# Patient Record
Sex: Male | Born: 1950
Health system: Southern US, Academic
[De-identification: ages and names within clinical notes are randomized; demographics above are authoritative.]

## PROBLEM LIST (undated history)

## (undated) ENCOUNTER — Encounter: Attending: Infectious Disease | Primary: Infectious Disease

## (undated) ENCOUNTER — Telehealth

## (undated) ENCOUNTER — Encounter: Attending: Nephrology | Primary: Nephrology

## (undated) ENCOUNTER — Encounter

## (undated) ENCOUNTER — Ambulatory Visit

## (undated) ENCOUNTER — Encounter: Attending: Cardiovascular Disease | Primary: Cardiovascular Disease

## (undated) ENCOUNTER — Ambulatory Visit: Payer: MEDICARE

## (undated) ENCOUNTER — Ambulatory Visit
Attending: Student in an Organized Health Care Education/Training Program | Primary: Student in an Organized Health Care Education/Training Program

## (undated) ENCOUNTER — Ambulatory Visit: Payer: Medicare (Managed Care) | Attending: Nephrology | Primary: Nephrology

## (undated) ENCOUNTER — Ambulatory Visit: Payer: Medicare (Managed Care)

## (undated) ENCOUNTER — Telehealth
Attending: Pharmacist Clinician (PhC)/ Clinical Pharmacy Specialist | Primary: Pharmacist Clinician (PhC)/ Clinical Pharmacy Specialist

## (undated) ENCOUNTER — Ambulatory Visit: Payer: MEDICARE | Attending: Nephrology | Primary: Nephrology

## (undated) ENCOUNTER — Ambulatory Visit: Attending: Infectious Disease | Primary: Infectious Disease

## (undated) ENCOUNTER — Encounter: Attending: Surgery | Primary: Surgery

## (undated) ENCOUNTER — Telehealth: Attending: Infectious Disease | Primary: Infectious Disease

## (undated) ENCOUNTER — Other Ambulatory Visit: Payer: Medicare (Managed Care)

## (undated) ENCOUNTER — Ambulatory Visit: Payer: MEDICARE | Attending: Infectious Disease | Primary: Infectious Disease

## (undated) ENCOUNTER — Ambulatory Visit: Attending: Nephrology | Primary: Nephrology

## (undated) ENCOUNTER — Ambulatory Visit
Payer: MEDICARE | Attending: Student in an Organized Health Care Education/Training Program | Primary: Student in an Organized Health Care Education/Training Program

## (undated) ENCOUNTER — Ambulatory Visit: Payer: MEDICARE | Attending: Cardiovascular Disease | Primary: Cardiovascular Disease

## (undated) ENCOUNTER — Ambulatory Visit: Payer: Medicare (Managed Care) | Attending: Infectious Disease | Primary: Infectious Disease

## (undated) ENCOUNTER — Encounter: Attending: Family | Primary: Family

## (undated) ENCOUNTER — Ambulatory Visit
Payer: Medicare (Managed Care) | Attending: Student in an Organized Health Care Education/Training Program | Primary: Student in an Organized Health Care Education/Training Program

## (undated) ENCOUNTER — Encounter: Payer: Medicare (Managed Care) | Attending: Infectious Disease | Primary: Infectious Disease

## (undated) DIAGNOSIS — I219 Acute myocardial infarction, unspecified: Secondary | ICD-10-CM

## (undated) DIAGNOSIS — I350 Nonrheumatic aortic (valve) stenosis: Secondary | ICD-10-CM

## (undated) DIAGNOSIS — K08109 Complete loss of teeth, unspecified cause, unspecified class: Secondary | ICD-10-CM

## (undated) DIAGNOSIS — Z972 Presence of dental prosthetic device (complete) (partial): Secondary | ICD-10-CM

## (undated) DIAGNOSIS — F32A Depression, unspecified: Secondary | ICD-10-CM

## (undated) DIAGNOSIS — E119 Type 2 diabetes mellitus without complications: Secondary | ICD-10-CM

## (undated) DIAGNOSIS — K219 Gastro-esophageal reflux disease without esophagitis: Secondary | ICD-10-CM

## (undated) DIAGNOSIS — Z21 Asymptomatic human immunodeficiency virus [HIV] infection status: Secondary | ICD-10-CM

## (undated) DIAGNOSIS — M5020 Other cervical disc displacement, unspecified cervical region: Secondary | ICD-10-CM

## (undated) DIAGNOSIS — D649 Anemia, unspecified: Secondary | ICD-10-CM

## (undated) DIAGNOSIS — G709 Myoneural disorder, unspecified: Secondary | ICD-10-CM

## (undated) DIAGNOSIS — F329 Major depressive disorder, single episode, unspecified: Secondary | ICD-10-CM

## (undated) DIAGNOSIS — R011 Cardiac murmur, unspecified: Secondary | ICD-10-CM

## (undated) DIAGNOSIS — B182 Chronic viral hepatitis C: Secondary | ICD-10-CM

## (undated) DIAGNOSIS — K7460 Unspecified cirrhosis of liver: Secondary | ICD-10-CM

## (undated) DIAGNOSIS — I509 Heart failure, unspecified: Secondary | ICD-10-CM

## (undated) DIAGNOSIS — I2510 Atherosclerotic heart disease of native coronary artery without angina pectoris: Secondary | ICD-10-CM

## (undated) DIAGNOSIS — B1920 Unspecified viral hepatitis C without hepatic coma: Secondary | ICD-10-CM

## (undated) DIAGNOSIS — I10 Essential (primary) hypertension: Secondary | ICD-10-CM

## (undated) DIAGNOSIS — N189 Chronic kidney disease, unspecified: Secondary | ICD-10-CM

## (undated) HISTORY — DX: Unspecified viral hepatitis C without hepatic coma: B19.20

## (undated) HISTORY — PX: FRACTURE SURGERY: SHX138

## (undated) HISTORY — PX: NEPHROSTOMY: SHX1014

## (undated) HISTORY — PX: CHOLECYSTECTOMY: SHX55

## (undated) HISTORY — PX: CARDIOVASCULAR STRESS TEST: SHX262

## (undated) HISTORY — DX: Asymptomatic human immunodeficiency virus (hiv) infection status: Z21

## (undated) HISTORY — PX: CLOSED REDUCTION PELVIC FRACTURE: SUR231

## (undated) HISTORY — DX: Nonrheumatic aortic (valve) stenosis: I35.0

## (undated) MED ORDER — BACLOFEN (BULK) TOP: TOPICAL | 0 days

## (undated) MED ORDER — CHOLECALCIFEROL (VITAMIN D3) 25 MCG (1,000 UNIT) TABLET: Freq: Every day | ORAL | 0.00000 days

---
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
## 1983-06-02 HISTORY — PX: BACK SURGERY: SHX140

## 1988-06-01 HISTORY — PX: OTHER SURGICAL HISTORY: SHX169

## 2004-11-25 ENCOUNTER — Emergency Department: Payer: Self-pay | Admitting: General Practice

## 2004-12-16 ENCOUNTER — Emergency Department: Payer: Self-pay | Admitting: Emergency Medicine

## 2005-03-22 ENCOUNTER — Emergency Department: Payer: Self-pay | Admitting: Emergency Medicine

## 2005-07-17 ENCOUNTER — Emergency Department: Payer: Self-pay | Admitting: Unknown Physician Specialty

## 2005-09-08 ENCOUNTER — Other Ambulatory Visit: Payer: Self-pay

## 2005-09-08 ENCOUNTER — Emergency Department: Payer: Self-pay | Admitting: Internal Medicine

## 2005-09-23 ENCOUNTER — Other Ambulatory Visit: Payer: Self-pay

## 2005-09-23 ENCOUNTER — Emergency Department: Payer: Self-pay | Admitting: General Practice

## 2005-12-20 ENCOUNTER — Emergency Department: Payer: Self-pay | Admitting: Emergency Medicine

## 2005-12-30 ENCOUNTER — Ambulatory Visit: Payer: Self-pay | Admitting: Specialist

## 2006-01-08 ENCOUNTER — Ambulatory Visit: Payer: Self-pay | Admitting: Specialist

## 2006-05-01 ENCOUNTER — Emergency Department: Payer: Self-pay | Admitting: Emergency Medicine

## 2006-09-06 ENCOUNTER — Emergency Department: Payer: Self-pay | Admitting: Emergency Medicine

## 2006-10-03 ENCOUNTER — Emergency Department: Payer: Self-pay | Admitting: Emergency Medicine

## 2006-12-29 ENCOUNTER — Emergency Department: Payer: Self-pay | Admitting: Emergency Medicine

## 2007-03-25 ENCOUNTER — Emergency Department: Payer: Self-pay | Admitting: Internal Medicine

## 2007-05-14 ENCOUNTER — Emergency Department: Payer: Self-pay | Admitting: Emergency Medicine

## 2007-06-02 HISTORY — PX: KNEE ARTHROSCOPY: SUR90

## 2007-07-10 ENCOUNTER — Emergency Department: Payer: Self-pay | Admitting: Emergency Medicine

## 2007-08-09 ENCOUNTER — Ambulatory Visit: Payer: Self-pay | Admitting: Internal Medicine

## 2007-09-25 ENCOUNTER — Emergency Department: Payer: Self-pay | Admitting: Emergency Medicine

## 2007-10-05 ENCOUNTER — Observation Stay: Payer: Self-pay | Admitting: Internal Medicine

## 2007-10-05 ENCOUNTER — Other Ambulatory Visit: Payer: Self-pay

## 2007-10-24 ENCOUNTER — Ambulatory Visit: Payer: Self-pay | Admitting: Rheumatology

## 2007-11-04 ENCOUNTER — Ambulatory Visit: Payer: Self-pay | Admitting: Unknown Physician Specialty

## 2007-11-29 ENCOUNTER — Ambulatory Visit: Payer: Self-pay | Admitting: Unknown Physician Specialty

## 2007-12-05 ENCOUNTER — Ambulatory Visit: Payer: Self-pay | Admitting: Unknown Physician Specialty

## 2008-02-14 ENCOUNTER — Emergency Department: Payer: Self-pay | Admitting: Emergency Medicine

## 2008-03-17 ENCOUNTER — Emergency Department: Payer: Self-pay | Admitting: Unknown Physician Specialty

## 2008-05-11 ENCOUNTER — Emergency Department: Payer: Self-pay | Admitting: Emergency Medicine

## 2008-05-23 ENCOUNTER — Ambulatory Visit: Payer: Self-pay | Admitting: Rheumatology

## 2008-08-05 ENCOUNTER — Emergency Department: Payer: Self-pay | Admitting: Emergency Medicine

## 2008-08-06 ENCOUNTER — Ambulatory Visit: Payer: Self-pay | Admitting: Rheumatology

## 2008-10-31 ENCOUNTER — Emergency Department: Payer: Self-pay | Admitting: Emergency Medicine

## 2008-12-02 ENCOUNTER — Emergency Department: Payer: Self-pay | Admitting: Emergency Medicine

## 2008-12-14 ENCOUNTER — Emergency Department: Payer: Self-pay | Admitting: Emergency Medicine

## 2009-01-21 ENCOUNTER — Emergency Department: Payer: Self-pay | Admitting: Unknown Physician Specialty

## 2009-09-27 ENCOUNTER — Emergency Department: Payer: Self-pay | Admitting: Emergency Medicine

## 2011-05-10 ENCOUNTER — Emergency Department: Payer: Self-pay | Admitting: *Deleted

## 2011-08-19 ENCOUNTER — Other Ambulatory Visit: Payer: Self-pay | Admitting: Neurosurgery

## 2011-08-31 ENCOUNTER — Encounter (HOSPITAL_COMMUNITY): Payer: Self-pay | Admitting: Pharmacy Technician

## 2011-09-03 ENCOUNTER — Encounter (HOSPITAL_COMMUNITY)
Admission: RE | Admit: 2011-09-03 | Discharge: 2011-09-03 | Disposition: A | Discharge status: Home / Self Care | Payer: BC Managed Care – PPO | Source: Ambulatory Visit | Attending: Neurosurgery | Admitting: Neurosurgery

## 2011-09-03 ENCOUNTER — Encounter (HOSPITAL_COMMUNITY): Payer: Self-pay

## 2011-09-03 DIAGNOSIS — Z538 Procedure and treatment not carried out for other reasons: Secondary | ICD-10-CM | POA: Insufficient documentation

## 2011-09-03 DIAGNOSIS — Z01812 Encounter for preprocedural laboratory examination: Secondary | ICD-10-CM | POA: Insufficient documentation

## 2011-09-03 HISTORY — DX: Essential (primary) hypertension: I10

## 2011-09-03 HISTORY — DX: Cardiac murmur, unspecified: R01.1

## 2011-09-03 LAB — BASIC METABOLIC PANEL
CO2: 28 mEq/L (ref 19–32)
Calcium: 9.1 mg/dL (ref 8.4–10.5)
Creatinine, Ser: 1.11 mg/dL (ref 0.50–1.35)
GFR calc Af Amer: 82 mL/min — ABNORMAL LOW (ref >90)
GFR calc non Af Amer: 71 mL/min — ABNORMAL LOW (ref >90)
Sodium: 139 mEq/L (ref 135–145)

## 2011-09-03 LAB — CBC
MCV: 88.3 fL (ref 78.0–100.0)
Platelets: 155 10*3/uL (ref 150–400)
RBC: 4.54 MIL/uL (ref 4.22–5.81)
RDW: 13.9 % (ref 11.5–15.5)
WBC: 7.7 10*3/uL (ref 4.0–10.5)

## 2011-09-03 LAB — SURGICAL PCR SCREEN: MRSA, PCR: NEGATIVE

## 2011-09-03 NOTE — Progress Notes (Addendum)
Requested stress test, CXR, EKG from [facility].   Per pt PCP is Dr. [name] Scot. Pt reports BP "runs high", he takes his medications as prescribed, watches salt intake, is working on BP control with his doctor.   @ 1253 Received stress test, EKG, CXR. All from 2009. Will need CXR and EKG DOS.

## 2011-09-03 NOTE — Pre-Procedure Instructions (Signed)
[place]  09/03/2011   Your procedure is scheduled on:  Thursday, April 11  Report to [facility] at 5:30 AM.  Call this number if you have problems the morning of surgery: [phone]   Remember:   Do not eat food:After Midnight.  May have clear liquids: up to 4 Hours before arrival.  Clear liquids include soda, tea, black coffee, apple or grape juice, broth.  Take these medicines the morning of surgery with A SIP OF WATER: Allopurinol, Atenolol, Flexeril    Do not wear jewelry, make-up or nail polish.  Do not wear lotions, powders, or perfumes. You may wear deodorant.  Do not shave 48 hours prior to surgery.  Do not bring valuables to the hospital.  Contacts, dentures or bridgework may not be worn into surgery.  Leave suitcase in the car. After surgery it may be brought to your room.  For patients admitted to the hospital, checkout time is 11:00 AM the day of discharge.   Patients discharged the day of surgery will not be allowed to drive home.  Name and phone number of your driver: NA  Special Instructions: CHG Shower Use Special Wash: 1/2 bottle night before surgery and 1/2 bottle morning of surgery.   Please read over the following fact sheets that you were given: Pain Booklet, Coughing and Deep Breathing and Surgical Site Infection Prevention

## 2011-09-03 NOTE — Progress Notes (Signed)
Left chart for anesthesia review - stress test, elevated BP.

## 2011-09-04 ENCOUNTER — Encounter (HOSPITAL_COMMUNITY): Payer: Self-pay | Admitting: Vascular Surgery

## 2011-09-04 NOTE — Consult Note (Addendum)
Anesthesia:  Patient is a 61 year old male scheduled for a left L4-5 microdiskectomy on 09/10/11.  History includes "benign" murmur (no history of echo), HTN, gout, former smoker.  He is a Firefighter.  His PCP is Dr. [name] Scot at [facility].    Vitals during his PAT showed an elevated BP of 190/100.  Unfortunately, it was not rechecked prior to him leaving.  Labs noted.  CXR and EKG requested from [facility] were > 75 year old.    He had a stress test at [facility] on 10/07/07 that showed normal myocardial perfusion without evidence of ischemia, normal LVEF of 65%.  His EKG then showed NSR, possible LAE, nonspecific T wave abnormality.  I called and spoke with Jared Walker.  His PCP is Dr. Ginette Pitman at [facility].  He says his HTN has been difficult to control, but doesn't think it has been quite as high as it was yesterday.  He is also unsure if he had taken all his antihypertensive medications that day.  (He is on Cardura 4mg  Q HS and atenolol 100mg  and lisinopril 40 mg daily.)  He denies CP, SOB, edema.  He is typically fairly active with his job, but recently sustained an back injury that is limiting his activity.  I've asked him to follow-up with Dr. Linton Ham office early next week to have his BP re-evaluated and treated as indicated.  I told him that he will need an EKG pre-operatively, and to see if one can be done at [facility] and sent to [place] pre-operatively.  If not, and EKG and CXR will need to be done on the day of surgery.  I updated Jared Walker at Dr. Melven Sartorius office.    Addendum: 09/09/11 1230  Patient called me on 09/07/11 to report a BP of 120/112 that was taken at a local drugstore.  I again instructed him to notify his BP since a significantly elevated systolic or diastolic BP reading would most likely lead to his surgery being canceled.  He was seen by Dr. Ginette Pitman on 09/08/11 and Hydralazine 50 mg TID was increased to 100 mg TID (Hydralinze was not actually listed on our medication list.)   He also had an EKG done that showed SR, possible septal infarct (age undetermined), flat or slightly negative T waves somewhat diffuse, but more notable in lateral leads.  Although still somewhat nonspecific, these changes appear new in I, aVL since his last EKG in 2009.  Dr. Ginette Pitman saw Jared Walker again today, 09/09/11, and BP was 176/92.  He recommended that his surgery be postponed until patient's BP was better controlled and an echocardiogram was done. [name] at Dr. Melven Sartorius office was notified.  Reviewed above and EKG with Anesthesiologist Dr. Oletta Lamas. Once cleared medically, plan to proceed.  Defer decision for any further Cardiac testing to Dr. Ginette Pitman based on his upcoming echo results.

## 2011-09-08 NOTE — Progress Notes (Addendum)
Requested latest office notes (from today, 4/9) and EKG from [facility]. Patient sees Dr. Ginette Pitman. Secretary states she will fax notes.

## 2011-09-10 ENCOUNTER — Ambulatory Visit (HOSPITAL_COMMUNITY): Admission: RE | Admit: 2011-09-10 | Payer: BC Managed Care – PPO | Source: Ambulatory Visit | Admitting: Neurosurgery

## 2011-09-10 ENCOUNTER — Encounter (HOSPITAL_COMMUNITY): Admission: RE | Payer: Self-pay | Source: Ambulatory Visit

## 2011-09-10 SURGERY — LUMBAR LAMINECTOMY/DECOMPRESSION MICRODISCECTOMY 1 LEVEL
Anesthesia: General | Laterality: Left

## 2011-09-23 ENCOUNTER — Other Ambulatory Visit: Payer: Self-pay | Admitting: Neurosurgery

## 2011-09-23 ENCOUNTER — Encounter (HOSPITAL_COMMUNITY): Payer: Self-pay | Admitting: Vascular Surgery

## 2011-09-23 NOTE — Consult Note (Signed)
Anesthesia Chart Review: Please refer to my initial consult note on 09/04/11.  He was initially scheduled for a left L4-5 microdiskectomy on 09/10/11, but was canceled due to uncontrolled HTN.  His PCP Dr. Ginette Pitman with [facility] recommended additional antihypertensive medication management and an echocardiogram preoperatively.  These have since been done.  His BP has still been difficult to control.  Pain is thought to be at least somewhat of a contributing factor.  Subsequently, Dr. Ginette Pitman mentioned consideration for admission the night before surgery.  Dr. Vertell Limber spoke with Anesthesiologist Dr. Al Corpus and faxed information from Dr. Ginette Pitman for his review.  History includes HTN, former smoker, gout.  Dr. Linton Ham note also mentions history of Hep C, HIV, hyperglycemia.  He also has mild AS by echo.  Echo from 09/14/11 showed normal LV systolic function, moderate-severe LVH, mild AI with mild calcific AS, mild MR with mild LAE, normal RV systolic function and pressures.  I called and spoke with Mr. Cherry today and reviewed his antihypertensive medications.  His taking atenolol 100mg  Q AM, Cardura 4mg  2 tablets Q HS, hydralazine 50mg  TID, clonidine 0.1mg  BID, lisinopril 40mg  Q AM.  He is taking oxycodone/APAP 5/325mg  BID PRN pain, and Valium and Flexeril at bedtime.  His BP today was 186/108.  By PCP notes, his BP has been ~200/110 but down to 170-180/100 when re-checked.  Of note, he is suppose to be taking hydralazine 100mg  TID according to Dr. Linton Ham noted.  I confirmed that with Dr. Ginette Pitman today and also instructed the patient on this.  I also alerted Dr. Ginette Pitman that patient is taking Cardura 8mg  at bedtime (versus 4mg  that is listed on his OV note).  Dr. Ginette Pitman was okay with this.    I reviewed above and the records faxed with Dr. Al Corpus.  At this point, he feels the patient could still come in as an out-patient but will need to take all his anti-hypertensives that morning (those that he would normally take in  the morning) including his ACEI and be compliant with his evening medications as well.  He was also instructed to take his pain medication that morning.  Anesthesia will defer decision to admit pre-operatively to Dr. Vertell Limber.  (I relayed this to Jeralyn Bennett at Dr. Melven Sartorius office.)   He will need a CXR and labs repeated pre-operatively.  I've asked the nurses to include a CMET and coags due to his history of Hep C (according to PCP records, as patient did not report this during his last PAT visit.)

## 2011-09-24 ENCOUNTER — Encounter (HOSPITAL_COMMUNITY): Payer: Self-pay | Admitting: *Deleted

## 2011-09-24 MED ORDER — CEFAZOLIN SODIUM-DEXTROSE 2-3 GM-% IV SOLR
2.0000 g | INTRAVENOUS | Status: DC
  Filled 2011-09-24: qty 50

## 2011-09-24 NOTE — Progress Notes (Signed)
I  Called Dr. Melven Sartorius office, on April 24th and asked scheduler for a consent  order- the one in computer was cancelled.

## 2011-09-25 ENCOUNTER — Encounter (HOSPITAL_COMMUNITY): Payer: Self-pay | Admitting: Vascular Surgery

## 2011-09-25 ENCOUNTER — Encounter (HOSPITAL_COMMUNITY): Payer: Self-pay | Admitting: *Deleted

## 2011-09-25 ENCOUNTER — Encounter (HOSPITAL_COMMUNITY)
Admission: RE | Disposition: A | Discharge status: Home Health | Payer: Self-pay | Source: Ambulatory Visit | Attending: Neurosurgery

## 2011-09-25 ENCOUNTER — Ambulatory Visit (HOSPITAL_COMMUNITY): Payer: BC Managed Care – PPO

## 2011-09-25 ENCOUNTER — Inpatient Hospital Stay (HOSPITAL_COMMUNITY)
Admission: RE | Admit: 2011-09-25 | Discharge: 2011-10-02 | DRG: 758 | Disposition: A | Discharge status: Home Health | Payer: BC Managed Care – PPO | Source: Ambulatory Visit | Attending: Neurosurgery | Admitting: Neurosurgery

## 2011-09-25 ENCOUNTER — Ambulatory Visit (HOSPITAL_COMMUNITY): Payer: BC Managed Care – PPO | Admitting: Vascular Surgery

## 2011-09-25 DIAGNOSIS — M5126 Other intervertebral disc displacement, lumbar region: Principal | ICD-10-CM | POA: Diagnosis present

## 2011-09-25 DIAGNOSIS — IMO0002 Reserved for concepts with insufficient information to code with codable children: Secondary | ICD-10-CM | POA: Diagnosis present

## 2011-09-25 DIAGNOSIS — Z21 Asymptomatic human immunodeficiency virus [HIV] infection status: Secondary | ICD-10-CM

## 2011-09-25 DIAGNOSIS — M47817 Spondylosis without myelopathy or radiculopathy, lumbosacral region: Secondary | ICD-10-CM | POA: Diagnosis present

## 2011-09-25 DIAGNOSIS — B1920 Unspecified viral hepatitis C without hepatic coma: Secondary | ICD-10-CM

## 2011-09-25 HISTORY — DX: Chronic kidney disease, unspecified: N18.9

## 2011-09-25 HISTORY — DX: Asymptomatic human immunodeficiency virus (hiv) infection status: Z21

## 2011-09-25 HISTORY — DX: Unspecified viral hepatitis C without hepatic coma: B19.20

## 2011-09-25 HISTORY — PX: LUMBAR LAMINECTOMY/DECOMPRESSION MICRODISCECTOMY: SHX5026

## 2011-09-25 LAB — CBC
MCH: 30.6 pg (ref 26.0–34.0)
MCHC: 35.3 g/dL (ref 30.0–36.0)
MCV: 86.9 fL (ref 78.0–100.0)
Platelets: 162 10*3/uL (ref 150–400)
RBC: 4.34 MIL/uL (ref 4.22–5.81)

## 2011-09-25 LAB — APTT: aPTT: 29 seconds (ref 24–37)

## 2011-09-25 LAB — COMPREHENSIVE METABOLIC PANEL
Alkaline Phosphatase: 93 U/L (ref 39–117)
BUN: 8 mg/dL (ref 6–23)
CO2: 24 mEq/L (ref 19–32)
GFR calc Af Amer: 76 mL/min — ABNORMAL LOW (ref >90)
GFR calc non Af Amer: 65 mL/min — ABNORMAL LOW (ref >90)
Glucose, Bld: 104 mg/dL — ABNORMAL HIGH (ref 70–99)
Potassium: 4 mEq/L (ref 3.5–5.1)
Total Protein: 7.1 g/dL (ref 6.0–8.3)

## 2011-09-25 LAB — SURGICAL PCR SCREEN: MRSA, PCR: NEGATIVE

## 2011-09-25 LAB — PROTIME-INR: Prothrombin Time: 13.9 seconds (ref 11.6–15.2)

## 2011-09-25 SURGERY — LUMBAR LAMINECTOMY/DECOMPRESSION MICRODISCECTOMY
Anesthesia: General | Site: Back | Laterality: Left | Wound class: Clean

## 2011-09-25 MED ORDER — HYDROCODONE-ACETAMINOPHEN 5-325 MG PO TABS: 1.0000 | ORAL_TABLET | ORAL | Status: DC | PRN

## 2011-09-25 MED ORDER — ONDANSETRON HCL 4 MG/2ML IJ SOLN: 4.0000 mg | Freq: Once | INTRAMUSCULAR | Status: DC | PRN

## 2011-09-25 MED ORDER — DIAZEPAM 5 MG PO TABS
5.0000 mg | ORAL_TABLET | Freq: Four times a day (QID) | ORAL | Status: DC | PRN
  Administered 2011-09-26 – 2011-10-01 (×8): 5 mg via ORAL
  Filled 2011-09-25 (×8): qty 1

## 2011-09-25 MED ORDER — HYDRALAZINE HCL 50 MG PO TABS
50.0000 mg | ORAL_TABLET | Freq: Three times a day (TID) | ORAL | Status: DC
  Administered 2011-09-25 – 2011-10-02 (×20): 50 mg via ORAL
  Filled 2011-09-25 (×23): qty 1

## 2011-09-25 MED ORDER — ATENOLOL 100 MG PO TABS
100.0000 mg | ORAL_TABLET | Freq: Every day | ORAL | Status: DC
  Administered 2011-09-26 – 2011-10-02 (×7): 100 mg via ORAL
  Filled 2011-09-25 (×7): qty 1

## 2011-09-25 MED ORDER — FENTANYL CITRATE 0.05 MG/ML IJ SOLN
INTRAMUSCULAR | Status: DC | PRN
  Administered 2011-09-25 (×3): 50 ug via INTRAVENOUS

## 2011-09-25 MED ORDER — SODIUM CHLORIDE 0.9 % IV SOLN
INTRAVENOUS | Status: AC
  Filled 2011-09-25: qty 500

## 2011-09-25 MED ORDER — PROPOFOL 10 MG/ML IV EMUL
INTRAVENOUS | Status: DC | PRN
  Administered 2011-09-25: 200 mg via INTRAVENOUS

## 2011-09-25 MED ORDER — HYDROMORPHONE HCL PF 1 MG/ML IJ SOLN
INTRAMUSCULAR | Status: AC
  Administered 2011-09-25: 0.5 mg via INTRAVENOUS
  Filled 2011-09-25: qty 1

## 2011-09-25 MED ORDER — MORPHINE SULFATE 10 MG/ML IJ SOLN: 0.0500 mg/kg | INTRAMUSCULAR | Status: DC | PRN

## 2011-09-25 MED ORDER — GLYCOPYRROLATE 0.2 MG/ML IJ SOLN
INTRAMUSCULAR | Status: DC | PRN
  Administered 2011-09-25: .4 mg via INTRAVENOUS

## 2011-09-25 MED ORDER — SODIUM CHLORIDE 0.9 % IJ SOLN
3.0000 mL | Freq: Two times a day (BID) | INTRAMUSCULAR | Status: DC
  Administered 2011-09-26 – 2011-10-02 (×13): 3 mL via INTRAVENOUS

## 2011-09-25 MED ORDER — ACETAMINOPHEN 325 MG PO TABS: 650.0000 mg | ORAL_TABLET | ORAL | Status: DC | PRN

## 2011-09-25 MED ORDER — BACITRACIN 50000 UNITS IM SOLR
INTRAMUSCULAR | Status: AC
  Filled 2011-09-25: qty 1

## 2011-09-25 MED ORDER — KCL IN DEXTROSE-NACL 20-5-0.45 MEQ/L-%-% IV SOLN
INTRAVENOUS | Status: DC
  Administered 2011-09-25: 22:00:00 via INTRAVENOUS
  Filled 2011-09-25 (×13): qty 1000

## 2011-09-25 MED ORDER — HYDROMORPHONE HCL PF 1 MG/ML IJ SOLN
0.2500 mg | INTRAMUSCULAR | Status: DC | PRN
  Administered 2011-09-25 (×4): 0.5 mg via INTRAVENOUS

## 2011-09-25 MED ORDER — LIDOCAINE HCL (CARDIAC) 20 MG/ML IV SOLN
INTRAVENOUS | Status: DC | PRN
  Administered 2011-09-25: 40 mg via INTRAVENOUS
  Administered 2011-09-25: 80 mg via INTRAVENOUS

## 2011-09-25 MED ORDER — SODIUM CHLORIDE 0.9 % IR SOLN
Status: DC | PRN
  Administered 2011-09-25: 16:00:00

## 2011-09-25 MED ORDER — PHENYLEPHRINE HCL 10 MG/ML IJ SOLN
INTRAMUSCULAR | Status: DC | PRN
  Administered 2011-09-25: 80 ug via INTRAVENOUS
  Administered 2011-09-25: 40 ug via INTRAVENOUS
  Administered 2011-09-25 (×7): 80 ug via INTRAVENOUS

## 2011-09-25 MED ORDER — METHYLPREDNISOLONE ACETATE 80 MG/ML IJ SUSP
INTRAMUSCULAR | Status: DC | PRN
  Administered 2011-09-25: 80 mg via INTRALESIONAL

## 2011-09-25 MED ORDER — CEFAZOLIN SODIUM 1-5 GM-% IV SOLN
INTRAVENOUS | Status: DC | PRN
  Administered 2011-09-25: 2 g via INTRAVENOUS

## 2011-09-25 MED ORDER — ONDANSETRON HCL 4 MG/2ML IJ SOLN
INTRAMUSCULAR | Status: DC | PRN
  Administered 2011-09-25: 4 mg via INTRAVENOUS

## 2011-09-25 MED ORDER — OXYCODONE-ACETAMINOPHEN 5-325 MG PO TABS
1.0000 | ORAL_TABLET | Freq: Two times a day (BID) | ORAL | Status: DC
  Administered 2011-09-25 – 2011-10-02 (×14): 1 via ORAL
  Filled 2011-09-25 (×3): qty 1
  Filled 2011-09-25: qty 2
  Filled 2011-09-25 (×12): qty 1

## 2011-09-25 MED ORDER — DOCUSATE SODIUM 100 MG PO CAPS
100.0000 mg | ORAL_CAPSULE | Freq: Two times a day (BID) | ORAL | Status: DC
  Administered 2011-09-25 – 2011-10-02 (×14): 100 mg via ORAL
  Filled 2011-09-25 (×12): qty 1

## 2011-09-25 MED ORDER — ALLOPURINOL 100 MG PO TABS
100.0000 mg | ORAL_TABLET | Freq: Every day | ORAL | Status: DC
  Administered 2011-09-26 – 2011-10-02 (×7): 100 mg via ORAL
  Filled 2011-09-25 (×7): qty 1

## 2011-09-25 MED ORDER — PHENOL 1.4 % MT LIQD: 1.0000 | OROMUCOSAL | Status: DC | PRN

## 2011-09-25 MED ORDER — ALLOPURINOL 300 MG PO TABS
300.0000 mg | ORAL_TABLET | Freq: Every day | ORAL | Status: DC
  Administered 2011-09-26 – 2011-10-02 (×7): 300 mg via ORAL
  Filled 2011-09-25 (×7): qty 1

## 2011-09-25 MED ORDER — DOXAZOSIN MESYLATE 8 MG PO TABS
8.0000 mg | ORAL_TABLET | Freq: Every day | ORAL | Status: DC
  Administered 2011-09-25 – 2011-10-01 (×7): 8 mg via ORAL
  Filled 2011-09-25 (×8): qty 1

## 2011-09-25 MED ORDER — LISINOPRIL 40 MG PO TABS
40.0000 mg | ORAL_TABLET | Freq: Every day | ORAL | Status: DC
Start: 2011-09-26 — End: 2011-10-02
  Administered 2011-09-26 – 2011-10-02 (×7): 40 mg via ORAL
  Filled 2011-09-25 (×7): qty 1

## 2011-09-25 MED ORDER — LACTATED RINGERS IV SOLN
INTRAVENOUS | Status: DC | PRN
  Administered 2011-09-25 (×2): via INTRAVENOUS

## 2011-09-25 MED ORDER — BUPIVACAINE HCL (PF) 0.5 % IJ SOLN
INTRAMUSCULAR | Status: DC | PRN
  Administered 2011-09-25: 10 mL

## 2011-09-25 MED ORDER — CEFAZOLIN SODIUM 1-5 GM-% IV SOLN
1.0000 g | Freq: Three times a day (TID) | INTRAVENOUS | Status: AC
  Administered 2011-09-25 – 2011-09-26 (×2): 1 g via INTRAVENOUS
  Filled 2011-09-25 (×2): qty 50

## 2011-09-25 MED ORDER — MIDAZOLAM HCL 5 MG/5ML IJ SOLN
INTRAMUSCULAR | Status: DC | PRN
  Administered 2011-09-25: 2 mg via INTRAVENOUS

## 2011-09-25 MED ORDER — LIDOCAINE-EPINEPHRINE 1 %-1:100000 IJ SOLN
INTRAMUSCULAR | Status: DC | PRN
  Administered 2011-09-25: 10 mL

## 2011-09-25 MED ORDER — FENTANYL CITRATE 0.05 MG/ML IJ SOLN
INTRAMUSCULAR | Status: AC
  Filled 2011-09-25: qty 2

## 2011-09-25 MED ORDER — DIAZEPAM 5 MG PO TABS: 5.0000 mg | ORAL_TABLET | Freq: Four times a day (QID) | ORAL | Status: DC | PRN

## 2011-09-25 MED ORDER — 0.9 % SODIUM CHLORIDE (POUR BTL) OPTIME
TOPICAL | Status: DC | PRN
  Administered 2011-09-25: 1000 mL

## 2011-09-25 MED ORDER — OXYCODONE-ACETAMINOPHEN 5-325 MG PO TABS
1.0000 | ORAL_TABLET | ORAL | Status: DC | PRN
  Administered 2011-09-26 (×3): 2 via ORAL
  Administered 2011-09-26: 1 via ORAL
  Administered 2011-09-27 – 2011-09-30 (×7): 2 via ORAL
  Filled 2011-09-25 (×3): qty 2
  Filled 2011-09-25: qty 1
  Filled 2011-09-25 (×6): qty 2

## 2011-09-25 MED ORDER — ONDANSETRON HCL 4 MG/2ML IJ SOLN: 4.0000 mg | INTRAMUSCULAR | Status: DC | PRN

## 2011-09-25 MED ORDER — FENTANYL CITRATE 0.05 MG/ML IJ SOLN
INTRAMUSCULAR | Status: DC | PRN
  Administered 2011-09-25: 100 ug via INTRAVENOUS

## 2011-09-25 MED ORDER — EFAVIRENZ-EMTRICITAB-TENOFOVIR 600-200-300 MG PO TABS
1.0000 | ORAL_TABLET | Freq: Every day | ORAL | Status: DC
  Administered 2011-09-25 – 2011-10-01 (×7): 1 via ORAL
  Filled 2011-09-25 (×8): qty 1

## 2011-09-25 MED ORDER — SODIUM CHLORIDE 0.9 % IV SOLN: 250.0000 mL | INTRAVENOUS | Status: DC

## 2011-09-25 MED ORDER — HEMOSTATIC AGENTS (NO CHARGE) OPTIME
TOPICAL | Status: DC | PRN
  Administered 2011-09-25: 1 via TOPICAL

## 2011-09-25 MED ORDER — MORPHINE SULFATE 4 MG/ML IJ SOLN
1.0000 mg | INTRAMUSCULAR | Status: DC | PRN
  Administered 2011-09-25: 2 mg via INTRAVENOUS
  Administered 2011-09-26 – 2011-09-29 (×7): 4 mg via INTRAVENOUS
  Filled 2011-09-25 (×8): qty 1

## 2011-09-25 MED ORDER — HYDROCODONE-ACETAMINOPHEN 5-325 MG PO TABS
1.0000 | ORAL_TABLET | ORAL | Status: DC | PRN
  Administered 2011-09-27 – 2011-10-01 (×4): 2 via ORAL
  Filled 2011-09-25 (×5): qty 2

## 2011-09-25 MED ORDER — MENTHOL 3 MG MT LOZG: 1.0000 | LOZENGE | OROMUCOSAL | Status: DC | PRN

## 2011-09-25 MED ORDER — ACETAMINOPHEN 650 MG RE SUPP: 650.0000 mg | RECTAL | Status: DC | PRN

## 2011-09-25 MED ORDER — CYCLOBENZAPRINE HCL 10 MG PO TABS
10.0000 mg | ORAL_TABLET | Freq: Every day | ORAL | Status: DC
  Administered 2011-09-25 – 2011-10-02 (×8): 10 mg via ORAL
  Filled 2011-09-25 (×12): qty 1

## 2011-09-25 MED ORDER — ROCURONIUM BROMIDE 100 MG/10ML IV SOLN
INTRAVENOUS | Status: DC | PRN
  Administered 2011-09-25: 50 mg via INTRAVENOUS

## 2011-09-25 MED ORDER — NEOSTIGMINE METHYLSULFATE 1 MG/ML IJ SOLN
INTRAMUSCULAR | Status: DC | PRN
  Administered 2011-09-25: 3 mg via INTRAVENOUS

## 2011-09-25 MED ORDER — SODIUM CHLORIDE 0.9 % IJ SOLN: 3.0000 mL | INTRAMUSCULAR | Status: DC | PRN

## 2011-09-25 MED ORDER — EPHEDRINE SULFATE 50 MG/ML IJ SOLN
INTRAMUSCULAR | Status: DC | PRN
  Administered 2011-09-25 (×4): 10 mg via INTRAVENOUS

## 2011-09-25 MED ORDER — MUPIROCIN 2 % EX OINT
TOPICAL_OINTMENT | CUTANEOUS | Status: AC
Start: 2011-09-25 — End: 2011-09-25
  Administered 2011-09-25: 1 via NASAL
  Filled 2011-09-25: qty 22

## 2011-09-25 MED ORDER — THROMBIN 5000 UNITS EX SOLR
CUTANEOUS | Status: DC | PRN
  Administered 2011-09-25 (×2): 5000 [IU] via TOPICAL

## 2011-09-25 SURGICAL SUPPLY — 56 items
BAG DECANTER FOR FLEXI CONT (MISCELLANEOUS) ×2 IMPLANT
BENZOIN TINCTURE PRP APPL 2/3 (GAUZE/BANDAGES/DRESSINGS) IMPLANT
BIT DRILL NEURO 2X3.1 SFT TUCH (MISCELLANEOUS) ×1 IMPLANT
BLADE SURG ROTATE 9660 (MISCELLANEOUS) IMPLANT
BUR ROUND FLUTED 5 RND (BURR) ×2 IMPLANT
CANISTER SUCTION 2500CC (MISCELLANEOUS) ×2 IMPLANT
CLOTH BEACON ORANGE TIMEOUT ST (SAFETY) ×2 IMPLANT
CONT SPEC 4OZ CLIKSEAL STRL BL (MISCELLANEOUS) ×2 IMPLANT
DERMABOND ADVANCED (GAUZE/BANDAGES/DRESSINGS) ×1
DERMABOND ADVANCED .7 DNX12 (GAUZE/BANDAGES/DRESSINGS) ×1 IMPLANT
DRAPE LAPAROTOMY 100X72X124 (DRAPES) ×2 IMPLANT
DRAPE MICROSCOPE LEICA (MISCELLANEOUS) ×2 IMPLANT
DRAPE POUCH INSTRU U-SHP 10X18 (DRAPES) ×2 IMPLANT
DRESSING TELFA 8X3 (GAUZE/BANDAGES/DRESSINGS) IMPLANT
DRILL NEURO 2X3.1 SOFT TOUCH (MISCELLANEOUS) ×2
DURAPREP 26ML APPLICATOR (WOUND CARE) ×2 IMPLANT
ELECT REM PT RETURN 9FT ADLT (ELECTROSURGICAL) ×2
ELECTRODE REM PT RTRN 9FT ADLT (ELECTROSURGICAL) ×1 IMPLANT
GAUZE SPONGE 4X4 16PLY XRAY LF (GAUZE/BANDAGES/DRESSINGS) IMPLANT
GLOVE BIO SURGEON STRL SZ8 (GLOVE) ×2 IMPLANT
GLOVE BIOGEL PI IND STRL 7.5 (GLOVE) ×1 IMPLANT
GLOVE BIOGEL PI IND STRL 8 (GLOVE) ×1 IMPLANT
GLOVE BIOGEL PI IND STRL 8.5 (GLOVE) ×2 IMPLANT
GLOVE BIOGEL PI INDICATOR 7.5 (GLOVE) ×1
GLOVE BIOGEL PI INDICATOR 8 (GLOVE) ×1
GLOVE BIOGEL PI INDICATOR 8.5 (GLOVE) ×2
GLOVE ECLIPSE 7.5 STRL STRAW (GLOVE) ×6 IMPLANT
GLOVE ECLIPSE 8.0 STRL XLNG CF (GLOVE) ×2 IMPLANT
GLOVE ECLIPSE 8.5 STRL (GLOVE) ×2 IMPLANT
GLOVE EXAM NITRILE LRG STRL (GLOVE) IMPLANT
GLOVE EXAM NITRILE MD LF STRL (GLOVE) IMPLANT
GLOVE EXAM NITRILE XL STR (GLOVE) IMPLANT
GLOVE EXAM NITRILE XS STR PU (GLOVE) IMPLANT
GOWN BRE IMP SLV AUR LG STRL (GOWN DISPOSABLE) IMPLANT
GOWN BRE IMP SLV AUR XL STRL (GOWN DISPOSABLE) ×4 IMPLANT
GOWN STRL REIN 2XL LVL4 (GOWN DISPOSABLE) ×2 IMPLANT
KIT BASIN OR (CUSTOM PROCEDURE TRAY) ×2 IMPLANT
KIT ROOM TURNOVER OR (KITS) ×2 IMPLANT
NEEDLE HYPO 18GX1.5 BLUNT FILL (NEEDLE) ×2 IMPLANT
NEEDLE HYPO 25X1 1.5 SAFETY (NEEDLE) ×2 IMPLANT
NS IRRIG 1000ML POUR BTL (IV SOLUTION) ×2 IMPLANT
PACK LAMINECTOMY NEURO (CUSTOM PROCEDURE TRAY) ×2 IMPLANT
PAD ARMBOARD 7.5X6 YLW CONV (MISCELLANEOUS) ×6 IMPLANT
SPONGE GAUZE 4X4 12PLY (GAUZE/BANDAGES/DRESSINGS) IMPLANT
SPONGE SURGIFOAM ABS GEL SZ50 (HEMOSTASIS) ×2 IMPLANT
STAPLER SKIN PROX WIDE 3.9 (STAPLE) IMPLANT
STRIP CLOSURE SKIN 1/2X4 (GAUZE/BANDAGES/DRESSINGS) IMPLANT
SUT VIC AB 0 CT1 18XCR BRD8 (SUTURE) ×1 IMPLANT
SUT VIC AB 0 CT1 8-18 (SUTURE) ×1
SUT VIC AB 2-0 CT1 18 (SUTURE) ×2 IMPLANT
SUT VIC AB 3-0 SH 8-18 (SUTURE) ×2 IMPLANT
SYR 20CC LL (SYRINGE) ×2 IMPLANT
SYR 5ML LL (SYRINGE) ×2 IMPLANT
TOWEL OR 17X24 6PK STRL BLUE (TOWEL DISPOSABLE) ×2 IMPLANT
TOWEL OR 17X26 10 PK STRL BLUE (TOWEL DISPOSABLE) ×2 IMPLANT
WATER STERILE IRR 1000ML POUR (IV SOLUTION) ×2 IMPLANT

## 2011-09-25 NOTE — Preoperative (Signed)
Beta Blockers   Reason not to administer Beta Blockers:Not Applicable 

## 2011-09-25 NOTE — H&P (Signed)
Less Chou [id number] DOB: 06-21-1950  08/19/2011: Ilda Basset comes in today. He has been using physical therapy and TENS Unit and traction for his low back. He has pain in the left side of his neck, down his left arm that he says began yesterday. He had an injection by Dr. Maryjean Ka on 3/14 which he said did not help. He has been taking Percocet 10/325 1 b.i.d. and Flexeril 5 mg. q.h.s. Refills of medications were written today of Flexeril 10 mg. q.8.h. as needed dispensed #90, Percocet 10/325 q.8.h.  I reviewed Mr. Sybrant imaging studies in detail with him and his wife. I believe the basis for his significant left leg pain relates to his disc herniation and stenosis at L4-5 on the left. At this point, since he did not get any significant relief with injection therapy, it is reasonable to proceed with surgical intervention and this will consist of left L4-5 foraminotomy and microdiskectomy. He wishes to proceed with surgery. We will set this up in the near future. Risks and benefits were discussed and he wishes to process.  Marchia Meiers. Vertell Limber, M.D./gde cc: Dr. 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 He describes left-sided low-back pain going into his left leg which he describes as 10/10 in severity. He says this has been going on for the last two months. He has a history of a right pelvic fracture with major reconstructive surgery some 25 years ago. He also has an extensive Past Medical History positive for gallstones, high blood pressure, as well as history of hepatitis-C, and HIV positive, nephrolithiasis, hyperglycemia and gout. In addition to pelvic fracture for which he had surgery, he had a right knee arthroscopy for a torn meniscus and gout. He had pain in the left leg  which did not improve with Meloxicam and Valium.  REVIEW OF SYSTEMS: A detailed Review of Systems sheet was reviewed with the patient. Pertinent positives include: Eyes: He wears glasses. Cardiovascular: high blood pressure, heart murmur, leg pain while walking. GU: kidney stones. Musculoskeletal: Leg weakness, back pain, leg pain.  PAST MEDICAL HISTORY:   Current Medical Conditions: He also has an extensive Past Medical History positive for gallstones, high blood pressure, as well as history of hepatitis-C, and HIV positive, nephrolithiasis, hyperglycemia and gout. In addition to pelvic fracture for which he had surgery, he had a right knee arthroscopy for a torn meniscus and gout. He had pain in the left leg which did not improve with Meloxicam and Valium.   Prior Operations and Hospitalizations: As previously described.   Medications and Allergies: He is currently taking Allopurinol 100 mg. 2 tablets by mouth q.d. as needed, Allopurinol 300 mg. q.d., Clonidine .1 mg. q.h.s., Colchicine .6 mg. q.o.d. as directed, Atenolol 100 mg. once q.d., Lisinopril 40 mg. once q.d., Doxazosin 4 mg. q.d., Valium 5 mg. q.8.h. p.r.n., Meloxicam 15 mg. one tablet by mouth every day with food. He notes allergies to Adalat-CC, Percocet which caused GI upset, Tramadol unspecified.   Height and Weight: 5'11" tall and 198 pounds.  FAMILY HISTORY: Both parents are deceased cause unspecified.  SOCIAL HISTORY: He currently is a nonsmoker and nondrinker. No history of substance abuse.  DIAGNOSTIC STUDIES: Mr. Zecher had an MRI of his lumbar spine which was performed through [facility] on 07/17/2011 which shows that  at L3-4 there is a disc bulge and moderate right and severe left neural foraminal stenosis. At L4-5, there appears to be moderate biforaminal stenosis and at L5-S1 level there appears to be ligamentous thickening and facet arthropathy resulting in mild central stenosis in light of moderate biforaminal stenosis.   PHYSICAL EXAMINATION:   General Appearance: Mr. Jaggers is a pleasant and cooperative man in no acute distress.   Blood Pressure, Pulse, Respirations: 130/80. Heart rate 72 and regular. Respirations 16.   HEENT - normocephalic, atraumatic. The pupils are equal, round and reactive to light. The extraocular muscles are intact. Sclerae - white. Conjunctiva - pink. Oropharynx benign. Uvula midline.   Neck - there are no masses, meningismus, deformities, tracheal deviation, jugular vein distention or carotid bruits. There is normal cervical range of motion. Spurlings' test is negative without reproducible radicular pain turning the patient's head to either side. Lhermitte's sign is not present with axial compression.   Respiratory - there is normal respiratory effort with good intercostal function. Lungs are clear to auscultation. There are no rales, rhonchi or wheezes.   Cardiovascular - the heart has regular rate and rhythm to auscultation. No murmurs are appreciated. There is no extremity edema, cyanosis or clubbing. There are palpable pedal pulses.   Abdomen - soft, nontender, no hepatosplenomegaly appreciated or masses. There are active bowel sounds. No guarding or rebound.   Musculoskeletal Examination - he has left sciatic notch discomfort. He gets relief with leaning forward. He is able to stand on his heels and toes. He has an antalgic gait favoring his left lower extremity. He has a positive straight leg raising on the left at 30 degrees.  NEUROLOGICAL EXAMINATION: The patient is oriented to time, person and place and has good recall of both recent and remote memory with normal attention span and concentration. The patient speaks with clear and fluent speech and exhibits normal language function and appropriate fund of knowledge.   Cranial Nerve Examination - pupils are equal, round and reactive to light. Extraocular movements are full. Visual fields are full to confrontational testing.  Facial sensation and facial movement are symmetric and intact. Hearing is intact to finger rub. Palate is upgoing. Shoulder shrug is symmetric. Tongue protrudes in the midline.   Motor Examination - motor strength is 5/5 in the bilateral deltoids, biceps, triceps, handgrips, wrist extensors, interosseous. In the lower extremities motor strength is 5/5 in hip flexion, extension, quadriceps, hamstrings, plantar flexion, dorsiflexion and extensor hallucis longus.   Sensory Examination - he notes increased pin sensation in the left L4 distribution.   Deep Tendon Reflexes - 2 in the biceps, triceps, and brachioradialis, 2 at the right knee, trace to the left, 2 in the right ankle and 1 at the left ankle. The great toes are downgoing to plantar stimulation.   Cerebellar Examination - normal coordination in upper and lower extremities and normal rapid alternating movements. Romberg test is negative.  IMPRESSION AND RECOMMENDATIONS: Yandriel Behm is a 61 year old man with low back and left leg pain. I have suggested that he come out of work, that he try to have a left L5 and L4 nerve blocks by Dr. Maryjean Ka and that he undergo a course of physical therapy through [facility] for low back pain with leg pain two times a week for six weeks. He will then come back to see me in a month. Hopefully with these interventions he will be able to avoid surgical intervention.  [facility]  Marchia Meiers. Vertell Limber, M.D. JDS:gde cc: Dr. Tracie Harrier  Jeralyn Bennett Surgical Scheduler [facility]  [place]. [address], [address] [place], [place] [address] [phone]

## 2011-09-25 NOTE — Op Note (Signed)
09/25/2011  5:19 PM  PATIENT:  Jared Walker  61 y.o. male  PRE-OPERATIVE DIAGNOSIS:  Lumbar hnp without myelopathy, Lumbar radiculopathy, lumbar spondylosis, DDD  POST-OPERATIVE DIAGNOSIS:   Lumbar hnp without myelopathy, Lumbar radiculopathy, lumbar spondylosis, DDD  PROCEDURE:  Procedure(s) (LRB): LUMBAR LAMINECTOMY/DECOMPRESSION MICRODISCECTOMY  L 4 -5 (Left)  SURGEON:  Surgeon(s) and Role:    * Erline Levine, MD - Primary    * Kristeen Miss, MD - Assisting  PHYSICIAN ASSISTANT:   ASSISTANTS: none   ANESTHESIA:   general  EBL:  Total I/O In: 1000 [I.V.:1000] Out: 120 [Blood:120]  BLOOD ADMINISTERED:none  DRAINS: none   LOCAL MEDICATIONS USED:  LIDOCAINE   SPECIMEN:  No Specimen  DISPOSITION OF SPECIMEN:  N/A  COUNTS:  YES  TOURNIQUET:  * No tourniquets in log *  DICTATION: DICTATION: Patient has a large L4-5 disc rupture on the left with significant leftt leg pain and weakness. It was elected to take him to surgery for left L4-5 microdiscectomy.  Procedure: Patient was brought to the operating room and following the smooth and uncomplicated induction of general endotracheal anesthesia he was placed in a prone position on the Wilson frame. Low back was prepped and draped in the usual sterile fashion with DuraPrep. Area of planned incision was infiltrated with local lidocaine. Incision was made in the midline and carried to the lumbodorsal fascia which was incised on the left side of midline. Subperiosteal dissection was performed exposing what was felt to be L45 level. Intraoperative x-ray demonstrated marker probes at L4-5. A hemi-semi-laminectomy of L4 was performed a high-speed drill and completed with Kerrison rongeurs and a generous foraminotomy was performed overlying the superior aspect of the L5 lamina. Ligamentum flavum was detached and removed in a piecemeal fashion and the L5 nerve root was decompressed laterally with removal of the superior aspect of the  facet and ligamentum causing nerve root compression. The microscope was brought into the field and the L5 nerve root was mobilized medially. This exposed a large amount of herniated disc material with disruption of the annulus. Multiple fragments were removed and these extended into the interspace which appeared to be quite soft with a disrupted annulus overlying the interspace. As a result it was elected to further decompress the interspace and remove loose disc material and this was done with a variety Epstein curettes and pituitary rongeurs. The redundant annulus was also removed with 2 mm Kerrison rongeur.  At this point it was felt that all neural elements were well decompressed and there was no evidence of residual loose disc material within the interspace. The interspace was then irrigated with bacitracin saline and no additional disc material was mobilized. Hemostasis was assured with bipolar electrocautery and the interspace was irrigated with Depo-Medrol and fentanyl. The lumbodorsal fascia was closed with 0 Vicryl sutures the subcutaneous tissues reapproximated 2-0 Vicryl inverted sutures and the skin edges were reapproximated with 3-0 Vicryl subcuticular stitch. The wound is dressed with Dermabond. Patient was extubated in the operating room and taken to recovery in stable and satisfactory condition having tolerated his operation well counts were correct at the end of the case.  PLAN OF CARE: Admit for overnight observation  PATIENT DISPOSITION:  PACU - hemodynamically stable.   Delay start of Pharmacological VTE agent (>24hrs) due to surgical blood loss or risk of bleeding: yes

## 2011-09-25 NOTE — Interval H&P Note (Signed)
History and Physical Interval Note:  09/25/2011 10:17 AM  Jared Walker  has presented today for surgery, with the diagnosis of Lumbar hnp without myelopathy, Lumbar radiculopathy  The various methods of treatment have been discussed with the patient and family. After consideration of risks, benefits and other options for treatment, the patient has consented to  Procedure(s) (LRB): LUMBAR LAMINECTOMY/DECOMPRESSION MICRODISCECTOMY (Left) as a surgical intervention .  The patients' history has been reviewed, patient examined, no change in status, stable for surgery.  I have reviewed the patients' chart and labs.  Questions were answered to the patient's satisfaction.     Jared Walker, Jared Walker  [id number] DOB:  1951-05-09 09/18/2011:  I spoke with Dr. Ginette Pitman from [facility] about his difficulty controlling Jared Walker blood pressure. Dr. Ginette Pitman currently has him on five drugs and has not been able to adequately complete control his blood pressure. He did do an echocardiogram which showed some left ventricular hypertrophy and otherwise no significant severe structural problems.  His echocardiogram results indicate that he has normal left ventricular systolic function, moderate to severe concentric LVH, mild aortic insufficiency, and mild calcific aortic stenosis, mild MR with mild left atrial enlargement and normal right ventricular systolic function and pressures.    After discussion with Dr. Ginette Pitman, we were of the opinion that Jared Walker is in a lot of pain and this is the reason that his blood pressure remains elevated and Dr. Ginette Pitman has him on five drugs.  I reviewed all of these documents and have reviewed them with Dr. Al Corpus from anesthesia at [facility] and Dr. Al Corpus feels that based on these findings that it will be reasonable to proceed with surgery and that upon getting some relief of his pain this should improve his blood pressure issues.  We will try to scan his documents  into Epic for Dr. Al Corpus' request and we will plan on proceeding with surgery in the near future.    Blood pressure as recorded by Dr. Linton Ham office were on 09/14/2011 - he had a blood pressure of 176/92 and then on the 17th he was upset, tearful, and in obvious pain with a blood pressure if 202/112, pulse of 90, and then repeat blood pressure was 180/100.           Marchia Meiers. Vertell Limber, M.D./aft  Date of Initial H&P: 09/18/2011  History reviewed, patient examined, no change in status, stable for surgery.

## 2011-09-25 NOTE — Anesthesia Postprocedure Evaluation (Signed)
Anesthesia Post Note  Patient: Jared Walker  Procedure(s) Performed: Procedure(s) (LRB): LUMBAR LAMINECTOMY/DECOMPRESSION MICRODISCECTOMY (Left)  Anesthesia type: General  Patient location: PACU  Post pain: Pain level controlled and Adequate analgesia  Post assessment: Post-op Vital signs reviewed, Patient's Cardiovascular Status Stable, Respiratory Function Stable, Patent Airway and Pain level controlled  Last Vitals:  Filed Vitals:   09/25/11 1731  BP: 174/90  Pulse: 93  Temp:   Resp: 17    Post vital signs: Reviewed and stable  Level of consciousness: awake, alert  and oriented  Complications: No apparent anesthesia complications

## 2011-09-25 NOTE — Anesthesia Preprocedure Evaluation (Addendum)
Anesthesia Evaluation  Patient identified by MRN, date of birth, ID band Patient awake    Reviewed: Allergy & Precautions, H&P , NPO status , Patient's Chart, lab work & pertinent test results, reviewed documented beta blocker date and time   Airway Mallampati: I TM Distance: >3 FB Neck ROM: Full    Dental  (+) Edentulous Upper, Edentulous Lower and Dental Advisory Given   Pulmonary  breath sounds clear to auscultation        Cardiovascular hypertension, Pt. on medications and Pt. on home beta blockers Rhythm:Regular Rate:Normal     Neuro/Psych    GI/Hepatic (+) Hepatitis -, C  Endo/Other    Renal/GU      Musculoskeletal   Abdominal   Peds  Hematology  (+) HIV,   Anesthesia Other Findings   Reproductive/Obstetrics                          Anesthesia Physical Anesthesia Plan  ASA: III  Anesthesia Plan: General   Post-op Pain Management:    Induction: Intravenous  Airway Management Planned: Oral ETT  Additional Equipment:   Intra-op Plan:   Post-operative Plan: Extubation in OR  Informed Consent: I have reviewed the patients History and Physical, chart, labs and discussed the procedure including the risks, benefits and alternatives for the proposed anesthesia with the patient or authorized representative who has indicated his/her understanding and acceptance.   Dental advisory given  Plan Discussed with: CRNA, Anesthesiologist and Surgeon  Anesthesia Plan Comments:         Anesthesia Quick Evaluation

## 2011-09-25 NOTE — Transfer of Care (Signed)
Immediate Anesthesia Transfer of Care Note  Patient: Jared Walker  Procedure(s) Performed: Procedure(s) (LRB): LUMBAR LAMINECTOMY/DECOMPRESSION MICRODISCECTOMY (Left)  Patient Location: PACU  Anesthesia Type: General  Level of Consciousness: awake, alert  and oriented  Airway & Oxygen Therapy: Patient Spontanous Breathing and Patient connected to nasal cannula oxygen  Post-op Assessment: Report given to PACU RN and Post -op Vital signs reviewed and stable  Post vital signs: Reviewed and stable  Complications: No apparent anesthesia complications

## 2011-09-26 ENCOUNTER — Inpatient Hospital Stay (HOSPITAL_COMMUNITY): Payer: BC Managed Care – PPO

## 2011-09-26 MED ORDER — DEXAMETHASONE SODIUM PHOSPHATE 4 MG/ML IJ SOLN
4.0000 mg | Freq: Four times a day (QID) | INTRAMUSCULAR | Status: AC
  Administered 2011-09-27 (×4): 4 mg via INTRAVENOUS
  Filled 2011-09-26 (×2): qty 1

## 2011-09-26 MED ORDER — SENNA 8.6 MG PO TABS
2.0000 | ORAL_TABLET | Freq: Every day | ORAL | Status: DC
  Administered 2011-09-26 – 2011-10-01 (×6): 17.2 mg via ORAL
  Filled 2011-09-26 (×7): qty 2

## 2011-09-26 MED ORDER — DEXAMETHASONE SODIUM PHOSPHATE 4 MG/ML IJ SOLN
6.0000 mg | Freq: Once | INTRAMUSCULAR | Status: AC
  Administered 2011-09-26: 6 mg via INTRAVENOUS
  Filled 2011-09-26: qty 2

## 2011-09-26 MED ORDER — DEXAMETHASONE SODIUM PHOSPHATE 4 MG/ML IJ SOLN
8.0000 mg | Freq: Once | INTRAMUSCULAR | Status: DC
Start: 2011-09-26 — End: 2011-09-26

## 2011-09-26 NOTE — Progress Notes (Signed)
I was called to see patient because of new right sided pain and increased back pain with ambulation.  His left leg is not bothering him.  He has full strength on confrontational testing.  Radiographs of L spine and pelvis were obtained which were not remarkable for acute pathology, but demonstrate retained hardware from his prior pelvis fracture.

## 2011-09-26 NOTE — Progress Notes (Signed)
Subjective: Patient reports back pain, but leg pain better  Objective: Vital signs in last 24 hours: Temp:  [97.6 F (36.4 C)-98.2 F (36.8 C)] 98.1 F (36.7 C) (04/27 0400) Pulse Rate:  [71-93] 85  (04/27 0400) Resp:  [12-21] 14  (04/27 0400) BP: (144-174)/(83-102) 155/83 mmHg (04/27 0400) SpO2:  [94 %-98 %] 94 % (04/27 0400) Weight:  [99.791 kg (220 lb)] 99.791 kg (220 lb) (04/26 1229)  Intake/Output from previous day: 04/26 0701 - 04/27 0700 In: 1300 [I.V.:1300] Out: 980 [Urine:860; Blood:120] Intake/Output this shift:    Physical Exam: Full strength bilateral lower extremities, incision CDI  Lab Results:  Basename 09/25/11 1210  WBC 8.3  HGB 13.3  HCT 37.7*  PLT 162   BMET  Basename 09/25/11 1210  NA 138  K 4.0  CL 104  CO2 24  GLUCOSE 104*  BUN 8  CREATININE 1.19  CALCIUM 9.2    Studies/Results: Dg Chest 2 View  09/25/2011  *RADIOLOGY REPORT*  Clinical Data: Preop respiratory exam for lumbar discectomy. Hypertension.  CHEST - 2 VIEW  Comparison: None.  Findings: Mild cardiomegaly is noted as well as tortuosity of the thoracic aorta.  Both lungs are clear.  No evidence of pleural effusion.  No mass or lymphadenopathy identified.  IMPRESSION: Mild cardiomegaly.  No active lung disease.  Original Report Authenticated By: Marlaine Hind, M.D.   Dg Lumbar Spine 1 View  09/25/2011  *RADIOLOGY REPORT*  Clinical Data: Lumbar laminectomy  LUMBAR SPINE - 1 VIEW  Comparison: None  Findings: Single lateral portable radiograph of the lumbar spine was obtained.  There are tissue spreaders identified posterior to the L5 vertebra.  There is a surgical probe with tip posterior to the L5 superior endplate.  This probe is directed towards the L4-5 disc space.  Hardware from prior fixation of the pelvis is identified.  IMPRESSION:  1.  The surgical probe is posterior to the superior endplate of the L5 vertebra.  Original Report Authenticated By: Angelita Ingles, M.D.     Assessment/Plan: Transfer to 3000.  PT to start today to help with mobility.  Reassured patient.  Will continue to monitor BP.    LOS: 1 day    Peggyann Shoals, MD 09/26/2011, 8:31 AM

## 2011-09-26 NOTE — Progress Notes (Signed)
PT. UP TO BATHROOM FROM THE PACU STRETCHER AND UNABLE TO VOID AFTER TRYING FOR OVER 20 MINUTES.  PT. BACK UP TO THE BATHROOM TWO MORE TIMES AND STILL UNABLE TO VOID AND VOICED THE NEED TO VOID.  BLADDER SCAN AT 2047 SHOWED 400CC APPROXIMATELY.  PT. ONCE AGAIN WANTED TO TRY TO VOID BUT UNABLE.  IN & OUT DONE AT 2116 WITH 510CC OF URINE RECEIVED. PT. TOLERATED PROCEDURE WELL.  [employer] [employer] RN

## 2011-09-27 NOTE — Progress Notes (Signed)
Patient ID: Jared Walker, male   DOB: 1950/08/10, 61 y.o.   MRN: [id number] Patient's awake alert ambulating albeit very slowly. His incision is clean and dry and he demonstrates good motor function is lower extremities. However his capacity for any distance is limited. Is walking about 15 feet just barely outside his room.  Status post decompression L4-L5 left postoperative day 2. Encourage ambulation encourage mobilization in preparation for discharge in a.m.

## 2011-09-27 NOTE — Evaluation (Signed)
Physical Therapy Evaluation Patient Details Name: Jared Walker MRN: [id number] DOB: 1950-08-15 Today's Date: 09/27/2011 Time:  -     PT Assessment / Plan / Recommendation Clinical Impression  Pt presents s/p laminectomy/microdiscecetomy L4-5. Pt will benefit from skilled PT in the acute care setting in order to maximize functional mobility and increase strength prior to d/c    PT Assessment  Patient needs continued PT services    Follow Up Recommendations  Home health PT;Supervision/Assistance - 24 hour    Equipment Recommendations  Rolling walker with 5" wheels    Frequency Min 5X/week    Precautions / Restrictions Precautions Precautions: Back Precaution Booklet Issued: Yes (comment) Precaution Comments: pt educated on 3/3 back precautions Restrictions Weight Bearing Restrictions: No   Pertinent Vitals/Pain Pt with 10/10 pain at end of session. RN notified for pain med      Mobility  Bed Mobility Bed Mobility: Sit to Supine Sit to Supine: 3: Mod assist;With rail Details for Bed Mobility Assistance: Mod assist for control secondary to increased pain during movement. Assist with LEs and trunk. VC for sequencing to maintain back precautions Transfers Transfers: Stand to Sit Stand to Sit: 4: Min assist;With upper extremity assist;To bed Details for Transfer Assistance: VC for hand placement and sequencing to avoid bending when sitting Ambulation/Gait Ambulation/Gait Assistance: 3: Mod assist Ambulation Distance (Feet): 30 Feet Assistive device: Rolling walker Ambulation/Gait Assistance Details: Mod assist for support during ambulation secondary to pain. VC for safe distance to RW for increased stability during ambulation Gait Pattern: Step-to pattern;Decreased hip/knee flexion - left;Decreased hip/knee flexion - right;Antalgic;Trunk flexed (increased trunk flexion, pt unable to straighten) Gait velocity: decreased gait speed, large steps        PT Goals Acute  Rehab PT Goals PT Goal Formulation: With patient Time For Goal Achievement: 10/04/11 Potential to Achieve Goals: Good Pt will Roll Supine to Right Side: with modified independence PT Goal: Rolling Supine to Right Side - Progress: Goal set today Pt will Roll Supine to Left Side: with modified independence PT Goal: Rolling Supine to Left Side - Progress: Goal set today Pt will go Supine/Side to Sit: with modified independence PT Goal: Supine/Side to Sit - Progress: Goal set today Pt will go Sit to Supine/Side: with modified independence PT Goal: Sit to Supine/Side - Progress: Goal set today Pt will go Sit to Stand: with supervision PT Goal: Sit to Stand - Progress: Goal set today Pt will go Stand to Sit: with supervision PT Goal: Stand to Sit - Progress: Goal set today Pt will Transfer Bed to Chair/Chair to Bed: with supervision PT Transfer Goal: Bed to Chair/Chair to Bed - Progress: Goal set today Pt will Ambulate: 51 - 150 feet;with supervision;with least restrictive assistive device PT Goal: Ambulate - Progress: Goal set today Pt will Go Up / Down Stairs: 3-5 stairs;with supervision;with rail(s) PT Goal: Up/Down Stairs - Progress: Goal set today  Visit Information  Last PT Received On: 09/27/11 Assistance Needed: +1    Subjective Data  Patient Stated Goal: go back to being independent   Prior Functioning  Home Living Lives With: Spouse Available Help at Discharge: Family;Available 24 hours/day Type of Home: House Home Access: Stairs to enter [employer] of Steps: 3 Entrance Stairs-Rails: Left Home Layout: One level Bathroom Shower/Tub: Walk-in shower;Door [employer] Toilet: Standard Bathroom Accessibility: Yes How Accessible: Accessible via walker Home Adaptive Equipment: Straight cane;Grab bars in shower Prior Function Level of Independence: Independent with assistive device(s) (cane(used all the time the last  month)) Able to Take Stairs?: Yes Driving:  Yes Vocation: On disability 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, too much walking with back pain Communication Communication: No difficulties Dominant Hand: Right    Cognition  Overall Cognitive Status: Appears within functional limits for tasks assessed/performed Arousal/Alertness: Awake/alert Orientation Level: Oriented X4 / Intact Behavior During Session: [facility] for tasks performed    Extremity/Trunk Assessment Right Lower Extremity Assessment RLE ROM/Strength/Tone: Deficits RLE ROM/Strength/Tone Deficits: MMT overall 4/5 RLE Sensation: WFL - Light Touch Left Lower Extremity Assessment LLE ROM/Strength/Tone: Deficits LLE ROM/Strength/Tone Deficits: MMT overall 4/5 LLE Sensation: WFL - Light Touch      End of Session PT - End of Session Equipment Utilized During Treatment: Gait belt Activity Tolerance: Patient limited by pain Patient left: in bed;with call bell/phone within reach Nurse Communication: Mobility status   Ambrose Finland 09/27/2011, 1:33 PM  09/27/2011 Ambrose Finland DPT PAGER: [phone] OFFICE: [phone]

## 2011-09-28 ENCOUNTER — Encounter (HOSPITAL_COMMUNITY): Payer: Self-pay | Admitting: Neurosurgery

## 2011-09-28 NOTE — Progress Notes (Signed)
09/28/2011 Jacqualyn Posey PTA [phone] pager [phone] office

## 2011-09-28 NOTE — Progress Notes (Signed)
Subjective: Patient reports still painful in back, hard to get up and sit down, no leg pain.  Objective: Vital signs in last 24 hours: Temp:  [98.4 F (36.9 C)-98.9 F (37.2 C)] 98.4 F (36.9 C) (04/29 0648) Pulse Rate:  [78-96] 79  (04/29 0648) Resp:  [20] 20  (04/29 0648) BP: (155-181)/(80-98) 176/98 mmHg (04/29 0648) SpO2:  [94 %-98 %] 97 % (04/29 0648)  Intake/Output from previous day: 04/28 0701 - 04/29 0700 In: -  Out: 1000 [Urine:1000] Intake/Output this shift:    Physical Exam: Sitting in chair.  No discomfort.  Lab Results:  Basename 09/25/11 1210  WBC 8.3  HGB 13.3  HCT 37.7*  PLT 162   BMET  Basename 09/25/11 1210  NA 138  K 4.0  CL 104  CO2 24  GLUCOSE 104*  BUN 8  CREATININE 1.19  CALCIUM 9.2    Studies/Results: Dg Lumbar Spine 2-3 Views  09/26/2011  *RADIOLOGY REPORT*  Clinical Data: Weakness  LUMBAR SPINE - 2-3 VIEW  Comparison: Yesterday  Findings: Anatomic alignment.  No vertebral body height loss. Hardware in the right side of the pelvis is stable.  Mild narrowing of the L4-5 disc.  IMPRESSION: No acute bony pathology.  Original Report Authenticated By: Jamas Lav, M.D.   Dg Pelvis 1-2 Views  09/26/2011  *RADIOLOGY REPORT*  Clinical Data: Weakness  PELVIS - 1-2 VIEW  Comparison: None.  Findings: Metallic hardware is in place within the right side of the pelvis.  Osteopenia.  No acute fracture and no dislocation.  IMPRESSION: No acute bony pathology.  Chronic and postoperative changes.  Original Report Authenticated By: Jamas Lav, M.D.    Assessment/Plan: Continue PT to work on patient mobility.    LOS: 3 days    Peggyann Shoals, MD 09/28/2011, 7:38 AM

## 2011-09-28 NOTE — Progress Notes (Signed)
Physical Therapy Treatment Patient Details Name: Jared Walker MRN: [id number] DOB: [birth year] Today's Date: 09/28/2011 Time: 0826-0902 PT Time Calculation (min): 36 min  PT Assessment / Plan / Recommendation Comments on Treatment Session  Pt increased amb distance despite increase in pain. Pt with decreased gait velocity. Pt need constant VC to upright posture during amb. VC for gait sequencing during turns to maintain back precaustions. Educated pt and wife on safety and changing position often. Progress to stairs next treatment      Follow Up Recommendations  Home health PT;Supervision/Assistance - 24 hour    Equipment Recommendations  Rolling walker with 5" wheels    Frequency Min 5X/week   Plan Discharge plan remains appropriate;Frequency remains appropriate    Precautions / Restrictions Precautions Precautions: Back;Fall Precaution Comments: pt was able to recall 3/3 precaustions  Restrictions Weight Bearing Restrictions: No   Pertinent Vitals/Pain Pain increased from 7/10 to 10 with amb. residual pain in L hip     Mobility  Transfers Transfers: Sit to Stand;Stand to Sit Sit to Stand: 5: Supervision;With upper extremity assist;With armrests;From chair/3-in-1 Stand to Sit: 4: Min guard;With upper extremity assist;To chair/3-in-1 Details for Transfer Assistance: VC for hand placement/ upright posture and assistance to control descent during sitting.  Ambulation/Gait Ambulation/Gait Assistance: 4: Min guard Ambulation Distance (Feet): 85 Feet Assistive device: Rolling walker Ambulation/Gait Assistance Details: VC for hand placement and upright posture. Min assist to support trunk due to fatigue during amb Gait Pattern: Step-to pattern;Decreased stride length;Trunk flexed    Exercises     PT Goals Acute Rehab PT Goals PT Goal: Sit to Stand - Progress: Met PT Goal: Stand to Sit - Progress: Progressing toward goal PT Goal: Ambulate - Progress: Progressing toward  goal  Visit Information  Last PT Received On: 09/28/11    Subjective Data      Cognition  Overall Cognitive Status: Appears within functional limits for tasks assessed/performed Arousal/Alertness: Awake/alert Orientation Level: Appears intact for tasks assessed Behavior During Session: [facility] for tasks performed    Balance     End of Session PT - End of Session Equipment Utilized During Treatment: Gait belt Activity Tolerance: Patient limited by fatigue;Patient tolerated treatment well Patient left: in chair;with call bell/phone within reach;with family/visitor present Nurse Communication: Mobility status    Jared Walker 09/28/2011, 9:40 AM

## 2011-09-28 NOTE — Progress Notes (Signed)
Patient Jared Walker, 61 year old African American male is struggling with back problems (disc).  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  Patient and his wife thanked Clinical biochemist for providing pastoral presence, prayer, and conversation.  I will follow-up as needed.

## 2011-09-29 MED ORDER — HYDROMORPHONE HCL 2 MG PO TABS
2.0000 mg | ORAL_TABLET | ORAL | Status: DC | PRN
  Administered 2011-09-29 – 2011-10-02 (×10): 4 mg via ORAL
  Filled 2011-09-29 (×10): qty 2

## 2011-09-29 NOTE — Progress Notes (Signed)
Patient Jared Walker, 61 year old African American male Jared Walker from [place] continues to struggle with back problems.  But patient enjoys the emotional support of his wife, and his Jared Walker.  Patient thanked Clinical biochemist for providing pastoral presence, prayer, and conversation.  I will follow-up as needed.

## 2011-09-29 NOTE — Progress Notes (Signed)
Utilization review completed. Rozanna Boer, RN, BSN.  09/29/11

## 2011-09-29 NOTE — Progress Notes (Signed)
09/29/2011 Jacqualyn Posey PTA [phone] pager [phone] office

## 2011-09-29 NOTE — Progress Notes (Signed)
Subjective: Patient reports "I was doing good until last night. i'm having a lot pf pain on that left side again."  Objective: Vital signs in last 24 hours: Temp:  [98 F (36.7 C)-98.4 F (36.9 C)] 98.2 F (36.8 C) (04/30 0600) Pulse Rate:  [71-86] 82  (04/30 0600) Resp:  [18-20] 18  (04/30 0600) BP: (136-193)/(85-111) 148/97 mmHg (04/30 0600) SpO2:  [97 %] 97 % (04/30 0600)  Intake/Output from previous day: 04/29 0701 - 04/30 0700 In: 480 [P.O.:480] Out: -  Intake/Output this shift:    Alert, conversant. good strength BLE. Nursing reports slow ambulation. Pt reports recurrence of left lumbar pain last pm & persisting this am. dermabond intact. No erythema, swelling, or drainage.   Lab Results: No results found for this basename: WBC:2,HGB:2,HCT:2,PLT:2 in the last 72 hours BMET No results found for this basename: NA:2,K:2,CL:2,CO2:2,GLUCOSE:2,BUN:2,CREATININE:2,CALCIUM:2 in the last 72 hours  Studies/Results: No results found.  Assessment/Plan: Stable   LOS: 4 days  Continue to mobilize. Per Dr. Vertell Limber, add Dilaudid p.o. to prn meds. Order entered.   Verdis Prime 09/29/2011, 9:23 AM

## 2011-09-29 NOTE — Plan of Care (Signed)
Problem: Phase III Progression Outcomes Goal: Demonstrates donning/doffing brace Outcome: Not Applicable Date Met:  [id number] No brace

## 2011-09-29 NOTE — Progress Notes (Signed)
PT Cancellation Note  Treatment cancelled today due to patient's refusal to participate. Pt politely cancelled/delayed treatement this am due to pain. On second attempt patient stated he amb with staff and cancelled treatment until tomorrow. Will attempt tomorrow when appropriate.      [name] 09/29/2011, 1:43 PM

## 2011-09-30 MED ORDER — HYDROMORPHONE HCL 2 MG PO TABS: 2.0000 mg | ORAL_TABLET | ORAL | Status: AC | PRN

## 2011-09-30 MED ORDER — DIAZEPAM 5 MG PO TABS
5.0000 mg | ORAL_TABLET | Freq: Four times a day (QID) | ORAL | Status: AC | PRN
Start: 2011-09-30 — End: 2011-10-10

## 2011-09-30 NOTE — Progress Notes (Signed)
Physical Therapy Treatment Patient Details Name: Jared Walker MRN: [id number] DOB: February 05, 1951 Today's Date: 09/30/2011 Time: [id number] PT Time Calculation (min): 51 min  PT Assessment / Plan / Recommendation Comments on Treatment Session  Pt was able to increase amb distance but required increase time and constant VC's/assistance to perform task. Pt was not able to follow commands for prolong period. Pt is at a high risk of falls due to decrease gait velocity and fear of pain with movement. Pt was educated/performed stairs but needs more practice to ensure safety at home. Educated pt/wife on all functional activities to improve saftey before going home.     Follow Up Recommendations       Equipment Recommendations       Frequency Min 5X/week   Plan      Precautions / Restrictions Precautions Precautions: Back;Fall Precaution Comments: nurse [name] stated he had poor back posture during bed mobility this am, pt was educated on back precaustions. Restrictions Weight Bearing Restrictions: No   Pertinent Vitals/Pain Pt's pain increased 8/10 to 10/10 with activity.      Mobility  Bed Mobility Bed Mobility: Not assessed Transfers Transfers: Sit to Stand;Stand to Sit Sit to Stand: 4: Min assist;With upper extremity assist;From bed;From chair/3-in-1;With armrests Stand to Sit: 4: Min guard;With armrests;To bed;To chair/3-in-1;With upper extremity assist Details for Transfer Assistance: Pt required VC's for hand placement and assistance to upright posture during descent.  Ambulation/Gait Ambulation/Gait Assistance: 4: Min assist Ambulation Distance (Feet): 100 Feet Assistive device: Rolling walker Ambulation/Gait Assistance Details: Pt required constant VC's and assistance to perform task. Assistance with RW placment and VC's for gait sequencing and upright posture. Gait Pattern: Step-to pattern;Decreased stride length;Trunk flexed Stairs: Yes Stairs Assistance: 4: Min  guard Stair Management Technique: Two rails Number of Stairs: 4  (2x2 in gym 3rd floor ) Wheelchair Mobility Wheelchair Mobility: No    Exercises     PT Goals Acute Rehab PT Goals PT Goal: Sit to Stand - Progress: Progressing toward goal PT Goal: Stand to Sit - Progress: Progressing toward goal PT Goal: Ambulate - Progress: Progressing toward goal PT Goal: Up/Down Stairs - Progress: Progressing toward goal  Visit Information  Last PT Received On: 09/30/11    Subjective Data  Subjective: Pt stated that he mentally ready to go home but not physically   Cognition  Overall Cognitive Status: Appears within functional limits for tasks assessed/performed Arousal/Alertness: Lethargic (due to meds before treatment ) Behavior During Session: [facility] for tasks performed    Balance     End of Session PT - End of Session Equipment Utilized During Treatment: Gait belt Activity Tolerance: Patient tolerated treatment well;Patient limited by pain Patient left: in chair;with call bell/phone within reach Nurse Communication: Mobility status    Braulio Conte 09/30/2011, 10:06 AM

## 2011-09-30 NOTE — Discharge Summary (Signed)
Physician Discharge Summary  Patient ID: Jared Walker MRN: [id number] DOB/AGE: 1950-10-10 61 y.o.  Admit date: 09/25/2011 Discharge date: 09/30/2011  Admission Diagnoses:Lumbar disc herniation with spondylosis  Discharge Diagnoses: Same Active Problems:  * No active hospital problems. *    Discharged Condition: good  Hospital Course: Improved left leg pain after surgery, but with persistent axial back pain, which has gradually resolved  Consults: None  Significant Diagnostic Studies: radiology: X-Ray: Lumbar radiographs OK  Treatments: surgery: Uncomplicated left [id number] microdiscectomy  Discharge Exam: Blood pressure 163/89, pulse 97, temperature 98.4 F (36.9 C), temperature source Oral, resp. rate 18, height 5\' 11"  (1.803 m), weight 99.791 kg (220 lb), SpO2 97.00%. Neurologic: Alert and oriented X 3, normal strength and tone. Normal symmetric reflexes. Normal coordination and gait Wound:CDI  Disposition: Home   Medication List  As of 09/30/2011  8:17 AM   TAKE these medications         allopurinol 300 MG tablet   Commonly known as: ZYLOPRIM   Take 300 mg by mouth daily.      allopurinol 100 MG tablet   Commonly known as: ZYLOPRIM   Take 100 mg by mouth daily.      atenolol 100 MG tablet   Commonly known as: TENORMIN   Take 100 mg by mouth daily.      cyclobenzaprine 10 MG tablet   Commonly known as: FLEXERIL   Take 10 mg by mouth daily.      diazepam 5 MG tablet   Commonly known as: VALIUM   Take 5 mg by mouth at bedtime as needed. For sleep      diazepam 5 MG tablet   Commonly known as: VALIUM   Take 1 tablet (5 mg total) by mouth every 6 (six) hours as needed.      doxazosin 4 MG tablet   Commonly known as: CARDURA   Take 8 mg by mouth at bedtime.      efavirenz-emtrictabine-tenofovir 600-200-300 MG per tablet   Commonly known as: ATRIPLA   Take 1 tablet by mouth at bedtime.      hydrALAZINE 50 MG tablet   Commonly known as: APRESOLINE   Take  50 mg by mouth 3 (three) times daily.      HYDROcodone-acetaminophen 5-500 MG per tablet   Commonly known as: VICODIN   Take 1 tablet by mouth 2 (two) times daily as needed. For pain      HYDROmorphone 2 MG tablet   Commonly known as: DILAUDID   Take 1-2 tablets (2-4 mg total) by mouth every 4 (four) hours as needed.      lisinopril 40 MG tablet   Commonly known as: PRINIVIL,ZESTRIL   Take 40 mg by mouth daily.      oxyCODONE-acetaminophen 5-325 MG per tablet   Commonly known as: PERCOCET   Take 1 tablet by mouth 2 (two) times daily.             Signed: Peggyann Shoals, MD 09/30/2011, 8:17 AM

## 2011-09-30 NOTE — Progress Notes (Signed)
Subjective: Patient reports doing better today  Objective: Vital signs in last 24 hours: Temp:  [97.4 F (36.3 C)-98.8 F (37.1 C)] 98.4 F (36.9 C) (05/01 [id number]) Pulse Rate:  [76-97] 97  (05/01 0632) Resp:  [18] 18  (05/01 0632) BP: (153-187)/(81-105) 163/89 mmHg (05/01 0632) SpO2:  [94 %-97 %] 97 % (05/01 [id number])  Intake/Output from previous day: 04/30 0701 - 05/01 0700 In: 600 [P.O.:600] Out: 200 [Urine:200] Intake/Output this shift:    Physical Exam: Able to ambulate with significantly less pain  Lab Results: No results found for this basename: WBC:2,HGB:2,HCT:2,PLT:2 in the last 72 hours BMET No results found for this basename: NA:2,K:2,CL:2,CO2:2,GLUCOSE:2,BUN:2,CREATININE:2,CALCIUM:2 in the last 72 hours  Studies/Results: No results found.  Assessment/Plan: D/C home.  FU in office 3 weeks.    LOS: 5 days    Peggyann Shoals, MD 09/30/2011, 7:55 AM

## 2011-09-30 NOTE — Progress Notes (Signed)
09/30/2011 Jacqualyn Posey PTA [phone] pager [phone] office

## 2011-10-01 NOTE — Progress Notes (Signed)
Subjective: Patient reports "I feel better. They had me walk on the stairs."  Objective: Vital signs in last 24 hours: Temp:  [98.3 F (36.8 C)-99.6 F (37.6 C)] 99.3 F (37.4 C) (05/02 0600) Pulse Rate:  [78-104] 104  (05/02 0600) Resp:  [18] 18  (05/02 0600) BP: (132-194)/(80-99) 148/85 mmHg (05/02 0600) SpO2:  [92 %-97 %] 93 % (05/02 0600)  Intake/Output from previous day: 05/01 0701 - 05/02 0700 In: -  Out: 701 [Urine:700; Stool:1] Intake/Output this shift:    Alert, conversant. Pain well controlled. Good strength BLE. Incision without erythema, swelling, drainage.  Lab Results: No results found for this basename: WBC:2,HGB:2,HCT:2,PLT:2 in the last 72 hours BMET No results found for this basename: NA:2,K:2,CL:2,CO2:2,GLUCOSE:2,BUN:2,CREATININE:2,CALCIUM:2 in the last 72 hours  Studies/Results: No results found.  Assessment/Plan: improving  LOS: 6 days  Expect d/c to home today after PT visit. Continue to mobilize.   Verdis Prime 10/01/2011, 8:28 AM

## 2011-10-01 NOTE — Progress Notes (Signed)
Occupational Therapy Treatment Patient Details Name: Jared Walker MRN: [id number] DOB: 09-08-1950 Today's Date: 10/01/2011 Time: [id number] OT Time Calculation (min): 11 min  OT Assessment / Plan / Recommendation Comments on Treatment Session Pt verbalizing that he thinks he could go home today.  Educated pt on need for assist at home since pt required mod assist to stand from chair, and help will not be available 24/7 at discharge.    Follow Up Recommendations  Skilled nursing facility;Supervision/Assistance - 24 hour    Equipment Recommendations  Defer to next venue;3 in 1 bedside comode    Frequency Min 2X/week   Plan Discharge plan needs to be updated    Precautions / Restrictions Precautions Precautions: Back;Fall   Pertinent Vitals/Pain Pt with 8/10 pain in back. RN made aware.    ADL  Toilet Transfer: Performed;Moderate assistance Toilet Transfer Method: Stand pivot Toilet Transfer Equipment: Other (comment) (bed at chair height) Equipment Used: Rolling walker Ambulation Related to ADLs: Min assist to [employer] during turns    OT Goals ADL Goals Pt Will Transfer to Toilet: with modified independence;with DME;Ambulation;3-in-1;Maintaining back safety precautions ADL Goal: Toilet Transfer - Progress: Progressing toward goals Miscellaneous OT Goals Miscellaneous OT Goal #1: Pt will perform bed mobility with mod I in prep for EOB ADLs. OT Goal: Miscellaneous Goal #1 - Progress: Progressing toward goals  Visit Information  Last OT Received On: 10/01/11 Assistance Needed: +1    Subjective Data      Prior Functioning       Cognition  Overall Cognitive Status: Appears within functional limits for tasks assessed/performed Arousal/Alertness: Awake/alert Orientation Level: Appears intact for tasks assessed Behavior During Session: [facility] for tasks performed Cognition - Other Comments: Pt had issues maintaining multiple commands during treatment.     Mobility Bed  Mobility Bed Mobility: Sit to Sidelying Right Sit to Supine: 3: Mod assist;HOB flat;With rail Details for Bed Mobility Assistance: cueing for technique Transfers Transfers: Sit to Stand;Stand to Sit Sit to Stand: 3: Mod assist;From chair/3-in-1;With upper extremity assist;With armrests Stand to Sit: To bed;With upper extremity assist;5: Supervision Details for Transfer Assistance: Cueing for safe hand placment.  Assist for balance and steadying    Exercises    Balance    End of Session OT - End of Session Equipment Utilized During Treatment: Gait belt Activity Tolerance: Patient limited by fatigue;Patient limited by pain Patient left: in bed;with call bell/phone within reach;with family/visitor present Nurse Communication: Mobility status  10/01/2011 Darrol Jump OTR/L Pager [phone] Office [phone]   Darrol Jump 10/01/2011, 2:36 PM

## 2011-10-01 NOTE — Clinical Social Work Psychosocial (Signed)
     Clinical Social Work Department BRIEF PSYCHOSOCIAL ASSESSMENT 10/01/2011  Patient:  BLAIDEN, LAC     Account Number:  [id number]     Admit date:  09/25/2011  Clinical Social Worker:  Lehman Prom  Date/Time:  10/01/2011 04:00 PM  Referred by:  Physician  Date Referred:  10/01/2011 Referred for  SNF Placement   Other Referral:   Interview type:  Patient Other interview type:    PSYCHOSOCIAL DATA Living Status:  WIFE Admitted from facility:   Level of care:   Primary support name:  Donn Pierini Primary support relationship to patient:  SPOUSE Degree of support available:   Supportive. Contact info:  [phone]    CURRENT CONCERNS Current Concerns  Post-Acute Placement   Other Concerns:    SOCIAL WORK ASSESSMENT / PLAN CSW met with to address consult and explained process of SNF. PT is recommending SNF as pt does not have 24 hour supervision and he is slow to progress. Pt's wife is unable to provide the needed supervision for pt to discharge home with home health services. CSW also spoke with pt's wife to explained SNF referral process. CSW will initiate SNF serach in [facility] and follow with bed offers. CSW will continue to follow to facilitate discharge to SNF.   Assessment/plan status:  Psychosocial Support/Ongoing Assessment of Needs Other assessment/ plan:   Information/referral to community resources:   as needed.    PATIENTS/FAMILYS RESPONSE TO PLAN OF CARE: Pt was pleasant and alert and oriented. Both pt and wife are agreeable to discharge plan to SNF.

## 2011-10-01 NOTE — Progress Notes (Signed)
Agree with PTA note on discharge update.  [place], [place] DPT [phone]

## 2011-10-01 NOTE — Progress Notes (Signed)
Physical Therapy Treatment Patient Details Name: Jared Walker MRN: [id number] DOB: 05-Nov-1950 Today's Date: 10/01/2011 Time: [id number] PT Time Calculation (min): 38 min  PT Assessment / Plan / Recommendation Comments on Treatment Session  Pt was able to improve gait velocity but still required VC's for upright posture and assistance with RW placement throughout treatment. Pt has problems with multiple task activites.     Follow Up Recommendations  Home health PT with 24 hour Supervision/Assistance   Equipment Recommendations  3 in 1 bedside comode    Frequency Min 5X/week   Plan Discharge plan remains appropriate;Discharge plan needs to be updated    Precautions / Restrictions Precautions Precautions: Back;Fall Precaution Comments: Pt was educated on twisting during turns with the RW, pt able to maintain 2/3 back precaustions during treatment  Restrictions Weight Bearing Restrictions: No   Pertinent Vitals/Pain     Mobility  Bed Mobility Bed Mobility: Not assessed Transfers Transfers: Sit to Stand;Stand to Sit Sit to Stand: From chair/3-in-1;With upper extremity assist;5: Supervision Stand to Sit: With upper extremity assist;To chair/3-in-1;5: Supervision Details for Transfer Assistance: VC's for hand placement during acsent/descent  Ambulation/Gait Ambulation/Gait Assistance: 4: Min assist Ambulation Distance (Feet): 100 Feet Assistive device: Rolling walker Ambulation/Gait Assistance Details: Pt still required constant VC's, redirection and assistance during treatement. Assistance with RW placment and VC for upright posture.  Gait Pattern: Step-to pattern;Trunk flexed;Decreased stride length Stairs: Yes Stairs Assistance: 4: Min guard ( Min guard A for safety ) Stair Management Technique: Two rails;Step to pattern Number of Stairs: 10  (5x2 in 3rd floor gym )    Exercises     PT Goals Acute Rehab PT Goals PT Goal: Sit to Stand - Progress: Progressing toward  goal PT Goal: Stand to Sit - Progress: Progressing toward goal PT Goal: Ambulate - Progress: Progressing toward goal PT Goal: Up/Down Stairs - Progress: Progressing toward goal  Visit Information  Last PT Received On: 10/01/11 Assistance Needed: +1    Subjective Data  Subjective: Pt has problems following multiple commands   Cognition  Overall Cognitive Status: Appears within functional limits for tasks assessed/performed Arousal/Alertness: Awake/alert Orientation Level: Appears intact for tasks assessed Behavior During Session: [facility] for tasks performed Cognition - Other Comments: Pt had issues maintaining multiple commands during treatment.     Balance     End of Session PT - End of Session Equipment Utilized During Treatment: Gait belt;Back brace Activity Tolerance: Patient tolerated treatment well Patient left: in chair;with call bell/phone within reach Nurse Communication: Mobility status    [name] 10/01/2011, 10:34 AM 10/01/2011 Jacqualyn Posey PTA [phone] pager [phone] office

## 2011-10-01 NOTE — Progress Notes (Addendum)
Pt was to be discharged home with homept/ot.  But does not have someone with him during day, and his wife is working,  Per PT assessment pt is not steady enough to be home alone. DR. Sterns office called, and Aaron Edelman, Dr. Donald Pore nurse called back and said he would let Dr.Stern know.

## 2011-10-01 NOTE — Progress Notes (Signed)
PT NOTE  I was called in by patient wife to talk with her regarding discharge. Patient had informed PT and OT that wife was planning on being home with him 24/7 to supervise and assist him as needed. Spoke with wife today and she confirmed that she works during the day and is unable to help or take time off of work. Patient and patient wife informed that patient is not safe to DC home alone during the day due to still requiring Min A for mobility and ADLs. Patient educated on his risk for falling or injuring himself if he were to go home alone without assist during the day. Patient and patient's wife agreed with the above and agreed to get CSW involed to get patient to [facility] for Rehab prior to discharging home. RN made aware and is planning to inform MD and CSW.   Thanks 10/01/2011 Jacqualyn Posey PTA [phone] pager [phone] office

## 2011-10-01 NOTE — Progress Notes (Signed)
Occupational Therapy Note  PT and OT spoke with pt and wife today regarding discharge.  PT/OT now informed that wife will be unable to provide 24/7 assist at home.  At this time, pt unsafe to DC home without 24/7 assist.  Pt and spouse agreeable to discharge to SNF in prep for safe return home. RN made aware and is planning to inform MD and CSW to begin searching for SNF placment. Thanks.  10/01/2011 Darrol Jump OTR/L Pager [phone] Office [phone]

## 2011-10-01 NOTE — Progress Notes (Signed)
Occupational Therapy Evaluation Patient Details Name: Jared Walker MRN: [id number] DOB: 24-Aug-1950 Today's Date: 10/01/2011 Time: [id number] OT Time Calculation (min): 13 min  OT Assessment / Plan / Recommendation Clinical Impression  Pt s/p laminectomy/microdiscecetomy L4-5.  Will benefit from acute OT to address below problem list in prep for d/c home with spouse.    OT Assessment  Patient needs continued OT Services    Follow Up Recommendations  Home health OT    Equipment Recommendations  3 in 1 bedside comode    Frequency Min 2X/week    Precautions / Restrictions Precautions Precautions: Back;Fall Precaution Comments: Pt able to recall 2/3 back precautions. Restrictions Weight Bearing Restrictions: No   Pertinent Vitals/Pain NA    ADL  Grooming: Wash/dry hands;Wash/dry face;Performed;Supervision/safety Where Assessed - Grooming: Standing at sink Upper Body Bathing: Simulated;Set up Where Assessed - Upper Body Bathing: Sitting, chair Lower Body Bathing: Simulated;Minimal assistance Where Assessed - Lower Body Bathing: Sit to stand from chair Upper Body Dressing: Simulated;Set up Where Assessed - Upper Body Dressing: Sitting, chair Lower Body Dressing: Simulated;Minimal assistance Where Assessed - Lower Body Dressing: Sit to stand from chair Toilet Transfer: Simulated;Minimal assistance Toilet Transfer Method: Ambulating Toilet Transfer Equipment: Other (comment) (chair) Equipment Used: Rolling walker ADL Comments: Pt educated on ADL techniques to maintain back precautions.    OT Goals Acute Rehab OT Goals OT Goal Formulation: With patient Time For Goal Achievement: 10/08/11 Potential to Achieve Goals: Good ADL Goals Pt Will Perform Grooming: with modified independence;Standing at sink ADL Goal: Grooming - Progress: Goal set today Pt Will Perform Lower Body Bathing: with modified independence;Sit to stand from chair;Sit to stand from bed;with adaptive  equipment ADL Goal: Lower Body Bathing - Progress: Goal set today Pt Will Perform Lower Body Dressing: with modified independence;Sit to stand from chair;Sit to stand from bed;with adaptive equipment ADL Goal: Lower Body Dressing - Progress: Goal set today Pt Will Transfer to Toilet: with modified independence;with DME;Ambulation;3-in-1;Maintaining back safety precautions ADL Goal: Toilet Transfer - Progress: Goal set today Pt Will Perform Tub/Shower Transfer: Shower transfer;Ambulation;with DME;Maintaining back safety precautions ADL Goal: Tub/Shower Transfer - Progress: Goal set today Miscellaneous OT Goals Miscellaneous OT Goal #1: Pt will perform bed mobility with mod I in prep for EOB ADLs. OT Goal: Miscellaneous Goal #1 - Progress: Goal set today  Visit Information  Last OT Received On: 10/01/11 Assistance Needed: +1    Subjective Data      Prior Functioning  Home Living Lives With: Spouse Available Help at Discharge: Family;Available 24 hours/day Type of Home: House Home Access: Stairs to enter [employer] of Steps: 3 Entrance Stairs-Rails: Left Home Layout: One level Bathroom Shower/Tub: Walk-in shower;Door [employer] Toilet: Programmer, systems: Yes How Accessible: Accessible via walker Home Adaptive Equipment: Straight cane;Grab bars in shower Prior Function Level of Independence: Independent with assistive device(s) Able to Take Stairs?: Yes Driving: Yes Vocation: On disability Comments: job Doctor, general practice: No difficulties Dominant Hand: Right    Cognition  Overall Cognitive Status: Appears within functional limits for tasks assessed/performed Arousal/Alertness: Awake/alert Orientation Level: Appears intact for tasks assessed Behavior During Session: [facility] for tasks performed    Extremity/Trunk Assessment Right Upper Extremity Assessment RUE ROM/Strength/Tone: Within functional levels RUE Sensation:  WFL - Light Touch RUE Coordination: WFL - gross/fine motor Left Upper Extremity Assessment LUE ROM/Strength/Tone: Within functional levels LUE Sensation: WFL - Light Touch LUE Coordination: WFL - gross/fine motor   Mobility Transfers Sit to Stand: 4: Min guard;From  chair/3-in-1;With armrests;With upper extremity assist Stand to Sit: 5: Supervision;To chair/3-in-1;With armrests;With upper extremity assist Details for Transfer Assistance: VC's for safe hand placement   Exercise    Balance    End of Session OT - End of Session Equipment Utilized During Treatment: Gait belt Activity Tolerance: Patient tolerated treatment well;Patient limited by fatigue Patient left: in chair;with call bell/phone within reach Nurse Communication: Mobility status  10/01/2011 Darrol Jump OTR/L Pager [phone] Office [phone]   Darrol Jump 10/01/2011, 10:00 AM

## 2011-10-02 NOTE — Discharge Instructions (Signed)
Home Health to be provided by [facility] [phone]

## 2011-10-02 NOTE — Clinical Social Work Placement (Addendum)
Clinical Social Work Department CLINICAL SOCIAL WORK PLACEMENT NOTE 10/02/2011  Patient:  Jared Walker, Jared Walker  Account Number:  [id number] Admit date:  09/25/2011  Clinical Social Worker:  Barbette Or, Latanya Presser  Date/time:  10/02/2011 08:57 PM  Clinical Social Work is seeking post-discharge placement for this patient at the following level of care:   SKILLED NURSING   (*CSW will update this form in Epic as items are completed)   10/01/2011  Patient/family provided with [place] Department of Clinical Social Work's list of facilities offering this level of care within the geographic area requested by the patient (or if unable, by the patient's family).  10/01/2011  Patient/family informed of their freedom to choose among providers that offer the needed level of care, that participate in Medicare, Medicaid or managed care program needed by the patient, have an available bed and are willing to accept the patient.  10/01/2011  Patient/family informed of [facility]' ownership interest in [facility], as well as of the fact that they are under no obligation to receive care at this facility.  PASARR submitted to EDS on 10/02/2011 PASARR number received from EDS on   FL2 transmitted to all facilities in geographic area requested by pt/family on  10/01/2011 FL2 transmitted to all facilities within larger geographic area on   Patient informed that his/her managed care company has contracts with or will negotiate with  certain facilities, including the following:     Patient/family informed of bed offers received:  10/02/2011 Patient chooses bed at  Physician recommends and patient chooses bed at    Patient to be transferred to Home on  10/02/2011 Patient to be transferred to facility by   The following physician request were entered in Epic:   Additional Comments: Patient requesting [facility] - Patient returning home with granddaughter and wife who are able to provide 24  hour supervision.

## 2011-10-02 NOTE — Progress Notes (Signed)
CARE MANAGEMENT NOTE 10/02/2011   Patient:  Jared Walker, Jared Walker   Account Number:  [id number]  Date Initiated:  10/02/2011  Documentation initiated by:  Ricki Miller  Subjective/Objective Assessment:   61 yr old male s/p left L4-5 microdiscectomy     Action/Plan:   Spoke with patient regarding home health needs, choice offered. Patient will have family support at discharge.   Anticipated DC Date:  10/02/2011   Anticipated DC Plan:  [facility]  CM consult      PAC Choice  [place]   Choice offered to / List presented to:  C-1 Patient   DME arranged  Vassie Moselle      DME agency  [place] arranged  [place]   Status of service:  Completed, signed off  Discharge Disposition:  [place]

## 2011-10-02 NOTE — Progress Notes (Addendum)
Occupational Therapy Treatment Patient Details Name: TRAI NASH MRN: [id number] DOB: Nov 24, 1950 Today's Date: 10/02/2011 Time: [id number] OT Time Calculation (min): 25 min  OT Assessment / Plan / Recommendation Comments on Treatment Session pt. initiated and showed safety with sequencing and moving throughout room during tx. session    Follow Up Recommendations  Skilled nursing facility;Supervision/Assistance - 24 hour    Equipment Recommendations  Defer to next venue    Frequency Min 2X/week   Plan Discharge plan remains appropriate    Precautions / Restrictions Precautions Precautions: Back;Fall Precaution Comments: able to state 2/3 back precautions-cues to recall arching with demonstration Restrictions Weight Bearing Restrictions: No   Pertinent Vitals/Pain 2/10 pain, meds given prior to arrival    ADL  Grooming: Performed;Wash/dry face;Minimal assistance Where Assessed - Grooming: Standing at sink Lower Body Dressing: Performed;Minimal assistance (able to cross legs over to donn/doff shorts) Where Assessed - Lower Body Dressing: Sitting, chair Toilet Transfer: Performed;Supervision/safety Toilet Transfer Method: Counselling psychologist: Regular height toilet Toileting - Clothing Manipulation: Performed;Minimal assistance Where Assessed - Toileting Clothing Manipulation: Sit to stand from 3-in-1 or toilet Toileting - Hygiene: Simulated;Minimal assistance Where Assessed - Toileting Hygiene: Sit to stand from 3-in-1 or toilet    OT Goals ADL Goals ADL Goal: Grooming - Progress: Progressing toward goals ADL Goal: Lower Body Bathing - Progress: Progressing toward goals ADL Goal: Lower Body Dressing - Progress: Progressing toward goals ADL Goal: Toilet Transfer - Progress: Progressing toward goals Miscellaneous OT Goals OT Goal: Miscellaneous Goal #1 - Progress: Progressing toward goals  Visit Information       Subjective Data  Subjective: "I can  cross my legs"- in response to lower body dressing plans Patient Stated Goal: to return home   Prior Functioning       Cognition  Overall Cognitive Status: Appears within functional limits for tasks assessed/performed Arousal/Alertness: Awake/alert Orientation Level: Appears intact for tasks assessed Behavior During Session: [facility] for tasks performed    Mobility Bed Mobility Bed Mobility: Rolling Left;Left Sidelying to Sit;Sitting - Scoot to Edge of Bed Rolling Left: 4: Min assist Left Sidelying to Sit: HOB flat;4: Min assist Sitting - Scoot to Edge of Bed: 5: Supervision Details for Bed Mobility Assistance: cueing for maintaing back precautions throughout transfer Transfers Transfers: Sit to Stand;Stand to Sit Sit to Stand: 5: Supervision;With upper extremity assist;With armrests;From bed;From toilet Stand to Sit: 4: Min guard;With upper extremity assist;With armrests;To toilet;To chair/3-in-1 Details for Transfer Assistance: pt. moves slow, but did not require assistance for safety   Exercises    Balance    End of Session OT - End of Session Activity Tolerance: Patient tolerated treatment well Patient left: in chair;with call bell/phone within reach   Janice Coffin 10/02/2011, 10:30 AM

## 2011-10-02 NOTE — Progress Notes (Signed)
Subjective: Patient reports "I'm doing good"  Objective: Vital signs in last 24 hours: Temp:  [98.7 F (37.1 C)-99 F (37.2 C)] 98.8 F (37.1 C) (05/03 0600) Pulse Rate:  [83-107] 107  (05/03 0600) Resp:  [18-20] 18  (05/03 0600) BP: (148-184)/(80-97) 152/92 mmHg (05/03 0600) SpO2:  [94 %-97 %] 95 % (05/03 0600)  Intake/Output from previous day: 05/02 0701 - 05/03 0700 In: 243 [P.O.:240; I.V.:3] Out: -  Intake/Output this shift: Total I/O In: 360 [P.O.:360] Out: -   Ambulating with PT, smiling & conversant. Pt reports wife is considering staying home with him. Discussion with PT : improving strength and endurance, but still requiring max assist with transfers, so SNF recommended for safety.   Lab Results: No results found for this basename: WBC:2,HGB:2,HCT:2,PLT:2 in the last 72 hours BMET No results found for this basename: NA:2,K:2,CL:2,CO2:2,GLUCOSE:2,BUN:2,CREATININE:2,CALCIUM:2 in the last 72 hours  Studies/Results: No results found.  Assessment/Plan: Improving  LOS: 7 days  Continue to mobilize with PT. Plan for SNF.   Verdis Prime 10/02/2011, 9:00 AM

## 2011-10-02 NOTE — Clinical Social Work Note (Signed)
Clinical Social Worker spoke with patient at bedside to provide offers and confirm patient plans at discharge.  Patient was appreciative of bed offers, however states that he has made arrangements with his wife and granddaughter to provide 24 hour supervision at home at discharge.  CSW notified CM and MD to make arrangements for home health needs prior to discharge.    Clinical Social Worker will sign off for now as social work intervention is no longer needed. Please consult [place] again if new need arises.  [place], [place]

## 2011-10-02 NOTE — Progress Notes (Signed)
Physical Therapy Treatment Patient Details Name: Jared Walker MRN: [id number] DOB: [birth year] Today's Date: 10/02/2011 Time: [id number] PT Time Calculation (min): 18 min  PT Assessment / Plan / Recommendation Comments on Treatment Session  Pt with improved gait pattern, sequencing and velocity. Pt did not need any assistance or VC's to correct posture during amb. Min guard for  Safety. Pt was able to increase velocity on command without LOB or unsteadiness but still at high risk of falls compared to normal function.         Follow Up Recommendations  Skilled nursing facility    Equipment Recommendations  Defer to next venue    Frequency Min 5X/week   Plan Discharge plan remains appropriate;Frequency remains appropriate    Precautions / Restrictions Precautions Precautions: Back;Fall Precaution Comments: able to state 2/3 back precautions-cues to recall arching with demonstration   Pertinent Vitals/Pain     Mobility  Bed Mobility Bed Mobility: Rolling Left;Left Sidelying to Sit;Sitting - Scoot to Edge of Bed Rolling Left: 4: Min assist Left Sidelying to Sit: HOB flat;4: Min assist Sitting - Scoot to Edge of Bed: 5: Supervision Details for Bed Mobility Assistance: cueing for maintaing back precautions throughout transfer Transfers Transfers: Sit to Stand;Stand to Sit Sit to Stand: 5: Supervision;With upper extremity assist;With armrests;From bed;From toilet Stand to Sit: 4: Min guard;With upper extremity assist;With armrests;To toilet;To chair/3-in-1 Details for Transfer Assistance: pt. moves slow, but did not require assistance for safety Ambulation/Gait Ambulation/Gait Assistance: 4: Min guard Ambulation Distance (Feet): 100 Feet Assistive device: Rolling walker Ambulation/Gait Assistance Details: Pt amb without assistance with RW but VC for upright posture and not twisting back during turns. Pt was educated on maintaining back precaustions while turning with RW.  Gait  Pattern: Step-through pattern;Decreased stride length;Trunk flexed Stairs: No    Exercises     PT Goals Acute Rehab PT Goals PT Goal: Sit to Stand - Progress: Met PT Goal: Stand to Sit - Progress: Met PT Goal: Ambulate - Progress: Progressing toward goal  Visit Information  Last PT Received On: 10/02/11    Subjective Data  Subjective: Pt was more attentive during treatment    Cognition  Overall Cognitive Status: Appears within functional limits for tasks assessed/performed Arousal/Alertness: Awake/alert Orientation Level: Appears intact for tasks assessed Behavior During Session: [facility] for tasks performed    Balance     End of Session PT - End of Session Activity Tolerance: Patient tolerated treatment well Patient left: in chair;with call bell/phone within reach Nurse Communication: Mobility status    [name] 10/02/2011, 12:09 PM

## 2011-10-02 NOTE — Progress Notes (Signed)
10/02/2011 Jacqualyn Posey PTA [phone] pager [phone] office

## 2011-10-20 ENCOUNTER — Observation Stay: Payer: Self-pay | Admitting: Internal Medicine

## 2011-10-20 LAB — URINALYSIS, COMPLETE
Leukocyte Esterase: NEGATIVE
Nitrite: NEGATIVE
Ph: 5 (ref 4.5–8.0)
Specific Gravity: 1.024 (ref 1.003–1.030)
Squamous Epithelial: NONE SEEN
WBC UR: 1 /HPF (ref 0–5)

## 2011-10-20 LAB — COMPREHENSIVE METABOLIC PANEL
Alkaline Phosphatase: 78 U/L (ref 50–136)
Anion Gap: 7 (ref 7–16)
BUN: 11 mg/dL (ref 7–18)
Bilirubin,Total: 0.3 mg/dL (ref 0.2–1.0)
Co2: 28 mmol/L (ref 21–32)
Glucose: 91 mg/dL (ref 65–99)

## 2011-10-20 LAB — CBC
HCT: 36.9 % — ABNORMAL LOW (ref 40.0–52.0)
RBC: 3.93 10*6/uL — ABNORMAL LOW (ref 4.40–5.90)
WBC: 9.8 10*3/uL (ref 3.8–10.6)

## 2011-10-20 LAB — TROPONIN I: Troponin-I: <0.02 ng/mL

## 2011-10-22 LAB — BASIC METABOLIC PANEL
BUN: 10 mg/dL (ref 7–18)
Calcium, Total: 8.8 mg/dL (ref 8.5–10.1)
Chloride: 104 mmol/L (ref 98–107)
Co2: 25 mmol/L (ref 21–32)
Creatinine: 1.17 mg/dL (ref 0.60–1.30)
EGFR (African American): >60
EGFR (Non-African Amer.): >60
Osmolality: 275 (ref 275–301)
Sodium: 138 mmol/L (ref 136–145)

## 2011-10-22 LAB — CBC WITH DIFFERENTIAL/PLATELET
Basophil %: 0.6 %
Eosinophil #: 0.2 10*3/uL (ref 0.0–0.7)
HGB: 12 g/dL — ABNORMAL LOW (ref 13.0–18.0)
MCH: 31.1 pg (ref 26.0–34.0)
MCHC: 33.1 g/dL (ref 32.0–36.0)
MCV: 94 fL (ref 80–100)
Monocyte %: 7.9 %
Neutrophil #: 6.4 10*3/uL (ref 1.4–6.5)
RBC: 3.87 10*6/uL — ABNORMAL LOW (ref 4.40–5.90)
RDW: 13.6 % (ref 11.5–14.5)

## 2011-11-12 ENCOUNTER — Inpatient Hospital Stay: Payer: Self-pay | Admitting: Internal Medicine

## 2011-11-12 LAB — CBC
HCT: 40.6 % (ref 40.0–52.0)
MCH: 30.5 pg (ref 26.0–34.0)
MCHC: 32.4 g/dL (ref 32.0–36.0)
MCV: 94 fL (ref 80–100)
Platelet: 173 10*3/uL (ref 150–440)
RBC: 4.31 10*6/uL — ABNORMAL LOW (ref 4.40–5.90)
RDW: 14 % (ref 11.5–14.5)
WBC: 8.5 10*3/uL (ref 3.8–10.6)

## 2011-11-12 LAB — COMPREHENSIVE METABOLIC PANEL
Albumin: 3.7 g/dL (ref 3.4–5.0)
Alkaline Phosphatase: 104 U/L (ref 50–136)
Anion Gap: 8 (ref 7–16)
BUN: 10 mg/dL (ref 7–18)
Bilirubin,Total: 0.3 mg/dL (ref 0.2–1.0)
Calcium, Total: 9.1 mg/dL (ref 8.5–10.1)
Chloride: 107 mmol/L (ref 98–107)
Osmolality: 284 (ref 275–301)
SGOT(AST): 43 U/L — ABNORMAL HIGH (ref 15–37)
Sodium: 143 mmol/L (ref 136–145)
Total Protein: 8 g/dL (ref 6.4–8.2)

## 2011-11-12 LAB — TROPONIN I: Troponin-I: <0.02 ng/mL

## 2011-11-12 LAB — APTT: Activated PTT: 29.9 secs (ref 23.6–35.9)

## 2011-11-12 LAB — CK TOTAL AND CKMB (NOT AT ARMC)
CK, Total: 88 U/L (ref 35–232)
CK-MB: 1.3 ng/mL (ref 0.5–3.6)

## 2011-11-13 DIAGNOSIS — I517 Cardiomegaly: Secondary | ICD-10-CM

## 2011-11-13 LAB — CBC WITH DIFFERENTIAL/PLATELET
Basophil #: 0 10*3/uL (ref 0.0–0.1)
HCT: 42.4 % (ref 40.0–52.0)
Lymphocyte #: 1.1 10*3/uL (ref 1.0–3.6)
MCH: 30.4 pg (ref 26.0–34.0)
Neutrophil #: 5.8 10*3/uL (ref 1.4–6.5)
Platelet: 163 10*3/uL (ref 150–440)
WBC: 7.7 10*3/uL (ref 3.8–10.6)

## 2011-11-13 LAB — BASIC METABOLIC PANEL
BUN: 10 mg/dL (ref 7–18)
Chloride: 107 mmol/L (ref 98–107)
Creatinine: 0.98 mg/dL (ref 0.60–1.30)
EGFR (Non-African Amer.): >60
Glucose: 104 mg/dL — ABNORMAL HIGH (ref 65–99)
Osmolality: 281 (ref 275–301)
Potassium: 3.6 mmol/L (ref 3.5–5.1)
Sodium: 141 mmol/L (ref 136–145)

## 2011-11-13 LAB — LIPID PANEL
HDL Cholesterol: 36 mg/dL — ABNORMAL LOW (ref 40–60)
Triglycerides: 124 mg/dL (ref 0–200)
VLDL Cholesterol, Calc: 25 mg/dL (ref 5–40)

## 2011-12-04 ENCOUNTER — Emergency Department: Payer: Self-pay | Admitting: Emergency Medicine

## 2011-12-04 LAB — COMPREHENSIVE METABOLIC PANEL
Albumin: 3.9 g/dL (ref 3.4–5.0)
Anion Gap: 5 — ABNORMAL LOW (ref 7–16)
Calcium, Total: 8.9 mg/dL (ref 8.5–10.1)
Co2: 25 mmol/L (ref 21–32)
EGFR (Non-African Amer.): >60
Glucose: 102 mg/dL — ABNORMAL HIGH (ref 65–99)
Osmolality: 277 (ref 275–301)
Potassium: 3.5 mmol/L (ref 3.5–5.1)
SGOT(AST): 36 U/L (ref 15–37)
Sodium: 139 mmol/L (ref 136–145)

## 2011-12-04 LAB — CBC
HGB: 13.4 g/dL (ref 13.0–18.0)
MCH: 30.5 pg (ref 26.0–34.0)
MCHC: 32.4 g/dL (ref 32.0–36.0)
RDW: 14 % (ref 11.5–14.5)

## 2012-01-20 ENCOUNTER — Other Ambulatory Visit: Payer: Self-pay | Admitting: Neurosurgery

## 2012-01-20 DIAGNOSIS — M5126 Other intervertebral disc displacement, lumbar region: Secondary | ICD-10-CM

## 2012-01-29 ENCOUNTER — Ambulatory Visit
Admission: RE | Admit: 2012-01-29 | Discharge: 2012-01-29 | Disposition: A | Discharge status: Home / Self Care | Payer: BC Managed Care – PPO | Source: Ambulatory Visit | Attending: Neurosurgery | Admitting: Neurosurgery

## 2012-01-29 DIAGNOSIS — M5126 Other intervertebral disc displacement, lumbar region: Secondary | ICD-10-CM

## 2012-01-29 MED ORDER — GADOBENATE DIMEGLUMINE 529 MG/ML IV SOLN
20.0000 mL | Freq: Once | INTRAVENOUS | Status: AC | PRN
  Administered 2012-01-29: 20 mL via INTRAVENOUS

## 2012-07-13 ENCOUNTER — Other Ambulatory Visit: Payer: Self-pay | Admitting: Neurosurgery

## 2012-07-13 DIAGNOSIS — M542 Cervicalgia: Secondary | ICD-10-CM

## 2012-07-13 DIAGNOSIS — M5126 Other intervertebral disc displacement, lumbar region: Secondary | ICD-10-CM

## 2012-07-23 ENCOUNTER — Other Ambulatory Visit: Payer: BC Managed Care – PPO

## 2012-07-25 ENCOUNTER — Ambulatory Visit
Admission: RE | Admit: 2012-07-25 | Discharge: 2012-07-25 | Disposition: A | Discharge status: Home / Self Care | Payer: Medicaid Other | Source: Ambulatory Visit | Attending: Neurosurgery | Admitting: Neurosurgery

## 2012-07-25 DIAGNOSIS — M542 Cervicalgia: Secondary | ICD-10-CM

## 2012-07-25 DIAGNOSIS — M5126 Other intervertebral disc displacement, lumbar region: Secondary | ICD-10-CM

## 2012-07-25 MED ORDER — GADOBENATE DIMEGLUMINE 529 MG/ML IV SOLN
18.0000 mL | Freq: Once | INTRAVENOUS | Status: AC | PRN
  Administered 2012-07-25: 18 mL via INTRAVENOUS

## 2012-12-13 ENCOUNTER — Encounter: Payer: Self-pay | Admitting: Internal Medicine

## 2012-12-30 ENCOUNTER — Encounter: Payer: Self-pay | Admitting: Internal Medicine

## 2014-08-27 ENCOUNTER — Emergency Department: Payer: Self-pay | Admitting: Emergency Medicine

## 2014-09-12 ENCOUNTER — Ambulatory Visit
Admit: 2014-09-12 | Disposition: A | Discharge status: Home / Self Care | Payer: Self-pay | Attending: Unknown Physician Specialty | Admitting: Unknown Physician Specialty

## 2014-09-23 NOTE — H&P (Signed)
PATIENT NAME:  Jared Walker, Jared Walker MR#:  [id number] DATE OF BIRTH:  07-15-1950  DATE OF ADMISSION:  10/20/2011  ADDENDUM:  The patient also takes Trilipix  for his HAART.  PLAN: For his HIV we will allow the patient to take his own medication.   ____________________________ Donell Beers. Benjie Karvonen, MD spm:bjt D: 10/20/2011 18:14:07 ET T: 10/21/2011 07:12:46 ET JOB#: [id number]  cc: Theda Payer P. Benjie Karvonen, MD, <Dictator> Donell Beers Braedyn Kauk MD ELECTRONICALLY SIGNED 10/21/2011 20:48
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
## 2014-09-23 NOTE — Discharge Summary (Signed)
PATIENT NAME:  Jared Walker, Jared Walker MR#:  [id number] DATE OF BIRTH:  06/16/1950  DATE OF ADMISSION:  11/12/2011 DATE OF DISCHARGE:  11/14/2011  HISTORY OF PRESENT ILLNESS: Jared Walker is a 64 year old black gentleman with a history of hard to control hypertension who presented to the Emergency Room complaining of numbness in his left hand. Apparently his wife thought she saw some facial droop on the right side. He had also had some slurred speech. He had had no headache. He had had blurry vision. His primary complaint at the time of presentation was numbness and tingling in the left hand. In the ER, he was noted to be markedly hypertensive and, therefore, was admitted for further therapy and evaluation for possible cerebrovascular accident.   PAST MEDICAL HISTORY:  1. Hypertension.  2. History of gout.  3. HIV with an undetectable viral load.  4. Hepatitis C.  5. Benign prostatic hypertrophy.  6. Chronic anxiety.  7. Chronic back pain.   PAST SURGICAL HISTORY: The patient's surgical history included a previous lumbar laminectomy in April of this year.   ALLERGIES: The patient's allergies were nonexistent.   MEDICATIONS ON ADMISSION:  1. Diazepam 5 mg daily p.r.n.  2. cyclobenzaprine 10 mg every 8 hours p.r.n.  3. Atenolol 100 mg daily.  4. Allopurinol 100 mg daily.  5. Doxazosin 4 mg b.i.d.  6. Hydralazine 100 mg t.i.d.  7. Lisinopril 40 mg daily.  8. Atenolol 100 mg daily.   ADMISSION PHYSICAL EXAMINATION: VITAL SIGNS: Temperature 98.6, pulse 78, respirations 18, blood pressure 178/102. Exam as described by the admitting physician was relatively unremarkable with the exception of the neurological. The patient was noted to be slow to respond and had mild dysarthria. No focal neurological findings were noted, however.   LABORATORY, DIAGNOSTIC AND RADIOLOGIC DATA:  The patient 's admission CBC showed a hemoglobin of 13.2 with a hematocrit of 40.6. Platelet count was 173,000. White count  was 8500.  Admission comprehensive metabolic panel showed an SGOT of 43 but was otherwise unremarkable.  Serial cardiac enzymes were negative.  Lipid panel showed triglycerides to be 124. HDL cholesterol was 36. LDL cholesterol was 71. Electrocardiogram showed a normal sinus rhythm with left atrial enlargement. Nonspecific ST-T wave changes were noted. A subsequent echocardiogram showed no source of a cerebrovascular accident. Left ventricular function was normal at greater than 55%. There was moderate concentric left ventricular hypertrophy. There was also a suggestion of impaired left ventricular relaxation consistent with diastolic dysfunction.  Chest x-ray was unremarkable with the exception of an artifact at the periphery of the right hemithorax.  Head CT in the Emergency Room without contrast showed no acute process.  Bilateral carotid Dopplers showed no hemodynamically significant stenosis.   HOSPITAL COURSE: The patient was admitted to the regular medical floor where he was placed on telemetry. He was continued on his regular blood pressure medication without change. He did receive some IV hydralazine in the Emergency Room. He subsequently was sent for an MRI which showed no acute infarct. There was mild chronic microangiopathy.   The patient's hospital course was relatively unremarkable. Within 24 hours, he was back to baseline mentally and neurologically. His blood pressure was fairly well controlled on his routine medications without change. Compliance with medication and diet was strongly suggested to the patient.   DISCHARGE DIAGNOSES:  1. Hypertensive crisis.  2. Essential hypertension.   DISCHARGE INSTRUCTIONS AND FOLLOWUP:   1. The patient will be discharged on a low sodium diet.  2.  Activity as tolerated.  3. He is to call Dr. Ginette Pitman the first of the week to arrange for a followup, if necessary.    ____________________________ Hewitt Blade Sarina Ser, MD jbw:cbb D: 11/14/2011  09:20:18 ET T: 11/16/2011 09:47:24 ET JOB#: [id number]  cc: Hewitt Blade. Sarina Ser, MD, <Dictator> Lottie Mussel III MD ELECTRONICALLY SIGNED 11/22/2011 17:32
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
## 2014-10-10 ENCOUNTER — Encounter: Payer: Self-pay | Admitting: *Deleted

## 2014-10-10 NOTE — Anesthesia Preprocedure Evaluation (Addendum)
Anesthesia Evaluation    Airway Mallampati: II  TM Distance: >3 FB Neck ROM: Full    Dental no notable dental hx. (+) Edentulous Upper, Edentulous Lower   Pulmonary former smoker,  breath sounds clear to auscultation  Pulmonary exam normal       Cardiovascular hypertension, Normal cardiovascular examRhythm:Regular Rate:Normal + Systolic murmurs Echo 0000000 normal LV fxn- mild aortic sclerosis- no hemodynamically significant AS.  Mets 4+   Neuro/Psych    GI/Hepatic (+) Hepatitis -, C  Endo/Other    Renal/GU Renal disease     Musculoskeletal   Abdominal   Peds  Hematology  (+) HIV,   Anesthesia Other Findings   Reproductive/Obstetrics                            Anesthesia Physical Anesthesia Plan  ASA: II  Anesthesia Plan: General and Regional   Post-op Pain Management:    Induction: Intravenous  Airway Management Planned:   Additional Equipment:   Intra-op Plan:   Post-operative Plan: Extubation in OR  Informed Consent: I have reviewed the patients History and Physical, chart, labs and discussed the procedure including the risks, benefits and alternatives for the proposed anesthesia with the patient or authorized representative who has indicated his/her understanding and acceptance.   Dental advisory given  Plan Discussed with: CRNA  Anesthesia Plan Comments:         Anesthesia Quick Evaluation

## 2014-10-12 ENCOUNTER — Ambulatory Visit
Admission: RE | Admit: 2014-10-12 | Discharge: 2014-10-12 | Disposition: A | Discharge status: Home / Self Care | Payer: Medicare Other | Source: Ambulatory Visit | Attending: Unknown Physician Specialty | Admitting: Unknown Physician Specialty

## 2014-10-12 ENCOUNTER — Ambulatory Visit: Payer: Medicare Other | Admitting: Anesthesiology

## 2014-10-12 ENCOUNTER — Encounter
Admission: RE | Disposition: A | Discharge status: Home / Self Care | Payer: Self-pay | Source: Ambulatory Visit | Attending: Unknown Physician Specialty

## 2014-10-12 DIAGNOSIS — Z21 Asymptomatic human immunodeficiency virus [HIV] infection status: Secondary | ICD-10-CM | POA: Insufficient documentation

## 2014-10-12 DIAGNOSIS — Z8489 Family history of other specified conditions: Secondary | ICD-10-CM | POA: Insufficient documentation

## 2014-10-12 DIAGNOSIS — Z87442 Personal history of urinary calculi: Secondary | ICD-10-CM | POA: Insufficient documentation

## 2014-10-12 DIAGNOSIS — M7541 Impingement syndrome of right shoulder: Secondary | ICD-10-CM | POA: Insufficient documentation

## 2014-10-12 DIAGNOSIS — Z9049 Acquired absence of other specified parts of digestive tract: Secondary | ICD-10-CM | POA: Insufficient documentation

## 2014-10-12 DIAGNOSIS — M109 Gout, unspecified: Secondary | ICD-10-CM | POA: Insufficient documentation

## 2014-10-12 DIAGNOSIS — Z8619 Personal history of other infectious and parasitic diseases: Secondary | ICD-10-CM | POA: Insufficient documentation

## 2014-10-12 DIAGNOSIS — Z8249 Family history of ischemic heart disease and other diseases of the circulatory system: Secondary | ICD-10-CM | POA: Insufficient documentation

## 2014-10-12 DIAGNOSIS — Z79899 Other long term (current) drug therapy: Secondary | ICD-10-CM | POA: Diagnosis not present

## 2014-10-12 DIAGNOSIS — Z809 Family history of malignant neoplasm, unspecified: Secondary | ICD-10-CM | POA: Insufficient documentation

## 2014-10-12 DIAGNOSIS — Z87891 Personal history of nicotine dependence: Secondary | ICD-10-CM | POA: Diagnosis not present

## 2014-10-12 DIAGNOSIS — Z833 Family history of diabetes mellitus: Secondary | ICD-10-CM | POA: Insufficient documentation

## 2014-10-12 DIAGNOSIS — M7521 Bicipital tendinitis, right shoulder: Secondary | ICD-10-CM | POA: Diagnosis not present

## 2014-10-12 DIAGNOSIS — I10 Essential (primary) hypertension: Secondary | ICD-10-CM | POA: Insufficient documentation

## 2014-10-12 DIAGNOSIS — Z885 Allergy status to narcotic agent status: Secondary | ICD-10-CM | POA: Insufficient documentation

## 2014-10-12 DIAGNOSIS — Z823 Family history of stroke: Secondary | ICD-10-CM | POA: Diagnosis not present

## 2014-10-12 DIAGNOSIS — Z888 Allergy status to other drugs, medicaments and biological substances status: Secondary | ICD-10-CM | POA: Diagnosis not present

## 2014-10-12 DIAGNOSIS — M7520 Bicipital tendinitis, unspecified shoulder: Secondary | ICD-10-CM | POA: Diagnosis present

## 2014-10-12 HISTORY — DX: Presence of dental prosthetic device (complete) (partial): Z97.2

## 2014-10-12 HISTORY — PX: SHOULDER ARTHROSCOPY WITH OPEN ROTATOR CUFF REPAIR: SHX6092

## 2014-10-12 HISTORY — DX: Complete loss of teeth, unspecified cause, unspecified class: K08.109

## 2014-10-12 SURGERY — ARTHROSCOPY, SHOULDER WITH REPAIR, ROTATOR CUFF, OPEN
Anesthesia: Regional | Laterality: Right | Wound class: Clean

## 2014-10-12 MED ORDER — HYDROCODONE-ACETAMINOPHEN 5-325 MG PO TABS: 1.0000 | ORAL_TABLET | Freq: Four times a day (QID) | ORAL | Status: DC | PRN

## 2014-10-12 MED ORDER — CEFAZOLIN SODIUM-DEXTROSE 2-3 GM-% IV SOLR
2.0000 g | INTRAVENOUS | Status: AC
  Administered 2014-10-12: 2 g via INTRAVENOUS

## 2014-10-12 MED ORDER — FENTANYL CITRATE (PF) 100 MCG/2ML IJ SOLN
INTRAMUSCULAR | Status: DC | PRN
  Administered 2014-10-12: 100 ug via INTRAVENOUS

## 2014-10-12 MED ORDER — EPINEPHRINE HCL 1 MG/ML IJ SOLN
INTRAMUSCULAR | Status: DC | PRN
  Administered 2014-10-12: 1 mg

## 2014-10-12 MED ORDER — GLYCOPYRROLATE 0.2 MG/ML IJ SOLN
INTRAMUSCULAR | Status: DC | PRN
  Administered 2014-10-12: 0.2 mg via INTRAVENOUS

## 2014-10-12 MED ORDER — PROPOFOL 10 MG/ML IV BOLUS
INTRAVENOUS | Status: DC | PRN
  Administered 2014-10-12: 200 mg via INTRAVENOUS

## 2014-10-12 MED ORDER — KETOROLAC TROMETHAMINE 30 MG/ML IJ SOLN: 30.0000 mg | Freq: Once | INTRAMUSCULAR | Status: DC | PRN

## 2014-10-12 MED ORDER — LACTATED RINGERS IV SOLN
INTRAVENOUS | Status: DC
  Administered 2014-10-12 (×2): via INTRAVENOUS

## 2014-10-12 MED ORDER — LIDOCAINE HCL (CARDIAC) 20 MG/ML IV SOLN
INTRAVENOUS | Status: DC | PRN
  Administered 2014-10-12: 50 mg via INTRATRACHEAL

## 2014-10-12 MED ORDER — PROMETHAZINE HCL 25 MG/ML IJ SOLN
6.2500 mg | INTRAMUSCULAR | Status: DC | PRN
Start: 2014-10-12 — End: 2014-10-12

## 2014-10-12 MED ORDER — DEXAMETHASONE SODIUM PHOSPHATE 4 MG/ML IJ SOLN
INTRAMUSCULAR | Status: DC | PRN
  Administered 2014-10-12: 4 mg via INTRAVENOUS

## 2014-10-12 MED ORDER — LACTATED RINGERS IR SOLN
Status: DC | PRN
  Administered 2014-10-12: 3000 mL

## 2014-10-12 MED ORDER — ROPIVACAINE HCL 5 MG/ML IJ SOLN
INTRAMUSCULAR | Status: DC | PRN
  Administered 2014-10-12: 30 mL via EPIDURAL

## 2014-10-12 MED ORDER — HYDROMORPHONE HCL 1 MG/ML IJ SOLN: 0.2500 mg | INTRAMUSCULAR | Status: DC | PRN

## 2014-10-12 MED ORDER — OXYCODONE HCL 5 MG/5ML PO SOLN: 5.0000 mg | Freq: Once | ORAL | Status: DC | PRN

## 2014-10-12 MED ORDER — MIDAZOLAM HCL 5 MG/5ML IJ SOLN
INTRAMUSCULAR | Status: DC | PRN
  Administered 2014-10-12: 2 mg via INTRAVENOUS

## 2014-10-12 MED ORDER — OXYCODONE HCL 5 MG PO TABS: 5.0000 mg | ORAL_TABLET | Freq: Once | ORAL | Status: DC | PRN

## 2014-10-12 MED ORDER — ONDANSETRON HCL 4 MG/2ML IJ SOLN
INTRAMUSCULAR | Status: DC | PRN
  Administered 2014-10-12: 4 mg via INTRAVENOUS

## 2014-10-12 SURGICAL SUPPLY — 73 items
ADAPTER IRRIG TUBE 2 SPIKE SOL (ADAPTER) ×2 IMPLANT
ARTHROWAND PARAGON T2 (SURGICAL WAND)
BLADE SURG 15 STRL LF DISP TIS (BLADE) IMPLANT
BLADE SURG 15 STRL SS (BLADE)
BUR BR 5.5 12 FLUTE (BURR) IMPLANT
BUR HOODED 3.0 ABRASION (BURR) IMPLANT
BUR RADIUS 4.0X18.5 (BURR) ×2 IMPLANT
BUR RADIUS 5.5 (BURR) ×2 IMPLANT
CANNULA 8.5X75 THRED (CANNULA) IMPLANT
CANNULA SHAVER 8MMX76MM (CANNULA) IMPLANT
CAP LOCK ULTRA CANNULA (MISCELLANEOUS) ×2 IMPLANT
CUTTER CANN W/HOLE 4.5 (CUTTER) IMPLANT
CUTTER SLOTTED WHISKER 4.0 (BURR) IMPLANT
DRAPE STERI 35X30 U-POUCH (DRAPES) ×2 IMPLANT
DURAPREP 26ML APPLICATOR (WOUND CARE) ×2 IMPLANT
GAUZE SPONGE 4X4 12PLY STRL (GAUZE/BANDAGES/DRESSINGS) ×2 IMPLANT
GLOVE BIO SURGEON STRL SZ7.5 (GLOVE) ×2 IMPLANT
GLOVE BIO SURGEON STRL SZ8 (GLOVE) ×2 IMPLANT
GLOVE INDICATOR 8.0 STRL GRN (GLOVE) ×2 IMPLANT
GOWN STRL REIN 2XL LVL4 (GOWN DISPOSABLE) ×2 IMPLANT
GOWN STRL REUS W/ TWL LRG LVL3 (GOWN DISPOSABLE) ×1 IMPLANT
GOWN STRL REUS W/TWL LRG LVL3 (GOWN DISPOSABLE) ×1
IV LACTATED RINGER IRRG 3000ML (IV SOLUTION) ×1
IV LR IRRIG 3000ML ARTHROMATIC (IV SOLUTION) ×1 IMPLANT
KIT SHOULDER TRACTION (DRAPES) ×2 IMPLANT
MANIFOLD 4PT FOR NEPTUNE1 (MISCELLANEOUS) ×2 IMPLANT
NEEDLE 18GX1X1/2 (RX/OR ONLY) (NEEDLE) IMPLANT
NEEDLE MAYO CATGUT SZ 1.5 (NEEDLE)
NEEDLE MAYO CATGUT SZ 2 (NEEDLE) IMPLANT
NEEDLE SPNL 18GX3.5 QUINCKE PK (NEEDLE) IMPLANT
PACK ARTHROSCOPY SHOULDER (MISCELLANEOUS) ×2 IMPLANT
PAD GROUND ADULT SPLIT (MISCELLANEOUS) ×2 IMPLANT
PASSER SUT CAPTURE FIRST (SUTURE) IMPLANT
SET TUBE SUCT SHAVER OUTFL 24K (TUBING) ×2 IMPLANT
SLING ARM M TX990204 (SOFTGOODS) ×2 IMPLANT
SOL PREP PVP 2OZ (MISCELLANEOUS) ×2
SOLUTION PREP PVP 2OZ (MISCELLANEOUS) ×1 IMPLANT
STAPLER SKIN PROX 35W (STAPLE) IMPLANT
SUT ETHIBOND NAB CT1 #1 30IN (SUTURE) IMPLANT
SUT ETHILON 3-0 FS-10 30 BLK (SUTURE)
SUT MAGNUM WIRE 2 (SUTURE) IMPLANT
SUT PDS AB 1 CT1 27 (SUTURE) IMPLANT
SUT PDSII 0 (SUTURE) IMPLANT
SUT PERFECTPASSER WHITE CART (SUTURE) IMPLANT
SUT PROLENE 2 0 CT2 30 (SUTURE) IMPLANT
SUT SMART STITCH CARTRIDGE (SUTURE) IMPLANT
SUT TICRON 2-0 30IN 311381 (SUTURE) IMPLANT
SUT VIC AB 0 CT1 36 (SUTURE) IMPLANT
SUT VIC AB 0 CT2 27 (SUTURE) IMPLANT
SUT VIC AB 2-0 CT1 27 (SUTURE)
SUT VIC AB 2-0 CT1 TAPERPNT 27 (SUTURE) IMPLANT
SUT VIC AB 2-0 CT2 27 (SUTURE) IMPLANT
SUT VIC AB 2-0 SH 27 (SUTURE)
SUT VIC AB 2-0 SH 27XBRD (SUTURE) IMPLANT
SUT VIC AB 3-0 SH 27 (SUTURE) ×1
SUT VIC AB 3-0 SH 27X BRD (SUTURE) ×1 IMPLANT
SUTURE EHLN 3-0 FS-10 30 BLK (SUTURE) IMPLANT
SUTURE MAGNUM WIRE 2X48 BLK (SUTURE) IMPLANT
SYRINGE 10CC LL (SYRINGE) IMPLANT
TAPE MICROFOAM 4IN (TAPE) ×2 IMPLANT
TUBING ARTHRO INFLOW-ONLY STRL (TUBING) ×2 IMPLANT
WAND 30 DEG SABER W/CORD (SURGICAL WAND) IMPLANT
WAND 90 DEG TURBOVAC W/CORD (SURGICAL WAND) ×2 IMPLANT
WAND ARTHRO PARAGON T2 (SURGICAL WAND) IMPLANT
WAND COVAC 50 IFS (MISCELLANEOUS) IMPLANT
WAND COVATOR 20 (MISCELLANEOUS) IMPLANT
WAND HAND CNTRL MULTIVAC 50 (MISCELLANEOUS) IMPLANT
WAND HAND CNTRL MULTIVAC 90 (MISCELLANEOUS) IMPLANT
WAND MEGAVAC 90 (MISCELLANEOUS) IMPLANT
WAND TENDON TOPAZ 0 ANGL (MISCELLANEOUS) IMPLANT
WAND TOPAZ EPF  WAS Q (MISCELLANEOUS)
WAND TOPAZ EPF WAS Q (MISCELLANEOUS) IMPLANT
WIRE MAGNUM (SUTURE) IMPLANT

## 2014-10-12 NOTE — Transfer of Care (Signed)
Immediate Anesthesia Transfer of Care Note  Patient: Jared Walker  Procedure(s) Performed: Procedure(s): SHOULDER ARTHROSCOP with decompression (Right)  Patient Location: PACU  Anesthesia Type: General, Regional  Level of Consciousness: awake, alert  and patient cooperative  Airway and Oxygen Therapy: Patient Spontanous Breathing and Patient connected to supplemental oxygen  Post-op Assessment: Post-op Vital signs reviewed, Patient's Cardiovascular Status Stable, Respiratory Function Stable, Patent Airway and No signs of Nausea or vomiting  Post-op Vital Signs: Reviewed and stable  Complications: No apparent anesthesia complications

## 2014-10-12 NOTE — Op Note (Signed)
NAME:  Jared Walker, Jared Walker            ACCOUNT NO.:  [id number]  MEDICAL RECORD NO.:  [id number]  LOCATION:  [facility]                        FACILITY:  [facility]  PHYSICIAN:  Vilinda Flake, MD  DATE OF BIRTH:  12/11/1950  DATE OF PROCEDURE:  10/12/2014 DATE OF DISCHARGE:  10/12/2014                              OPERATIVE REPORT   PREOPERATIVE DIAGNOSIS:  Impingement syndrome, right shoulder with bicipital tendinitis and possible cuff tear.  POSTOPERATIVE DIAGNOSIS:  Impingement syndrome, right shoulder with bicipital tendinitis.  PROCEDURE PERFORMED:  Arthroscopic subacromial decompression, plus arthroscopic release of the long head of the biceps tendon.  SURGEON:  Vilinda Flake, MD  ANESTHESIA:  General plus interscalene block.  HISTORY:  The patient had a recent history of an injury to his right shoulder.  He had plain films which failed to reveal any bony abnormality.  MRI was consistent with a possible rotator cuff tear.  The patient was brought in for surgery due to his persistent pain despite conservative treatment.  DESCRIPTION OF PROCEDURE:  In the preop area, the patient had an interscalene block on his right upper extremity.  He was taken to the operating room and satisfactory general anesthesia was achieved.  The patient was turned in the lateral decubitus position with the right shoulder up.  The right shoulder was prepped and draped in the usual fashion for a procedure about the shoulder.  The patient was given 2 g of Kefzol IV prior to the start of the procedure.  The scope was introduced in through a posterior puncture wound in the glenohumeral joint.  The joint was distended with Lactated Ringers. Inspection of the glenohumeral joint revealed the articular surface was smooth.  The labrum was frayed superiorly and anteriorly.  I established an anterior portal and brought an angled ArthroCare wand in through this portal and used it to debride the frayed labrum  and to divide the long head of the biceps tendon at its attachment to the labrum.  Following this, the scope was introduced in the subacromial space.  The patient had some fraying of the undersurface of the acromion.  There was a significant spur about the inferior aspect of the anterior acromion. The cuff was slightly frayed but no obvious tear was appreciated.  I went ahead and established a lateral portal and inserted an ArthroCare wand to debride some of the thickened synovial tissue, and also inserted a synovial resector to further debride tissue on the undersurface of the acromion.  The scope was then switched to the lateral portal, and I brought in an acromionizer bur into the posterior portal and performed a subacromial decompression without difficulty.  The scope was then switched back to the posterior portal and I brought the acromionizer burr through the lateral portal and removed the anterior portion of the prominent distal clavicle.  I then reevaluated the cuff.  Once again, no cuff tear could be appreciated.  The instruments were removed from the shoulder and then the puncture wounds were closed with 3-0 nylon vertical mattress fashion.  Betadine applied to the wounds, followed  by sterile dressing and then a sling was applied to the patient's right upper extremity.  He was awakened and transferred  to a stretcher bed.  He was taken to the recovery room in satisfactory condition.  Blood loss was negligible.          ______________________________ Vilinda Flake, MD     HBK/MEDQ  D:  10/12/2014  T:  10/12/2014  Job:  [id number]

## 2014-10-12 NOTE — Anesthesia Procedure Notes (Addendum)
Anesthesia Regional Block:  Interscalene brachial plexus block  Pre-Anesthetic Checklist: ,, timeout performed, Correct Patient, Correct Site, Correct Laterality, Correct Procedure, Correct Position, site marked, Risks and benefits discussed,  Surgical consent,  Pre-op evaluation,  At surgeon's request and post-op pain management   Prep: chloraprep       Needles:  Injection technique: Single-shot  Needle Type: Stimiplex     Needle Length: 10cm 10 cm Needle Gauge: 21 and 21 G  Needle insertion depth: 3 cm   Additional Needles:  Procedures: ultrasound guided (picture in chart) Interscalene brachial plexus block Narrative:  Start time: 10/12/2014 8:55 AM End time: 10/12/2014 9:04 AM Injection made incrementally with aspirations every 5 mL.  Performed by: Personally   Additional Notes: Functioning IV was confirmed and monitors applied. Ultrasound guidance: relevant anatomy identified, needle position confirmed, local anesthetic spread visualized around nerve(s)., vascular puncture avoided.  Image printed for medical record.  Negative aspiration and no paresthesias; incremental administration of local anesthetic. The patient tolerated the procedure well. Vitals signes recorded in RN notes.   Procedure Name: LMA Insertion Date/Time: 10/12/2014 9:44 AM Performed by: Londell Moh Pre-anesthesia Checklist: Patient identified, Emergency Drugs available, Suction available, Timeout performed and Patient being monitored Patient Re-evaluated:Patient Re-evaluated prior to inductionOxygen Delivery Method: Circle system utilized Preoxygenation: Pre-oxygenation with 100% oxygen Intubation Type: IV induction LMA: LMA inserted LMA Size: 4.0 Number of attempts: 1 Placement Confirmation: positive ETCO2 and breath sounds checked- equal and bilateral Tube secured with: Tape

## 2014-10-12 NOTE — H&P (Signed)
  H and P reviewed. No changes. Uploaded at later date. 

## 2014-10-12 NOTE — Discharge Instructions (Signed)
General Anesthesia, Care After Refer to this sheet in the next few weeks. These instructions provide you with information on caring for yourself after your procedure. Your health care provider may also give you more specific instructions. Your treatment has been planned according to current medical practices, but problems sometimes occur. Call your health care provider if you have any problems or questions after your procedure. WHAT TO EXPECT AFTER THE PROCEDURE After the procedure, it is typical to experience:  Sleepiness.  Nausea and vomiting. HOME CARE INSTRUCTIONS  For the first 24 hours after general anesthesia:  Have a responsible person with you.  Do not drive a car. If you are alone, do not take public transportation.  Do not drink alcohol.  Do not take medicine that has not been prescribed by your health care provider.  Do not sign important papers or make important decisions.  You may resume a normal diet and activities as directed by your health care provider.  Change bandages (dressings) as directed.  If you have questions or problems that seem related to general anesthesia, call the hospital and ask for the anesthetist or anesthesiologist on call. SEEK MEDICAL CARE IF:  You have nausea and vomiting that continue the day after anesthesia.  You develop a rash. SEEK IMMEDIATE MEDICAL CARE IF:   You have difficulty breathing.  You have chest pain.  You have any allergic problems.  Document Released: 08/24/2000 Document Revised: 05/23/2013 Document Reviewed: 12/01/2012 [facility] Patient Information 2015 [place], [place]. This information is not intended to replace advice given to you by your health care provider. Make sure you discuss any questions you have with your health care provider. Diet: As you were doing prior to hospitalization   Shower:  May shower but keep the wounds dry, use an occlusive plastic wrap or extremity protector. NO SOAKING IN TUB.    Dressing:  You may remove your dressing 3 days after surgery. Then apply waterproof bandaids and change them after showering.   Activity:  Increase activity slowly as tolerated. Can drive when comfortable.    Sling: D/C when comfortable    RTC: 1 week  Ice pack as needed   To prevent constipation: you may use a stool softener such as - Miralax (over the counter) for constipation as needed.    To prevent venous clotting Take one 81 mg. ASA tablet  2X per day for about 2 weeks post surgery.  Precautions:  If you experience chest pain or shortness of breath - call 911 immediately for transfer to the hospital emergency department!!  If you develop a fever greater that 101 F, purulent drainage from wound, increased redness or drainage from wound, or calf pain -- Call the office at [phone]                                             Follow- Up Appointment:  Please call for an appointment to be seen in 1 wk.

## 2014-10-12 NOTE — Anesthesia Postprocedure Evaluation (Signed)
  Anesthesia Post-op Note  Patient: Jared Walker  Procedure(s) Performed: Procedure(s): SHOULDER ARTHROSCOP with decompression (Right)  Anesthesia type:General, Regional  Patient location: PACU  Post pain: Pain level controlled  Post assessment: Post-op Vital signs reviewed, Patient's Cardiovascular Status Stable, Respiratory Function Stable, Patent Airway and No signs of Nausea or vomiting  Post vital signs: Reviewed and stable  Last Vitals:  Filed Vitals:   10/12/14 1130  BP:   Pulse:   Temp:   Resp: 16    Level of consciousness: awake, alert  and patient cooperative  Complications: No apparent anesthesia complications

## 2014-11-13 DIAGNOSIS — Z9889 Other specified postprocedural states: Secondary | ICD-10-CM | POA: Insufficient documentation

## 2015-01-18 ENCOUNTER — Inpatient Hospital Stay
Admission: EM | Admit: 2015-01-18 | Discharge: 2015-01-23 | DRG: 380 | Disposition: A | Discharge status: Home / Self Care | Payer: Medicare Other | Attending: Internal Medicine | Admitting: Internal Medicine

## 2015-01-18 ENCOUNTER — Emergency Department: Payer: Medicare Other

## 2015-01-18 ENCOUNTER — Encounter: Payer: Self-pay | Admitting: Emergency Medicine

## 2015-01-18 DIAGNOSIS — N183 Chronic kidney disease, stage 3 unspecified: Secondary | ICD-10-CM | POA: Diagnosis present

## 2015-01-18 DIAGNOSIS — M549 Dorsalgia, unspecified: Secondary | ICD-10-CM | POA: Diagnosis present

## 2015-01-18 DIAGNOSIS — Z8249 Family history of ischemic heart disease and other diseases of the circulatory system: Secondary | ICD-10-CM

## 2015-01-18 DIAGNOSIS — K2970 Gastritis, unspecified, without bleeding: Secondary | ICD-10-CM | POA: Diagnosis present

## 2015-01-18 DIAGNOSIS — K922 Gastrointestinal hemorrhage, unspecified: Secondary | ICD-10-CM | POA: Diagnosis present

## 2015-01-18 DIAGNOSIS — K219 Gastro-esophageal reflux disease without esophagitis: Secondary | ICD-10-CM | POA: Diagnosis present

## 2015-01-18 DIAGNOSIS — B259 Cytomegaloviral disease, unspecified: Secondary | ICD-10-CM | POA: Diagnosis present

## 2015-01-18 DIAGNOSIS — D631 Anemia in chronic kidney disease: Secondary | ICD-10-CM | POA: Diagnosis present

## 2015-01-18 DIAGNOSIS — R011 Cardiac murmur, unspecified: Secondary | ICD-10-CM | POA: Diagnosis present

## 2015-01-18 DIAGNOSIS — D649 Anemia, unspecified: Secondary | ICD-10-CM

## 2015-01-18 DIAGNOSIS — Z79899 Other long term (current) drug therapy: Secondary | ICD-10-CM

## 2015-01-18 DIAGNOSIS — K2210 Ulcer of esophagus without bleeding: Secondary | ICD-10-CM | POA: Diagnosis not present

## 2015-01-18 DIAGNOSIS — Z885 Allergy status to narcotic agent status: Secondary | ICD-10-CM

## 2015-01-18 DIAGNOSIS — N189 Chronic kidney disease, unspecified: Secondary | ICD-10-CM | POA: Diagnosis present

## 2015-01-18 DIAGNOSIS — Z87442 Personal history of urinary calculi: Secondary | ICD-10-CM

## 2015-01-18 DIAGNOSIS — Z809 Family history of malignant neoplasm, unspecified: Secondary | ICD-10-CM

## 2015-01-18 DIAGNOSIS — Z833 Family history of diabetes mellitus: Secondary | ICD-10-CM

## 2015-01-18 DIAGNOSIS — Z888 Allergy status to other drugs, medicaments and biological substances status: Secondary | ICD-10-CM

## 2015-01-18 DIAGNOSIS — Z87891 Personal history of nicotine dependence: Secondary | ICD-10-CM

## 2015-01-18 DIAGNOSIS — R0789 Other chest pain: Secondary | ICD-10-CM | POA: Diagnosis present

## 2015-01-18 DIAGNOSIS — Z79891 Long term (current) use of opiate analgesic: Secondary | ICD-10-CM

## 2015-01-18 DIAGNOSIS — G8929 Other chronic pain: Secondary | ICD-10-CM | POA: Diagnosis present

## 2015-01-18 DIAGNOSIS — B182 Chronic viral hepatitis C: Secondary | ICD-10-CM | POA: Clinically undetermined

## 2015-01-18 DIAGNOSIS — K5900 Constipation, unspecified: Secondary | ICD-10-CM | POA: Diagnosis present

## 2015-01-18 DIAGNOSIS — R1310 Dysphagia, unspecified: Secondary | ICD-10-CM

## 2015-01-18 DIAGNOSIS — F5083 Pica in adults: Secondary | ICD-10-CM | POA: Diagnosis present

## 2015-01-18 DIAGNOSIS — Z21 Asymptomatic human immunodeficiency virus [HIV] infection status: Secondary | ICD-10-CM | POA: Diagnosis present

## 2015-01-18 DIAGNOSIS — I129 Hypertensive chronic kidney disease with stage 1 through stage 4 chronic kidney disease, or unspecified chronic kidney disease: Secondary | ICD-10-CM | POA: Diagnosis present

## 2015-01-18 DIAGNOSIS — R079 Chest pain, unspecified: Secondary | ICD-10-CM

## 2015-01-18 DIAGNOSIS — F508 Other eating disorders: Secondary | ICD-10-CM | POA: Diagnosis present

## 2015-01-18 DIAGNOSIS — I10 Essential (primary) hypertension: Secondary | ICD-10-CM | POA: Diagnosis present

## 2015-01-18 DIAGNOSIS — Z9889 Other specified postprocedural states: Secondary | ICD-10-CM

## 2015-01-18 DIAGNOSIS — R195 Other fecal abnormalities: Secondary | ICD-10-CM | POA: Diagnosis present

## 2015-01-18 DIAGNOSIS — B20 Human immunodeficiency virus [HIV] disease: Secondary | ICD-10-CM

## 2015-01-18 DIAGNOSIS — F5089 Other specified eating disorder: Secondary | ICD-10-CM

## 2015-01-18 DIAGNOSIS — N1832 Chronic kidney disease, stage 3b: Secondary | ICD-10-CM | POA: Diagnosis present

## 2015-01-18 DIAGNOSIS — R748 Abnormal levels of other serum enzymes: Secondary | ICD-10-CM | POA: Diagnosis present

## 2015-01-18 DIAGNOSIS — N289 Disorder of kidney and ureter, unspecified: Secondary | ICD-10-CM

## 2015-01-18 DIAGNOSIS — F329 Major depressive disorder, single episode, unspecified: Secondary | ICD-10-CM | POA: Diagnosis present

## 2015-01-18 DIAGNOSIS — Z9049 Acquired absence of other specified parts of digestive tract: Secondary | ICD-10-CM | POA: Diagnosis present

## 2015-01-18 DIAGNOSIS — M109 Gout, unspecified: Secondary | ICD-10-CM | POA: Diagnosis present

## 2015-01-18 HISTORY — DX: Chronic viral hepatitis C: B18.2

## 2015-01-18 HISTORY — DX: Depression, unspecified: F32.A

## 2015-01-18 HISTORY — DX: Anemia, unspecified: D64.9

## 2015-01-18 HISTORY — DX: Major depressive disorder, single episode, unspecified: F32.9

## 2015-01-18 LAB — CBC
HCT: 25.1 % — ABNORMAL LOW (ref 40.0–52.0)
Hemoglobin: 7.9 g/dL — ABNORMAL LOW (ref 13.0–18.0)
MCH: 25.8 pg — ABNORMAL LOW (ref 26.0–34.0)
MCHC: 31.5 g/dL — ABNORMAL LOW (ref 32.0–36.0)
MCV: 81.8 fL (ref 80.0–100.0)
PLATELETS: 243 10*3/uL (ref 150–440)
RBC: 3.07 MIL/uL — AB (ref 4.40–5.90)
RDW: 17.6 % — ABNORMAL HIGH (ref 11.5–14.5)
WBC: 8.7 10*3/uL (ref 3.8–10.6)

## 2015-01-18 NOTE — ED Notes (Addendum)
Pt's wife report to other staff that pt took his night medications and asking for water, pills in mouth.  Pt given small amount of water.Marland Kitchen

## 2015-01-18 NOTE — ED Notes (Signed)
Pt states mid chest pain that began "today this afternoon". Pt states pain is pressure like, and "i can't eat or drink anything". Pt states did feel short of breath and nauseated. Pt states did have diaphoresis.

## 2015-01-18 NOTE — ED Provider Notes (Signed)
[facility] Emergency Department Provider Note  ____________________________________________  Time seen: Approximately 11:03 PM  I have reviewed the triage vital signs and the nursing notes.   HISTORY  Chief Complaint Chest Pain    HPI Jared Walker is a 64 y.o. male who presents to the ED from home with a chief complaint of mid chest pain. Pain began between 8 AM to noon after patient swallowed a drink of diet Pepsi. Patient reports pressure-like sensation to his mid chest; states he did feel short of breath and nauseated and had "a bead of sweat". Since thenpatient has experienced similar symptoms specifically related to eating or drinking. Patient does have a past medical history significant for HIV and chronic kidney disease; no prior history of CAD. Of note, wife reports that patient tears strips of typing paper and eats them habitually. States this has been going on for over a year. Reports his habit has been escalating, and that patient will go through two reams of paper daily.   Past Medical History  Diagnosis Date  . Gout   . Hypertension   . Heart murmur   . Aortic stenosis, mild     mild AS by echo, 08/2011  . Chronic kidney disease     kidney stones  . HIV positive 09/25/11  . Hepatitis C 09/25/11  . Full dentures     upper and lower  . Hep C w/o coma, chronic 01/19/2015  . Anemia   . Depression     Patient Active Problem List   Diagnosis Date Noted  . Chest pain 01/19/2015  . Anemia 01/19/2015  . GI bleed 01/19/2015  . Pica in adults 01/19/2015  . CKD (chronic kidney disease), stage III 01/19/2015  . HIV (human immunodeficiency virus infection) 01/19/2015  . HTN (hypertension) 01/19/2015  . Hep C w/o coma, chronic 01/19/2015  . CKD (chronic kidney disease) 01/19/2015    Past Surgical History  Procedure Laterality Date  . Cholecystectomy    . Closed reduction pelvic fracture    . Cardiovascular stress test      at [facility]  .  Knee arthroscopy  2009    Right  . Nephrostomy      tube Left  . Lumbar laminectomy/decompression microdiscectomy  09/25/2011    Procedure: LUMBAR LAMINECTOMY/DECOMPRESSION MICRODISCECTOMY;  Surgeon: Erline Levine, MD;  Location: [place] [facility];  Service: Neurosurgery;  Laterality: Left;  Left Lumbar Four-Five Microdiskectomy  . Shoulder arthroscopy with open rotator cuff repair Right 10/12/2014    Procedure: SHOULDER ARTHROSCOP with decompression;  Surgeon: Leanor Kail, MD;  Location: [place];  Service: Orthopedics;  Laterality: Right;  . Fracture surgery    . Back surgery      No current outpatient prescriptions on file.  Allergies Buchu-cornsilk-ch grass-hydran; Colchicine; Nifedipine; Oxycodone-acetaminophen; and Tramadol  Family History  Problem Relation Age of Onset  . Anesthesia problems Neg Hx   . Hypertension Mother   . Cancer Father   . Heart disease Sister   . Diabetes Brother     Social History Social History  Substance Use Topics  . Smoking status: Former Research scientist (life sciences)  . Smokeless tobacco: Former Systems developer    Quit date: 03/13/1972  . Alcohol Use: No    Review of Systems Constitutional: No fever/chills Eyes: No visual changes. ENT: No sore throat. Cardiovascular: Positive for chest pain. Respiratory: Positive for shortness of breath. Gastrointestinal: No abdominal pain.  No nausea, no vomiting.  No diarrhea.  No constipation. Genitourinary: Negative for dysuria.  Musculoskeletal: Negative for back pain. Skin: Negative for rash. Neurological: Negative for headaches, focal weakness or numbness. Psychiatric: Positive for depression due to disability. Negative for SI/HI/AH/VH.  10-point ROS otherwise negative.  ____________________________________________   PHYSICAL EXAM:  VITAL SIGNS: ED Triage Vitals  Enc Vitals Group     BP 01/18/15 2200 141/74 mmHg     Pulse Rate 01/18/15 2200 72     Resp 01/18/15 2200 14     Temp 01/18/15 2200 98.1 F (36.7  C)     Temp Source 01/18/15 2200 Oral     SpO2 01/18/15 2200 100 %     Weight 01/18/15 2200 170 lb (77.111 kg)     Height 01/18/15 2200 5\' 11"  (1.803 m)     Head Cir --      Peak Flow --      Pain Score 01/18/15 2201 9     Pain Loc --      Pain Edu? --      Excl. in [place]? --     Constitutional: Alert and oriented. Well appearing and in no acute distress. Eyes: Conjunctivae are normal. PERRL. EOMI. Head: Atraumatic. Nose: No congestion/rhinnorhea. Mouth/Throat: Mucous membranes are moist.  Oropharynx non-erythematous. Neck: No stridor.   Cardiovascular: Normal rate, regular rhythm. Grossly normal heart sounds.  Good peripheral circulation. Respiratory: Normal respiratory effort.  No retractions. Lungs CTAB. Gastrointestinal: Soft and nontender. No distention. No abdominal bruits. No CVA tenderness. Musculoskeletal: No lower extremity tenderness nor edema.  No joint effusions. Neurologic:  Normal speech and language. No gross focal neurologic deficits are appreciated. No gait instability. Skin:  Skin is warm, dry and intact. No rash noted. Psychiatric: Mood and affect are normal. Speech and behavior are flat.  ____________________________________________   LABS (all labs ordered are listed, but only abnormal results are displayed)  Labs Reviewed  CBC - Abnormal; Notable for the following:    RBC 3.07 (*)    Hemoglobin 7.9 (*)    HCT 25.1 (*)    MCH 25.8 (*)    MCHC 31.5 (*)    RDW 17.6 (*)    All other components within normal limits  BASIC METABOLIC PANEL - Abnormal; Notable for the following:    Glucose, Bld 103 (*)    BUN 24 (*)    Creatinine, Ser 2.20 (*)    GFR calc non Af Amer 30 (*)    GFR calc Af Amer 35 (*)    All other components within normal limits  HEPATIC FUNCTION PANEL - Abnormal; Notable for the following:    AST 51 (*)    Total Bilirubin 0.1 (*)    Bilirubin, Direct <0.1 (*)    All other components within normal limits  LIPASE, BLOOD - Abnormal;  Notable for the following:    Lipase 110 (*)    All other components within normal limits  CBC - Abnormal; Notable for the following:    RBC 3.06 (*)    Hemoglobin 7.9 (*)    HCT 25.1 (*)    MCH 25.6 (*)    MCHC 31.3 (*)    RDW 18.2 (*)    All other components within normal limits  BASIC METABOLIC PANEL - Abnormal; Notable for the following:    Chloride 112 (*)    Glucose, Bld 104 (*)    Creatinine, Ser 2.00 (*)    Calcium 8.5 (*)    GFR calc non Af Amer 34 (*)    GFR calc Af Amer 39 (*)  Anion gap 4 (*)    All other components within normal limits  TROPONIN I  ETHANOL  CBC  CBC  CBC  CBC  CBC  TYPE AND SCREEN  ABO/RH   ____________________________________________  EKG  ED ECG REPORT I, Yosgar Demirjian J, the attending physician, personally viewed and interpreted this ECG.   Date: 01/19/2015  EKG Time: 2201  Rate: 74  Rhythm: normal EKG, normal sinus rhythm  Axis: Normal  Intervals:none  ST&T Change: Nonspecific  ____________________________________________  RADIOLOGY  Chest 2 view (view by me, interpreted per Dr. Pascal Lux): No active cardiopulmonary disease. ____________________________________________   PROCEDURES  Procedure(s) performed: None  Critical Care performed: No  ____________________________________________   INITIAL IMPRESSION / ASSESSMENT AND PLAN / ED COURSE  Pertinent labs & imaging results that were available during my care of the patient were reviewed by me and considered in my medical decision making (see chart for details).  64 year old male with history of HIV who presents with midsternal chest pain brought on by drinking diet Pepsi. Patient also has a habit of eating copy paper. Symptoms suggestive of esophageal origin. Will proceed with workup including troponin, chest x-ray; will ask TTS to evaluate patient in the emergency department. ____________________________________________   FINAL CLINICAL IMPRESSION(S) / ED  DIAGNOSES  Final diagnoses:  Chest pain, unspecified chest pain type  Anemia, unspecified anemia type  Renal insufficiency  HIV (human immunodeficiency virus infection)  Pica in adults  Upper GI bleed      Paulette Blanch, MD 01/19/15 [phone]

## 2015-01-19 ENCOUNTER — Encounter: Payer: Self-pay | Admitting: Internal Medicine

## 2015-01-19 DIAGNOSIS — B182 Chronic viral hepatitis C: Secondary | ICD-10-CM | POA: Diagnosis present

## 2015-01-19 DIAGNOSIS — Z809 Family history of malignant neoplasm, unspecified: Secondary | ICD-10-CM | POA: Diagnosis not present

## 2015-01-19 DIAGNOSIS — M549 Dorsalgia, unspecified: Secondary | ICD-10-CM | POA: Diagnosis present

## 2015-01-19 DIAGNOSIS — N183 Chronic kidney disease, stage 3 unspecified: Secondary | ICD-10-CM | POA: Diagnosis present

## 2015-01-19 DIAGNOSIS — Z833 Family history of diabetes mellitus: Secondary | ICD-10-CM | POA: Diagnosis not present

## 2015-01-19 DIAGNOSIS — N1832 Chronic kidney disease, stage 3b: Secondary | ICD-10-CM | POA: Diagnosis present

## 2015-01-19 DIAGNOSIS — Z87442 Personal history of urinary calculi: Secondary | ICD-10-CM | POA: Diagnosis not present

## 2015-01-19 DIAGNOSIS — Z885 Allergy status to narcotic agent status: Secondary | ICD-10-CM | POA: Diagnosis not present

## 2015-01-19 DIAGNOSIS — R195 Other fecal abnormalities: Secondary | ICD-10-CM | POA: Diagnosis present

## 2015-01-19 DIAGNOSIS — Z9049 Acquired absence of other specified parts of digestive tract: Secondary | ICD-10-CM | POA: Diagnosis present

## 2015-01-19 DIAGNOSIS — M109 Gout, unspecified: Secondary | ICD-10-CM | POA: Diagnosis present

## 2015-01-19 DIAGNOSIS — I129 Hypertensive chronic kidney disease with stage 1 through stage 4 chronic kidney disease, or unspecified chronic kidney disease: Secondary | ICD-10-CM | POA: Diagnosis present

## 2015-01-19 DIAGNOSIS — D631 Anemia in chronic kidney disease: Secondary | ICD-10-CM | POA: Diagnosis present

## 2015-01-19 DIAGNOSIS — F329 Major depressive disorder, single episode, unspecified: Secondary | ICD-10-CM | POA: Diagnosis present

## 2015-01-19 DIAGNOSIS — F508 Other eating disorders: Secondary | ICD-10-CM | POA: Diagnosis present

## 2015-01-19 DIAGNOSIS — K2210 Ulcer of esophagus without bleeding: Secondary | ICD-10-CM | POA: Diagnosis present

## 2015-01-19 DIAGNOSIS — D649 Anemia, unspecified: Secondary | ICD-10-CM | POA: Diagnosis present

## 2015-01-19 DIAGNOSIS — Z79899 Other long term (current) drug therapy: Secondary | ICD-10-CM | POA: Diagnosis not present

## 2015-01-19 DIAGNOSIS — Z888 Allergy status to other drugs, medicaments and biological substances status: Secondary | ICD-10-CM | POA: Diagnosis not present

## 2015-01-19 DIAGNOSIS — R1310 Dysphagia, unspecified: Secondary | ICD-10-CM | POA: Diagnosis present

## 2015-01-19 DIAGNOSIS — Z79891 Long term (current) use of opiate analgesic: Secondary | ICD-10-CM | POA: Diagnosis not present

## 2015-01-19 DIAGNOSIS — Z8249 Family history of ischemic heart disease and other diseases of the circulatory system: Secondary | ICD-10-CM | POA: Diagnosis not present

## 2015-01-19 DIAGNOSIS — Z21 Asymptomatic human immunodeficiency virus [HIV] infection status: Secondary | ICD-10-CM | POA: Diagnosis present

## 2015-01-19 DIAGNOSIS — K219 Gastro-esophageal reflux disease without esophagitis: Secondary | ICD-10-CM | POA: Diagnosis present

## 2015-01-19 DIAGNOSIS — B20 Human immunodeficiency virus [HIV] disease: Secondary | ICD-10-CM | POA: Diagnosis present

## 2015-01-19 DIAGNOSIS — G8929 Other chronic pain: Secondary | ICD-10-CM | POA: Diagnosis present

## 2015-01-19 DIAGNOSIS — N189 Chronic kidney disease, unspecified: Secondary | ICD-10-CM | POA: Diagnosis present

## 2015-01-19 DIAGNOSIS — R011 Cardiac murmur, unspecified: Secondary | ICD-10-CM | POA: Diagnosis present

## 2015-01-19 DIAGNOSIS — F5089 Other specified eating disorder: Secondary | ICD-10-CM | POA: Diagnosis present

## 2015-01-19 DIAGNOSIS — Z87891 Personal history of nicotine dependence: Secondary | ICD-10-CM | POA: Diagnosis not present

## 2015-01-19 DIAGNOSIS — K2970 Gastritis, unspecified, without bleeding: Secondary | ICD-10-CM | POA: Diagnosis present

## 2015-01-19 DIAGNOSIS — K922 Gastrointestinal hemorrhage, unspecified: Secondary | ICD-10-CM | POA: Diagnosis present

## 2015-01-19 DIAGNOSIS — Z9889 Other specified postprocedural states: Secondary | ICD-10-CM | POA: Diagnosis not present

## 2015-01-19 DIAGNOSIS — K5900 Constipation, unspecified: Secondary | ICD-10-CM | POA: Diagnosis present

## 2015-01-19 DIAGNOSIS — I10 Essential (primary) hypertension: Secondary | ICD-10-CM | POA: Diagnosis present

## 2015-01-19 DIAGNOSIS — B259 Cytomegaloviral disease, unspecified: Secondary | ICD-10-CM | POA: Diagnosis present

## 2015-01-19 DIAGNOSIS — R748 Abnormal levels of other serum enzymes: Secondary | ICD-10-CM | POA: Diagnosis present

## 2015-01-19 DIAGNOSIS — R0789 Other chest pain: Secondary | ICD-10-CM | POA: Diagnosis present

## 2015-01-19 HISTORY — DX: Chronic viral hepatitis C: B18.2

## 2015-01-19 LAB — CBC
HCT: 25.1 % — ABNORMAL LOW (ref 40.0–52.0)
HCT: 23.3 % — ABNORMAL LOW (ref 40.0–52.0)
HEMATOCRIT: 23.7 % — AB (ref 40.0–52.0)
HEMATOCRIT: 22.4 % — AB (ref 40.0–52.0)
HEMOGLOBIN: 7.3 g/dL — AB (ref 13.0–18.0)
HEMOGLOBIN: 7.3 g/dL — AB (ref 13.0–18.0)
HEMOGLOBIN: 6.9 g/dL — AB (ref 13.0–18.0)
Hemoglobin: 7.9 g/dL — ABNORMAL LOW (ref 13.0–18.0)
MCH: 25.6 pg — ABNORMAL LOW (ref 26.0–34.0)
MCH: 25.7 pg — ABNORMAL LOW (ref 26.0–34.0)
MCH: 25.2 pg — ABNORMAL LOW (ref 26.0–34.0)
MCH: 25 pg — ABNORMAL LOW (ref 26.0–34.0)
MCHC: 31.3 g/dL — ABNORMAL LOW (ref 32.0–36.0)
MCHC: 31.2 g/dL — AB (ref 32.0–36.0)
MCHC: 30.7 g/dL — ABNORMAL LOW (ref 32.0–36.0)
MCHC: 30.6 g/dL — AB (ref 32.0–36.0)
MCV: 81.9 fL (ref 80.0–100.0)
MCV: 82.2 fL (ref 80.0–100.0)
MCV: 82.2 fL (ref 80.0–100.0)
MCV: 81.9 fL (ref 80.0–100.0)
PLATELETS: 200 10*3/uL (ref 150–440)
PLATELETS: 201 10*3/uL (ref 150–440)
Platelets: 192 10*3/uL (ref 150–440)
Platelets: 185 10*3/uL (ref 150–440)
RBC: 3.06 MIL/uL — ABNORMAL LOW (ref 4.40–5.90)
RBC: 2.84 MIL/uL — AB (ref 4.40–5.90)
RBC: 2.88 MIL/uL — AB (ref 4.40–5.90)
RBC: 2.74 MIL/uL — ABNORMAL LOW (ref 4.40–5.90)
RDW: 18.2 % — AB (ref 11.5–14.5)
RDW: 17.8 % — ABNORMAL HIGH (ref 11.5–14.5)
RDW: 17.5 % — ABNORMAL HIGH (ref 11.5–14.5)
RDW: 17.7 % — ABNORMAL HIGH (ref 11.5–14.5)
WBC: 6.2 10*3/uL (ref 3.8–10.6)
WBC: 6 10*3/uL (ref 3.8–10.6)
WBC: 6.9 10*3/uL (ref 3.8–10.6)
WBC: 6.2 10*3/uL (ref 3.8–10.6)

## 2015-01-19 LAB — HEPATIC FUNCTION PANEL
ALBUMIN: 3.7 g/dL (ref 3.5–5.0)
ALT: 42 U/L (ref 17–63)
AST: 51 U/L — ABNORMAL HIGH (ref 15–41)
Alkaline Phosphatase: 82 U/L (ref 38–126)
Bilirubin, Direct: <0.1 mg/dL — ABNORMAL LOW (ref 0.1–0.5)
Total Bilirubin: 0.1 mg/dL — ABNORMAL LOW (ref 0.3–1.2)
Total Protein: 7.2 g/dL (ref 6.5–8.1)

## 2015-01-19 LAB — VITAMIN B12: Vitamin B-12: 329 pg/mL (ref 180–914)

## 2015-01-19 LAB — IRON AND TIBC
Iron: 96 ug/dL (ref 45–182)
Saturation Ratios: 26 % (ref 17.9–39.5)
TIBC: 374 ug/dL (ref 250–450)
UIBC: 278 ug/dL

## 2015-01-19 LAB — BASIC METABOLIC PANEL
ANION GAP: 6 (ref 5–15)
Anion gap: 4 — ABNORMAL LOW (ref 5–15)
BUN: 24 mg/dL — ABNORMAL HIGH (ref 6–20)
BUN: 20 mg/dL (ref 6–20)
CHLORIDE: 112 mmol/L — AB (ref 101–111)
CO2: 26 mmol/L (ref 22–32)
CO2: 25 mmol/L (ref 22–32)
CREATININE: 2 mg/dL — AB (ref 0.61–1.24)
Calcium: 9 mg/dL (ref 8.9–10.3)
Calcium: 8.5 mg/dL — ABNORMAL LOW (ref 8.9–10.3)
Chloride: 109 mmol/L (ref 101–111)
Creatinine, Ser: 2.2 mg/dL — ABNORMAL HIGH (ref 0.61–1.24)
GFR calc non Af Amer: 34 mL/min — ABNORMAL LOW (ref >60)
GFR, EST AFRICAN AMERICAN: 35 mL/min — AB (ref >60)
GFR, EST AFRICAN AMERICAN: 39 mL/min — AB (ref >60)
GFR, EST NON AFRICAN AMERICAN: 30 mL/min — AB (ref >60)
Glucose, Bld: 103 mg/dL — ABNORMAL HIGH (ref 65–99)
Glucose, Bld: 104 mg/dL — ABNORMAL HIGH (ref 65–99)
Potassium: 3.5 mmol/L (ref 3.5–5.1)
Potassium: 3.7 mmol/L (ref 3.5–5.1)
Sodium: 141 mmol/L (ref 135–145)
Sodium: 141 mmol/L (ref 135–145)

## 2015-01-19 LAB — RETICULOCYTES
RBC.: 2.88 MIL/uL — ABNORMAL LOW (ref 4.40–5.90)
Retic Count, Absolute: 20.2 10*3/uL (ref 19.0–183.0)
Retic Ct Pct: 0.7 % (ref 0.4–3.1)

## 2015-01-19 LAB — ETHANOL

## 2015-01-19 LAB — PREPARE RBC (CROSSMATCH)

## 2015-01-19 LAB — FOLATE: Folate: 36 ng/mL (ref >5.9)

## 2015-01-19 LAB — ABO/RH: ABO/RH(D): B POS

## 2015-01-19 LAB — LIPASE, BLOOD: Lipase: 110 U/L — ABNORMAL HIGH (ref 22–51)

## 2015-01-19 LAB — TROPONIN I

## 2015-01-19 LAB — FERRITIN: Ferritin: 8 ng/mL — ABNORMAL LOW (ref 24–336)

## 2015-01-19 MED ORDER — OXYCODONE-ACETAMINOPHEN 5-325 MG PO TABS
1.0000 | ORAL_TABLET | Freq: Two times a day (BID) | ORAL | Status: DC
  Administered 2015-01-19 – 2015-01-23 (×9): 1 via ORAL
  Filled 2015-01-19 (×9): qty 1

## 2015-01-19 MED ORDER — FUROSEMIDE 10 MG/ML IJ SOLN
20.0000 mg | Freq: Once | INTRAMUSCULAR | Status: AC
  Administered 2015-01-20: 03:00:00 20 mg via INTRAVENOUS
  Filled 2015-01-19: qty 2

## 2015-01-19 MED ORDER — FERROUS SULFATE 325 (65 FE) MG PO TABS
325.0000 mg | ORAL_TABLET | Freq: Two times a day (BID) | ORAL | Status: DC
  Administered 2015-01-19 – 2015-01-23 (×9): 325 mg via ORAL
  Filled 2015-01-19 (×9): qty 1

## 2015-01-19 MED ORDER — GI COCKTAIL ~~LOC~~
30.0000 mL | Freq: Once | ORAL | Status: AC
  Administered 2015-01-19: 30 mL via ORAL
  Filled 2015-01-19: qty 30

## 2015-01-19 MED ORDER — DOXAZOSIN MESYLATE 2 MG PO TABS
8.0000 mg | ORAL_TABLET | Freq: Every day | ORAL | Status: DC
  Administered 2015-01-20 – 2015-01-22 (×3): 8 mg via ORAL
  Filled 2015-01-19 (×4): qty 4

## 2015-01-19 MED ORDER — DOCUSATE SODIUM 100 MG PO CAPS
100.0000 mg | ORAL_CAPSULE | Freq: Two times a day (BID) | ORAL | Status: DC
  Administered 2015-01-19 – 2015-01-23 (×9): 100 mg via ORAL
  Filled 2015-01-19 (×9): qty 1

## 2015-01-19 MED ORDER — PANTOPRAZOLE SODIUM 40 MG IV SOLR
40.0000 mg | Freq: Two times a day (BID) | INTRAVENOUS | Status: DC
  Administered 2015-01-19 – 2015-01-23 (×10): 40 mg via INTRAVENOUS
  Filled 2015-01-19 (×10): qty 40

## 2015-01-19 MED ORDER — CHLORTHALIDONE 25 MG PO TABS
25.0000 mg | ORAL_TABLET | Freq: Every day | ORAL | Status: DC
  Administered 2015-01-20 – 2015-01-23 (×3): 25 mg via ORAL
  Filled 2015-01-19 (×4): qty 1

## 2015-01-19 MED ORDER — CYCLOBENZAPRINE HCL 10 MG PO TABS
10.0000 mg | ORAL_TABLET | Freq: Every day | ORAL | Status: DC
  Administered 2015-01-20 – 2015-01-23 (×3): 10 mg via ORAL
  Filled 2015-01-19 (×3): qty 1

## 2015-01-19 MED ORDER — ONDANSETRON HCL 4 MG PO TABS
4.0000 mg | ORAL_TABLET | Freq: Four times a day (QID) | ORAL | Status: DC | PRN
Start: 2015-01-19 — End: 2015-01-23

## 2015-01-19 MED ORDER — FUROSEMIDE 10 MG/ML IJ SOLN
20.0000 mg | Freq: Once | INTRAMUSCULAR | Status: AC
  Administered 2015-01-19: 20 mg via INTRAVENOUS
  Filled 2015-01-19: qty 2

## 2015-01-19 MED ORDER — ONDANSETRON HCL 4 MG/2ML IJ SOLN
4.0000 mg | Freq: Four times a day (QID) | INTRAMUSCULAR | Status: DC | PRN
  Administered 2015-01-19: 4 mg via INTRAVENOUS
  Filled 2015-01-19: qty 2

## 2015-01-19 MED ORDER — LIDOCAINE VISCOUS 2 % MT SOLN
15.0000 mL | OROMUCOSAL | Status: DC | PRN
  Filled 2015-01-19: qty 15

## 2015-01-19 MED ORDER — ATENOLOL 50 MG PO TABS
100.0000 mg | ORAL_TABLET | Freq: Every day | ORAL | Status: DC
  Administered 2015-01-19 – 2015-01-23 (×4): 100 mg via ORAL
  Filled 2015-01-19 (×4): qty 2

## 2015-01-19 MED ORDER — LIDOCAINE VISCOUS 2 % MT SOLN
15.0000 mL | OROMUCOSAL | Status: DC | PRN
  Administered 2015-01-19: 20:00:00 15 mL via OROMUCOSAL
  Filled 2015-01-19 (×2): qty 15

## 2015-01-19 MED ORDER — ALLOPURINOL 100 MG PO TABS
300.0000 mg | ORAL_TABLET | Freq: Every day | ORAL | Status: DC
  Administered 2015-01-19 – 2015-01-23 (×4): 300 mg via ORAL
  Filled 2015-01-19 (×4): qty 3

## 2015-01-19 MED ORDER — CLONIDINE HCL 0.1 MG PO TABS
0.1000 mg | ORAL_TABLET | Freq: Every day | ORAL | Status: DC
  Administered 2015-01-19 – 2015-01-23 (×4): 0.1 mg via ORAL
  Filled 2015-01-19 (×4): qty 1

## 2015-01-19 MED ORDER — LISINOPRIL 20 MG PO TABS
40.0000 mg | ORAL_TABLET | Freq: Every day | ORAL | Status: DC
  Administered 2015-01-19 – 2015-01-23 (×4): 40 mg via ORAL
  Filled 2015-01-19 (×4): qty 2

## 2015-01-19 MED ORDER — ALLOPURINOL 100 MG PO TABS
100.0000 mg | ORAL_TABLET | Freq: Every day | ORAL | Status: DC
  Administered 2015-01-19 – 2015-01-23 (×4): 100 mg via ORAL
  Filled 2015-01-19 (×5): qty 1

## 2015-01-19 MED ORDER — EFAVIRENZ-EMTRICITAB-TENOFOVIR 600-200-300 MG PO TABS
1.0000 | ORAL_TABLET | Freq: Every day | ORAL | Status: DC
  Administered 2015-01-19: 23:00:00 1 via ORAL
  Filled 2015-01-19 (×2): qty 1

## 2015-01-19 MED ORDER — SODIUM CHLORIDE 0.9 % IV BOLUS (SEPSIS)
1000.0000 mL | Freq: Once | INTRAVENOUS | Status: AC
  Administered 2015-01-19: 1000 mL via INTRAVENOUS

## 2015-01-19 MED ORDER — PANTOPRAZOLE SODIUM 40 MG IV SOLR
40.0000 mg | Freq: Once | INTRAVENOUS | Status: AC
  Administered 2015-01-19: 40 mg via INTRAVENOUS
  Filled 2015-01-19: qty 40

## 2015-01-19 MED ORDER — SODIUM CHLORIDE 0.9 % IV SOLN
Freq: Once | INTRAVENOUS | Status: AC
  Administered 2015-01-19: 17:00:00 via INTRAVENOUS

## 2015-01-19 MED ORDER — SODIUM CHLORIDE 0.9 % IV SOLN
INTRAVENOUS | Status: DC
  Administered 2015-01-19 – 2015-01-23 (×9): via INTRAVENOUS

## 2015-01-19 MED ORDER — SODIUM CHLORIDE 0.9 % IJ SOLN
3.0000 mL | Freq: Two times a day (BID) | INTRAMUSCULAR | Status: DC
  Administered 2015-01-19 – 2015-01-22 (×4): 3 mL via INTRAVENOUS

## 2015-01-19 MED ORDER — DIAZEPAM 5 MG PO TABS
5.0000 mg | ORAL_TABLET | Freq: Every evening | ORAL | Status: DC | PRN
  Administered 2015-01-21: 5 mg via ORAL
  Filled 2015-01-19: qty 1

## 2015-01-19 MED ORDER — HYDRALAZINE HCL 50 MG PO TABS
50.0000 mg | ORAL_TABLET | Freq: Three times a day (TID) | ORAL | Status: DC
  Administered 2015-01-19 – 2015-01-23 (×10): 50 mg via ORAL
  Filled 2015-01-19 (×11): qty 1

## 2015-01-19 MED ORDER — NYSTATIN 100000 UNIT/ML MT SUSP
5.0000 mL | Freq: Four times a day (QID) | OROMUCOSAL | Status: DC
  Administered 2015-01-19 – 2015-01-21 (×7): 5 mL via ORAL
  Administered 2015-01-22 – 2015-01-23 (×4): 500000 [IU] via ORAL
  Filled 2015-01-19 (×20): qty 5

## 2015-01-19 MED ORDER — SERTRALINE HCL 50 MG PO TABS
50.0000 mg | ORAL_TABLET | Freq: Every day | ORAL | Status: DC
  Administered 2015-01-19 – 2015-01-23 (×4): 50 mg via ORAL
  Filled 2015-01-19 (×4): qty 1

## 2015-01-19 MED ORDER — FOLIC ACID 1 MG PO TABS
1.0000 mg | ORAL_TABLET | Freq: Every day | ORAL | Status: DC
  Administered 2015-01-19 – 2015-01-23 (×4): 1 mg via ORAL
  Filled 2015-01-19 (×4): qty 1

## 2015-01-19 NOTE — BH Assessment (Signed)
Assessment Note  Jared [facility] is an 64 y.o. male, initially presenting to the ED for chest pains.  Upon further assessment, pt admitted to eating paper on a regular basis (pica).  Pt admits to eating 4-5 reams of paper and eats all day long.  He reports he first started eating paper 4 years ago after his back injury and when he started on the pain medications (oxycodone).  Pt states he has been on pain medications for 4 years.  Pt reports he eats the paper as a way to relax and reduce anxiety, despite being on medications prescribed by Dr. Roque Lias.    He states that he has had past issues with depression but reports he's "happy now".  Pt denies any SI/HI.  Pt also denies drug/alcohol use.  Discussed with patient the benefits cognitive behavior therapy.  Pt agreed to follow up with a therapist.  Axis I: Anxiety Disorder NOS Axis II: Deferred Axis III:  Past Medical History  Diagnosis Date  . Gout   . Hypertension   . Heart murmur   . Aortic stenosis, mild     mild AS by echo, 08/2011  . Chronic kidney disease     kidney stones  . HIV positive 09/25/11  . Hepatitis C 09/25/11  . Full dentures     upper and lower   Axis IV: problems with primary support group Axis V: 61-70 mild symptoms  Past Medical History:  Past Medical History  Diagnosis Date  . Gout   . Hypertension   . Heart murmur   . Aortic stenosis, mild     mild AS by echo, 08/2011  . Chronic kidney disease     kidney stones  . HIV positive 09/25/11  . Hepatitis C 09/25/11  . Full dentures     upper and lower    Past Surgical History  Procedure Laterality Date  . Cholecystectomy    . Closed reduction pelvic fracture    . Cardiovascular stress test      at [facility]  . Knee arthroscopy  2009    Right  . Nephrostomy      tube Left  . Lumbar laminectomy/decompression microdiscectomy  09/25/2011    Procedure: LUMBAR LAMINECTOMY/DECOMPRESSION MICRODISCECTOMY;  Surgeon: Erline Levine, MD;  Location: [place] [facility];   Service: Neurosurgery;  Laterality: Left;  Left Lumbar Four-Five Microdiskectomy  . Shoulder arthroscopy with open rotator cuff repair Right 10/12/2014    Procedure: SHOULDER ARTHROSCOP with decompression;  Surgeon: Leanor Kail, MD;  Location: [facility];  Service: Orthopedics;  Laterality: Right;    Family History:  Family History  Problem Relation Age of Onset  . Anesthesia problems Neg Hx     Social History:  reports that he has quit smoking. He quit smokeless tobacco use about 42 years ago. He reports that he does not drink alcohol or use illicit drugs.  Additional Social History:  Alcohol / Drug Use History of alcohol / drug use?: No history of alcohol / drug abuse  CIWA: CIWA-Ar BP: (!) 144/92 mmHg Pulse Rate: 79 COWS:    Allergies:  Allergies  Allergen Reactions  . Buchu-Cornsilk-Ch Grass-Hydran Other (See Comments)    Gout.  . Colchicine Diarrhea    "Have diarrhea when taken for long periods of time"  . Nifedipine Other (See Comments)    "Unknown"  . Oxycodone-Acetaminophen Other (See Comments)    GI upset.  . Tramadol Other (See Comments)    "unknown"    Home Medications:  (  Not in a hospital admission)  OB/GYN Status:  No LMP for male patient.  General Assessment Data Location of Assessment: [facility] ED TTS Assessment: In system Is this a Tele or Face-to-Face Assessment?: Face-to-Face Is this an Initial Assessment or a Re-assessment for this encounter?: Initial Assessment Marital status: Married Panacea name: N/A Is patient pregnant?: No Pregnancy Status: No Living Arrangements: Spouse/significant other Can pt return to current living arrangement?: Yes Admission Status: Voluntary Is patient capable of signing voluntary admission?: Yes Referral Source: Self/Family/Friend Insurance type: Medicare/BCBS     Crisis Care Plan Living Arrangements: Spouse/significant other Name of Psychiatrist: None Name of Therapist: None  Education Status Is  patient currently in school?: No Current Grade: N/A Highest grade of school patient has completed: 12  Risk to self with the past 6 months Suicidal Ideation: No Has patient been a risk to self within the past 6 months prior to admission? : No Suicidal Intent: No Has patient had any suicidal intent within the past 6 months prior to admission? : No Is patient at risk for suicide?: No Suicidal Plan?: No Has patient had any suicidal plan within the past 6 months prior to admission? : No Access to Means: No What has been your use of drugs/alcohol within the last 12 months?: None reported Previous Attempts/Gestures: No How many times?: 0 Other Self Harm Risks: None Triggers for Past Attempts: None known Intentional Self Injurious Behavior: None (Pt eats paper (pica) which is causing medical complications) Family Suicide History: No Recent stressful life event(s): Other (Comment) (None reported) Persecutory voices/beliefs?: No Depression: No Depression Symptoms: Loss of interest in usual pleasures (Pt denies current depressive symptoms) Substance abuse history and/or treatment for substance abuse?: No Suicide prevention information given to non-admitted patients: Not applicable  Risk to Others within the past 6 months Homicidal Ideation: No Does patient have any lifetime risk of violence toward others beyond the six months prior to admission? : No Thoughts of Harm to Others: No Current Homicidal Intent: No Current Homicidal Plan: No Access to Homicidal Means: No Identified Victim: N/A History of harm to others?: No Assessment of Violence: On admission Violent Behavior Description: N/A Does patient have access to weapons?: No Criminal Charges Pending?: No Does patient have a court date: No Is patient on probation?: No  Psychosis Hallucinations: None noted Delusions: None noted  Mental Status Report Appearance/Hygiene: In hospital gown Eye Contact: Good Motor Activity:  Freedom of movement Speech: Soft, Logical/coherent Level of Consciousness: Alert Mood: Suspicious, Guilty, Pleasant Affect: Apprehensive, Appropriate to circumstance Anxiety Level: Minimal Thought Processes: Coherent Judgement: Partial Orientation: Person, Place, Time, Situation Obsessive Compulsive Thoughts/Behaviors: Minimal (PICA)  Cognitive Functioning Concentration: Normal Memory: Recent Intact IQ: Average Insight: Fair Impulse Control: Fair Appetite: Good Weight Loss: 0 Weight Gain: 0 Sleep: No Change Total Hours of Sleep: 8 Vegetative Symptoms: None  ADLScreening [facility] Assessment Services) Patient's cognitive ability adequate to safely complete daily activities?: Yes Patient able to express need for assistance with ADLs?: Yes Independently performs ADLs?: Yes (appropriate for developmental age)  Prior Inpatient Therapy Prior Inpatient Therapy: No  Prior Outpatient Therapy Prior Outpatient Therapy: No  ADL Screening (condition at time of admission) Patient's cognitive ability adequate to safely complete daily activities?: Yes Patient able to express need for assistance with ADLs?: Yes Independently performs ADLs?: Yes (appropriate for developmental age)       Abuse/Neglect Assessment (Assessment to be complete while patient is alone) Physical Abuse: Denies Verbal Abuse: Denies Sexual Abuse: Denies Exploitation of patient/patient's resources:  Denies Self-Neglect: Denies Values / Beliefs Cultural Requests During Hospitalization: None Spiritual Requests During Hospitalization: None Consults Spiritual Care Consult Needed: No Social Work Consult Needed: No Regulatory affairs officer (For Healthcare) Does patient have an advance directive?: No    Additional Information 1:1 In Past 12 Months?: No CIRT Risk: No Elopement Risk: No Does patient have medical clearance?: No     Disposition:  Disposition Initial Assessment Completed for this Encounter:  Yes Disposition of Patient: Other dispositions Other disposition(s): Other (Comment) (Psych MD Consult; referred to outpatient therapy)  On Site Evaluation by:   Reviewed with Physician:    [place] C Wood Novacek 01/19/2015 1:42 AM

## 2015-01-19 NOTE — Consult Note (Signed)
Pt with dysphagia and anemia.  Plan EGD Monday morning.  See Dawson Bills consult note.

## 2015-01-19 NOTE — Plan of Care (Addendum)
Problem: Discharge Progression Outcomes Goal: Other Discharge Outcomes/Goals Outcome: Progressing 1. Pt like to be called Jared [facility] 2. Pt lives at home with wife 3. Hx of HIV positive, HTN, CKD stage III, hep C, depression, gout, anemia, and pica controlled by home meds.  Plan of care progress to goal for: 1. Pain-pt c/o pain, prn meds given with improvement 2. Hemodynamically-             -VSS, pt remains afebrile this shift             -IV fluids continue per orders 3. Complications-no evidence of this shift 4. Diet-pt is NPO 5. Activity-pt is up to the BR with assistance

## 2015-01-19 NOTE — ED Notes (Signed)
TTS at bedside. 

## 2015-01-19 NOTE — Consult Note (Signed)
Consultation  Referring Provider: Dr. Reece Levy  Primary Care Physician:  Rozanna Box, MD Consulting  Gastroenterologist:      Dr. Gaylyn Cheers   Reason for Consultation: GI bleed with IDA and heme positive stool         HPI:   Jared [facility] is a 64 y.o. male with a known history of HIV positive, hypertension, CK D stage III, hepatitis C, depression, worsen ing IDA, kidney mass, pica came to the ER for pain in mid chest yesterday when swallowing diet pepsi associated with diaphoresis. This pain lasted hours and returned when he tried to eat food. He has noticed food hanging in the upper sternum and developed reflux symptoms one year ago. Dysphagia is worsening.  He was place on Prilosec a few months ago and he takes it 2-3 times per week which helped the reflux. He attribute this problem to eating paper.   He started cutting strip of computer paper into thin strips and eating <10 sheets per day. He chews a wad of it for hours and adds liquid and eventually swallows it. He says it relaxes him, but he notes it cuts the sides of his mouth and may be cutting his esophagus and  irritating his stomach. He  is ready stop. He spoke with a psychiatrist this am. No history of Doxy use, NSAIDs, candidiasis reports HIV has been undetectable  for years. No hx of EGD.   He has hard bowel movements and has used lubrication and digital disimpaction over the last week. He sees BRBPR. Constipation is a newer symptoms and may correlate with start  iron last week by his renal doctor at [facility]. He had a normal colonoscopy >10 years ago and another one has been recommended for worsening anemia.   Past Medical History  Diagnosis Date  . Gout   . Hypertension   . Heart murmur   . Aortic stenosis, mild     mild AS by echo, 08/2011  . Chronic kidney disease     kidney stones  . HIV positive 09/25/11  . Hepatitis C 09/25/11  . Full dentures     upper and lower  . Hep C w/o coma, chronic 01/19/2015  . Anemia    . Depression     Past Surgical History  Procedure Laterality Date  . Cholecystectomy    . Closed reduction pelvic fracture    . Cardiovascular stress test      at [facility]  . Knee arthroscopy  2009    Right  . Nephrostomy      tube Left  . Lumbar laminectomy/decompression microdiscectomy  09/25/2011    Procedure: LUMBAR LAMINECTOMY/DECOMPRESSION MICRODISCECTOMY;  Surgeon: Erline Levine, MD;  Location: [place] [facility];  Service: Neurosurgery;  Laterality: Left;  Left Lumbar Four-Five Microdiskectomy  . Shoulder arthroscopy with open rotator cuff repair Right 10/12/2014    Procedure: SHOULDER ARTHROSCOP with decompression;  Surgeon: Leanor Kail, MD;  Location: [place];  Service: Orthopedics;  Laterality: Right;  . Fracture surgery    . Back surgery      Family History  Problem Relation Age of Onset  . Anesthesia problems Neg Hx   . Hypertension Mother   . Cancer Father   . Heart disease Sister   . Diabetes Brother      Social History  Substance Use Topics  . Smoking status: Former Research scientist (life sciences)  . Smokeless tobacco: Former Systems developer    Quit date: 03/13/1972  . Alcohol Use: No    Prior  to Admission medications   Medication Sig Start Date End Date Taking? Authorizing Provider  allopurinol (ZYLOPRIM) 100 MG tablet Take 100 mg by mouth daily. Take 2 along with 300 mg tab.  Total 500 mg.  PM   Yes Historical Provider, MD  allopurinol (ZYLOPRIM) 300 MG tablet Take 300 mg by mouth daily. Take with (2) 100 mg tabs. Total 500 mg.  PM   Yes Historical Provider, MD  atenolol (TENORMIN) 100 MG tablet Take 100 mg by mouth daily. PM   Yes Historical Provider, MD  chlorthalidone (HYGROTON) 25 MG tablet Take 25 mg by mouth daily. 1/2 tab every morning   Yes Historical Provider, MD  cloNIDine (CATAPRES) 0.1 MG tablet Take 0.1 mg by mouth daily. 01/08/15 01/08/16 Yes Historical Provider, MD  colchicine 0.6 MG tablet Take 0.6 mg by mouth as needed.   Yes Historical Provider, MD  cyclobenzaprine  (FLEXERIL) 10 MG tablet Take 10 mg by mouth daily.   Yes Historical Provider, MD  diazepam (VALIUM) 5 MG tablet Take 5 mg by mouth at bedtime as needed. For sleep   Yes Historical Provider, MD  docusate sodium (COLACE) 100 MG capsule Take 100 mg by mouth 2 (two) times daily. 05/24/14  Yes Historical Provider, MD  doxazosin (CARDURA) 4 MG tablet Take 8 mg by mouth at bedtime.   Yes Historical Provider, MD  efavirenz-emtrictabine-tenofovir (ATRIPLA) 600-200-300 MG per tablet Take 1 tablet by mouth at bedtime.   Yes Historical Provider, MD  esomeprazole (NEXIUM) 40 MG capsule Take 40 mg by mouth daily at 12 noon. AM   Yes Historical Provider, MD  ferrous sulfate 325 (65 FE) MG tablet Take 325 mg by mouth 2 (two) times daily. 01/10/15 01/10/16 Yes Historical Provider, MD  folic acid (FOLVITE) 1 MG tablet Take 1 mg by mouth daily. PM   Yes Historical Provider, MD  hydrALAZINE (APRESOLINE) 50 MG tablet Take 50 mg by mouth 3 (three) times daily.   Yes Historical Provider, MD  HYDROcodone-acetaminophen (NORCO/VICODIN) 5-325 MG per tablet Take 1 tablet by mouth every 4 (four) hours as needed for moderate pain.   Yes Historical Provider, MD  lidocaine (XYLOCAINE) 5 % ointment Apply 1 application topically daily as needed. 11/26/14  Yes Historical Provider, MD  lisinopril (PRINIVIL,ZESTRIL) 40 MG tablet Take 40 mg by mouth daily. PM   Yes Historical Provider, MD  naproxen (NAPROSYN) 500 MG tablet Take 1 tablet by mouth 2 (two) times daily. 11/02/14  Yes Historical Provider, MD  naproxen sodium (ANAPROX) 220 MG tablet Take 220 mg by mouth 2 (two) times daily with a meal.   Yes Historical Provider, MD  sertraline (ZOLOFT) 50 MG tablet Take 50 mg by mouth daily. PM   Yes Historical Provider, MD  HYDROcodone-acetaminophen (NORCO) 5-325 MG per tablet Take 1-2 tablets by mouth every 6 (six) hours as needed for moderate pain. MAXIMUM TOTAL ACETAMINOPHEN DOSE IS 4000 MG PER DAY 10/12/14   Leanor Kail, MD   HYDROcodone-acetaminophen (VICODIN) 5-500 MG per tablet Take 1 tablet by mouth 2 (two) times daily as needed. For pain    Historical Provider, MD  oxyCODONE (OXY IR/ROXICODONE) 5 MG immediate release tablet Take 5 mg by mouth every 4 (four) hours. 07/30/14   Historical Provider, MD  oxyCODONE-acetaminophen (PERCOCET) 5-325 MG per tablet Take 1 tablet by mouth 2 (two) times daily.    Historical Provider, MD    Current Facility-Administered Medications  Medication Dose Route Frequency Provider Last Rate Last Dose  . 0.9 %  sodium chloride infusion   Intravenous Continuous Juluis Mire, MD 75 mL/hr at 01/19/15 0357    . allopurinol (ZYLOPRIM) tablet 100 mg  100 mg Oral Daily Juluis Mire, MD      . allopurinol (ZYLOPRIM) tablet 300 mg  300 mg Oral Daily Juluis Mire, MD   300 mg at 01/19/15 0848  . atenolol (TENORMIN) tablet 100 mg  100 mg Oral Daily Juluis Mire, MD   100 mg at 01/19/15 0847  . chlorthalidone (HYGROTON) tablet 25 mg  25 mg Oral Daily Juluis Mire, MD   25 mg at 01/19/15 [id number]  . cloNIDine (CATAPRES) tablet 0.1 mg  0.1 mg Oral Daily Juluis Mire, MD   0.1 mg at 01/19/15 0848  . cyclobenzaprine (FLEXERIL) tablet 10 mg  10 mg Oral Daily Juluis Mire, MD   10 mg at 01/19/15 0854  . diazepam (VALIUM) tablet 5 mg  5 mg Oral QHS PRN Juluis Mire, MD      . docusate sodium (COLACE) capsule 100 mg  100 mg Oral BID Juluis Mire, MD   100 mg at 01/19/15 0847  . doxazosin (CARDURA) tablet 8 mg  8 mg Oral QHS Juluis Mire, MD      . efavirenz-emtricitabine-tenofovir (ATRIPLA) 600-200-300 MG per tablet 1 tablet  1 tablet Oral QHS Juluis Mire, MD      . ferrous sulfate tablet 325 mg  325 mg Oral BID Juluis Mire, MD   325 mg at 01/19/15 0848  . folic acid (FOLVITE) tablet 1 mg  1 mg Oral Daily Juluis Mire, MD   1 mg at 01/19/15 0848  . hydrALAZINE (APRESOLINE) tablet 50 mg  50 mg Oral TID Juluis Mire, MD   50 mg at 01/19/15 0848  .  lisinopril (PRINIVIL,ZESTRIL) tablet 40 mg  40 mg Oral Daily Juluis Mire, MD   40 mg at 01/19/15 0847  . ondansetron (ZOFRAN) tablet 4 mg  4 mg Oral Q6H PRN Juluis Mire, MD       Or  . ondansetron [facility]) injection 4 mg  4 mg Intravenous Q6H PRN Juluis Mire, MD   4 mg at 01/19/15 0851  . oxyCODONE-acetaminophen (PERCOCET/ROXICET) 5-325 MG per tablet 1 tablet  1 tablet Oral BID Juluis Mire, MD   1 tablet at 01/19/15 0848  . pantoprazole (PROTONIX) injection 40 mg  40 mg Intravenous Q12H Juluis Mire, MD   40 mg at 01/19/15 0850  . sertraline (ZOLOFT) tablet 50 mg  50 mg Oral Daily Juluis Mire, MD   50 mg at 01/19/15 0848  . sodium chloride 0.9 % injection 3 mL  3 mL Intravenous Q12H Juluis Mire, MD   3 mL at 01/19/15 0358    Allergies as of 01/18/2015 - Review Complete 01/18/2015  Allergen Reaction Noted  . Buchu-cornsilk-ch grass-hydran Other (See Comments) 01/18/2015  . Colchicine Diarrhea 01/18/2015  . Nifedipine Other (See Comments) 01/18/2015  . Oxycodone-acetaminophen Other (See Comments) 01/18/2015  . Tramadol Other (See Comments) 01/18/2015     Review of Systems:    A 12 system review was obtained and all negative except where noted in HPI. Hx of 1/4 ppd tobacco. Hx of Heroin IV use stopped in 1983. Hx if HIV for 17 years undetectable on antiviral. No treatment for HCV-but he is willing to take the newer drugs. He is [facility] at [facility] for HIV and nephrology. He is not followed  by hepatology clinic. He reports no liver problems.     Physical Exam:  Vital signs in last 24 hours: Temp:  [98.1 F (36.7 C)-100.5 F (38.1 C)] 98.4 F (36.9 C) (08/20 0846) Pulse Rate:  [60-100] 71 (08/20 0846) Resp:  [10-22] 18 (08/20 0342) BP: (122-164)/(60-98) 122/83 mmHg (08/20 0846) SpO2:  [97 %-100 %] 99 % (08/20 0846) Weight:  [75.796 kg (167 lb 1.6 oz)-77.111 kg (170 lb)] 75.796 kg (167 lb 1.6 oz) (08/20 0342) Last BM Date: 01/18/15  General:   Well-developed, well-nourished and in no acute distress Head:  Head without obvious abnormality, atraumatic  Eyes:   Conjunctiva pink, sclera anicteric   ENT:   Mouth free of lesions, mucosa moist, tongue pink, no thrush noted, Edentulous, cheilitis  Neck:   Supple w/o thyromegaly or mass, trachea midline, no adenopathy  Lungs: Clear to auscultation bilaterally, respirations unlabored Heart:     Normal S1S2, no rubs, murmurs, gallops. Abdomen: Soft, non-tender, no hepatosplenomegaly, hernia, or mass and BS normal Rectal: Deferred Lymph:  No cervical or supraclavicular adenopathy. Extremities:   No edema, cyanosis, or clubbing Skin  Skin color, texture, turgor normal, no rashes or lesions Neuro:  A&O x 3. CNII-XII intact, normal strength Psych:  Appropriate mood and affect.  Data Reviewed:  LAB RESULTS:  Recent Labs  01/19/15 0342 01/19/15 0739  WBC 6.2 6.0  HGB 7.9* 7.3*  HCT 25.1* 23.3*  PLT 200 192   BMET  Recent Labs  01/18/15 2209 01/19/15 0342  NA 141 141  K 3.5 3.7  CL 109 112*  CO2 26 25  GLUCOSE 103* 104*  BUN 24* 20  CREATININE 2.20* 2.00*  CALCIUM 9.0 8.5*   LFT  Recent Labs  01/19/15 0021  PROT 7.2  ALBUMIN 3.7  AST 51*  ALT 42  ALKPHOS 82  BILITOT 0.1*  BILIDIR <0.1*  IBILI NOT CALCULATED   PT/INR No results for input(s): LABPROT, INR in the last 72 hours.  STUDIES: Dg Chest 2 View  01/18/2015   CLINICAL DATA:  Chest pain  EXAM: CHEST  2 VIEW  COMPARISON:  11/12/2011  FINDINGS: Normal heart size and mediastinal contours. No acute infiltrate or edema. No effusion or pneumothorax. No acute osseous findings. Cholecystectomy clips.  IMPRESSION: No active cardiopulmonary disease.   Electronically Signed   By: Monte Fantasia M.D.   On: 01/18/2015 22:23    Assessment:  CLARION TRASK is a 64 y.o. reports at least  one year history  of reflux, progressive dysphagia, odynophagia and take Prilosec a few times per week which helps the reflux.  This may be esophagitis/ulcer/malignancy/candidasis. He has CRD stage 3, worsening renal fun with discontinuation in Chorthalidone by his [facility] nephrologist,worsening anemia and hard stool with BRBPR> Hemoccult positive.Consider vitamin deficiency as cause for cheilitis. Pica noted and Psych consult performed.   Plan:  EGD with possible dilatation on Monday.  Obtain iron labs,B12/folate. Empiric Nystatin  This case was discussed with Dr. Manya Silvas in collaboration of care. Thank you for the consultation.  These services provided by Denice Paradise RN, MSN, ANP-BC under collaborative practice agreement with Manya Silvas, MD.   01/19/2015, 10:55 AM

## 2015-01-19 NOTE — Consult Note (Signed)
[facility] Face-to-Face Psychiatry Consult   Reason for Consult:  Pt has been eating strips of typing paper for over a year.  Referring Physician:  Paulette Blanch, MD  Patient Identification: Jared Walker MRN:  [id number] Principal Diagnosis: Chest pain   Diagnosis:   Patient Active Problem List   Diagnosis Date Noted  . Chest pain [R07.9] 01/19/2015  . Anemia [D64.9] 01/19/2015  . GI bleed [K92.2] 01/19/2015  . Pica in adults [F50.8] 01/19/2015  . CKD (chronic kidney disease), stage III [N18.3] 01/19/2015  . HIV (human immunodeficiency virus infection) [Z21] 01/19/2015  . HTN (hypertension) [I10] 01/19/2015  . Hep C w/o coma, chronic [B18.2] 01/19/2015  . CKD (chronic kidney disease) [N18.9] 01/19/2015    Total Time spent with patient: 1 hour  Subjective:   Jared Walker is a 64 y.o. male patient admitted with chest pain after drinking a swallow of diet Pepsi. He has a long medical history which is listed below. Of noted, the pt is anemic with H&H of 7.9/25.1. Of note his last H&H on 01/08/2015 was 8.5/27.5.   Per the pt's wife, the pt has been tearing off strips of typing paper and eating them for the past year.  When evaluated by Dr. [name] Walker, the pt denies SI.   On interview, the pt's wife was present. Jared Walker shared he has been eating paper for at least 2 years. He believes it is less < 10 sheets per day. He notes it was a habit he developed and has been embarrassed when discussing it. He shared it helps to relax him and when he chews on the paper. The pt denies he is feeling depressed but has been taking an unknown dosage of sertraline po QHS for several months. Although he denies feeling depressed, the pt shared his sleep is decreased (due to back pain), decreased energy, decreased appetite and psychomotor retardation. The pt denies anhedonia and issues with attention and concentration. The pt denies feeing hopeless, helpless, worthless and anxious. The pt denies SI, HI and AVH.    The pt's wife notes the pt is very private and does not want to discuss issues with many people including his family. The pt and his wife quarrel about some issues while this provider is present. They have never participated in individual/couples psychotherapy which may be off benefit to each of them. Protective factors against suicide include: religion, family and being future oriented.   HPI Elements:   Location:  see HPI. Quality:  moderate. Severity:  stable. Timing:  noted with presentation to the ED. Duration:  for over a year. Context:  anemia.  Past Psychiatric History: Psychiatrist: Denies Therapist: Denies Hospitalizations: Denies ECT: Denies Suicide attempt/Self-harm: Denies Homicide attempts/harming others: Denies  Past Medical History:  Past Medical History  Diagnosis Date  . Gout   . Hypertension   . Heart murmur   . Aortic stenosis, mild     mild AS by echo, 08/2011  . Chronic kidney disease     kidney stones  . HIV positive 09/25/11  . Hepatitis C 09/25/11  . Full dentures     upper and lower  . Hep C w/o coma, chronic 01/19/2015  . Anemia   . Depression     Past Surgical History  Procedure Laterality Date  . Cholecystectomy    . Closed reduction pelvic fracture    . Cardiovascular stress test      at [facility]  . Knee arthroscopy  2009    Right  .  Nephrostomy      tube Left  . Lumbar laminectomy/decompression microdiscectomy  09/25/2011    Procedure: LUMBAR LAMINECTOMY/DECOMPRESSION MICRODISCECTOMY;  Surgeon: Erline Levine, MD;  Location: [place] [facility];  Service: Neurosurgery;  Laterality: Left;  Left Lumbar Four-Five Microdiskectomy  . Shoulder arthroscopy with open rotator cuff repair Right 10/12/2014    Procedure: SHOULDER ARTHROSCOP with decompression;  Surgeon: Leanor Kail, MD;  Location: [place];  Service: Orthopedics;  Laterality: Right;  . Fracture surgery    . Back surgery     Family History:  Family History  Problem Relation  Age of Onset  . Anesthesia problems Neg Hx   . Hypertension Mother   . Cancer Father   . Heart disease Sister   . Diabetes Brother    Social History:  History  Alcohol Use No     History  Drug Use No    Social History   Social History  . Marital Status: Married    Spouse Name: N/A  . Number of Children: N/A  . Years of Education: N/A   Social History Main Topics  . Smoking status: Former Research scientist (life sciences)  . Smokeless tobacco: Former Systems developer    Quit date: 03/13/1972  . Alcohol Use: No  . Drug Use: No  . Sexual Activity: Not Asked   Other Topics Concern  . None   Social History Narrative   Additional Social History:    History of alcohol / drug use?: No history of alcohol / drug abuse   Pt has used heroin in the past but has not done so in decades.   Allergies:   Allergies  Allergen Reactions  . Buchu-Cornsilk-Ch Grass-Hydran Other (See Comments)    Gout.  . Colchicine Diarrhea    "Have diarrhea when taken for long periods of time"  . Nifedipine Other (See Comments)    "Unknown"  . Oxycodone-Acetaminophen Other (See Comments)    GI upset.  . Tramadol Other (See Comments)    "unknown"    Labs:  Results for orders placed or performed during the hospital encounter of 01/18/15 (from the past 48 hour(s))  CBC     Status: Abnormal   Collection Time: 01/18/15 10:09 PM  Result Value Ref Range   WBC 8.7 3.8 - 10.6 K/uL   RBC 3.07 (L) 4.40 - 5.90 MIL/uL   Hemoglobin 7.9 (L) 13.0 - 18.0 g/dL   HCT 25.1 (L) 40.0 - 52.0 %   MCV 81.8 80.0 - 100.0 fL   MCH 25.8 (L) 26.0 - 34.0 pg   MCHC 31.5 (L) 32.0 - 36.0 g/dL   RDW 17.6 (H) 11.5 - 14.5 %   Platelets 243 150 - 440 K/uL  Troponin I     Status: None   Collection Time: 01/18/15 10:09 PM  Result Value Ref Range   Troponin I <0.03 <0.031 ng/mL    Comment:        NO INDICATION OF MYOCARDIAL INJURY.   Basic metabolic panel     Status: Abnormal   Collection Time: 01/18/15 10:09 PM  Result Value Ref Range   Sodium 141 135  - 145 mmol/L   Potassium 3.5 3.5 - 5.1 mmol/L   Chloride 109 101 - 111 mmol/L   CO2 26 22 - 32 mmol/L   Glucose, Bld 103 (H) 65 - 99 mg/dL   BUN 24 (H) 6 - 20 mg/dL   Creatinine, Ser 2.20 (H) 0.61 - 1.24 mg/dL   Calcium 9.0 8.9 - 10.3 mg/dL   GFR  calc non Af Amer 30 (L) >60 mL/min   GFR calc Af Amer 35 (L) >60 mL/min    Comment: (NOTE) The eGFR has been calculated using the CKD EPI equation. This calculation has not been validated in all clinical situations. eGFR's persistently <60 mL/min signify possible Chronic Kidney Disease.    Anion gap 6 5 - 15  Hepatic function panel     Status: Abnormal   Collection Time: 01/19/15 12:21 AM  Result Value Ref Range   Total Protein 7.2 6.5 - 8.1 g/dL   Albumin 3.7 3.5 - 5.0 g/dL   AST 51 (H) 15 - 41 U/L   ALT 42 17 - 63 U/L   Alkaline Phosphatase 82 38 - 126 U/L   Total Bilirubin 0.1 (L) 0.3 - 1.2 mg/dL   Bilirubin, Direct <0.1 (L) 0.1 - 0.5 mg/dL   Indirect Bilirubin NOT CALCULATED 0.3 - 0.9 mg/dL  Lipase, blood     Status: Abnormal   Collection Time: 01/19/15 12:21 AM  Result Value Ref Range   Lipase 110 (H) 22 - 51 U/L  Ethanol     Status: None   Collection Time: 01/19/15 12:21 AM  Result Value Ref Range   Alcohol, Ethyl (B) <5 <5 mg/dL    Comment:        LOWEST DETECTABLE LIMIT FOR SERUM ALCOHOL IS 5 mg/dL FOR MEDICAL PURPOSES ONLY   Type and screen for Red Blood Exchange     Status: None   Collection Time: 01/19/15 12:21 AM  Result Value Ref Range   ABO/RH(D) B POS    Antibody Screen NEG    Sample Expiration 01/22/2015   ABO/Rh     Status: None   Collection Time: 01/19/15 12:21 AM  Result Value Ref Range   ABO/RH(D) B POS   CBC     Status: Abnormal   Collection Time: 01/19/15  3:42 AM  Result Value Ref Range   WBC 6.2 3.8 - 10.6 K/uL   RBC 3.06 (L) 4.40 - 5.90 MIL/uL   Hemoglobin 7.9 (L) 13.0 - 18.0 g/dL   HCT 25.1 (L) 40.0 - 52.0 %   MCV 81.9 80.0 - 100.0 fL   MCH 25.6 (L) 26.0 - 34.0 pg   MCHC 31.3 (L) 32.0 -  36.0 g/dL   RDW 18.2 (H) 11.5 - 14.5 %   Platelets 200 150 - 440 K/uL  Basic metabolic panel     Status: Abnormal   Collection Time: 01/19/15  3:42 AM  Result Value Ref Range   Sodium 141 135 - 145 mmol/L   Potassium 3.7 3.5 - 5.1 mmol/L   Chloride 112 (H) 101 - 111 mmol/L   CO2 25 22 - 32 mmol/L   Glucose, Bld 104 (H) 65 - 99 mg/dL   BUN 20 6 - 20 mg/dL   Creatinine, Ser 2.00 (H) 0.61 - 1.24 mg/dL   Calcium 8.5 (L) 8.9 - 10.3 mg/dL   GFR calc non Af Amer 34 (L) >60 mL/min   GFR calc Af Amer 39 (L) >60 mL/min    Comment: (NOTE) The eGFR has been calculated using the CKD EPI equation. This calculation has not been validated in all clinical situations. eGFR's persistently <60 mL/min signify possible Chronic Kidney Disease.    Anion gap 4 (L) 5 - 15  CBC     Status: Abnormal   Collection Time: 01/19/15  7:39 AM  Result Value Ref Range   WBC 6.0 3.8 - 10.6 K/uL   RBC 2.84 (  L) 4.40 - 5.90 MIL/uL   Hemoglobin 7.3 (L) 13.0 - 18.0 g/dL   HCT 23.3 (L) 40.0 - 52.0 %   MCV 82.2 80.0 - 100.0 fL   MCH 25.7 (L) 26.0 - 34.0 pg   MCHC 31.2 (L) 32.0 - 36.0 g/dL   RDW 17.8 (H) 11.5 - 14.5 %   Platelets 192 150 - 440 K/uL    Vitals: Blood pressure 122/83, pulse 71, temperature 98.4 F (36.9 C), temperature source Oral, resp. rate 18, height '5\' 11"'  (1.803 m), weight 75.796 kg (167 lb 1.6 oz), SpO2 99 %.  Risk to Self: Suicidal Ideation: No Suicidal Intent: No Is patient at risk for suicide?: No Suicidal Plan?: No Access to Means: No What has been your use of drugs/alcohol within the last 12 months?: None reported How many times?: 0 Other Self Harm Risks: None Triggers for Past Attempts: None known Intentional Self Injurious Behavior: None (Pt eats paper (pica) which is causing medical complications) Risk to Others: Homicidal Ideation: No Thoughts of Harm to Others: No Current Homicidal Intent: No Current Homicidal Plan: No Access to Homicidal Means: No Identified Victim:  N/A History of harm to others?: No Assessment of Violence: On admission Violent Behavior Description: N/A Does patient have access to weapons?: No Criminal Charges Pending?: No Does patient have a court date: No Prior Inpatient Therapy: Prior Inpatient Therapy: No Prior Outpatient Therapy: Prior Outpatient Therapy: No  Current Facility-Administered Medications  Medication Dose Route Frequency Provider Last Rate Last Dose  . 0.9 %  sodium chloride infusion   Intravenous Continuous Juluis Mire, MD 75 mL/hr at 01/19/15 0357    . allopurinol (ZYLOPRIM) tablet 100 mg  100 mg Oral Daily Juluis Mire, MD      . allopurinol (ZYLOPRIM) tablet 300 mg  300 mg Oral Daily Juluis Mire, MD   300 mg at 01/19/15 0848  . atenolol (TENORMIN) tablet 100 mg  100 mg Oral Daily Juluis Mire, MD   100 mg at 01/19/15 0847  . chlorthalidone (HYGROTON) tablet 25 mg  25 mg Oral Daily Juluis Mire, MD   25 mg at 01/19/15 0881  . cloNIDine (CATAPRES) tablet 0.1 mg  0.1 mg Oral Daily Juluis Mire, MD   0.1 mg at 01/19/15 0848  . cyclobenzaprine (FLEXERIL) tablet 10 mg  10 mg Oral Daily Juluis Mire, MD   10 mg at 01/19/15 0854  . diazepam (VALIUM) tablet 5 mg  5 mg Oral QHS PRN Juluis Mire, MD      . docusate sodium (COLACE) capsule 100 mg  100 mg Oral BID Juluis Mire, MD   100 mg at 01/19/15 0847  . doxazosin (CARDURA) tablet 8 mg  8 mg Oral QHS Juluis Mire, MD      . efavirenz-emtricitabine-tenofovir (ATRIPLA) 600-200-300 MG per tablet 1 tablet  1 tablet Oral QHS Juluis Mire, MD      . ferrous sulfate tablet 325 mg  325 mg Oral BID Juluis Mire, MD   325 mg at 01/19/15 0848  . folic acid (FOLVITE) tablet 1 mg  1 mg Oral Daily Juluis Mire, MD   1 mg at 01/19/15 0848  . hydrALAZINE (APRESOLINE) tablet 50 mg  50 mg Oral TID Juluis Mire, MD   50 mg at 01/19/15 0848  . lisinopril (PRINIVIL,ZESTRIL) tablet 40 mg  40 mg Oral Daily Juluis Mire, MD   40 mg at  01/19/15 0847  .  ondansetron (ZOFRAN) tablet 4 mg  4 mg Oral Q6H PRN Juluis Mire, MD       Or  . ondansetron [facility]) injection 4 mg  4 mg Intravenous Q6H PRN Juluis Mire, MD   4 mg at 01/19/15 0851  . oxyCODONE-acetaminophen (PERCOCET/ROXICET) 5-325 MG per tablet 1 tablet  1 tablet Oral BID Juluis Mire, MD   1 tablet at 01/19/15 0848  . pantoprazole (PROTONIX) injection 40 mg  40 mg Intravenous Q12H Juluis Mire, MD   40 mg at 01/19/15 0850  . sertraline (ZOLOFT) tablet 50 mg  50 mg Oral Daily Juluis Mire, MD   50 mg at 01/19/15 0848  . sodium chloride 0.9 % injection 3 mL  3 mL Intravenous Q12H Juluis Mire, MD   3 mL at 01/19/15 0358    Musculoskeletal: Strength & Muscle Tone: within normal limits Gait & Station: not assessed Patient leans: N/A  Psychiatric Specialty Exam: Physical Exam  ROS  Blood pressure 122/83, pulse 71, temperature 98.4 F (36.9 C), temperature source Oral, resp. rate 18, height '5\' 11"'  (1.803 m), weight 75.796 kg (167 lb 1.6 oz), SpO2 99 %.Body mass index is 23.32 kg/(m^2).  General Appearance: Casual and edentulous  Eye Contact::  Good  Speech:  Clear and Coherent and Normal Rate  Volume:  Normal  Mood:  Euthymic  Affect:  Full Range  Thought Process:  Coherent, Goal Directed, Intact, Linear and Logical  Orientation:  Full (Time, Place, and Person)  Thought Content:  Negative  Suicidal Thoughts:  No  Homicidal Thoughts:  No  Memory:  Immediate;   Good Recent;   Good Remote;   Good  Judgement:  Good  Insight:  Good  Psychomotor Activity:  Normal  Concentration:  Good  Recall:  Good  Fund of Knowledge:Good  Language: Good  Akathisia:  Negative  Handed:  Not asked  AIMS (if indicated):     Assets:  Communication Skills Desire for Improvement Financial Resources/Insurance Housing Intimacy Leisure Time Resilience Social Support Talents/Skills Transportation Vocational/Educational  ADL's:  Intact  Cognition: WNL   Sleep:  Decreased due to back pain   Medical Decision Making: Established Problem, Stable/Improving (1), New problem, with additional work up planned, Review of Psycho-Social Stressors (1), Review or order clinical lab tests (1), Established Problem, Worsening (2) and Review or order medicine tests (1)  Treatment Plan Summary: Plan Pt and his wife would benefit from marital psychotherpay/couseling. SW, please provide pt and his wife with psychotherapists in their area for marital therapy.  1. Please restart pt's home dose of sertraline. Pt's wife did not have this medication on her at the time of the interview. Pt nor his wife recall the current dosage of sertraline.  2. Please continue to look for causes of anemia which is likely causing pica. It is difficult to determine when pt's anemia started as I am not able to locate lab work from about 2 years ago. One of my DDx of anemia includes: colon cancer which should be ruled out in a 64yo male with rectal bleeding.  3. Pt does not require inpatient psychiatric admission at this time.   If you have more questions, please feel free to contact [facility] psychiatry.   Plan:  No evidence of imminent risk to self or others at present.   Patient does not meet criteria for psychiatric inpatient admission. Supportive therapy provided about ongoing stressors. Discussed crisis plan, support from social network, calling 911, coming to the  Emergency Department, and calling Suicide Hotline. Disposition: Home when medically stable.   Donita Brooks 01/19/2015 11:10 AM

## 2015-01-19 NOTE — ED Notes (Signed)
TTS recommend IVC.  IVC initiated by unit Network engineer.

## 2015-01-19 NOTE — Clinical Documentation Improvement (Signed)
Hospitalist  Can the diagnosis of blood loss anemia be further specified?   Acute Blood Loss Anemia, including the suspected or known cause or associated condition(s)  Acute on chronic blood loss anemia, including the suspected or known cause or associated condition(s)  Chronic blood loss anemia, including the suspected or known cause or associated condition(s)  Precipitous drop in Hematocrit, including the suspected or known cause or associated condition(s)  Other  Clinically Undetermined Document any associated diagnoses/conditions.  Supporting Information:(As per notes) Anemia, acute on chronic. H&H now 7.9/23.1. Most recent H&H on 01/08/2015 was 8.5/27.5. Patient hemodynamically stable.   Component     Latest Ref Rng 01/18/2015 01/19/2015 01/19/2015 01/19/2015          3:42 AM  7:39 AM 11:53 AM  WBC     3.8 - 10.6 K/uL 8.7 6.2 6.0 6.9  RBC     4.40 - 5.90 MIL/uL 3.07 (L) 3.06 (L) 2.84 (L) 2.88 (L)  Hemoglobin     13.0 - 18.0 g/dL 7.9 (L) 7.9 (L) 7.3 (L) 7.3 (L)  HCT     40.0 - 52.0 % 25.1 (L) 25.1 (L) 23.3 (L) 23.7 (L)    Please exercise your independent, professional judgment when responding. A specific answer is not anticipated or expected.  Thank You, Alessandra Grout, RN, BSN, CCDS,Clinical Documentation Specialist:  [phone]  [phone]=[facility]- Health Information Management

## 2015-01-19 NOTE — Plan of Care (Addendum)
Problem: Discharge Progression Outcomes Goal: Other Discharge Outcomes/Goals Outcome: Progressing Plan of care progress to goal: Psych consult done. GI consult done. Pt advanced to clear liquid diet, can advance to full liquid as tolerated. EGD planned to be done Monday.  HGB checked q4h. HGB 6.9 after 1530 draw, MD notified. 2 units ordered to transfuse. Pt receiving 1 unit pRBC currently. Tolerating well. Up to BR with standby assist. C/o pain in chest 1x. Patient stated relief after pain medicine given this morning.

## 2015-01-19 NOTE — ED Notes (Signed)
Per Dr. Beather Arbour, no IVC at this time, secretary notified.  Pt to be admitted medically.

## 2015-01-19 NOTE — H&P (Signed)
[place] [facility] [place] NAME: Jared Walker    MR#:  [id number]  DATE OF BIRTH:  1951-03-18  DATE OF ADMISSION:  01/18/2015  PRIMARY CARE PHYSICIAN: Rozanna Box, MD Dr. Roetta Sessions, [facility] clinic  REQUESTING/REFERRING PHYSICIAN: Dr. Beather Arbour  CHIEF COMPLAINT:   Chief Complaint  Patient presents with  . Chest Pain    HISTORY OF PRESENT ILLNESS:  Jared Walker  is a 64 y.o. male with a known history of HIV positive, hypertension, CK D stage III, hepatitis C, depression, history of pica (eating paper) presents to the emergency room with the complaints of chest pain while he was attempting to drink diet soda yesterday afternoon. Associated with nausea, mild shortness of breath with diaphoresis +. No palpitations/dizziness/loss of consciousness. No radiation of chest pain. Denies any fever, cough. No vomitings/abdominal pain/diarrhea/dysuria. Patient noticed recurrence of chest pain while attempting to eat food later in the day yesterday also. Evaluation in the ED revealed stable vital signs and workup revealed H&H of 7.9/25.1. Of note his last H&H on 01/08/2015 was 8.5/27.5. BUN/creatinine stable at 24/2.2. Troponin less than 0.03. Chest x-ray negative for acute cardiopulmonary pathology. EKG normal sinus rhythm with ventricular rate of 74 bpm, nonspecific T-wave abnormality. Rectal examination dark stool with trace guaiac positive. Patient was given GI cocktail and currently he is pain-free. Psych on call was consulted by the ED physician because of abnormal behavior with pica (eating paper) and psych consultation is pending at this time. Patient denies any suicidal ideation. Hospitalist service was consulted for further management.  PAST MEDICAL HISTORY:   Past Medical History  Diagnosis Date  . Gout   . Hypertension   . Heart murmur   . Aortic stenosis, mild     mild AS by echo, 08/2011  . Chronic kidney disease     kidney stones  . HIV  positive 09/25/11  . Hepatitis C 09/25/11  . Full dentures     upper and lower  . Hep C w/o coma, chronic 01/19/2015  . Anemia   . Depression     PAST SURGICAL HISTORY:   Past Surgical History  Procedure Laterality Date  . Cholecystectomy    . Closed reduction pelvic fracture    . Cardiovascular stress test      at [facility]  . Knee arthroscopy  2009    Right  . Nephrostomy      tube Left  . Lumbar laminectomy/decompression microdiscectomy  09/25/2011    Procedure: LUMBAR LAMINECTOMY/DECOMPRESSION MICRODISCECTOMY;  Surgeon: Erline Levine, MD;  Location: [place] [facility];  Service: Neurosurgery;  Laterality: Left;  Left Lumbar Four-Five Microdiskectomy  . Shoulder arthroscopy with open rotator cuff repair Right 10/12/2014    Procedure: SHOULDER ARTHROSCOP with decompression;  Surgeon: Leanor Kail, MD;  Location: [place];  Service: Orthopedics;  Laterality: Right;  . Fracture surgery    . Back surgery      SOCIAL HISTORY:   Social History  Substance Use Topics  . Smoking status: Former Research scientist (life sciences)  . Smokeless tobacco: Former Systems developer    Quit date: 03/13/1972  . Alcohol Use: No    FAMILY HISTORY:   Family History  Problem Relation Age of Onset  . Anesthesia problems Neg Hx   . Hypertension Mother   . Cancer Father   . Heart disease Sister   . Diabetes Brother     DRUG ALLERGIES:   Allergies  Allergen Reactions  . Buchu-Cornsilk-Ch Grass-Hydran Other (See Comments)  Gout.  . Colchicine Diarrhea    "Have diarrhea when taken for long periods of time"  . Nifedipine Other (See Comments)    "Unknown"  . Oxycodone-Acetaminophen Other (See Comments)    GI upset.  . Tramadol Other (See Comments)    "unknown"    REVIEW OF SYSTEMS:   Review of Systems  Constitutional: Negative for fever, chills and malaise/fatigue.  HENT: Negative for ear pain, hearing loss, nosebleeds, sore throat and tinnitus.   Eyes: Negative for blurred vision, double vision, pain, discharge  and redness.  Respiratory: Positive for shortness of breath. Negative for cough, hemoptysis, sputum production and wheezing.   Cardiovascular: Positive for chest pain. Negative for palpitations, orthopnea and leg swelling.  Gastrointestinal: Positive for nausea. Negative for vomiting, abdominal pain, diarrhea, constipation, blood in stool and melena.  Genitourinary: Negative for dysuria, urgency, frequency and hematuria.  Musculoskeletal: Negative for back pain, joint pain and neck pain.  Skin: Negative for itching and rash.  Neurological: Negative for dizziness, tingling, sensory change, focal weakness and seizures.  Endo/Heme/Allergies: Does not bruise/bleed easily.  Psychiatric/Behavioral: Positive for depression. The patient is not nervous/anxious.     MEDICATIONS AT HOME:   Prior to Admission medications   Medication Sig Start Date End Date Taking? Authorizing Provider  allopurinol (ZYLOPRIM) 100 MG tablet Take 100 mg by mouth daily. Take 2 along with 300 mg tab.  Total 500 mg.  PM   Yes Historical Provider, MD  allopurinol (ZYLOPRIM) 300 MG tablet Take 300 mg by mouth daily. Take with (2) 100 mg tabs. Total 500 mg.  PM   Yes Historical Provider, MD  atenolol (TENORMIN) 100 MG tablet Take 100 mg by mouth daily. PM   Yes Historical Provider, MD  chlorthalidone (HYGROTON) 25 MG tablet Take 25 mg by mouth daily. 1/2 tab every morning   Yes Historical Provider, MD  cloNIDine (CATAPRES) 0.1 MG tablet Take 0.1 mg by mouth daily. 01/08/15 01/08/16 Yes Historical Provider, MD  colchicine 0.6 MG tablet Take 0.6 mg by mouth as needed.   Yes Historical Provider, MD  cyclobenzaprine (FLEXERIL) 10 MG tablet Take 10 mg by mouth daily.   Yes Historical Provider, MD  diazepam (VALIUM) 5 MG tablet Take 5 mg by mouth at bedtime as needed. For sleep   Yes Historical Provider, MD  docusate sodium (COLACE) 100 MG capsule Take 100 mg by mouth 2 (two) times daily. 05/24/14  Yes Historical Provider, MD  doxazosin  (CARDURA) 4 MG tablet Take 8 mg by mouth at bedtime.   Yes Historical Provider, MD  efavirenz-emtrictabine-tenofovir (ATRIPLA) 600-200-300 MG per tablet Take 1 tablet by mouth at bedtime.   Yes Historical Provider, MD  esomeprazole (NEXIUM) 40 MG capsule Take 40 mg by mouth daily at 12 noon. AM   Yes Historical Provider, MD  ferrous sulfate 325 (65 FE) MG tablet Take 325 mg by mouth 2 (two) times daily. 01/10/15 01/10/16 Yes Historical Provider, MD  folic acid (FOLVITE) 1 MG tablet Take 1 mg by mouth daily. PM   Yes Historical Provider, MD  hydrALAZINE (APRESOLINE) 50 MG tablet Take 50 mg by mouth 3 (three) times daily.   Yes Historical Provider, MD  HYDROcodone-acetaminophen (NORCO/VICODIN) 5-325 MG per tablet Take 1 tablet by mouth every 4 (four) hours as needed for moderate pain.   Yes Historical Provider, MD  lidocaine (XYLOCAINE) 5 % ointment Apply 1 application topically daily as needed. 11/26/14  Yes Historical Provider, MD  lisinopril (PRINIVIL,ZESTRIL) 40 MG tablet Take 40 mg  by mouth daily. PM   Yes Historical Provider, MD  naproxen (NAPROSYN) 500 MG tablet Take 1 tablet by mouth 2 (two) times daily. 11/02/14  Yes Historical Provider, MD  naproxen sodium (ANAPROX) 220 MG tablet Take 220 mg by mouth 2 (two) times daily with a meal.   Yes Historical Provider, MD  sertraline (ZOLOFT) 50 MG tablet Take 50 mg by mouth daily. PM   Yes Historical Provider, MD  HYDROcodone-acetaminophen (NORCO) 5-325 MG per tablet Take 1-2 tablets by mouth every 6 (six) hours as needed for moderate pain. MAXIMUM TOTAL ACETAMINOPHEN DOSE IS 4000 MG PER DAY 10/12/14   Leanor Kail, MD  HYDROcodone-acetaminophen (VICODIN) 5-500 MG per tablet Take 1 tablet by mouth 2 (two) times daily as needed. For pain    Historical Provider, MD  oxyCODONE (OXY IR/ROXICODONE) 5 MG immediate release tablet Take 5 mg by mouth every 4 (four) hours. 07/30/14   Historical Provider, MD  oxyCODONE-acetaminophen (PERCOCET) 5-325 MG per tablet  Take 1 tablet by mouth 2 (two) times daily.    Historical Provider, MD      VITAL SIGNS:  Blood pressure 145/96, pulse 87, temperature 98.1 F (36.7 C), temperature source Oral, resp. rate 16, height 5\' 11"  (1.803 m), weight 77.111 kg (170 lb), SpO2 99 %.  PHYSICAL EXAMINATION:  Physical Exam  Constitutional: He is oriented to person, place, and time. He appears well-developed and well-nourished. No distress.  HENT:  Head: Normocephalic and atraumatic.  Right Ear: External ear normal.  Left Ear: External ear normal.  Nose: Nose normal.  Mouth/Throat: Oropharynx is clear and moist. No oropharyngeal exudate.  Eyes: EOM are normal. Pupils are equal, round, and reactive to light. No scleral icterus.  Neck: Normal range of motion. Neck supple. No JVD present. No thyromegaly present.  Cardiovascular: Normal rate, regular rhythm, normal heart sounds and intact distal pulses.  Exam reveals no friction rub.   No murmur heard. Respiratory: Effort normal and breath sounds normal. No respiratory distress. He has no wheezes. He has no rales. He exhibits no tenderness.  GI: Soft. Bowel sounds are normal. He exhibits no distension and no mass. There is no tenderness. There is no rebound and no guarding.  Musculoskeletal: Normal range of motion. He exhibits no edema.  Lymphadenopathy:    He has no cervical adenopathy.  Neurological: He is alert and oriented to person, place, and time. He has normal reflexes. He displays normal reflexes. No cranial nerve deficit. He exhibits normal muscle tone.  Skin: Skin is warm. No rash noted. No erythema.  Psychiatric: He has a normal mood and affect. His behavior is normal. Thought content normal.   LABORATORY PANEL:   CBC  Recent Labs Lab 01/18/15 2209  WBC 8.7  HGB 7.9*  HCT 25.1*  PLT 243   ------------------------------------------------------------------------------------------------------------------  Chemistries   Recent Labs Lab  01/18/15 2209 01/19/15 0021  NA 141  --   K 3.5  --   CL 109  --   CO2 26  --   GLUCOSE 103*  --   BUN 24*  --   CREATININE 2.20*  --   CALCIUM 9.0  --   AST  --  51*  ALT  --  42  ALKPHOS  --  82  BILITOT  --  0.1*   ------------------------------------------------------------------------------------------------------------------  Cardiac Enzymes  Recent Labs Lab 01/18/15 2209  TROPONINI <0.03   ------------------------------------------------------------------------------------------------------------------  RADIOLOGY:  Dg Chest 2 View  01/18/2015   CLINICAL DATA:  Chest pain  EXAM: CHEST  2 VIEW  COMPARISON:  11/12/2011  FINDINGS: Normal heart size and mediastinal contours. No acute infiltrate or edema. No effusion or pneumothorax. No acute osseous findings. Cholecystectomy clips.  IMPRESSION: No active cardiopulmonary disease.   Electronically Signed   By: Monte Fantasia M.D.   On: 01/18/2015 22:23    EKG:   Orders placed or performed during the hospital encounter of 01/18/15  . ED EKG within 10 minutes  . ED EKG within 10 minutes  Normal sinus rhythm with ventricular rate of 74 bpm, T-wave abnormality  IMPRESSION AND PLAN:   1. Chest pain while attempting to drink diet soda-atypical chest pain, likely GI etiology-esophagitis versus gastritis. 2. Anemia, acute on chronic. H&H now 7.9/23.1. Most recent H&H on 01/08/2015 was 8.5/27.5. Patient hemodynamically stable. 3. Dark stools with the guaiac positive, likely upper gastrointestinal bleeding. Patient hemodynamically stable. Plan: Admit, nothing by mouth for now, IV hydration, IV Protonix, serial CBC and monitor H&H, GI consultation requested for further evaluation. Consider PRBC transfusion if H&H further drops. 4. Pica (eating paper). History of depression. No suicidal ideation. Psych consultation requested by the ED physician and pending at this time. Follow-up psych recommendation. 5. CK D stage III, stable  creatinine. Monitor BMP, avoid nephrotoxic agents. 6. HIV infection, stable on home medications. Continue same. 7. Hypertension, stable on home medications. Continue same. 8. Hepatitis C, stable, no acute problems. Monitor. 9. Elevated lipase, likely chronic secondary to CK D related. No symptoms, monitor.    All the records are reviewed and case discussed with ED provider. Management plans discussed with the patient, family and they are in agreement.  CODE STATUS: Full code  TOTAL TIME TAKING CARE OF THIS PATIENT: 50 minutes.    Azucena Freed N M.D on 01/19/2015 at 2:37 AM  Between 7am to 6pm - Pager - [phone]  After 6pm go to [email or web address] - password [email or web address] [place] [facility]  Office  [phone]  CC: Primary care physician; Rozanna Box, MD

## 2015-01-20 LAB — CBC
HCT: 29.4 % — ABNORMAL LOW (ref 40.0–52.0)
Hemoglobin: 9.2 g/dL — ABNORMAL LOW (ref 13.0–18.0)
MCH: 26.1 pg (ref 26.0–34.0)
MCHC: 31.4 g/dL — ABNORMAL LOW (ref 32.0–36.0)
MCV: 83.3 fL (ref 80.0–100.0)
PLATELETS: 187 10*3/uL (ref 150–440)
RBC: 3.53 MIL/uL — AB (ref 4.40–5.90)
RDW: 17.6 % — ABNORMAL HIGH (ref 11.5–14.5)
WBC: 6.3 10*3/uL (ref 3.8–10.6)

## 2015-01-20 LAB — BASIC METABOLIC PANEL
ANION GAP: 5 (ref 5–15)
BUN: 22 mg/dL — AB (ref 6–20)
CO2: 27 mmol/L (ref 22–32)
Calcium: 8.3 mg/dL — ABNORMAL LOW (ref 8.9–10.3)
Chloride: 108 mmol/L (ref 101–111)
Creatinine, Ser: 2.49 mg/dL — ABNORMAL HIGH (ref 0.61–1.24)
GFR calc Af Amer: 30 mL/min — ABNORMAL LOW (ref >60)
GFR, EST NON AFRICAN AMERICAN: 26 mL/min — AB (ref >60)
Glucose, Bld: 99 mg/dL (ref 65–99)
POTASSIUM: 4.3 mmol/L (ref 3.5–5.1)
SODIUM: 140 mmol/L (ref 135–145)

## 2015-01-20 LAB — TYPE AND SCREEN
ABO/RH(D): B POS
ANTIBODY SCREEN: NEGATIVE
UNIT DIVISION: 0
UNIT DIVISION: 0
Unit division: 0

## 2015-01-20 MED ORDER — EFAVIRENZ 600 MG PO TABS
600.0000 mg | ORAL_TABLET | Freq: Every day | ORAL | Status: DC
  Administered 2015-01-20 – 2015-01-22 (×3): 600 mg via ORAL
  Filled 2015-01-20 (×4): qty 1

## 2015-01-20 MED ORDER — EMTRICITABINE-TENOFOVIR DF 200-300 MG PO TABS
1.0000 | ORAL_TABLET | ORAL | Status: DC
  Administered 2015-01-21: 1 via ORAL
  Filled 2015-01-20 (×2): qty 1

## 2015-01-20 NOTE — Consult Note (Signed)
Pt on liquid diet, was transfused with hgb up to 9.2, WBC 6.3, creat 2.5, bun 22, plt 187, glu 99 BP 122/75, P 51, R 18, 100%  sat Chest clear. No new complaints.  Plan for EGD in morning.

## 2015-01-20 NOTE — Progress Notes (Signed)
Subjective: 64 year old male admitted with anemia, dysphagia and guaiac-positive stools. Still complains of difficulty swallowing. Mild heartburn. No abdominal pain at this time.  Objective: Vital signs in last 24 hours: Temp:  [97.8 F (36.6 C)-99.4 F (37.4 C)] 98.3 F (36.8 C) (08/21 1322) Pulse Rate:  [51-64] 54 (08/21 1322) Resp:  [18-20] 18 (08/21 1322) BP: (98-123)/(61-77) 98/64 mmHg (08/21 1322) SpO2:  [99 %-100 %] 100 % (08/21 1322) Weight:  [77.429 kg (170 lb 11.2 oz)] 77.429 kg (170 lb 11.2 oz) (08/21 0522) Weight change: 0.318 kg (11.2 oz) Last BM Date: 01/20/15  Intake/Output from previous day: 08/20 0701 - 08/21 0700 In: 1420 [P.O.:740; Blood:680] Out: 825 [Urine:825] Intake/Output this shift: Total I/O In: 1480 [P.O.:1480] Out: -   General: alert, oriented x 3, NAD HENT: no JVD, no pallor, no icterus Chest: clear to auscultation CVS: RRR, S1S2, no tachycardia Abdomen: soft, nontender, no hepatosplenomegaly Ext: no lower leg edema.  Neuro: Normal strength and tone in bilateral upper and lower extremities.  Lab Results:  Recent Labs  01/19/15 1452 01/20/15 0449  WBC 6.2 6.3  HGB 6.9* 9.2*  HCT 22.4* 29.4*  PLT 185 187   BMET  Recent Labs  01/19/15 0342 01/20/15 0449  NA 141 140  K 3.7 4.3  CL 112* 108  CO2 25 27  GLUCOSE 104* 99  BUN 20 22*  CREATININE 2.00* 2.49*  CALCIUM 8.5* 8.3*    Studies/Results: Dg Chest 2 View  01/18/2015   CLINICAL DATA:  Chest pain  EXAM: CHEST  2 VIEW  COMPARISON:  11/12/2011  FINDINGS: Normal heart size and mediastinal contours. No acute infiltrate or edema. No effusion or pneumothorax. No acute osseous findings. Cholecystectomy clips.  IMPRESSION: No active cardiopulmonary disease.   Electronically Signed   By: Monte Fantasia M.D.   On: 01/18/2015 22:23    Medications:  Scheduled Meds: . allopurinol  100 mg Oral Daily  . allopurinol  300 mg Oral Daily  . atenolol  100 mg Oral Daily  . chlorthalidone   25 mg Oral Daily  . cloNIDine  0.1 mg Oral Daily  . cyclobenzaprine  10 mg Oral Daily  . docusate sodium  100 mg Oral BID  . doxazosin  8 mg Oral QHS  . efavirenz-emtricitabine-tenofovir  1 tablet Oral QHS  . ferrous sulfate  325 mg Oral BID  . folic acid  1 mg Oral Daily  . hydrALAZINE  50 mg Oral TID  . lisinopril  40 mg Oral Daily  . nystatin  5 mL Oral QID  . oxyCODONE-acetaminophen  1 tablet Oral BID  . pantoprazole (PROTONIX) IV  40 mg Intravenous Q12H  . sertraline  50 mg Oral Daily  . sodium chloride  3 mL Intravenous Q12H   Continuous Infusions: . sodium chloride 75 mL/hr at 01/20/15 0705   PRN Meds:.diazepam, lidocaine, ondansetron **OR** ondansetron (ZOFRAN) IV   Assessment/Plan: 64 year old male with history of depression, HIV, chronic hepatitis C, stage III chronic kidney disease admitted with anemia, pica, dysphagia and guaiac-positive stools. Patient reports eating 20-30 sheets of paper on a daily basis for several months. HIV under treatment at [facility]. Undetectable viral load as per patient and wife. Chronic hepatitis C is closely followed by [facility].   Dysphagia, anemia, acute on chronic GI bleed: Patient received 2 units of packed RBCs overnight for hemoglobin of 6.9 yesterday. Hemoglobin improved to 9.2 today. Monitor for active GI bleeding. Continue IV Protonix and IV fluids. Nystatin 5 mL four times daily.  Viscous lidocaine swish and swallow for dysphagia as needed. Zofran as needed for nausea. Clear liquid diet. Patient is scheduled for upper endoscopy tomorrow. GI input appreciated.  Stage 3 chronic kidney disease: Acute worsening of chronic renal insufficiency, creatinine 2.49 and GFR 30 today. Continue IV hydration. Monitor renal function and electrolytes.  Depression and PICA: evaluated by psychiatry, continue sertraline 50 mg daily.   LOS: 1 day   Margret Moat 01/20/2015, 1:42 PM

## 2015-01-20 NOTE — Plan of Care (Signed)
Problem: Discharge Progression Outcomes Goal: Other Discharge Outcomes/Goals Outcome: Progressing Plan of care progress to goal:  Patient complaining of mid chest pain - scheduled pain medication given, relief noted. Telemetry monitoring continues. No active bleeding at this time. Patient to have swallowing test tomorrow - will be NPO after midnight. TEDs and SCDs placed. Wife at bedside.

## 2015-01-20 NOTE — Progress Notes (Signed)
MEDICATION RELATED NOTE - INITIAL   Renal Function and HIV medication.  Patient on Atripla at home (Efavirenz 600mg /Emtricitabine 200mg /Tenofovir 300mg )   Allergies  Allergen Reactions  . Buchu-Cornsilk-Ch Grass-Hydran Other (See Comments)    Gout.  . Colchicine Diarrhea    "Have diarrhea when taken for long periods of time"  . Nifedipine Other (See Comments)    "Unknown"  . Oxycodone-Acetaminophen Other (See Comments)    GI upset.  . Tramadol Other (See Comments)    "unknown"    Patient Measurements: Height: 5\' 11"  (180.3 cm) Weight: 170 lb 11.2 oz (77.429 kg) IBW/kg (Calculated) : 75.3 Adjusted Body Weight:  Vital Signs: Temp: 98.3 F (36.8 C) (08/21 1322) Temp Source: Oral (08/21 1322) BP: 98/64 mmHg (08/21 1322) Pulse Rate: 54 (08/21 1322)  Labs:  Recent Labs  01/18/15 2209 01/19/15 0021 01/19/15 0342  01/19/15 1153 01/19/15 1452 01/20/15 0449  WBC 8.7  --  6.2  < > 6.9 6.2 6.3  HGB 7.9*  --  7.9*  < > 7.3* 6.9* 9.2*  HCT 25.1*  --  25.1*  < > 23.7* 22.4* 29.4*  PLT 243  --  200  < > 201 185 187  CREATININE 2.20*  --  2.00*  --   --   --  2.49*  ALBUMIN  --  3.7  --   --   --   --   --   PROT  --  7.2  --   --   --   --   --   AST  --  51*  --   --   --   --   --   ALT  --  42  --   --   --   --   --   ALKPHOS  --  82  --   --   --   --   --   BILITOT  --  0.1*  --   --   --   --   --   BILIDIR  --  <0.1*  --   --   --   --   --   IBILI  --  NOT CALCULATED  --   --   --   --   --   < > = values in this interval not displayed. Estimated Creatinine Clearance: 32.3 mL/min (by C-G formula based on Cr of 2.49).   Microbiology: No results found for this or any previous visit (from the past 720 hour(s)).  Medical History: Past Medical History  Diagnosis Date  . Gout   . Hypertension   . Heart murmur   . Aortic stenosis, mild     mild AS by echo, 08/2011  . Chronic kidney disease     kidney stones  . HIV positive 09/25/11  . Hepatitis C 09/25/11   . Full dentures     upper and lower  . Hep C w/o coma, chronic 01/19/2015  . Anemia   . Depression     Medications:  Scheduled:  . allopurinol  100 mg Oral Daily  . allopurinol  300 mg Oral Daily  . atenolol  100 mg Oral Daily  . chlorthalidone  25 mg Oral Daily  . cloNIDine  0.1 mg Oral Daily  . cyclobenzaprine  10 mg Oral Daily  . docusate sodium  100 mg Oral BID  . doxazosin  8 mg Oral QHS  . efavirenz  600 mg Oral QHS  . [START ON  01/21/2015] emtricitabine-tenofovir  1 tablet Oral Q48H  . ferrous sulfate  325 mg Oral BID  . folic acid  1 mg Oral Daily  . hydrALAZINE  50 mg Oral TID  . lisinopril  40 mg Oral Daily  . nystatin  5 mL Oral QID  . oxyCODONE-acetaminophen  1 tablet Oral BID  . pantoprazole (PROTONIX) IV  40 mg Intravenous Q12H  . sertraline  50 mg Oral Daily  . sodium chloride  3 mL Intravenous Q12H    Assessment: 64 yo male on Atripla combination therapy (Efavirenz 600mg /Emtricitabine 200mg /Tenofovir 300mg ) for HIV at home.  Scr 2.49  estCrcl 32 ml/min  Plan:  Will separate into componet medications to be able to adjust the medications that need to be renally adjusted. Will continue Efavirenz 600mg  at bedtime (does not need adjustment). Will transition Emtricitabine 200mg  and Tenofovir 300mg  to Q48h dosing based on Crcl < 50 ml/min.  Chinita Greenland PharmD Clinical Pharmacist 01/20/2015

## 2015-01-20 NOTE — Plan of Care (Signed)
Problem: Discharge Progression Outcomes Goal: Other Discharge Outcomes/Goals Outcome: Progressing Plan of Care Progress to Goal:   Pt received 2 units of blood in the past 24hr. Pt report chest pain. Pt tolerated blood well. No other signs of distress noted. Will continue to monitor.

## 2015-01-20 NOTE — Care Management Note (Signed)
Case Management Note  Patient Details  Name: Jared Walker MRN: [id number] Date of Birth: 07/06/1950  Subjective/Objective:   64yo Mr Jared Walker was admitted 01/18/15 per c/o chest pain. He also has chronic anemia and his Hgb=7.9 upon admission. He has guaiac positive stool. Hx of numerous medical issues. Resides at home with his wife. PCP=Dr Janalyn Rouse. Pharmacy=[facility] in [place] equipment= cane, front wheel rolling walker. No home oxygen, no home health services. Case management will follow for discharge planning.                  Action/Plan:   Expected Discharge Date:                  Expected Discharge Plan:     In-House Referral:     Discharge planning Services     Post Acute Care Choice:    Choice offered to:     DME Arranged:    DME Agency:     HH Arranged:    [place] Agency:     Status of Service:     Medicare Important Message Given:    Date Medicare IM Given:    Medicare IM give by:    Date Additional Medicare IM Given:    Additional Medicare Important Message give by:     If discussed at [place] of Stay Meetings, dates discussed:    Additional Comments:  Ryleah Miramontes A, RN 01/20/2015, 3:30 PM

## 2015-01-21 ENCOUNTER — Inpatient Hospital Stay: Payer: Medicare Other | Admitting: Anesthesiology

## 2015-01-21 ENCOUNTER — Encounter
Admission: EM | Disposition: A | Discharge status: Home / Self Care | Payer: Self-pay | Source: Home / Self Care | Attending: Internal Medicine

## 2015-01-21 ENCOUNTER — Encounter: Payer: Self-pay | Admitting: *Deleted

## 2015-01-21 HISTORY — PX: ESOPHAGOGASTRODUODENOSCOPY (EGD) WITH PROPOFOL: SHX5813

## 2015-01-21 LAB — COMPREHENSIVE METABOLIC PANEL
ALBUMIN: 2.8 g/dL — AB (ref 3.5–5.0)
ALT: 30 U/L (ref 17–63)
AST: 32 U/L (ref 15–41)
Alkaline Phosphatase: 71 U/L (ref 38–126)
Anion gap: 6 (ref 5–15)
BUN: 19 mg/dL (ref 6–20)
CHLORIDE: 109 mmol/L (ref 101–111)
CO2: 26 mmol/L (ref 22–32)
CREATININE: 2.16 mg/dL — AB (ref 0.61–1.24)
Calcium: 7.8 mg/dL — ABNORMAL LOW (ref 8.9–10.3)
GFR calc Af Amer: 36 mL/min — ABNORMAL LOW (ref >60)
GFR, EST NON AFRICAN AMERICAN: 31 mL/min — AB (ref >60)
GLUCOSE: 101 mg/dL — AB (ref 65–99)
Potassium: 4 mmol/L (ref 3.5–5.1)
SODIUM: 141 mmol/L (ref 135–145)
Total Bilirubin: <0.1 mg/dL — ABNORMAL LOW (ref 0.3–1.2)
Total Protein: 5.9 g/dL — ABNORMAL LOW (ref 6.5–8.1)

## 2015-01-21 LAB — URINALYSIS COMPLETE WITH MICROSCOPIC (ARMC ONLY)
Bilirubin Urine: NEGATIVE
Glucose, UA: NEGATIVE mg/dL
Hgb urine dipstick: NEGATIVE
KETONES UR: NEGATIVE mg/dL
Nitrite: POSITIVE — AB
PH: 6 (ref 5.0–8.0)
PROTEIN: NEGATIVE mg/dL
Specific Gravity, Urine: 1.013 (ref 1.005–1.030)

## 2015-01-21 LAB — CBC WITH DIFFERENTIAL/PLATELET
BASOS ABS: 0 10*3/uL (ref 0–0.1)
BASOS PCT: 0 %
EOS PCT: 1 %
Eosinophils Absolute: 0 10*3/uL (ref 0–0.7)
HCT: 27.1 % — ABNORMAL LOW (ref 40.0–52.0)
Hemoglobin: 8.6 g/dL — ABNORMAL LOW (ref 13.0–18.0)
LYMPHS PCT: 20 %
Lymphs Abs: 1.1 10*3/uL (ref 1.0–3.6)
MCH: 26.3 pg (ref 26.0–34.0)
MCHC: 31.9 g/dL — ABNORMAL LOW (ref 32.0–36.0)
MCV: 82.4 fL (ref 80.0–100.0)
Monocytes Absolute: 0.5 10*3/uL (ref 0.2–1.0)
Monocytes Relative: 10 %
Neutro Abs: 3.7 10*3/uL (ref 1.4–6.5)
Neutrophils Relative %: 69 %
PLATELETS: 169 10*3/uL (ref 150–440)
RBC: 3.29 MIL/uL — AB (ref 4.40–5.90)
RDW: 17.6 % — ABNORMAL HIGH (ref 11.5–14.5)
WBC: 5.4 10*3/uL (ref 3.8–10.6)

## 2015-01-21 SURGERY — ESOPHAGOGASTRODUODENOSCOPY (EGD) WITH PROPOFOL
Anesthesia: General

## 2015-01-21 SURGERY — EGD (ESOPHAGOGASTRODUODENOSCOPY)
Anesthesia: Moderate Sedation

## 2015-01-21 MED ORDER — DEXTROSE 5 % IV SOLN
1.0000 g | Freq: Once | INTRAVENOUS | Status: DC
  Filled 2015-01-21: qty 10

## 2015-01-21 MED ORDER — PROPOFOL INFUSION 10 MG/ML OPTIME
INTRAVENOUS | Status: DC | PRN
  Administered 2015-01-21: 100 ug/kg/min via INTRAVENOUS

## 2015-01-21 MED ORDER — DEXTROSE 5 % IV SOLN
2.0000 g | Freq: Once | INTRAVENOUS | Status: AC
  Administered 2015-01-22: 2 g via INTRAVENOUS
  Filled 2015-01-21: qty 2

## 2015-01-21 MED ORDER — DEXTROSE 5 % IV SOLN
1.0000 g | INTRAVENOUS | Status: DC
  Filled 2015-01-21: qty 10

## 2015-01-21 MED ORDER — FENTANYL CITRATE (PF) 100 MCG/2ML IJ SOLN
INTRAMUSCULAR | Status: DC | PRN
  Administered 2015-01-21: 50 ug via INTRAVENOUS

## 2015-01-21 MED ORDER — DEXTROSE 5 % IV SOLN
2.0000 g | INTRAVENOUS | Status: DC
  Filled 2015-01-21: qty 2

## 2015-01-21 NOTE — Plan of Care (Signed)
Problem: Discharge Progression Outcomes Goal: Other Discharge Outcomes/Goals Outcome: Progressing Plan of care progress to goal:  No complaints of pain.Telemetry monitoring continues. EGD completed. Diet resumed. Wife at bedside.

## 2015-01-21 NOTE — Care Management (Signed)
Patient with admitted chest pain and GI bleed.  Patient lives at home with wife.  Patient states that he uses [facility] in [place] to obtain his medication  Patient states that he has walker and cane at home and typically uses a cane to ambulate.  Patient states that he would like to be able to have hospital bed in the home.  I let the patient and his wife know that they would need a qualifying rx from their physician, or it would be an out of cost expense.  Will continue to follow for discharge planning

## 2015-01-21 NOTE — Op Note (Signed)
[facility] Patient Name: Jared Walker Procedure Date: 01/21/2015 10:12 AM MRN: [id number] Account #: [id number] Date of Birth: 04/25/1951 Admit Type: Inpatient Age: 64 Room: [facility] ENDO ROOM 1 Gender: Male Note Status: Finalized Procedure:         Upper GI endoscopy Indications:       Dysphagia Providers:         Manya Silvas, MD Referring MD:      Tracie Harrier, MD (Referring MD) Medicines:         Propofol per Anesthesia Complications:     No immediate complications. Procedure:         Pre-Anesthesia Assessment:                    - After reviewing the risks and benefits, the patient was                     deemed in satisfactory condition to undergo the procedure.                    After obtaining informed consent, the endoscope was passed                     under direct vision. Throughout the procedure, the                     patient's blood pressure, pulse, and oxygen saturations                     were monitored continuously. The Endoscope was introduced                     through the mouth, and advanced to the second part of                     duodenum. The upper GI endoscopy was accomplished without                     difficulty. The patient tolerated the procedure well. Findings:      Few cratered and linear esophageal ulcers with no bleeding and no       stigmata of recent bleeding were found 30 cm from the incisors. They       extended for 8cm in length. Biopsies were taken with a cold forceps for       histology. Dr. Gustavo Lah looked at tthe picture of the linear ulcers.      Patchy minimal inflammation characterized by erythema and granularity       was found in the gastric antrum.      The examined duodenum was normal. Impression:        - Non-bleeding esophageal ulcers. Biopsied.                    - Gastritis.                    - Normal examined duodenum. Recommendation:    - Await pathology results. Check CMV  blood tests. Manya Silvas, MD 01/21/2015 10:41:57 AM This report has been signed electronically. Number of Addenda: 0 Note Initiated On: 01/21/2015 10:12 AM      [facility]

## 2015-01-21 NOTE — Progress Notes (Signed)
Pt's urinalysis came back positive, MD notified and will put in orders.

## 2015-01-21 NOTE — Care Management Important Message (Signed)
Important Message  Patient Details  Name: Jared Walker MRN: [id number] Date of Birth: 01-10-1951   Medicare Important Message Given:  Yes-second notification given    [name] 01/21/2015, 1:43 PM

## 2015-01-21 NOTE — Transfer of Care (Signed)
Immediate Anesthesia Transfer of Care Note  Patient: Jared Walker  Procedure(s) Performed: Procedure(s): ESOPHAGOGASTRODUODENOSCOPY (EGD) WITH PROPOFOL (N/A)  Patient Location: PACU  Anesthesia Type:General  Level of Consciousness: awake  Airway & Oxygen Therapy: Patient connected to nasal cannula oxygen  Post-op Assessment: Report given to RN  Post vital signs: stable  Last Vitals:  Filed Vitals:   01/21/15 1042  BP:   Pulse:   Temp: 36.1 C  Resp:     Complications: No apparent anesthesia complications

## 2015-01-21 NOTE — Anesthesia Preprocedure Evaluation (Addendum)
Anesthesia Evaluation  Patient identified by MRN, date of birth, ID band Patient awake    Reviewed: Allergy & Precautions, NPO status , Patient's Chart, lab work & pertinent test results  History of Anesthesia Complications Negative for: history of anesthetic complications  Airway Mallampati: II  TM Distance: >3 FB Neck ROM: Full    Dental  (+) Upper Dentures, Lower Dentures   Pulmonary former smoker (quit x 30 yrs),          Cardiovascular hypertension, Pt. on medications + Valvular Problems/Murmurs (mild by echo) AS     Neuro/Psych Depression    GI/Hepatic GERD-  Medicated,(+) Hepatitis -, C  Endo/Other    Renal/GU Renal Insufficiency and CRFRenal disease     Musculoskeletal   Abdominal   Peds  Hematology  (+) anemia ,   Anesthesia Other Findings   Reproductive/Obstetrics                           Anesthesia Physical Anesthesia Plan  ASA: III  Anesthesia Plan: General   Post-op Pain Management:    Induction: Intravenous  Airway Management Planned:   Additional Equipment:   Intra-op Plan:   Post-operative Plan:   Informed Consent: I have reviewed the patients History and Physical, chart, labs and discussed the procedure including the risks, benefits and alternatives for the proposed anesthesia with the patient or authorized representative who has indicated his/her understanding and acceptance.     Plan Discussed with:   Anesthesia Plan Comments:         Anesthesia Quick Evaluation

## 2015-01-21 NOTE — Consult Note (Signed)
Pt EGD done for dysphagia and anemia show very long and deep linear ulcerations of the esophagus, suggesting CMV infection.  Bx done and will check CMV panel.  Pureed diet recommended.

## 2015-01-21 NOTE — Anesthesia Postprocedure Evaluation (Signed)
  Anesthesia Post-op Note  Patient: Jared Walker  Procedure(s) Performed: Procedure(s): ESOPHAGOGASTRODUODENOSCOPY (EGD) WITH PROPOFOL (N/A)  Anesthesia type:General  Patient location: PACU  Post pain: Pain level controlled  Post assessment: Post-op Vital signs reviewed, Patient's Cardiovascular Status Stable, Respiratory Function Stable, Patent Airway and No signs of Nausea or vomiting  Post vital signs: Reviewed and stable  Last Vitals:  Filed Vitals:   01/21/15 1145  BP: 119/70  Pulse: 64  Temp: 36.6 C  Resp: 19    Level of consciousness: awake, alert  and patient cooperative  Complications: No apparent anesthesia complications

## 2015-01-21 NOTE — Progress Notes (Signed)
ANTIBIOTIC CONSULT NOTE - INITIAL  Pharmacy Consult for ceftriaxone dosing Indication: UTI  Allergies  Allergen Reactions  . Buchu-Cornsilk-Ch Grass-Hydran Other (See Comments)    Gout.  . Colchicine Diarrhea    "Have diarrhea when taken for long periods of time"  . Nifedipine Other (See Comments)    "Unknown"  . Oxycodone-Acetaminophen Other (See Comments)    GI upset.  . Tramadol Other (See Comments)    "unknown"    Patient Measurements: Height: 5\' 11"  (180.3 cm) Weight: 178 lb (80.74 kg) IBW/kg (Calculated) : 75.3 Adjusted Body Weight: n/a  Vital Signs: Temp: 98.1 F (36.7 C) (08/22 2104) Temp Source: Oral (08/22 2104) BP: 140/75 mmHg (08/22 2104) Pulse Rate: 72 (08/22 2104) Intake/Output from previous day: 08/21 0701 - 08/22 0700 In: 2531.3 [P.O.:1720; I.V.:811.3] Out: 250 [Urine:250] Intake/Output from this shift:    Labs:  Recent Labs  01/19/15 0342  01/19/15 1452 01/20/15 0449 01/21/15 0433  WBC 6.2  < > 6.2 6.3 5.4  HGB 7.9*  < > 6.9* 9.2* 8.6*  PLT 200  < > 185 187 169  CREATININE 2.00*  --   --  2.49* 2.16*  < > = values in this interval not displayed. Estimated Creatinine Clearance: 37.3 mL/min (by C-G formula based on Cr of 2.16). No results for input(s): VANCOTROUGH, VANCOPEAK, VANCORANDOM, GENTTROUGH, GENTPEAK, GENTRANDOM, TOBRATROUGH, TOBRAPEAK, TOBRARND, AMIKACINPEAK, AMIKACINTROU, AMIKACIN in the last 72 hours.   Microbiology: No results found for this or any previous visit (from the past 720 hour(s)).  Medical History: Past Medical History  Diagnosis Date  . Gout   . Hypertension   . Heart murmur   . Aortic stenosis, mild     mild AS by echo, 08/2011  . Chronic kidney disease     kidney stones  . HIV positive 09/25/11  . Hepatitis C 09/25/11  . Full dentures     upper and lower  . Hep C w/o coma, chronic 01/19/2015  . Anemia   . Depression     Medications:   Assessment: UA: LE(tr) NO2(+) WBC 6-30  Goal of Therapy:   Resolve infection  Plan:  Ceftriaxone 2 grams q 24 hours ordered.   Sim Boast, PharmD, BCPS  01/21/2015

## 2015-01-21 NOTE — Progress Notes (Signed)
Subjective: 64 year old male admitted with anemia, dysphagia and guaiac-positive stools. Pt also has PICA and wife tells me he eats paper every day  Chest pain is better.Eating better EGD showed evidence for linear deep ulcers in Esophagus suggestive of CMV infection  Objective: Vital signs in last 24 hours: Temp:  [96.4 F (35.8 C)-98.3 F (36.8 C)] 97.9 F (36.6 C) (08/22 1145) Pulse Rate:  [52-66] 64 (08/22 1145) Resp:  [12-19] 19 (08/22 1145) BP: (89-119)/(50-98) 119/70 mmHg (08/22 1145) SpO2:  [99 %-100 %] 100 % (08/22 1145) Weight:  [80.74 kg (178 lb)-81.012 kg (178 lb 9.6 oz)] 80.74 kg (178 lb) (08/22 1001) Weight change: 3.583 kg (7 lb 14.4 oz) Last BM Date: 01/20/15  Intake/Output from previous day: 08/21 0701 - 08/22 0700 In: 2531.3 [P.O.:1720; I.V.:811.3] Out: 250 [Urine:250] Intake/Output this shift: Total I/O In: 100 [I.V.:100] Out: -   General: alert, oriented x 3, NAD HENT: no JVD, no pallor, no icterus Chest: clear to auscultation CVS: RRR, S1S2, no tachycardia Abdomen: soft, nontender, no hepatosplenomegaly Ext: no lower leg edema.  Neuro: Normal strength and tone in bilateral upper and lower extremities.  Lab Results:  Recent Labs  01/20/15 0449 01/21/15 0433  WBC 6.3 5.4  HGB 9.2* 8.6*  HCT 29.4* 27.1*  PLT 187 169   BMET  Recent Labs  01/20/15 0449 01/21/15 0433  NA 140 141  K 4.3 4.0  CL 108 109  CO2 27 26  GLUCOSE 99 101*  BUN 22* 19  CREATININE 2.49* 2.16*  CALCIUM 8.3* 7.8*    Studies/Results: No results found.  Medications:  Scheduled Meds: . allopurinol  100 mg Oral Daily  . allopurinol  300 mg Oral Daily  . atenolol  100 mg Oral Daily  . chlorthalidone  25 mg Oral Daily  . cloNIDine  0.1 mg Oral Daily  . cyclobenzaprine  10 mg Oral Daily  . docusate sodium  100 mg Oral BID  . doxazosin  8 mg Oral QHS  . efavirenz  600 mg Oral QHS  . emtricitabine-tenofovir  1 tablet Oral Q48H  . ferrous sulfate  325 mg Oral BID   . folic acid  1 mg Oral Daily  . hydrALAZINE  50 mg Oral TID  . lisinopril  40 mg Oral Daily  . nystatin  5 mL Oral QID  . oxyCODONE-acetaminophen  1 tablet Oral BID  . pantoprazole (PROTONIX) IV  40 mg Intravenous Q12H  . sertraline  50 mg Oral Daily  . sodium chloride  3 mL Intravenous Q12H   Continuous Infusions: . sodium chloride 75 mL/hr at 01/21/15 0745   PRN Meds:.diazepam, lidocaine, ondansetron **OR** ondansetron (ZOFRAN) IV   Assessment/Plan:     1Dysphagia, anemia, acute on chronic GI bleed: Patient received 2 units of packed RBCs ov Hemoglobin is 8.6 today. EGD: Deep linear ulcers consistent with CMV infection. Continue IV Protonix and IV fluids. Nystatin 5 mL four times daily. Viscous lidocaine swish and swallow for dysphagia as needed. Zofran as needed for nausea.  Pureed diet today  2 Stage 3 chronic kidney disease: Se Creat improved to 2.16- Continue IV Fluids  3 Depression and PICA: evaluated by psychiatry,. On Sertraline 50 mg daily. 4 HTN: Continue Lisinopril. 5 Chronic back pain/Hx of Back surgery; On Oxycodone as needed 6 Hx of HIV: Continue meds. Discussed with wife    LOS: 2 days   Melane Windholz 01/21/2015, 1:05 PM

## 2015-01-22 LAB — CELIAC DISEASE PANEL
Endomysial Ab, IgA: NEGATIVE
IgA: 462 mg/dL — ABNORMAL HIGH (ref 61–437)
Tissue Transglutaminase Ab, IgA: <2 U/mL (ref 0–3)

## 2015-01-22 LAB — BASIC METABOLIC PANEL
Anion gap: 6 (ref 5–15)
BUN: 17 mg/dL (ref 6–20)
CO2: 24 mmol/L (ref 22–32)
CREATININE: 1.87 mg/dL — AB (ref 0.61–1.24)
Calcium: 7.7 mg/dL — ABNORMAL LOW (ref 8.9–10.3)
Chloride: 111 mmol/L (ref 101–111)
GFR calc Af Amer: 42 mL/min — ABNORMAL LOW (ref >60)
GFR, EST NON AFRICAN AMERICAN: 37 mL/min — AB (ref >60)
GLUCOSE: 99 mg/dL (ref 65–99)
Potassium: 3.5 mmol/L (ref 3.5–5.1)
SODIUM: 141 mmol/L (ref 135–145)

## 2015-01-22 LAB — CBC WITH DIFFERENTIAL/PLATELET
Basophils Absolute: 0 10*3/uL (ref 0–0.1)
Basophils Relative: 0 %
EOS ABS: 0 10*3/uL (ref 0–0.7)
EOS PCT: 1 %
HCT: 28.1 % — ABNORMAL LOW (ref 40.0–52.0)
Hemoglobin: 9.1 g/dL — ABNORMAL LOW (ref 13.0–18.0)
LYMPHS ABS: 1.1 10*3/uL (ref 1.0–3.6)
LYMPHS PCT: 22 %
MCH: 27 pg (ref 26.0–34.0)
MCHC: 32.4 g/dL (ref 32.0–36.0)
MCV: 83.2 fL (ref 80.0–100.0)
MONO ABS: 0.5 10*3/uL (ref 0.2–1.0)
MONOS PCT: 9 %
Neutro Abs: 3.5 10*3/uL (ref 1.4–6.5)
Neutrophils Relative %: 68 %
PLATELETS: 170 10*3/uL (ref 150–440)
RBC: 3.38 MIL/uL — ABNORMAL LOW (ref 4.40–5.90)
RDW: 18 % — ABNORMAL HIGH (ref 11.5–14.5)
WBC: 5.1 10*3/uL (ref 3.8–10.6)

## 2015-01-22 NOTE — Progress Notes (Signed)
Subjective: 64 year old male admitted with anemia, dysphagia and guaiac-positive stools. Pt also has PICA and wife tells me he eats paper every day  Chest pain is better.Eating better EGD showed evidence for linear deep ulcers in Esophagus suggestive of CMV infection  Objective: Vital signs in last 24 hours: Temp:  [96.4 F (35.8 C)-98.4 F (36.9 C)] 98.4 F (36.9 C) (08/23 0531) Pulse Rate:  [52-81] 81 (08/23 0531) Resp:  [12-20] 20 (08/23 0531) BP: (89-140)/(55-98) 126/65 mmHg (08/23 0531) SpO2:  [99 %-100 %] 100 % (08/23 0531) Weight:  [80.287 kg (177 lb)-80.74 kg (178 lb)] 80.287 kg (177 lb) (08/23 0531) Weight change: -0.272 kg (-9.6 oz) Last BM Date: 01/21/15  Intake/Output from previous day: 08/22 0701 - 08/23 0700 In: 1176.3 [P.O.:480; I.V.:696.3] Out: 600 [Urine:600] Intake/Output this shift:    General: alert, oriented x 3, NAD HENT: no JVD, no pallor, no icterus Chest: clear to auscultation CVS: RRR, S1S2, no tachycardia Abdomen: soft, nontender, no hepatosplenomegaly Ext: no lower leg edema.  Neuro: Normal strength and tone in bilateral upper and lower extremities.  Lab Results:  Recent Labs  01/21/15 0433 01/22/15 0425  WBC 5.4 5.1  HGB 8.6* 9.1*  HCT 27.1* 28.1*  PLT 169 170   BMET  Recent Labs  01/21/15 0433 01/22/15 0425  NA 141 141  K 4.0 3.5  CL 109 111  CO2 26 24  GLUCOSE 101* 99  BUN 19 17  CREATININE 2.16* 1.87*  CALCIUM 7.8* 7.7*    Studies/Results: No results found.  Medications:  Scheduled Meds: . allopurinol  100 mg Oral Daily  . allopurinol  300 mg Oral Daily  . atenolol  100 mg Oral Daily  . cefTRIAXone (ROCEPHIN) IVPB 1 gram/50 mL D5W  1 g Intravenous Q24H  . cefTRIAXone (ROCEPHIN) IVPB 2 gram/50 mL D5W (Pyxis)  2 g Intravenous Q24H  . chlorthalidone  25 mg Oral Daily  . cloNIDine  0.1 mg Oral Daily  . cyclobenzaprine  10 mg Oral Daily  . docusate sodium  100 mg Oral BID  . doxazosin  8 mg Oral QHS  . efavirenz   600 mg Oral QHS  . emtricitabine-tenofovir  1 tablet Oral Q48H  . ferrous sulfate  325 mg Oral BID  . folic acid  1 mg Oral Daily  . hydrALAZINE  50 mg Oral TID  . lisinopril  40 mg Oral Daily  . nystatin  5 mL Oral QID  . oxyCODONE-acetaminophen  1 tablet Oral BID  . pantoprazole (PROTONIX) IV  40 mg Intravenous Q12H  . sertraline  50 mg Oral Daily  . sodium chloride  3 mL Intravenous Q12H   Continuous Infusions: . sodium chloride 75 mL/hr at 01/21/15 1839   PRN Meds:.diazepam, lidocaine, ondansetron **OR** ondansetron (ZOFRAN) IV   Assessment/Plan:     1Dysphagia, anemia, acute on chronic GI bleed: Patient received 2 units of packed RBCs ov Hemoglobin is 8.6 today. EGD: Deep linear ulcers consistent with CMV infection. Continue IV Protonix and IV fluids. Nystatin 5 mL four times daily. Viscous lidocaine swish and swallow for dysphagia as needed. Zofran as needed for nausea.  Pureed diet today Hgb stable at 9.1 Will discuss with GI regarding CMV panel and treatment options   2 Stage 3 chronic kidney disease: Se Creat improved to 1.87- Continue IV Fluids  3 Depression and PICA: evaluated by psychiatry,. On Sertraline 50 mg daily. 4 HTN: Continue Lisinopril. 5 Chronic back pain/Hx of Back surgery; On Oxycodone as needed 6 Hx  of HIV: Continue meds. Discussed with wife    LOS: 3 days   Jared Walker 01/22/2015, 8:26 AM

## 2015-01-22 NOTE — Consult Note (Signed)
[facility] Infectious Disease     Reason for Consult: Esophageal ulcers in HIV patient   Referring Physician: Dr Roetta Sessions Date of Admission:  01/18/2015   Principal Problem:   Chest pain Active Problems:   Anemia   GI bleed   Pica in adults   CKD (chronic kidney disease), stage III   HIV (human immunodeficiency virus infection)   HTN (hypertension)   Hep C w/o coma, chronic   CKD (chronic kidney disease)   HPI: Jared Walker is a 64 y.o. male well controlled HIV (last CD4 514 count and VL undetectable in March 2016) on Atripla as well as HCV admitted with severe CP with swallowing. He also has Pica and eats a good deal of paper. He underwent EGD with non bleeding esophageal ulcers found- was biopsied. No evidence thrush. He also has a progressive anemia with hgb 11 in march down to 8.   Past Medical History  Diagnosis Date  . Gout   . Hypertension   . Heart murmur   . Aortic stenosis, mild     mild AS by echo, 08/2011  . Chronic kidney disease     kidney stones  . HIV positive 09/25/11  . Hepatitis C 09/25/11  . Full dentures     upper and lower  . Hep C w/o coma, chronic 01/19/2015  . Anemia   . Depression    Past Surgical History  Procedure Laterality Date  . Cholecystectomy    . Closed reduction pelvic fracture    . Cardiovascular stress test      at [facility]  . Knee arthroscopy  2009    Right  . Nephrostomy      tube Left  . Lumbar laminectomy/decompression microdiscectomy  09/25/2011    Procedure: LUMBAR LAMINECTOMY/DECOMPRESSION MICRODISCECTOMY;  Surgeon: Erline Levine, MD;  Location: [facility] NEURO ORS;  Service: Neurosurgery;  Laterality: Left;  Left Lumbar Four-Five Microdiskectomy  . Shoulder arthroscopy with open rotator cuff repair Right 10/12/2014    Procedure: SHOULDER ARTHROSCOP with decompression;  Surgeon: Leanor Kail, MD;  Location: [facility];  Service: Orthopedics;  Laterality: Right;  . Fracture surgery    . Back surgery     Social  History  Substance Use Topics  . Smoking status: Former Research scientist (life sciences)  . Smokeless tobacco: Former Systems developer    Quit date: 03/13/1972  . Alcohol Use: No   Family History  Problem Relation Age of Onset  . Anesthesia problems Neg Hx   . Hypertension Mother   . Cancer Father   . Heart disease Sister   . Diabetes Brother     Allergies:  Allergies  Allergen Reactions  . Buchu-Cornsilk-Ch Grass-Hydran Other (See Comments)    Gout.  . Colchicine Diarrhea    "Have diarrhea when taken for long periods of time"  . Nifedipine Other (See Comments)    "Unknown"  . Oxycodone-Acetaminophen Other (See Comments)    GI upset.  . Tramadol Other (See Comments)    "unknown"    Current antibiotics: Antibiotics Given (last 72 hours)    Date/Time Action Medication Dose Rate   01/19/15 2329 Given   efavirenz-emtricitabine-tenofovir (ATRIPLA) 600-200-300 MG per tablet 1 tablet 1 tablet    01/20/15 2134 Given   efavirenz (SUSTIVA) tablet 600 mg 600 mg    01/21/15 2140 Given   efavirenz (SUSTIVA) tablet 600 mg 600 mg    01/21/15 2141 Given   emtricitabine-tenofovir (TRUVADA) 200-300 MG per tablet 1 tablet 1 tablet  01/22/15 0052 Given   cefTRIAXone (ROCEPHIN) 2 g in dextrose 5 % 50 mL IVPB 2 g 100 mL/hr      MEDICATIONS: . allopurinol  100 mg Oral Daily  . allopurinol  300 mg Oral Daily  . atenolol  100 mg Oral Daily  . cefTRIAXone (ROCEPHIN) IVPB 2 gram/50 mL D5W (Pyxis)  2 g Intravenous Q24H  . chlorthalidone  25 mg Oral Daily  . cloNIDine  0.1 mg Oral Daily  . cyclobenzaprine  10 mg Oral Daily  . docusate sodium  100 mg Oral BID  . doxazosin  8 mg Oral QHS  . efavirenz  600 mg Oral QHS  . emtricitabine-tenofovir  1 tablet Oral Q48H  . ferrous sulfate  325 mg Oral BID  . folic acid  1 mg Oral Daily  . hydrALAZINE  50 mg Oral TID  . lisinopril  40 mg Oral Daily  . nystatin  5 mL Oral QID  . oxyCODONE-acetaminophen  1 tablet Oral BID  . pantoprazole (PROTONIX) IV  40 mg Intravenous Q12H   . sertraline  50 mg Oral Daily  . sodium chloride  3 mL Intravenous Q12H   Review of Systems - 11 systems reviewed and negative per HPI  OBJECTIVE: Temp:  [97.8 F (36.6 C)-98.4 F (36.9 C)] 97.8 F (36.6 C) (08/23 1305) Pulse Rate:  [64-81] 64 (08/23 1305) Resp:  [18-20] 20 (08/23 0531) BP: (108-140)/(59-75) 108/65 mmHg (08/23 1305) SpO2:  [100 %] 100 % (08/23 1305) Weight:  [80.287 kg (177 lb)] 80.287 kg (177 lb) (08/23 0531) Physical Exam  Constitutional: He is oriented to person, place, and time. Slowed mentation. He appears well-developed and well-nourished. No distress.  HENT: Mouth/Throat: Oropharynx is clear and moist. No oropharyngeal exudate. No teeth, has rough irregular grooves on tongue Cardiovascular: Normal rate, regular rhythm and normal heart sounds2/6 SM Pulmonary/Chest: Effort normal and breath sounds normal. No respiratory distress. He has no wheezes.  Abdominal: Soft. Bowel sounds are normal. He exhibits no distension. There is no tenderness.  Lymphadenopathy: He has no cervical adenopathy.  Neurological: He is alert and oriented to person, place, and time. Slowed mentation Skin: Skin is warm and dry. No rash noted. No erythema.  Psychiatric: He has a normal mood and affect. His behavior is normal.   LABS: Results for orders placed or performed during the hospital encounter of 01/18/15 (from the past 48 hour(s))  CBC with Differential/Platelet     Status: Abnormal   Collection Time: 01/21/15  4:33 AM  Result Value Ref Range   WBC 5.4 3.8 - 10.6 K/uL   RBC 3.29 (L) 4.40 - 5.90 MIL/uL   Hemoglobin 8.6 (L) 13.0 - 18.0 g/dL   HCT 27.1 (L) 40.0 - 52.0 %   MCV 82.4 80.0 - 100.0 fL   MCH 26.3 26.0 - 34.0 pg   MCHC 31.9 (L) 32.0 - 36.0 g/dL   RDW 17.6 (H) 11.5 - 14.5 %   Platelets 169 150 - 440 K/uL   Neutrophils Relative % 69 %   Neutro Abs 3.7 1.4 - 6.5 K/uL   Lymphocytes Relative 20 %   Lymphs Abs 1.1 1.0 - 3.6 K/uL   Monocytes Relative 10 %   Monocytes  Absolute 0.5 0.2 - 1.0 K/uL   Eosinophils Relative 1 %   Eosinophils Absolute 0.0 0 - 0.7 K/uL   Basophils Relative 0 %   Basophils Absolute 0.0 0 - 0.1 K/uL  Comprehensive metabolic panel     Status: Abnormal  Collection Time: 01/21/15  4:33 AM  Result Value Ref Range   Sodium 141 135 - 145 mmol/L   Potassium 4.0 3.5 - 5.1 mmol/L   Chloride 109 101 - 111 mmol/L   CO2 26 22 - 32 mmol/L   Glucose, Bld 101 (H) 65 - 99 mg/dL   BUN 19 6 - 20 mg/dL   Creatinine, Ser 2.16 (H) 0.61 - 1.24 mg/dL   Calcium 7.8 (L) 8.9 - 10.3 mg/dL   Total Protein 5.9 (L) 6.5 - 8.1 g/dL   Albumin 2.8 (L) 3.5 - 5.0 g/dL   AST 32 15 - 41 U/L   ALT 30 17 - 63 U/L   Alkaline Phosphatase 71 38 - 126 U/L   Total Bilirubin <0.1 (L) 0.3 - 1.2 mg/dL   GFR calc non Af Amer 31 (L) >60 mL/min   GFR calc Af Amer 36 (L) >60 mL/min    Comment: (NOTE) The eGFR has been calculated using the CKD EPI equation. This calculation has not been validated in all clinical situations. eGFR's persistently <60 mL/min signify possible Chronic Kidney Disease.    Anion gap 6 5 - 15  Urinalysis complete, with microscopic ([facility] only)     Status: Abnormal   Collection Time: 01/21/15  9:49 PM  Result Value Ref Range   Color, Urine YELLOW (A) YELLOW   APPearance CLEAR (A) CLEAR   Glucose, UA NEGATIVE NEGATIVE mg/dL   Bilirubin Urine NEGATIVE NEGATIVE   Ketones, ur NEGATIVE NEGATIVE mg/dL   Specific Gravity, Urine 1.013 1.005 - 1.030   Hgb urine dipstick NEGATIVE NEGATIVE   pH 6.0 5.0 - 8.0   Protein, ur NEGATIVE NEGATIVE mg/dL   Nitrite POSITIVE (A) NEGATIVE   Leukocytes, UA TRACE (A) NEGATIVE   RBC / HPF 0-5 0 - 5 RBC/hpf   WBC, UA 6-30 0 - 5 WBC/hpf   Bacteria, UA MANY (A) NONE SEEN   Squamous Epithelial / LPF 0-5 (A) NONE SEEN   Mucous PRESENT   CBC with Differential/Platelet     Status: Abnormal   Collection Time: 01/22/15  4:25 AM  Result Value Ref Range   WBC 5.1 3.8 - 10.6 K/uL   RBC 3.38 (L) 4.40 - 5.90 MIL/uL    Hemoglobin 9.1 (L) 13.0 - 18.0 g/dL   HCT 28.1 (L) 40.0 - 52.0 %   MCV 83.2 80.0 - 100.0 fL   MCH 27.0 26.0 - 34.0 pg   MCHC 32.4 32.0 - 36.0 g/dL   RDW 18.0 (H) 11.5 - 14.5 %   Platelets 170 150 - 440 K/uL   Neutrophils Relative % 68 %   Neutro Abs 3.5 1.4 - 6.5 K/uL   Lymphocytes Relative 22 %   Lymphs Abs 1.1 1.0 - 3.6 K/uL   Monocytes Relative 9 %   Monocytes Absolute 0.5 0.2 - 1.0 K/uL   Eosinophils Relative 1 %   Eosinophils Absolute 0.0 0 - 0.7 K/uL   Basophils Relative 0 %   Basophils Absolute 0.0 0 - 0.1 K/uL  Basic metabolic panel     Status: Abnormal   Collection Time: 01/22/15  4:25 AM  Result Value Ref Range   Sodium 141 135 - 145 mmol/L   Potassium 3.5 3.5 - 5.1 mmol/L   Chloride 111 101 - 111 mmol/L   CO2 24 22 - 32 mmol/L   Glucose, Bld 99 65 - 99 mg/dL   BUN 17 6 - 20 mg/dL   Creatinine, Ser 1.87 (H) 0.61 - 1.24 mg/dL  Calcium 7.7 (L) 8.9 - 10.3 mg/dL   GFR calc non Af Amer 37 (L) >60 mL/min   GFR calc Af Amer 42 (L) >60 mL/min    Comment: (NOTE) The eGFR has been calculated using the CKD EPI equation. This calculation has not been validated in all clinical situations. eGFR's persistently <60 mL/min signify possible Chronic Kidney Disease.    Anion gap 6 5 - 15   No components found for: ESR, C REACTIVE PROTEIN MICRO: No results found for this or any previous visit (from the past 720 hour(s)).  IMAGING: Dg Chest 2 View  01/18/2015   CLINICAL DATA:  Chest pain  EXAM: CHEST  2 VIEW  COMPARISON:  11/12/2011  FINDINGS: Normal heart size and mediastinal contours. No acute infiltrate or edema. No effusion or pneumothorax. No acute osseous findings. Cholecystectomy clips.  IMPRESSION: No active cardiopulmonary disease.   Electronically Signed   By: Monte Fantasia M.D.   On: 01/18/2015 22:23    Assessment:   Jared Walker is a 64 y.o. male well controlled HIV (last CD4 514 count and VL undetectable in March 2016) on Atripla as well as untreated HCV  admitted with severe CP with swallowing. He also has Pica and eats a good deal of paper - increasingly so over several years.  His wife is also concerned for his memory and behaviour He underwent EGD with non bleeding esophageal ulcers found- was biopsied. No evidence thrush. He also has a progressive anemia with hgb 11 in march down to 8. Ferritin is quite low at 8.  I suspect the ulcers are due to the pica and paper consumption as opposed to an infection. CMV or HSV ulceration would be very unusual in patient with CD5 > 500 for several years and controlled viral load.    Recommendations Continue atripla Recheck cd4 and vl Hold on treatment with other antivirals pending bxp results Consider neuro eval as otpt for progressive dementia per wife  Thank you very much for allowing me to participate in the care of this patient. Please call with questions.   Cheral Marker. Ola Spurr, MD

## 2015-01-22 NOTE — Plan of Care (Addendum)
Problem: Discharge Progression Outcomes Goal: Other Discharge Outcomes/Goals Pain:  Patient without complaints of pain this shift. Hemodynamically:  Patient afebrile and VSS this shift. BP 140/75 mmHg  Pulse 72  Temp(Src) 98.1 F (36.7 C) (Oral)  Resp 18  Ht 5\' 11"  (1.803 m)  Wt 178 lb (80.74 kg)  BMI 24.84 kg/m2  SpO2 123XX123 Complications:  No signs of bleeding this shift.  Psych consult pending for PICA - patient eats paper.  EDG on 8/22 showed gastric ulcers.  Possible colonoscopy today. Diet:  Patient on DY1 diet. Activity:  Patient on high fall risk due to several falls at home.  He refuses bed alarm.  Wife at bedside.  Patient and family educated on need for bed alarm.

## 2015-01-23 ENCOUNTER — Encounter: Payer: Self-pay | Admitting: Unknown Physician Specialty

## 2015-01-23 LAB — CBC WITH DIFFERENTIAL/PLATELET
BASOS PCT: 0 %
Basophils Absolute: 0 10*3/uL (ref 0–0.1)
EOS PCT: 1 %
Eosinophils Absolute: 0 10*3/uL (ref 0–0.7)
HEMATOCRIT: 27.8 % — AB (ref 40.0–52.0)
Hemoglobin: 9 g/dL — ABNORMAL LOW (ref 13.0–18.0)
Lymphocytes Relative: 23 %
Lymphs Abs: 1 10*3/uL (ref 1.0–3.6)
MCH: 26.9 pg (ref 26.0–34.0)
MCHC: 32.3 g/dL (ref 32.0–36.0)
MCV: 83.4 fL (ref 80.0–100.0)
MONO ABS: 0.4 10*3/uL (ref 0.2–1.0)
MONOS PCT: 8 %
NEUTROS ABS: 3 10*3/uL (ref 1.4–6.5)
Neutrophils Relative %: 68 %
PLATELETS: 168 10*3/uL (ref 150–440)
RBC: 3.33 MIL/uL — ABNORMAL LOW (ref 4.40–5.90)
RDW: 18.7 % — AB (ref 11.5–14.5)
WBC: 4.4 10*3/uL (ref 3.8–10.6)

## 2015-01-23 LAB — BASIC METABOLIC PANEL
Anion gap: 4 — ABNORMAL LOW (ref 5–15)
BUN: 15 mg/dL (ref 6–20)
CALCIUM: 8 mg/dL — AB (ref 8.9–10.3)
CO2: 23 mmol/L (ref 22–32)
CREATININE: 1.68 mg/dL — AB (ref 0.61–1.24)
Chloride: 114 mmol/L — ABNORMAL HIGH (ref 101–111)
GFR, EST AFRICAN AMERICAN: 48 mL/min — AB (ref >60)
GFR, EST NON AFRICAN AMERICAN: 42 mL/min — AB (ref >60)
GLUCOSE: 108 mg/dL — AB (ref 65–99)
Potassium: 3.6 mmol/L (ref 3.5–5.1)
Sodium: 141 mmol/L (ref 135–145)

## 2015-01-23 MED ORDER — ESOMEPRAZOLE MAGNESIUM 40 MG PO CPDR: 40.0000 mg | DELAYED_RELEASE_CAPSULE | Freq: Two times a day (BID) | ORAL | Status: DC

## 2015-01-23 MED ORDER — NYSTATIN 100000 UNIT/ML MT SUSP: 5.0000 mL | Freq: Four times a day (QID) | OROMUCOSAL | Status: DC

## 2015-01-23 NOTE — Care Management (Signed)
Plan for discharge today, home with wife.  No home needs identified.   RNCM signing off

## 2015-01-23 NOTE — Progress Notes (Signed)
Discharge instructions given and went over with patient and wife at bedside. All questions answered. Patient discharged home with wife via wheelchair by nursing staff. Madlyn Frankel, RN

## 2015-01-23 NOTE — Discharge Summary (Signed)
Physician Discharge Summary  Jared Walker [id number] DOB: September 26, 1950 DOA: 01/18/2015  PCP: Rozanna Box, MD  Admit date: 01/18/2015 Discharge date: 01/23/2015  Time spent: 35 minutes  Recommendations for Outpatient Follow-up:  1. D/c Home today. 2. Call office to make appt in 1-2 weeks  3. Follow up with Dr.Eliott - GI 4. Follow up with Psychiatrist  as out pt   Discharge Diagnoses:    1 Chest pain with Dysphagia and Anemia with  heme positive stools secondary to Linear ulcers in esophagus - s/p transfusion of 2 units PRBC  2 Pica in adults  3 CKD (chronic kidney disease), stage III  4 HIV (human immunodeficiency virus infection)  5 HTN (hypertension)  6 Hep C -chronic  7 CKD (chronic kidney disease)   8  Depression    Discharge Condition: Stable  Diet recommendation: Soft   Filed Weights   01/21/15 1001 01/22/15 0531 01/23/15 0535  Weight: 80.74 kg (178 lb) 80.287 kg (177 lb) 80.831 kg (178 lb 3.2 oz)    History of present illness:  Jared Walker is a 64 year old male with a known history of HIV hypertension stage IIIc kidney hepatitis C depression, PICA-admitted with the chest pain that got worse after he attempted drink diet soda patient also experienced associated nausea, and shortness of breath  with diaphoresis  Hospital Course:  Patient was admitted [facility] received a GI cocktail and and also IV Protonix. Subsequently gastroenterology was consulted He received 2 units of packed red blood cell transfusion Patient underwent an EGD by Dr. Vira Agar and showed evidence of very long and deep linear ulceration of the esophagus. Cultures were done results are still pending. Patient was also seen by infectious disease specialist Dr. Caryn Section who felt that the ulcerations were more likely secondary to the PICA.. It was felt that the ulcerations were not likely due to CMV and the recommended wait for final cultures to come back He was also  seen in consultation with psychiatrist  and was restarted on Sertraline Family also expressed concern about his memory advised that he would need to see a neurologist for this as an outpatient. He was also advised to wean off his Percocet on a gradual basis as an outpatient Patient was also advised follow-up with Dr.Adimora  infectious disease specialist at [facility] Patient was discharged home in stable condition His hemoglobin on discharge was 9.0 Patient will follow me Dr. Ginette Pitman. He has been advised call the office with any questions or concerns  Procedures:  EGD  Consultations:  Dr. Aleen Sells- GI  Dr. Ola Spurr- ID.  Dr. Idalia Needle- Psychiatrist  Discharge Exam: Filed Vitals:   01/23/15 0535  BP: 121/68  Pulse: 66  Temp: 98.4 F (36.9 C)  Resp:     General: Not in distress Cardiovascular: S1 S2 Respiratory: Clear  Abdomen: Non tender   Discharge Instructions   Discharge Instructions    Diet - low sodium heart healthy    Complete by:  As directed      Discharge instructions    Complete by:  As directed   D/c Home today. Call office to make appt in 1-2 weeks          Current Discharge Medication List    START taking these medications   Details  nystatin (MYCOSTATIN) 100000 UNIT/ML suspension Take 5 mLs (500,000 Units total) by mouth 4 (four) times daily. Qty: 60 mL, Refills: 0      CONTINUE these medications which have CHANGED  Details  esomeprazole (NEXIUM) 40 MG capsule Take 1 capsule (40 mg total) by mouth 2 (two) times daily before a meal. AM Qty: 60 capsule, Refills: 3      CONTINUE these medications which have NOT CHANGED   Details  allopurinol (ZYLOPRIM) 300 MG tablet Take 300 mg by mouth daily. Take with (2) 100 mg tabs. Total 500 mg.  PM    atenolol (TENORMIN) 100 MG tablet Take 100 mg by mouth daily. PM    chlorthalidone (HYGROTON) 25 MG tablet Take 25 mg by mouth daily. 1/2 tab every morning    cloNIDine (CATAPRES) 0.1 MG tablet Take 0.1  mg by mouth daily.    colchicine 0.6 MG tablet Take 0.6 mg by mouth as needed.    cyclobenzaprine (FLEXERIL) 10 MG tablet Take 10 mg by mouth daily.    diazepam (VALIUM) 5 MG tablet Take 5 mg by mouth at bedtime as needed. For sleep    docusate sodium (COLACE) 100 MG capsule Take 100 mg by mouth 2 (two) times daily.    doxazosin (CARDURA) 4 MG tablet Take 8 mg by mouth at bedtime.    efavirenz-emtrictabine-tenofovir (ATRIPLA) 600-200-300 MG per tablet Take 1 tablet by mouth at bedtime.    ferrous sulfate 325 (65 FE) MG tablet Take 325 mg by mouth 2 (two) times daily.    folic acid (FOLVITE) 1 MG tablet Take 1 mg by mouth daily. PM    hydrALAZINE (APRESOLINE) 50 MG tablet Take 50 mg by mouth 3 (three) times daily.    lisinopril (PRINIVIL,ZESTRIL) 40 MG tablet Take 40 mg by mouth daily. PM    sertraline (ZOLOFT) 50 MG tablet Take 50 mg by mouth daily. PM    oxyCODONE-acetaminophen (PERCOCET) 5-325 MG per tablet Take 1 tablet by mouth 2 (two) times daily.      STOP taking these medications     HYDROcodone-acetaminophen (NORCO/VICODIN) 5-325 MG per tablet      lidocaine (XYLOCAINE) 5 % ointment      naproxen (NAPROSYN) 500 MG tablet      naproxen sodium (ANAPROX) 220 MG tablet      HYDROcodone-acetaminophen (NORCO) 5-325 MG per tablet      HYDROcodone-acetaminophen (VICODIN) 5-500 MG per tablet      oxyCODONE (OXY IR/ROXICODONE) 5 MG immediate release tablet        Allergies  Allergen Reactions  . Buchu-Cornsilk-Ch Grass-Hydran Other (See Comments)    Gout.  . Colchicine Diarrhea    "Have diarrhea when taken for long periods of time"  . Nifedipine Other (See Comments)    "Unknown"  . Oxycodone-Acetaminophen Other (See Comments)    GI upset.  . Tramadol Other (See Comments)    "unknown"      The results of significant diagnostics from this hospitalization (including imaging, microbiology, ancillary and laboratory) are listed below for reference.     Significant Diagnostic Studies: Dg Chest 2 View  01/18/2015   CLINICAL DATA:  Chest pain  EXAM: CHEST  2 VIEW  COMPARISON:  11/12/2011  FINDINGS: Normal heart size and mediastinal contours. No acute infiltrate or edema. No effusion or pneumothorax. No acute osseous findings. Cholecystectomy clips.  IMPRESSION: No active cardiopulmonary disease.   Electronically Signed   By: Monte Fantasia M.D.   On: 01/18/2015 22:23    Microbiology: No results found for this or any previous visit (from the past 240 hour(s)).   Labs: Basic Metabolic Panel:  Recent Labs Lab 01/19/15 0342 01/20/15 0449 01/21/15 0433 01/22/15 0425 01/23/15  0521  NA 141 140 141 141 141  K 3.7 4.3 4.0 3.5 3.6  CL 112* 108 109 111 114*  CO2 25 27 26 24 23   GLUCOSE 104* 99 101* 99 108*  BUN 20 22* 19 17 15   CREATININE 2.00* 2.49* 2.16* 1.87* 1.68*  CALCIUM 8.5* 8.3* 7.8* 7.7* 8.0*   Liver Function Tests:  Recent Labs Lab 01/19/15 0021 01/21/15 0433  AST 51* 32  ALT 42 30  ALKPHOS 82 71  BILITOT 0.1* <0.1*  PROT 7.2 5.9*  ALBUMIN 3.7 2.8*    Recent Labs Lab 01/19/15 0021  LIPASE 110*   No results for input(s): AMMONIA in the last 168 hours. CBC:  Recent Labs Lab 01/19/15 1452 01/20/15 0449 01/21/15 0433 01/22/15 0425 01/23/15 0521  WBC 6.2 6.3 5.4 5.1 4.4  NEUTROABS  --   --  3.7 3.5 3.0  HGB 6.9* 9.2* 8.6* 9.1* 9.0*  HCT 22.4* 29.4* 27.1* 28.1* 27.8*  MCV 81.9 83.3 82.4 83.2 83.4  PLT 185 187 169 170 168   Cardiac Enzymes:  Recent Labs Lab 01/18/15 2209  TROPONINI <0.03   BNP: BNP (last 3 results) No results for input(s): BNP in the last 8760 hours.  ProBNP (last 3 results) No results for input(s): PROBNP in the last 8760 hours.  CBG: No results for input(s): GLUCAP in the last 168 hours.     SignedTracie Harrier   01/23/2015, 12:41 PM

## 2015-01-23 NOTE — Care Management Important Message (Signed)
Important Message  Patient Details  Name: MARLEY GREULICH MRN: [id number] Date of Birth: 1950/06/13   Medicare Important Message Given:  Yes-third notification given    [name] 01/23/2015, 9:42 AM

## 2015-01-23 NOTE — Plan of Care (Signed)
Problem: Discharge Progression Outcomes Goal: Other Discharge Outcomes/Goals Individualization of Care Pt prefers to be called Jared Walker Hx of gout, HTN, heart murmur, CKD, HIV, hepatitis C, anemia, depression  Pain: Patient without complaints of pain this shift. Hemodynamically: Patient afebrile and VSS this shift. BP 131/70 mmHg  Pulse 65  Temp(Src) 98.3 F (36.8 C) (Oral)  Resp 20  Ht 5\' 11"  (1.803 m)  Wt 177 lb (80.287 kg)  BMI 24.70 kg/m2  SpO2 123XX123 Complications: No signs of bleeding this shift. PICA - patient eats paper. EDG on 8/22 showed gastric ulcers. Patient and spouse requesting colonoscopy or endoscopy prior to discharge. Diet: Patient on DY1 diet. Activity: Patient on high fall risk due to several falls at home. He refuses bed alarm. Wife at bedside. Patient and family educated on need for bed alarm.

## 2015-01-23 NOTE — H&P (Deleted)
pl

## 2015-01-23 NOTE — Progress Notes (Signed)
Subjective: 64 year old male admitted with anemia, dysphagia and guaiac-positive stools. Pt also has PICA and wife tells me he eats paper every day  Chest pain is better.Eating better EGD showed evidence for linear deep ulcers in Esophagus . Biopsies pendinmg  Objective: Vital signs in last 24 hours: Temp:  [97.8 F (36.6 C)-98.4 F (36.9 C)] 98.4 F (36.9 C) (08/24 0535) Pulse Rate:  [64-66] 66 (08/24 0535) BP: (108-131)/(65-70) 121/68 mmHg (08/24 0535) SpO2:  [100 %] 100 % (08/24 0535) Weight:  [80.831 kg (178 lb 3.2 oz)] 80.831 kg (178 lb 3.2 oz) (08/24 0535) Weight change: 0.091 kg (3.2 oz) Last BM Date: 01/21/15  Intake/Output from previous day: 08/23 0701 - 08/24 0700 In: 480 [P.O.:480] Out: -  Intake/Output this shift:    General: alert, oriented x 3, NAD HENT: no JVD, no pallor, no icterus Chest: clear to auscultation CVS: RRR, S1S2, no tachycardia Abdomen: soft, nontender, no hepatosplenomegaly Ext: no lower leg edema.  Neuro: Normal strength and tone in bilateral upper and lower extremities.  Lab Results:  Recent Labs  01/22/15 0425 01/23/15 0521  WBC 5.1 4.4  HGB 9.1* 9.0*  HCT 28.1* 27.8*  PLT 170 168   BMET  Recent Labs  01/22/15 0425 01/23/15 0521  NA 141 141  K 3.5 3.6  CL 111 114*  CO2 24 23  GLUCOSE 99 108*  BUN 17 15  CREATININE 1.87* 1.68*  CALCIUM 7.7* 8.0*    Studies/Results: No results found.  Medications:  Scheduled Meds: . allopurinol  100 mg Oral Daily  . allopurinol  300 mg Oral Daily  . atenolol  100 mg Oral Daily  . chlorthalidone  25 mg Oral Daily  . cloNIDine  0.1 mg Oral Daily  . cyclobenzaprine  10 mg Oral Daily  . docusate sodium  100 mg Oral BID  . doxazosin  8 mg Oral QHS  . efavirenz  600 mg Oral QHS  . emtricitabine-tenofovir  1 tablet Oral Q48H  . ferrous sulfate  325 mg Oral BID  . folic acid  1 mg Oral Daily  . hydrALAZINE  50 mg Oral TID  . lisinopril  40 mg Oral Daily  . nystatin  5 mL Oral QID   . oxyCODONE-acetaminophen  1 tablet Oral BID  . pantoprazole (PROTONIX) IV  40 mg Intravenous Q12H  . sertraline  50 mg Oral Daily  . sodium chloride  3 mL Intravenous Q12H   Continuous Infusions: . sodium chloride 75 mL/hr at 01/23/15 0704   PRN Meds:.diazepam, lidocaine, ondansetron **OR** ondansetron (ZOFRAN) IV   Assessment/Plan:     1Dysphagia, anemia, acute on chronic GI bleed: Patient received 2 units of packed RBCs ov Hemoglobin is 8.6 today. EGD: Deep linear ulcers related to either CMV or trauma from eating paper On IV Protonix and IV fluids. Nystatin 5 mL four times daily. Viscous lidocaine swish and swallow for dysphagia as needed. Zofran as needed for nausea.  Pureed diet today Hgb stable at 9.0 Seen by ID - hold on antivirals until bx results   2 Stage 3 chronic kidney disease: Se Creat improved to 1.68-  3 Depression and PICA: evaluated by psychiatry,. On Sertraline 50 mg daily. 4 Possible Dementia: Will consider out pt neuro eval 5 HTN: Continue Lisinopril. 6 Chronic back pain/Hx of Back surgery; On Oxycodone as needed 7 Hx of HIV: Continue Atripia Tentattve d/c later today   LOS: 4 days   Robertson Colclough 01/23/2015, 8:23 AM

## 2015-01-24 LAB — SURGICAL PATHOLOGY

## 2015-01-24 LAB — CMV (CYTOMEGALOVIRUS) DNA ULTRAQUANT, PCR
CMV DNA QUANT: NEGATIVE [IU]/mL
Log10 CMV Qn DNA Pl: UNDETERMINED log10 IU/mL

## 2015-01-25 LAB — HIV-1 RNA QUANT-NO REFLEX-BLD
HIV 1 RNA QUANT: 180 {copies}/mL
LOG10 HIV-1 RNA: 2.255 {Log_copies}/mL

## 2015-06-02 DIAGNOSIS — I219 Acute myocardial infarction, unspecified: Secondary | ICD-10-CM

## 2015-06-02 HISTORY — DX: Acute myocardial infarction, unspecified: I21.9

## 2015-08-27 ENCOUNTER — Other Ambulatory Visit: Payer: Self-pay | Admitting: Internal Medicine

## 2015-08-27 DIAGNOSIS — D509 Iron deficiency anemia, unspecified: Secondary | ICD-10-CM | POA: Insufficient documentation

## 2015-08-28 ENCOUNTER — Other Ambulatory Visit: Payer: Self-pay | Admitting: *Deleted

## 2015-08-28 ENCOUNTER — Other Ambulatory Visit: Payer: Self-pay | Admitting: Internal Medicine

## 2015-08-28 ENCOUNTER — Inpatient Hospital Stay: Payer: Medicare Other

## 2015-08-28 ENCOUNTER — Inpatient Hospital Stay: Payer: Medicare Other | Attending: Internal Medicine | Admitting: Internal Medicine

## 2015-08-28 VITALS — BP 157/78 | HR 70 | Temp 98.3°F | Resp 18 | Wt 193.8 lb

## 2015-08-28 VITALS — BP 158/82 | HR 85 | Resp 18

## 2015-08-28 DIAGNOSIS — R319 Hematuria, unspecified: Secondary | ICD-10-CM | POA: Diagnosis not present

## 2015-08-28 DIAGNOSIS — N189 Chronic kidney disease, unspecified: Secondary | ICD-10-CM | POA: Diagnosis not present

## 2015-08-28 DIAGNOSIS — F329 Major depressive disorder, single episode, unspecified: Secondary | ICD-10-CM | POA: Diagnosis not present

## 2015-08-28 DIAGNOSIS — Z79899 Other long term (current) drug therapy: Secondary | ICD-10-CM | POA: Diagnosis not present

## 2015-08-28 DIAGNOSIS — R0602 Shortness of breath: Secondary | ICD-10-CM

## 2015-08-28 DIAGNOSIS — I129 Hypertensive chronic kidney disease with stage 1 through stage 4 chronic kidney disease, or unspecified chronic kidney disease: Secondary | ICD-10-CM | POA: Diagnosis not present

## 2015-08-28 DIAGNOSIS — Z87891 Personal history of nicotine dependence: Secondary | ICD-10-CM | POA: Diagnosis not present

## 2015-08-28 DIAGNOSIS — Z8711 Personal history of peptic ulcer disease: Secondary | ICD-10-CM | POA: Insufficient documentation

## 2015-08-28 DIAGNOSIS — B1920 Unspecified viral hepatitis C without hepatic coma: Secondary | ICD-10-CM | POA: Diagnosis not present

## 2015-08-28 DIAGNOSIS — D509 Iron deficiency anemia, unspecified: Secondary | ICD-10-CM

## 2015-08-28 DIAGNOSIS — R011 Cardiac murmur, unspecified: Secondary | ICD-10-CM

## 2015-08-28 DIAGNOSIS — B20 Human immunodeficiency virus [HIV] disease: Secondary | ICD-10-CM

## 2015-08-28 DIAGNOSIS — D500 Iron deficiency anemia secondary to blood loss (chronic): Secondary | ICD-10-CM

## 2015-08-28 LAB — COMPREHENSIVE METABOLIC PANEL
ALBUMIN: 3.4 g/dL — AB (ref 3.5–5.0)
ALK PHOS: 79 U/L (ref 38–126)
ALT: 53 U/L (ref 17–63)
AST: 59 U/L — AB (ref 15–41)
Anion gap: 4 — ABNORMAL LOW (ref 5–15)
BILIRUBIN TOTAL: 0.4 mg/dL (ref 0.3–1.2)
BUN: 15 mg/dL (ref 6–20)
CALCIUM: 8.2 mg/dL — AB (ref 8.9–10.3)
CO2: 24 mmol/L (ref 22–32)
Chloride: 110 mmol/L (ref 101–111)
Creatinine, Ser: 1.82 mg/dL — ABNORMAL HIGH (ref 0.61–1.24)
GFR calc Af Amer: 44 mL/min — ABNORMAL LOW (ref >60)
GFR calc non Af Amer: 38 mL/min — ABNORMAL LOW (ref >60)
GLUCOSE: 113 mg/dL — AB (ref 65–99)
Potassium: 3.6 mmol/L (ref 3.5–5.1)
Sodium: 138 mmol/L (ref 135–145)
TOTAL PROTEIN: 6.9 g/dL (ref 6.5–8.1)

## 2015-08-28 LAB — CBC WITH DIFFERENTIAL/PLATELET
BASOS ABS: 0 10*3/uL (ref 0–0.1)
BASOS PCT: 0 %
EOS PCT: 1 %
Eosinophils Absolute: 0.1 10*3/uL (ref 0–0.7)
HCT: 21.6 % — ABNORMAL LOW (ref 40.0–52.0)
HEMOGLOBIN: 6.2 g/dL — AB (ref 13.0–18.0)
LYMPHS PCT: 23 %
Lymphs Abs: 1.3 10*3/uL (ref 1.0–3.6)
MCH: 18.5 pg — AB (ref 26.0–34.0)
MCHC: 28.9 g/dL — AB (ref 32.0–36.0)
MCV: 64.1 fL — ABNORMAL LOW (ref 80.0–100.0)
MONO ABS: 0.2 10*3/uL (ref 0.2–1.0)
Monocytes Relative: 4 %
Neutro Abs: 4.2 10*3/uL (ref 1.4–6.5)
Neutrophils Relative %: 72 %
Platelets: 202 10*3/uL (ref 150–440)
RBC: 3.36 MIL/uL — ABNORMAL LOW (ref 4.40–5.90)
RDW: 20.6 % — AB (ref 11.5–14.5)
WBC: 5.8 10*3/uL (ref 3.8–10.6)

## 2015-08-28 LAB — FERRITIN: Ferritin: 6 ng/mL — ABNORMAL LOW (ref 24–336)

## 2015-08-28 LAB — LACTATE DEHYDROGENASE: LDH: 183 U/L (ref 98–192)

## 2015-08-28 LAB — SAMPLE TO BLOOD BANK

## 2015-08-28 LAB — PREPARE RBC (CROSSMATCH)

## 2015-08-28 MED ORDER — SODIUM CHLORIDE 0.9 % IV SOLN
510.0000 mg | Freq: Once | INTRAVENOUS | Status: AC
  Administered 2015-08-28: 510 mg via INTRAVENOUS
  Filled 2015-08-28: qty 17

## 2015-08-28 MED ORDER — SODIUM CHLORIDE 0.9 % IV SOLN
Freq: Once | INTRAVENOUS | Status: AC
  Administered 2015-08-28: 15:00:00 via INTRAVENOUS
  Filled 2015-08-28: qty 1000

## 2015-08-28 NOTE — Progress Notes (Signed)
Patient here today as new evaluation regarding anemia.  Referred by Dr. Ginette Pitman.

## 2015-08-28 NOTE — Progress Notes (Signed)
[facility] NOTE  Patient Care Team: [place], MD as PCP - General (Orthopedic Surgery)  CHIEF COMPLAINTS/PURPOSE OF CONSULTATION:   # SEVERE IRON DEFICIENCY ANEMIA- [EGD Aug 2016-esophageal ulcer; colonoscopy-? 2001]   # HIV- Dr.Idarmore; [facility]/Hepatitis C [no treatment]  # CKD creat- 1.6- 2.0   HISTORY OF PRESENTING ILLNESS:  Jared Walker 65 y.o.  male history of chronic kidney disease/ and previous history of anemia- noted to have severe anemia of hemoglobin 6 with MCV of 68 at his PCPs office. He has been referred to [place] for further evaluation.   Patient admits to mild shortness of breath on exertion. Denies any nausea vomiting or weight loss. Denies any chest pain. He denies any blood in stools black red stools. Denies obvious hematuria. Mild intermittent blood in urine. He complains of significant PICA.    ROS: A complete 10 point review of system is done which is negative except mentioned above in history of present illness  MEDICAL HISTORY:  Past Medical History  Diagnosis Date  . Gout   . Hypertension   . Heart murmur   . Aortic stenosis, mild     mild AS by echo, 08/2011  . Chronic kidney disease     kidney stones  . HIV positive 09/25/11  . Hepatitis C 09/25/11  . Full dentures     upper and lower  . Hep C w/o coma, chronic 01/19/2015  . Anemia   . Depression     SURGICAL HISTORY: Past Surgical History  Procedure Laterality Date  . Cholecystectomy    . Closed reduction pelvic fracture    . Cardiovascular stress test      at [facility]  . Knee arthroscopy  2009    Right  . Nephrostomy      tube Left  . Lumbar laminectomy/decompression microdiscectomy  09/25/2011    Procedure: LUMBAR LAMINECTOMY/DECOMPRESSION MICRODISCECTOMY;  Surgeon: Erline Levine, MD;  Location: [place] [facility];  Service: Neurosurgery;  Laterality: Left;  Left Lumbar Four-Five Microdiskectomy  . Shoulder arthroscopy with open rotator cuff repair Right 10/12/2014   Procedure: SHOULDER ARTHROSCOP with decompression;  Surgeon: Leanor Kail, MD;  Location: [place];  Service: Orthopedics;  Laterality: Right;  . Fracture surgery    . Back surgery    . Esophagogastroduodenoscopy (egd) with propofol N/A 01/21/2015    Procedure: ESOPHAGOGASTRODUODENOSCOPY (EGD) WITH PROPOFOL;  Surgeon: Manya Silvas, MD;  Location: [place];  Service: Endoscopy;  Laterality: N/A;    SOCIAL HISTORY: Social History   Social History  . Marital Status: Married    Spouse Name: N/A  . Number of Children: N/A  . Years of Education: N/A   Occupational History  . Not on file.   Social History Main Topics  . Smoking status: Former Research scientist (life sciences)  . Smokeless tobacco: Former Systems developer    Quit date: 03/13/1972  . Alcohol Use: No  . Drug Use: No  . Sexual Activity: Not on file   Other Topics Concern  . Not on file   Social History Narrative    FAMILY HISTORY: Family History  Problem Relation Age of Onset  . Anesthesia problems Neg Hx   . Hypertension Mother   . Cancer Father   . Heart disease Sister   . Diabetes Brother     ALLERGIES:  is allergic to buchu-cornsilk-ch grass-hydran; colchicine; nifedipine; oxycodone-acetaminophen; and tramadol.  MEDICATIONS:  Current Outpatient Prescriptions  Medication Sig Dispense Refill  . allopurinol (ZYLOPRIM) 300 MG tablet Take 300 mg  by mouth daily. Take with (2) 100 mg tabs. Total 500 mg.  PM    . atenolol (TENORMIN) 100 MG tablet Take 100 mg by mouth daily. PM    . chlorthalidone (HYGROTON) 25 MG tablet Take 25 mg by mouth daily. 1/2 tab every morning    . cloNIDine (CATAPRES) 0.1 MG tablet Take 0.1 mg by mouth daily.    . colchicine 0.6 MG tablet Take 0.6 mg by mouth as needed.    . cyclobenzaprine (FLEXERIL) 10 MG tablet Take 10 mg by mouth daily.    . diazepam (VALIUM) 5 MG tablet Take 5 mg by mouth at bedtime as needed. For sleep    . docusate sodium (COLACE) 100 MG capsule Take 100 mg by mouth 2 (two)  times daily.    . dolutegravir (TIVICAY) 50 MG tablet Take 50 mg by mouth.    . doxazosin (CARDURA) 4 MG tablet Take 8 mg by mouth at bedtime.    Marland Kitchen esomeprazole (NEXIUM) 40 MG capsule Take 1 capsule (40 mg total) by mouth 2 (two) times daily before a meal. AM 60 capsule 3  . ferrous sulfate 325 (65 FE) MG tablet Take 325 mg by mouth 2 (two) times daily.    . folic acid (FOLVITE) 1 MG tablet Take 1 mg by mouth daily. PM    . hydrALAZINE (APRESOLINE) 50 MG tablet Take 50 mg by mouth 3 (three) times daily.    Marland Kitchen lisinopril (PRINIVIL,ZESTRIL) 40 MG tablet Take 40 mg by mouth daily. PM    . nystatin (MYCOSTATIN) 100000 UNIT/ML suspension Take 5 mLs (500,000 Units total) by mouth 4 (four) times daily. 60 mL 0  . oxyCODONE-acetaminophen (PERCOCET) 5-325 MG per tablet Take 1 tablet by mouth 2 (two) times daily.    . sertraline (ZOLOFT) 50 MG tablet Take 50 mg by mouth daily. PM    . DESCOVY 200-25 MG tablet Take 1 tablet by mouth daily.  3   No current facility-administered medications for this visit.      Marland Kitchen  PHYSICAL EXAMINATION:   Filed Vitals:   08/28/15 1423  BP: 157/78  Pulse: 70  Temp: 98.3 F (36.8 C)  Resp: 18   Filed Weights   08/28/15 1423  Weight: 193 lb 12.6 oz (87.9 kg)    GENERAL: Well-nourished well-developed; Alert, no distress and comfortable. Alone.  EYES: positive for pallor; no icterus OROPHARYNX: no thrush or ulceration; good dentition  NECK: supple, no masses felt LYMPH:  no palpable lymphadenopathy in the cervical, axillary or inguinal regions LUNGS: clear to auscultation and  No wheeze or crackles HEART/CVS: regular rate & rhythm and no murmurs; No lower extremity edema ABDOMEN: abdomen soft, non-tender and normal bowel sounds Musculoskeletal:no cyanosis of digits and no clubbing  PSYCH: alert & oriented x 3 with fluent speech NEURO: no focal motor/sensory deficits SKIN:  no rashes or significant lesions  LABORATORY DATA:  I have reviewed the data as  listed Lab Results  Component Value Date   WBC 4.4 01/23/2015   HGB 9.0* 01/23/2015   HCT 27.8* 01/23/2015   MCV 83.4 01/23/2015   PLT 168 01/23/2015    Recent Labs  01/19/15 0021  01/21/15 0433 01/22/15 0425 01/23/15 0521  NA  --   < > 141 141 141  K  --   < > 4.0 3.5 3.6  CL  --   < > 109 111 114*  CO2  --   < > 26 24 23   GLUCOSE  --   < >  101* 99 108*  BUN  --   < > 19 17 15   CREATININE  --   < > 2.16* 1.87* 1.68*  CALCIUM  --   < > 7.8* 7.7* 8.0*  GFRNONAA  --   < > 31* 37* 42*  GFRAA  --   < > 36* 42* 48*  PROT 7.2  --  5.9*  --   --   ALBUMIN 3.7  --  2.8*  --   --   AST 51*  --  32  --   --   ALT 42  --  30  --   --   ALKPHOS 82  --  71  --   --   BILITOT 0.1*  --  <0.1*  --   --   BILIDIR <0.1*  --   --   --   --   IBILI NOT CALCULATED  --   --   --   --   < > = values in this interval not displayed.   ASSESSMENT & PLAN:   # Severe iron deficiency anemia/hemoglobin- 6/ ferritin 5. The etiology is unclear patient had EGD- Aug 2016- esophageal ulcers;  Colonoscopy- many years ago. No obvious blood loss noted. However patient needs repeat endoscopies. I spoke to Dr. Vira Agar. He plans it for next Tuesday.  # Meanwhile we will plan IV iron Feraheme 2 Weekly starting today. We'll also transfuse 2 units of blood prior to endoscopy.  # intermittent hematuria- recommend checking urinalysis. And if positive for blood would recommend a CT scan.  # check CBC/possible ferriheme in 3 weeks.   Thank you Dr.Hande for allowing me to participate in the care of your pleasant patient. Please do not hesitate to contact me with questions or concerns in the interim.   # 45 minutes face-to-face with the patient discussing the above plan of care; more than 50% of time spent on counseling and coordination.      Cammie Sickle, MD 08/28/2015 2:34 PM

## 2015-08-28 NOTE — Progress Notes (Signed)
Blood bank orders released. Prepare RBCs for 2 units to be transfused on 08/30/15.  MD spoke with Dr. Vira Agar today. Will plan for GI scope next week to r/o upper/lower gi bleed.

## 2015-08-29 LAB — HEMOGLOBINOPATHY EVALUATION
HGB A2 QUANT: 1.5 % (ref 0.7–3.1)
HGB C: 0 %
Hgb A: 98.5 % — ABNORMAL HIGH (ref 94.0–98.0)
Hgb F Quant: 0 % (ref 0.0–2.0)
Hgb S Quant: 0 %

## 2015-08-29 LAB — HAPTOGLOBIN: HAPTOGLOBIN: 85 mg/dL (ref 34–200)

## 2015-08-30 ENCOUNTER — Inpatient Hospital Stay: Payer: Medicare Other

## 2015-08-30 DIAGNOSIS — D509 Iron deficiency anemia, unspecified: Secondary | ICD-10-CM | POA: Diagnosis not present

## 2015-08-30 DIAGNOSIS — D500 Iron deficiency anemia secondary to blood loss (chronic): Secondary | ICD-10-CM

## 2015-08-30 LAB — URINALYSIS COMPLETE WITH MICROSCOPIC (ARMC ONLY)
BILIRUBIN URINE: NEGATIVE
Bacteria, UA: NONE SEEN
Glucose, UA: NEGATIVE mg/dL
Hgb urine dipstick: NEGATIVE
KETONES UR: NEGATIVE mg/dL
Leukocytes, UA: NEGATIVE
Nitrite: NEGATIVE
Protein, ur: NEGATIVE mg/dL
RBC / HPF: NONE SEEN RBC/hpf (ref 0–5)
SQUAMOUS EPITHELIAL / LPF: NONE SEEN
Specific Gravity, Urine: 1.015 (ref 1.005–1.030)
pH: 6 (ref 5.0–8.0)

## 2015-08-30 MED ORDER — SODIUM CHLORIDE 0.9 % IV SOLN
250.0000 mL | Freq: Once | INTRAVENOUS | Status: AC
  Administered 2015-08-30: 250 mL via INTRAVENOUS
  Filled 2015-08-30: qty 250

## 2015-08-30 MED ORDER — DIPHENHYDRAMINE HCL 25 MG PO CAPS
25.0000 mg | ORAL_CAPSULE | Freq: Once | ORAL | Status: AC
  Administered 2015-08-30: 25 mg via ORAL
  Filled 2015-08-30: qty 1

## 2015-08-31 LAB — TYPE AND SCREEN
ABO/RH(D): B POS
ANTIBODY SCREEN: NEGATIVE
UNIT DIVISION: 0
Unit division: 0

## 2015-09-03 ENCOUNTER — Encounter: Payer: Self-pay | Admitting: *Deleted

## 2015-09-04 ENCOUNTER — Ambulatory Visit: Payer: Medicare Other | Admitting: Certified Registered"

## 2015-09-04 ENCOUNTER — Ambulatory Visit
Admission: RE | Admit: 2015-09-04 | Discharge: 2015-09-04 | Disposition: A | Discharge status: Home / Self Care | Payer: Medicare Other | Source: Ambulatory Visit | Attending: Unknown Physician Specialty | Admitting: Unknown Physician Specialty

## 2015-09-04 ENCOUNTER — Encounter
Admission: RE | Disposition: A | Discharge status: Home / Self Care | Payer: Self-pay | Source: Ambulatory Visit | Attending: Unknown Physician Specialty

## 2015-09-04 ENCOUNTER — Encounter: Payer: Self-pay | Admitting: Unknown Physician Specialty

## 2015-09-04 ENCOUNTER — Inpatient Hospital Stay: Payer: Medicare Other | Attending: Internal Medicine

## 2015-09-04 DIAGNOSIS — K259 Gastric ulcer, unspecified as acute or chronic, without hemorrhage or perforation: Secondary | ICD-10-CM | POA: Insufficient documentation

## 2015-09-04 DIAGNOSIS — Z885 Allergy status to narcotic agent status: Secondary | ICD-10-CM | POA: Insufficient documentation

## 2015-09-04 DIAGNOSIS — Z87891 Personal history of nicotine dependence: Secondary | ICD-10-CM | POA: Diagnosis not present

## 2015-09-04 DIAGNOSIS — F329 Major depressive disorder, single episode, unspecified: Secondary | ICD-10-CM | POA: Insufficient documentation

## 2015-09-04 DIAGNOSIS — Z9049 Acquired absence of other specified parts of digestive tract: Secondary | ICD-10-CM | POA: Diagnosis not present

## 2015-09-04 DIAGNOSIS — N189 Chronic kidney disease, unspecified: Secondary | ICD-10-CM | POA: Diagnosis not present

## 2015-09-04 DIAGNOSIS — Z79899 Other long term (current) drug therapy: Secondary | ICD-10-CM | POA: Insufficient documentation

## 2015-09-04 DIAGNOSIS — K222 Esophageal obstruction: Secondary | ICD-10-CM | POA: Diagnosis not present

## 2015-09-04 DIAGNOSIS — Z87442 Personal history of urinary calculi: Secondary | ICD-10-CM | POA: Insufficient documentation

## 2015-09-04 DIAGNOSIS — B1920 Unspecified viral hepatitis C without hepatic coma: Secondary | ICD-10-CM | POA: Insufficient documentation

## 2015-09-04 DIAGNOSIS — B20 Human immunodeficiency virus [HIV] disease: Secondary | ICD-10-CM | POA: Insufficient documentation

## 2015-09-04 DIAGNOSIS — K2950 Unspecified chronic gastritis without bleeding: Secondary | ICD-10-CM | POA: Insufficient documentation

## 2015-09-04 DIAGNOSIS — M109 Gout, unspecified: Secondary | ICD-10-CM | POA: Diagnosis not present

## 2015-09-04 DIAGNOSIS — Z888 Allergy status to other drugs, medicaments and biological substances status: Secondary | ICD-10-CM | POA: Insufficient documentation

## 2015-09-04 DIAGNOSIS — I129 Hypertensive chronic kidney disease with stage 1 through stage 4 chronic kidney disease, or unspecified chronic kidney disease: Secondary | ICD-10-CM | POA: Diagnosis not present

## 2015-09-04 DIAGNOSIS — D509 Iron deficiency anemia, unspecified: Secondary | ICD-10-CM | POA: Insufficient documentation

## 2015-09-04 DIAGNOSIS — Z886 Allergy status to analgesic agent status: Secondary | ICD-10-CM | POA: Insufficient documentation

## 2015-09-04 HISTORY — PX: ESOPHAGOGASTRODUODENOSCOPY (EGD) WITH PROPOFOL: SHX5813

## 2015-09-04 SURGERY — ESOPHAGOGASTRODUODENOSCOPY (EGD) WITH PROPOFOL
Anesthesia: General

## 2015-09-04 MED ORDER — PROPOFOL 10 MG/ML IV BOLUS
INTRAVENOUS | Status: DC | PRN
  Administered 2015-09-04: 70 mg via INTRAVENOUS

## 2015-09-04 MED ORDER — PROPOFOL 500 MG/50ML IV EMUL
INTRAVENOUS | Status: DC | PRN
  Administered 2015-09-04: 100 ug/kg/min via INTRAVENOUS

## 2015-09-04 MED ORDER — SODIUM CHLORIDE 0.9 % IV SOLN
INTRAVENOUS | Status: DC
  Administered 2015-09-04 (×2): via INTRAVENOUS

## 2015-09-04 MED ORDER — LIDOCAINE HCL (PF) 2 % IJ SOLN
INTRAMUSCULAR | Status: DC | PRN
  Administered 2015-09-04: 100 mg via INTRADERMAL

## 2015-09-04 NOTE — Op Note (Signed)
[facility] Gastroenterology Patient Name: Jared Walker Procedure Date: 09/04/2015 10:35 AM MRN: [id number] Account #: [id number] Date of Birth: February 16, 1951 Admit Type: Outpatient Age: 65 Room: [facility] ENDO ROOM 4 Gender: Male Note Status: Finalized Procedure:            Upper GI endoscopy Indications:          Unexplained iron deficiency anemia Providers:            Manya Silvas, MD Referring MD:         Tracie Harrier, MD (Referring MD) Medicines:            Propofol per Anesthesia Complications:        No immediate complications. Procedure:            Pre-Anesthesia Assessment:                       - After reviewing the risks and benefits, the patient                        was deemed in satisfactory condition to undergo the                        procedure.                       After obtaining informed consent, the endoscope was                        passed under direct vision. Throughout the procedure,                        the patient's blood pressure, pulse, and oxygen                        saturations were monitored continuously. The Endoscope                        was introduced through the mouth, and advanced to the                        second part of duodenum. The upper GI endoscopy was                        accomplished without difficulty. The patient tolerated                        the procedure well. Findings:      A mild Schatzki ring (acquired) was found at the gastroesophageal       junction. A guidewire was placed and the scope was withdrawn. Dilation       was performed with a Savary dilator with mild resistance at 15 mm and 16       mm.      Two non-bleeding superficial gastric ulcers with pigmented material were       found in the gastric antrum. The largest lesion was 4 mm in largest       dimension. Biopsies were taken with a cold forceps for histology.       Biopsies were taken with a cold forceps for Helicobacter pylori  testing.      Patchy moderate inflammation characterized by erythema and  granularity       was found in the gastric antrum. Biopsies were taken with a cold forceps       for histology.      The examined duodenum was normal. Impression:           - Mild Schatzki ring. Dilated.                       - Non-bleeding gastric ulcers with pigmented material.                        Biopsied.                       - Gastritis. Biopsied.                       - Normal examined duodenum. Recommendation:       - Await pathology results. Manya Silvas, MD 09/04/2015 10:57:35 AM This report has been signed electronically. Number of Addenda: 0 Note Initiated On: 09/04/2015 10:35 AM      [facility]

## 2015-09-04 NOTE — Anesthesia Preprocedure Evaluation (Signed)
Anesthesia Evaluation  Patient identified by MRN, date of birth, ID band Patient awake    Reviewed: Allergy & Precautions, NPO status , Patient's Chart, lab work & pertinent test results  Airway Mallampati: II       Dental  (+) Upper Dentures, Lower Dentures   Pulmonary former smoker,    breath sounds clear to auscultation       Cardiovascular Exercise Tolerance: Good hypertension, Pt. on medications  Rhythm:Regular Rate:Normal     Neuro/Psych Depression    GI/Hepatic negative GI ROS, (+) Hepatitis -, C  Endo/Other    Renal/GU      Musculoskeletal   Abdominal Normal abdominal exam  (+)   Peds  Hematology  (+) anemia , HIV,   Anesthesia Other Findings   Reproductive/Obstetrics                             Anesthesia Physical Anesthesia Plan  ASA: III  Anesthesia Plan: General   Post-op Pain Management:    Induction: Intravenous  Airway Management Planned: Natural Airway and Nasal Cannula  Additional Equipment:   Intra-op Plan:   Post-operative Plan:   Informed Consent: I have reviewed the patients History and Physical, chart, labs and discussed the procedure including the risks, benefits and alternatives for the proposed anesthesia with the patient or authorized representative who has indicated his/her understanding and acceptance.     Plan Discussed with: CRNA  Anesthesia Plan Comments:         Anesthesia Quick Evaluation

## 2015-09-04 NOTE — Transfer of Care (Signed)
Immediate Anesthesia Transfer of Care Note  Patient: Jared Walker  Procedure(s) Performed: Procedure(s): ESOPHAGOGASTRODUODENOSCOPY (EGD) WITH PROPOFOL (N/A)  Patient Location: PACU  Anesthesia Type:General  Level of Consciousness: responds to stimulation, sleeping  Airway & Oxygen Therapy: Patient Spontanous Breathing and Patient connected to nasal cannula oxygen  Post-op Assessment: Report given to RN and Post -op Vital signs reviewed and stable  Post vital signs: Reviewed and stable  Last Vitals:  Filed Vitals:   09/04/15 1021 09/04/15 1057  BP: 183/82 134/71  Pulse: 54 56  Temp: 37.1 C   Resp: 17 17    Complications: No apparent anesthesia complications

## 2015-09-04 NOTE — Anesthesia Postprocedure Evaluation (Signed)
Anesthesia Post Note  Patient: Jared Walker  Procedure(s) Performed: Procedure(s) (LRB): ESOPHAGOGASTRODUODENOSCOPY (EGD) WITH PROPOFOL (N/A)  Patient location during evaluation: PACU Anesthesia Type: General Level of consciousness: awake Pain management: pain level controlled Vital Signs Assessment: post-procedure vital signs reviewed and stable Respiratory status: spontaneous breathing Cardiovascular status: blood pressure returned to baseline Anesthetic complications: no    Last Vitals:  Filed Vitals:   09/04/15 1057 09/04/15 1100  BP: 134/71 134/71  Pulse: 56 55  Temp: 36.2 C   Resp: 17 16    Last Pain: There were no vitals filed for this visit.               VAN STAVEREN,Hildreth Orsak

## 2015-09-04 NOTE — H&P (Signed)
Primary Care Physician:  Tracie Harrier, MD Primary Gastroenterologist:  Dr. Vira Agar  Pre-Procedure History & Physical: HPI:  Jared Walker is a 65 y.o. male is here for an endoscopy.   Past Medical History  Diagnosis Date  . Gout   . Hypertension   . Heart murmur   . Aortic stenosis, mild     mild AS by echo, 08/2011  . Chronic kidney disease     kidney stones  . HIV positive ([place]) 09/25/11  . Hepatitis C 09/25/11  . Full dentures     upper and lower  . Hep C w/o coma, chronic ([place]) 01/19/2015  . Anemia   . Depression     Past Surgical History  Procedure Laterality Date  . Cholecystectomy    . Closed reduction pelvic fracture    . Cardiovascular stress test      at [facility]  . Knee arthroscopy  2009    Right  . Nephrostomy      tube Left  . Lumbar laminectomy/decompression microdiscectomy  09/25/2011    Procedure: LUMBAR LAMINECTOMY/DECOMPRESSION MICRODISCECTOMY;  Surgeon: Erline Levine, MD;  Location: [place] [facility];  Service: Neurosurgery;  Laterality: Left;  Left Lumbar Four-Five Microdiskectomy  . Shoulder arthroscopy with open rotator cuff repair Right 10/12/2014    Procedure: SHOULDER ARTHROSCOP with decompression;  Surgeon: Leanor Kail, MD;  Location: [place];  Service: Orthopedics;  Laterality: Right;  . Fracture surgery    . Back surgery    . Esophagogastroduodenoscopy (egd) with propofol N/A 01/21/2015    Procedure: ESOPHAGOGASTRODUODENOSCOPY (EGD) WITH PROPOFOL;  Surgeon: Manya Silvas, MD;  Location: [place];  Service: Endoscopy;  Laterality: N/A;    Prior to Admission medications   Medication Sig Start Date End Date Taking? Authorizing Provider  allopurinol (ZYLOPRIM) 300 MG tablet Take 300 mg by mouth daily. Take with (2) 100 mg tabs. Total 500 mg.  PM   Yes Historical Provider, MD  atenolol (TENORMIN) 100 MG tablet Take 100 mg by mouth daily. PM   Yes Historical Provider, MD  cloNIDine (CATAPRES) 0.1 MG tablet Take 0.1 mg by  mouth daily. 01/08/15 01/08/16 Yes Historical Provider, MD  DESCOVY 200-25 MG tablet Take 1 tablet by mouth daily. 08/12/15  Yes Historical Provider, MD  dolutegravir (TIVICAY) 50 MG tablet Take 50 mg by mouth. 07/31/15  Yes Historical Provider, MD  esomeprazole (NEXIUM) 40 MG capsule Take 1 capsule (40 mg total) by mouth 2 (two) times daily before a meal. AM 01/23/15  Yes Vishwanath Hande, MD  ferrous sulfate 325 (65 FE) MG tablet Take 325 mg by mouth 2 (two) times daily. 01/10/15 01/10/16 Yes Historical Provider, MD  lisinopril (PRINIVIL,ZESTRIL) 40 MG tablet Take 40 mg by mouth daily. PM   Yes Historical Provider, MD  sertraline (ZOLOFT) 50 MG tablet Take 50 mg by mouth daily. PM   Yes Historical Provider, MD  chlorthalidone (HYGROTON) 25 MG tablet Take 25 mg by mouth daily. Reported on 09/04/2015    Historical Provider, MD  colchicine 0.6 MG tablet Take 0.6 mg by mouth as needed.    Historical Provider, MD  cyclobenzaprine (FLEXERIL) 10 MG tablet Take 10 mg by mouth daily. Reported on 09/04/2015    Historical Provider, MD  diazepam (VALIUM) 5 MG tablet Take 5 mg by mouth at bedtime as needed. For sleep    Historical Provider, MD  docusate sodium (COLACE) 100 MG capsule Take 100 mg by mouth 2 (two) times daily. 05/24/14   Historical Provider, MD  doxazosin (CARDURA) 4 MG tablet Take 8 mg by mouth at bedtime. Reported on 09/04/2015    Historical Provider, MD  folic acid (FOLVITE) 1 MG tablet Take 1 mg by mouth daily. PM    Historical Provider, MD  hydrALAZINE (APRESOLINE) 50 MG tablet Take 50 mg by mouth 3 (three) times daily. Reported on 09/04/2015    Historical Provider, MD  nystatin (MYCOSTATIN) 100000 UNIT/ML suspension Take 5 mLs (500,000 Units total) by mouth 4 (four) times daily. 01/23/15   Tracie Harrier, MD  oxyCODONE-acetaminophen (PERCOCET) 5-325 MG per tablet Take 1 tablet by mouth 2 (two) times daily.    Historical Provider, MD    Allergies as of 09/03/2015 - Review Complete 09/03/2015  Allergen  Reaction Noted  . Buchu-cornsilk-ch grass-hydran Other (See Comments) 01/18/2015  . Colchicine Diarrhea 01/18/2015  . Nifedipine Other (See Comments) 01/18/2015  . Oxycodone-acetaminophen Other (See Comments) 01/18/2015  . Tramadol Other (See Comments) 01/18/2015    Family History  Problem Relation Age of Onset  . Anesthesia problems Neg Hx   . Hypertension Mother   . Cancer Father   . Heart disease Sister   . Diabetes Brother     Social History   Social History  . Marital Status: Married    Spouse Name: N/A  . Number of Children: N/A  . Years of Education: N/A   Occupational History  . Not on file.   Social History Main Topics  . Smoking status: Former Research scientist (life sciences)  . Smokeless tobacco: Former Systems developer    Quit date: 03/13/1972  . Alcohol Use: No  . Drug Use: No  . Sexual Activity: Not on file   Other Topics Concern  . Not on file   Social History Narrative    Review of Systems: See HPI, otherwise negative ROS  Physical Exam: BP 183/82 mmHg  Pulse 54  Temp(Src) 98.7 F (37.1 C) (Oral)  Resp 17  Ht 5\' 11"  (1.803 m)  Wt 88.905 kg (196 lb)  BMI 27.35 kg/m2  SpO2 100% General:   Alert,  pleasant and cooperative in NAD Head:  Normocephalic and atraumatic. Neck:  Supple; no masses or thyromegaly. Lungs:  Clear throughout to auscultation.    Heart:  Regular rate and rhythm. Abdomen:  Soft, nontender and nondistended. Normal bowel sounds, without guarding, and without rebound.   Neurologic:  Alert and  oriented x4;  grossly normal neurologically.  Impression/Plan: [name] is here for an endoscopy to be performed for iron def anemia  Risks, benefits, limitations, and alternatives regarding  endoscopy have been reviewed with the patient.  Questions have been answered.  All parties agreeable.   Gaylyn Cheers, MD  09/04/2015, 10:36 AM

## 2015-09-06 LAB — SURGICAL PATHOLOGY

## 2015-09-18 ENCOUNTER — Inpatient Hospital Stay: Payer: Medicare Other

## 2015-09-18 ENCOUNTER — Inpatient Hospital Stay: Payer: Medicare Other | Admitting: Internal Medicine

## 2015-10-04 ENCOUNTER — Inpatient Hospital Stay: Payer: Medicare Other | Admitting: Internal Medicine

## 2015-10-04 ENCOUNTER — Inpatient Hospital Stay: Payer: Medicare Other

## 2015-10-11 ENCOUNTER — Inpatient Hospital Stay: Payer: Medicare Other

## 2015-10-11 ENCOUNTER — Inpatient Hospital Stay: Payer: Medicare Other | Attending: Internal Medicine

## 2015-10-11 ENCOUNTER — Inpatient Hospital Stay (HOSPITAL_BASED_OUTPATIENT_CLINIC_OR_DEPARTMENT_OTHER): Payer: Medicare Other | Admitting: Internal Medicine

## 2015-10-11 VITALS — BP 231/103 | HR 60 | Temp 96.6°F | Resp 18 | Wt 200.8 lb

## 2015-10-11 DIAGNOSIS — I129 Hypertensive chronic kidney disease with stage 1 through stage 4 chronic kidney disease, or unspecified chronic kidney disease: Secondary | ICD-10-CM | POA: Insufficient documentation

## 2015-10-11 DIAGNOSIS — Z8711 Personal history of peptic ulcer disease: Secondary | ICD-10-CM | POA: Diagnosis not present

## 2015-10-11 DIAGNOSIS — Z79899 Other long term (current) drug therapy: Secondary | ICD-10-CM | POA: Diagnosis not present

## 2015-10-11 DIAGNOSIS — I350 Nonrheumatic aortic (valve) stenosis: Secondary | ICD-10-CM | POA: Diagnosis not present

## 2015-10-11 DIAGNOSIS — Z809 Family history of malignant neoplasm, unspecified: Secondary | ICD-10-CM

## 2015-10-11 DIAGNOSIS — B182 Chronic viral hepatitis C: Secondary | ICD-10-CM | POA: Diagnosis not present

## 2015-10-11 DIAGNOSIS — Z8781 Personal history of (healed) traumatic fracture: Secondary | ICD-10-CM | POA: Insufficient documentation

## 2015-10-11 DIAGNOSIS — R011 Cardiac murmur, unspecified: Secondary | ICD-10-CM | POA: Diagnosis not present

## 2015-10-11 DIAGNOSIS — Z87442 Personal history of urinary calculi: Secondary | ICD-10-CM | POA: Diagnosis not present

## 2015-10-11 DIAGNOSIS — N189 Chronic kidney disease, unspecified: Secondary | ICD-10-CM | POA: Diagnosis not present

## 2015-10-11 DIAGNOSIS — F329 Major depressive disorder, single episode, unspecified: Secondary | ICD-10-CM | POA: Insufficient documentation

## 2015-10-11 DIAGNOSIS — Z87891 Personal history of nicotine dependence: Secondary | ICD-10-CM | POA: Diagnosis not present

## 2015-10-11 DIAGNOSIS — D509 Iron deficiency anemia, unspecified: Secondary | ICD-10-CM | POA: Diagnosis not present

## 2015-10-11 DIAGNOSIS — B20 Human immunodeficiency virus [HIV] disease: Secondary | ICD-10-CM | POA: Insufficient documentation

## 2015-10-11 DIAGNOSIS — D500 Iron deficiency anemia secondary to blood loss (chronic): Secondary | ICD-10-CM

## 2015-10-11 LAB — CBC WITH DIFFERENTIAL/PLATELET
BASOS ABS: 0 10*3/uL (ref 0–0.1)
BASOS PCT: 0 %
EOS ABS: 0.1 10*3/uL (ref 0–0.7)
Eosinophils Relative: 2 %
HEMATOCRIT: 39.9 % — AB (ref 40.0–52.0)
HEMOGLOBIN: 13 g/dL (ref 13.0–18.0)
Lymphocytes Relative: 20 %
Lymphs Abs: 1.2 10*3/uL (ref 1.0–3.6)
MCH: 26.2 pg (ref 26.0–34.0)
MCHC: 32.5 g/dL (ref 32.0–36.0)
MCV: 80.8 fL (ref 80.0–100.0)
MONOS PCT: 10 %
Monocytes Absolute: 0.6 10*3/uL (ref 0.2–1.0)
NEUTROS ABS: 4.3 10*3/uL (ref 1.4–6.5)
NEUTROS PCT: 68 %
Platelets: 123 10*3/uL — ABNORMAL LOW (ref 150–440)
RBC: 4.94 MIL/uL (ref 4.40–5.90)
RDW: 28.3 % — ABNORMAL HIGH (ref 11.5–14.5)
WBC: 6.2 10*3/uL (ref 3.8–10.6)

## 2015-10-11 LAB — SAMPLE TO BLOOD BANK

## 2015-10-11 NOTE — Progress Notes (Signed)
Jared Walker NOTE  Patient Care Team: Tracie Harrier, MD as PCP - General (Internal Medicine)  CHIEF COMPLAINTS/PURPOSE OF CONSULTATION:   # SEVERE IRON DEFICIENCY ANEMIA- [EGD Aug 2016-esophageal ulcer; colonoscopy-? 2001]; EGD- April 2017- Neg. UA- neg.   # HIV- Dr.Idarmore; [facility]/Hepatitis C [no treatment]  # CKD creat- 1.6- 2.0   HISTORY OF PRESENTING ILLNESS:  Jared Walker 65 y.o.  male history of chronic kidney disease/ and the severe iron deficiency is here for follow-up.  In the interim patient had a EGD with Dr. Vira Agar that did not reveal any obvious also bleeding.  Energy levels improved. No shortness of breath. No blood in stools. No nausea no vomiting no weight loss. No blood in urine.    ROS: A complete 10 point review of system is done which is negative except mentioned above in history of present illness  MEDICAL HISTORY:  Past Medical History  Diagnosis Date  . Gout   . Hypertension   . Heart murmur   . Aortic stenosis, mild     mild AS by echo, 08/2011  . Chronic kidney disease     kidney stones  . HIV positive ([place]) 09/25/11  . Hepatitis C 09/25/11  . Full dentures     upper and lower  . Hep C w/o coma, chronic ([place]) 01/19/2015  . Anemia   . Depression     SURGICAL HISTORY: Past Surgical History  Procedure Laterality Date  . Cholecystectomy    . Closed reduction pelvic fracture    . Cardiovascular stress test      at [facility]  . Knee arthroscopy  2009    Right  . Nephrostomy      tube Left  . Lumbar laminectomy/decompression microdiscectomy  09/25/2011    Procedure: LUMBAR LAMINECTOMY/DECOMPRESSION MICRODISCECTOMY;  Surgeon: Erline Levine, MD;  Location: [facility];  Service: Neurosurgery;  Laterality: Left;  Left Lumbar Four-Five Microdiskectomy  . Shoulder arthroscopy with open rotator cuff repair Right 10/12/2014    Procedure: SHOULDER ARTHROSCOP with decompression;  Surgeon: Leanor Kail, MD;  Location: [facility];  Service: Orthopedics;  Laterality: Right;  . Fracture surgery    . Back surgery    . Esophagogastroduodenoscopy (egd) with propofol N/A 01/21/2015    Procedure: ESOPHAGOGASTRODUODENOSCOPY (EGD) WITH PROPOFOL;  Surgeon: Manya Silvas, MD;  Location: [facility];  Service: Endoscopy;  Laterality: N/A;  . Esophagogastroduodenoscopy (egd) with propofol N/A 09/04/2015    Procedure: ESOPHAGOGASTRODUODENOSCOPY (EGD) WITH PROPOFOL;  Surgeon: Manya Silvas, MD;  Location: [facility];  Service: Endoscopy;  Laterality: N/A;    SOCIAL HISTORY: Social History   Social History  . Marital Status: Married    Spouse Name: N/A  . Number of Children: N/A  . Years of Education: N/A   Occupational History  . Not on file.   Social History Main Topics  . Smoking status: Former Research scientist (life sciences)  . Smokeless tobacco: Former Systems developer    Quit date: 03/13/1972  . Alcohol Use: No  . Drug Use: No  . Sexual Activity: Not on file   Other Topics Concern  . Not on file   Social History Narrative    FAMILY HISTORY: Family History  Problem Relation Age of Onset  . Anesthesia problems Neg Hx   . Hypertension Mother   . Cancer Father   . Heart disease Sister   . Diabetes Brother     ALLERGIES:  is allergic to buchu-cornsilk-ch grass-hydran; colchicine; nifedipine; oxycodone-acetaminophen; and tramadol.  MEDICATIONS:  Current Outpatient Prescriptions  Medication Sig Dispense Refill  . allopurinol (ZYLOPRIM) 300 MG tablet Take 300 mg by mouth daily. Take with (2) 100 mg tabs. Total 500 mg.  PM    . atenolol (TENORMIN) 100 MG tablet Take 100 mg by mouth daily. PM    . chlorthalidone (HYGROTON) 25 MG tablet Take 25 mg by mouth daily. Reported on 09/04/2015    . cloNIDine (CATAPRES) 0.1 MG tablet Take 0.1 mg by mouth daily.    . colchicine 0.6 MG tablet Take 0.6 mg by mouth as needed.    . cyclobenzaprine (FLEXERIL) 10 MG tablet Take 10 mg by mouth daily. Reported on 09/04/2015    . DESCOVY  200-25 MG tablet Take 1 tablet by mouth daily.  3  . diazepam (VALIUM) 5 MG tablet Take 5 mg by mouth at bedtime as needed. For sleep    . docusate sodium (COLACE) 100 MG capsule Take 100 mg by mouth 2 (two) times daily.    . dolutegravir (TIVICAY) 50 MG tablet Take 50 mg by mouth.    . doxazosin (CARDURA) 4 MG tablet Take 8 mg by mouth at bedtime. Reported on 09/04/2015    . esomeprazole (NEXIUM) 40 MG capsule Take 1 capsule (40 mg total) by mouth 2 (two) times daily before a meal. AM 60 capsule 3  . ferrous sulfate 325 (65 FE) MG tablet Take 325 mg by mouth 2 (two) times daily.    . folic acid (FOLVITE) 1 MG tablet Take 1 mg by mouth daily. PM    . hydrALAZINE (APRESOLINE) 50 MG tablet Take 50 mg by mouth 3 (three) times daily. Reported on 09/04/2015    . lisinopril (PRINIVIL,ZESTRIL) 40 MG tablet Take 40 mg by mouth daily. PM    . nystatin (MYCOSTATIN) 100000 UNIT/ML suspension Take 5 mLs (500,000 Units total) by mouth 4 (four) times daily. 60 mL 0  . oxyCODONE-acetaminophen (PERCOCET) 5-325 MG per tablet Take 1 tablet by mouth 2 (two) times daily.    . sertraline (ZOLOFT) 50 MG tablet Take 50 mg by mouth daily. PM     No current facility-administered medications for this visit.      Marland Kitchen  PHYSICAL EXAMINATION:   Filed Vitals:   10/11/15 1012  BP: 231/103  Pulse: 60  Temp: 96.6 F (35.9 C)  Resp: 18   Filed Weights   10/11/15 1012  Weight: 200 lb 13.4 oz (91.1 kg)    GENERAL: Well-nourished well-developed; Alert, no distress and comfortable. Alone.  EYES: positive for pallor; no icterus OROPHARYNX: no thrush or ulceration; good dentition  NECK: supple, no masses felt LYMPH:  no palpable lymphadenopathy in the cervical, axillary or inguinal regions LUNGS: clear to auscultation and  No wheeze or crackles HEART/CVS: regular rate & rhythm and no murmurs; No lower extremity edema ABDOMEN: abdomen soft, non-tender and normal bowel sounds Musculoskeletal:no cyanosis of digits and no  clubbing  PSYCH: alert & oriented x 3 with fluent speech NEURO: no focal motor/sensory deficits SKIN:  no rashes or significant lesions  LABORATORY DATA:  I have reviewed the data as listed Lab Results  Component Value Date   WBC 6.2 10/11/2015   HGB 13.0 10/11/2015   HCT 39.9* 10/11/2015   MCV 80.8 10/11/2015   PLT 123* 10/11/2015    Recent Labs  01/19/15 0021  01/21/15 0433 01/22/15 0425 01/23/15 0521 08/28/15 1515  NA  --   < > 141 141 141 138  K  --   < >  4.0 3.5 3.6 3.6  CL  --   < > 109 111 114* 110  CO2  --   < > 26 24 23 24   GLUCOSE  --   < > 101* 99 108* 113*  BUN  --   < > 19 17 15 15   CREATININE  --   < > 2.16* 1.87* 1.68* 1.82*  CALCIUM  --   < > 7.8* 7.7* 8.0* 8.2*  GFRNONAA  --   < > 31* 37* 42* 38*  GFRAA  --   < > 36* 42* 48* 44*  PROT 7.2  --  5.9*  --   --  6.9  ALBUMIN 3.7  --  2.8*  --   --  3.4*  AST 51*  --  32  --   --  59*  ALT 42  --  30  --   --  53  ALKPHOS 82  --  71  --   --  79  BILITOT 0.1*  --  <0.1*  --   --  0.4  BILIDIR <0.1*  --   --   --   --   --   IBILI NOT CALCULATED  --   --   --   --   --   < > = values in this interval not displayed.   ASSESSMENT & PLAN:   # Severe iron deficiency anemia/hemoglobin- 6/ ferritin 5. Status post IV iron- significant improvement of hemoglobin. Hemoglobin 13 today. EGD with Dr. Vira Agar did not show any obvious that loss.   # Recommend follow-up labs in approximately 3 months/ week prior possible ferriheme at next visit in 3 months.     Cammie Sickle, MD 10/11/2015 10:33 AM

## 2015-10-11 NOTE — Progress Notes (Signed)
BP elevated today @ 231/102  HR 60.

## 2015-10-31 ENCOUNTER — Other Ambulatory Visit: Payer: Self-pay | Admitting: Internal Medicine

## 2015-10-31 ENCOUNTER — Ambulatory Visit
Admission: RE | Admit: 2015-10-31 | Discharge: 2015-10-31 | Disposition: A | Discharge status: Home / Self Care | Payer: Medicare Other | Source: Ambulatory Visit | Attending: Internal Medicine | Admitting: Internal Medicine

## 2015-10-31 DIAGNOSIS — M79661 Pain in right lower leg: Secondary | ICD-10-CM | POA: Insufficient documentation

## 2015-10-31 DIAGNOSIS — M7989 Other specified soft tissue disorders: Secondary | ICD-10-CM | POA: Diagnosis not present

## 2015-11-09 ENCOUNTER — Observation Stay
Admission: EM | Admit: 2015-11-09 | Discharge: 2015-11-11 | Disposition: A | Discharge status: Home / Self Care | Payer: Medicare Other | Attending: Internal Medicine | Admitting: Internal Medicine

## 2015-11-09 ENCOUNTER — Emergency Department: Payer: Medicare Other

## 2015-11-09 DIAGNOSIS — Z87442 Personal history of urinary calculi: Secondary | ICD-10-CM | POA: Diagnosis not present

## 2015-11-09 DIAGNOSIS — J9811 Atelectasis: Secondary | ICD-10-CM | POA: Insufficient documentation

## 2015-11-09 DIAGNOSIS — F329 Major depressive disorder, single episode, unspecified: Secondary | ICD-10-CM | POA: Diagnosis not present

## 2015-11-09 DIAGNOSIS — D500 Iron deficiency anemia secondary to blood loss (chronic): Secondary | ICD-10-CM | POA: Diagnosis not present

## 2015-11-09 DIAGNOSIS — Z79899 Other long term (current) drug therapy: Secondary | ICD-10-CM | POA: Diagnosis not present

## 2015-11-09 DIAGNOSIS — B182 Chronic viral hepatitis C: Secondary | ICD-10-CM | POA: Insufficient documentation

## 2015-11-09 DIAGNOSIS — I350 Nonrheumatic aortic (valve) stenosis: Secondary | ICD-10-CM | POA: Insufficient documentation

## 2015-11-09 DIAGNOSIS — Z809 Family history of malignant neoplasm, unspecified: Secondary | ICD-10-CM | POA: Insufficient documentation

## 2015-11-09 DIAGNOSIS — Z833 Family history of diabetes mellitus: Secondary | ICD-10-CM | POA: Diagnosis not present

## 2015-11-09 DIAGNOSIS — R262 Difficulty in walking, not elsewhere classified: Secondary | ICD-10-CM | POA: Diagnosis not present

## 2015-11-09 DIAGNOSIS — N183 Chronic kidney disease, stage 3 (moderate): Secondary | ICD-10-CM | POA: Diagnosis not present

## 2015-11-09 DIAGNOSIS — Z21 Asymptomatic human immunodeficiency virus [HIV] infection status: Secondary | ICD-10-CM | POA: Insufficient documentation

## 2015-11-09 DIAGNOSIS — R011 Cardiac murmur, unspecified: Secondary | ICD-10-CM | POA: Diagnosis not present

## 2015-11-09 DIAGNOSIS — R079 Chest pain, unspecified: Secondary | ICD-10-CM

## 2015-11-09 DIAGNOSIS — B20 Human immunodeficiency virus [HIV] disease: Secondary | ICD-10-CM | POA: Diagnosis present

## 2015-11-09 DIAGNOSIS — Z9049 Acquired absence of other specified parts of digestive tract: Secondary | ICD-10-CM | POA: Insufficient documentation

## 2015-11-09 DIAGNOSIS — I071 Rheumatic tricuspid insufficiency: Secondary | ICD-10-CM | POA: Insufficient documentation

## 2015-11-09 DIAGNOSIS — R0789 Other chest pain: Secondary | ICD-10-CM | POA: Diagnosis not present

## 2015-11-09 DIAGNOSIS — Z8249 Family history of ischemic heart disease and other diseases of the circulatory system: Secondary | ICD-10-CM | POA: Diagnosis not present

## 2015-11-09 DIAGNOSIS — Z87891 Personal history of nicotine dependence: Secondary | ICD-10-CM | POA: Diagnosis not present

## 2015-11-09 DIAGNOSIS — R531 Weakness: Secondary | ICD-10-CM | POA: Insufficient documentation

## 2015-11-09 DIAGNOSIS — R0602 Shortness of breath: Secondary | ICD-10-CM | POA: Diagnosis present

## 2015-11-09 DIAGNOSIS — I2510 Atherosclerotic heart disease of native coronary artery without angina pectoris: Secondary | ICD-10-CM | POA: Insufficient documentation

## 2015-11-09 DIAGNOSIS — M109 Gout, unspecified: Secondary | ICD-10-CM | POA: Diagnosis not present

## 2015-11-09 DIAGNOSIS — I129 Hypertensive chronic kidney disease with stage 1 through stage 4 chronic kidney disease, or unspecified chronic kidney disease: Secondary | ICD-10-CM | POA: Diagnosis not present

## 2015-11-09 DIAGNOSIS — R001 Bradycardia, unspecified: Secondary | ICD-10-CM

## 2015-11-09 DIAGNOSIS — R9431 Abnormal electrocardiogram [ECG] [EKG]: Secondary | ICD-10-CM

## 2015-11-09 LAB — CBC WITH DIFFERENTIAL/PLATELET
BASOS PCT: 1 %
Basophils Absolute: 0.1 10*3/uL (ref 0–0.1)
EOS ABS: 0.1 10*3/uL (ref 0–0.7)
EOS PCT: 1 %
HCT: 43.8 % (ref 40.0–52.0)
Hemoglobin: 13.9 g/dL (ref 13.0–18.0)
LYMPHS ABS: 1.2 10*3/uL (ref 1.0–3.6)
Lymphocytes Relative: 14 %
MCH: 27.3 pg (ref 26.0–34.0)
MCHC: 31.9 g/dL — AB (ref 32.0–36.0)
MCV: 85.8 fL (ref 80.0–100.0)
MONO ABS: 0.6 10*3/uL (ref 0.2–1.0)
MONOS PCT: 7 %
NEUTROS PCT: 77 %
Neutro Abs: 6.3 10*3/uL (ref 1.4–6.5)
PLATELETS: 129 10*3/uL — AB (ref 150–440)
RBC: 5.1 MIL/uL (ref 4.40–5.90)
RDW: 22 % — AB (ref 11.5–14.5)
WBC: 8.2 10*3/uL (ref 3.8–10.6)

## 2015-11-09 LAB — COMPREHENSIVE METABOLIC PANEL
ALBUMIN: 3.8 g/dL (ref 3.5–5.0)
ALT: 48 U/L (ref 17–63)
ANION GAP: 6 (ref 5–15)
AST: 42 U/L — ABNORMAL HIGH (ref 15–41)
Alkaline Phosphatase: 74 U/L (ref 38–126)
BUN: 22 mg/dL — ABNORMAL HIGH (ref 6–20)
CALCIUM: 9.6 mg/dL (ref 8.9–10.3)
CO2: 25 mmol/L (ref 22–32)
Chloride: 107 mmol/L (ref 101–111)
Creatinine, Ser: 1.86 mg/dL — ABNORMAL HIGH (ref 0.61–1.24)
GFR calc non Af Amer: 37 mL/min — ABNORMAL LOW (ref >60)
GFR, EST AFRICAN AMERICAN: 43 mL/min — AB (ref >60)
GLUCOSE: 126 mg/dL — AB (ref 65–99)
POTASSIUM: 4.2 mmol/L (ref 3.5–5.1)
SODIUM: 138 mmol/L (ref 135–145)
Total Bilirubin: 0.4 mg/dL (ref 0.3–1.2)
Total Protein: 7.3 g/dL (ref 6.5–8.1)

## 2015-11-09 LAB — TROPONIN I: Troponin I: <0.03 ng/mL (ref <0.031)

## 2015-11-09 MED ORDER — BISACODYL 10 MG RE SUPP: 10.0000 mg | Freq: Every day | RECTAL | Status: DC | PRN

## 2015-11-09 MED ORDER — ALLOPURINOL 300 MG PO TABS
300.0000 mg | ORAL_TABLET | Freq: Every day | ORAL | Status: DC
  Administered 2015-11-10 – 2015-11-11 (×2): 300 mg via ORAL
  Filled 2015-11-09 (×2): qty 1

## 2015-11-09 MED ORDER — SODIUM CHLORIDE 0.9% FLUSH
3.0000 mL | Freq: Two times a day (BID) | INTRAVENOUS | Status: DC
  Administered 2015-11-10 – 2015-11-11 (×3): 3 mL via INTRAVENOUS

## 2015-11-09 MED ORDER — NITROGLYCERIN 0.4 MG SL SUBL: 0.4000 mg | SUBLINGUAL_TABLET | SUBLINGUAL | Status: DC | PRN

## 2015-11-09 MED ORDER — ONDANSETRON HCL 4 MG/2ML IJ SOLN
INTRAMUSCULAR | Status: AC
  Administered 2015-11-09: 18:00:00
  Filled 2015-11-09: qty 2

## 2015-11-09 MED ORDER — ONDANSETRON HCL 4 MG/2ML IJ SOLN
4.0000 mg | Freq: Once | INTRAMUSCULAR | Status: AC
  Administered 2015-11-09: 4 mg via INTRAVENOUS
  Filled 2015-11-09: qty 2

## 2015-11-09 MED ORDER — FERROUS SULFATE 325 (65 FE) MG PO TABS
325.0000 mg | ORAL_TABLET | Freq: Two times a day (BID) | ORAL | Status: DC
  Administered 2015-11-09 – 2015-11-11 (×4): 325 mg via ORAL
  Filled 2015-11-09 (×4): qty 1

## 2015-11-09 MED ORDER — LOSARTAN POTASSIUM 50 MG PO TABS
100.0000 mg | ORAL_TABLET | Freq: Every day | ORAL | Status: DC
  Administered 2015-11-10 – 2015-11-11 (×2): 100 mg via ORAL
  Filled 2015-11-09 (×2): qty 2

## 2015-11-09 MED ORDER — NYSTATIN 100000 UNIT/ML MT SUSP
5.0000 mL | Freq: Four times a day (QID) | OROMUCOSAL | Status: DC
  Administered 2015-11-09 – 2015-11-11 (×5): 500000 [IU] via ORAL
  Filled 2015-11-09 (×11): qty 5

## 2015-11-09 MED ORDER — CLONIDINE HCL 0.1 MG PO TABS
0.1000 mg | ORAL_TABLET | Freq: Three times a day (TID) | ORAL | Status: DC
Start: 2015-11-09 — End: 2015-11-11
  Administered 2015-11-09 – 2015-11-11 (×6): 0.1 mg via ORAL
  Filled 2015-11-09 (×6): qty 1

## 2015-11-09 MED ORDER — MORPHINE SULFATE (PF) 2 MG/ML IV SOLN
INTRAVENOUS | Status: AC
  Filled 2015-11-09: qty 1

## 2015-11-09 MED ORDER — ASPIRIN EC 81 MG PO TBEC
81.0000 mg | DELAYED_RELEASE_TABLET | Freq: Every day | ORAL | Status: DC
  Administered 2015-11-10 – 2015-11-11 (×2): 81 mg via ORAL
  Filled 2015-11-09 (×2): qty 1

## 2015-11-09 MED ORDER — MORPHINE SULFATE (PF) 2 MG/ML IV SOLN
2.0000 mg | INTRAVENOUS | Status: DC | PRN
Start: 2015-11-09 — End: 2015-11-11
  Administered 2015-11-09 – 2015-11-10 (×4): 2 mg via INTRAVENOUS
  Filled 2015-11-09 (×3): qty 1

## 2015-11-09 MED ORDER — ONDANSETRON HCL 4 MG PO TABS: 4.0000 mg | ORAL_TABLET | Freq: Four times a day (QID) | ORAL | Status: DC | PRN

## 2015-11-09 MED ORDER — SODIUM CHLORIDE 0.9 % IV SOLN
INTRAVENOUS | Status: DC
  Administered 2015-11-09 – 2015-11-10 (×2): via INTRAVENOUS

## 2015-11-09 MED ORDER — FOLIC ACID 1 MG PO TABS
1.0000 mg | ORAL_TABLET | Freq: Every day | ORAL | Status: DC
  Administered 2015-11-10 – 2015-11-11 (×2): 1 mg via ORAL
  Filled 2015-11-09 (×2): qty 1

## 2015-11-09 MED ORDER — ACETAMINOPHEN 650 MG RE SUPP: 650.0000 mg | Freq: Four times a day (QID) | RECTAL | Status: DC | PRN

## 2015-11-09 MED ORDER — EMTRICITABINE-TENOFOVIR AF 200-25 MG PO TABS
1.0000 | ORAL_TABLET | Freq: Every day | ORAL | Status: DC
  Administered 2015-11-10: 1 via ORAL
  Filled 2015-11-09 (×2): qty 1

## 2015-11-09 MED ORDER — SERTRALINE HCL 50 MG PO TABS
50.0000 mg | ORAL_TABLET | Freq: Every day | ORAL | Status: DC
  Administered 2015-11-10 – 2015-11-11 (×2): 50 mg via ORAL
  Filled 2015-11-09 (×2): qty 1

## 2015-11-09 MED ORDER — ONDANSETRON HCL 4 MG/2ML IJ SOLN
4.0000 mg | Freq: Four times a day (QID) | INTRAMUSCULAR | Status: DC | PRN
  Administered 2015-11-09: 4 mg via INTRAVENOUS

## 2015-11-09 MED ORDER — CHLORTHALIDONE 25 MG PO TABS
25.0000 mg | ORAL_TABLET | Freq: Every day | ORAL | Status: DC
Start: 2015-11-09 — End: 2015-11-11
  Administered 2015-11-10 – 2015-11-11 (×2): 25 mg via ORAL
  Filled 2015-11-09 (×2): qty 1

## 2015-11-09 MED ORDER — MORPHINE SULFATE (PF) 4 MG/ML IV SOLN
4.0000 mg | Freq: Once | INTRAVENOUS | Status: AC
  Administered 2015-11-09: 4 mg via INTRAVENOUS
  Filled 2015-11-09: qty 1

## 2015-11-09 MED ORDER — CYCLOBENZAPRINE HCL 10 MG PO TABS
10.0000 mg | ORAL_TABLET | Freq: Every day | ORAL | Status: DC
  Administered 2015-11-10 – 2015-11-11 (×2): 10 mg via ORAL
  Filled 2015-11-09 (×2): qty 1

## 2015-11-09 MED ORDER — DOXAZOSIN MESYLATE 4 MG PO TABS
8.0000 mg | ORAL_TABLET | Freq: Every day | ORAL | Status: DC
  Administered 2015-11-09 – 2015-11-10 (×2): 8 mg via ORAL
  Filled 2015-11-09 (×2): qty 2

## 2015-11-09 MED ORDER — SODIUM CHLORIDE 0.9 % IV BOLUS (SEPSIS)
1000.0000 mL | Freq: Once | INTRAVENOUS | Status: AC
  Administered 2015-11-09: 1000 mL via INTRAVENOUS

## 2015-11-09 MED ORDER — ASPIRIN 81 MG PO CHEW
324.0000 mg | CHEWABLE_TABLET | Freq: Once | ORAL | Status: AC
  Administered 2015-11-09: 324 mg via ORAL
  Filled 2015-11-09: qty 4

## 2015-11-09 MED ORDER — ACETAMINOPHEN 325 MG PO TABS
650.0000 mg | ORAL_TABLET | Freq: Four times a day (QID) | ORAL | Status: DC | PRN
  Administered 2015-11-10: 650 mg via ORAL
  Filled 2015-11-09 (×2): qty 2

## 2015-11-09 MED ORDER — IOPAMIDOL (ISOVUE-370) INJECTION 76%
75.0000 mL | Freq: Once | INTRAVENOUS | Status: AC | PRN
  Administered 2015-11-09: 75 mL via INTRAVENOUS

## 2015-11-09 MED ORDER — DOLUTEGRAVIR SODIUM 50 MG PO TABS
50.0000 mg | ORAL_TABLET | Freq: Every day | ORAL | Status: DC
Start: 2015-11-09 — End: 2015-11-11
  Administered 2015-11-10: 50 mg via ORAL
  Filled 2015-11-09 (×2): qty 1

## 2015-11-09 MED ORDER — DOCUSATE SODIUM 100 MG PO CAPS
100.0000 mg | ORAL_CAPSULE | Freq: Two times a day (BID) | ORAL | Status: DC
  Administered 2015-11-09 – 2015-11-11 (×4): 100 mg via ORAL
  Filled 2015-11-09 (×4): qty 1

## 2015-11-09 MED ORDER — DIAZEPAM 5 MG PO TABS
5.0000 mg | ORAL_TABLET | Freq: Three times a day (TID) | ORAL | Status: DC | PRN
Start: 2015-11-09 — End: 2015-11-11
  Administered 2015-11-09 – 2015-11-10 (×2): 5 mg via ORAL
  Filled 2015-11-09 (×2): qty 1

## 2015-11-09 MED ORDER — HYDRALAZINE HCL 50 MG PO TABS
50.0000 mg | ORAL_TABLET | Freq: Three times a day (TID) | ORAL | Status: DC
  Administered 2015-11-09 – 2015-11-11 (×6): 50 mg via ORAL
  Filled 2015-11-09 (×7): qty 1

## 2015-11-09 MED ORDER — HEPARIN SODIUM (PORCINE) 5000 UNIT/ML IJ SOLN
5000.0000 [IU] | Freq: Three times a day (TID) | INTRAMUSCULAR | Status: DC
  Administered 2015-11-09 – 2015-11-11 (×6): 5000 [IU] via SUBCUTANEOUS
  Filled 2015-11-09 (×6): qty 1

## 2015-11-09 MED ORDER — PANTOPRAZOLE SODIUM 40 MG PO TBEC
40.0000 mg | DELAYED_RELEASE_TABLET | Freq: Every day | ORAL | Status: DC
  Administered 2015-11-10 – 2015-11-11 (×2): 40 mg via ORAL
  Filled 2015-11-09 (×2): qty 1

## 2015-11-09 NOTE — H&P (Signed)
History and Physical    Jared Walker [id number] DOB: [birth year] DOA: 11/09/2015  Referring physician: Dr. Edd Fabian PCP: Tracie Harrier, MD  Specialists: none  Chief Complaint: chest pain  HPI: Jared Walker is a 65 y.o. male has a past medical history significant for HTN, CKD, and HIV positivity now with 2 day hx of progressive CP. Initially, CP was only with exertional radiating to left arm. Now pain is occurring at rest. Denies cardiac hx. In the ER, EKG shows lateral T-wave inversion. He is also bradycardic. He is now admitted. Denies N/V, SOB, or diaphoresis. Troponin normal in ER. Pain is made worse with palpation.  Review of Systems: The patient denies anorexia, fever, weight loss,, vision loss, decreased hearing, hoarseness, syncope, dyspnea on exertion, peripheral edema, balance deficits, hemoptysis, abdominal pain, melena, hematochezia, severe indigestion/heartburn, hematuria, incontinence, genital sores, muscle weakness, suspicious skin lesions, transient blindness, difficulty walking, depression, unusual weight change, abnormal bleeding, enlarged lymph nodes, angioedema, and breast masses.   Past Medical History  Diagnosis Date  . Gout   . Hypertension   . Heart murmur   . Aortic stenosis, mild     mild AS by echo, 08/2011  . Chronic kidney disease     kidney stones  . HIV positive ([place]) 09/25/11  . Hepatitis C 09/25/11  . Full dentures     upper and lower  . Hep C w/o coma, chronic ([place]) 01/19/2015  . Anemia   . Depression    Past Surgical History  Procedure Laterality Date  . Cholecystectomy    . Closed reduction pelvic fracture    . Cardiovascular stress test      at [facility]  . Knee arthroscopy  2009    Right  . Nephrostomy      tube Left  . Lumbar laminectomy/decompression microdiscectomy  09/25/2011    Procedure: LUMBAR LAMINECTOMY/DECOMPRESSION MICRODISCECTOMY;  Surgeon: Erline Levine, MD;  Location: [facility];  Service: Neurosurgery;   Laterality: Left;  Left Lumbar Four-Five Microdiskectomy  . Shoulder arthroscopy with open rotator cuff repair Right 10/12/2014    Procedure: SHOULDER ARTHROSCOP with decompression;  Surgeon: Leanor Kail, MD;  Location: [facility];  Service: Orthopedics;  Laterality: Right;  . Fracture surgery    . Back surgery    . Esophagogastroduodenoscopy (egd) with propofol N/A 01/21/2015    Procedure: ESOPHAGOGASTRODUODENOSCOPY (EGD) WITH PROPOFOL;  Surgeon: Manya Silvas, MD;  Location: [facility];  Service: Endoscopy;  Laterality: N/A;  . Esophagogastroduodenoscopy (egd) with propofol N/A 09/04/2015    Procedure: ESOPHAGOGASTRODUODENOSCOPY (EGD) WITH PROPOFOL;  Surgeon: Manya Silvas, MD;  Location: [facility];  Service: Endoscopy;  Laterality: N/A;   Social History:  reports that he has quit smoking. He quit smokeless tobacco use about 43 years ago. He reports that he does not drink alcohol or use illicit drugs.  Allergies  Allergen Reactions  . Buchu-Cornsilk-Ch Grass-Hydran Other (See Comments)    Gout.  . Colchicine Diarrhea    "Have diarrhea when taken for long periods of time"  . Nifedipine Other (See Comments)    "Unknown"  . Oxycodone-Acetaminophen Other (See Comments)    GI upset.  . Tramadol Other (See Comments)    "unknown"    Family History  Problem Relation Age of Onset  . Anesthesia problems Neg Hx   . Hypertension Mother   . Cancer Father   . Heart disease Sister   . Diabetes Brother     Prior to Admission medications   Medication Sig Start  Date End Date Taking? Authorizing Provider  allopurinol (ZYLOPRIM) 300 MG tablet Take 300 mg by mouth daily. Take with (2) 100 mg tabs. Total 500 mg.  PM    Historical Provider, MD  atenolol (TENORMIN) 100 MG tablet Take 100 mg by mouth daily. PM    Historical Provider, MD  chlorthalidone (HYGROTON) 25 MG tablet Take 25 mg by mouth daily. Reported on 09/04/2015    Historical Provider, MD  cloNIDine (CATAPRES) 0.1  MG tablet Take 0.1 mg by mouth daily. 01/08/15 01/08/16  Historical Provider, MD  colchicine 0.6 MG tablet Take 0.6 mg by mouth as needed.    Historical Provider, MD  cyclobenzaprine (FLEXERIL) 10 MG tablet Take 10 mg by mouth daily. Reported on 09/04/2015    Historical Provider, MD  DESCOVY 200-25 MG tablet Take 1 tablet by mouth daily. 08/12/15   Historical Provider, MD  diazepam (VALIUM) 5 MG tablet Take 5 mg by mouth at bedtime as needed. For sleep    Historical Provider, MD  docusate sodium (COLACE) 100 MG capsule Take 100 mg by mouth 2 (two) times daily. 05/24/14   Historical Provider, MD  dolutegravir (TIVICAY) 50 MG tablet Take 50 mg by mouth. 07/31/15   Historical Provider, MD  doxazosin (CARDURA) 4 MG tablet Take 8 mg by mouth at bedtime. Reported on 09/04/2015    Historical Provider, MD  esomeprazole (NEXIUM) 40 MG capsule Take 1 capsule (40 mg total) by mouth 2 (two) times daily before a meal. AM 01/23/15   Tracie Harrier, MD  ferrous sulfate 325 (65 FE) MG tablet Take 325 mg by mouth 2 (two) times daily. 01/10/15 01/10/16  Historical Provider, MD  folic acid (FOLVITE) 1 MG tablet Take 1 mg by mouth daily. PM    Historical Provider, MD  hydrALAZINE (APRESOLINE) 50 MG tablet Take 50 mg by mouth 3 (three) times daily. Reported on 09/04/2015    Historical Provider, MD  lisinopril (PRINIVIL,ZESTRIL) 40 MG tablet Take 40 mg by mouth daily. PM    Historical Provider, MD  nystatin (MYCOSTATIN) 100000 UNIT/ML suspension Take 5 mLs (500,000 Units total) by mouth 4 (four) times daily. 01/23/15   Tracie Harrier, MD  oxyCODONE-acetaminophen (PERCOCET) 5-325 MG per tablet Take 1 tablet by mouth 2 (two) times daily.    Historical Provider, MD  sertraline (ZOLOFT) 50 MG tablet Take 50 mg by mouth daily. PM    Historical Provider, MD   Physical Exam: Filed Vitals:   11/09/15 1230 11/09/15 1300 11/09/15 1403 11/09/15 1404  BP: 192/109 183/95 188/93 188/93  Pulse: 55 49 48 49  Temp:      TempSrc:      Resp: 13  17 14 18   Height:      Weight:      SpO2: 99% 98% 98% 98%     General:  No apparent distress, WDWN. [place]/AT  Eyes: PERRL, EOMI, no scleral icterus, conjunctiva clear  ENT: moist oropharynx without lesions, dentition good, TM's benign  Neck: supple, no lymphadenopathy. No bruits or thyromegaly  Cardiovascular: regular rate without MRG; 2+ peripheral pulses, no JVD, no peripheral edema  Respiratory: CTA biL, good air movement without wheezing, rhonchi or crackled, respiratory effort normal   Abdomen: soft, non tender to palpation, positive bowel sounds, no guarding, no rebound  Skin: no rashes or lesions  Musculoskeletal: normal bulk and tone, no joint swelling  Psychiatric: normal mood and affect, A&OX3  Neurologic: CN 2-12 grossly intact, Motor strength 5/5 in all 4 groups with non-focal sensory exam  and symmetric DTR's.  Labs on Admission:  Basic Metabolic Panel:  Recent Labs Lab 11/09/15 1205  NA 138  K 4.2  CL 107  CO2 25  GLUCOSE 126*  BUN 22*  CREATININE 1.86*  CALCIUM 9.6   Liver Function Tests:  Recent Labs Lab 11/09/15 1205  AST 42*  ALT 48  ALKPHOS 74  BILITOT 0.4  PROT 7.3  ALBUMIN 3.8   No results for input(s): LIPASE, AMYLASE in the last 168 hours. No results for input(s): AMMONIA in the last 168 hours. CBC:  Recent Labs Lab 11/09/15 1205  WBC 8.2  NEUTROABS 6.3  HGB 13.9  HCT 43.8  MCV 85.8  PLT 129*   Cardiac Enzymes:  Recent Labs Lab 11/09/15 1205  TROPONINI <0.03    BNP (last 3 results) No results for input(s): BNP in the last 8760 hours.  ProBNP (last 3 results) No results for input(s): PROBNP in the last 8760 hours.  CBG: No results for input(s): GLUCAP in the last 168 hours.  Radiological Exams on Admission: Ct Angio Chest Pe W/cm &/or Wo Cm  11/09/2015  CLINICAL DATA:  C/O SOB and CP since yesterday. RLE swelling since last week. No hx CA or surg to chest. HX HIV. TKV EXAM: CT ANGIOGRAPHY CHEST WITH CONTRAST  TECHNIQUE: Multidetector CT imaging of the chest was performed using the standard protocol during bolus administration of intravenous contrast. Multiplanar CT image reconstructions and MIPs were obtained to evaluate the vascular anatomy. CONTRAST:  75 mL Isovue 370 IV COMPARISON:  None. FINDINGS: Vascular: Left arm IV contrast administration. Innominate vein and SVC are patent. Right atrium and right ventricle are nondilated. Satisfactory opacification of pulmonary arteries noted, and there is no evidence of pulmonary emboli. Patent bilateral pulmonary veins drain into the left atrium. Scattered coronary calcifications. Aortic valve leaflet calcifications. Adequate contrast opacification of the thoracic aorta with no evidence of dissection, aneurysm, or stenosis. There is classic 3-vessel brachiocephalic arch anatomy without proximal stenosis. Mild plaque in the aortic arch and descending segment. Mediastinum/Lymph Nodes: No masses or pathologically enlarged lymph nodes identified. No pericardial effusion. Lungs/Pleura: Mild dependent atelectasis posteriorly in both lower lobes. 4 mm subpleural nodule, superior segment right lower lobe image 79/6. 4 mm nodule adjacent to the right major fissure image 81/6. Lungs otherwise clear. No pleural effusion. No pneumothorax. Upper abdomen: No acute findings. Cholecystectomy clips and coarse calcification in the gallbladder fossa. Musculoskeletal: No chest wall mass or suspicious bone lesions identified. Review of the MIP images confirms the above findings. IMPRESSION: 1. Negative for acute PE or thoracic aortic dissection. 2. Atherosclerosis, including aortic and coronary artery disease. Please note that although the presence of coronary artery calcium documents the presence of coronary artery disease, the severity of this disease and any potential stenosis cannot be assessed on this non-gated CT examination. Assessment for potential risk factor modification, dietary therapy  or pharmacologic therapy may be warranted, if clinically indicated. 3. Two subpleural 4 mm right lower lobe pulmonary nodules. No follow-up needed if patient is low-risk (and has no known or suspected primary neoplasm). Non-contrast chest CT can be considered in 12 months if patient is high-risk. This recommendation follows the consensus statement: Guidelines for Management of Incidental Pulmonary Nodules Detected on CT Images:From the Fleischner Society 2017; published online before print (10.1148/radiol.IJ:2314499). Electronically Signed   By: Lucrezia Europe M.D.   On: 11/09/2015 13:36    EKG: Independently reviewed.  Assessment/Plan Principal Problem:   Atypical chest pain Active Problems:  HIV (human immunodeficiency virus infection) ([place])   Bradycardia   Abnormal EKG   Will observe on telemetry and follow enzymes. Order echo. Consult Cardiology. Hold beta-blocker due to bradycardia.  Diet: low sodium Fluids: NS@100   DVT Prophylaxis: SQ Heparin  Code Status: FULL  Family Communication: none   Disposition Plan: home  Time spent: 50 min

## 2015-11-09 NOTE — ED Notes (Signed)
Report called to Tammy, RN

## 2015-11-09 NOTE — ED Notes (Signed)
Pt taken to CT.

## 2015-11-09 NOTE — ED Provider Notes (Signed)
[facility] Emergency Department Provider Note   ____________________________________________  Time seen: Approximately 11:56 AM  I have reviewed the triage vital signs and the nursing notes.   HISTORY  Chief Complaint Chest Pain    HPI Jared Walker is a 65 y.o. male with history of hypertension, HIV on antiretroviral therapy, hep C, anemia, aortic stenosis who presents for evaluation of exertional right-sided chest pain and shortness of breath intermittent since yesterday, gradual onset, currently mild, improves with sitting/lying down/rest. He reports the pain radiates into his right arm. No vomiting diarrhea fevers or chills. He has had swelling in the right lower extremity for over 2 weeks. He had negative Doppler ultrasound of the right leg performed on 10/25/2015 at [facility] and then again at Dr. Linton Ham office on 10/31/2015.   Past Medical History  Diagnosis Date  . Gout   . Hypertension   . Heart murmur   . Aortic stenosis, mild     mild AS by echo, 08/2011  . Chronic kidney disease     kidney stones  . HIV positive ([place]) 09/25/11  . Hepatitis C 09/25/11  . Full dentures     upper and lower  . Hep C w/o coma, chronic ([place]) 01/19/2015  . Anemia   . Depression     Patient Active Problem List   Diagnosis Date Noted  . Atypical chest pain 11/09/2015  . Bradycardia 11/09/2015  . Abnormal EKG 11/09/2015  . Iron deficiency anemia 08/27/2015  . Chest pain 01/19/2015  . Anemia 01/19/2015  . GI bleed 01/19/2015  . Pica in adults 01/19/2015  . CKD (chronic kidney disease), stage III 01/19/2015  . HIV (human immunodeficiency virus infection) ([place]) 01/19/2015  . HTN (hypertension) 01/19/2015  . Hep C w/o coma, chronic ([place]) 01/19/2015  . CKD (chronic kidney disease) 01/19/2015    Past Surgical History  Procedure Laterality Date  . Cholecystectomy    . Closed reduction pelvic fracture    . Cardiovascular stress test      at [facility]  . Knee  arthroscopy  2009    Right  . Nephrostomy      tube Left  . Lumbar laminectomy/decompression microdiscectomy  09/25/2011    Procedure: LUMBAR LAMINECTOMY/DECOMPRESSION MICRODISCECTOMY;  Surgeon: Erline Levine, MD;  Location: [facility];  Service: Neurosurgery;  Laterality: Left;  Left Lumbar Four-Five Microdiskectomy  . Shoulder arthroscopy with open rotator cuff repair Right 10/12/2014    Procedure: SHOULDER ARTHROSCOP with decompression;  Surgeon: Leanor Kail, MD;  Location: [place];  Service: Orthopedics;  Laterality: Right;  . Fracture surgery    . Back surgery    . Esophagogastroduodenoscopy (egd) with propofol N/A 01/21/2015    Procedure: ESOPHAGOGASTRODUODENOSCOPY (EGD) WITH PROPOFOL;  Surgeon: Manya Silvas, MD;  Location: [place];  Service: Endoscopy;  Laterality: N/A;  . Esophagogastroduodenoscopy (egd) with propofol N/A 09/04/2015    Procedure: ESOPHAGOGASTRODUODENOSCOPY (EGD) WITH PROPOFOL;  Surgeon: Manya Silvas, MD;  Location: [facility] ENDOSCOPY;  Service: Endoscopy;  Laterality: N/A;    Current Outpatient Rx  Name  Route  Sig  Dispense  Refill  . allopurinol (ZYLOPRIM) 300 MG tablet   Oral   Take 300 mg by mouth daily. Take with (2) 100 mg tabs. Total 500 mg.  PM         . atenolol (TENORMIN) 100 MG tablet   Oral   Take 100 mg by mouth daily. PM         . chlorthalidone (HYGROTON) 25  MG tablet   Oral   Take 25 mg by mouth daily. Reported on 09/04/2015         . cloNIDine (CATAPRES) 0.1 MG tablet   Oral   Take 0.1 mg by mouth daily.         . colchicine 0.6 MG tablet   Oral   Take 0.6 mg by mouth as needed.         . cyclobenzaprine (FLEXERIL) 10 MG tablet   Oral   Take 10 mg by mouth daily. Reported on 09/04/2015         . DESCOVY 200-25 MG tablet   Oral   Take 1 tablet by mouth daily.      3     Dispense as written.   . diazepam (VALIUM) 5 MG tablet   Oral   Take 5 mg by mouth at bedtime as needed. For sleep           . docusate sodium (COLACE) 100 MG capsule   Oral   Take 100 mg by mouth 2 (two) times daily.         . dolutegravir (TIVICAY) 50 MG tablet   Oral   Take 50 mg by mouth.         . doxazosin (CARDURA) 4 MG tablet   Oral   Take 8 mg by mouth at bedtime. Reported on 09/04/2015         . esomeprazole (NEXIUM) 40 MG capsule   Oral   Take 1 capsule (40 mg total) by mouth 2 (two) times daily before a meal. AM   60 capsule   3   . ferrous sulfate 325 (65 FE) MG tablet   Oral   Take 325 mg by mouth 2 (two) times daily.         . folic acid (FOLVITE) 1 MG tablet   Oral   Take 1 mg by mouth daily. PM         . hydrALAZINE (APRESOLINE) 50 MG tablet   Oral   Take 50 mg by mouth 3 (three) times daily. Reported on 09/04/2015         . lisinopril (PRINIVIL,ZESTRIL) 40 MG tablet   Oral   Take 40 mg by mouth daily. PM         . nystatin (MYCOSTATIN) 100000 UNIT/ML suspension   Oral   Take 5 mLs (500,000 Units total) by mouth 4 (four) times daily.   60 mL   0   . oxyCODONE-acetaminophen (PERCOCET) 5-325 MG per tablet   Oral   Take 1 tablet by mouth 2 (two) times daily.         . sertraline (ZOLOFT) 50 MG tablet   Oral   Take 50 mg by mouth daily. PM           Allergies Buchu-cornsilk-ch grass-hydran; Colchicine; Nifedipine; Oxycodone-acetaminophen; and Tramadol  Family History  Problem Relation Age of Onset  . Anesthesia problems Neg Hx   . Hypertension Mother   . Cancer Father   . Heart disease Sister   . Diabetes Brother     Social History Social History  Substance Use Topics  . Smoking status: Former Research scientist (life sciences)  . Smokeless tobacco: Former Systems developer    Quit date: 03/13/1972  . Alcohol Use: No    Review of Systems Constitutional: No fever/chills Eyes: No visual changes. ENT: No sore throat. Cardiovascular: + chest pain. Respiratory: +shortness of breath. Gastrointestinal: No abdominal pain.  No nausea, no vomiting.  No diarrhea.  No  constipation. Genitourinary: Negative for dysuria. Musculoskeletal: Negative for back pain. Skin: Negative for rash. Neurological: Negative for headaches, focal weakness or numbness.  10-point ROS otherwise negative.  ____________________________________________   PHYSICAL EXAM:  VITAL SIGNS: ED Triage Vitals  Enc Vitals Group     BP 11/09/15 1137 199/99 mmHg     Pulse Rate 11/09/15 1137 55     Resp 11/09/15 1137 20     Temp 11/09/15 1137 98.1 F (36.7 C)     Temp Source 11/09/15 1137 Oral     SpO2 11/09/15 1137 98 %     Weight 11/09/15 1137 203 lb (92.08 kg)     Height 11/09/15 1137 5\' 11"  (1.803 m)     Head Cir --      Peak Flow --      Pain Score 11/09/15 1139 6     Pain Loc --      Pain Edu? --      Excl. in [place]? --     Constitutional: Alert and oriented. Well appearing and in no acute distress. Eyes: Conjunctivae are normal. PERRL. EOMI. Head: Atraumatic. Nose: No congestion/rhinnorhea. Mouth/Throat: Mucous membranes are moist.  Oropharynx non-erythematous. Neck: No stridor.  Supple without meningismus. Cardiovascular: Normal rate, regular rhythm. Grossly normal heart sounds.  Good peripheral circulation. Respiratory: Normal respiratory effort.  No retractions. Lungs CTAB. Gastrointestinal: Soft and nontender. No distention.  No CVA tenderness. Genitourinary: deferred Musculoskeletal: Minimal swelling in the right ankle, distal portion of the right shin without erythema or induration. No other edema is noted. 2+ right and left DP pulse, wiggles the toes. Neurologic:  Normal speech and language. No gross focal neurologic deficits are appreciated. Skin:  Skin is warm, dry and intact. No rash noted. Psychiatric: Mood and affect are normal. Speech and behavior are normal.  ____________________________________________   LABS (all labs ordered are listed, but only abnormal results are displayed)  Labs Reviewed  CBC WITH DIFFERENTIAL/PLATELET - Abnormal; Notable for  the following:    MCHC 31.9 (*)    RDW 22.0 (*)    Platelets 129 (*)    All other components within normal limits  COMPREHENSIVE METABOLIC PANEL - Abnormal; Notable for the following:    Glucose, Bld 126 (*)    BUN 22 (*)    Creatinine, Ser 1.86 (*)    AST 42 (*)    GFR calc non Af Amer 37 (*)    GFR calc Af Amer 43 (*)    All other components within normal limits  TROPONIN I   ____________________________________________  EKG  ED ECG REPORT I, Joanne Gavel, the attending physician, personally viewed and interpreted this ECG.   Date: 11/09/2015  EKG Time: 11:35  Rate: 52  Rhythm: sinus bradycardia  Axis: normal  Intervals:none  ST&T Change: No acute ST elevation. T-wave inversions in lead 1 and aVL are new when compared to the EKG obtained on 01/18/2015.  ____________________________________________  RADIOLOGY  CTA chest IMPRESSION: 1. Negative for acute PE or thoracic aortic dissection. 2. Atherosclerosis, including aortic and coronary artery disease. Please note that although the presence of coronary artery calcium documents the presence of coronary artery disease, the severity of this disease and any potential stenosis cannot be assessed on this non-gated CT examination. Assessment for potential risk factor modification, dietary therapy or pharmacologic therapy may be warranted, if clinically indicated. 3. Two subpleural 4 mm right lower lobe pulmonary nodules. No follow-up needed if patient is low-risk (and has  no known or suspected primary neoplasm). Non-contrast chest CT can be considered in 12 months if patient is high-risk. This recommendation follows the consensus statement: Guidelines for Management of Incidental Pulmonary Nodules Detected on CT Images:From the Fleischner Society 2017; published online before print (10.1148/radiol.IJ:2314499). ____________________________________________   PROCEDURES  Procedure(s) performed: None  Critical Care  performed: No  ____________________________________________   INITIAL IMPRESSION / ASSESSMENT AND PLAN / ED COURSE  Pertinent labs & imaging results that were available during my care of the patient were reviewed by me and considered in my medical decision making (see chart for details).  Angie AMAR SAVIO is a 65 y.o. male with history of hypertension, HIV on antiretroviral therapy, hep C, anemia, aortic stenosis who presents for evaluation of exertional right-sided chest pain and shortness of breath intermittent since yesterday. Currently, after morphine and aspirin he is chest pain-free. Vital signs are stable, he is afebrile. Clinical picture is concerning for ACS, given exertional chest pain and shortness of breath. Additionally, he has new T-wave inversions on his EKG in the lateral leads which were not seen previously. CTA chest was performed, there is no PE or aortic dissection but there is atherosclerosis including aortic and coronary artery disease. CBC unremarkable. CMP with creatinine elevation at 1.86 which appears near his baseline. Case discussed with the hospitalist, Dr. Doy Hutching for admission at 2:40 PM. ____________________________________________   FINAL CLINICAL IMPRESSION(S) / ED DIAGNOSES  Final diagnoses:  Chest pain, unspecified chest pain type  SOB (shortness of breath)      NEW MEDICATIONS STARTED DURING THIS VISIT:  New Prescriptions   No medications on file     Note:  This document was prepared using Dragon voice recognition software and may include unintentional dictation errors.    Joanne Gavel, MD 11/09/15 [phone]

## 2015-11-09 NOTE — ED Notes (Addendum)
Chest pain that started yesterday pt reports pain in getting worse. Radiates down right arm. Reports SOB and nausea.

## 2015-11-09 NOTE — Care Management Obs Status (Signed)
[place] NOTIFICATION   Patient Details  Name: ZURIEL OHR MRN: [id number] Date of Birth: August 15, 1950   Medicare Observation Status Notification Given:  Yes (chest pain)    Ival Bible, RN 11/09/2015, 3:03 PM

## 2015-11-10 ENCOUNTER — Observation Stay
Admit: 2015-11-10 | Discharge: 2015-11-10 | Disposition: A | Discharge status: Home / Self Care | Payer: Medicare Other | Attending: Internal Medicine | Admitting: Internal Medicine

## 2015-11-10 DIAGNOSIS — R0789 Other chest pain: Secondary | ICD-10-CM

## 2015-11-10 LAB — COMPREHENSIVE METABOLIC PANEL
ALBUMIN: 3.2 g/dL — AB (ref 3.5–5.0)
ALK PHOS: 69 U/L (ref 38–126)
ALT: 47 U/L (ref 17–63)
ANION GAP: 5 (ref 5–15)
AST: 39 U/L (ref 15–41)
BILIRUBIN TOTAL: 0.3 mg/dL (ref 0.3–1.2)
BUN: 19 mg/dL (ref 6–20)
CALCIUM: 8.4 mg/dL — AB (ref 8.9–10.3)
CHLORIDE: 110 mmol/L (ref 101–111)
CO2: 25 mmol/L (ref 22–32)
CREATININE: 1.6 mg/dL — AB (ref 0.61–1.24)
GFR, EST AFRICAN AMERICAN: 51 mL/min — AB (ref >60)
GFR, EST NON AFRICAN AMERICAN: 44 mL/min — AB (ref >60)
Glucose, Bld: 91 mg/dL (ref 65–99)
Potassium: 3.9 mmol/L (ref 3.5–5.1)
Sodium: 140 mmol/L (ref 135–145)
Total Protein: 6.3 g/dL — ABNORMAL LOW (ref 6.5–8.1)

## 2015-11-10 LAB — TROPONIN I: Troponin I: <0.03 ng/mL (ref <0.031)

## 2015-11-10 LAB — CBC
HEMATOCRIT: 37.6 % — AB (ref 40.0–52.0)
HEMOGLOBIN: 12.5 g/dL — AB (ref 13.0–18.0)
MCH: 28.4 pg (ref 26.0–34.0)
MCHC: 33.2 g/dL (ref 32.0–36.0)
MCV: 85.5 fL (ref 80.0–100.0)
Platelets: 113 10*3/uL — ABNORMAL LOW (ref 150–440)
RBC: 4.4 MIL/uL (ref 4.40–5.90)
RDW: 22.4 % — ABNORMAL HIGH (ref 11.5–14.5)
WBC: 6.5 10*3/uL (ref 3.8–10.6)

## 2015-11-10 LAB — TSH: TSH: 1.057 u[IU]/mL (ref 0.350–4.500)

## 2015-11-10 LAB — LIPID PANEL
CHOLESTEROL: 120 mg/dL (ref 0–200)
HDL: 30 mg/dL — ABNORMAL LOW (ref >40)
LDL Cholesterol: 69 mg/dL (ref 0–99)
Total CHOL/HDL Ratio: 4 RATIO
Triglycerides: 107 mg/dL (ref <150)
VLDL: 21 mg/dL (ref 0–40)

## 2015-11-10 MED ORDER — HYDROCODONE-ACETAMINOPHEN 5-325 MG PO TABS
1.0000 | ORAL_TABLET | Freq: Four times a day (QID) | ORAL | Status: DC | PRN
  Administered 2015-11-10 – 2015-11-11 (×4): 1 via ORAL
  Filled 2015-11-10 (×4): qty 1

## 2015-11-10 NOTE — Progress Notes (Addendum)
[facility] [place] NAME: Jared Walker    MR#:  [id number]  DATE OF BIRTH:  [birth year]  SUBJECTIVE:  Patient complaining of right lower extremity edema. He had Dopplers of his lower extremities recently which were negative for DVT. He had chest pain overnight.  REVIEW OF SYSTEMS:    Review of Systems  Constitutional: Negative for fever, chills and malaise/fatigue.  HENT: Negative for ear discharge, ear pain, hearing loss, nosebleeds and sore throat.   Eyes: Negative for blurred vision and pain.  Respiratory: Negative for cough, hemoptysis, shortness of breath and wheezing.   Cardiovascular: Positive for chest pain and leg swelling. Negative for palpitations, claudication and PND.  Gastrointestinal: Negative for nausea, vomiting, abdominal pain, diarrhea and blood in stool.  Genitourinary: Negative for dysuria.  Musculoskeletal: Positive for back pain.  Neurological: Negative for dizziness, tremors, speech change, focal weakness, seizures and headaches.  Endo/Heme/Allergies: Does not bruise/bleed easily.  Psychiatric/Behavioral: Negative for depression, suicidal ideas and hallucinations.    Tolerating Diet:Yes      DRUG ALLERGIES:   Allergies  Allergen Reactions  . Buchu-Cornsilk-Ch Grass-Hydran Other (See Comments)    Gout.  . Colchicine Diarrhea    "Have diarrhea when taken for long periods of time"  . Nifedipine Other (See Comments)    "Unknown"  . Oxycodone-Acetaminophen Other (See Comments)    GI upset.  . Tramadol Other (See Comments)    "unknown"    VITALS:  Blood pressure 171/77, pulse 55, temperature 98 F (36.7 C), temperature source Oral, resp. rate 16, height 5\' 11"  (1.803 m), weight 94.847 kg (209 lb 1.6 oz), SpO2 98 %.  PHYSICAL EXAMINATION:   Physical Exam  Constitutional: He is oriented to person, place, and time and well-developed, well-nourished, and in no distress. No distress.  HENT:  Head:  Normocephalic.  Eyes: No scleral icterus.  Neck: Normal range of motion. Neck supple. No JVD present. No tracheal deviation present.  Cardiovascular: Normal rate and regular rhythm.  Exam reveals no gallop and no friction rub.   Murmur heard. Pulmonary/Chest: Effort normal and breath sounds normal. No respiratory distress. He has no wheezes. He has no rales. He exhibits no tenderness.  Abdominal: Soft. Bowel sounds are normal. He exhibits no distension and no mass. There is no tenderness. There is no rebound and no guarding.  Musculoskeletal: Normal range of motion. He exhibits edema.  Neurological: He is alert and oriented to person, place, and time.  Skin: Skin is warm. No rash noted. No erythema.  Psychiatric: Affect and judgment normal.      LABORATORY PANEL:   CBC  Recent Labs Lab 11/10/15 0559  WBC 6.5  HGB 12.5*  HCT 37.6*  PLT 113*   ------------------------------------------------------------------------------------------------------------------  Chemistries   Recent Labs Lab 11/10/15 0559  NA 140  K 3.9  CL 110  CO2 25  GLUCOSE 91  BUN 19  CREATININE 1.60*  CALCIUM 8.4*  AST 39  ALT 47  ALKPHOS 69  BILITOT 0.3   ------------------------------------------------------------------------------------------------------------------  Cardiac Enzymes  Recent Labs Lab 11/09/15 1807 11/09/15 2352 11/10/15 0559  TROPONINI <0.03 <0.03 <0.03   ------------------------------------------------------------------------------------------------------------------  RADIOLOGY:  Ct Angio Chest Pe W/cm &/or Wo Cm  11/09/2015  CLINICAL DATA:  C/O SOB and CP since yesterday. RLE swelling since last week. No hx CA or surg to chest. HX HIV. TKV EXAM: CT ANGIOGRAPHY CHEST WITH CONTRAST TECHNIQUE: Multidetector CT imaging of the chest was performed using the standard protocol during  bolus administration of intravenous contrast. Multiplanar CT image reconstructions and MIPs  were obtained to evaluate the vascular anatomy. CONTRAST:  75 mL Isovue 370 IV COMPARISON:  None. FINDINGS: Vascular: Left arm IV contrast administration. Innominate vein and SVC are patent. Right atrium and right ventricle are nondilated. Satisfactory opacification of pulmonary arteries noted, and there is no evidence of pulmonary emboli. Patent bilateral pulmonary veins drain into the left atrium. Scattered coronary calcifications. Aortic valve leaflet calcifications. Adequate contrast opacification of the thoracic aorta with no evidence of dissection, aneurysm, or stenosis. There is classic 3-vessel brachiocephalic arch anatomy without proximal stenosis. Mild plaque in the aortic arch and descending segment. Mediastinum/Lymph Nodes: No masses or pathologically enlarged lymph nodes identified. No pericardial effusion. Lungs/Pleura: Mild dependent atelectasis posteriorly in both lower lobes. 4 mm subpleural nodule, superior segment right lower lobe image 79/6. 4 mm nodule adjacent to the right major fissure image 81/6. Lungs otherwise clear. No pleural effusion. No pneumothorax. Upper abdomen: No acute findings. Cholecystectomy clips and coarse calcification in the gallbladder fossa. Musculoskeletal: No chest wall mass or suspicious bone lesions identified. Review of the MIP images confirms the above findings. IMPRESSION: 1. Negative for acute PE or thoracic aortic dissection. 2. Atherosclerosis, including aortic and coronary artery disease. Please note that although the presence of coronary artery calcium documents the presence of coronary artery disease, the severity of this disease and any potential stenosis cannot be assessed on this non-gated CT examination. Assessment for potential risk factor modification, dietary therapy or pharmacologic therapy may be warranted, if clinically indicated. 3. Two subpleural 4 mm right lower lobe pulmonary nodules. No follow-up needed if patient is low-risk (and has no known  or suspected primary neoplasm). Non-contrast chest CT can be considered in 12 months if patient is high-risk. This recommendation follows the consensus statement: Guidelines for Management of Incidental Pulmonary Nodules Detected on CT Images:From the Fleischner Society 2017; published online before print (10.1148/radiol.IJ:2314499). Electronically Signed   By: Lucrezia Europe M.D.   On: 11/09/2015 13:36     ASSESSMENT AND PLAN:   65 year old male with history of chronic kidney disease stage III, HIV and hepatitis C who presents for chest pain.  1. Chest pain: Patient's troponins are negative. EKG changes noted. Telemetry shows no acute changes. Next the patient will undergo stress test in a.m. [place] cardiology consultation. Continue aspirin. Patient has allergies to statin. Check lipid panel  2. Essential hypertension: Continue losartan, clonidine, atenolol  3. Lower extremity edema of right leg: Lower extreme Dopplers were negative in early June. CT scan of the chest showed no pulmonary emboli. Check TSH and UA for protein  4. Heart murmur: Check echocardiogram.  5. HIV: Continue pressure medications.  6. Depression: Continue Zoloft  7. Chronic kidney disease stage III: Creatinine is at baseline.      Management plans discussed with the patient and he is in agreement.  CODE STATUS: full  TOTAL TIME TAKING CARE OF THIS PATIENT: 30 minutes.     POSSIBLE D/C tomorrow, DEPENDING ON CLINICAL CONDITION.   Venita Seng M.D on 11/10/2015 at 11:00 AM  Between 7am to 6pm - Pager - [phone] After 6pm go to [email or web address] - password [email or web address] [facility]  Office  [phone]  CC: Primary care physician; Tracie Harrier, MD  Note: This dictation was prepared with Dragon dictation along with smaller phrase technology. Any transcriptional errors that result from this process are unintentional.

## 2015-11-10 NOTE — Evaluation (Signed)
Physical Therapy Evaluation Patient Details Name: Jared Walker MRN: [id number] DOB: 06-07-1950 Today's Date: 11/10/2015   History of Present Illness  Jared Walker is a 65 y.o. male has a past medical history significant for HTN, CKD, and HIV positivity now with 2 day hx of progressive Chest Pain. Patient had EKG which showed a lateral T wave inversion; Troponins were not elevated; Echo showed normal ejection fraction; Chest CT showed no effusion or emboli; Had recent US in outpatient facilty which was negative for DVT in LEs; Patient also complains of increased RLE edema which has been worsening over 2 weeks; He complains of chronic back pain with RLE pain;   Clinical Impression  65 yo Male admitted with chest pain; Patient's tests have so far come back negative; While in hospital, patient complains of increased swelling in RLE and increased pain down right side; He reports history of chronic back pain; Patient reports being independent in self care ADLs prior to admittance; He lives in a 2 story condo with 13 steps to 2nd floor; Patient's bed/bath is on 2nd floor; Patient is mod I for bed mobility and transfers; He does take longer time to complete tasks; Patient ambulated 125 feet with RW with close supervision demonstrating decreased step length and antalgic gait pattern; He ambulates at significantly slower gait speed; Patient was instructed in stair negotiation; Due to increased RLE pain, PT instructed patient in using LLE more for steps; However despite cues, patient prefers using RLE for most weight bearing with step negotiation; RN notified of pain levels and patient requesting pain meds; Patient would benefit from additional skilled PT intervention to improve balance, gait safety and functional mobility; he does exhibit some weakness in BLE; PT recommends additional PT upon discharge; He would do better with outpatient PT however patient unsure about transportation; If unable to obtain  transportation then [facility] PT would be appropriate.     Follow Up Recommendations Home health PT;Outpatient PT (depending on transportation; Outpatient would be better but patient unsure if he has transportation; )    Equipment Recommendations  None recommended by PT    Recommendations for Other Services Rehab consult     Precautions / Restrictions Precautions Precautions: None Restrictions Weight Bearing Restrictions: No      Mobility  Bed Mobility Overal bed mobility: Modified Independent             General bed mobility comments: used elevated head of bed and bedrails;   Transfers Overall transfer level: Modified independent               General transfer comment: Patient slow with sit<>Stand transfers, but is able to stand without AD; demonstrates good safety awareness with hand placement;   Ambulation/Gait Ambulation/Gait assistance: Supervision Ambulation Distance (Feet): 125 Feet Assistive device: Rolling walker (2 wheeled) Gait Pattern/deviations: Step-to pattern;Decreased step length - left;Decreased stance time - right;Shuffle;Narrow base of support;Trunk flexed Gait velocity: 10 feet gait speed: 0.7 ft/sec Gait velocity interpretation: <1.8 ft/sec, indicative of risk for recurrent falls General Gait Details: Patient exhibits increased weight bearing through BUE due to RLE pain; He is very slow with gait speed; demonstrates antalgic gait pattern on right side with decreased step length left; Patient required cues to increase push with RW and avoid having to pick up RW for better 2 point gait pattern as compared to 3 point gait pattern;   Stairs Stairs: Yes Stairs assistance: Supervision Stair Management: One rail Left;Step to pattern;Alternating pattern Number of Stairs: 7 General  stair comments: Patient instructed in using LLE for ascend/descend stairs; however he prefers to put more weight through RLE despite cues; Patient is slower with step negotiation  will negotiate reciprocal and non-reciprocal;  (8 min) Wheelchair Mobility    Modified Rankin (Stroke Patients Only)       Balance Overall balance assessment: Needs assistance Sitting-balance support: No upper extremity supported;Feet supported Sitting balance-Leahy Scale: Normal     Standing balance support: Single extremity supported Standing balance-Leahy Scale: Fair Standing balance comment: Patient able to stand statically without rail assist with good balance; however dynamically he requires at least 1 rail assist due to high pain levels;                              Pertinent Vitals/Pain Pain Assessment: 0-10 Pain Score: 9  Pain Location: RLE pain; chest pain Pain Descriptors / Indicators: Burning;Sore;Throbbing Pain Intervention(s): Limited activity within patient's tolerance;Monitored during session;Repositioned;Patient requesting pain meds-RN notified    Home Living Family/patient expects to be discharged to:: Private residence Living Arrangements: Spouse/significant other;Children Available Help at Discharge: Family;Available 24 hours/day Type of Home:  (condo) Home Access: Stairs to enter Entrance Stairs-Rails: Right Entrance Stairs-Number of Steps: 3 Home Layout: Two level Home Equipment: Walker - 2 wheels;Cane - single point;Grab bars - tub/shower      Prior Function Level of Independence: Independent         Comments: reports not using any AD for gait tasks prior to admittance; was independent in bathing/dressing;      Hand Dominance   Dominant Hand: Right    Extremity/Trunk Assessment   Upper Extremity Assessment: Overall WFL for tasks assessed           Lower Extremity Assessment: Overall WFL for tasks assessed;RLE deficits/detail;LLE deficits/detail RLE Deficits / Details: does demonstrate decreased RLE AROM due to pain; however hip grossly 4-/5, knee 3-/5, ankle 3/5; reports tingling in foot; however light touch sensation is  present; intact deep pressure;  LLE Deficits / Details: hip/knee and ankle are 4+/5  Cervical / Trunk Assessment: Normal  Communication   Communication: No difficulties  Cognition Arousal/Alertness: Awake/alert Behavior During Therapy: WFL for tasks assessed/performed Overall Cognitive Status: Within Functional Limits for tasks assessed                      General Comments General comments (skin integrity, edema, etc.): slight increased swelling in RLE as compared to LLE;     Exercises        Assessment/Plan    PT Assessment Patient needs continued PT services  PT Diagnosis Difficulty walking;Generalized weakness   PT Problem List Decreased strength;Decreased range of motion;Decreased activity tolerance;Decreased balance;Decreased mobility;Decreased knowledge of use of DME;Pain  PT Treatment Interventions DME instruction;Stair training;Gait training;Functional mobility training;Therapeutic activities;Therapeutic exercise;Patient/family education;Balance training   PT Goals (Current goals can be found in the Care Plan section) Acute Rehab PT Goals Patient Stated Goal: "I want my pain to go away." PT Goal Formulation: With patient Time For Goal Achievement: 11/24/15 Potential to Achieve Goals: Fair    Frequency Min 2X/week   Barriers to discharge Inaccessible home environment has 13 steps to get to bed/bath upstairs with B rails;     Co-evaluation               End of Session Equipment Utilized During Treatment: Gait belt Activity Tolerance: Patient limited by pain Patient left: in chair;with call bell/phone within reach;with  family/visitor present Nurse Communication: Mobility status;Patient requests pain meds    Functional Assessment Tool Used: 10 feet walk speed; clinical judgement Functional Limitation: Mobility: Walking and moving around Mobility: Walking and Moving Around Current Status [phone]): At least 40 percent but less than 60 percent impaired,  limited or restricted Mobility: Walking and Moving Around Goal Status [phone]): At least 1 percent but less than 20 percent impaired, limited or restricted    Time: 1358-1430 PT Time Calculation (min) (ACUTE ONLY): 32 min   Charges:   PT Evaluation $PT Eval Low Complexity: 1 Procedure PT Treatments $Gait Training: 8-22 mins   PT G Codes:   PT G-Codes **NOT FOR INPATIENT CLASS** Functional Assessment Tool Used: 10 feet walk speed; clinical judgement Functional Limitation: Mobility: Walking and moving around Mobility: Walking and Moving Around Current Status [id number]): At least 40 percent but less than 60 percent impaired, limited or restricted Mobility: Walking and Moving Around Goal Status [phone]): At least 1 percent but less than 20 percent impaired, limited or restricted    Aliea Bobe PT, DPT 11/10/2015, 2:43 PM

## 2015-11-10 NOTE — Plan of Care (Signed)
Problem: Safety: Goal: Ability to remain free from injury will improve Outcome: Progressing Patient has periods of confusion bed alarm has been applied. Family educated about precautions.  Problem: Activity: Goal: Risk for activity intolerance will decrease Outcome: Progressing Patient has ambulated int the hall without any complications with a walker.

## 2015-11-10 NOTE — Consult Note (Signed)
[name]  CARDIOLOGY CONSULT NOTE  Patient ID: Jared Walker MRN: [id number] DOB/AGE: 1951-02-29 65 y.o.  Admit date: 11/09/2015 Referring Physician Dr. Benjie Karvonen Primary Physician Dr. Ginette Pitman Primary Cardiologist   Reason for Consultation :  Atypical chest pain  HPI: Pt is a 65 yo male with history of mild aortic insuffiency by echo 4/17 showing normal ef of greater than 55%, with no reported aortic stenosis or significant valve abnormalities but estimated rvsp of 53.73mmHg c/w moderate pulmonary hypertension who was admitted with sob and intermitant brief stabbing chest pains with pleuritic component. He has ruled out for an mi. Chest ct revealed no evidence of pericardia effusion, and no evidence of pulmonary emboli. EKG showed sinus bradycardia with no ischemia. He had an outpatinet venous ultrasound as outpatient last week for lower extremity edema which showed no evidence of dvt.  He has been on clonidine, chlorthalidone, lisinopril and atenolol for bp at home. Beta blockers have been held due to bradycardia.   Review of Systems  Constitutional: Negative.   HENT: Negative.   Eyes: Negative.   Respiratory: Positive for shortness of breath.   Cardiovascular: Positive for chest pain.  Gastrointestinal: Negative.   Genitourinary: Negative.   Musculoskeletal: Negative.   Skin: Negative.   Neurological: Negative.   Endo/Heme/Allergies: Negative.   Psychiatric/Behavioral: Negative.     Past Medical History  Diagnosis Date  . Gout   . Hypertension   . Heart murmur   . Aortic stenosis, mild     mild AS by echo, 08/2011  . Chronic kidney disease     kidney stones  . HIV positive ([place]) 09/25/11  . Hepatitis C 09/25/11  . Full dentures     upper and lower  . Hep C w/o coma, chronic ([place]) 01/19/2015  . Anemia   . Depression     Family History  Problem Relation Age of Onset  . Anesthesia problems Neg Hx   . Hypertension Mother   . Cancer  Father   . Heart disease Sister   . Diabetes Brother     Social History   Social History  . Marital Status: Married    Spouse Name: N/A  . Number of Children: N/A  . Years of Education: N/A   Occupational History  . Not on file.   Social History Main Topics  . Smoking status: Former Research scientist (life sciences)  . Smokeless tobacco: Former Systems developer    Quit date: 03/13/1972  . Alcohol Use: No  . Drug Use: No  . Sexual Activity: Not on file   Other Topics Concern  . Not on file   Social History Narrative    Past Surgical History  Procedure Laterality Date  . Cholecystectomy    . Closed reduction pelvic fracture    . Cardiovascular stress test      at [facility]  . Knee arthroscopy  2009    Right  . Nephrostomy      tube Left  . Lumbar laminectomy/decompression microdiscectomy  09/25/2011    Procedure: LUMBAR LAMINECTOMY/DECOMPRESSION MICRODISCECTOMY;  Surgeon: Erline Levine, MD;  Location: [facility];  Service: Neurosurgery;  Laterality: Left;  Left Lumbar Four-Five Microdiskectomy  . Shoulder arthroscopy with open rotator cuff repair Right 10/12/2014    Procedure: SHOULDER ARTHROSCOP with decompression;  Surgeon: Leanor Kail, MD;  Location: [place];  Service: Orthopedics;  Laterality: Right;  . Fracture surgery    . Back surgery    . Esophagogastroduodenoscopy (egd) with propofol N/A 01/21/2015  Procedure: ESOPHAGOGASTRODUODENOSCOPY (EGD) WITH PROPOFOL;  Surgeon: Manya Silvas, MD;  Location: [facility] ENDOSCOPY;  Service: Endoscopy;  Laterality: N/A;  . Esophagogastroduodenoscopy (egd) with propofol N/A 09/04/2015    Procedure: ESOPHAGOGASTRODUODENOSCOPY (EGD) WITH PROPOFOL;  Surgeon: Manya Silvas, MD;  Location: [facility] ENDOSCOPY;  Service: Endoscopy;  Laterality: N/A;     Prescriptions prior to admission  Medication Sig Dispense Refill Last Dose  . allopurinol (ZYLOPRIM) 100 MG tablet Take 200 mg by mouth daily. *Take along with 300 mg tablet to equal total dose of 500 mg daily.*    11/09/2015 at Unknown time  . allopurinol (ZYLOPRIM) 300 MG tablet Take 300 mg by mouth See admin instructions. *Take along with 2 tablets of 100 mg (200 mg) to equal total dose of 500 mg by mouth daily*   11/09/2015 at Unknown time  . atenolol (TENORMIN) 100 MG tablet Take 100 mg by mouth every evening.    11/08/2015 at Unknown time  . cephALEXin (KEFLEX) 500 MG capsule Take 500 mg by mouth 3 (three) times daily. X 10 days.  0 11/09/2015 at Unknown time  . Cholecalciferol (VITAMIN D-1000 MAX ST) 1000 units tablet Take 1,000 Units by mouth daily.   11/09/2015 at Unknown time  . cloNIDine (CATAPRES) 0.1 MG tablet Take 0.1 mg by mouth at bedtime.    11/08/2015 at Unknown time  . colchicine 0.6 MG tablet Take 0.6 mg by mouth daily as needed (gout flare up.).    unknown  . Cyanocobalamin (RA VITAMIN B-12 TR) 1000 MCG TBCR Take 1,000 mcg by mouth daily.   11/09/2015 at Unknown time  . DESCOVY 200-25 MG tablet Take 1 tablet by mouth at bedtime.   3 11/08/2015 at Unknown time  . diazepam (VALIUM) 5 MG tablet Take 5 mg by mouth at bedtime. For sleep   11/08/2015 at Unknown time  . docusate sodium (COLACE) 100 MG capsule Take 100 mg by mouth at bedtime.    11/08/2015 at Unknown time  . dolutegravir (TIVICAY) 50 MG tablet Take 50 mg by mouth at bedtime.    11/08/2015 at Unknown time  . esomeprazole (NEXIUM) 40 MG capsule Take 1 capsule (40 mg total) by mouth 2 (two) times daily before a meal. AM (Patient taking differently: Take 40 mg by mouth every evening. AM) 60 capsule 3 11/08/2015 at Unknown time  . ferrous sulfate 325 (65 FE) MG tablet Take 325 mg by mouth 2 (two) times daily.   11/09/2015 at Unknown time  . folic acid (FOLVITE) 1 MG tablet Take 1 mg by mouth every evening.    11/08/2015 at Unknown time  . HYDROcodone-acetaminophen (NORCO/VICODIN) 5-325 MG tablet Take 1 tablet by mouth See admin instructions. Take 1 tablet by mouth at bedtime if needed for pain, and Take 1 tablet by mouth 30 min before physical therapy.  0  11/09/2015 at Unknown time  . losartan (COZAAR) 100 MG tablet Take 100 mg by mouth every morning.   0 11/09/2015 at Unknown time  . nystatin (MYCOSTATIN) 100000 UNIT/ML suspension Take 5 mLs (500,000 Units total) by mouth 4 (four) times daily. (Patient taking differently: Take 5 mLs by mouth 4 (four) times daily as needed. For throat pain.) 60 mL 0 Past Month at Unknown time  . omeprazole (PRILOSEC) 40 MG capsule Take 40 mg by mouth every morning.   0 11/09/2015 at Unknown time  . sertraline (ZOLOFT) 50 MG tablet Take 50 mg by mouth every evening.    11/08/2015 at Unknown time  Physical Exam: Blood pressure 171/77, pulse 55, temperature 98 F (36.7 C), temperature source Oral, resp. rate 16, height 5\' 11"  (1.803 m), weight 94.847 kg (209 lb 1.6 oz), SpO2 98 %.   Wt Readings from Last 1 Encounters:  11/10/15 94.847 kg (209 lb 1.6 oz)     General appearance: alert and cooperative Head: Normocephalic, without obvious abnormality, atraumatic Resp: clear to auscultation bilaterally Chest wall: right sided chest wall tenderness Cardio: regular rate and rhythm GI: soft, non-tender; bowel sounds normal; no masses,  no organomegaly Extremities: edema 2+ edema bilaterally in lower extremeties.  Pulses: 2+ and symmetric Neurologic: Grossly normal  Labs:   Lab Results  Component Value Date   WBC 6.5 11/10/2015   HGB 12.5* 11/10/2015   HCT 37.6* 11/10/2015   MCV 85.5 11/10/2015   PLT 113* 11/10/2015    Recent Labs Lab 11/10/15 0559  NA 140  K 3.9  CL 110  CO2 25  BUN 19  CREATININE 1.60*  CALCIUM 8.4*  PROT 6.3*  BILITOT 0.3  ALKPHOS 69  ALT 47  AST 39  GLUCOSE 91   Lab Results  Component Value Date   CKTOTAL 88 11/12/2011   CKMB 1.3 11/12/2011   TROPONINI <0.03 11/10/2015       EKG: nsr with non specific st t wave changes c/w probable lvh  ASSESSMENT AND PLAN:  65 yo male with no prior cardiac history admitted with chest paiin with atypcial features. Pain is somewhat  pleuritic at times but chest ct negative for pe. Echo in 4/17 showed normal lv funciton with moderate pulmonary hypertension. Is on aggerssive antihypertensive regimen at home including clonidine, atenolol, chlorthalidone and ace inhibitor. Somewhat bradycardic but likely this is due to atenolol and clonidine. Has ruled out for mi. Will proceed with lexiscan stress in am to evaluate for evidence of ischemic etiology of symptoms. Continue with asa, chlorthalidone, clonidine, cardura, losartan and hydralazine for hypertension. Further recs after functional study . Signed: Teodoro Spray MD, [facility] 11/10/2015, 8:29 AM

## 2015-11-10 NOTE — Progress Notes (Signed)
MD notified to resume patients home pain medication. Orders received I will continue to assess.

## 2015-11-10 NOTE — Progress Notes (Signed)
We were asked to see this pt, but Dr Ubaldo Glassing has seen We will be available as needed Thanks

## 2015-11-11 ENCOUNTER — Encounter: Payer: Self-pay | Admitting: Radiology

## 2015-11-11 DIAGNOSIS — R0789 Other chest pain: Secondary | ICD-10-CM | POA: Diagnosis not present

## 2015-11-11 LAB — URINALYSIS COMPLETE WITH MICROSCOPIC (ARMC ONLY)
BACTERIA UA: NONE SEEN
BILIRUBIN URINE: NEGATIVE
GLUCOSE, UA: NEGATIVE mg/dL
Hgb urine dipstick: NEGATIVE
Ketones, ur: NEGATIVE mg/dL
Leukocytes, UA: NEGATIVE
Nitrite: NEGATIVE
PH: 5 (ref 5.0–8.0)
Protein, ur: NEGATIVE mg/dL
SQUAMOUS EPITHELIAL / LPF: NONE SEEN
Specific Gravity, Urine: 1.015 (ref 1.005–1.030)

## 2015-11-11 LAB — ECHOCARDIOGRAM COMPLETE
AO mean calculated velocity dopler: 172 cm/s
AOVTI: 51.9 cm
AV Area VTI index: 1.12 cm2/m2
AV Area VTI: 2.11 cm2
AV Area mean vel: 2.07 cm2
AV area mean vel ind: 0.94 cm2/m2
AV peak Index: 0.96
AV vel: 2.47
AVA: 2.47 cm2
AVCELMEANRAT: 0.55
AVG: 14 mmHg
AVLVOTPG: 8 mmHg
AVPG: 25 mmHg
AVPKVEL: 252 cm/s
Ao pk vel: 0.56 m/s
CHL CUP DOP CALC LVOT VTI: 33.7 cm
CHL CUP MV DEC (S): 387
E decel time: 387 msec
EERAT: 16.12
FS: 36 % (ref 28–44)
HEIGHTINCHES: 71 in
IV/PV OW: 1.1
LA ID, A-P, ES: 46 mm
LA diam index: 2.09 cm/m2
LA vol index: 36.5 mL/m2
LA vol: 80.3 mL
LAVOLA4C: 77.6 mL
LEFT ATRIUM END SYS DIAM: 46 mm
LV PW d: 14.3 mm — AB (ref 0.6–1.1)
LV TDI E'LATERAL: 3.76
LV TDI E'MEDIAL: 3.76
LV e' LATERAL: 3.76 cm/s
LVEEAVG: 16.12
LVEEMED: 16.12
LVOT area: 3.8 cm2
LVOT diameter: 22 mm
LVOT peak VTI: 0.65 cm
LVOT peak vel: 140 cm/s
LVOTSV: 128 mL
Lateral S' vel: 15.8 cm/s
MV pk A vel: 85.1 m/s
MV pk E vel: 60.6 m/s
TAPSE: 23.5 mm
Valve area index: 1.12
Weight: 3345.6 oz

## 2015-11-11 LAB — NM MYOCAR MULTI W/SPECT W/WALL MOTION / EF
CSEPEW: 1 METS
CSEPPHR: 118 {beats}/min
Exercise duration (min): 1 min
Exercise duration (sec): 3 s
LVDIAVOL: 133 mL (ref 62–150)
LVSYSVOL: 50 mL
Rest HR: 56 {beats}/min
SDS: 0
SRS: 0
SSS: 0
TID: 0.9

## 2015-11-11 LAB — BASIC METABOLIC PANEL
ANION GAP: 5 (ref 5–15)
BUN: 20 mg/dL (ref 6–20)
CALCIUM: 8.8 mg/dL — AB (ref 8.9–10.3)
CO2: 25 mmol/L (ref 22–32)
Chloride: 109 mmol/L (ref 101–111)
Creatinine, Ser: 1.59 mg/dL — ABNORMAL HIGH (ref 0.61–1.24)
GFR, EST AFRICAN AMERICAN: 52 mL/min — AB (ref >60)
GFR, EST NON AFRICAN AMERICAN: 45 mL/min — AB (ref >60)
Glucose, Bld: 134 mg/dL — ABNORMAL HIGH (ref 65–99)
Potassium: 3.7 mmol/L (ref 3.5–5.1)
SODIUM: 139 mmol/L (ref 135–145)

## 2015-11-11 MED ORDER — ASPIRIN 81 MG PO TBEC: 81.0000 mg | DELAYED_RELEASE_TABLET | Freq: Every day | ORAL | Status: AC

## 2015-11-11 MED ORDER — REGADENOSON 0.4 MG/5ML IV SOLN
0.4000 mg | Freq: Once | INTRAVENOUS | Status: AC
  Administered 2015-11-11: 0.4 mg via INTRAVENOUS

## 2015-11-11 MED ORDER — TECHNETIUM TC 99M TETROFOSMIN IV KIT
28.2600 | PACK | Freq: Once | INTRAVENOUS | Status: AC | PRN
  Administered 2015-11-11: 28.26 via INTRAVENOUS

## 2015-11-11 MED ORDER — TECHNETIUM TC 99M TETROFOSMIN IV KIT
12.9100 | PACK | Freq: Once | INTRAVENOUS | Status: AC | PRN
  Administered 2015-11-11: 12.91 via INTRAVENOUS

## 2015-11-11 NOTE — Progress Notes (Signed)
Patient discharge home to self care, discharge instruction provided, iv removed, patient discharge home as per. Patient to follow up with primary care physician

## 2015-11-11 NOTE — Progress Notes (Signed)
Patient returned to room from [facility], patient tolerated, denies any pain at this time, no distress noted

## 2015-11-11 NOTE — Care Management (Addendum)
Patient presented from home with chest pain.  Placed in observation and for functional study today.  Physical therapy has recommended further therapy- home health "if does not have transportation."  Patient is not homebound and discussed must meet homebound criteria to receive home health.  Provided patient with information on dial a ride.  Patient denies any issues with transportation.

## 2015-11-11 NOTE — Progress Notes (Signed)
[place]  SUBJECTIVE: sob with funcitonal study. Myoveiw was normal. Low risk for ischemia   Filed Vitals:   11/10/15 1201 11/10/15 1929 11/11/15 0458 11/11/15 1140  BP: 145/70 173/87 180/91 158/82  Pulse: 61 66 76 65  Temp: 97.8 F (36.6 C) 98.4 F (36.9 C) 98.1 F (36.7 C) 98.5 F (36.9 C)  TempSrc: Oral Oral Oral Oral  Resp: 18 18 18 18   Height:      Weight:   93.033 kg (205 lb 1.6 oz)   SpO2: 99% 96% 95% 100%    Intake/Output Summary (Last 24 hours) at 11/11/15 1328 Last data filed at 11/11/15 0847  Gross per 24 hour  Intake    360 ml  Output   1100 ml  Net   -740 ml    LABS: Basic Metabolic Panel:  Recent Labs  11/10/15 0559 11/11/15 0317  NA 140 139  K 3.9 3.7  CL 110 109  CO2 25 25  GLUCOSE 91 134*  BUN 19 20  CREATININE 1.60* 1.59*  CALCIUM 8.4* 8.8*   Liver Function Tests:  Recent Labs  11/09/15 1205 11/10/15 0559  AST 42* 39  ALT 48 47  ALKPHOS 74 69  BILITOT 0.4 0.3  PROT 7.3 6.3*  ALBUMIN 3.8 3.2*   No results for input(s): LIPASE, AMYLASE in the last 72 hours. CBC:  Recent Labs  11/09/15 1205 11/10/15 0559  WBC 8.2 6.5  NEUTROABS 6.3  --   HGB 13.9 12.5*  HCT 43.8 37.6*  MCV 85.8 85.5  PLT 129* 113*   Cardiac Enzymes:  Recent Labs  11/09/15 1807 11/09/15 2352 11/10/15 0559  TROPONINI <0.03 <0.03 <0.03   BNP: Invalid input(s): POCBNP D-Dimer: No results for input(s): DDIMER in the last 72 hours. Hemoglobin A1C: No results for input(s): HGBA1C in the last 72 hours. Fasting Lipid Panel:  Recent Labs  11/10/15 0559  CHOL 120  HDL 30*  LDLCALC 69  TRIG 107  CHOLHDL 4.0   Thyroid Function Tests:  Recent Labs  11/10/15 0559  TSH 1.057   Anemia Panel: No results for input(s): VITAMINB12, FOLATE, FERRITIN, TIBC, IRON, RETICCTPCT in the last 72 hours.   Physical Exam: Blood pressure 158/82, pulse 65, temperature 98.5 F (36.9 C), temperature source Oral, resp. rate  18, height 5\' 11"  (1.803 m), weight 93.033 kg (205 lb 1.6 oz), SpO2 100 %.   Wt Readings from Last 1 Encounters:  11/11/15 93.033 kg (205 lb 1.6 oz)     General appearance: alert and cooperative Resp: clear to auscultation bilaterally Cardio: regular rate and rhythm and systolic murmur: early systolic 2/6, crescendo and decrescendo at lower left sternal border GI: soft, non-tender; bowel sounds normal; no masses,  no organomegaly Neurologic: Grossly normal  TELEMETRY: Reviewed telemetry pt in nsr:  ASSESSMENT AND PLAN:  Principal Problem:   Atypical chest pain-ruled ot for mi. Stress test normal with no ischemia. No further cardiac workup in hospital indicated.  Continue with clonidine, chlortalidone,  Losartan and hydralazine and discharge. Will follow as outpatient if desired.  Active Problems:   HIV (human immunodeficiency virus infection) ([place])   Bradycardia   Abnormal EKG    Teodoro Spray., MD, [facility] 11/11/2015 1:28 PM

## 2015-11-11 NOTE — Progress Notes (Signed)
[facility] [place] NAME: Jared Walker    MR#:  [id number]  DATE OF BIRTH:  1950/07/14  SUBJECTIVE:  Patient Underwent stress test this morning. Patient is not complaining shortness of breath. Patient did have some shortness of breath during the procedure. REVIEW OF SYSTEMS:    Review of Systems  Constitutional: Negative for fever, chills and malaise/fatigue.  HENT: Negative for ear discharge, ear pain, hearing loss, nosebleeds and sore throat.   Eyes: Negative for blurred vision and pain.  Respiratory: Negative for cough, hemoptysis, shortness of breath and wheezing.   Cardiovascular: Positive for leg swelling. Negative for palpitations, claudication and PND.  Gastrointestinal: Negative for nausea, vomiting, abdominal pain, diarrhea and blood in stool.  Genitourinary: Negative for dysuria.  Neurological: Negative for dizziness, tremors, speech change, focal weakness, seizures and headaches.  Endo/Heme/Allergies: Does not bruise/bleed easily.  Psychiatric/Behavioral: Negative for depression, suicidal ideas and hallucinations.    Tolerating Diet:Yes      DRUG ALLERGIES:   Allergies  Allergen Reactions  . Buchu-Cornsilk-Ch Grass-Hydran Other (See Comments)    Gout.  . Colchicine Diarrhea    "Have diarrhea when taken for long periods of time"  . Nifedipine Other (See Comments)    "Unknown"  . Oxycodone-Acetaminophen Other (See Comments)    GI upset.  . Tramadol Other (See Comments)    "unknown"    VITALS:  Blood pressure 158/82, pulse 65, temperature 98.5 F (36.9 C), temperature source Oral, resp. rate 18, height 5\' 11"  (1.803 m), weight 93.033 kg (205 lb 1.6 oz), SpO2 100 %.  PHYSICAL EXAMINATION:   Physical Exam  Constitutional: He is oriented to person, place, and time and well-developed, well-nourished, and in no distress. No distress.  HENT:  Head: Normocephalic.  Eyes: No scleral icterus.  Neck: Normal range of  motion. Neck supple. No JVD present. No tracheal deviation present.  Cardiovascular: Normal rate and regular rhythm.  Exam reveals no gallop and no friction rub.   Murmur heard. Pulmonary/Chest: Effort normal and breath sounds normal. No respiratory distress. He has no wheezes. He has no rales. He exhibits no tenderness.  Abdominal: Soft. Bowel sounds are normal. He exhibits no distension and no mass. There is no tenderness. There is no rebound and no guarding.  Musculoskeletal: Normal range of motion. He exhibits edema.  Neurological: He is alert and oriented to person, place, and time.  Skin: Skin is warm. No rash noted. No erythema.  Psychiatric: Affect and judgment normal.      LABORATORY PANEL:   CBC  Recent Labs Lab 11/10/15 0559  WBC 6.5  HGB 12.5*  HCT 37.6*  PLT 113*   ------------------------------------------------------------------------------------------------------------------  Chemistries   Recent Labs Lab 11/10/15 0559 11/11/15 0317  NA 140 139  K 3.9 3.7  CL 110 109  CO2 25 25  GLUCOSE 91 134*  BUN 19 20  CREATININE 1.60* 1.59*  CALCIUM 8.4* 8.8*  AST 39  --   ALT 47  --   ALKPHOS 69  --   BILITOT 0.3  --    ------------------------------------------------------------------------------------------------------------------  Cardiac Enzymes  Recent Labs Lab 11/09/15 1807 11/09/15 2352 11/10/15 0559  TROPONINI <0.03 <0.03 <0.03   ------------------------------------------------------------------------------------------------------------------  RADIOLOGY:  Ct Angio Chest Pe W/cm &/or Wo Cm  11/09/2015  CLINICAL DATA:  C/O SOB and CP since yesterday. RLE swelling since last week. No hx CA or surg to chest. HX HIV. TKV EXAM: CT ANGIOGRAPHY CHEST WITH CONTRAST TECHNIQUE: Multidetector CT imaging  of the chest was performed using the standard protocol during bolus administration of intravenous contrast. Multiplanar CT image reconstructions and MIPs  were obtained to evaluate the vascular anatomy. CONTRAST:  75 mL Isovue 370 IV COMPARISON:  None. FINDINGS: Vascular: Left arm IV contrast administration. Innominate vein and SVC are patent. Right atrium and right ventricle are nondilated. Satisfactory opacification of pulmonary arteries noted, and there is no evidence of pulmonary emboli. Patent bilateral pulmonary veins drain into the left atrium. Scattered coronary calcifications. Aortic valve leaflet calcifications. Adequate contrast opacification of the thoracic aorta with no evidence of dissection, aneurysm, or stenosis. There is classic 3-vessel brachiocephalic arch anatomy without proximal stenosis. Mild plaque in the aortic arch and descending segment. Mediastinum/Lymph Nodes: No masses or pathologically enlarged lymph nodes identified. No pericardial effusion. Lungs/Pleura: Mild dependent atelectasis posteriorly in both lower lobes. 4 mm subpleural nodule, superior segment right lower lobe image 79/6. 4 mm nodule adjacent to the right major fissure image 81/6. Lungs otherwise clear. No pleural effusion. No pneumothorax. Upper abdomen: No acute findings. Cholecystectomy clips and coarse calcification in the gallbladder fossa. Musculoskeletal: No chest wall mass or suspicious bone lesions identified. Review of the MIP images confirms the above findings. IMPRESSION: 1. Negative for acute PE or thoracic aortic dissection. 2. Atherosclerosis, including aortic and coronary artery disease. Please note that although the presence of coronary artery calcium documents the presence of coronary artery disease, the severity of this disease and any potential stenosis cannot be assessed on this non-gated CT examination. Assessment for potential risk factor modification, dietary therapy or pharmacologic therapy may be warranted, if clinically indicated. 3. Two subpleural 4 mm right lower lobe pulmonary nodules. No follow-up needed if patient is low-risk (and has no known  or suspected primary neoplasm). Non-contrast chest CT can be considered in 12 months if patient is high-risk. This recommendation follows the consensus statement: Guidelines for Management of Incidental Pulmonary Nodules Detected on CT Images:From the Fleischner Society 2017; published online before print (10.1148/radiol.IJ:2314499). Electronically Signed   By: Lucrezia Europe M.D.   On: 11/09/2015 13:36     ASSESSMENT AND PLAN:   65 year old male with history of chronic kidney disease stage III, HIV and hepatitis C who presents for chest pain.  1. Chest pain: Follow-up on stress test. If positive patient will need to undergo cardiac catheterization.  Continue aspirin. Patient is allergic to statins. LDL 69 at goal.  2. Essential hypertension: Continue losartan, clonidine, atenolol Blood pressure  elevated after procedure. Continue to monitor. 3. Lower extremity edema of right leg: Lower extreme Dopplers were negative in early June. CT scan of the chest showed no pulmonary emboli. [place] normal Urine analysis did not show protein.  4. Heart murmur:echocardiogram showed normal ejection fraction with diastolic dysfunction. He does have mild tricuspid regurgitation.  5. HIV: Continue HIV medications.  6. Depression: Continue Zoloft  7. Chronic kidney disease stage III: Creatinine is at baseline.      Management plans discussed with the patient and he is in agreement.  CODE STATUS: full  TOTAL TIME TAKING CARE OF THIS PATIENT: 25 minutes.     POSSIBLE D/C today or tomorrow DEPENDING ON stress test results   Remmington Teters M.D on 11/11/2015 at 11:59 AM  Between 7am to 6pm - Pager - [phone] After 6pm go to [email or web address] - password [email or web address] [facility]  Office  [phone]  CC: Primary care physician; Tracie Harrier, MD  Note: This dictation was prepared with Dragon dictation along with smaller  Company secretary. Any transcriptional errors that result from  this process are unintentional.

## 2015-11-11 NOTE — Discharge Summary (Signed)
[facility] [place] NAME: Jared Walker    MR#:  [id number]  DATE OF BIRTH:  Apr 18, 1951  DATE OF ADMISSION:  11/09/2015 ADMITTING PHYSICIAN: Idelle Crouch, MD  DATE OF DISCHARGE: 11/11/2015  PRIMARY CARE PHYSICIAN: Tracie Harrier, MD    ADMISSION DIAGNOSIS:  SOB (shortness of breath) [R06.02] Chest pain, unspecified chest pain type [R07.9]  DISCHARGE DIAGNOSIS:  Principal Problem:   Atypical chest pain Active Problems:   HIV (human immunodeficiency virus infection) ([place])   Bradycardia   Abnormal EKG   SECONDARY DIAGNOSIS:   Past Medical History  Diagnosis Date  . Gout   . Hypertension   . Heart murmur   . Aortic stenosis, mild     mild AS by echo, 08/2011  . Chronic kidney disease     kidney stones  . HIV positive ([place]) 09/25/11  . Hepatitis C 09/25/11  . Full dentures     upper and lower  . Hep C w/o coma, chronic ([place]) 01/19/2015  . Anemia   . Depression     HOSPITAL COURSE:   65 year old male with history of chronic kidney disease stage III, HIV and hepatitis C who presents for chest pain.  1. Chest pain: Stress test was low risk. Continue aspirin. Patient is allergic to statins. LDL 69 at goal.  2. Essential hypertension: Continue losartan, clonidine, atenolol Blood pressure elevated after procedure. Continue to monitor.  3. Lower extremity edema of right leg: Lower extreme Dopplers were negative in early June. CT scan of the chest showed no pulmonary emboli. TSH normal Urine analysis did not show protein.  4. Heart murmur:echocardiogram showed normal ejection fraction with diastolic dysfunction. He does have mild tricuspid regurgitation.  5. HIV: Continue HIV medications.  6. Depression: Continue Zoloft  7. Chronic kidney disease stage III: Creatinine is at baseline.  8. Pulmonary nodule seen on CT scan: Patient will need follow up CT scan in 6-12 months.   DISCHARGE CONDITIONS AND DIET:    Stable regular diet  CONSULTS OBTAINED:  Treatment Team:  Teodoro Spray, MD  DRUG ALLERGIES:   Allergies  Allergen Reactions  . Buchu-Cornsilk-Ch Grass-Hydran Other (See Comments)    Gout.  . Colchicine Diarrhea    "Have diarrhea when taken for long periods of time"  . Nifedipine Other (See Comments)    "Unknown"  . Oxycodone-Acetaminophen Other (See Comments)    GI upset.  . Tramadol Other (See Comments)    "unknown"    DISCHARGE MEDICATIONS:   Current Discharge Medication List    START taking these medications   Details  aspirin EC 81 MG EC tablet Take 1 tablet (81 mg total) by mouth daily. Qty: 120 tablet, Refills: 0      CONTINUE these medications which have NOT CHANGED   Details  !! allopurinol (ZYLOPRIM) 100 MG tablet Take 200 mg by mouth daily. *Take along with 300 mg tablet to equal total dose of 500 mg daily.*    !! allopurinol (ZYLOPRIM) 300 MG tablet Take 300 mg by mouth See admin instructions. *Take along with 2 tablets of 100 mg (200 mg) to equal total dose of 500 mg by mouth daily*    atenolol (TENORMIN) 100 MG tablet Take 100 mg by mouth every evening.     Cholecalciferol (VITAMIN D-1000 MAX ST) 1000 units tablet Take 1,000 Units by mouth daily.    cloNIDine (CATAPRES) 0.1 MG tablet Take 0.1 mg by mouth at bedtime.     Cyanocobalamin (  RA VITAMIN B-12 TR) 1000 MCG TBCR Take 1,000 mcg by mouth daily.    DESCOVY 200-25 MG tablet Take 1 tablet by mouth at bedtime.  Refills: 3   Associated Diagnoses: Iron deficiency anemia due to chronic blood loss    diazepam (VALIUM) 5 MG tablet Take 5 mg by mouth at bedtime. For sleep    docusate sodium (COLACE) 100 MG capsule Take 100 mg by mouth at bedtime.     dolutegravir (TIVICAY) 50 MG tablet Take 50 mg by mouth at bedtime.    Associated Diagnoses: Iron deficiency anemia due to chronic blood loss    esomeprazole (NEXIUM) 40 MG capsule Take 1 capsule (40 mg total) by mouth 2 (two) times daily before a  meal. AM Qty: 60 capsule, Refills: 3    ferrous sulfate 325 (65 FE) MG tablet Take 325 mg by mouth 2 (two) times daily.    folic acid (FOLVITE) 1 MG tablet Take 1 mg by mouth every evening.     HYDROcodone-acetaminophen (NORCO/VICODIN) 5-325 MG tablet Take 1 tablet by mouth See admin instructions. Take 1 tablet by mouth at bedtime if needed for pain, and Take 1 tablet by mouth 30 min before physical therapy. Refills: 0    losartan (COZAAR) 100 MG tablet Take 100 mg by mouth every morning.  Refills: 0    nystatin (MYCOSTATIN) 100000 UNIT/ML suspension Take 5 mLs (500,000 Units total) by mouth 4 (four) times daily. Qty: 60 mL, Refills: 0    omeprazole (PRILOSEC) 40 MG capsule Take 40 mg by mouth every morning.  Refills: 0    sertraline (ZOLOFT) 50 MG tablet Take 50 mg by mouth every evening.      !! - Potential duplicate medications found. Please discuss with provider.    STOP taking these medications     cephALEXin (KEFLEX) 500 MG capsule      colchicine 0.6 MG tablet      chlorthalidone (HYGROTON) 25 MG tablet      cyclobenzaprine (FLEXERIL) 10 MG tablet      doxazosin (CARDURA) 4 MG tablet      hydrALAZINE (APRESOLINE) 50 MG tablet               Today   CHIEF COMPLAINT:   LEE  No chest pain overnight   VITAL SIGNS:  Blood pressure 158/82, pulse 65, temperature 98.5 F (36.9 C), temperature source Oral, resp. rate 18, height 5\' 11"  (1.803 m), weight 93.033 kg (205 lb 1.6 oz), SpO2 100 %.   REVIEW OF SYSTEMS:  Review of Systems  Cardiovascular: Positive for leg swelling.     PHYSICAL EXAMINATION:  GENERAL:  65 y.o.-year-old patient lying in the bed with no acute distress.  NECK:  Supple, no jugular venous distention. No thyroid enlargement, no tenderness.  LUNGS: Normal breath sounds bilaterally, no wheezing, rales,rhonchi  No use of accessory muscles of respiration.  CARDIOVASCULAR: S1, S2 normal. No murmurs, rubs, or gallops.  ABDOMEN: Soft,  non-tender, non-distended. Bowel sounds present. No organomegaly or mass.  EXTREMITIES: minimal LEE NO cyanosis, or clubbing.  PSYCHIATRIC: The patient is alert and oriented x 3.  SKIN: No obvious rash, lesion, or ulcer.   DATA REVIEW:   CBC  Recent Labs Lab 11/10/15 0559  WBC 6.5  HGB 12.5*  HCT 37.6*  PLT 113*    Chemistries   Recent Labs Lab 11/10/15 0559 11/11/15 0317  NA 140 139  K 3.9 3.7  CL 110 109  CO2 25 25  GLUCOSE 91  134*  BUN 19 20  CREATININE 1.60* 1.59*  CALCIUM 8.4* 8.8*  AST 39  --   ALT 47  --   ALKPHOS 69  --   BILITOT 0.3  --     Cardiac Enzymes  Recent Labs Lab 11/09/15 1807 11/09/15 2352 11/10/15 0559  TROPONINI <0.03 <0.03 <0.03    Microbiology Results  @[email or web address] @  RADIOLOGY:  Ct Angio Chest Pe W/cm &/or Wo Cm  11/09/2015  CLINICAL DATA:  C/O SOB and CP since yesterday. RLE swelling since last week. No hx CA or surg to chest. HX HIV. TKV EXAM: CT ANGIOGRAPHY CHEST WITH CONTRAST TECHNIQUE: Multidetector CT imaging of the chest was performed using the standard protocol during bolus administration of intravenous contrast. Multiplanar CT image reconstructions and MIPs were obtained to evaluate the vascular anatomy. CONTRAST:  75 mL Isovue 370 IV COMPARISON:  None. FINDINGS: Vascular: Left arm IV contrast administration. Innominate vein and SVC are patent. Right atrium and right ventricle are nondilated. Satisfactory opacification of pulmonary arteries noted, and there is no evidence of pulmonary emboli. Patent bilateral pulmonary veins drain into the left atrium. Scattered coronary calcifications. Aortic valve leaflet calcifications. Adequate contrast opacification of the thoracic aorta with no evidence of dissection, aneurysm, or stenosis. There is classic 3-vessel brachiocephalic arch anatomy without proximal stenosis. Mild plaque in the aortic arch and descending segment. Mediastinum/Lymph Nodes: No masses or pathologically enlarged lymph  nodes identified. No pericardial effusion. Lungs/Pleura: Mild dependent atelectasis posteriorly in both lower lobes. 4 mm subpleural nodule, superior segment right lower lobe image 79/6. 4 mm nodule adjacent to the right major fissure image 81/6. Lungs otherwise clear. No pleural effusion. No pneumothorax. Upper abdomen: No acute findings. Cholecystectomy clips and coarse calcification in the gallbladder fossa. Musculoskeletal: No chest wall mass or suspicious bone lesions identified. Review of the MIP images confirms the above findings. IMPRESSION: 1. Negative for acute PE or thoracic aortic dissection. 2. Atherosclerosis, including aortic and coronary artery disease. Please note that although the presence of coronary artery calcium documents the presence of coronary artery disease, the severity of this disease and any potential stenosis cannot be assessed on this non-gated CT examination. Assessment for potential risk factor modification, dietary therapy or pharmacologic therapy may be warranted, if clinically indicated. 3. Two subpleural 4 mm right lower lobe pulmonary nodules. No follow-up needed if patient is low-risk (and has no known or suspected primary neoplasm). Non-contrast chest CT can be considered in 12 months if patient is high-risk. This recommendation follows the consensus statement: Guidelines for Management of Incidental Pulmonary Nodules Detected on CT Images:From the Fleischner Society 2017; published online before print (10.1148/radiol.IJ:2314499). Electronically Signed   By: Lucrezia Europe M.D.   On: 11/09/2015 13:36      Management plans discussed with the patient and he is in agreement. Stable for discharge home  Patient should follow up with PCP in 1 week  CODE STATUS:     Code Status Orders        Start     Ordered   11/09/15 1702  Full code   Continuous     11/09/15 1701    Code Status History    Date Active Date Inactive Code Status Order ID Comments User Context    01/19/2015  3:34 AM 01/23/2015  5:54 PM Full Code [id number]  Juluis Mire, MD ED      TOTAL TIME TAKING CARE OF THIS PATIENT: 35 minutes.    Note: This dictation was prepared with Viviann Spare  dictation along with smaller phrase technology. Any transcriptional errors that result from this process are unintentional.  Kosha Jaquith M.D on 11/11/2015 at 12:05 PM  Between 7am to 6pm - Pager - [phone] After 6pm go to [email or web address] - password [email or web address] [facility]  Office  [phone]  CC: Primary care physician; Tracie Harrier, MD

## 2015-11-13 DIAGNOSIS — I214 Non-ST elevation (NSTEMI) myocardial infarction: Secondary | ICD-10-CM | POA: Insufficient documentation

## 2015-12-11 ENCOUNTER — Encounter: Payer: Medicare Other | Attending: Internal Medicine | Admitting: *Deleted

## 2015-12-11 VITALS — Ht 70.3 in | Wt 210.9 lb

## 2015-12-11 DIAGNOSIS — I10 Essential (primary) hypertension: Secondary | ICD-10-CM | POA: Insufficient documentation

## 2015-12-11 DIAGNOSIS — I214 Non-ST elevation (NSTEMI) myocardial infarction: Secondary | ICD-10-CM | POA: Insufficient documentation

## 2015-12-11 NOTE — Progress Notes (Signed)
Daily Session Note  Patient Details  Name: Jared Walker MRN: [id number] Date of Birth: 1951/05/20 Referring Provider:    Encounter Date: 12/11/2015  Check In:     Session Check In - 12/11/15 1536    Check-In   Location [facility]   Staff Present Gerlene Burdock, RN, Vickki Hearing, BA, ACSM CEP, Exercise Physiologist   Supervising physician immediately available to respond to emergencies See telemetry face sheet for immediately available ER MD   Medication changes reported     No   Fall or balance concerns reported    No   Warm-up and Cool-down Not performed (comment)  Only 6 min walk done today in the hallway   Resistance Training Performed No   VAD Patient? No   Pain Assessment   Currently in Pain? Yes   Pain Score 8    Pain Location Back   Pain Orientation Lower   Pain Descriptors / Indicators Constant   Pain Type Chronic pain   Aggravating Factors  walking at times   Pain Relieving Factors Nothing much had back surgery in the past,         Goals Met:  Proper associated with RPD/PD & O2 Sat Personal goals reviewed  Goals Unmet:  Not Applicable  Comments:     Dr. Emily Filbert is Medical Director for [facility].

## 2015-12-11 NOTE — Progress Notes (Signed)
Cardiac Individual Treatment Plan  Patient Details  Name: Jared Walker MRN: [id number] Date of Birth: [birth year] Referring Provider:    Initial Encounter Date:   Visit Diagnosis: NSTEMI (non-ST elevated myocardial infarction) ([place])  Patient's Home Medications on Admission:  Current outpatient prescriptions:  .  amLODipine (NORVASC) 10 MG tablet, Take 10 mg by mouth., Disp: , Rfl:  .  atorvastatin (LIPITOR) 10 MG tablet, Take 80 mg by mouth., Disp: , Rfl:  .  allopurinol (ZYLOPRIM) 100 MG tablet, Take 200 mg by mouth daily. *Take along with 300 mg tablet to equal total dose of 500 mg daily.*, Disp: , Rfl:  .  allopurinol (ZYLOPRIM) 300 MG tablet, Take 300 mg by mouth See admin instructions. *Take along with 2 tablets of 100 mg (200 mg) to equal total dose of 500 mg by mouth daily*, Disp: , Rfl:  .  aspirin EC 81 MG EC tablet, Take 1 tablet (81 mg total) by mouth daily., Disp: 120 tablet, Rfl: 0 .  atenolol (TENORMIN) 100 MG tablet, Take 100 mg by mouth every evening. , Disp: , Rfl:  .  Cholecalciferol (VITAMIN D-1000 MAX ST) 1000 units tablet, Take 1,000 Units by mouth daily., Disp: , Rfl:  .  cloNIDine (CATAPRES) 0.1 MG tablet, Take 0.1 mg by mouth at bedtime. , Disp: , Rfl:  .  Cyanocobalamin (RA VITAMIN B-12 TR) 1000 MCG TBCR, Take 1,000 mcg by mouth daily., Disp: , Rfl:  .  DESCOVY 200-25 MG tablet, Take 1 tablet by mouth at bedtime. , Disp: , Rfl: 3 .  diazepam (VALIUM) 5 MG tablet, Take 5 mg by mouth at bedtime. For sleep, Disp: , Rfl:  .  docusate sodium (COLACE) 100 MG capsule, Take 100 mg by mouth at bedtime. , Disp: , Rfl:  .  dolutegravir (TIVICAY) 50 MG tablet, Take 50 mg by mouth at bedtime. , Disp: , Rfl:  .  esomeprazole (NEXIUM) 40 MG capsule, Take 1 capsule (40 mg total) by mouth 2 (two) times daily before a meal. AM (Patient taking differently: Take 40 mg by mouth every evening. AM), Disp: 60 capsule, Rfl: 3 .  ferrous sulfate 325 (65 FE) MG tablet, Take 325 mg by  mouth 2 (two) times daily., Disp: , Rfl:  .  folic acid (FOLVITE) 1 MG tablet, Take 1 mg by mouth every evening. , Disp: , Rfl:  .  HYDROcodone-acetaminophen (NORCO/VICODIN) 5-325 MG tablet, Take 1 tablet by mouth See admin instructions. Take 1 tablet by mouth at bedtime if needed for pain, and Take 1 tablet by mouth 30 min before physical therapy., Disp: , Rfl: 0 .  losartan (COZAAR) 100 MG tablet, Take 100 mg by mouth every morning. , Disp: , Rfl: 0 .  nystatin (MYCOSTATIN) 100000 UNIT/ML suspension, Take 5 mLs (500,000 Units total) by mouth 4 (four) times daily. (Patient taking differently: Take 5 mLs by mouth 4 (four) times daily as needed. For throat pain.), Disp: 60 mL, Rfl: 0 .  omeprazole (PRILOSEC) 40 MG capsule, Take 40 mg by mouth every morning. , Disp: , Rfl: 0 .  sertraline (ZOLOFT) 50 MG tablet, Take 50 mg by mouth every evening. , Disp: , Rfl:   Past Medical History: Past Medical History  Diagnosis Date  . Gout   . Hypertension   . Heart murmur   . Aortic stenosis, mild     mild AS by echo, 08/2011  . Chronic kidney disease     kidney stones  . HIV positive ([place])  09/25/11  . Hepatitis C 09/25/11  . Full dentures     upper and lower  . Hep C w/o coma, chronic ([place]) 01/19/2015  . Anemia   . Depression     Tobacco Use: History  Smoking status  . Former Smoker  Smokeless tobacco  . Former Systems developer  . Quit date: 03/13/1972    Labs: Recent Review Flowsheet Data    Labs for ITP Cardiac and Pulmonary Rehab Latest Ref Rng 11/13/2011 11/10/2015   Cholestrol 0 - 200 mg/dL 132 120   LDLCALC 0 - 99 mg/dL 71 69   HDL >40 mg/dL 36(L) 30(L)   Trlycerides <150 mg/dL 124 107       Exercise Target Goals:    Exercise Program Goal: Individual exercise prescription set with THRR, safety & activity barriers. Participant demonstrates ability to understand and report RPE using BORG scale, to self-measure pulse accurately, and to acknowledge the importance of the exercise  prescription.  Exercise Prescription Goal: Starting with aerobic activity 30 plus minutes a day, 3 days per week for initial exercise prescription. Provide home exercise prescription and guidelines that participant acknowledges understanding prior to discharge.  Activity Barriers & Risk Stratification:     Activity Barriers & Cardiac Risk Stratification - 12/11/15 1536    Activity Barriers & Cardiac Risk Stratification   Activity Barriers Deconditioning;Back Problems   Cardiac Risk Stratification High      6 Minute Walk:   Initial Exercise Prescription:   Perform Capillary Blood Glucose checks as needed.  Exercise Prescription Changes:   Exercise Comments:   Discharge Exercise Prescription (Final Exercise Prescription Changes):   Nutrition:  Target Goals: Understanding of nutrition guidelines, daily intake of sodium 1500mg , cholesterol 200mg , calories 30% from fat and 7% or less from saturated fats, daily to have 5 or more servings of fruits and vegetables.  Biometrics:     Pre Biometrics - 12/11/15 1534    Pre Biometrics   Height 5' 10.3" (1.786 m)   Weight 210 lb 14.4 oz (95.664 kg)   Waist Circumference 43.75 inches   Hip Circumference 43 inches   Waist to Hip Ratio 1.02 %   BMI (Calculated) 30.1   Single Leg Stand 23 seconds       Nutrition Therapy Plan and Nutrition Goals:     Nutrition Therapy & Goals - 12/11/15 1538    Nutrition Therapy   Drug/Food Interactions Statins/Certain Fruits   Intervention Plan   Intervention Prescribe, educate and counsel regarding individualized specific dietary modifications aiming towards targeted core components such as weight, hypertension, lipid management, diabetes, heart failure and other comorbidities.;Nutrition handout(s) given to patient.   Expected Outcomes Short Term Goal: Understand basic principles of dietary content, such as calories, fat, sodium, cholesterol and nutrients.;Long Term Goal: Adherence to  prescribed nutrition plan.;Short Term Goal: A plan has been developed with personal nutrition goals set during dietitian appointment.      Nutrition Discharge: Rate Your Plate Scores:   Nutrition Goals Re-Evaluation:   Psychosocial: Target Goals: Acknowledge presence or absence of depression, maximize coping skills, provide positive support system. Participant is able to verbalize types and ability to use techniques and skills needed for reducing stress and depression.  Initial Review & Psychosocial Screening:     Initial Psych Review & Screening - 12/11/15 1539    Initial Review   Current issues with Current Stress Concerns   Family Dynamics   Good Support System? Yes   Screening Interventions   Interventions Encouraged to exercise;Program counselor  consult      Quality of Life Scores:   PHQ-9:     Recent Review Flowsheet Data    There is no flowsheet data to display.      Psychosocial Evaluation and Intervention:   Psychosocial Re-Evaluation:   Vocational Rehabilitation: Provide vocational rehab assistance to qualifying candidates.   Vocational Rehab Evaluation & Intervention:     Vocational Rehab - 12/11/15 1536    Initial Vocational Rehab Evaluation & Intervention   Assessment shows need for Vocational Rehabilitation No      Education: Education Goals: Education classes will be provided on a weekly basis, covering required topics. Participant will state understanding/return demonstration of topics presented.  Learning Barriers/Preferences:     Learning Barriers/Preferences - 12/11/15 1536    Learning Barriers/Preferences   Learning Barriers None   Learning Preferences Computer/Internet;Individual Instruction;Group Instruction      Education Topics: General Nutrition Guidelines/Fats and Fiber: -Group instruction provided by verbal, written material, models and posters to present the general guidelines for heart healthy nutrition. Gives an  explanation and review of dietary fats and fiber.   Controlling Sodium/Reading Food Labels: -Group verbal and written material supporting the discussion of sodium use in heart healthy nutrition. Review and explanation with models, verbal and written materials for utilization of the food label.   Exercise Physiology & Risk Factors: - Group verbal and written instruction with models to review the exercise physiology of the cardiovascular system and associated critical values. Details cardiovascular disease risk factors and the goals associated with each risk factor.   Aerobic Exercise & Resistance Training: - Gives group verbal and written discussion on the health impact of inactivity. On the components of aerobic and resistive training programs and the benefits of this training and how to safely progress through these programs.   Flexibility, Balance, General Exercise Guidelines: - Provides group verbal and written instruction on the benefits of flexibility and balance training programs. Provides general exercise guidelines with specific guidelines to those with heart or lung disease. Demonstration and skill practice provided.   Stress Management: - Provides group verbal and written instruction about the health risks of elevated stress, cause of high stress, and healthy ways to reduce stress.   Depression: - Provides group verbal and written instruction on the correlation between heart/lung disease and depressed mood, treatment options, and the stigmas associated with seeking treatment.   Anatomy & Physiology of the Heart: - Group verbal and written instruction and models provide basic cardiac anatomy and physiology, with the coronary electrical and arterial systems. Review of: AMI, Angina, Valve disease, Heart Failure, Cardiac Arrhythmia, Pacemakers, and the ICD.   Cardiac Procedures: - Group verbal and written instruction and models to describe the testing methods done to diagnose  heart disease. Reviews the outcomes of the test results. Describes the treatment choices: Medical Management, Angioplasty, or Coronary Bypass Surgery.   Cardiac Medications: - Group verbal and written instruction to review commonly prescribed medications for heart disease. Reviews the medication, class of the drug, and side effects. Includes the steps to properly store meds and maintain the prescription regimen.   Go Sex-Intimacy & Heart Disease, Get SMART - Goal Setting: - Group verbal and written instruction through game format to discuss heart disease and the return to sexual intimacy. Provides group verbal and written material to discuss and apply goal setting through the application of the S.M.A.R.T. Method.   Other Matters of the Heart: - Provides group verbal, written materials and models to describe Heart Failure, Angina,  Valve Disease, and Diabetes in the realm of heart disease. Includes description of the disease process and treatment options available to the cardiac patient.   Exercise & Equipment Safety: - Individual verbal instruction and demonstration of equipment use and safety with use of the equipment.          Cardiac Rehab from 12/11/2015 in [facility]   Date  12/11/15   Educator  C. [email or web address]   Instruction Review Code  1- partially meets, needs review/practice      Infection Prevention: - Provides verbal and written material to individual with discussion of infection control including proper hand washing and proper equipment cleaning during exercise session.      Cardiac Rehab from 12/11/2015 in [facility]   Date  12/11/15   Educator  C. [email or web address]   Instruction Review Code  2- meets goals/outcomes      Falls Prevention: - Provides verbal and written material to individual with discussion of falls prevention and safety.      Cardiac Rehab from 12/11/2015 in [facility]   Date  12/11/15   Educator   C. [place]   Instruction Review Code  2- meets goals/outcomes      Diabetes: - Individual verbal and written instruction to review signs/symptoms of diabetes, desired ranges of glucose level fasting, after meals and with exercise. Advice that pre and post exercise glucose checks will be done for 3 sessions at entry of program.    Knowledge Questionnaire Score:   Core Components/Risk Factors/Patient Goals at Admission:     Personal Goals and Risk Factors at Admission - 12/11/15 1539    Core Components/Risk Factors/Patient Goals on Admission   Sedentary Yes   Intervention Provide advice, education, support and counseling about physical activity/exercise needs.;Develop an individualized exercise prescription for aerobic and resistive training based on initial evaluation findings, risk stratification, comorbidities and participant's personal goals.   Expected Outcomes Achievement of increased cardiorespiratory fitness and enhanced flexibility, muscular endurance and strength shown through measurements of functional capacity and personal statement of participant.   Hypertension Yes   Intervention Provide education on lifestyle modifcations including regular physical activity/exercise, weight management, moderate sodium restriction and increased consumption of fresh fruit, vegetables, and low fat dairy, alcohol moderation, and smoking cessation.;Monitor prescription use compliance.   Expected Outcomes Short Term: Continued assessment and intervention until BP is < 140/41mm HG in hypertensive participants. < 130/93mm HG in hypertensive participants with diabetes, heart failure or chronic kidney disease.;Long Term: Maintenance of blood pressure at goal levels.   Stress Yes   Intervention Offer individual and/or small group education and counseling on adjustment to heart disease, stress management and health-related lifestyle change. Teach and support self-help strategies.;Refer participants  experiencing significant psychosocial distress to appropriate mental health specialists for further evaluation and treatment. When possible, include family members and significant others in education/counseling sessions.   Expected Outcomes Short Term: Participant demonstrates changes in health-related behavior, relaxation and other stress management skills, ability to obtain effective social support, and compliance with psychotropic medications if prescribed.;Long Term: Emotional wellbeing is indicated by absence of clinically significant psychosocial distress or social isolation.      Core Components/Risk Factors/Patient Goals Review:    Core Components/Risk Factors/Patient Goals at Discharge (Final Review):    ITP Comments:   Comments:

## 2015-12-12 NOTE — Progress Notes (Signed)
Cardiac Individual Treatment Plan  Patient Details  Name: Jared Walker MRN: [id number] Date of Birth: 12/01/1950 Referring Provider:  Gayleen Orem, MD      Cardiac Rehab from 12/11/2015 in [facility] Cardiac and Pulmonary Rehab   Referring Provider  Stann Mainland      Initial Encounter Date:       Cardiac Rehab from 12/11/2015 in [facility] Cardiac and Pulmonary Rehab   Date  12/11/15   Referring Provider  Stann Mainland      Visit Diagnosis: NSTEMI (non-ST elevated myocardial infarction) [facility])  Patient's Home Medications on Admission:  Current outpatient prescriptions:  .  amLODipine (NORVASC) 10 MG tablet, Take 10 mg by mouth., Disp: , Rfl:  .  atorvastatin (LIPITOR) 10 MG tablet, Take 80 mg by mouth., Disp: , Rfl:  .  allopurinol (ZYLOPRIM) 100 MG tablet, Take 200 mg by mouth daily. *Take along with 300 mg tablet to equal total dose of 500 mg daily.*, Disp: , Rfl:  .  allopurinol (ZYLOPRIM) 300 MG tablet, Take 300 mg by mouth See admin instructions. *Take along with 2 tablets of 100 mg (200 mg) to equal total dose of 500 mg by mouth daily*, Disp: , Rfl:  .  aspirin EC 81 MG EC tablet, Take 1 tablet (81 mg total) by mouth daily., Disp: 120 tablet, Rfl: 0 .  atenolol (TENORMIN) 100 MG tablet, Take 100 mg by mouth every evening. , Disp: , Rfl:  .  Cholecalciferol (VITAMIN D-1000 MAX ST) 1000 units tablet, Take 1,000 Units by mouth daily., Disp: , Rfl:  .  cloNIDine (CATAPRES) 0.1 MG tablet, Take 0.1 mg by mouth at bedtime. , Disp: , Rfl:  .  Cyanocobalamin (RA VITAMIN B-12 TR) 1000 MCG TBCR, Take 1,000 mcg by mouth daily., Disp: , Rfl:  .  DESCOVY 200-25 MG tablet, Take 1 tablet by mouth at bedtime. , Disp: , Rfl: 3 .  diazepam (VALIUM) 5 MG tablet, Take 5 mg by mouth at bedtime. For sleep, Disp: , Rfl:  .  docusate sodium (COLACE) 100 MG capsule, Take 100 mg by mouth at bedtime. , Disp: , Rfl:  .  dolutegravir (TIVICAY) 50 MG tablet, Take 50 mg by mouth at bedtime. , Disp: , Rfl:  .  esomeprazole  (NEXIUM) 40 MG capsule, Take 1 capsule (40 mg total) by mouth 2 (two) times daily before a meal. AM (Patient taking differently: Take 40 mg by mouth every evening. AM), Disp: 60 capsule, Rfl: 3 .  ferrous sulfate 325 (65 FE) MG tablet, Take 325 mg by mouth 2 (two) times daily., Disp: , Rfl:  .  folic acid (FOLVITE) 1 MG tablet, Take 1 mg by mouth every evening. , Disp: , Rfl:  .  HYDROcodone-acetaminophen (NORCO/VICODIN) 5-325 MG tablet, Take 1 tablet by mouth See admin instructions. Take 1 tablet by mouth at bedtime if needed for pain, and Take 1 tablet by mouth 30 min before physical therapy., Disp: , Rfl: 0 .  losartan (COZAAR) 100 MG tablet, Take 100 mg by mouth every morning. , Disp: , Rfl: 0 .  nystatin (MYCOSTATIN) 100000 UNIT/ML suspension, Take 5 mLs (500,000 Units total) by mouth 4 (four) times daily. (Patient taking differently: Take 5 mLs by mouth 4 (four) times daily as needed. For throat pain.), Disp: 60 mL, Rfl: 0 .  omeprazole (PRILOSEC) 40 MG capsule, Take 40 mg by mouth every morning. , Disp: , Rfl: 0 .  sertraline (ZOLOFT) 50 MG tablet, Take 50 mg by mouth every evening. ,  Disp: , Rfl:   Past Medical History: Past Medical History  Diagnosis Date  . Gout   . Hypertension   . Heart murmur   . Aortic stenosis, mild     mild AS by echo, 08/2011  . Chronic kidney disease     kidney stones  . HIV positive ([place]) 09/25/11  . Hepatitis C 09/25/11  . Full dentures     upper and lower  . Hep C w/o coma, chronic ([place]) 01/19/2015  . Anemia   . Depression     Tobacco Use: History  Smoking status  . Former Smoker  Smokeless tobacco  . Former Systems developer  . Quit date: 03/13/1972    Labs: Recent Review Flowsheet Data    Labs for ITP Cardiac and Pulmonary Rehab Latest Ref Rng 11/13/2011 11/10/2015   Cholestrol 0 - 200 mg/dL 132 120   LDLCALC 0 - 99 mg/dL 71 69   HDL >40 mg/dL 36(L) 30(L)   Trlycerides <150 mg/dL 124 107       Exercise Target Goals: Date: 12/11/15  Exercise  Program Goal: Individual exercise prescription set with THRR, safety & activity barriers. Participant demonstrates ability to understand and report RPE using BORG scale, to self-measure pulse accurately, and to acknowledge the importance of the exercise prescription.  Exercise Prescription Goal: Starting with aerobic activity 30 plus minutes a day, 3 days per week for initial exercise prescription. Provide home exercise prescription and guidelines that participant acknowledges understanding prior to discharge.  Activity Barriers & Risk Stratification:     Activity Barriers & Cardiac Risk Stratification - 12/11/15 1536    Activity Barriers & Cardiac Risk Stratification   Activity Barriers Deconditioning;Back Problems   Cardiac Risk Stratification High      6 Minute Walk:     6 Minute Walk      12/11/15 1539       6 Minute Walk   Distance 1174 feet     Walk Time 6 minutes     # of Rest Breaks 0     MPH 2.22     METS 2.84     RPE 14     VO2 Peak 9.95     Symptoms Yes (comment)     Comments chronic back pain 8/10     Resting HR 60 bpm     Resting BP 132/80 mmHg     Max Ex. HR 88 bpm     Max Ex. BP 132/72 mmHg        Initial Exercise Prescription:     Initial Exercise Prescription - 12/11/15 1500    Date of Initial Exercise RX and Referring Provider   Date 12/11/15   Referring Provider Stann Mainland   Treadmill   MPH 2  only if it doesnt cause back pain   Grade 0   Minutes 15   METs 2.53   Recumbant Bike   Level 2   RPM 60   Watts 15   Minutes 15   METs 2.5   NuStep   Level 2   Watts 40   Minutes 15   Recumbant Elliptical   Level 2   RPM 60   Minutes 15   METs 2.5   REL-XR   Level 2   Minutes 15   METs 2.5   T5 Nustep   Level 2   Minutes 15   METs 2.5   Biostep-RELP   Level 2   Minutes 15   METs 2.5   Prescription Details  Frequency (times per week) 3   Duration Progress to 45 minutes of aerobic exercise without signs/symptoms of physical  distress   Intensity   THRR 40-80% of Max Heartrate 98-137   Ratings of Perceived Exertion 11-13   Progression   Progression Continue to progress workloads to maintain intensity without signs/symptoms of physical distress.   Resistance Training   Training Prescription Yes   Weight 2   Reps 10-15      Perform Capillary Blood Glucose checks as needed.  Exercise Prescription Changes:   Exercise Comments:   Discharge Exercise Prescription (Final Exercise Prescription Changes):   Nutrition:  Target Goals: Understanding of nutrition guidelines, daily intake of sodium 1500mg , cholesterol 200mg , calories 30% from fat and 7% or less from saturated fats, daily to have 5 or more servings of fruits and vegetables.  Biometrics:     Pre Biometrics - 12/11/15 1534    Pre Biometrics   Height 5' 10.3" (1.786 m)   Weight 210 lb 14.4 oz (95.664 kg)   Waist Circumference 43.75 inches   Hip Circumference 43 inches   Waist to Hip Ratio 1.02 %   BMI (Calculated) 30.1   Single Leg Stand 23 seconds       Nutrition Therapy Plan and Nutrition Goals:     Nutrition Therapy & Goals - 12/11/15 1538    Nutrition Therapy   Drug/Food Interactions Statins/Certain Fruits   Intervention Plan   Intervention Prescribe, educate and counsel regarding individualized specific dietary modifications aiming towards targeted core components such as weight, hypertension, lipid management, diabetes, heart failure and other comorbidities.;Nutrition handout(s) given to patient.   Expected Outcomes Short Term Goal: Understand basic principles of dietary content, such as calories, fat, sodium, cholesterol and nutrients.;Long Term Goal: Adherence to prescribed nutrition plan.;Short Term Goal: A plan has been developed with personal nutrition goals set during dietitian appointment.      Nutrition Discharge: Rate Your Plate Scores:   Nutrition Goals Re-Evaluation:   Psychosocial: Target Goals: Acknowledge  presence or absence of depression, maximize coping skills, provide positive support system. Participant is able to verbalize types and ability to use techniques and skills needed for reducing stress and depression.  Initial Review & Psychosocial Screening:     Initial Psych Review & Screening - 12/11/15 1539    Initial Review   Current issues with Current Stress Concerns   Family Dynamics   Good Support System? Yes   Screening Interventions   Interventions Encouraged to exercise;Program counselor consult      Quality of Life Scores:   PHQ-9:     Recent Review Flowsheet Data    Depression screen [facility] 2/9 12/11/2015   Decreased Interest 3   Down, Depressed, Hopeless 3   PHQ - 2 Score 6   Altered sleeping 1   Tired, decreased energy 3   Change in appetite 0   Feeling bad or failure about yourself  2   Trouble concentrating 1   Moving slowly or fidgety/restless 1   Suicidal thoughts 0   PHQ-9 Score 14   Difficult doing work/chores Somewhat difficult      Psychosocial Evaluation and Intervention:   Psychosocial Re-Evaluation:   Vocational Rehabilitation: Provide vocational rehab assistance to qualifying candidates.   Vocational Rehab Evaluation & Intervention:     Vocational Rehab - 12/11/15 1536    Initial Vocational Rehab Evaluation & Intervention   Assessment shows need for Vocational Rehabilitation No      Education: Education Goals: Education classes will be provided  on a weekly basis, covering required topics. Participant will state understanding/return demonstration of topics presented.  Learning Barriers/Preferences:     Learning Barriers/Preferences - 12/11/15 1536    Learning Barriers/Preferences   Learning Barriers None   Learning Preferences Computer/Internet;Individual Instruction;Group Instruction      Education Topics: General Nutrition Guidelines/Fats and Fiber: -Group instruction provided by verbal, written material, models and posters  to present the general guidelines for heart healthy nutrition. Gives an explanation and review of dietary fats and fiber.   Controlling Sodium/Reading Food Labels: -Group verbal and written material supporting the discussion of sodium use in heart healthy nutrition. Review and explanation with models, verbal and written materials for utilization of the food label.   Exercise Physiology & Risk Factors: - Group verbal and written instruction with models to review the exercise physiology of the cardiovascular system and associated critical values. Details cardiovascular disease risk factors and the goals associated with each risk factor.   Aerobic Exercise & Resistance Training: - Gives group verbal and written discussion on the health impact of inactivity. On the components of aerobic and resistive training programs and the benefits of this training and how to safely progress through these programs.   Flexibility, Balance, General Exercise Guidelines: - Provides group verbal and written instruction on the benefits of flexibility and balance training programs. Provides general exercise guidelines with specific guidelines to those with heart or lung disease. Demonstration and skill practice provided.   Stress Management: - Provides group verbal and written instruction about the health risks of elevated stress, cause of high stress, and healthy ways to reduce stress.   Depression: - Provides group verbal and written instruction on the correlation between heart/lung disease and depressed mood, treatment options, and the stigmas associated with seeking treatment.   Anatomy & Physiology of the Heart: - Group verbal and written instruction and models provide basic cardiac anatomy and physiology, with the coronary electrical and arterial systems. Review of: AMI, Angina, Valve disease, Heart Failure, Cardiac Arrhythmia, Pacemakers, and the ICD.   Cardiac Procedures: - Group verbal and written  instruction and models to describe the testing methods done to diagnose heart disease. Reviews the outcomes of the test results. Describes the treatment choices: Medical Management, Angioplasty, or Coronary Bypass Surgery.   Cardiac Medications: - Group verbal and written instruction to review commonly prescribed medications for heart disease. Reviews the medication, class of the drug, and side effects. Includes the steps to properly store meds and maintain the prescription regimen.   Go Sex-Intimacy & Heart Disease, Get SMART - Goal Setting: - Group verbal and written instruction through game format to discuss heart disease and the return to sexual intimacy. Provides group verbal and written material to discuss and apply goal setting through the application of the S.M.A.R.T. Method.   Other Matters of the Heart: - Provides group verbal, written materials and models to describe Heart Failure, Angina, Valve Disease, and Diabetes in the realm of heart disease. Includes description of the disease process and treatment options available to the cardiac patient.   Exercise & Equipment Safety: - Individual verbal instruction and demonstration of equipment use and safety with use of the equipment.          Cardiac Rehab from 12/11/2015 in [facility] Cardiac and Pulmonary Rehab   Date  12/11/15   Educator  C. [email or web address]   Instruction Review Code  1- partially meets, needs review/practice      Infection Prevention: - Provides verbal and written material to individual  with discussion of infection control including proper hand washing and proper equipment cleaning during exercise session.      Cardiac Rehab from 12/11/2015 in [facility] Cardiac and Pulmonary Rehab   Date  12/11/15   Educator  C. EnterkinRN   Instruction Review Code  2- meets goals/outcomes      Falls Prevention: - Provides verbal and written material to individual with discussion of falls prevention and safety.      Cardiac Rehab from  12/11/2015 in [facility] Cardiac and Pulmonary Rehab   Date  12/11/15   Educator  C. Tabiona   Instruction Review Code  2- meets goals/outcomes      Diabetes: - Individual verbal and written instruction to review signs/symptoms of diabetes, desired ranges of glucose level fasting, after meals and with exercise. Advice that pre and post exercise glucose checks will be done for 3 sessions at entry of program.    Knowledge Questionnaire Score:     Knowledge Questionnaire Score - 12/12/15 1521    Knowledge Questionnaire Score   Pre Score 21      Core Components/Risk Factors/Patient Goals at Admission:     Personal Goals and Risk Factors at Admission - 12/11/15 1539    Core Components/Risk Factors/Patient Goals on Admission   Sedentary Yes   Intervention Provide advice, education, support and counseling about physical activity/exercise needs.;Develop an individualized exercise prescription for aerobic and resistive training based on initial evaluation findings, risk stratification, comorbidities and participant's personal goals.   Expected Outcomes Achievement of increased cardiorespiratory fitness and enhanced flexibility, muscular endurance and strength shown through measurements of functional capacity and personal statement of participant.   Hypertension Yes   Intervention Provide education on lifestyle modifcations including regular physical activity/exercise, weight management, moderate sodium restriction and increased consumption of fresh fruit, vegetables, and low fat dairy, alcohol moderation, and smoking cessation.;Monitor prescription use compliance.   Expected Outcomes Short Term: Continued assessment and intervention until BP is < 140/40mm HG in hypertensive participants. < 130/8mm HG in hypertensive participants with diabetes, heart failure or chronic kidney disease.;Long Term: Maintenance of blood pressure at goal levels.   Stress Yes   Intervention Offer individual and/or  small group education and counseling on adjustment to heart disease, stress management and health-related lifestyle change. Teach and support self-help strategies.;Refer participants experiencing significant psychosocial distress to appropriate mental health specialists for further evaluation and treatment. When possible, include family members and significant others in education/counseling sessions.   Expected Outcomes Short Term: Participant demonstrates changes in health-related behavior, relaxation and other stress management skills, ability to obtain effective social support, and compliance with psychotropic medications if prescribed.;Long Term: Emotional wellbeing is indicated by absence of clinically significant psychosocial distress or social isolation.      Core Components/Risk Factors/Patient Goals Review:    Core Components/Risk Factors/Patient Goals at Discharge (Final Review):    ITP Comments:   Comments:

## 2015-12-12 NOTE — Patient Instructions (Signed)
Patient Instructions  Patient Details  Name: Jared Walker MRN: [id number] Date of Birth: Nov 04, 1950 Referring Provider:  Kathrin Greathouse, MD  Below are the personal goals you chose as well as exercise and nutrition goals. Our goal is to help you keep on track towards obtaining and maintaining your goals. We will be discussing your progress on these goals with you throughout the program.  Initial Exercise Prescription:     Initial Exercise Prescription - 12/11/15 1500    Date of Initial Exercise RX and Referring Provider   Date 12/11/15   Referring Provider Stann Mainland   Treadmill   MPH 2  only if it doesnt cause back pain   Grade 0   Minutes 15   METs 2.53   Recumbant Bike   Level 2   RPM 60   Watts 15   Minutes 15   METs 2.5   NuStep   Level 2   Watts 40   Minutes 15   Recumbant Elliptical   Level 2   RPM 60   Minutes 15   METs 2.5   REL-XR   Level 2   Minutes 15   METs 2.5   T5 Nustep   Level 2   Minutes 15   METs 2.5   Biostep-RELP   Level 2   Minutes 15   METs 2.5   Prescription Details   Frequency (times per week) 3   Duration Progress to 45 minutes of aerobic exercise without signs/symptoms of physical distress   Intensity   THRR 40-80% of Max Heartrate 98-137   Ratings of Perceived Exertion 11-13   Progression   Progression Continue to progress workloads to maintain intensity without signs/symptoms of physical distress.   Resistance Training   Training Prescription Yes   Weight 2   Reps 10-15      Exercise Goals: Frequency: Be able to perform aerobic exercise three times per week working toward 3-5 days per week.  Intensity: Work with a perceived exertion of 11 (fairly light) - 15 (hard) as tolerated. Follow your new exercise prescription and watch for changes in prescription as you progress with the program. Changes will be reviewed with you when they are made.  Duration: You should be able to do 30 minutes of continuous aerobic exercise in  addition to a 5 minute warm-up and a 5 minute cool-down routine.  Nutrition Goals: Your personal nutrition goals will be established when you do your nutrition analysis with the dietician.  The following are nutrition guidelines to follow: Cholesterol < 200mg /day Sodium < 1500mg /day Fiber: Men over 50 yrs - 30 grams per day  Personal Goals:     Personal Goals and Risk Factors at Admission - 12/11/15 1539    Core Components/Risk Factors/Patient Goals on Admission   Sedentary Yes   Intervention Provide advice, education, support and counseling about physical activity/exercise needs.;Develop an individualized exercise prescription for aerobic and resistive training based on initial evaluation findings, risk stratification, comorbidities and participant's personal goals.   Expected Outcomes Achievement of increased cardiorespiratory fitness and enhanced flexibility, muscular endurance and strength shown through measurements of functional capacity and personal statement of participant.   Hypertension Yes   Intervention Provide education on lifestyle modifcations including regular physical activity/exercise, weight management, moderate sodium restriction and increased consumption of fresh fruit, vegetables, and low fat dairy, alcohol moderation, and smoking cessation.;Monitor prescription use compliance.   Expected Outcomes Short Term: Continued assessment and intervention until BP is < 140/34mm HG in hypertensive participants. <  130/57mm HG in hypertensive participants with diabetes, heart failure or chronic kidney disease.;Long Term: Maintenance of blood pressure at goal levels.   Stress Yes   Intervention Offer individual and/or small group education and counseling on adjustment to heart disease, stress management and health-related lifestyle change. Teach and support self-help strategies.;Refer participants experiencing significant psychosocial distress to appropriate mental health specialists  for further evaluation and treatment. When possible, include family members and significant others in education/counseling sessions.   Expected Outcomes Short Term: Participant demonstrates changes in health-related behavior, relaxation and other stress management skills, ability to obtain effective social support, and compliance with psychotropic medications if prescribed.;Long Term: Emotional wellbeing is indicated by absence of clinically significant psychosocial distress or social isolation.      Tobacco Use Initial Evaluation: History  Smoking status  . Former Smoker  Smokeless tobacco  . Former Systems developer  . Quit date: 03/13/1972    Copy of goals given to participant.

## 2015-12-12 NOTE — Progress Notes (Signed)
Cardiac Individual Treatment Plan  Patient Details  Name: Jared Walker MRN: [id number] Date of Birth: Aug 07, 1950 Referring Provider:        Cardiac Rehab from 12/11/2015 in [facility] Cardiac and Pulmonary Rehab   Referring Provider  Stann Mainland      Initial Encounter Date:       Cardiac Rehab from 12/11/2015 in [facility] Cardiac and Pulmonary Rehab   Date  12/11/15   Referring Provider  Stann Mainland      Visit Diagnosis: NSTEMI (non-ST elevated myocardial infarction) [facility])  Patient's Home Medications on Admission:  Current outpatient prescriptions:  .  amLODipine (NORVASC) 10 MG tablet, Take 10 mg by mouth., Disp: , Rfl:  .  atorvastatin (LIPITOR) 10 MG tablet, Take 80 mg by mouth., Disp: , Rfl:  .  allopurinol (ZYLOPRIM) 100 MG tablet, Take 200 mg by mouth daily. *Take along with 300 mg tablet to equal total dose of 500 mg daily.*, Disp: , Rfl:  .  allopurinol (ZYLOPRIM) 300 MG tablet, Take 300 mg by mouth See admin instructions. *Take along with 2 tablets of 100 mg (200 mg) to equal total dose of 500 mg by mouth daily*, Disp: , Rfl:  .  aspirin EC 81 MG EC tablet, Take 1 tablet (81 mg total) by mouth daily., Disp: 120 tablet, Rfl: 0 .  atenolol (TENORMIN) 100 MG tablet, Take 100 mg by mouth every evening. , Disp: , Rfl:  .  Cholecalciferol (VITAMIN D-1000 MAX ST) 1000 units tablet, Take 1,000 Units by mouth daily., Disp: , Rfl:  .  cloNIDine (CATAPRES) 0.1 MG tablet, Take 0.1 mg by mouth at bedtime. , Disp: , Rfl:  .  Cyanocobalamin (RA VITAMIN B-12 TR) 1000 MCG TBCR, Take 1,000 mcg by mouth daily., Disp: , Rfl:  .  DESCOVY 200-25 MG tablet, Take 1 tablet by mouth at bedtime. , Disp: , Rfl: 3 .  diazepam (VALIUM) 5 MG tablet, Take 5 mg by mouth at bedtime. For sleep, Disp: , Rfl:  .  docusate sodium (COLACE) 100 MG capsule, Take 100 mg by mouth at bedtime. , Disp: , Rfl:  .  dolutegravir (TIVICAY) 50 MG tablet, Take 50 mg by mouth at bedtime. , Disp: , Rfl:  .  esomeprazole (NEXIUM) 40 MG  capsule, Take 1 capsule (40 mg total) by mouth 2 (two) times daily before a meal. AM (Patient taking differently: Take 40 mg by mouth every evening. AM), Disp: 60 capsule, Rfl: 3 .  ferrous sulfate 325 (65 FE) MG tablet, Take 325 mg by mouth 2 (two) times daily., Disp: , Rfl:  .  folic acid (FOLVITE) 1 MG tablet, Take 1 mg by mouth every evening. , Disp: , Rfl:  .  HYDROcodone-acetaminophen (NORCO/VICODIN) 5-325 MG tablet, Take 1 tablet by mouth See admin instructions. Take 1 tablet by mouth at bedtime if needed for pain, and Take 1 tablet by mouth 30 min before physical therapy., Disp: , Rfl: 0 .  losartan (COZAAR) 100 MG tablet, Take 100 mg by mouth every morning. , Disp: , Rfl: 0 .  nystatin (MYCOSTATIN) 100000 UNIT/ML suspension, Take 5 mLs (500,000 Units total) by mouth 4 (four) times daily. (Patient taking differently: Take 5 mLs by mouth 4 (four) times daily as needed. For throat pain.), Disp: 60 mL, Rfl: 0 .  omeprazole (PRILOSEC) 40 MG capsule, Take 40 mg by mouth every morning. , Disp: , Rfl: 0 .  sertraline (ZOLOFT) 50 MG tablet, Take 50 mg by mouth every evening. , Disp: ,  Rfl:   Past Medical History: Past Medical History  Diagnosis Date  . Gout   . Hypertension   . Heart murmur   . Aortic stenosis, mild     mild AS by echo, 08/2011  . Chronic kidney disease     kidney stones  . HIV positive ([place]) 09/25/11  . Hepatitis C 09/25/11  . Full dentures     upper and lower  . Hep C w/o coma, chronic ([place]) 01/19/2015  . Anemia   . Depression     Tobacco Use: History  Smoking status  . Former Smoker  Smokeless tobacco  . Former Systems developer  . Quit date: 03/13/1972    Labs: Recent Review Flowsheet Data    Labs for ITP Cardiac and Pulmonary Rehab Latest Ref Rng 11/13/2011 11/10/2015   Cholestrol 0 - 200 mg/dL 132 120   LDLCALC 0 - 99 mg/dL 71 69   HDL >40 mg/dL 36(L) 30(L)   Trlycerides <150 mg/dL 124 107       Exercise Target Goals: Date: 12/11/15  Exercise Program  Goal: Individual exercise prescription set with THRR, safety & activity barriers. Participant demonstrates ability to understand and report RPE using BORG scale, to self-measure pulse accurately, and to acknowledge the importance of the exercise prescription.  Exercise Prescription Goal: Starting with aerobic activity 30 plus minutes a day, 3 days per week for initial exercise prescription. Provide home exercise prescription and guidelines that participant acknowledges understanding prior to discharge.  Activity Barriers & Risk Stratification:     Activity Barriers & Cardiac Risk Stratification - 12/11/15 1536    Activity Barriers & Cardiac Risk Stratification   Activity Barriers Deconditioning;Back Problems   Cardiac Risk Stratification High      6 Minute Walk:     6 Minute Walk      12/11/15 1539       6 Minute Walk   Distance 1174 feet     Walk Time 6 minutes     # of Rest Breaks 0     MPH 2.22     METS 2.84     RPE 14     VO2 Peak 9.95     Symptoms Yes (comment)     Comments chronic back pain 8/10     Resting HR 60 bpm     Resting BP 132/80 mmHg     Max Ex. HR 88 bpm     Max Ex. BP 132/72 mmHg        Initial Exercise Prescription:     Initial Exercise Prescription - 12/11/15 1500    Date of Initial Exercise RX and Referring Provider   Date 12/11/15   Referring Provider Stann Mainland   Treadmill   MPH 2  only if it doesnt cause back pain   Grade 0   Minutes 15   METs 2.53   Recumbant Bike   Level 2   RPM 60   Watts 15   Minutes 15   METs 2.5   NuStep   Level 2   Watts 40   Minutes 15   Recumbant Elliptical   Level 2   RPM 60   Minutes 15   METs 2.5   REL-XR   Level 2   Minutes 15   METs 2.5   T5 Nustep   Level 2   Minutes 15   METs 2.5   Biostep-RELP   Level 2   Minutes 15   METs 2.5   Prescription Details   Frequency (  times per week) 3   Duration Progress to 45 minutes of aerobic exercise without signs/symptoms of physical distress    Intensity   THRR 40-80% of Max Heartrate 98-137   Ratings of Perceived Exertion 11-13   Progression   Progression Continue to progress workloads to maintain intensity without signs/symptoms of physical distress.   Resistance Training   Training Prescription Yes   Weight 2   Reps 10-15      Perform Capillary Blood Glucose checks as needed.  Exercise Prescription Changes:   Exercise Comments:   Discharge Exercise Prescription (Final Exercise Prescription Changes):   Nutrition:  Target Goals: Understanding of nutrition guidelines, daily intake of sodium 1500mg , cholesterol 200mg , calories 30% from fat and 7% or less from saturated fats, daily to have 5 or more servings of fruits and vegetables.  Biometrics:     Pre Biometrics - 12/11/15 1534    Pre Biometrics   Height 5' 10.3" (1.786 m)   Weight 210 lb 14.4 oz (95.664 kg)   Waist Circumference 43.75 inches   Hip Circumference 43 inches   Waist to Hip Ratio 1.02 %   BMI (Calculated) 30.1   Single Leg Stand 23 seconds       Nutrition Therapy Plan and Nutrition Goals:     Nutrition Therapy & Goals - 12/11/15 1538    Nutrition Therapy   Drug/Food Interactions Statins/Certain Fruits   Intervention Plan   Intervention Prescribe, educate and counsel regarding individualized specific dietary modifications aiming towards targeted core components such as weight, hypertension, lipid management, diabetes, heart failure and other comorbidities.;Nutrition handout(s) given to patient.   Expected Outcomes Short Term Goal: Understand basic principles of dietary content, such as calories, fat, sodium, cholesterol and nutrients.;Long Term Goal: Adherence to prescribed nutrition plan.;Short Term Goal: A plan has been developed with personal nutrition goals set during dietitian appointment.      Nutrition Discharge: Rate Your Plate Scores:   Nutrition Goals Re-Evaluation:   Psychosocial: Target Goals: Acknowledge presence or  absence of depression, maximize coping skills, provide positive support system. Participant is able to verbalize types and ability to use techniques and skills needed for reducing stress and depression.  Initial Review & Psychosocial Screening:     Initial Psych Review & Screening - 12/11/15 1539    Initial Review   Current issues with Current Stress Concerns   Family Dynamics   Good Support System? Yes   Screening Interventions   Interventions Encouraged to exercise;Program counselor consult      Quality of Life Scores:   PHQ-9:     Recent Review Flowsheet Data    Depression screen [facility] 2/9 12/11/2015   Decreased Interest 3   Down, Depressed, Hopeless 3   PHQ - 2 Score 6   Altered sleeping 1   Tired, decreased energy 3   Change in appetite 0   Feeling bad or failure about yourself  2   Trouble concentrating 1   Moving slowly or fidgety/restless 1   Suicidal thoughts 0   PHQ-9 Score 14   Difficult doing work/chores Somewhat difficult      Psychosocial Evaluation and Intervention:   Psychosocial Re-Evaluation:   Vocational Rehabilitation: Provide vocational rehab assistance to qualifying candidates.   Vocational Rehab Evaluation & Intervention:     Vocational Rehab - 12/11/15 1536    Initial Vocational Rehab Evaluation & Intervention   Assessment shows need for Vocational Rehabilitation No      Education: Education Goals: Education classes will be provided on  a weekly basis, covering required topics. Participant will state understanding/return demonstration of topics presented.  Learning Barriers/Preferences:     Learning Barriers/Preferences - 12/11/15 1536    Learning Barriers/Preferences   Learning Barriers None   Learning Preferences Computer/Internet;Individual Instruction;Group Instruction      Education Topics: General Nutrition Guidelines/Fats and Fiber: -Group instruction provided by verbal, written material, models and posters to present  the general guidelines for heart healthy nutrition. Gives an explanation and review of dietary fats and fiber.   Controlling Sodium/Reading Food Labels: -Group verbal and written material supporting the discussion of sodium use in heart healthy nutrition. Review and explanation with models, verbal and written materials for utilization of the food label.   Exercise Physiology & Risk Factors: - Group verbal and written instruction with models to review the exercise physiology of the cardiovascular system and associated critical values. Details cardiovascular disease risk factors and the goals associated with each risk factor.   Aerobic Exercise & Resistance Training: - Gives group verbal and written discussion on the health impact of inactivity. On the components of aerobic and resistive training programs and the benefits of this training and how to safely progress through these programs.   Flexibility, Balance, General Exercise Guidelines: - Provides group verbal and written instruction on the benefits of flexibility and balance training programs. Provides general exercise guidelines with specific guidelines to those with heart or lung disease. Demonstration and skill practice provided.   Stress Management: - Provides group verbal and written instruction about the health risks of elevated stress, cause of high stress, and healthy ways to reduce stress.   Depression: - Provides group verbal and written instruction on the correlation between heart/lung disease and depressed mood, treatment options, and the stigmas associated with seeking treatment.   Anatomy & Physiology of the Heart: - Group verbal and written instruction and models provide basic cardiac anatomy and physiology, with the coronary electrical and arterial systems. Review of: AMI, Angina, Valve disease, Heart Failure, Cardiac Arrhythmia, Pacemakers, and the ICD.   Cardiac Procedures: - Group verbal and written instruction  and models to describe the testing methods done to diagnose heart disease. Reviews the outcomes of the test results. Describes the treatment choices: Medical Management, Angioplasty, or Coronary Bypass Surgery.   Cardiac Medications: - Group verbal and written instruction to review commonly prescribed medications for heart disease. Reviews the medication, class of the drug, and side effects. Includes the steps to properly store meds and maintain the prescription regimen.   Go Sex-Intimacy & Heart Disease, Get SMART - Goal Setting: - Group verbal and written instruction through game format to discuss heart disease and the return to sexual intimacy. Provides group verbal and written material to discuss and apply goal setting through the application of the S.M.A.R.T. Method.   Other Matters of the Heart: - Provides group verbal, written materials and models to describe Heart Failure, Angina, Valve Disease, and Diabetes in the realm of heart disease. Includes description of the disease process and treatment options available to the cardiac patient.   Exercise & Equipment Safety: - Individual verbal instruction and demonstration of equipment use and safety with use of the equipment.          Cardiac Rehab from 12/11/2015 in [facility] Cardiac and Pulmonary Rehab   Date  12/11/15   Educator  C. [email or web address]   Instruction Review Code  1- partially meets, needs review/practice      Infection Prevention: - Provides verbal and written material to individual with  discussion of infection control including proper hand washing and proper equipment cleaning during exercise session.      Cardiac Rehab from 12/11/2015 in [facility] Cardiac and Pulmonary Rehab   Date  12/11/15   Educator  C. EnterkinRN   Instruction Review Code  2- meets goals/outcomes      Falls Prevention: - Provides verbal and written material to individual with discussion of falls prevention and safety.      Cardiac Rehab from 12/11/2015 in  [facility] Cardiac and Pulmonary Rehab   Date  12/11/15   Educator  C. Martorell   Instruction Review Code  2- meets goals/outcomes      Diabetes: - Individual verbal and written instruction to review signs/symptoms of diabetes, desired ranges of glucose level fasting, after meals and with exercise. Advice that pre and post exercise glucose checks will be done for 3 sessions at entry of program.    Knowledge Questionnaire Score:     Knowledge Questionnaire Score - 12/12/15 1521    Knowledge Questionnaire Score   Pre Score 21      Core Components/Risk Factors/Patient Goals at Admission:     Personal Goals and Risk Factors at Admission - 12/11/15 1539    Core Components/Risk Factors/Patient Goals on Admission   Sedentary Yes   Intervention Provide advice, education, support and counseling about physical activity/exercise needs.;Develop an individualized exercise prescription for aerobic and resistive training based on initial evaluation findings, risk stratification, comorbidities and participant's personal goals.   Expected Outcomes Achievement of increased cardiorespiratory fitness and enhanced flexibility, muscular endurance and strength shown through measurements of functional capacity and personal statement of participant.   Hypertension Yes   Intervention Provide education on lifestyle modifcations including regular physical activity/exercise, weight management, moderate sodium restriction and increased consumption of fresh fruit, vegetables, and low fat dairy, alcohol moderation, and smoking cessation.;Monitor prescription use compliance.   Expected Outcomes Short Term: Continued assessment and intervention until BP is < 140/38mm HG in hypertensive participants. < 130/82mm HG in hypertensive participants with diabetes, heart failure or chronic kidney disease.;Long Term: Maintenance of blood pressure at goal levels.   Stress Yes   Intervention Offer individual and/or small group  education and counseling on adjustment to heart disease, stress management and health-related lifestyle change. Teach and support self-help strategies.;Refer participants experiencing significant psychosocial distress to appropriate mental health specialists for further evaluation and treatment. When possible, include family members and significant others in education/counseling sessions.   Expected Outcomes Short Term: Participant demonstrates changes in health-related behavior, relaxation and other stress management skills, ability to obtain effective social support, and compliance with psychotropic medications if prescribed.;Long Term: Emotional wellbeing is indicated by absence of clinically significant psychosocial distress or social isolation.      Core Components/Risk Factors/Patient Goals Review:    Core Components/Risk Factors/Patient Goals at Discharge (Final Review):    ITP Comments:   Comments:

## 2015-12-13 NOTE — Progress Notes (Signed)
Cardiac Individual Treatment Plan  Patient Details  Name: Jared Walker MRN: [id number] Date of Birth: 30-Sep-1950 Referring Provider:        Cardiac Rehab from 12/11/2015 in [facility] Cardiac and Pulmonary Rehab   Referring Provider  Stann Mainland      Initial Encounter Date:       Cardiac Rehab from 12/11/2015 in [facility] Cardiac and Pulmonary Rehab   Date  12/11/15   Referring Provider  Stann Mainland      Visit Diagnosis: NSTEMI (non-ST elevated myocardial infarction) [facility])  Patient's Home Medications on Admission:  Current outpatient prescriptions:  .  amLODipine (NORVASC) 10 MG tablet, Take 10 mg by mouth., Disp: , Rfl:  .  atorvastatin (LIPITOR) 10 MG tablet, Take 80 mg by mouth., Disp: , Rfl:  .  allopurinol (ZYLOPRIM) 100 MG tablet, Take 200 mg by mouth daily. *Take along with 300 mg tablet to equal total dose of 500 mg daily.*, Disp: , Rfl:  .  allopurinol (ZYLOPRIM) 300 MG tablet, Take 300 mg by mouth See admin instructions. *Take along with 2 tablets of 100 mg (200 mg) to equal total dose of 500 mg by mouth daily*, Disp: , Rfl:  .  aspirin EC 81 MG EC tablet, Take 1 tablet (81 mg total) by mouth daily., Disp: 120 tablet, Rfl: 0 .  atenolol (TENORMIN) 100 MG tablet, Take 100 mg by mouth every evening. , Disp: , Rfl:  .  Cholecalciferol (VITAMIN D-1000 MAX ST) 1000 units tablet, Take 1,000 Units by mouth daily., Disp: , Rfl:  .  cloNIDine (CATAPRES) 0.1 MG tablet, Take 0.1 mg by mouth at bedtime. , Disp: , Rfl:  .  Cyanocobalamin (RA VITAMIN B-12 TR) 1000 MCG TBCR, Take 1,000 mcg by mouth daily., Disp: , Rfl:  .  DESCOVY 200-25 MG tablet, Take 1 tablet by mouth at bedtime. , Disp: , Rfl: 3 .  diazepam (VALIUM) 5 MG tablet, Take 5 mg by mouth at bedtime. For sleep, Disp: , Rfl:  .  docusate sodium (COLACE) 100 MG capsule, Take 100 mg by mouth at bedtime. , Disp: , Rfl:  .  dolutegravir (TIVICAY) 50 MG tablet, Take 50 mg by mouth at bedtime. , Disp: , Rfl:  .  esomeprazole (NEXIUM) 40 MG  capsule, Take 1 capsule (40 mg total) by mouth 2 (two) times daily before a meal. AM (Patient taking differently: Take 40 mg by mouth every evening. AM), Disp: 60 capsule, Rfl: 3 .  ferrous sulfate 325 (65 FE) MG tablet, Take 325 mg by mouth 2 (two) times daily., Disp: , Rfl:  .  folic acid (FOLVITE) 1 MG tablet, Take 1 mg by mouth every evening. , Disp: , Rfl:  .  HYDROcodone-acetaminophen (NORCO/VICODIN) 5-325 MG tablet, Take 1 tablet by mouth See admin instructions. Take 1 tablet by mouth at bedtime if needed for pain, and Take 1 tablet by mouth 30 min before physical therapy., Disp: , Rfl: 0 .  losartan (COZAAR) 100 MG tablet, Take 100 mg by mouth every morning. , Disp: , Rfl: 0 .  nystatin (MYCOSTATIN) 100000 UNIT/ML suspension, Take 5 mLs (500,000 Units total) by mouth 4 (four) times daily. (Patient taking differently: Take 5 mLs by mouth 4 (four) times daily as needed. For throat pain.), Disp: 60 mL, Rfl: 0 .  omeprazole (PRILOSEC) 40 MG capsule, Take 40 mg by mouth every morning. , Disp: , Rfl: 0 .  sertraline (ZOLOFT) 50 MG tablet, Take 50 mg by mouth every evening. , Disp: ,  Rfl:   Past Medical History: Past Medical History  Diagnosis Date  . Gout   . Hypertension   . Heart murmur   . Aortic stenosis, mild     mild AS by echo, 08/2011  . Chronic kidney disease     kidney stones  . HIV positive ([place]) 09/25/11  . Hepatitis C 09/25/11  . Full dentures     upper and lower  . Hep C w/o coma, chronic ([place]) 01/19/2015  . Anemia   . Depression     Tobacco Use: History  Smoking status  . Former Smoker  Smokeless tobacco  . Former Systems developer  . Quit date: 03/13/1972    Labs: Recent Review Flowsheet Data    Labs for ITP Cardiac and Pulmonary Rehab Latest Ref Rng 11/13/2011 11/10/2015   Cholestrol 0 - 200 mg/dL 132 120   LDLCALC 0 - 99 mg/dL 71 69   HDL >40 mg/dL 36(L) 30(L)   Trlycerides <150 mg/dL 124 107       Exercise Target Goals: Date: 12/11/15  Exercise Program  Goal: Individual exercise prescription set with THRR, safety & activity barriers. Participant demonstrates ability to understand and report RPE using BORG scale, to self-measure pulse accurately, and to acknowledge the importance of the exercise prescription.  Exercise Prescription Goal: Starting with aerobic activity 30 plus minutes a day, 3 days per week for initial exercise prescription. Provide home exercise prescription and guidelines that participant acknowledges understanding prior to discharge.  Activity Barriers & Risk Stratification:     Activity Barriers & Cardiac Risk Stratification - 12/11/15 1536    Activity Barriers & Cardiac Risk Stratification   Activity Barriers Deconditioning;Back Problems   Cardiac Risk Stratification High      6 Minute Walk:     6 Minute Walk      12/11/15 1539       6 Minute Walk   Distance 1174 feet     Walk Time 6 minutes     # of Rest Breaks 0     MPH 2.22     METS 2.84     RPE 14     VO2 Peak 9.95     Symptoms Yes (comment)     Comments chronic back pain 8/10     Resting HR 60 bpm     Resting BP 132/80 mmHg     Max Ex. HR 88 bpm     Max Ex. BP 132/72 mmHg        Initial Exercise Prescription:     Initial Exercise Prescription - 12/11/15 1500    Date of Initial Exercise RX and Referring Provider   Date 12/11/15   Referring Provider Stann Mainland   Treadmill   MPH 2  only if it doesnt cause back pain   Grade 0   Minutes 15   METs 2.53   Recumbant Bike   Level 2   RPM 60   Watts 15   Minutes 15   METs 2.5   NuStep   Level 2   Watts 40   Minutes 15   Recumbant Elliptical   Level 2   RPM 60   Minutes 15   METs 2.5   REL-XR   Level 2   Minutes 15   METs 2.5   T5 Nustep   Level 2   Minutes 15   METs 2.5   Biostep-RELP   Level 2   Minutes 15   METs 2.5   Prescription Details   Frequency (  times per week) 3   Duration Progress to 45 minutes of aerobic exercise without signs/symptoms of physical distress    Intensity   THRR 40-80% of Max Heartrate 98-137   Ratings of Perceived Exertion 11-13   Progression   Progression Continue to progress workloads to maintain intensity without signs/symptoms of physical distress.   Resistance Training   Training Prescription Yes   Weight 2   Reps 10-15      Perform Capillary Blood Glucose checks as needed.  Exercise Prescription Changes:   Exercise Comments:   Discharge Exercise Prescription (Final Exercise Prescription Changes):   Nutrition:  Target Goals: Understanding of nutrition guidelines, daily intake of sodium 1500mg , cholesterol 200mg , calories 30% from fat and 7% or less from saturated fats, daily to have 5 or more servings of fruits and vegetables.  Biometrics:     Pre Biometrics - 12/11/15 1534    Pre Biometrics   Height 5' 10.3" (1.786 m)   Weight 210 lb 14.4 oz (95.664 kg)   Waist Circumference 43.75 inches   Hip Circumference 43 inches   Waist to Hip Ratio 1.02 %   BMI (Calculated) 30.1   Single Leg Stand 23 seconds       Nutrition Therapy Plan and Nutrition Goals:     Nutrition Therapy & Goals - 12/11/15 1538    Nutrition Therapy   Drug/Food Interactions Statins/Certain Fruits   Intervention Plan   Intervention Prescribe, educate and counsel regarding individualized specific dietary modifications aiming towards targeted core components such as weight, hypertension, lipid management, diabetes, heart failure and other comorbidities.;Nutrition handout(s) given to patient.   Expected Outcomes Short Term Goal: Understand basic principles of dietary content, such as calories, fat, sodium, cholesterol and nutrients.;Long Term Goal: Adherence to prescribed nutrition plan.;Short Term Goal: A plan has been developed with personal nutrition goals set during dietitian appointment.      Nutrition Discharge: Rate Your Plate Scores:   Nutrition Goals Re-Evaluation:   Psychosocial: Target Goals: Acknowledge presence or  absence of depression, maximize coping skills, provide positive support system. Participant is able to verbalize types and ability to use techniques and skills needed for reducing stress and depression.  Initial Review & Psychosocial Screening:     Initial Psych Review & Screening - 12/11/15 1539    Initial Review   Current issues with Current Stress Concerns   Family Dynamics   Good Support System? Yes   Screening Interventions   Interventions Encouraged to exercise;Program counselor consult      Quality of Life Scores:     Quality of Life - 12/13/15 1213    Quality of Life Scores   Health/Function Pre 14.2 %   Socioeconomic Pre 24.08 %   Psych/Spiritual Pre 20.57 %   Family Pre 26.4 %   GLOBAL Pre 19.2 %      PHQ-9:     Recent Review Flowsheet Data    Depression screen [facility] 2/9 12/11/2015   Decreased Interest 3   Down, Depressed, Hopeless 3   PHQ - 2 Score 6   Altered sleeping 1   Tired, decreased energy 3   Change in appetite 0   Feeling bad or failure about yourself  2   Trouble concentrating 1   Moving slowly or fidgety/restless 1   Suicidal thoughts 0   PHQ-9 Score 14   Difficult doing work/chores Somewhat difficult      Psychosocial Evaluation and Intervention:   Psychosocial Re-Evaluation:   Vocational Rehabilitation: Provide vocational rehab assistance to  qualifying candidates.   Vocational Rehab Evaluation & Intervention:     Vocational Rehab - 12/11/15 1536    Initial Vocational Rehab Evaluation & Intervention   Assessment shows need for Vocational Rehabilitation No      Education: Education Goals: Education classes will be provided on a weekly basis, covering required topics. Participant will state understanding/return demonstration of topics presented.  Learning Barriers/Preferences:     Learning Barriers/Preferences - 12/11/15 1536    Learning Barriers/Preferences   Learning Barriers None   Learning Preferences  Computer/Internet;Individual Instruction;Group Instruction      Education Topics: General Nutrition Guidelines/Fats and Fiber: -Group instruction provided by verbal, written material, models and posters to present the general guidelines for heart healthy nutrition. Gives an explanation and review of dietary fats and fiber.   Controlling Sodium/Reading Food Labels: -Group verbal and written material supporting the discussion of sodium use in heart healthy nutrition. Review and explanation with models, verbal and written materials for utilization of the food label.   Exercise Physiology & Risk Factors: - Group verbal and written instruction with models to review the exercise physiology of the cardiovascular system and associated critical values. Details cardiovascular disease risk factors and the goals associated with each risk factor.   Aerobic Exercise & Resistance Training: - Gives group verbal and written discussion on the health impact of inactivity. On the components of aerobic and resistive training programs and the benefits of this training and how to safely progress through these programs.   Flexibility, Balance, General Exercise Guidelines: - Provides group verbal and written instruction on the benefits of flexibility and balance training programs. Provides general exercise guidelines with specific guidelines to those with heart or lung disease. Demonstration and skill practice provided.   Stress Management: - Provides group verbal and written instruction about the health risks of elevated stress, cause of high stress, and healthy ways to reduce stress.   Depression: - Provides group verbal and written instruction on the correlation between heart/lung disease and depressed mood, treatment options, and the stigmas associated with seeking treatment.   Anatomy & Physiology of the Heart: - Group verbal and written instruction and models provide basic cardiac anatomy and  physiology, with the coronary electrical and arterial systems. Review of: AMI, Angina, Valve disease, Heart Failure, Cardiac Arrhythmia, Pacemakers, and the ICD.   Cardiac Procedures: - Group verbal and written instruction and models to describe the testing methods done to diagnose heart disease. Reviews the outcomes of the test results. Describes the treatment choices: Medical Management, Angioplasty, or Coronary Bypass Surgery.   Cardiac Medications: - Group verbal and written instruction to review commonly prescribed medications for heart disease. Reviews the medication, class of the drug, and side effects. Includes the steps to properly store meds and maintain the prescription regimen.   Go Sex-Intimacy & Heart Disease, Get SMART - Goal Setting: - Group verbal and written instruction through game format to discuss heart disease and the return to sexual intimacy. Provides group verbal and written material to discuss and apply goal setting through the application of the S.M.A.R.T. Method.   Other Matters of the Heart: - Provides group verbal, written materials and models to describe Heart Failure, Angina, Valve Disease, and Diabetes in the realm of heart disease. Includes description of the disease process and treatment options available to the cardiac patient.   Exercise & Equipment Safety: - Individual verbal instruction and demonstration of equipment use and safety with use of the equipment.  Cardiac Rehab from 12/11/2015 in [facility] Cardiac and Pulmonary Rehab   Date  12/11/15   Educator  C. enterkinRN   Instruction Review Code  1- partially meets, needs review/practice      Infection Prevention: - Provides verbal and written material to individual with discussion of infection control including proper hand washing and proper equipment cleaning during exercise session.      Cardiac Rehab from 12/11/2015 in [facility] Cardiac and Pulmonary Rehab   Date  12/11/15   Educator  C.  EnterkinRN   Instruction Review Code  2- meets goals/outcomes      Falls Prevention: - Provides verbal and written material to individual with discussion of falls prevention and safety.      Cardiac Rehab from 12/11/2015 in [facility] Cardiac and Pulmonary Rehab   Date  12/11/15   Educator  C. [place]   Instruction Review Code  2- meets goals/outcomes      Diabetes: - Individual verbal and written instruction to review signs/symptoms of diabetes, desired ranges of glucose level fasting, after meals and with exercise. Advice that pre and post exercise glucose checks will be done for 3 sessions at entry of program.    Knowledge Questionnaire Score:     Knowledge Questionnaire Score - 12/12/15 1521    Knowledge Questionnaire Score   Pre Score 21      Core Components/Risk Factors/Patient Goals at Admission:     Personal Goals and Risk Factors at Admission - 12/11/15 1539    Core Components/Risk Factors/Patient Goals on Admission   Sedentary Yes   Intervention Provide advice, education, support and counseling about physical activity/exercise needs.;Develop an individualized exercise prescription for aerobic and resistive training based on initial evaluation findings, risk stratification, comorbidities and participant's personal goals.   Expected Outcomes Achievement of increased cardiorespiratory fitness and enhanced flexibility, muscular endurance and strength shown through measurements of functional capacity and personal statement of participant.   Hypertension Yes   Intervention Provide education on lifestyle modifcations including regular physical activity/exercise, weight management, moderate sodium restriction and increased consumption of fresh fruit, vegetables, and low fat dairy, alcohol moderation, and smoking cessation.;Monitor prescription use compliance.   Expected Outcomes Short Term: Continued assessment and intervention until BP is < 140/37mm HG in hypertensive  participants. < 130/48mm HG in hypertensive participants with diabetes, heart failure or chronic kidney disease.;Long Term: Maintenance of blood pressure at goal levels.   Stress Yes   Intervention Offer individual and/or small group education and counseling on adjustment to heart disease, stress management and health-related lifestyle change. Teach and support self-help strategies.;Refer participants experiencing significant psychosocial distress to appropriate mental health specialists for further evaluation and treatment. When possible, include family members and significant others in education/counseling sessions.   Expected Outcomes Short Term: Participant demonstrates changes in health-related behavior, relaxation and other stress management skills, ability to obtain effective social support, and compliance with psychotropic medications if prescribed.;Long Term: Emotional wellbeing is indicated by absence of clinically significant psychosocial distress or social isolation.      Core Components/Risk Factors/Patient Goals Review:    Core Components/Risk Factors/Patient Goals at Discharge (Final Review):    ITP Comments:   Comments:

## 2015-12-17 ENCOUNTER — Encounter: Payer: Medicare Other | Admitting: *Deleted

## 2015-12-17 DIAGNOSIS — I214 Non-ST elevation (NSTEMI) myocardial infarction: Secondary | ICD-10-CM

## 2015-12-17 NOTE — Progress Notes (Signed)
Daily Session Note  Patient Details  Name: JAHMAI FINELLI MRN: [id number] Date of Birth: 14-Jul-1950 Referring Provider:        Cardiac Rehab from 12/11/2015 in [facility] Cardiac and Pulmonary Rehab   Referring Provider  Stann Mainland      Encounter Date: 12/17/2015  Check In:     Session Check In - 12/17/15 0830    Check-In   Location [facility]-Cardiac & Pulmonary Rehab   Staff Present Alberteen Sam, MA, ACSM RCEP, Exercise Physiologist;Ailea Rhatigan, RN, BSN, CCRP;Diane Joya Gaskins, RN, BSN   Supervising physician immediately available to respond to emergencies See telemetry face sheet for immediately available ER MD   Medication changes reported     No   Fall or balance concerns reported    No   Warm-up and Cool-down Performed on first and last piece of equipment   Resistance Training Performed Yes   VAD Patient? No   Pain Assessment   Currently in Pain? No/denies   Multiple Pain Sites No         Goals Met:  Strength training completed today  Goals Unmet:  cardiac anginal symptoms today  Comments: First day.    Dr. Emily Filbert is Medical Director for [place] and [facility].

## 2015-12-17 NOTE — Progress Notes (Signed)
Daily Session Note  Patient Details  Name: Jared Walker MRN: [id number] Date of Birth: [birth year] Referring Provider:        Cardiac Rehab from 12/11/2015 in [facility] Cardiac and Pulmonary Rehab   Referring Provider  Stann Mainland      Encounter Date: 12/17/2015  Check In:     Session Check In - 12/17/15 0830    Check-In   Location [facility]-Cardiac & Pulmonary Rehab   Staff Present Alberteen Sam, MA, ACSM RCEP, Exercise Physiologist;Susanne Bice, RN, BSN, CCRP;Diane Joya Gaskins, RN, BSN   Supervising physician immediately available to respond to emergencies See telemetry face sheet for immediately available ER MD   Medication changes reported     No   Fall or balance concerns reported    No   Warm-up and Cool-down Performed on first and last piece of equipment   Resistance Training Performed Yes   VAD Patient? No   Pain Assessment   Currently in Pain? No/denies   Multiple Pain Sites No         Goals Met:  Exercise tolerated well Personal goals reviewed No report of cardiac concerns or symptoms Strength training completed today  Goals Unmet:  Not Applicable  Comments: First full day of exercise!  Patient was oriented to gym and equipment including functions, settings, policies, and procedures.  Patient's individual exercise prescription and treatment plan were reviewed.  All starting workloads were established based on the results of the 6 minute walk test done at initial orientation visit.  The plan for exercise progression was also introduced and progression will be customized based on patient's performance and goals.    Dr. Emily Filbert is Medical Director for [place] and [facility].

## 2015-12-18 ENCOUNTER — Encounter: Payer: Self-pay | Admitting: *Deleted

## 2015-12-18 DIAGNOSIS — I214 Non-ST elevation (NSTEMI) myocardial infarction: Secondary | ICD-10-CM

## 2015-12-18 NOTE — Progress Notes (Signed)
Cardiac Individual Treatment Plan  Patient Details  Name: Jared Walker MRN: [id number] Date of Birth: 1951-03-11 Referring Provider:        Cardiac Rehab from 12/11/2015 in [facility] Cardiac and Pulmonary Rehab   Referring Provider  Stann Mainland      Initial Encounter Date:       Cardiac Rehab from 12/11/2015 in [facility] Cardiac and Pulmonary Rehab   Date  12/11/15   Referring Provider  Stann Mainland      Visit Diagnosis: NSTEMI (non-ST elevated myocardial infarction) [facility])  Patient's Home Medications on Admission:  Current outpatient prescriptions:  .  allopurinol (ZYLOPRIM) 100 MG tablet, Take 200 mg by mouth daily. *Take along with 300 mg tablet to equal total dose of 500 mg daily.*, Disp: , Rfl:  .  allopurinol (ZYLOPRIM) 300 MG tablet, Take 300 mg by mouth See admin instructions. *Take along with 2 tablets of 100 mg (200 mg) to equal total dose of 500 mg by mouth daily*, Disp: , Rfl:  .  amLODipine (NORVASC) 10 MG tablet, Take 10 mg by mouth., Disp: , Rfl:  .  aspirin EC 81 MG EC tablet, Take 1 tablet (81 mg total) by mouth daily., Disp: 120 tablet, Rfl: 0 .  atenolol (TENORMIN) 100 MG tablet, Take 100 mg by mouth every evening. , Disp: , Rfl:  .  atorvastatin (LIPITOR) 10 MG tablet, Take 80 mg by mouth., Disp: , Rfl:  .  Cholecalciferol (VITAMIN D-1000 MAX ST) 1000 units tablet, Take 1,000 Units by mouth daily., Disp: , Rfl:  .  cloNIDine (CATAPRES) 0.1 MG tablet, Take 0.1 mg by mouth at bedtime. , Disp: , Rfl:  .  Cyanocobalamin (RA VITAMIN B-12 TR) 1000 MCG TBCR, Take 1,000 mcg by mouth daily., Disp: , Rfl:  .  DESCOVY 200-25 MG tablet, Take 1 tablet by mouth at bedtime. , Disp: , Rfl: 3 .  diazepam (VALIUM) 5 MG tablet, Take 5 mg by mouth at bedtime. For sleep, Disp: , Rfl:  .  docusate sodium (COLACE) 100 MG capsule, Take 100 mg by mouth at bedtime. , Disp: , Rfl:  .  dolutegravir (TIVICAY) 50 MG tablet, Take 50 mg by mouth at bedtime. , Disp: , Rfl:  .  esomeprazole (NEXIUM) 40 MG  capsule, Take 1 capsule (40 mg total) by mouth 2 (two) times daily before a meal. AM (Patient taking differently: Take 40 mg by mouth every evening. AM), Disp: 60 capsule, Rfl: 3 .  ferrous sulfate 325 (65 FE) MG tablet, Take 325 mg by mouth 2 (two) times daily., Disp: , Rfl:  .  folic acid (FOLVITE) 1 MG tablet, Take 1 mg by mouth every evening. , Disp: , Rfl:  .  HYDROcodone-acetaminophen (NORCO/VICODIN) 5-325 MG tablet, Take 1 tablet by mouth See admin instructions. Take 1 tablet by mouth at bedtime if needed for pain, and Take 1 tablet by mouth 30 min before physical therapy., Disp: , Rfl: 0 .  losartan (COZAAR) 100 MG tablet, Take 100 mg by mouth every morning. , Disp: , Rfl: 0 .  nystatin (MYCOSTATIN) 100000 UNIT/ML suspension, Take 5 mLs (500,000 Units total) by mouth 4 (four) times daily. (Patient taking differently: Take 5 mLs by mouth 4 (four) times daily as needed. For throat pain.), Disp: 60 mL, Rfl: 0 .  omeprazole (PRILOSEC) 40 MG capsule, Take 40 mg by mouth every morning. , Disp: , Rfl: 0 .  sertraline (ZOLOFT) 50 MG tablet, Take 50 mg by mouth every evening. , Disp: ,  Rfl:   Past Medical History: Past Medical History  Diagnosis Date  . Gout   . Hypertension   . Heart murmur   . Aortic stenosis, mild     mild AS by echo, 08/2011  . Chronic kidney disease     kidney stones  . HIV positive ([place]) 09/25/11  . Hepatitis C 09/25/11  . Full dentures     upper and lower  . Hep C w/o coma, chronic ([place]) 01/19/2015  . Anemia   . Depression     Tobacco Use: History  Smoking status  . Former Smoker  Smokeless tobacco  . Former Systems developer  . Quit date: 03/13/1972    Labs: Recent Review Flowsheet Data    Labs for ITP Cardiac and Pulmonary Rehab Latest Ref Rng 11/13/2011 11/10/2015   Cholestrol 0 - 200 mg/dL 132 120   LDLCALC 0 - 99 mg/dL 71 69   HDL >40 mg/dL 36(L) 30(L)   Trlycerides <150 mg/dL 124 107       Exercise Target Goals:    Exercise Program Goal: Individual  exercise prescription set with THRR, safety & activity barriers. Participant demonstrates ability to understand and report RPE using BORG scale, to self-measure pulse accurately, and to acknowledge the importance of the exercise prescription.  Exercise Prescription Goal: Starting with aerobic activity 30 plus minutes a day, 3 days per week for initial exercise prescription. Provide home exercise prescription and guidelines that participant acknowledges understanding prior to discharge.  Activity Barriers & Risk Stratification:     Activity Barriers & Cardiac Risk Stratification - 12/11/15 1536    Activity Barriers & Cardiac Risk Stratification   Activity Barriers Deconditioning;Back Problems   Cardiac Risk Stratification High      6 Minute Walk:     6 Minute Walk      12/11/15 1539       6 Minute Walk   Distance 1174 feet     Walk Time 6 minutes     # of Rest Breaks 0     MPH 2.22     METS 2.84     RPE 14     VO2 Peak 9.95     Symptoms Yes (comment)     Comments chronic back pain 8/10     Resting HR 60 bpm     Resting BP 132/80 mmHg     Max Ex. HR 88 bpm     Max Ex. BP 132/72 mmHg        Initial Exercise Prescription:     Initial Exercise Prescription - 12/11/15 1500    Date of Initial Exercise RX and Referring Provider   Date 12/11/15   Referring Provider Stann Mainland   Treadmill   MPH 2  only if it doesnt cause back pain   Grade 0   Minutes 15   METs 2.53   Recumbant Bike   Level 2   RPM 60   Watts 15   Minutes 15   METs 2.5   NuStep   Level 2   Watts 40   Minutes 15   Recumbant Elliptical   Level 2   RPM 60   Minutes 15   METs 2.5   REL-XR   Level 2   Minutes 15   METs 2.5   T5 Nustep   Level 2   Minutes 15   METs 2.5   Biostep-RELP   Level 2   Minutes 15   METs 2.5   Prescription Details   Frequency (  times per week) 3   Duration Progress to 45 minutes of aerobic exercise without signs/symptoms of physical distress   Intensity   THRR  40-80% of Max Heartrate 98-137   Ratings of Perceived Exertion 11-13   Progression   Progression Continue to progress workloads to maintain intensity without signs/symptoms of physical distress.   Resistance Training   Training Prescription Yes   Weight 2   Reps 10-15      Perform Capillary Blood Glucose checks as needed.  Exercise Prescription Changes:   Exercise Comments:     Exercise Comments      12/17/15 0831 12/17/15 0954         Exercise Comments First full day of exercise!  Patient was oriented to gym and equipment including functions, settings, policies, and procedures.  Patient's individual exercise prescription and treatment plan were reviewed.  All starting workloads were established based on the results of the 6 minute walk test done at initial orientation visit.  The plan for exercise progression was also introduced and progression will be customized based on patient's performance and goals. Tolbert experienced 5/10 anginal symptoms.  Resolved with rest. Talking with Birdena Crandall, he states that he has had an increased use of his ntg over the past week. Swelling in Right leg greater than leg leg swelling. He was advised to call Dr Dennie Maizes office today to see MD as soon as possibel ,today or this week. He was also advised if symptoms increase he needs to gfo to ED if not able to see MD.          Discharge Exercise Prescription (Final Exercise Prescription Changes):   Nutrition:  Target Goals: Understanding of nutrition guidelines, daily intake of sodium 1500mg , cholesterol 200mg , calories 30% from fat and 7% or less from saturated fats, daily to have 5 or more servings of fruits and vegetables.  Biometrics:     Pre Biometrics - 12/11/15 1534    Pre Biometrics   Height 5' 10.3" (1.786 m)   Weight 210 lb 14.4 oz (95.664 kg)   Waist Circumference 43.75 inches   Hip Circumference 43 inches   Waist to Hip Ratio 1.02 %   BMI (Calculated) 30.1   Single Leg Stand 23  seconds       Nutrition Therapy Plan and Nutrition Goals:     Nutrition Therapy & Goals - 12/11/15 1538    Nutrition Therapy   Drug/Food Interactions Statins/Certain Fruits   Intervention Plan   Intervention Prescribe, educate and counsel regarding individualized specific dietary modifications aiming towards targeted core components such as weight, hypertension, lipid management, diabetes, heart failure and other comorbidities.;Nutrition handout(s) given to patient.   Expected Outcomes Short Term Goal: Understand basic principles of dietary content, such as calories, fat, sodium, cholesterol and nutrients.;Long Term Goal: Adherence to prescribed nutrition plan.;Short Term Goal: A plan has been developed with personal nutrition goals set during dietitian appointment.      Nutrition Discharge: Rate Your Plate Scores:   Nutrition Goals Re-Evaluation:   Psychosocial: Target Goals: Acknowledge presence or absence of depression, maximize coping skills, provide positive support system. Participant is able to verbalize types and ability to use techniques and skills needed for reducing stress and depression.  Initial Review & Psychosocial Screening:     Initial Psych Review & Screening - 12/11/15 1539    Initial Review   Current issues with Current Stress Concerns   Family Dynamics   Good Support System? Yes   Screening Interventions   Interventions Encouraged  to exercise;Program counselor consult      Quality of Life Scores:     Quality of Life - 12/13/15 1213    Quality of Life Scores   Health/Function Pre 14.2 %   Socioeconomic Pre 24.08 %   Psych/Spiritual Pre 20.57 %   Family Pre 26.4 %   GLOBAL Pre 19.2 %      PHQ-9:     Recent Review Flowsheet Data    Depression screen [facility] 2/9 12/11/2015   Decreased Interest 3   Down, Depressed, Hopeless 3   PHQ - 2 Score 6   Altered sleeping 1   Tired, decreased energy 3   Change in appetite 0   Feeling bad or failure  about yourself  2   Trouble concentrating 1   Moving slowly or fidgety/restless 1   Suicidal thoughts 0   PHQ-9 Score 14   Difficult doing work/chores Somewhat difficult      Psychosocial Evaluation and Intervention:   Psychosocial Re-Evaluation:   Vocational Rehabilitation: Provide vocational rehab assistance to qualifying candidates.   Vocational Rehab Evaluation & Intervention:     Vocational Rehab - 12/11/15 1536    Initial Vocational Rehab Evaluation & Intervention   Assessment shows need for Vocational Rehabilitation No      Education: Education Goals: Education classes will be provided on a weekly basis, covering required topics. Participant will state understanding/return demonstration of topics presented.  Learning Barriers/Preferences:     Learning Barriers/Preferences - 12/11/15 1536    Learning Barriers/Preferences   Learning Barriers None   Learning Preferences Computer/Internet;Individual Instruction;Group Instruction      Education Topics: General Nutrition Guidelines/Fats and Fiber: -Group instruction provided by verbal, written material, models and posters to present the general guidelines for heart healthy nutrition. Gives an explanation and review of dietary fats and fiber.   Controlling Sodium/Reading Food Labels: -Group verbal and written material supporting the discussion of sodium use in heart healthy nutrition. Review and explanation with models, verbal and written materials for utilization of the food label.   Exercise Physiology & Risk Factors: - Group verbal and written instruction with models to review the exercise physiology of the cardiovascular system and associated critical values. Details cardiovascular disease risk factors and the goals associated with each risk factor.   Aerobic Exercise & Resistance Training: - Gives group verbal and written discussion on the health impact of inactivity. On the components of aerobic and  resistive training programs and the benefits of this training and how to safely progress through these programs.   Flexibility, Balance, General Exercise Guidelines: - Provides group verbal and written instruction on the benefits of flexibility and balance training programs. Provides general exercise guidelines with specific guidelines to those with heart or lung disease. Demonstration and skill practice provided.   Stress Management: - Provides group verbal and written instruction about the health risks of elevated stress, cause of high stress, and healthy ways to reduce stress.   Depression: - Provides group verbal and written instruction on the correlation between heart/lung disease and depressed mood, treatment options, and the stigmas associated with seeking treatment.   Anatomy & Physiology of the Heart: - Group verbal and written instruction and models provide basic cardiac anatomy and physiology, with the coronary electrical and arterial systems. Review of: AMI, Angina, Valve disease, Heart Failure, Cardiac Arrhythmia, Pacemakers, and the ICD.   Cardiac Procedures: - Group verbal and written instruction and models to describe the testing methods done to diagnose heart disease. Reviews the outcomes  of the test results. Describes the treatment choices: Medical Management, Angioplasty, or Coronary Bypass Surgery.   Cardiac Medications: - Group verbal and written instruction to review commonly prescribed medications for heart disease. Reviews the medication, class of the drug, and side effects. Includes the steps to properly store meds and maintain the prescription regimen.          Cardiac Rehab from 12/17/2015 in [facility] Cardiac and Pulmonary Rehab   Date  12/17/15 [Second Part]   Educator  SB   Instruction Review Code  2- meets goals/outcomes      Go Sex-Intimacy & Heart Disease, Get SMART - Goal Setting: - Group verbal and written instruction through game format to discuss  heart disease and the return to sexual intimacy. Provides group verbal and written material to discuss and apply goal setting through the application of the S.M.A.R.T. Method.   Other Matters of the Heart: - Provides group verbal, written materials and models to describe Heart Failure, Angina, Valve Disease, and Diabetes in the realm of heart disease. Includes description of the disease process and treatment options available to the cardiac patient.   Exercise & Equipment Safety: - Individual verbal instruction and demonstration of equipment use and safety with use of the equipment.      Cardiac Rehab from 12/17/2015 in [facility] Cardiac and Pulmonary Rehab   Date  12/11/15   Educator  C. enterkinRN   Instruction Review Code  1- partially meets, needs review/practice      Infection Prevention: - Provides verbal and written material to individual with discussion of infection control including proper hand washing and proper equipment cleaning during exercise session.      Cardiac Rehab from 12/17/2015 in [facility] Cardiac and Pulmonary Rehab   Date  12/11/15   Educator  C. EnterkinRN   Instruction Review Code  2- meets goals/outcomes      Falls Prevention: - Provides verbal and written material to individual with discussion of falls prevention and safety.      Cardiac Rehab from 12/17/2015 in [facility] Cardiac and Pulmonary Rehab   Date  12/11/15   Educator  C. [place]   Instruction Review Code  2- meets goals/outcomes      Diabetes: - Individual verbal and written instruction to review signs/symptoms of diabetes, desired ranges of glucose level fasting, after meals and with exercise. Advice that pre and post exercise glucose checks will be done for 3 sessions at entry of program.    Knowledge Questionnaire Score:     Knowledge Questionnaire Score - 12/12/15 1521    Knowledge Questionnaire Score   Pre Score 21      Core Components/Risk Factors/Patient Goals at Admission:      Personal Goals and Risk Factors at Admission - 12/11/15 1539    Core Components/Risk Factors/Patient Goals on Admission   Sedentary Yes   Intervention Provide advice, education, support and counseling about physical activity/exercise needs.;Develop an individualized exercise prescription for aerobic and resistive training based on initial evaluation findings, risk stratification, comorbidities and participant's personal goals.   Expected Outcomes Achievement of increased cardiorespiratory fitness and enhanced flexibility, muscular endurance and strength shown through measurements of functional capacity and personal statement of participant.   Hypertension Yes   Intervention Provide education on lifestyle modifcations including regular physical activity/exercise, weight management, moderate sodium restriction and increased consumption of fresh fruit, vegetables, and low fat dairy, alcohol moderation, and smoking cessation.;Monitor prescription use compliance.   Expected Outcomes Short Term: Continued assessment and intervention until BP  is < 140/34mm HG in hypertensive participants. < 130/50mm HG in hypertensive participants with diabetes, heart failure or chronic kidney disease.;Long Term: Maintenance of blood pressure at goal levels.   Stress Yes   Intervention Offer individual and/or small group education and counseling on adjustment to heart disease, stress management and health-related lifestyle change. Teach and support self-help strategies.;Refer participants experiencing significant psychosocial distress to appropriate mental health specialists for further evaluation and treatment. When possible, include family members and significant others in education/counseling sessions.   Expected Outcomes Short Term: Participant demonstrates changes in health-related behavior, relaxation and other stress management skills, ability to obtain effective social support, and compliance with psychotropic  medications if prescribed.;Long Term: Emotional wellbeing is indicated by absence of clinically significant psychosocial distress or social isolation.      Core Components/Risk Factors/Patient Goals Review:    Core Components/Risk Factors/Patient Goals at Discharge (Final Review):    ITP Comments:     ITP Comments      12/17/15 0831 12/17/15 0957 12/18/15 0914       ITP Comments Today was Vinh's first full day.  We will continue to follow ITP. Kareen experienced 5/10 anginal symptoms.  Resolved with rest. Talking with Birdena Crandall, he states that he has had an increased use of his ntg over the past week. Swelling in Right leg greater than leg leg swelling. He was advised to call Dr Dennie Maizes office today to see MD as soon as possibel ,today or this week. He was also advised if symptoms increase he needs to gfo to ED if not able to see MD 30 day review. Continue with ITP        Comments:

## 2015-12-19 DIAGNOSIS — I214 Non-ST elevation (NSTEMI) myocardial infarction: Secondary | ICD-10-CM

## 2015-12-19 NOTE — Progress Notes (Signed)
Daily Session Note  Patient Details  Name: Jared Walker MRN: [id number] Date of Birth: 06/06/1950 Referring Provider:        Cardiac Rehab from 12/11/2015 in [facility] Cardiac and Pulmonary Rehab   Referring Provider  Stann Mainland      Encounter Date: 12/19/2015  Check In:     Session Check In - 12/19/15 0847    Check-In   Location [facility]-Cardiac & Pulmonary Rehab   Staff Present Alberteen Sam, MA, ACSM RCEP, Exercise Physiologist;Amanda Oletta Darter, BA, ACSM CEP, Exercise Physiologist;Diane Joya Gaskins, RN, BSN   Supervising physician immediately available to respond to emergencies See telemetry face sheet for immediately available ER MD   Medication changes reported     No   Fall or balance concerns reported    No   Warm-up and Cool-down Performed on first and last piece of equipment   Resistance Training Performed Yes   VAD Patient? No   Pain Assessment   Currently in Pain? No/denies         Goals Met:  Independence with exercise equipment Exercise tolerated well No report of cardiac concerns or symptoms Strength training completed today  Goals Unmet:  Not Applicable  Comments: Pt able to follow exercise prescription today without complaint.  Will continue to monitor for progression.   Dr. Emily Filbert is Medical Director for [facility].

## 2015-12-24 ENCOUNTER — Encounter: Payer: Medicare Other | Admitting: *Deleted

## 2015-12-24 DIAGNOSIS — I214 Non-ST elevation (NSTEMI) myocardial infarction: Secondary | ICD-10-CM | POA: Diagnosis not present

## 2015-12-24 NOTE — Progress Notes (Signed)
Daily Session Note  Patient Details  Name: Jared Walker MRN: [id number] Date of Birth: 31-May-1951 Referring Provider:   Flowsheet Row Cardiac Rehab from 12/11/2015 in [facility] Cardiac and Pulmonary Rehab  Referring Provider  Stann Mainland      Encounter Date: 12/24/2015  Check In:     Session Check In - 12/24/15 0845      Check-In   Location [facility]-Cardiac & Pulmonary Rehab   Staff Present Alberteen Sam, MA, ACSM RCEP, Exercise Physiologist;Carroll Enterkin, RN, BSN   Supervising physician immediately available to respond to emergencies See telemetry face sheet for immediately available ER MD   Medication changes reported     No   Fall or balance concerns reported    No   Warm-up and Cool-down Performed on first and last piece of equipment   Resistance Training Performed Yes   VAD Patient? No     Pain Assessment   Currently in Pain? No/denies   Multiple Pain Sites No         Goals Met:  Exercise tolerated well No report of cardiac concerns or symptoms Strength training completed today  Goals Unmet:  Not Applicable  Comments: Doing well with exercise prescription progression.    Dr. Emily Filbert is Medical Director for [place] and [facility].

## 2015-12-24 NOTE — Progress Notes (Signed)
Daily Session Note  Patient Details  Name: Jared Walker MRN: [id number] Date of Birth: Apr 04, 1951 Referring Provider:   Flowsheet Row Cardiac Rehab from 12/11/2015 in [facility] Cardiac and Pulmonary Rehab  Referring Provider  Stann Mainland      Encounter Date: 12/24/2015  Check In:     Session Check In - 12/24/15 0845      Check-In   Location [facility]-Cardiac & Pulmonary Rehab   Staff Present Alberteen Sam, MA, ACSM RCEP, Exercise Physiologist;Carroll Enterkin, RN, BSN   Supervising physician immediately available to respond to emergencies See telemetry face sheet for immediately available ER MD   Medication changes reported     No   Fall or balance concerns reported    No   Warm-up and Cool-down Performed on first and last piece of equipment   Resistance Training Performed Yes   VAD Patient? No     Pain Assessment   Currently in Pain? No/denies   Multiple Pain Sites No         Goals Met:  Independence with exercise equipment Exercise tolerated well No report of cardiac concerns or symptoms Strength training completed today  Goals Unmet:  Not Applicable  Comments: Pt able to follow exercise prescription today without complaint.  Will continue to monitor for progression.    Dr. Emily Filbert is Medical Director for [place] and [facility].

## 2015-12-31 ENCOUNTER — Encounter: Payer: Medicare Other | Attending: Internal Medicine | Admitting: *Deleted

## 2015-12-31 DIAGNOSIS — I214 Non-ST elevation (NSTEMI) myocardial infarction: Secondary | ICD-10-CM | POA: Diagnosis not present

## 2015-12-31 NOTE — Progress Notes (Signed)
Daily Session Note  Patient Details  Name: Jared Walker MRN: [id number] Date of Birth: September 10, 1950 Referring Provider:   Flowsheet Row Cardiac Rehab from 12/11/2015 in [facility] Cardiac and Pulmonary Rehab  Referring Provider  Stann Mainland      Encounter Date: 12/31/2015  Check In:     Session Check In - 12/31/15 0272      Check-In   Location [facility]-Cardiac & Pulmonary Rehab   Staff Present Jeanell Sparrow, DPT, CEEA;Diane Joya Gaskins, RN, BSN;Susanne Bice, RN, BSN, [facility]   Supervising physician immediately available to respond to emergencies See telemetry face sheet for immediately available ER MD   Medication changes reported     No   Fall or balance concerns reported    No   Warm-up and Cool-down Performed on first and last piece of equipment   Resistance Training Performed Yes   VAD Patient? No     Pain Assessment   Currently in Pain? No/denies         Goals Met:  Independence with exercise equipment Exercise tolerated well No report of cardiac concerns or symptoms Strength training completed today  Goals Unmet:  Not Applicable  Comments:  Jared Walker had a 6 pound weight increase today.  Jared Walker reported not taking his Lasix since last week due to it keeping him up at night. Jared Walker stated Jared Walker had been taking his daily prescribed dose of Lasix at 9 p.m. or 10 p.m.  nightly.   Re-educated patient about better times to take Lasix 20 mg once a day as prescribed.  Jared Walker plans to resume taking Lasix daily before 5 p.m. every day.   Pt able to follow exercise prescription today without complaint.  Will continue to monitor for progression.    Jared Walker is Medical Director for [place] [facility].

## 2016-01-02 ENCOUNTER — Encounter: Payer: Medicare Other | Admitting: *Deleted

## 2016-01-02 DIAGNOSIS — I214 Non-ST elevation (NSTEMI) myocardial infarction: Secondary | ICD-10-CM

## 2016-01-02 NOTE — Progress Notes (Signed)
Daily Session Note  Patient Details  Name: ZHYON ANTENUCCI MRN: [id number] Date of Birth: 05/28/1951 Referring Provider:   Flowsheet Row Cardiac Rehab from 12/11/2015 in [facility] Cardiac and Pulmonary Rehab  Referring Provider  Stann Mainland      Encounter Date: 01/02/2016  Check In:     Session Check In - 01/02/16 1020      Check-In   Location [facility]-Cardiac & Pulmonary Rehab   Staff Present Nada Maclachlan, BA, ACSM CEP, Exercise Physiologist;Pawan Knechtel Luan Pulling, MA, ACSM RCEP, Exercise Physiologist;Diane Joya Gaskins, RN, BSN   Supervising physician immediately available to respond to emergencies See telemetry face sheet for immediately available ER MD   Medication changes reported     No   Fall or balance concerns reported    No   Warm-up and Cool-down Performed on first and last piece of equipment   Resistance Training Performed Yes   VAD Patient? No     Pain Assessment   Currently in Pain? No/denies   Multiple Pain Sites No         Goals Met:  Independence with exercise equipment Achieving weight loss Exercise tolerated well No report of cardiac concerns or symptoms Strength training completed today  Goals Unmet:  Not Applicable  Comments: Pt able to follow exercise prescription today without complaint.  Will continue to monitor for progression. Earon's weight was down today 4 lbs.  He has started taking his lasix in the morning now.   Dr. Emily Filbert is Medical Director for [place] and [facility].

## 2016-01-07 ENCOUNTER — Other Ambulatory Visit: Payer: Self-pay

## 2016-01-07 ENCOUNTER — Encounter: Payer: Self-pay | Admitting: Emergency Medicine

## 2016-01-07 ENCOUNTER — Emergency Department
Admission: EM | Admit: 2016-01-07 | Discharge: 2016-01-08 | Disposition: A | Discharge status: Home / Self Care | Payer: Medicare Other | Attending: Emergency Medicine | Admitting: Emergency Medicine

## 2016-01-07 ENCOUNTER — Encounter: Payer: Medicare Other | Admitting: *Deleted

## 2016-01-07 ENCOUNTER — Emergency Department: Payer: Medicare Other

## 2016-01-07 DIAGNOSIS — R600 Localized edema: Secondary | ICD-10-CM | POA: Diagnosis not present

## 2016-01-07 DIAGNOSIS — R079 Chest pain, unspecified: Secondary | ICD-10-CM

## 2016-01-07 DIAGNOSIS — R0789 Other chest pain: Secondary | ICD-10-CM | POA: Diagnosis not present

## 2016-01-07 DIAGNOSIS — I2510 Atherosclerotic heart disease of native coronary artery without angina pectoris: Secondary | ICD-10-CM | POA: Insufficient documentation

## 2016-01-07 DIAGNOSIS — Z79899 Other long term (current) drug therapy: Secondary | ICD-10-CM | POA: Diagnosis not present

## 2016-01-07 DIAGNOSIS — Z87891 Personal history of nicotine dependence: Secondary | ICD-10-CM | POA: Diagnosis not present

## 2016-01-07 DIAGNOSIS — Z7982 Long term (current) use of aspirin: Secondary | ICD-10-CM | POA: Insufficient documentation

## 2016-01-07 DIAGNOSIS — Z21 Asymptomatic human immunodeficiency virus [HIV] infection status: Secondary | ICD-10-CM | POA: Diagnosis not present

## 2016-01-07 DIAGNOSIS — I129 Hypertensive chronic kidney disease with stage 1 through stage 4 chronic kidney disease, or unspecified chronic kidney disease: Secondary | ICD-10-CM | POA: Diagnosis not present

## 2016-01-07 DIAGNOSIS — N183 Chronic kidney disease, stage 3 (moderate): Secondary | ICD-10-CM | POA: Insufficient documentation

## 2016-01-07 DIAGNOSIS — I214 Non-ST elevation (NSTEMI) myocardial infarction: Secondary | ICD-10-CM

## 2016-01-07 DIAGNOSIS — I252 Old myocardial infarction: Secondary | ICD-10-CM | POA: Diagnosis not present

## 2016-01-07 HISTORY — DX: Atherosclerotic heart disease of native coronary artery without angina pectoris: I25.10

## 2016-01-07 HISTORY — DX: Acute myocardial infarction, unspecified: I21.9

## 2016-01-07 LAB — BASIC METABOLIC PANEL
ANION GAP: 9 (ref 5–15)
BUN: 28 mg/dL — AB (ref 6–20)
CO2: 27 mmol/L (ref 22–32)
Calcium: 9.6 mg/dL (ref 8.9–10.3)
Chloride: 106 mmol/L (ref 101–111)
Creatinine, Ser: 2.32 mg/dL — ABNORMAL HIGH (ref 0.61–1.24)
GFR calc Af Amer: 32 mL/min — ABNORMAL LOW (ref >60)
GFR, EST NON AFRICAN AMERICAN: 28 mL/min — AB (ref >60)
GLUCOSE: 111 mg/dL — AB (ref 65–99)
POTASSIUM: 4.1 mmol/L (ref 3.5–5.1)
Sodium: 142 mmol/L (ref 135–145)

## 2016-01-07 LAB — CBC
HEMATOCRIT: 36.5 % — AB (ref 40.0–52.0)
HEMOGLOBIN: 12.4 g/dL — AB (ref 13.0–18.0)
MCH: 31.4 pg (ref 26.0–34.0)
MCHC: 34 g/dL (ref 32.0–36.0)
MCV: 92.3 fL (ref 80.0–100.0)
Platelets: 146 10*3/uL — ABNORMAL LOW (ref 150–440)
RBC: 3.96 MIL/uL — ABNORMAL LOW (ref 4.40–5.90)
RDW: 15 % — AB (ref 11.5–14.5)
WBC: 7.9 10*3/uL (ref 3.8–10.6)

## 2016-01-07 LAB — TROPONIN I: Troponin I: <0.03 ng/mL (ref <0.03)

## 2016-01-07 MED ORDER — ONDANSETRON HCL 4 MG/2ML IJ SOLN
4.0000 mg | Freq: Once | INTRAMUSCULAR | Status: AC
  Administered 2016-01-07: 4 mg via INTRAVENOUS
  Filled 2016-01-07: qty 2

## 2016-01-07 MED ORDER — MORPHINE SULFATE (PF) 4 MG/ML IV SOLN
4.0000 mg | Freq: Once | INTRAVENOUS | Status: AC
  Administered 2016-01-07: 4 mg via INTRAVENOUS
  Filled 2016-01-07: qty 1

## 2016-01-07 MED ORDER — FUROSEMIDE 10 MG/ML IJ SOLN
40.0000 mg | Freq: Once | INTRAMUSCULAR | Status: AC
  Administered 2016-01-07: 40 mg via INTRAVENOUS
  Filled 2016-01-07: qty 4

## 2016-01-07 NOTE — ED Provider Notes (Signed)
[facility] Emergency Department Provider Note  Time seen: 9:53 PM  I have reviewed the triage vital signs and the nursing notes.   HISTORY  Chief Complaint Chest Pain and Shortness of Breath    HPI Jared Walker is a 65 y.o. male with a past medical history of hypertension, HIV, hepatitis C, peripheral edema, who presents to the emergency department with weight gain and increased peripheral edema. According to the patient for the past 5 or 6 days he has noticed a considerable weight cane 10-15 pounds, along with increasing fluid in his legs and abdomen. This afternoon he states he began feeling chest discomfort as well. He states the chest pain was a sharp pain located mostly in the center of his chest, it has resolved at this time. Denies any trouble breathing. Denies any diaphoresis or nausea. Patient states he called his doctor and was referred to the emergency department due to the weight gain.  Past Medical History:  Diagnosis Date  . Anemia   . Aortic stenosis, mild    mild AS by echo, 08/2011  . Chronic kidney disease    kidney stones  . Coronary artery disease   . Depression   . Full dentures    upper and lower  . Gout   . Heart murmur   . Hep C w/o coma, chronic ([place]) 01/19/2015  . Hepatitis C 09/25/11  . HIV positive ([place]) 09/25/11  . Hypertension   . MI (myocardial infarction) [facility])     Patient Active Problem List   Diagnosis Date Noted  . BP (high blood pressure) 12/11/2015  . Acute non-ST elevation myocardial infarction (NSTEMI) ([place]) 11/13/2015  . Atypical chest pain 11/09/2015  . Bradycardia 11/09/2015  . Abnormal EKG 11/09/2015  . Iron deficiency anemia 08/27/2015  . Chest pain 01/19/2015  . Anemia 01/19/2015  . GI bleed 01/19/2015  . Pica in adults 01/19/2015  . CKD (chronic kidney disease), stage III 01/19/2015  . HIV (human immunodeficiency virus infection) ([place]) 01/19/2015  . HTN (hypertension) 01/19/2015  . Hep C w/o  coma, chronic ([place]) 01/19/2015  . CKD (chronic kidney disease) 01/19/2015  . History of arthroscopy of shoulder 11/13/2014    Past Surgical History:  Procedure Laterality Date  . BACK SURGERY    . CARDIOVASCULAR STRESS TEST     at [facility]  . CHOLECYSTECTOMY    . CLOSED REDUCTION PELVIC FRACTURE    . ESOPHAGOGASTRODUODENOSCOPY (EGD) WITH PROPOFOL N/A 01/21/2015   Procedure: ESOPHAGOGASTRODUODENOSCOPY (EGD) WITH PROPOFOL;  Surgeon: Manya Silvas, MD;  Location: [facility] ENDOSCOPY;  Service: Endoscopy;  Laterality: N/A;  . ESOPHAGOGASTRODUODENOSCOPY (EGD) WITH PROPOFOL N/A 09/04/2015   Procedure: ESOPHAGOGASTRODUODENOSCOPY (EGD) WITH PROPOFOL;  Surgeon: Manya Silvas, MD;  Location: [facility] ENDOSCOPY;  Service: Endoscopy;  Laterality: N/A;  . FRACTURE SURGERY    . KNEE ARTHROSCOPY  2009   Right  . LUMBAR LAMINECTOMY/DECOMPRESSION MICRODISCECTOMY  09/25/2011   Procedure: LUMBAR LAMINECTOMY/DECOMPRESSION MICRODISCECTOMY;  Surgeon: Erline Levine, MD;  Location: [place] NEURO ORS;  Service: Neurosurgery;  Laterality: Left;  Left Lumbar Four-Five Microdiskectomy  . NEPHROSTOMY     tube Left  . SHOULDER ARTHROSCOPY WITH OPEN ROTATOR CUFF REPAIR Right 10/12/2014   Procedure: SHOULDER ARTHROSCOP with decompression;  Surgeon: Leanor Kail, MD;  Location: [place];  Service: Orthopedics;  Laterality: Right;    Prior to Admission medications   Medication Sig Start Date End Date Taking? Authorizing Provider  allopurinol (ZYLOPRIM) 100 MG tablet Take 200 mg by mouth daily. *  Take along with 300 mg tablet to equal total dose of 500 mg daily.*    Historical Provider, MD  allopurinol (ZYLOPRIM) 300 MG tablet Take 300 mg by mouth See admin instructions. *Take along with 2 tablets of 100 mg (200 mg) to equal total dose of 500 mg by mouth daily*    Historical Provider, MD  amLODipine (NORVASC) 10 MG tablet Take 10 mg by mouth. 11/13/15 11/12/16  Historical Provider, MD  aspirin EC 81 MG EC tablet Take 1 tablet  (81 mg total) by mouth daily. 11/11/15   Bettey Costa, MD  atenolol (TENORMIN) 100 MG tablet Take 100 mg by mouth every evening.     Historical Provider, MD  atorvastatin (LIPITOR) 10 MG tablet Take 80 mg by mouth. 11/13/15 12/13/15  Historical Provider, MD  Cholecalciferol (VITAMIN D-1000 MAX ST) 1000 units tablet Take 1,000 Units by mouth daily.    Historical Provider, MD  cloNIDine (CATAPRES) 0.1 MG tablet Take 0.1 mg by mouth at bedtime.  01/08/15 01/08/16  Historical Provider, MD  Cyanocobalamin (RA VITAMIN B-12 TR) 1000 MCG TBCR Take 1,000 mcg by mouth daily.    Historical Provider, MD  DESCOVY 200-25 MG tablet Take 1 tablet by mouth at bedtime.  08/12/15   Historical Provider, MD  diazepam (VALIUM) 5 MG tablet Take 5 mg by mouth at bedtime. For sleep    Historical Provider, MD  docusate sodium (COLACE) 100 MG capsule Take 100 mg by mouth at bedtime.  05/24/14   Historical Provider, MD  dolutegravir (TIVICAY) 50 MG tablet Take 50 mg by mouth at bedtime.  07/31/15   Historical Provider, MD  esomeprazole (NEXIUM) 40 MG capsule Take 1 capsule (40 mg total) by mouth 2 (two) times daily before a meal. AM Patient taking differently: Take 40 mg by mouth every evening. AM 01/23/15   Tracie Harrier, MD  ferrous sulfate 325 (65 FE) MG tablet Take 325 mg by mouth 2 (two) times daily. 01/10/15 01/10/16  Historical Provider, MD  folic acid (FOLVITE) 1 MG tablet Take 1 mg by mouth every evening.     Historical Provider, MD  HYDROcodone-acetaminophen (NORCO/VICODIN) 5-325 MG tablet Take 1 tablet by mouth See admin instructions. Take 1 tablet by mouth at bedtime if needed for pain, and Take 1 tablet by mouth 30 min before physical therapy. 10/18/15   Historical Provider, MD  losartan (COZAAR) 100 MG tablet Take 100 mg by mouth every morning.  10/31/15   Historical Provider, MD  nystatin (MYCOSTATIN) 100000 UNIT/ML suspension Take 5 mLs (500,000 Units total) by mouth 4 (four) times daily. Patient taking differently: Take 5  mLs by mouth 4 (four) times daily as needed. For throat pain. 01/23/15   Vishwanath Hande, MD  omeprazole (PRILOSEC) 40 MG capsule Take 40 mg by mouth every morning.  11/06/15   Historical Provider, MD  sertraline (ZOLOFT) 50 MG tablet Take 50 mg by mouth every evening.     Historical Provider, MD    Allergies  Allergen Reactions  . Buchu-Cornsilk-Ch Grass-Hydran Other (See Comments)    Gout.  . Colchicine Diarrhea    "Have diarrhea when taken for long periods of time"  . Nifedipine Other (See Comments)    "Unknown"  . Oxycodone-Acetaminophen Other (See Comments)    GI upset.  . Tramadol Other (See Comments)    "unknown"    Family History  Problem Relation Age of Onset  . Hypertension Mother   . Cancer Father   . Heart disease Sister   .  Diabetes Brother   . Anesthesia problems Neg Hx     Social History Social History  Substance Use Topics  . Smoking status: Former Research scientist (life sciences)  . Smokeless tobacco: Former Systems developer    Quit date: 03/13/1972  . Alcohol use No    Review of Systems Constitutional: Negative for fever. Cardiovascular: Chest pain tonight which has resolved. Respiratory: Negative for shortness of breath. Gastrointestinal: Negative for abdominal pain Genitourinary: Negative for dysuria. Musculoskeletal: Negative for back pain. Neurological: Negative for headache 10-point ROS otherwise negative.  ____________________________________________   PHYSICAL EXAM:  VITAL SIGNS: ED Triage Vitals [01/07/16 1850]  Enc Vitals Group     BP (!) 141/74     Pulse Rate 64     Resp 18     Temp 98.2 F (36.8 C)     Temp Source Oral     SpO2 99 %     Weight 218 lb (98.9 kg)     Height 5\' 10"  (1.778 m)     Head Circumference      Peak Flow      Pain Score 0     Pain Loc      Pain Edu?      Excl. in [place]?     Constitutional: Alert and oriented. Well appearing and in no distress. Eyes: Normal exam ENT   Head: Normocephalic and atraumatic.   Mouth/Throat: Mucous  membranes are moist. Cardiovascular: Normal rate, regular rhythm. No murmur Respiratory: Normal respiratory effort without tachypnea nor retractions. Breath sounds are clear  Gastrointestinal: Soft and nontender. No distention.   Musculoskeletal: Nontender with normal range of motion in all extremities. Neurologic:  Normal speech and language. No gross focal neurologic deficits are appreciated. Skin:  Skin is warm, dry and intact.  Psychiatric: Mood and affect are normal. Speech and behavior are normal.   ____________________________________________    EKG  EKG reviewed and interpreted by myself shows sinus bradycardia at 56 bpm, narrow QRS, normal axis, normal intervals, patient does have lateral T-wave inversion which are largely unchanged from 11/09/15. No ST elevation.  ____________________________________________    RADIOLOGY  Chest x-ray negative  ____________________________________________   INITIAL IMPRESSION / ASSESSMENT AND PLAN / ED COURSE  Pertinent labs & imaging results that were available during my care of the patient were reviewed by me and considered in my medical decision making (see chart for details).  Patient presents emergency department concerns of fluid accumulation and mild chest pain tonight which has resolved. Patient's chest x-ray is clear, no signs of pulmonary edema. Patient does have 2+ lower extremity edema bilaterally. We will dose IV Lasix in the emergency department, patient does state moderate pain in his lower extremities which she states is common with his fluid accumulation. We'll dose pain medication. Patient's labs including troponin are negative. We will repeat a troponin at 3 hours. Patient has a cardiologist with whom he has close contact with at [facility].  Repeat troponin pending. Patient care signed out to Dr. Dahlia Client.  ____________________________________________   FINAL CLINICAL IMPRESSION(S) / ED DIAGNOSES  Peripheral edema     Harvest Dark, MD 01/08/16 1440

## 2016-01-07 NOTE — ED Triage Notes (Addendum)
"  I'm retaining a lot of fluid and having difficulty breathing"  Onset of symptoms Sunday.  States had a heart attack about one month ago.  Also c/o intermittent chest pain.  Patient goes to cardiac boot camp and noticed a 2 pound weight gain since yesterday.

## 2016-01-07 NOTE — Progress Notes (Signed)
Incomplete Session Note  Patient Details  Name: Jared Walker MRN: [id number] Date of Birth: 1951/01/06 Referring Provider:   Flowsheet Row Cardiac Rehab from 12/11/2015 in [facility] Cardiac and Pulmonary Rehab  Referring Provider  [name] did not complete his rehab session.  Came to class with weight gain over 5 pounds.  Was sent home to call MD.

## 2016-01-08 LAB — TROPONIN I

## 2016-01-08 NOTE — ED Provider Notes (Signed)
-----------------------------------------   1:18 AM on 01/08/2016 -----------------------------------------   Blood pressure 134/84, pulse 60, temperature 98.2 F (36.8 C), temperature source Oral, resp. rate 14, height 5\' 10"  (1.778 m), weight 218 lb (98.9 kg), SpO2 96 %.  Assuming care from Dr. Kerman Passey.  In short, Jared Walker is a 65 y.o. male with a chief complaint of Chest Pain and Shortness of Breath .  Refer to the original H&P for additional details.  The current plan of care is to follow up the results of the troponin.  The patient's troponin is unremarkable he will be discharged home to follow-up with his cardiologist.    Loney Hering, MD 01/08/16 [phone]

## 2016-01-13 ENCOUNTER — Inpatient Hospital Stay: Payer: Medicare Other | Attending: Hematology and Oncology

## 2016-01-15 ENCOUNTER — Encounter: Payer: Self-pay | Admitting: *Deleted

## 2016-01-15 DIAGNOSIS — I214 Non-ST elevation (NSTEMI) myocardial infarction: Secondary | ICD-10-CM

## 2016-01-15 NOTE — Progress Notes (Signed)
Cardiac Individual Treatment Plan  Patient Details  Name: Jared Walker MRN: [id number] Date of Birth: Feb 14, 1951 Referring Provider:   Flowsheet Row Cardiac Rehab from 12/11/2015 in [facility] Cardiac and Pulmonary Rehab  Referring Provider  Stann Mainland      Initial Encounter Date:  Flowsheet Row Cardiac Rehab from 12/11/2015 in [facility] Cardiac and Pulmonary Rehab  Date  12/11/15  Referring Provider  Stann Mainland      Visit Diagnosis: NSTEMI (non-ST elevated myocardial infarction) [facility])  Patient's Home Medications on Admission:  Current Outpatient Prescriptions:  .  allopurinol (ZYLOPRIM) 100 MG tablet, Take 200 mg by mouth daily. *Take along with 300 mg tablet to equal total dose of 500 mg daily.*, Disp: , Rfl:  .  allopurinol (ZYLOPRIM) 300 MG tablet, Take 300 mg by mouth See admin instructions. *Take along with 2 tablets of 100 mg (200 mg) to equal total dose of 500 mg by mouth daily*, Disp: , Rfl:  .  amLODipine (NORVASC) 10 MG tablet, Take 10 mg by mouth., Disp: , Rfl:  .  aspirin EC 81 MG EC tablet, Take 1 tablet (81 mg total) by mouth daily., Disp: 120 tablet, Rfl: 0 .  atenolol (TENORMIN) 100 MG tablet, Take 100 mg by mouth every evening. , Disp: , Rfl:  .  atorvastatin (LIPITOR) 10 MG tablet, Take 80 mg by mouth., Disp: , Rfl:  .  Cholecalciferol (VITAMIN D-1000 MAX ST) 1000 units tablet, Take 1,000 Units by mouth daily., Disp: , Rfl:  .  cloNIDine (CATAPRES) 0.1 MG tablet, Take 0.1 mg by mouth at bedtime. , Disp: , Rfl:  .  Cyanocobalamin (RA VITAMIN B-12 TR) 1000 MCG TBCR, Take 1,000 mcg by mouth daily., Disp: , Rfl:  .  DESCOVY 200-25 MG tablet, Take 1 tablet by mouth at bedtime. , Disp: , Rfl: 3 .  diazepam (VALIUM) 5 MG tablet, Take 5 mg by mouth at bedtime. For sleep, Disp: , Rfl:  .  docusate sodium (COLACE) 100 MG capsule, Take 100 mg by mouth at bedtime. , Disp: , Rfl:  .  dolutegravir (TIVICAY) 50 MG tablet, Take 50 mg by mouth at bedtime. , Disp: , Rfl:  .  esomeprazole  (NEXIUM) 40 MG capsule, Take 1 capsule (40 mg total) by mouth 2 (two) times daily before a meal. AM (Patient taking differently: Take 40 mg by mouth every evening. AM), Disp: 60 capsule, Rfl: 3 .  ferrous sulfate 325 (65 FE) MG tablet, Take 325 mg by mouth 2 (two) times daily., Disp: , Rfl:  .  folic acid (FOLVITE) 1 MG tablet, Take 1 mg by mouth every evening. , Disp: , Rfl:  .  HYDROcodone-acetaminophen (NORCO/VICODIN) 5-325 MG tablet, Take 1 tablet by mouth See admin instructions. Take 1 tablet by mouth at bedtime if needed for pain, and Take 1 tablet by mouth 30 min before physical therapy., Disp: , Rfl: 0 .  losartan (COZAAR) 100 MG tablet, Take 100 mg by mouth every morning. , Disp: , Rfl: 0 .  nystatin (MYCOSTATIN) 100000 UNIT/ML suspension, Take 5 mLs (500,000 Units total) by mouth 4 (four) times daily. (Patient taking differently: Take 5 mLs by mouth 4 (four) times daily as needed. For throat pain.), Disp: 60 mL, Rfl: 0 .  omeprazole (PRILOSEC) 40 MG capsule, Take 40 mg by mouth every morning. , Disp: , Rfl: 0 .  sertraline (ZOLOFT) 50 MG tablet, Take 50 mg by mouth every evening. , Disp: , Rfl:   Past Medical History: Past Medical History:  Diagnosis Date  . Anemia   . Aortic stenosis, mild    mild AS by echo, 08/2011  . Chronic kidney disease    kidney stones  . Coronary artery disease   . Depression   . Full dentures    upper and lower  . Gout   . Heart murmur   . Hep C w/o coma, chronic ([place]) 01/19/2015  . Hepatitis C 09/25/11  . HIV positive ([place]) 09/25/11  . Hypertension   . MI (myocardial infarction) ([place])     Tobacco Use: History  Smoking Status  . Former Smoker  Smokeless Tobacco  . Former Systems developer  . Quit date: 03/13/1972    Labs: Recent Review Flowsheet Data    Labs for ITP Cardiac and Pulmonary Rehab Latest Ref Rng & Units 11/13/2011 11/10/2015   Cholestrol 0 - 200 mg/dL 132 120   LDLCALC 0 - 99 mg/dL 71 69   HDL >40 mg/dL 36(L) 30(L)   Trlycerides <150 mg/dL  124 107       Exercise Target Goals:    Exercise Program Goal: Individual exercise prescription set with THRR, safety & activity barriers. Participant demonstrates ability to understand and report RPE using BORG scale, to self-measure pulse accurately, and to acknowledge the importance of the exercise prescription.  Exercise Prescription Goal: Starting with aerobic activity 30 plus minutes a day, 3 days per week for initial exercise prescription. Provide home exercise prescription and guidelines that participant acknowledges understanding prior to discharge.  Activity Barriers & Risk Stratification:     Activity Barriers & Cardiac Risk Stratification - 12/11/15 1536      Activity Barriers & Cardiac Risk Stratification   Activity Barriers Deconditioning;Back Problems   Cardiac Risk Stratification High      6 Minute Walk:     6 Minute Walk    Row Name 12/11/15 1539         6 Minute Walk   Distance 1174 feet     Walk Time 6 minutes     # of Rest Breaks 0     MPH 2.22     METS 2.84     RPE 14     VO2 Peak 9.95     Symptoms Yes (comment)     Comments chronic back pain 8/10     Resting HR 60 bpm     Resting BP 132/80     Max Ex. HR 88 bpm     Max Ex. BP 132/72        Initial Exercise Prescription:     Initial Exercise Prescription - 12/11/15 1500      Date of Initial Exercise RX and Referring Provider   Date 12/11/15   Referring Provider Stann Mainland     Treadmill   MPH 2  only if it doesnt cause back pain   Grade 0   Minutes 15   METs 2.53     Recumbant Bike   Level 2   RPM 60   Watts 15   Minutes 15   METs 2.5     NuStep   Level 2   Watts 40   Minutes 15     Recumbant Elliptical   Level 2   RPM 60   Minutes 15   METs 2.5     REL-XR   Level 2   Minutes 15   METs 2.5     T5 Nustep   Level 2   Minutes 15   METs 2.5  Biostep-RELP   Level 2   Minutes 15   METs 2.5     Prescription Details   Frequency (times per week) 3    Duration Progress to 45 minutes of aerobic exercise without signs/symptoms of physical distress     Intensity   THRR 40-80% of Max Heartrate 98-137   Ratings of Perceived Exertion 11-13     Progression   Progression Continue to progress workloads to maintain intensity without signs/symptoms of physical distress.     Resistance Training   Training Prescription Yes   Weight 2   Reps 10-15      Perform Capillary Blood Glucose checks as needed.  Exercise Prescription Changes:     Exercise Prescription Changes    Row Name 12/25/15 1500             Exercise Review   Progression Yes         Response to Exercise   Blood Pressure (Admit) 126/70       Blood Pressure (Exercise) 154/78       Blood Pressure (Exit) 130/80       Heart Rate (Admit) 66 bpm       Heart Rate (Exercise) 95 bpm       Heart Rate (Exit) 58 bpm       Rating of Perceived Exertion (Exercise) 11       Symptoms none       Duration Progress to 45 minutes of aerobic exercise without signs/symptoms of physical distress       Intensity THRR unchanged         Progression   Progression Continue to progress workloads to maintain intensity without signs/symptoms of physical distress.       Average METs 2.15         Resistance Training   Training Prescription Yes       Weight 2       Reps 10-15         Interval Training   Interval Training No         REL-XR   Level 2       Minutes 15       METs 2.4         T5 Nustep   Level 2       Minutes 15       METs 1.9          Exercise Comments:     Exercise Comments    Row Name 12/17/15 0831 12/17/15 0954 12/25/15 1535       Exercise Comments First full day of exercise!  Patient was oriented to gym and equipment including functions, settings, policies, and procedures.  Patient's individual exercise prescription and treatment plan were reviewed.  All starting workloads were established based on the results of the 6 minute walk test done at initial  orientation visit.  The plan for exercise progression was also introduced and progression will be customized based on patient's performance and goals. Jared Walker experienced 5/10 anginal symptoms.  Resolved with rest. Talking with Jared Walker, he states that he has had an increased use of his ntg over the past week. Swelling in Right leg greater than leg leg swelling. He was advised to call Dr Dennie Maizes office today to see MD as soon as possibel ,today or this week. He was also advised if symptoms increase he needs to gfo to ED if not able to see MD.  Jared Walker has been doing well with exercise.  He has not  had any more recurring symptoms during exercise.  He has not tried to contact his doctor yet.  We will continue to monitor for progression.        Discharge Exercise Prescription (Final Exercise Prescription Changes):     Exercise Prescription Changes - 12/25/15 1500      Exercise Review   Progression Yes     Response to Exercise   Blood Pressure (Admit) 126/70   Blood Pressure (Exercise) 154/78   Blood Pressure (Exit) 130/80   Heart Rate (Admit) 66 bpm   Heart Rate (Exercise) 95 bpm   Heart Rate (Exit) 58 bpm   Rating of Perceived Exertion (Exercise) 11   Symptoms none   Duration Progress to 45 minutes of aerobic exercise without signs/symptoms of physical distress   Intensity THRR unchanged     Progression   Progression Continue to progress workloads to maintain intensity without signs/symptoms of physical distress.   Average METs 2.15     Resistance Training   Training Prescription Yes   Weight 2   Reps 10-15     Interval Training   Interval Training No     REL-XR   Level 2   Minutes 15   METs 2.4     T5 Nustep   Level 2   Minutes 15   METs 1.9      Nutrition:  Target Goals: Understanding of nutrition guidelines, daily intake of sodium 1500mg , cholesterol 200mg , calories 30% from fat and 7% or less from saturated fats, daily to have 5 or more servings of fruits and  vegetables.  Biometrics:     Pre Biometrics - 12/11/15 1534      Pre Biometrics   Height 5' 10.3" (1.786 m)   Weight 210 lb 14.4 oz (95.7 kg)   Waist Circumference 43.75 inches   Hip Circumference 43 inches   Waist to Hip Ratio 1.02 %   BMI (Calculated) 30.1   Single Leg Stand 23 seconds       Nutrition Therapy Plan and Nutrition Goals:     Nutrition Therapy & Goals - 12/27/15 1144      Nutrition Therapy   Diet Instructed on heart healthy dietary guidelines including DASH diet principles.   Drug/Food Interactions Statins/Certain Fruits   Protein (specify units) 7   Fiber 30 grams   Whole Grain Foods 3 servings   Saturated Fats 12 max. grams   Fruits and Vegetables 5 servings/day   Sodium 2000 grams     Personal Nutrition Goals   Personal Goal #1 Continue to decrease sugar in coffee and sweet desserts (honey buns). Try Splenda in coffee.   Personal Goal #2 Read labels for saturated fat, trans fat and sodium.   Personal Goal #3 Consider making smoothie with low fat milk, yogurt and fruit verses buying mix.     Intervention Plan   Intervention Prescribe, educate and counsel regarding individualized specific dietary modifications aiming towards targeted core components such as weight, hypertension, lipid management, diabetes, heart failure and other comorbidities.;Nutrition handout(s) given to patient.   Expected Outcomes Short Term Goal: Understand basic principles of dietary content, such as calories, fat, sodium, cholesterol and nutrients.      Nutrition Discharge: Rate Your Plate Scores:   Nutrition Goals Re-Evaluation:   Psychosocial: Target Goals: Acknowledge presence or absence of depression, maximize coping skills, provide positive support system. Participant is able to verbalize types and ability to use techniques and skills needed for reducing stress and depression.  Initial Review & Psychosocial  Screening:     Initial Psych Review & Screening - 12/11/15  1539      Initial Review   Current issues with Current Stress Concerns     Family Dynamics   Good Support System? Yes     Screening Interventions   Interventions Encouraged to exercise;Program counselor consult      Quality of Life Scores:     Quality of Life - 12/13/15 1213      Quality of Life Scores   Health/Function Pre 14.2 %   Socioeconomic Pre 24.08 %   Psych/Spiritual Pre 20.57 %   Family Pre 26.4 %   GLOBAL Pre 19.2 %      PHQ-9: Recent Review Flowsheet Data    Depression screen [facility] 2/9 12/11/2015   Decreased Interest 3   Down, Depressed, Hopeless 3   PHQ - 2 Score 6   Altered sleeping 1   Tired, decreased energy 3   Change in appetite 0   Feeling bad or failure about yourself  2   Trouble concentrating 1   Moving slowly or fidgety/restless 1   Suicidal thoughts 0   PHQ-9 Score 14   Difficult doing work/chores Somewhat difficult      Psychosocial Evaluation and Intervention:   Psychosocial Re-Evaluation:     Psychosocial Re-Evaluation    Row Name 12/24/15 [phone]             Psychosocial Re-Evaluation   Comments Jared Walker ststed that he is feeling much better since starting the progra. His mmod was low and has improved since starting profgram.  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  He meets the seniors as they arrive and takes time to talk and enjoy their company.          Vocational Rehabilitation: Provide vocational rehab assistance to qualifying candidates.   Vocational Rehab Evaluation & Intervention:     Vocational Rehab - 12/11/15 1536      Initial Vocational Rehab Evaluation & Intervention   Assessment shows need for Vocational Rehabilitation No      Education: Education Goals: Education classes will be provided on a weekly basis, covering required topics. Participant will state understanding/return demonstration of topics presented.  Learning Barriers/Preferences:     Learning  Barriers/Preferences - 12/11/15 1536      Learning Barriers/Preferences   Learning Barriers None   Learning Preferences Computer/Internet;Individual Instruction;Group Instruction      Education Topics: General Nutrition Guidelines/Fats and Fiber: -Group instruction provided by verbal, written material, models and posters to present the general guidelines for heart healthy nutrition. Gives an explanation and review of dietary fats and fiber. Flowsheet Row Cardiac Rehab from 12/31/2015 in [facility] Cardiac and Pulmonary Rehab  Date  12/24/15  Educator  CR  Instruction Review Code  2- meets goals/outcomes      Controlling Sodium/Reading Food Labels: -Group verbal and written material supporting the discussion of sodium use in heart healthy nutrition. Review and explanation with models, verbal and written materials for utilization of the food label. Flowsheet Row Cardiac Rehab from 12/31/2015 in [facility] Cardiac and Pulmonary Rehab  Date  12/31/15  Educator  Erlene Quan  Instruction Review Code  2- meets goals/outcomes      Exercise Physiology & Risk Factors: - Group verbal and written instruction with models to review the exercise physiology of the cardiovascular system and associated critical values. Details cardiovascular disease risk factors and the goals associated with each risk factor.   Aerobic  Exercise & Resistance Training: - Gives group verbal and written discussion on the health impact of inactivity. On the components of aerobic and resistive training programs and the benefits of this training and how to safely progress through these programs.   Flexibility, Balance, General Exercise Guidelines: - Provides group verbal and written instruction on the benefits of flexibility and balance training programs. Provides general exercise guidelines with specific guidelines to those with heart or lung disease. Demonstration and skill practice provided.   Stress Management: - Provides group  verbal and written instruction about the health risks of elevated stress, cause of high stress, and healthy ways to reduce stress.   Depression: - Provides group verbal and written instruction on the correlation between heart/lung disease and depressed mood, treatment options, and the stigmas associated with seeking treatment.   Anatomy & Physiology of the Heart: - Group verbal and written instruction and models provide basic cardiac anatomy and physiology, with the coronary electrical and arterial systems. Review of: AMI, Angina, Valve disease, Heart Failure, Cardiac Arrhythmia, Pacemakers, and the ICD.   Cardiac Procedures: - Group verbal and written instruction and models to describe the testing methods done to diagnose heart disease. Reviews the outcomes of the test results. Describes the treatment choices: Medical Management, Angioplasty, or Coronary Bypass Surgery.   Cardiac Medications: - Group verbal and written instruction to review commonly prescribed medications for heart disease. Reviews the medication, class of the drug, and side effects. Includes the steps to properly store meds and maintain the prescription regimen. Flowsheet Row Cardiac Rehab from 12/31/2015 in [facility] Cardiac and Pulmonary Rehab  Date  12/17/15 [Second Part]  Educator  SB  Instruction Review Code  2- meets goals/outcomes      Go Sex-Intimacy & Heart Disease, Get SMART - Goal Setting: - Group verbal and written instruction through game format to discuss heart disease and the return to sexual intimacy. Provides group verbal and written material to discuss and apply goal setting through the application of the S.M.A.R.T. Method.   Other Matters of the Heart: - Provides group verbal, written materials and models to describe Heart Failure, Angina, Valve Disease, and Diabetes in the realm of heart disease. Includes description of the disease process and treatment options available to the cardiac  patient.   Exercise & Equipment Safety: - Individual verbal instruction and demonstration of equipment use and safety with use of the equipment. Flowsheet Row Cardiac Rehab from 12/31/2015 in [facility] Cardiac and Pulmonary Rehab  Date  12/11/15  Educator  C. enterkinRN  Instruction Review Code  1- partially meets, needs review/practice      Infection Prevention: - Provides verbal and written material to individual with discussion of infection control including proper hand washing and proper equipment cleaning during exercise session. Flowsheet Row Cardiac Rehab from 12/31/2015 in [facility] Cardiac and Pulmonary Rehab  Date  12/11/15  Educator  C. EnterkinRN  Instruction Review Code  2- meets goals/outcomes      Falls Prevention: - Provides verbal and written material to individual with discussion of falls prevention and safety. Flowsheet Row Cardiac Rehab from 12/31/2015 in [facility] Cardiac and Pulmonary Rehab  Date  12/11/15  Educator  C. [place]  Instruction Review Code  2- meets goals/outcomes      Diabetes: - Individual verbal and written instruction to review signs/symptoms of diabetes, desired ranges of glucose level fasting, after meals and with exercise. Advice that pre and post exercise glucose checks will be done for 3 sessions at entry of program.  Knowledge Questionnaire Score:     Knowledge Questionnaire Score - 12/12/15 1521      Knowledge Questionnaire Score   Pre Score 21      Core Components/Risk Factors/Patient Goals at Admission:     Personal Goals and Risk Factors at Admission - 12/11/15 1539      Core Components/Risk Factors/Patient Goals on Admission   Sedentary Yes   Intervention Provide advice, education, support and counseling about physical activity/exercise needs.;Develop an individualized exercise prescription for aerobic and resistive training based on initial evaluation findings, risk stratification, comorbidities and participant's personal goals.    Expected Outcomes Achievement of increased cardiorespiratory fitness and enhanced flexibility, muscular endurance and strength shown through measurements of functional capacity and personal statement of participant.   Hypertension Yes   Intervention Provide education on lifestyle modifcations including regular physical activity/exercise, weight management, moderate sodium restriction and increased consumption of fresh fruit, vegetables, and low fat dairy, alcohol moderation, and smoking cessation.;Monitor prescription use compliance.   Expected Outcomes Short Term: Continued assessment and intervention until BP is < 140/8mm HG in hypertensive participants. < 130/58mm HG in hypertensive participants with diabetes, heart failure or chronic kidney disease.;Long Term: Maintenance of blood pressure at goal levels.   Stress Yes   Intervention Offer individual and/or small group education and counseling on adjustment to heart disease, stress management and health-related lifestyle change. Teach and support self-help strategies.;Refer participants experiencing significant psychosocial distress to appropriate mental health specialists for further evaluation and treatment. When possible, include family members and significant others in education/counseling sessions.   Expected Outcomes Short Term: Participant demonstrates changes in health-related behavior, relaxation and other stress management skills, ability to obtain effective social support, and compliance with psychotropic medications if prescribed.;Long Term: Emotional wellbeing is indicated by absence of clinically significant psychosocial distress or social isolation.      Core Components/Risk Factors/Patient Goals Review:      Goals and Risk Factor Review    Row Name 12/24/15 0955             Core Components/Risk Factors/Patient Goals Review   Personal Goals Review Weight Management/Obesity;Hypertension;Stress       Review Spoke with  Jared Walker today about risk factors listed above. HE volunteered that he is eating less honey buns. His goal is to stop eating the honey buns and other sweets in order to aid his weight loss goal. He had appointment this Friday and states is looking forward to talking with the RD.  He is eating less red meat, decreased the saturated fats and eating lots of vegetables. He has substituted yoguart for ice cream. Jared Walker continues to be compiant with his medications and has noticed an improvement with his BP since fgetting out of the hospital. He rarely uses salt.       Expected Outcomes Continues to work on decreasing the amount of sweets he is eating every day to help decrease his weight. Continued management of his hypertension.          Core Components/Risk Factors/Patient Goals at Discharge (Final Review):      Goals and Risk Factor Review - 12/24/15 0955      Core Components/Risk Factors/Patient Goals Review   Personal Goals Review Weight Management/Obesity;Hypertension;Stress   Review Spoke with Jared Walker today about risk factors listed above. HE volunteered that he is eating less honey buns. His goal is to stop eating the honey buns and other sweets in order to aid his weight loss goal. He had appointment this Friday and states  is looking forward to talking with the RD.  He is eating less red meat, decreased the saturated fats and eating lots of vegetables. He has substituted yoguart for ice cream. Jared Walker continues to be compiant with his medications and has noticed an improvement with his BP since fgetting out of the hospital. He rarely uses salt.   Expected Outcomes Continues to work on decreasing the amount of sweets he is eating every day to help decrease his weight. Continued management of his hypertension.      ITP Comments:     ITP Comments    Row Name 12/17/15 0831 12/17/15 0957 12/18/15 0914 12/19/15 0953 12/31/15 1035   ITP Comments Today was Jared Walker's first full day.  We will  continue to follow ITP. Jared Walker experienced 5/10 anginal symptoms.  Resolved with rest. Talking with Jared Walker, he states that he has had an increased use of his ntg over the past week. Swelling in Right leg greater than leg leg swelling. He was advised to call Dr Dennie Maizes office today to see MD as soon as possibel ,today or this week. He was also advised if symptoms increase he needs to gfo to ED if not able to see MD 30 day review. Continue with ITP Jared Walker has not been able to talk with doctor Jared Walker his chest pain.  He was constantly put on hold and not able to talk with anyone.  He has not has any pain since Tuesday. Jared Walker had a 6 pound weight increase today.  He reported not taking his Lasix since last week due to it keeping him up at night. He stated he had been taking his daily prescribed dose of Lasix at 9 p.m. or 10 p.m.  nightly.   Re-educated patient about better times to take Lasix 20 mg once a day as prescribed.  He plans to resume taking Lasix daily before 5 p.m. every day.     [place] Name 01/02/16 1022 01/15/16 0735         ITP Comments Jared Walker's weight was down today 4 lbs.  He has started taking his lasix in the morning now. 30 day review. Continue with ITP unless changes noted by Medical Director at signature of review.         Comments:

## 2016-01-20 ENCOUNTER — Inpatient Hospital Stay: Payer: Medicare Other

## 2016-01-20 ENCOUNTER — Inpatient Hospital Stay: Payer: Medicare Other | Admitting: Hematology and Oncology

## 2016-01-21 ENCOUNTER — Encounter: Payer: Medicare Other | Admitting: *Deleted

## 2016-01-21 DIAGNOSIS — I214 Non-ST elevation (NSTEMI) myocardial infarction: Secondary | ICD-10-CM | POA: Diagnosis not present

## 2016-01-21 NOTE — Progress Notes (Signed)
Daily Session Note  Patient Details  Name: Jared Walker MRN: [id number] Date of Birth: 03/28/1951 Referring Provider:   Flowsheet Row Cardiac Rehab from 12/11/2015 in [facility] Cardiac and Pulmonary Rehab  Referring Provider  Stann Mainland      Encounter Date: 01/21/2016  Check In:     Session Check In - 01/21/16 0837      Check-In   Location [facility]-Cardiac & Pulmonary Rehab   Staff Present Nyoka Cowden, RN, BSN, Willette Pa, MA, ACSM RCEP, Exercise Physiologist;Susanne Bice, RN, BSN, CCRP   Supervising physician immediately available to respond to emergencies See telemetry face sheet for immediately available ER MD   Medication changes reported     No   Fall or balance concerns reported    No   Warm-up and Cool-down Performed on first and last piece of equipment   Resistance Training Performed Yes   VAD Patient? No     Pain Assessment   Currently in Pain? No/denies   Multiple Pain Sites No         Goals Met:  Independence with exercise equipment Exercise tolerated well No report of cardiac concerns or symptoms Strength training completed today  Goals Unmet:  Not Applicable  Comments: Pt able to follow exercise prescription today without complaint.  Will continue to monitor for progression. Angle returned today.  His weight is down to 212 today.  Exercised without complaints.   Dr. Emily Filbert is Medical Director for [place] and [facility].

## 2016-01-23 ENCOUNTER — Encounter: Payer: Medicare Other | Admitting: *Deleted

## 2016-01-23 DIAGNOSIS — I214 Non-ST elevation (NSTEMI) myocardial infarction: Secondary | ICD-10-CM | POA: Diagnosis not present

## 2016-01-23 NOTE — Progress Notes (Signed)
Daily Session Note  Patient Details  Name: [name] MRN: [id number] Date of Birth: 11/20/1950 Referring Provider:   Flowsheet Row Cardiac Rehab from 12/11/2015 in [facility] Cardiac and Pulmonary Rehab  Referring Provider  Stann Mainland      Encounter Date: 01/23/2016  Check In:     Session Check In - 01/23/16 1033      Check-In   Location [facility]-Cardiac & Pulmonary Rehab   Staff Present Nada Maclachlan, BA, ACSM CEP, Exercise Physiologist;Susanne Bice, RN, BSN, CCRP;Isadore Palecek Luan Pulling, MA, ACSM RCEP, Exercise Physiologist   Supervising physician immediately available to respond to emergencies See telemetry face sheet for immediately available ER MD   Medication changes reported     No   Fall or balance concerns reported    No   Warm-up and Cool-down Performed on first and last piece of equipment   Resistance Training Performed Yes   VAD Patient? No     Pain Assessment   Currently in Pain? No/denies   Multiple Pain Sites No           Exercise Prescription Changes - 01/22/16 1500      Exercise Review   Progression Yes     Response to Exercise   Blood Pressure (Admit) 132/64   Blood Pressure (Exercise) 144/74   Blood Pressure (Exit) 106/60   Heart Rate (Admit) 68 bpm   Heart Rate (Exercise) 85 bpm   Heart Rate (Exit) 61 bpm   Rating of Perceived Exertion (Exercise) 13   Symptoms none   Duration Progress to 45 minutes of aerobic exercise without signs/symptoms of physical distress   Intensity THRR unchanged     Progression   Progression Continue to progress workloads to maintain intensity without signs/symptoms of physical distress.   Average METs 2.6     Resistance Training   Training Prescription Yes   Weight 3 lbs   Reps 10-15     Interval Training   Interval Training No     NuStep   Level 5   Watts --  2.8 METs   Minutes 15     REL-XR   Level 2   Minutes 15   METs 2.9     T5 Nustep   Level 2   Minutes 15   METs 2.1      Goals Met:  Independence  with exercise equipment Exercise tolerated well No report of cardiac concerns or symptoms Strength training completed today  Goals Unmet:  Not Applicable  Comments: Pt able to follow exercise prescription today without complaint.  Will continue to monitor for progression.    Dr. Emily Filbert is Medical Director for [place] and [facility].

## 2016-01-28 ENCOUNTER — Encounter: Payer: Medicare Other | Admitting: *Deleted

## 2016-01-28 DIAGNOSIS — I214 Non-ST elevation (NSTEMI) myocardial infarction: Secondary | ICD-10-CM | POA: Diagnosis not present

## 2016-01-28 NOTE — Progress Notes (Signed)
Daily Session Note  Patient Details  Name: Jared Walker MRN: [id number] Date of Birth: 04/19/1951 Referring Provider:   Flowsheet Row Cardiac Rehab from 12/11/2015 in [facility] Cardiac and Pulmonary Rehab  Referring Provider  Stann Mainland      Encounter Date: 01/28/2016  Check In:     Session Check In - 01/28/16 3361      Check-In   Location [facility]-Cardiac & Pulmonary Rehab   Staff Present Alberteen Sam, MA, ACSM RCEP, Exercise Physiologist;Susanne Bice, RN, BSN, CCRP;Carroll Enterkin, RN, BSN   Supervising physician immediately available to respond to emergencies See telemetry face sheet for immediately available ER MD   Medication changes reported     No   Fall or balance concerns reported    No   Warm-up and Cool-down Performed on first and last piece of equipment   Resistance Training Performed Yes   VAD Patient? No     Pain Assessment   Currently in Pain? No/denies   Multiple Pain Sites No         Goals Met:  Independence with exercise equipment Exercise tolerated well No report of cardiac concerns or symptoms Strength training completed today  Goals Unmet:  weight up again today, ate spaghetti and meatballs yesterday  Comments: Pt able to follow exercise prescription today without complaint.  Will continue to monitor for progression.  Reviewed home exercise with pt today.  Pt plans to walk at home and go to [facility] for exercise.  Reviewed THR, pulse, RPE, sign and symptoms, NTG use, and when to call 911 or MD.  Also discussed weather considerations and indoor options.  Pt voiced understanding.    Dr. Emily Filbert is Medical Director for [place] and [facility].

## 2016-02-04 ENCOUNTER — Encounter: Payer: Medicare Other | Attending: Internal Medicine

## 2016-02-04 DIAGNOSIS — I214 Non-ST elevation (NSTEMI) myocardial infarction: Secondary | ICD-10-CM | POA: Insufficient documentation

## 2016-02-11 ENCOUNTER — Encounter: Payer: Medicare Other | Admitting: *Deleted

## 2016-02-11 DIAGNOSIS — I214 Non-ST elevation (NSTEMI) myocardial infarction: Secondary | ICD-10-CM | POA: Diagnosis present

## 2016-02-11 NOTE — Progress Notes (Signed)
Daily Session Note  Patient Details  Name: Jared Walker MRN: [id number] Date of Birth: 1950-06-29 Referring Provider:   Flowsheet Row Cardiac Rehab from 12/11/2015 in [facility] Cardiac and Pulmonary Rehab  Referring Provider  Stann Mainland      Encounter Date: 02/11/2016  Check In:     Session Check In - 02/11/16 1033      Check-In   Location [facility]-Cardiac & Pulmonary Rehab   Staff Present Heath Lark, RN, BSN, CCRP;Jessica [place], MA, ACSM RCEP, Exercise Physiologist;Laureen Janell Quiet, RRT, Respiratory Therapist   Supervising physician immediately available to respond to emergencies See telemetry face sheet for immediately available ER MD   Medication changes reported     Yes   Comments adjusting medications for weight gain   Fall or balance concerns reported    No   Warm-up and Cool-down Performed on first and last piece of equipment   Resistance Training Performed Yes   VAD Patient? No     Pain Assessment   Currently in Pain? No/denies   Multiple Pain Sites No         Goals Met:  Independence with exercise equipment Exercise tolerated well No report of cardiac concerns or symptoms Strength training completed today  Goals Unmet:  Not Applicable  Comments: Pt able to follow exercise prescription today without complaint.  Will continue to monitor for progression.    Dr. Emily Filbert is Medical Director for [place] and [facility].

## 2016-02-12 ENCOUNTER — Encounter: Payer: Self-pay | Admitting: *Deleted

## 2016-02-12 DIAGNOSIS — I214 Non-ST elevation (NSTEMI) myocardial infarction: Secondary | ICD-10-CM

## 2016-02-12 NOTE — Progress Notes (Signed)
Cardiac Individual Treatment Plan  Patient Details  Name: Jared Walker MRN: [id number] Date of Birth: Feb 14, 1951 Referring Provider:   Flowsheet Row Cardiac Rehab from 12/11/2015 in [facility] Cardiac and Pulmonary Rehab  Referring Provider  Stann Mainland      Initial Encounter Date:  Flowsheet Row Cardiac Rehab from 12/11/2015 in [facility] Cardiac and Pulmonary Rehab  Date  12/11/15  Referring Provider  Stann Mainland      Visit Diagnosis: NSTEMI (non-ST elevated myocardial infarction) [facility])  Patient's Home Medications on Admission:  Current Outpatient Prescriptions:  .  allopurinol (ZYLOPRIM) 100 MG tablet, Take 200 mg by mouth daily. *Take along with 300 mg tablet to equal total dose of 500 mg daily.*, Disp: , Rfl:  .  allopurinol (ZYLOPRIM) 300 MG tablet, Take 300 mg by mouth See admin instructions. *Take along with 2 tablets of 100 mg (200 mg) to equal total dose of 500 mg by mouth daily*, Disp: , Rfl:  .  amLODipine (NORVASC) 10 MG tablet, Take 10 mg by mouth., Disp: , Rfl:  .  aspirin EC 81 MG EC tablet, Take 1 tablet (81 mg total) by mouth daily., Disp: 120 tablet, Rfl: 0 .  atenolol (TENORMIN) 100 MG tablet, Take 100 mg by mouth every evening. , Disp: , Rfl:  .  atorvastatin (LIPITOR) 10 MG tablet, Take 80 mg by mouth., Disp: , Rfl:  .  Cholecalciferol (VITAMIN D-1000 MAX ST) 1000 units tablet, Take 1,000 Units by mouth daily., Disp: , Rfl:  .  cloNIDine (CATAPRES) 0.1 MG tablet, Take 0.1 mg by mouth at bedtime. , Disp: , Rfl:  .  Cyanocobalamin (RA VITAMIN B-12 TR) 1000 MCG TBCR, Take 1,000 mcg by mouth daily., Disp: , Rfl:  .  DESCOVY 200-25 MG tablet, Take 1 tablet by mouth at bedtime. , Disp: , Rfl: 3 .  diazepam (VALIUM) 5 MG tablet, Take 5 mg by mouth at bedtime. For sleep, Disp: , Rfl:  .  docusate sodium (COLACE) 100 MG capsule, Take 100 mg by mouth at bedtime. , Disp: , Rfl:  .  dolutegravir (TIVICAY) 50 MG tablet, Take 50 mg by mouth at bedtime. , Disp: , Rfl:  .  esomeprazole  (NEXIUM) 40 MG capsule, Take 1 capsule (40 mg total) by mouth 2 (two) times daily before a meal. AM (Patient taking differently: Take 40 mg by mouth every evening. AM), Disp: 60 capsule, Rfl: 3 .  ferrous sulfate 325 (65 FE) MG tablet, Take 325 mg by mouth 2 (two) times daily., Disp: , Rfl:  .  folic acid (FOLVITE) 1 MG tablet, Take 1 mg by mouth every evening. , Disp: , Rfl:  .  HYDROcodone-acetaminophen (NORCO/VICODIN) 5-325 MG tablet, Take 1 tablet by mouth See admin instructions. Take 1 tablet by mouth at bedtime if needed for pain, and Take 1 tablet by mouth 30 min before physical therapy., Disp: , Rfl: 0 .  losartan (COZAAR) 100 MG tablet, Take 100 mg by mouth every morning. , Disp: , Rfl: 0 .  nystatin (MYCOSTATIN) 100000 UNIT/ML suspension, Take 5 mLs (500,000 Units total) by mouth 4 (four) times daily. (Patient taking differently: Take 5 mLs by mouth 4 (four) times daily as needed. For throat pain.), Disp: 60 mL, Rfl: 0 .  omeprazole (PRILOSEC) 40 MG capsule, Take 40 mg by mouth every morning. , Disp: , Rfl: 0 .  sertraline (ZOLOFT) 50 MG tablet, Take 50 mg by mouth every evening. , Disp: , Rfl:   Past Medical History: Past Medical History:  Diagnosis Date  . Anemia   . Aortic stenosis, mild    mild AS by echo, 08/2011  . Chronic kidney disease    kidney stones  . Coronary artery disease   . Depression   . Full dentures    upper and lower  . Gout   . Heart murmur   . Hep C w/o coma, chronic ([place]) 01/19/2015  . Hepatitis C 09/25/11  . HIV positive ([place]) 09/25/11  . Hypertension   . MI (myocardial infarction) ([place])     Tobacco Use: History  Smoking Status  . Former Smoker  Smokeless Tobacco  . Former Systems developer  . Quit date: 03/13/1972    Labs: Recent Review Flowsheet Data    Labs for ITP Cardiac and Pulmonary Rehab Latest Ref Rng & Units 11/13/2011 11/10/2015   Cholestrol 0 - 200 mg/dL 132 120   LDLCALC 0 - 99 mg/dL 71 69   HDL >40 mg/dL 36(L) 30(L)   Trlycerides <150 mg/dL  124 107       Exercise Target Goals:    Exercise Program Goal: Individual exercise prescription set with THRR, safety & activity barriers. Participant demonstrates ability to understand and report RPE using BORG scale, to self-measure pulse accurately, and to acknowledge the importance of the exercise prescription.  Exercise Prescription Goal: Starting with aerobic activity 30 plus minutes a day, 3 days per week for initial exercise prescription. Provide home exercise prescription and guidelines that participant acknowledges understanding prior to discharge.  Activity Barriers & Risk Stratification:     Activity Barriers & Cardiac Risk Stratification - 12/11/15 1536      Activity Barriers & Cardiac Risk Stratification   Activity Barriers Deconditioning;Back Problems   Cardiac Risk Stratification High      6 Minute Walk:     6 Minute Walk    Row Name 12/11/15 1539         6 Minute Walk   Distance 1174 feet     Walk Time 6 minutes     # of Rest Breaks 0     MPH 2.22     METS 2.84     RPE 14     VO2 Peak 9.95     Symptoms Yes (comment)     Comments chronic back pain 8/10     Resting HR 60 bpm     Resting BP 132/80     Max Ex. HR 88 bpm     Max Ex. BP 132/72        Initial Exercise Prescription:     Initial Exercise Prescription - 12/11/15 1500      Date of Initial Exercise RX and Referring Provider   Date 12/11/15   Referring Provider Stann Mainland     Treadmill   MPH 2  only if it doesnt cause back pain   Grade 0   Minutes 15   METs 2.53     Recumbant Bike   Level 2   RPM 60   Watts 15   Minutes 15   METs 2.5     NuStep   Level 2   Watts 40   Minutes 15     Recumbant Elliptical   Level 2   RPM 60   Minutes 15   METs 2.5     REL-XR   Level 2   Minutes 15   METs 2.5     T5 Nustep   Level 2   Minutes 15   METs 2.5  Biostep-RELP   Level 2   Minutes 15   METs 2.5     Prescription Details   Frequency (times per week) 3    Duration Progress to 45 minutes of aerobic exercise without signs/symptoms of physical distress     Intensity   THRR 40-80% of Max Heartrate 98-137   Ratings of Perceived Exertion 11-13     Progression   Progression Continue to progress workloads to maintain intensity without signs/symptoms of physical distress.     Resistance Training   Training Prescription Yes   Weight 2   Reps 10-15      Perform Capillary Blood Glucose checks as needed.  Exercise Prescription Changes:     Exercise Prescription Changes    Row Name 12/25/15 1500 01/22/16 1500 01/28/16 1000 02/05/16 1400       Exercise Review   Progression Yes Yes Yes Yes      Response to Exercise   Blood Pressure (Admit) 126/70 132/64  - 138/72    Blood Pressure (Exercise) 154/78 144/74  - 124/84    Blood Pressure (Exit) 130/80 106/60  - 130/80    Heart Rate (Admit) 66 bpm 68 bpm  - 73 bpm    Heart Rate (Exercise) 95 bpm 85 bpm  - 85 bpm    Heart Rate (Exit) 58 bpm 61 bpm  - 60 bpm    Rating of Perceived Exertion (Exercise) 11 13  - 13    Symptoms none none none none    Comments  -  - Home Exercise Guidelines given 01/28/16 Home Exercise Guidelines given 01/28/16    Duration Progress to 45 minutes of aerobic exercise without signs/symptoms of physical distress Progress to 45 minutes of aerobic exercise without signs/symptoms of physical distress Progress to 45 minutes of aerobic exercise without signs/symptoms of physical distress Progress to 45 minutes of aerobic exercise without signs/symptoms of physical distress    Intensity THRR unchanged THRR unchanged THRR unchanged THRR unchanged      Progression   Progression Continue to progress workloads to maintain intensity without signs/symptoms of physical distress. Continue to progress workloads to maintain intensity without signs/symptoms of physical distress. Continue to progress workloads to maintain intensity without signs/symptoms of physical distress. Continue to  progress workloads to maintain intensity without signs/symptoms of physical distress.    Average METs 2.15 2.6 2.6 2.8      Resistance Training   Training Prescription Yes Yes Yes Yes    Weight 2 3 lbs 3 lbs 3 lbs    Reps 10-15 10-15 10-15 10-15      Interval Training   Interval Training No No No No      NuStep   Level  - 5 5 3     Watts  - -  2.8 METs -  2.8 METs -  2.1 METs    Minutes  - 15 15 15       REL-XR   Level 2 2 2 2     Minutes 15 15 15 15     METs 2.4 2.9 2.9 3.8      T5 Nustep   Level 2 2 2 2     Minutes 15 15 15 15     METs 1.9 2.1 2.1 2      Home Exercise Plan   Plans to continue exercise at  -  - [employer] (comment)  Walking and [employer] (comment)  Walking and [facility]    Frequency  -  - Add 3  additional days to program exercise sessions. Add 3 additional days to program exercise sessions.       Exercise Comments:     Exercise Comments    Row Name 12/17/15 0831 12/17/15 0954 12/25/15 1535 01/22/16 1518 01/28/16 1040   Exercise Comments First full day of exercise!  Patient was oriented to gym and equipment including functions, settings, policies, and procedures.  Patient's individual exercise prescription and treatment plan were reviewed.  All starting workloads were established based on the results of the 6 minute walk test done at initial orientation visit.  The plan for exercise progression was also introduced and progression will be customized based on patient's performance and goals. Jared Walker experienced 5/10 anginal symptoms.  Resolved with rest. Talking with Jared Walker, Jared Walker states that Jared Walker has had an increased use of his ntg over the past week. Swelling in Right leg greater than leg leg swelling. Jared Walker was advised to call Dr Dennie Maizes office today to see MD as soon as possibel ,today or this week. Jared Walker was also advised if symptoms increase Jared Walker needs to gfo to ED if not able to see MD.  Jared Walker has been doing well with  exercise.  Jared Walker has not had any more recurring symptoms during exercise.  Jared Walker has not tried to contact his doctor yet.  We will continue to monitor for progression. Jared Walker returned yesterday after being out since 8/3 for fluid build up.  Jared Walker was able to return without any problems and even increased to level 3 on the T5. Reviewed home exercise with pt today.  Pt plans to walk at home and go to [facility] for exercise.  Reviewed THR, pulse, RPE, sign and symptoms, NTG use, and when to call 911 or MD.  Also discussed weather considerations and indoor options.  Pt voiced understanding.   Jared Walker Name 02/05/16 1454           Exercise Comments Jared Walker has been doing fairly well in exercise.  His weight is up again, Jared Walker has contacted his doctor.  Jared Walker hopes to return tomorrow.  We will continue to monitor for progression.          Discharge Exercise Prescription (Final Exercise Prescription Changes):     Exercise Prescription Changes - 02/05/16 1400      Exercise Review   Progression Yes     Response to Exercise   Blood Pressure (Admit) 138/72   Blood Pressure (Exercise) 124/84   Blood Pressure (Exit) 130/80   Heart Rate (Admit) 73 bpm   Heart Rate (Exercise) 85 bpm   Heart Rate (Exit) 60 bpm   Rating of Perceived Exertion (Exercise) 13   Symptoms none   Comments Home Exercise Guidelines given 01/28/16   Duration Progress to 45 minutes of aerobic exercise without signs/symptoms of physical distress   Intensity THRR unchanged     Progression   Progression Continue to progress workloads to maintain intensity without signs/symptoms of physical distress.   Average METs 2.8     Resistance Training   Training Prescription Yes   Weight 3 lbs   Reps 10-15     Interval Training   Interval Training No     NuStep   Level 3   Watts --  2.1 METs   Minutes 15     REL-XR   Level 2   Minutes 15   METs 3.8     T5 Nustep   Level 2   Minutes 15   METs 2  Home Exercise Plan   Plans  to continue exercise at [facility] (comment)  Walking and [facility]   Frequency Add 3 additional days to program exercise sessions.      Nutrition:  Target Goals: Understanding of nutrition guidelines, daily intake of sodium 1500mg , cholesterol 200mg , calories 30% from fat and 7% or less from saturated fats, daily to have 5 or more servings of fruits and vegetables.  Biometrics:     Pre Biometrics - 12/11/15 1534      Pre Biometrics   Height 5' 10.3" (1.786 m)   Weight 210 lb 14.4 oz (95.7 kg)   Waist Circumference 43.75 inches   Hip Circumference 43 inches   Waist to Hip Ratio 1.02 %   BMI (Calculated) 30.1   Single Leg Stand 23 seconds       Nutrition Therapy Plan and Nutrition Goals:     Nutrition Therapy & Goals - 12/27/15 1144      Nutrition Therapy   Diet Instructed on heart healthy dietary guidelines including DASH diet principles.   Drug/Food Interactions Statins/Certain Fruits   Protein (specify units) 7   Fiber 30 grams   Whole Grain Foods 3 servings   Saturated Fats 12 max. grams   Fruits and Vegetables 5 servings/day   Sodium 2000 grams     Personal Nutrition Goals   Personal Goal #1 Continue to decrease sugar in coffee and sweet desserts (honey buns). Try Splenda in coffee.   Personal Goal #2 Read labels for saturated fat, trans fat and sodium.   Personal Goal #3 Consider making smoothie with low fat milk, yogurt and fruit verses buying mix.     Intervention Plan   Intervention Prescribe, educate and counsel regarding individualized specific dietary modifications aiming towards targeted core components such as weight, hypertension, lipid management, diabetes, heart failure and other comorbidities.;Nutrition handout(s) given to patient.   Expected Outcomes Short Term Goal: Understand basic principles of dietary content, such as calories, fat, sodium, cholesterol and nutrients.      Nutrition Discharge: Rate Your Plate  Scores:   Nutrition Goals Re-Evaluation:   Psychosocial: Target Goals: Acknowledge presence or absence of depression, maximize coping skills, provide positive support system. Participant is able to verbalize types and ability to use techniques and skills needed for reducing stress and depression.  Initial Review & Psychosocial Screening:     Initial Psych Review & Screening - 12/11/15 1539      Initial Review   Current issues with Current Stress Concerns     Family Dynamics   Good Support System? Yes     Screening Interventions   Interventions Encouraged to exercise;Program counselor consult      Quality of Life Scores:     Quality of Life - 12/13/15 1213      Quality of Life Scores   Health/Function Pre 14.2 %   Socioeconomic Pre 24.08 %   Psych/Spiritual Pre 20.57 %   Family Pre 26.4 %   GLOBAL Pre 19.2 %      PHQ-9: Recent Review Flowsheet Data    Depression screen [facility] 2/9 12/11/2015   Decreased Interest 3   Down, Depressed, Hopeless 3   PHQ - 2 Score 6   Altered sleeping 1   Tired, decreased energy 3   Change in appetite 0   Feeling bad or failure about yourself  2   Trouble concentrating 1   Moving slowly or fidgety/restless 1   Suicidal thoughts 0   PHQ-9 Score 14  Difficult doing work/chores Somewhat difficult      Psychosocial Evaluation and Intervention:   Psychosocial Re-Evaluation:     Psychosocial Re-Evaluation    Jared Walker Name 12/24/15 [id number]             Psychosocial Re-Evaluation   Comments Jared Walker ststed that Jared Walker is feeling much better since starting the progra. His mmod was low and has improved since starting profgram.  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  Jared Walker meets the seniors as they arrive and takes time to talk and enjoy their company.          Vocational Rehabilitation: Provide vocational rehab assistance to qualifying candidates.   Vocational Rehab Evaluation & Intervention:      Vocational Rehab - 12/11/15 1536      Initial Vocational Rehab Evaluation & Intervention   Assessment shows need for Vocational Rehabilitation No      Education: Education Goals: Education classes will be provided on a weekly basis, covering required topics. Participant will state understanding/return demonstration of topics presented.  Learning Barriers/Preferences:     Learning Barriers/Preferences - 12/11/15 1536      Learning Barriers/Preferences   Learning Barriers None   Learning Preferences Computer/Internet;Individual Instruction;Group Instruction      Education Topics: General Nutrition Guidelines/Fats and Fiber: -Group instruction provided by verbal, written material, models and posters to present the general guidelines for heart healthy nutrition. Gives an explanation and review of dietary fats and fiber. Flowsheet Row Cardiac Rehab from 02/11/2016 in [facility] Cardiac and Pulmonary Rehab  Date  12/24/15  Educator  CR  Instruction Review Code  2- meets goals/outcomes      Controlling Sodium/Reading Food Labels: -Group verbal and written material supporting the discussion of sodium use in heart healthy nutrition. Review and explanation with models, verbal and written materials for utilization of the food label. Flowsheet Row Cardiac Rehab from 02/11/2016 in [facility] Cardiac and Pulmonary Rehab  Date  12/31/15  Educator  Erlene Quan  Instruction Review Code  2- meets goals/outcomes      Exercise Physiology & Risk Factors: - Group verbal and written instruction with models to review the exercise physiology of the cardiovascular system and associated critical values. Details cardiovascular disease risk factors and the goals associated with each risk factor.   Aerobic Exercise & Resistance Training: - Gives group verbal and written discussion on the health impact of inactivity. On the components of aerobic and resistive training programs and the benefits of this training and  how to safely progress through these programs.   Flexibility, Balance, General Exercise Guidelines: - Provides group verbal and written instruction on the benefits of flexibility and balance training programs. Provides general exercise guidelines with specific guidelines to those with heart or lung disease. Demonstration and skill practice provided.   Stress Management: - Provides group verbal and written instruction about the health risks of elevated stress, cause of high stress, and healthy ways to reduce stress.   Depression: - Provides group verbal and written instruction on the correlation between heart/lung disease and depressed mood, treatment options, and the stigmas associated with seeking treatment.   Anatomy & Physiology of the Heart: - Group verbal and written instruction and models provide basic cardiac anatomy and physiology, with the coronary electrical and arterial systems. Review of: AMI, Angina, Valve disease, Heart Failure, Cardiac Arrhythmia, Pacemakers, and the ICD. Flowsheet Row Cardiac Rehab from 02/11/2016 in [facility] Cardiac and Pulmonary Rehab  Date  01/21/16  Educator  SB  Instruction Review Code  2- meets goals/outcomes      Cardiac Procedures: - Group verbal and written instruction and models to describe the testing methods done to diagnose heart disease. Reviews the outcomes of the test results. Describes the treatment choices: Medical Management, Angioplasty, or Coronary Bypass Surgery. Flowsheet Row Cardiac Rehab from 02/11/2016 in [facility] Cardiac and Pulmonary Rehab  Date  01/28/16  Educator  CE  Instruction Review Code  2- meets goals/outcomes      Cardiac Medications: - Group verbal and written instruction to review commonly prescribed medications for heart disease. Reviews the medication, class of the drug, and side effects. Includes the steps to properly store meds and maintain the prescription regimen. Flowsheet Row Cardiac Rehab from 02/11/2016 in [facility]  Cardiac and Pulmonary Rehab  Date  12/17/15 [Second Part]  Educator  SB  Instruction Review Code  2- meets goals/outcomes      Go Sex-Intimacy & Heart Disease, Get SMART - Goal Setting: - Group verbal and written instruction through game format to discuss heart disease and the return to sexual intimacy. Provides group verbal and written material to discuss and apply goal setting through the application of the S.M.A.R.T. Method. Flowsheet Row Cardiac Rehab from 02/11/2016 in [facility] Cardiac and Pulmonary Rehab  Date  01/28/16  Educator  CE  Instruction Review Code  2- meets goals/outcomes      Other Matters of the Heart: - Provides group verbal, written materials and models to describe Heart Failure, Angina, Valve Disease, and Diabetes in the realm of heart disease. Includes description of the disease process and treatment options available to the cardiac patient. Flowsheet Row Cardiac Rehab from 02/11/2016 in [facility] Cardiac and Pulmonary Rehab  Date  01/21/16  Educator  SB  Instruction Review Code  2- meets goals/outcomes      Exercise & Equipment Safety: - Individual verbal instruction and demonstration of equipment use and safety with use of the equipment. Flowsheet Row Cardiac Rehab from 02/11/2016 in [facility] Cardiac and Pulmonary Rehab  Date  12/11/15  Educator  C. enterkinRN  Instruction Review Code  1- partially meets, needs review/practice      Infection Prevention: - Provides verbal and written material to individual with discussion of infection control including proper hand washing and proper equipment cleaning during exercise session. Flowsheet Row Cardiac Rehab from 02/11/2016 in [facility] Cardiac and Pulmonary Rehab  Date  12/11/15  Educator  C. EnterkinRN  Instruction Review Code  2- meets goals/outcomes      Falls Prevention: - Provides verbal and written material to individual with discussion of falls prevention and safety. Flowsheet Row Cardiac Rehab from 02/11/2016 in [facility]  Cardiac and Pulmonary Rehab  Date  12/11/15  Educator  C. [place]  Instruction Review Code  2- meets goals/outcomes      Diabetes: - Individual verbal and written instruction to review signs/symptoms of diabetes, desired ranges of glucose level fasting, after meals and with exercise. Advice that pre and post exercise glucose checks will be done for 3 sessions at entry of program.    Knowledge Questionnaire Score:     Knowledge Questionnaire Score - 12/12/15 1521      Knowledge Questionnaire Score   Pre Score 21      Core Components/Risk Factors/Patient Goals at Admission:     Personal Goals and Risk Factors at Admission - 12/11/15 1539      Core Components/Risk Factors/Patient Goals on Admission   Sedentary Yes   Intervention Provide advice, education, support  and counseling about physical activity/exercise needs.;Develop an individualized exercise prescription for aerobic and resistive training based on initial evaluation findings, risk stratification, comorbidities and participant's personal goals.   Expected Outcomes Achievement of increased cardiorespiratory fitness and enhanced flexibility, muscular endurance and strength shown through measurements of functional capacity and personal statement of participant.   Hypertension Yes   Intervention Provide education on lifestyle modifcations including regular physical activity/exercise, weight management, moderate sodium restriction and increased consumption of fresh fruit, vegetables, and low fat dairy, alcohol moderation, and smoking cessation.;Monitor prescription use compliance.   Expected Outcomes Short Term: Continued assessment and intervention until BP is < 140/22mm HG in hypertensive participants. < 130/34mm HG in hypertensive participants with diabetes, heart failure or chronic kidney disease.;Long Term: Maintenance of blood pressure at goal levels.   Stress Yes   Intervention Offer individual and/or small group  education and counseling on adjustment to heart disease, stress management and health-related lifestyle change. Teach and support self-help strategies.;Refer participants experiencing significant psychosocial distress to appropriate mental health specialists for further evaluation and treatment. When possible, include family members and significant others in education/counseling sessions.   Expected Outcomes Short Term: Participant demonstrates changes in health-related behavior, relaxation and other stress management skills, ability to obtain effective social support, and compliance with psychotropic medications if prescribed.;Long Term: Emotional wellbeing is indicated by absence of clinically significant psychosocial distress or social isolation.      Core Components/Risk Factors/Patient Goals Review:      Goals and Risk Factor Review    Row Name 12/24/15 0955 01/28/16 1033           Core Components/Risk Factors/Patient Goals Review   Personal Goals Review Weight Management/Obesity;Hypertension;Stress Weight Management/Obesity;Sedentary;Increase Strength and Stamina;Heart Failure;Hypertension;Lipids;Stress      Review Spoke with Airrion today about risk factors listed above. Jared Walker volunteered that Jared Walker is eating less honey buns. His goal is to stop eating the honey buns and other sweets in order to aid his weight loss goal. Jared Walker had appointment this Friday and states is looking forward to talking with the RD.  Jared Walker is eating less red meat, decreased the saturated fats and eating lots of vegetables. Jared Walker has substituted yoguart for ice cream. Jared Walker continues to be compiant with his medications and has noticed an improvement with his BP since fgetting out of the hospital. Jared Walker rarely uses salt. Jared Walker weight was up again today.  Jared Walker is planning to take extra Lasix today.  Jared Walker has fluid built up in his belly.  Jared Walker mentioned that Jared Walker was feeling more depressed since less of his clothes are fitting.  We  discussed keeping a food diary to track his eating to determine if where adjustments can be made since Jared Walker thought Jared Walker was eating bettter.  Based off our converstation, Jared Walker is still eating a lot of salt.  His blood pressures have been good, and no problems with his statins..      Expected Outcomes Continues to work on decreasing the amount of sweets Jared Walker is eating every day to help decrease his weight. Continued management of his hypertension. Jared Walker is going to start watching his diet better and exercise more at home to help with weight control and fluid.         Core Components/Risk Factors/Patient Goals at Discharge (Final Review):      Goals and Risk Factor Review - 01/28/16 1033      Core Components/Risk Factors/Patient Goals Review   Personal Goals Review Weight Management/Obesity;Sedentary;Increase Strength and Stamina;Heart Failure;Hypertension;Lipids;Stress  Review Jared Walker's weight was up again today.  Jared Walker is planning to take extra Lasix today.  Jared Walker has fluid built up in his belly.  Jared Walker mentioned that Jared Walker was feeling more depressed since less of his clothes are fitting.  We discussed keeping a food diary to track his eating to determine if where adjustments can be made since Jared Walker thought Jared Walker was eating bettter.  Based off our converstation, Jared Walker is still eating a lot of salt.  His blood pressures have been good, and no problems with his statins..   Expected Outcomes Jared Walker is going to start watching his diet better and exercise more at home to help with weight control and fluid.      ITP Comments:     ITP Comments    Row Name 12/17/15 0831 12/17/15 0957 12/18/15 0914 12/19/15 0953 12/31/15 1035   ITP Comments Today was Deavin's first full day.  We will continue to follow ITP. Graison experienced 5/10 anginal symptoms.  Resolved with rest. Talking with Jared Walker, Jared Walker states that Jared Walker has had an increased use of his ntg over the past week. Swelling in Right leg greater than leg leg swelling. Jared Walker  was advised to call Dr Dennie Maizes office today to see MD as soon as possibel ,today or this week. Jared Walker was also advised if symptoms increase Jared Walker needs to gfo to ED if not able to see MD 30 day review. Continue with ITP Deno has not been able to talk with doctor [place] his chest pain.  Jared Walker was constantly put on hold and not able to talk with anyone.  Jared Walker has not has any pain since Tuesday. Marquize had a 6 pound weight increase today.  Jared Walker reported not taking his Lasix since last week due to it keeping him up at night. Jared Walker stated Jared Walker had been taking his daily prescribed dose of Lasix at 9 p.m. or 10 p.m.  nightly.   Re-educated patient about better times to take Lasix 20 mg once a day as prescribed.  Jared Walker plans to resume taking Lasix daily before 5 p.m. every day.     [place] Name 01/02/16 1022 01/15/16 0735 01/21/16 1036 02/05/16 1454     ITP Comments Nunzio's weight was down today 4 lbs.  Jared Walker has started taking his lasix in the morning now. 30 day review. Continue with ITP unless changes noted by Medical Director at signature of review. Luismario returned today.  His weight is down to 212 today.  Exercised without complaints. Geoffrey has missed the last two appointments, Jared Walker has been having increased swelling at home.  Jared Walker contacted his physcian and they are adjusting his medications.       Comments:

## 2016-02-13 ENCOUNTER — Encounter: Payer: Medicare Other | Admitting: *Deleted

## 2016-02-13 DIAGNOSIS — I214 Non-ST elevation (NSTEMI) myocardial infarction: Secondary | ICD-10-CM

## 2016-02-13 NOTE — Progress Notes (Signed)
Daily Session Note  Patient Details  Name: Jared Walker MRN: [id number] Date of Birth: 1951-01-19 Referring Provider:   Flowsheet Row Cardiac Rehab from 12/11/2015 in [facility]  Referring Provider  Stann Mainland      Encounter Date: 02/13/2016  Check In:     Session Check In - 02/13/16 0929      Check-In   Location [facility]-Cardiac & Pulmonary Rehab   Staff Present Alberteen Sam, MA, ACSM RCEP, Exercise Physiologist;Amanda Oletta Darter, BA, ACSM CEP, Exercise Physiologist;Carroll Enterkin, RN, BSN   Supervising physician immediately available to respond to emergencies See telemetry face sheet for immediately available ER MD   Medication changes reported     No   Fall or balance concerns reported    No   Warm-up and Cool-down Performed on first and last piece of equipment   Resistance Training Performed Yes   VAD Patient? No     Pain Assessment   Currently in Pain? No/denies   Multiple Pain Sites No         Goals Met:  Independence with exercise equipment Exercise tolerated well No report of cardiac concerns or symptoms Strength training completed today  Goals Unmet:  Not Applicable  Comments: Pt able to follow exercise prescription today without complaint.  Will continue to monitor for progression.    Dr. Emily Filbert is Medical Director for [place] and [facility].

## 2016-02-18 ENCOUNTER — Encounter: Payer: Medicare Other | Admitting: *Deleted

## 2016-02-18 DIAGNOSIS — I214 Non-ST elevation (NSTEMI) myocardial infarction: Secondary | ICD-10-CM

## 2016-02-18 NOTE — Progress Notes (Signed)
Daily Session Note  Patient Details  Name: Jared Walker MRN: [id number] Date of Birth: 10-10-1950 Referring Provider:   Flowsheet Row Cardiac Rehab from 12/11/2015 in [facility] Cardiac [facility] Re[facility]  Referring Provider  Stann Mainland      Encounter Date: 02/18/2016  Check In:     Session Check In - 02/18/16 5784      Check-In   Location [facility]-Cardiac & Pulmonary Rehab   Staff Present Alberteen Sam, MA, ACSM RCEP, Exercise Physiologist;Susanne Bice, RN, BSN, CCRP;Carroll Enterkin, RN, BSN   Supervising physician immediately available to respond to emergencies See telemetry face sheet for immediately available ER MD   Medication changes reported     No   Fall or balance concerns reported    No   Warm-up and Cool-down Performed on first and last piece of equipment   Resistance Training Performed Yes   VAD Patient? No     Pain Assessment   Currently in Pain? No/denies   Multiple Pain Sites No         Goals Met:  Independence with exercise equipment Exercise tolerated well No report of cardiac concerns or symptoms Strength training completed today  Goals Unmet:  Not Applicable  Comments: Pt able to follow exercise prescription today without complaint.  Will continue to monitor for progression.    Dr. Emily Filbert is Medical Director for [place] and [facility].

## 2016-02-20 ENCOUNTER — Encounter: Payer: Medicare Other | Admitting: *Deleted

## 2016-02-20 DIAGNOSIS — I214 Non-ST elevation (NSTEMI) myocardial infarction: Secondary | ICD-10-CM

## 2016-02-20 NOTE — Progress Notes (Signed)
Cardiac Individual Treatment Plan  Patient Details  Name: Jared Walker MRN: [id number] Date of Birth: Feb 14, 1951 Referring Provider:   Flowsheet Row Cardiac Rehab from 12/11/2015 in [facility] Cardiac and Pulmonary Rehab  Referring Provider  Stann Mainland      Initial Encounter Date:  Flowsheet Row Cardiac Rehab from 12/11/2015 in [facility] Cardiac and Pulmonary Rehab  Date  12/11/15  Referring Provider  Stann Mainland      Visit Diagnosis: NSTEMI (non-ST elevated myocardial infarction) [facility])  Patient's Home Medications on Admission:  Current Outpatient Prescriptions:  .  allopurinol (ZYLOPRIM) 100 MG tablet, Take 200 mg by mouth daily. *Take along with 300 mg tablet to equal total dose of 500 mg daily.*, Disp: , Rfl:  .  allopurinol (ZYLOPRIM) 300 MG tablet, Take 300 mg by mouth See admin instructions. *Take along with 2 tablets of 100 mg (200 mg) to equal total dose of 500 mg by mouth daily*, Disp: , Rfl:  .  amLODipine (NORVASC) 10 MG tablet, Take 10 mg by mouth., Disp: , Rfl:  .  aspirin EC 81 MG EC tablet, Take 1 tablet (81 mg total) by mouth daily., Disp: 120 tablet, Rfl: 0 .  atenolol (TENORMIN) 100 MG tablet, Take 100 mg by mouth every evening. , Disp: , Rfl:  .  atorvastatin (LIPITOR) 10 MG tablet, Take 80 mg by mouth., Disp: , Rfl:  .  Cholecalciferol (VITAMIN D-1000 MAX ST) 1000 units tablet, Take 1,000 Units by mouth daily., Disp: , Rfl:  .  cloNIDine (CATAPRES) 0.1 MG tablet, Take 0.1 mg by mouth at bedtime. , Disp: , Rfl:  .  Cyanocobalamin (RA VITAMIN B-12 TR) 1000 MCG TBCR, Take 1,000 mcg by mouth daily., Disp: , Rfl:  .  DESCOVY 200-25 MG tablet, Take 1 tablet by mouth at bedtime. , Disp: , Rfl: 3 .  diazepam (VALIUM) 5 MG tablet, Take 5 mg by mouth at bedtime. For sleep, Disp: , Rfl:  .  docusate sodium (COLACE) 100 MG capsule, Take 100 mg by mouth at bedtime. , Disp: , Rfl:  .  dolutegravir (TIVICAY) 50 MG tablet, Take 50 mg by mouth at bedtime. , Disp: , Rfl:  .  esomeprazole  (NEXIUM) 40 MG capsule, Take 1 capsule (40 mg total) by mouth 2 (two) times daily before a meal. AM (Patient taking differently: Take 40 mg by mouth every evening. AM), Disp: 60 capsule, Rfl: 3 .  ferrous sulfate 325 (65 FE) MG tablet, Take 325 mg by mouth 2 (two) times daily., Disp: , Rfl:  .  folic acid (FOLVITE) 1 MG tablet, Take 1 mg by mouth every evening. , Disp: , Rfl:  .  HYDROcodone-acetaminophen (NORCO/VICODIN) 5-325 MG tablet, Take 1 tablet by mouth See admin instructions. Take 1 tablet by mouth at bedtime if needed for pain, and Take 1 tablet by mouth 30 min before physical therapy., Disp: , Rfl: 0 .  losartan (COZAAR) 100 MG tablet, Take 100 mg by mouth every morning. , Disp: , Rfl: 0 .  nystatin (MYCOSTATIN) 100000 UNIT/ML suspension, Take 5 mLs (500,000 Units total) by mouth 4 (four) times daily. (Patient taking differently: Take 5 mLs by mouth 4 (four) times daily as needed. For throat pain.), Disp: 60 mL, Rfl: 0 .  omeprazole (PRILOSEC) 40 MG capsule, Take 40 mg by mouth every morning. , Disp: , Rfl: 0 .  sertraline (ZOLOFT) 50 MG tablet, Take 50 mg by mouth every evening. , Disp: , Rfl:   Past Medical History: Past Medical History:  Diagnosis Date  . Anemia   . Aortic stenosis, mild    mild AS by echo, 08/2011  . Chronic kidney disease    kidney stones  . Coronary artery disease   . Depression   . Full dentures    upper and lower  . Gout   . Heart murmur   . Hep C w/o coma, chronic ([place]) 01/19/2015  . Hepatitis C 09/25/11  . HIV positive ([place]) 09/25/11  . Hypertension   . MI (myocardial infarction) ([place])     Tobacco Use: History  Smoking Status  . Former Smoker  Smokeless Tobacco  . Former Systems developer  . Quit date: 03/13/1972    Labs: Recent Review Flowsheet Data    Labs for ITP Cardiac and Pulmonary Rehab Latest Ref Rng & Units 11/13/2011 11/10/2015   Cholestrol 0 - 200 mg/dL 132 120   LDLCALC 0 - 99 mg/dL 71 69   HDL >40 mg/dL 36(L) 30(L)   Trlycerides <150 mg/dL  124 107       Exercise Target Goals:    Exercise Program Goal: Individual exercise prescription set with THRR, safety & activity barriers. Participant demonstrates ability to understand and report RPE using BORG scale, to self-measure pulse accurately, and to acknowledge the importance of the exercise prescription.  Exercise Prescription Goal: Starting with aerobic activity 30 plus minutes a day, 3 days per week for initial exercise prescription. Provide home exercise prescription and guidelines that participant acknowledges understanding prior to discharge.  Activity Barriers & Risk Stratification:     Activity Barriers & Cardiac Risk Stratification - 12/11/15 1536      Activity Barriers & Cardiac Risk Stratification   Activity Barriers Deconditioning;Back Problems   Cardiac Risk Stratification High      6 Minute Walk:     6 Minute Walk    Row Name 12/11/15 1539         6 Minute Walk   Distance 1174 feet     Walk Time 6 minutes     # of Rest Breaks 0     MPH 2.22     METS 2.84     RPE 14     VO2 Peak 9.95     Symptoms Yes (comment)     Comments chronic back pain 8/10     Resting HR 60 bpm     Resting BP 132/80     Max Ex. HR 88 bpm     Max Ex. BP 132/72        Initial Exercise Prescription:     Initial Exercise Prescription - 12/11/15 1500      Date of Initial Exercise RX and Referring Provider   Date 12/11/15   Referring Provider Stann Mainland     Treadmill   MPH 2  only if it doesnt cause back pain   Grade 0   Minutes 15   METs 2.53     Recumbant Bike   Level 2   RPM 60   Watts 15   Minutes 15   METs 2.5     NuStep   Level 2   Watts 40   Minutes 15     Recumbant Elliptical   Level 2   RPM 60   Minutes 15   METs 2.5     REL-XR   Level 2   Minutes 15   METs 2.5     T5 Nustep   Level 2   Minutes 15   METs 2.5  Biostep-RELP   Level 2   Minutes 15   METs 2.5     Prescription Details   Frequency (times per week) 3    Duration Progress to 45 minutes of aerobic exercise without signs/symptoms of physical distress     Intensity   THRR 40-80% of Max Heartrate 98-137   Ratings of Perceived Exertion 11-13     Progression   Progression Continue to progress workloads to maintain intensity without signs/symptoms of physical distress.     Resistance Training   Training Prescription Yes   Weight 2   Reps 10-15      Perform Capillary Blood Glucose checks as needed.  Exercise Prescription Changes:     Exercise Prescription Changes    Row Name 12/25/15 1500 01/22/16 1500 01/28/16 1000 02/05/16 1400 02/19/16 1400     Exercise Review   Progression Yes Yes Yes Yes Yes     Response to Exercise   Blood Pressure (Admit) 126/70 132/64  - 138/72 112/64   Blood Pressure (Exercise) 154/78 144/74  - 124/84 126/70   Blood Pressure (Exit) 130/80 106/60  - 130/80 134/84   Heart Rate (Admit) 66 bpm 68 bpm  - 73 bpm 72 bpm   Heart Rate (Exercise) 95 bpm 85 bpm  - 85 bpm 101 bpm   Heart Rate (Exit) 58 bpm 61 bpm  - 60 bpm 62 bpm   Rating of Perceived Exertion (Exercise) 11 13  - 13 15   Symptoms none none none none none   Comments  -  - Home Exercise Guidelines given 01/28/16 Home Exercise Guidelines given 01/28/16 Home Exercise Guidelines given 01/28/16   Duration Progress to 45 minutes of aerobic exercise without signs/symptoms of physical distress Progress to 45 minutes of aerobic exercise without signs/symptoms of physical distress Progress to 45 minutes of aerobic exercise without signs/symptoms of physical distress Progress to 45 minutes of aerobic exercise without signs/symptoms of physical distress Progress to 45 minutes of aerobic exercise without signs/symptoms of physical distress   Intensity THRR unchanged THRR unchanged THRR unchanged THRR unchanged THRR unchanged     Progression   Progression Continue to progress workloads to maintain intensity without signs/symptoms of physical distress. Continue to  progress workloads to maintain intensity without signs/symptoms of physical distress. Continue to progress workloads to maintain intensity without signs/symptoms of physical distress. Continue to progress workloads to maintain intensity without signs/symptoms of physical distress. Continue to progress workloads to maintain intensity without signs/symptoms of physical distress.   Average METs 2.15 2.6 2.6 2.8 3.4     Resistance Training   Training Prescription Yes Yes Yes Yes Yes   Weight 2 3 lbs 3 lbs 3 lbs 6 lbs   Reps 10-15 10-15 10-15 10-15 10-15     Interval Training   Interval Training No No No No No     NuStep   Level  - 5 5 3 5    Watts  - -  2.8 METs -  2.8 METs -  2.1 METs -  3.6 METs   Minutes  - 15 15 15 15      REL-XR   Level 2 2 2 2 3    Minutes 15 15 15 15 15    METs 2.4 2.9 2.9 3.8 4.4     T5 Nustep   Level 2 2 2 2 5    Minutes 15 15 15 15 15    METs 1.9 2.1 2.1 2 2.2     Home Exercise Plan   Plans to continue  exercise at  -  - [employer] (comment)  Walking and [place] (comment)  Walking and [place] (comment)  Walking and [employer] 3 additional days to program exercise sessions. Add 3 additional days to program exercise sessions. Add 3 additional days to program exercise sessions.      Exercise Comments:     Exercise Comments    Row Name 12/17/15 0831 12/17/15 0954 12/25/15 1535 01/22/16 1518 01/28/16 1040   Exercise Comments First full day of exercise!  Patient was oriented to gym and equipment including functions, settings, policies, and procedures.  Patient's individual exercise prescription and treatment plan were reviewed.  All starting workloads were established based on the results of the 6 minute walk test done at initial orientation visit.  The plan for exercise progression was also introduced and progression will be customized based on patient's performance  and goals. Jared Walker experienced 5/10 anginal symptoms.  Resolved with rest. Talking with Jared Walker, Jared Walker states that Jared Walker has had an increased use of his ntg over the past week. Swelling in Right leg greater than leg leg swelling. Jared Walker was advised to call Dr Dennie Maizes office today to see MD as soon as possibel ,today or this week. Jared Walker was also advised if symptoms increase Jared Walker needs to gfo to ED if not able to see MD.  Jared Walker has been doing well with exercise.  Jared Walker has not had any more recurring symptoms during exercise.  Jared Walker has not tried to contact his doctor yet.  We will continue to monitor for progression. Jared Walker returned yesterday after being out since 8/3 for fluid build up.  Jared Walker was able to return without any problems and even increased to level 3 on the T5. Reviewed home exercise with pt today.  Pt plans to walk at home and go to [facility] for exercise.  Reviewed THR, pulse, RPE, sign and symptoms, NTG use, and when to call 911 or MD.  Also discussed weather considerations and indoor options.  Pt voiced understanding.   Jared Walker Name 02/05/16 1454 02/19/16 1438         Exercise Comments Jared Walker has been doing fairly well in exercise.  His weight is up again, Jared Walker has contacted his doctor.  Jared Walker hopes to return tomorrow.  We will continue to monitor for progression. Jared Walker is doing well with exercise.  His weight is still fluctuating.  Jared Walker is exercising some at home.  We will continue to monitor for progression.         Discharge Exercise Prescription (Final Exercise Prescription Changes):     Exercise Prescription Changes - 02/19/16 1400      Exercise Review   Progression Yes     Response to Exercise   Blood Pressure (Admit) 112/64   Blood Pressure (Exercise) 126/70   Blood Pressure (Exit) 134/84   Heart Rate (Admit) 72 bpm   Heart Rate (Exercise) 101 bpm   Heart Rate (Exit) 62 bpm   Rating of Perceived Exertion (Exercise) 15   Symptoms none   Comments Home Exercise Guidelines given 01/28/16    Duration Progress to 45 minutes of aerobic exercise without signs/symptoms of physical distress   Intensity THRR unchanged     Progression   Progression Continue to progress workloads to maintain intensity without signs/symptoms of physical distress.   Average METs 3.4     Resistance Training   Training Prescription Yes   Weight  6 lbs   Reps 10-15     Interval Training   Interval Training No     NuStep   Level 5   Watts --  3.6 METs   Minutes 15     REL-XR   Level 3   Minutes 15   METs 4.4     T5 Nustep   Level 5   Minutes 15   METs 2.2     Home Exercise Plan   Plans to continue exercise at [employer] (comment)  Walking and [facility]   Frequency Add 3 additional days to program exercise sessions.      Nutrition:  Target Goals: Understanding of nutrition guidelines, daily intake of sodium 1500mg , cholesterol 200mg , calories 30% from fat and 7% or less from saturated fats, daily to have 5 or more servings of fruits and vegetables.  Biometrics:     Pre Biometrics - 12/11/15 1534      Pre Biometrics   Height 5' 10.3" (1.786 m)   Weight 210 lb 14.4 oz (95.7 kg)   Waist Circumference 43.75 inches   Hip Circumference 43 inches   Waist to Hip Ratio 1.02 %   BMI (Calculated) 30.1   Single Leg Stand 23 seconds       Nutrition Therapy Plan and Nutrition Goals:     Nutrition Therapy & Goals - 12/27/15 1144      Nutrition Therapy   Diet Instructed on heart healthy dietary guidelines including DASH diet principles.   Drug/Food Interactions Statins/Certain Fruits   Protein (specify units) 7   Fiber 30 grams   Whole Grain Foods 3 servings   Saturated Fats 12 max. grams   Fruits and Vegetables 5 servings/day   Sodium 2000 grams     Personal Nutrition Goals   Personal Goal #1 Continue to decrease sugar in coffee and sweet desserts (honey buns). Try Splenda in coffee.   Personal Goal #2 Read labels for saturated fat, trans fat and sodium.    Personal Goal #3 Consider making smoothie with low fat milk, yogurt and fruit verses buying mix.     Intervention Plan   Intervention Prescribe, educate and counsel regarding individualized specific dietary modifications aiming towards targeted core components such as weight, hypertension, lipid management, diabetes, heart failure and other comorbidities.;Nutrition handout(s) given to patient.   Expected Outcomes Short Term Goal: Understand basic principles of dietary content, such as calories, fat, sodium, cholesterol and nutrients.      Nutrition Discharge: Rate Your Plate Scores:   Nutrition Goals Re-Evaluation:   Psychosocial: Target Goals: Acknowledge presence or absence of depression, maximize coping skills, provide positive support system. Participant is able to verbalize types and ability to use techniques and skills needed for reducing stress and depression.  Initial Review & Psychosocial Screening:     Initial Psych Review & Screening - 12/11/15 1539      Initial Review   Current issues with Current Stress Concerns     Family Dynamics   Good Support System? Yes     Screening Interventions   Interventions Encouraged to exercise;Program counselor consult      Quality of Life Scores:     Quality of Life - 12/13/15 1213      Quality of Life Scores   Health/Function Pre 14.2 %   Socioeconomic Pre 24.08 %   Psych/Spiritual Pre 20.57 %   Family Pre 26.4 %   GLOBAL Pre 19.2 %      PHQ-9: Recent Review Flowsheet Data  Depression screen PHQ 2/9 12/11/2015   Decreased Interest 3   Down, Depressed, Hopeless 3   PHQ - 2 Score 6   Altered sleeping 1   Tired, decreased energy 3   Change in appetite 0   Feeling bad or failure about yourself  2   Trouble concentrating 1   Moving slowly or fidgety/restless 1   Suicidal thoughts 0   PHQ-9 Score 14   Difficult doing work/chores Somewhat difficult      Psychosocial Evaluation and  Intervention:   Psychosocial Re-Evaluation:     Psychosocial Re-Evaluation    Row Name 12/24/15 0959 02/20/16 1209           Psychosocial Re-Evaluation   Comments Jared Walker ststed that Jared Walker is feeling much better since starting the progra. His mmod was low and has improved since starting profgram.  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  Jared Walker meets the seniors as they arrive and takes time to talk and enjoy their company. Jared Walker said Jared Walker knows his weight is up since Jared Walker feels Jared Walker is depressed. I encouraged him to make an appt with his primary care MD to rule out anything. Then I encouraged him to get some talk therapy/counseling. Jared Walker feels his exercise is going well.       Continued Psychosocial Services Needed  - Yes         Vocational Rehabilitation: Provide vocational rehab assistance to qualifying candidates.   Vocational Rehab Evaluation & Intervention:     Vocational Rehab - 12/11/15 1536      Initial Vocational Rehab Evaluation & Intervention   Assessment shows need for Vocational Rehabilitation No      Education: Education Goals: Education classes will be provided on a weekly basis, covering required topics. Participant will state understanding/return demonstration of topics presented.  Learning Barriers/Preferences:     Learning Barriers/Preferences - 12/11/15 1536      Learning Barriers/Preferences   Learning Barriers None   Learning Preferences Computer/Internet;Individual Instruction;Group Instruction      Education Topics: General Nutrition Guidelines/Fats and Fiber: -Group instruction provided by verbal, written material, models and posters to present the general guidelines for heart healthy nutrition. Gives an explanation and review of dietary fats and fiber. Flowsheet Row Cardiac Rehab from 02/18/2016 in [facility] Cardiac and Pulmonary Rehab  Date  02/18/16  Educator  CR  Instruction Review Code  R- Review/reinforce [second  class]      Controlling Sodium/Reading Food Labels: -Group verbal and written material supporting the discussion of sodium use in heart healthy nutrition. Review and explanation with models, verbal and written materials for utilization of the food label. Flowsheet Row Cardiac Rehab from 02/18/2016 in [facility] Cardiac and Pulmonary Rehab  Date  12/31/15  Educator  Erlene Quan  Instruction Review Code  2- meets goals/outcomes      Exercise Physiology & Risk Factors: - Group verbal and written instruction with models to review the exercise physiology of the cardiovascular system and associated critical values. Details cardiovascular disease risk factors and the goals associated with each risk factor.   Aerobic Exercise & Resistance Training: - Gives group verbal and written discussion on the health impact of inactivity. On the components of aerobic and resistive training programs and the benefits of this training and how to safely progress through these programs.   Flexibility, Balance, General Exercise Guidelines: - Provides group verbal and written instruction on the benefits of flexibility and balance training programs. Provides general exercise  guidelines with specific guidelines to those with heart or lung disease. Demonstration and skill practice provided.   Stress Management: - Provides group verbal and written instruction about the health risks of elevated stress, cause of high stress, and healthy ways to reduce stress.   Depression: - Provides group verbal and written instruction on the correlation between heart/lung disease and depressed mood, treatment options, and the stigmas associated with seeking treatment.   Anatomy & Physiology of the Heart: - Group verbal and written instruction and models provide basic cardiac anatomy and physiology, with the coronary electrical and arterial systems. Review of: AMI, Angina, Valve disease, Heart Failure, Cardiac Arrhythmia, Pacemakers, and  the ICD. Flowsheet Row Cardiac Rehab from 02/18/2016 in [facility] Cardiac and Pulmonary Rehab  Date  01/21/16  Educator  SB  Instruction Review Code  2- meets goals/outcomes      Cardiac Procedures: - Group verbal and written instruction and models to describe the testing methods done to diagnose heart disease. Reviews the outcomes of the test results. Describes the treatment choices: Medical Management, Angioplasty, or Coronary Bypass Surgery. Flowsheet Row Cardiac Rehab from 02/18/2016 in [facility] Cardiac and Pulmonary Rehab  Date  01/28/16  Educator  CE  Instruction Review Code  2- meets goals/outcomes      Cardiac Medications: - Group verbal and written instruction to review commonly prescribed medications for heart disease. Reviews the medication, class of the drug, and side effects. Includes the steps to properly store meds and maintain the prescription regimen. Flowsheet Row Cardiac Rehab from 02/18/2016 in [facility] Cardiac and Pulmonary Rehab  Date  12/17/15 [Second Part]  Educator  SB  Instruction Review Code  2- meets goals/outcomes      Go Sex-Intimacy & Heart Disease, Get SMART - Goal Setting: - Group verbal and written instruction through game format to discuss heart disease and the return to sexual intimacy. Provides group verbal and written material to discuss and apply goal setting through the application of the S.M.A.R.T. Method. Flowsheet Row Cardiac Rehab from 02/18/2016 in [facility] Cardiac and Pulmonary Rehab  Date  01/28/16  Educator  CE  Instruction Review Code  2- meets goals/outcomes      Other Matters of the Heart: - Provides group verbal, written materials and models to describe Heart Failure, Angina, Valve Disease, and Diabetes in the realm of heart disease. Includes description of the disease process and treatment options available to the cardiac patient. Flowsheet Row Cardiac Rehab from 02/18/2016 in [facility] Cardiac and Pulmonary Rehab  Date  01/21/16  Educator  SB   Instruction Review Code  2- meets goals/outcomes      Exercise & Equipment Safety: - Individual verbal instruction and demonstration of equipment use and safety with use of the equipment. Flowsheet Row Cardiac Rehab from 02/18/2016 in [facility] Cardiac and Pulmonary Rehab  Date  12/11/15  Educator  C. enterkinRN  Instruction Review Code  1- partially meets, needs review/practice      Infection Prevention: - Provides verbal and written material to individual with discussion of infection control including proper hand washing and proper equipment cleaning during exercise session. Flowsheet Row Cardiac Rehab from 02/18/2016 in [facility] Cardiac and Pulmonary Rehab  Date  12/11/15  Educator  C. EnterkinRN  Instruction Review Code  2- meets goals/outcomes      Falls Prevention: - Provides verbal and written material to individual with discussion of falls prevention and safety. Flowsheet Row Cardiac Rehab from 02/18/2016 in [facility] Cardiac and Pulmonary Rehab  Date  12/11/15  Educator  C. Wilkinson  Instruction Review Code  2- meets goals/outcomes      Diabetes: - Individual verbal and written instruction to review signs/symptoms of diabetes, desired ranges of glucose level fasting, after meals and with exercise. Advice that pre and post exercise glucose checks will be done for 3 sessions at entry of program.    Knowledge Questionnaire Score:     Knowledge Questionnaire Score - 12/12/15 1521      Knowledge Questionnaire Score   Pre Score 21      Core Components/Risk Factors/Patient Goals at Admission:     Personal Goals and Risk Factors at Admission - 12/11/15 1539      Core Components/Risk Factors/Patient Goals on Admission   Sedentary Yes   Intervention Provide advice, education, support and counseling about physical activity/exercise needs.;Develop an individualized exercise prescription for aerobic and resistive training based on initial evaluation findings, risk stratification,  comorbidities and participant's personal goals.   Expected Outcomes Achievement of increased cardiorespiratory fitness and enhanced flexibility, muscular endurance and strength shown through measurements of functional capacity and personal statement of participant.   Hypertension Yes   Intervention Provide education on lifestyle modifcations including regular physical activity/exercise, weight management, moderate sodium restriction and increased consumption of fresh fruit, vegetables, and low fat dairy, alcohol moderation, and smoking cessation.;Monitor prescription use compliance.   Expected Outcomes Short Term: Continued assessment and intervention until BP is < 140/35mm HG in hypertensive participants. < 130/66mm HG in hypertensive participants with diabetes, heart failure or chronic kidney disease.;Long Term: Maintenance of blood pressure at goal levels.   Stress Yes   Intervention Offer individual and/or small group education and counseling on adjustment to heart disease, stress management and health-related lifestyle change. Teach and support self-help strategies.;Refer participants experiencing significant psychosocial distress to appropriate mental health specialists for further evaluation and treatment. When possible, include family members and significant others in education/counseling sessions.   Expected Outcomes Short Term: Participant demonstrates changes in health-related behavior, relaxation and other stress management skills, ability to obtain effective social support, and compliance with psychotropic medications if prescribed.;Long Term: Emotional wellbeing is indicated by absence of clinically significant psychosocial distress or social isolation.      Core Components/Risk Factors/Patient Goals Review:      Goals and Risk Factor Review    Row Name 12/24/15 0955 01/28/16 1033 02/20/16 1205         Core Components/Risk Factors/Patient Goals Review   Personal Goals Review Weight  Management/Obesity;Hypertension;Stress Weight Management/Obesity;Sedentary;Increase Strength and Stamina;Heart Failure;Hypertension;Lipids;Stress  -     Review Spoke with Jared Walker today about risk factors listed above. Jared Walker volunteered that Jared Walker is eating less honey buns. His goal is to stop eating the honey buns and other sweets in order to aid his weight loss goal. Jared Walker had appointment this Friday and states is looking forward to talking with the RD.  Jared Walker is eating less red meat, decreased the saturated fats and eating lots of vegetables. Jared Walker has substituted yoguart for ice cream. Mayes continues to be compiant with his medications and has noticed an improvement with his BP since fgetting out of the hospital. Jared Walker rarely uses salt. Jared Walker's weight was up again today.  Jared Walker is planning to take extra Lasix today.  Jared Walker has fluid built up in his belly.  Jared Walker mentioned that Jared Walker was feeling more depressed since less of his clothes are fitting.  We discussed keeping a food diary to track his eating to determine if where adjustments can be made since  Jared Walker thought Jared Walker was eating bettter.  Based off our converstation, Jared Walker is still eating a lot of salt.  His blood pressures have been good, and no problems with his statins.Jared Walker said Jared Walker knows his weight is up since Jared Walker feels Jared Walker is depressed. I encouraged him to make an appt with his primary care MD to rule out anything. Then I encouraged him to get some talk therapy/counseling. Jared Walker feels his exercise is going well.      Expected Outcomes Continues to work on decreasing the amount of sweets Jared Walker is eating every day to help decrease his weight. Continued management of his hypertension. Jared Walker is going to start watching his diet better and exercise more at home to help with weight control and fluid. Healthier eating /healthier lifestyle.         Core Components/Risk Factors/Patient Goals at Discharge (Final Review):      Goals and Risk Factor Review - 02/20/16 1205      Core  Components/Risk Factors/Patient Goals Review   Review Jared Walker said Jared Walker knows his weight is up since Jared Walker feels Jared Walker is depressed. I encouraged him to make an appt with his primary care MD to rule out anything. Then I encouraged him to get some talk therapy/counseling. Jared Walker feels his exercise is going well.    Expected Outcomes Healthier eating /healthier lifestyle.       ITP Comments:     ITP Comments    Row Name 12/17/15 0831 12/17/15 0957 12/18/15 0914 12/19/15 0953 12/31/15 1035   ITP Comments Today was Jared Walker's first full day.  We will continue to follow ITP. Jared Walker experienced 5/10 anginal symptoms.  Resolved with rest. Talking with Jared Walker, Jared Walker states that Jared Walker has had an increased use of his ntg over the past week. Swelling in Right leg greater than leg leg swelling. Jared Walker was advised to call Dr Dennie Maizes office today to see MD as soon as possibel ,today or this week. Jared Walker was also advised if symptoms increase Jared Walker needs to gfo to ED if not able to see MD 30 day review. Continue with ITP Jared Walker has not been able to talk with doctor Jared Walker his chest pain.  Jared Walker was constantly put on hold and not able to talk with anyone.  Jared Walker has not has any pain since Tuesday. Jared Walker had a 6 pound weight increase today.  Jared Walker reported not taking his Lasix since last week due to it keeping him up at night. Jared Walker stated Jared Walker had been taking his daily prescribed dose of Lasix at 9 p.m. or 10 p.m.  nightly.   Re-educated patient about better times to take Lasix 20 mg once a day as prescribed.  Jared Walker plans to resume taking Lasix daily before 5 p.m. every day.     [place] Name 01/02/16 1022 01/15/16 0735 01/21/16 1036 02/05/16 1454     ITP Comments Jared Walker's weight was down today 4 lbs.  Jared Walker has started taking his lasix in the morning now. 30 day review. Continue with ITP unless changes noted by Medical Director at signature of review. Jared Walker returned today.  His weight is down to 212 today.  Exercised without complaints. Jared Walker has  missed the last two appointments, Jared Walker has been having increased swelling at home.  Jared Walker contacted his physcian and they are adjusting his medications.       Comments:

## 2016-02-20 NOTE — Progress Notes (Signed)
Daily Session Note  Patient Details  Name: Jared Walker MRN: [id number] Date of Birth: Jul 08, 1950 Referring Provider:   Flowsheet Row Cardiac Rehab from 12/11/2015 in [facility] Cardiac and Pulmonary Rehab  Referring Provider  Stann Mainland      Encounter Date: 02/20/2016  Check In:     Session Check In - 02/20/16 1201      Check-In   Location [facility]-Cardiac & Pulmonary Rehab   Staff Present Gerlene Burdock, RN, BSN;Jessica Luan Pulling, MA, ACSM RCEP, Exercise Physiologist;Amanda Oletta Darter, BA, ACSM CEP, Exercise Physiologist   Supervising physician immediately available to respond to emergencies See telemetry face sheet for immediately available ER MD   Medication changes reported     No   Fall or balance concerns reported    No   Warm-up and Cool-down Performed on first and last piece of equipment   Resistance Training Performed Yes   VAD Patient? No     Pain Assessment   Currently in Pain? No/denies           Exercise Prescription Changes - 02/19/16 1400      Exercise Review   Progression Yes     Response to Exercise   Blood Pressure (Admit) 112/64   Blood Pressure (Exercise) 126/70   Blood Pressure (Exit) 134/84   Heart Rate (Admit) 72 bpm   Heart Rate (Exercise) 101 bpm   Heart Rate (Exit) 62 bpm   Rating of Perceived Exertion (Exercise) 15   Symptoms none   Comments Home Exercise Guidelines given 01/28/16   Duration Progress to 45 minutes of aerobic exercise without signs/symptoms of physical distress   Intensity THRR unchanged     Progression   Progression Continue to progress workloads to maintain intensity without signs/symptoms of physical distress.   Average METs 3.4     Resistance Training   Training Prescription Yes   Weight 6 lbs   Reps 10-15     Interval Training   Interval Training No     NuStep   Level 5   Watts --  3.6 METs   Minutes 15     REL-XR   Level 3   Minutes 15   METs 4.4     T5 Nustep   Level 5   Minutes 15   METs 2.2     Home  Exercise Plan   Plans to continue exercise at [employer] (comment)  Walking and [facility]   Frequency Add 3 additional days to program exercise sessions.      Goals Met:  Proper associated with RPD/PD & O2 Sat Exercise tolerated well  Goals Unmet:  Not Applicable  Comments:     Dr. Emily Filbert is Medical Director for [facility] and [facility].

## 2016-02-25 ENCOUNTER — Encounter: Payer: Medicare Other | Admitting: *Deleted

## 2016-02-25 DIAGNOSIS — I214 Non-ST elevation (NSTEMI) myocardial infarction: Secondary | ICD-10-CM | POA: Diagnosis not present

## 2016-02-25 NOTE — Progress Notes (Signed)
Daily Session Note  Patient Details  Name: Jared Walker MRN: [id number] Date of Birth: Nov 28, 1950 Referring Provider:   Flowsheet Row Cardiac Rehab from 12/11/2015 in [facility] Cardiac and Pulmonary Rehab  Referring Provider  Stann Mainland      Encounter Date: 02/25/2016  Check In:     Session Check In - 02/25/16 1053      Check-In   Location [facility]-Cardiac & Pulmonary Rehab   Staff Present Heath Lark, RN, BSN, CCRP;Laureen Owens Shark, BS, RRT, Respiratory Therapist  Jaclynn Guarneri RN   Supervising physician immediately available to respond to emergencies See telemetry face sheet for immediately available ER MD   Medication changes reported     No   Fall or balance concerns reported    No   Warm-up and Cool-down Performed on first and last piece of equipment   Resistance Training Performed Yes   VAD Patient? No     Pain Assessment   Currently in Pain? No/denies         Goals Met:  Exercise tolerated well No report of cardiac concerns or symptoms Strength training completed today  Goals Unmet:  Not Applicable  Comments: Doing well with exercise prescription progression.    Dr. Emily Filbert is Medical Director for [place] and [facility].

## 2016-02-27 DIAGNOSIS — I214 Non-ST elevation (NSTEMI) myocardial infarction: Secondary | ICD-10-CM

## 2016-02-27 NOTE — Progress Notes (Signed)
Daily Session Note  Patient Details  Name: Jared Walker MRN: [id number] Date of Birth: Jan 12, 1951 Referring Provider:   Flowsheet Row Cardiac Rehab from 12/11/2015 in [facility] Cardiac and Pulmonary Rehab  Referring Provider  Stann Mainland      Encounter Date: 02/27/2016  Check In:     Session Check In - 02/27/16 1020      Check-In   Location [facility]-Cardiac & Pulmonary Rehab   Staff Present Heath Lark, RN, BSN, CCRP;Laureen Owens Shark, BS, RRT, Respiratory Dareen Piano, BA, ACSM CEP, Exercise Physiologist   Supervising physician immediately available to respond to emergencies See telemetry face sheet for immediately available ER MD   Medication changes reported     Yes   Comments D/C Lasix - now taking Torsemide 20 mg 2x day    Fall or balance concerns reported    No   Warm-up and Cool-down Performed on first and last piece of equipment   Resistance Training Performed Yes   VAD Patient? No         Goals Met:  Independence with exercise equipment Exercise tolerated well No report of cardiac concerns or symptoms Strength training completed today  Goals Unmet:  Not Applicable  Comments: Pt able to follow exercise prescription today without complaint.  Will continue to monitor for progression.    Dr. Emily Filbert is Medical Director for [place] and [facility].

## 2016-03-03 ENCOUNTER — Encounter: Payer: Medicare Other | Attending: Internal Medicine | Admitting: *Deleted

## 2016-03-03 DIAGNOSIS — I214 Non-ST elevation (NSTEMI) myocardial infarction: Secondary | ICD-10-CM

## 2016-03-03 NOTE — Progress Notes (Signed)
Daily Session Note  Patient Details  Name: Jared Walker MRN: [id number] Date of Birth: 1951/01/31 Referring Provider:   Flowsheet Row Cardiac Rehab from 12/11/2015 in [facility] Cardiac and Pulmonary Rehab  Referring Provider  Stann Mainland      Encounter Date: 03/03/2016  Check In:     Session Check In - 03/03/16 0827      Check-In   Staff Present Carson Myrtle, BS, RRT, Respiratory Therapist;Susanne Bice, RN, BSN, CCRP;Emanuel Dowson Luan Pulling, MA, ACSM RCEP, Exercise Physiologist   Supervising physician immediately available to respond to emergencies See telemetry face sheet for immediately available ER MD   Medication changes reported     No   Fall or balance concerns reported    No   Warm-up and Cool-down Performed on first and last piece of equipment   Resistance Training Performed Yes   VAD Patient? No     Pain Assessment   Currently in Pain? No/denies         Goals Met:  Independence with exercise equipment Exercise tolerated well No report of cardiac concerns or symptoms Strength training completed today  Goals Unmet:  Not Applicable  Comments: Pt able to follow exercise prescription today without complaint.  Will continue to monitor for progression.    Dr. Emily Filbert is Medical Director for [place] and [facility].

## 2016-03-10 ENCOUNTER — Encounter: Payer: Medicare Other | Admitting: *Deleted

## 2016-03-10 DIAGNOSIS — I214 Non-ST elevation (NSTEMI) myocardial infarction: Secondary | ICD-10-CM

## 2016-03-10 NOTE — Progress Notes (Signed)
Daily Session Note  Patient Details  Name: Jared Walker MRN: [id number] Date of Birth: [birth year] Referring Provider:   Flowsheet Row Cardiac Rehab from 12/11/2015 in [facility] Cardiac and Pulmonary Rehab  Referring Provider  Stann Mainland      Encounter Date: 03/10/2016  Check In:     Session Check In - 03/10/16 5638      Check-In   Staff Present Carson Myrtle, BS, RRT, Respiratory Therapist;Susanne Bice, RN, BSN, CCRP;Jessica Luan Pulling, MA, ACSM RCEP, Exercise Physiologist   Supervising physician immediately available to respond to emergencies See telemetry face sheet for immediately available ER MD   Medication changes reported     No   Fall or balance concerns reported    No   Warm-up and Cool-down Performed on first and last piece of equipment   Resistance Training Performed Yes     Pain Assessment   Currently in Pain? No/denies         Goals Met:  Independence with exercise equipment Exercise tolerated well No report of cardiac concerns or symptoms Strength training completed today  Goals Unmet:  Not Applicable  Comments: Doing well with exercise prescription progression.    Dr. Emily Filbert is Medical Director for [place] and [facility].

## 2016-03-10 NOTE — Unmapped (Signed)
Assessment/Plan:          Samuel [name] has experienced considerable deterioration during the past 3-6 months. Problems are noted below.   HIV:  Viral load well controlled on current ART (Descovy/dolutegravir), which he tolerates well. We discussed labs.  Will continue current regimen.    Cirrhosis: Severe fibrosis noted on 07/2015 liver US with elastography. Based on 01/2016 values, APRI =  1.166; FIB-4=3.211. MELD = 15. Exact cause of his pedal edema and abdominal distention is unclear; 01/2016 abdominal US did not reveal ascites. Needs EGD to determine whether or not he has varices. Appt with Dr Piedad Climes in GI scheduled for early Nov.    Chronic HCV Infection (genotype 1a): Given CKD with GFR of ~30 and cirrhosis, I have referred him to GI for HCV treatment. I suspect that best treatment option is probably a 12 wk course of daily sofosbuvir 400/velpatasivr 100.      S/p NSTEMI: 10/2015. Followed by Cardiology.    CKD: Stage 3 -- f/u with Nephrology in Nov. Has non-obstructing nephrolithiasis and bilateral renal cysts. HCV may be contributing to his renal dysfunction.    HTN:  Improved control today.    Sexual Health:     Mental Health: Stable.    Health maintenance: Influenza vaccine.      Prevention, adherence and health education:   .  .  .  .  .  .  .    Educational and counseling services took 15 minutes of today's visit.    Follow-up:  Return to clinic in 1 month or sooner if needed.   Subjective:    Samuel [name] is a 65 y.o. male who presents to the Infectious Disease clinic for return HIV visit.     Chief Complaint: No chief complaint on file.      HPI:   HIV diagnosed in 12/1999. In 0120/02, CD4 count was 547 (25%) with a viral load of about 5293. For a number of years, his viral load and CD4 count were generally reasonably well maintained in the absence of antiretroviral therapy, and he never had any HIV-related symptoms. However, during 2008-2009, his viral load dramatically increased and his CD4 count fell. Viral load reached a height of 309,000 on 08/31/2007 and CD4 count fell substantially to 244 on 09/29/2007. He had always been reluctant to take antiviral medications and in fact consistently and repeatedly refused hepatitis C treatment as well as HIV treatment in the past. However, because of the deterioration of his CD4 count and viral load, I prescribed Atripla on 10/12/07. Although he agreed to take it he did not done so until more than a year later, despite assurances during each visit that he would begin ART immediately. Once he finally started ART, however, he tolerated it quite well. On 07/24/2015 he was switched from Atripla to Descovy/dolutegravir b/o CKD. Of note: HLA [id number] by flow neg 09/2007.    Continues to have significant bilat leg edema and abdominal distention. Seen by Cardiology; pro-BNP on 10/6 not elevated (158).      Past Medical History:   Diagnosis Date   ??? CKD (chronic kidney disease) stage 3, GFR 30-59 ml/min 01/08/2015   ??? Coronary artery disease ([facility]) 10/2015    Multivessel disease, plan medical management after cath 6/17   ??? Gout    ??? HIV (human immunodeficiency virus infection) ([facility])     Undectable viral load 5/17   ??? Hypertension ([facility])    ??? Nephrotic syndrome 03/05/2014   ??? Renal  mass     Followed by neprhology, MRI 8/16 improved, felt to be benign       Medications:  Current Outpatient Prescriptions   Medication Sig Dispense Refill   ??? allopurinol (ZYLOPRIM) 300 MG tablet Take 300 mg by mouth daily.   0   ??? amLODIPine (NORVASC) 10 MG tablet Take 1 tablet (10 mg total) by mouth every morning before breakfast. 30 tablet 11   ??? aspirin 81 MG chewable tablet Chew 1 tablet (81 mg total) daily. 30 tablet 0   ??? atorvastatin (LIPITOR) 10 MG tablet Take 8 tablets (80 mg total) by mouth daily. 240 tablet 0   ??? atorvastatin (LIPITOR) 80 MG tablet Take 80 mg by mouth every morning before breakfast.     ??? carvedilol (COREG) 12.5 MG tablet Take 1 tablet (12.5 mg total) by mouth Two (2) times a day. 60 tablet 3   ??? clopidogrel (PLAVIX) 75 mg tablet take 1 tablet by mouth once daily     ??? diazepam (VALIUM) 5 MG tablet Take 5 mg by mouth nightly.      ??? docusate sodium (COLACE) 100 MG capsule Take 1 capsule (100 mg total) by mouth every twelve (12) hours. 60 capsule 0   ??? dolutegravir (TIVICAY) 50 mg Tab TABLET Take 1 tablet (50 mg total) by mouth daily. 31 tablet 11   ??? emtricitabine-tenofovir alafen (DESCOVY) 200-25 mg tablet Take 1 tablet by mouth daily. 31 tablet 11   ??? ferrous sulfate 325 (65 FE) MG tablet Take 325 mg by mouth.     ??? folic acid (FOLVITE) 1 MG tablet   0   ??? HYDROcodone-acetaminophen (NORCO) 5-325 mg per tablet Directions are take one tablet 30 minutes before physical therapy, then take one tablet at bedtime as needed for pain     ??? isosorbide mononitrate (IMDUR) 60 MG 24 hr tablet Take 1 tablet (60 mg total) by mouth daily. 30 tablet 11   ??? losartan (COZAAR) 100 MG tablet Take 1 tablet (100 mg total) by mouth daily. 30 tablet 0   ??? losartan (COZAAR) 100 MG tablet Take 100 mg by mouth daily.     ??? naproxen (NAPROSYN) 500 MG tablet Take 250 mg by mouth Two (2) times a day.  0   ??? nitroglycerin (NITROSTAT) 0.4 MG SL tablet Place 1 tablet (0.4 mg total) under the tongue every five (5) minutes as needed for chest pain. 25 tablet 1   ??? nystatin (MYCOSTATIN) 100,000 unit/mL suspension Take 500,000 Units by mouth Four (4) times a day.     ??? ondansetron (ZOFRAN) 4 MG tablet take 1 tablet by mouth every 8 hours if needed for nausea     ??? pantoprazole (PROTONIX) 40 MG tablet Take 40 mg by mouth daily.     ??? sertraline (ZOLOFT) 50 MG tablet take 1 tablet by mouth once daily     ??? torsemide (DEMADEX) 20 MG tablet Take 20 mg by mouth.       No current facility-administered medications for this visit.        Allergies: Buchu-cornsilk-ch grass-hydran; Colchicine analogues; and Tramadol    Social History:  General:  .  .  .  .  Marland Kitchen    Sexual History:  .  .  .  .  .  .  .  .  .    Substance Use:  . .  .  .  .  .  .    Reproductive:  .  Marland Kitchen  Marland Kitchen  Review of Systems:  A 12 point review of systems was negative except for pertinent items noted in the HPI.    Objective:       There were no vitals taken for this visit.    GEN:  looks well, no apparent distress  EYES: sclerae anicteric and non injected  MWU:XLKGMWNU on top and bottom. No lesions.  LYMPH:no cervical or supraclavicular LAD  CV:no peripheral edema, normal pulses. Nl s1, s2, without s3, s4 +2/6 sys m diffusely over precordium (except no radiation to neck or axilla)  PULM:CTAB ant/post, normal work of breathing  UVO:ZDGUYQIHK, nontender, no masses felt.  VQ:QVZDGLOV  RECTAL:deferred  SKIN:no petechiae, ecchymoses or obvious rashes on clothed exam  MSK:no joint tenderness; however, 2+ pitting edema bilat  NEURO:CN II-XII intact  PSYCH:attentive, appropriate affect, good eye contact, fluent speech    Recent Labs:  Absolute CD4 Count   Date Value Ref Range Status   10/16/2015 487 (L) 510 - 2,320 /uL Final   07/24/2015 477 (L) 510 - 2,320 /uL Final   06/13/2015 569 510 - 2,320 /uL Final   08/01/2014 514 510 - 2,320 /uL Final   01/03/2014 430 (L) 510 - 2,320 /uL Final   11/23/2012 549 510 - 2,320 /uL Final   07/20/2012 470 (L) 510 - 2,320 CELLS/UL Final      Lab Results   Component Value Date    WBC 7.4 02/05/2016    WBC 8.4 08/01/2014    HGB 13.0 (L) 02/05/2016    HGB 11.1 (L) 08/01/2014    PLT 145 (L) 02/05/2016    PLT 181 08/01/2014    CREATININE 2.00 (H) 03/06/2016    CREATININE 1.68 (H) 08/15/2014    AST 93 (H) 02/05/2016    AST 50 08/01/2014    ALT 164 (H) 02/05/2016    ALT 60 08/01/2014    TRIG 155 (H) 11/12/2015    TRIG 80 01/03/2014    HDL 37 (L) 11/12/2015    HDL 53 01/03/2014    LDL 80 11/12/2015    LDL 71 01/03/2014       Immunization History   Administered Date(s) Administered   ??? INFLUENZA TIV (TRI) PF (IM) 02/08/2008, 03/26/2010, 03/25/2011   ??? Influenza Vaccine Quad (IIV4 PF) 65mo+ injectable 07/24/2015, 02/05/2016   ??? Influenza Virus Vaccine, unspecified formulation 06/15/2014, 07/24/2015   ??? PPD Test 12/29/2006, 07/11/2008, 03/26/2010, 03/25/2011   ??? Pneumococcal Conjugate 13-Valent 04/13/2012   ??? Pneumococcal Polysaccharide 23 07/13/2000, 08/19/2005   ??? TdaP 08/31/2007

## 2016-03-11 ENCOUNTER — Encounter: Payer: Self-pay | Admitting: *Deleted

## 2016-03-11 DIAGNOSIS — I214 Non-ST elevation (NSTEMI) myocardial infarction: Secondary | ICD-10-CM

## 2016-03-11 NOTE — Progress Notes (Signed)
Cardiac Individual Treatment Plan  Patient Details  Name: Jared Walker MRN: [id number] Date of Birth: Feb 14, 1951 Referring Provider:   Flowsheet Row Cardiac Rehab from 12/11/2015 in [facility] Cardiac and Pulmonary Rehab  Referring Provider  Stann Mainland      Initial Encounter Date:  Flowsheet Row Cardiac Rehab from 12/11/2015 in [facility] Cardiac and Pulmonary Rehab  Date  12/11/15  Referring Provider  Stann Mainland      Visit Diagnosis: NSTEMI (non-ST elevated myocardial infarction) [facility])  Patient's Home Medications on Admission:  Current Outpatient Prescriptions:  .  allopurinol (ZYLOPRIM) 100 MG tablet, Take 200 mg by mouth daily. *Take along with 300 mg tablet to equal total dose of 500 mg daily.*, Disp: , Rfl:  .  allopurinol (ZYLOPRIM) 300 MG tablet, Take 300 mg by mouth See admin instructions. *Take along with 2 tablets of 100 mg (200 mg) to equal total dose of 500 mg by mouth daily*, Disp: , Rfl:  .  amLODipine (NORVASC) 10 MG tablet, Take 10 mg by mouth., Disp: , Rfl:  .  aspirin EC 81 MG EC tablet, Take 1 tablet (81 mg total) by mouth daily., Disp: 120 tablet, Rfl: 0 .  atenolol (TENORMIN) 100 MG tablet, Take 100 mg by mouth every evening. , Disp: , Rfl:  .  atorvastatin (LIPITOR) 10 MG tablet, Take 80 mg by mouth., Disp: , Rfl:  .  Cholecalciferol (VITAMIN D-1000 MAX ST) 1000 units tablet, Take 1,000 Units by mouth daily., Disp: , Rfl:  .  cloNIDine (CATAPRES) 0.1 MG tablet, Take 0.1 mg by mouth at bedtime. , Disp: , Rfl:  .  Cyanocobalamin (RA VITAMIN B-12 TR) 1000 MCG TBCR, Take 1,000 mcg by mouth daily., Disp: , Rfl:  .  DESCOVY 200-25 MG tablet, Take 1 tablet by mouth at bedtime. , Disp: , Rfl: 3 .  diazepam (VALIUM) 5 MG tablet, Take 5 mg by mouth at bedtime. For sleep, Disp: , Rfl:  .  docusate sodium (COLACE) 100 MG capsule, Take 100 mg by mouth at bedtime. , Disp: , Rfl:  .  dolutegravir (TIVICAY) 50 MG tablet, Take 50 mg by mouth at bedtime. , Disp: , Rfl:  .  esomeprazole  (NEXIUM) 40 MG capsule, Take 1 capsule (40 mg total) by mouth 2 (two) times daily before a meal. AM (Patient taking differently: Take 40 mg by mouth every evening. AM), Disp: 60 capsule, Rfl: 3 .  ferrous sulfate 325 (65 FE) MG tablet, Take 325 mg by mouth 2 (two) times daily., Disp: , Rfl:  .  folic acid (FOLVITE) 1 MG tablet, Take 1 mg by mouth every evening. , Disp: , Rfl:  .  HYDROcodone-acetaminophen (NORCO/VICODIN) 5-325 MG tablet, Take 1 tablet by mouth See admin instructions. Take 1 tablet by mouth at bedtime if needed for pain, and Take 1 tablet by mouth 30 min before physical therapy., Disp: , Rfl: 0 .  losartan (COZAAR) 100 MG tablet, Take 100 mg by mouth every morning. , Disp: , Rfl: 0 .  nystatin (MYCOSTATIN) 100000 UNIT/ML suspension, Take 5 mLs (500,000 Units total) by mouth 4 (four) times daily. (Patient taking differently: Take 5 mLs by mouth 4 (four) times daily as needed. For throat pain.), Disp: 60 mL, Rfl: 0 .  omeprazole (PRILOSEC) 40 MG capsule, Take 40 mg by mouth every morning. , Disp: , Rfl: 0 .  sertraline (ZOLOFT) 50 MG tablet, Take 50 mg by mouth every evening. , Disp: , Rfl:   Past Medical History: Past Medical History:  Diagnosis Date  . Anemia   . Aortic stenosis, mild    mild AS by echo, 08/2011  . Chronic kidney disease    kidney stones  . Coronary artery disease   . Depression   . Full dentures    upper and lower  . Gout   . Heart murmur   . Hep C w/o coma, chronic ([place]) 01/19/2015  . Hepatitis C 09/25/11  . HIV positive ([place]) 09/25/11  . Hypertension   . MI (myocardial infarction)     Tobacco Use: History  Smoking Status  . Former Smoker  Smokeless Tobacco  . Former Systems developer  . Quit date: 03/13/1972    Labs: Recent Review Flowsheet Data    Labs for ITP Cardiac and Pulmonary Rehab Latest Ref Rng & Units 11/13/2011 11/10/2015   Cholestrol 0 - 200 mg/dL 132 120   LDLCALC 0 - 99 mg/dL 71 69   HDL >40 mg/dL 36(L) 30(L)   Trlycerides <150 mg/dL 124  107       Exercise Target Goals:    Exercise Program Goal: Individual exercise prescription set with THRR, safety & activity barriers. Participant demonstrates ability to understand and report RPE using BORG scale, to self-measure pulse accurately, and to acknowledge the importance of the exercise prescription.  Exercise Prescription Goal: Starting with aerobic activity 30 plus minutes a day, 3 days per week for initial exercise prescription. Provide home exercise prescription and guidelines that participant acknowledges understanding prior to discharge.  Activity Barriers & Risk Stratification:     Activity Barriers & Cardiac Risk Stratification - 12/11/15 1536      Activity Barriers & Cardiac Risk Stratification   Activity Barriers Deconditioning;Back Problems   Cardiac Risk Stratification High      6 Minute Walk:     6 Minute Walk    Row Name 12/11/15 1539         6 Minute Walk   Distance 1174 feet     Walk Time 6 minutes     # of Rest Breaks 0     MPH 2.22     METS 2.84     RPE 14     VO2 Peak 9.95     Symptoms Yes (comment)     Comments chronic back pain 8/10     Resting HR 60 bpm     Resting BP 132/80     Max Ex. HR 88 bpm     Max Ex. BP 132/72        Initial Exercise Prescription:     Initial Exercise Prescription - 12/11/15 1500      Date of Initial Exercise RX and Referring Provider   Date 12/11/15   Referring Provider Stann Mainland     Treadmill   MPH 2  only if it doesnt cause back pain   Grade 0   Minutes 15   METs 2.53     Recumbant Bike   Level 2   RPM 60   Watts 15   Minutes 15   METs 2.5     NuStep   Level 2   Watts 40   Minutes 15     Recumbant Elliptical   Level 2   RPM 60   Minutes 15   METs 2.5     REL-XR   Level 2   Minutes 15   METs 2.5     T5 Nustep   Level 2   Minutes 15   METs 2.5     Biostep-RELP  Level 2   Minutes 15   METs 2.5     Prescription Details   Frequency (times per week) 3   Duration  Progress to 45 minutes of aerobic exercise without signs/symptoms of physical distress     Intensity   THRR 40-80% of Max Heartrate 98-137   Ratings of Perceived Exertion 11-13     Progression   Progression Continue to progress workloads to maintain intensity without signs/symptoms of physical distress.     Resistance Training   Training Prescription Yes   Weight 2   Reps 10-15      Perform Capillary Blood Glucose checks as needed.  Exercise Prescription Changes:     Exercise Prescription Changes    Row Name 12/25/15 1500 01/22/16 1500 01/28/16 1000 02/05/16 1400 02/19/16 1400     Exercise Review   Progression Yes Yes Yes Yes Yes     Response to Exercise   Blood Pressure (Admit) 126/70 132/64  - 138/72 112/64   Blood Pressure (Exercise) 154/78 144/74  - 124/84 126/70   Blood Pressure (Exit) 130/80 106/60  - 130/80 134/84   Heart Rate (Admit) 66 bpm 68 bpm  - 73 bpm 72 bpm   Heart Rate (Exercise) 95 bpm 85 bpm  - 85 bpm 101 bpm   Heart Rate (Exit) 58 bpm 61 bpm  - 60 bpm 62 bpm   Rating of Perceived Exertion (Exercise) 11 13  - 13 15   Symptoms none none none none none   Comments  -  - Home Exercise Guidelines given 01/28/16 Home Exercise Guidelines given 01/28/16 Home Exercise Guidelines given 01/28/16   Duration Progress to 45 minutes of aerobic exercise without signs/symptoms of physical distress Progress to 45 minutes of aerobic exercise without signs/symptoms of physical distress Progress to 45 minutes of aerobic exercise without signs/symptoms of physical distress Progress to 45 minutes of aerobic exercise without signs/symptoms of physical distress Progress to 45 minutes of aerobic exercise without signs/symptoms of physical distress   Intensity THRR unchanged THRR unchanged THRR unchanged THRR unchanged THRR unchanged     Progression   Progression Continue to progress workloads to maintain intensity without signs/symptoms of physical distress. Continue to progress  workloads to maintain intensity without signs/symptoms of physical distress. Continue to progress workloads to maintain intensity without signs/symptoms of physical distress. Continue to progress workloads to maintain intensity without signs/symptoms of physical distress. Continue to progress workloads to maintain intensity without signs/symptoms of physical distress.   Average METs 2.15 2.6 2.6 2.8 3.4     Resistance Training   Training Prescription Yes Yes Yes Yes Yes   Weight 2 3 lbs 3 lbs 3 lbs 6 lbs   Reps 10-15 10-15 10-15 10-15 10-15     Interval Training   Interval Training No No No No No     NuStep   Level  - '5 5 3 5   ' Watts  - -  2.8 METs -  2.8 METs -  2.1 METs -  3.6 METs   Minutes  - '15 15 15 15     ' REL-XR   Level '2 2 2 2 3   ' Minutes '15 15 15 15 15   ' METs 2.4 2.9 2.9 3.8 4.4     T5 Nustep   Level '2 2 2 2 5   ' Minutes '15 15 15 15 15   ' METs 1.9 2.1 2.1 2 2.2     Home Exercise Plan   Plans to continue exercise at  -  -  Forensic scientist (comment)  [employer] and [employer] (comment)  [employer] and [employer] (comment)  [facility] and [employer]   Frequency  -  - Add 3 additional days to program exercise sessions. Add 3 additional days to program exercise sessions. Add 3 additional days to program exercise sessions.   Row Name 03/05/16 1500             Exercise Review   Progression Yes         Response to Exercise   Blood Pressure (Admit) 138/80       Blood Pressure (Exercise) 134/60       Blood Pressure (Exit) 128/60       Heart Rate (Admit) 56 bpm       Heart Rate (Exercise) 89 bpm       Heart Rate (Exit) 57 bpm       Rating of Perceived Exertion (Exercise) 15       Symptoms none       Comments Home Exercise Guidelines given 01/28/16       Duration Progress to 45 minutes of aerobic exercise without signs/symptoms of physical distress       Intensity THRR unchanged         Progression   Progression  Continue to progress workloads to maintain intensity without signs/symptoms of physical distress.       Average METs 2.7         Resistance Training   Training Prescription Yes       Weight 6 lbs       Reps 10-15         Interval Training   Interval Training No         NuStep   Level 5       Watts -  2.3 METs       Minutes 15         REL-XR   Level 5       Minutes 15       METs 3.1         Home Exercise Plan   Plans to continue exercise at [employer] (comment)  Walking and [facility]       Frequency Add 3 additional days to program exercise sessions.          Exercise Comments:     Exercise Comments    Row Name 12/17/15 0831 12/17/15 0954 12/25/15 1535 01/22/16 1518 01/28/16 1040   Exercise Comments First full day of exercise!  Patient was oriented to gym and equipment including functions, settings, policies, and procedures.  Patient's individual exercise prescription and treatment plan were reviewed.  All starting workloads were established based on the results of the 6 minute walk test done at initial orientation visit.  The plan for exercise progression was also introduced and progression will be customized based on patient's performance and goals. Gregrey experienced 5/10 anginal symptoms.  Resolved with rest. Talking with Birdena Crandall, he states that he has had an increased use of his ntg over the past week. Swelling in Right leg greater than leg leg swelling. He was advised to call Dr Dennie Maizes office today to see MD as soon as possibel ,today or this week. He was also advised if symptoms increase he needs to gfo to ED if not able to see MD.  Birdena Crandall has been doing well with exercise.  He has not had any more recurring symptoms during exercise.  He has  not tried to contact his doctor yet.  We will continue to monitor for progression. Edwards returned yesterday after being out since 8/3 for fluid build up.  He was able to return without any problems and even  increased to level 3 on the T5. Reviewed home exercise with pt today.  Pt plans to walk at home and go to [facility] for exercise.  Reviewed THR, pulse, RPE, sign and symptoms, NTG use, and when to call 911 or MD.  Also discussed weather considerations and indoor options.  Pt voiced understanding.   [place] Name 02/05/16 1454 02/19/16 1438 03/05/16 1530       Exercise Comments Patrick has been doing fairly well in exercise.  His weight is up again, he has contacted his doctor.  He hopes to return tomorrow.  We will continue to monitor for progression. Babak is doing well with exercise.  His weight is still fluctuating.  He is exercising some at home.  We will continue to monitor for progression. Trino continues to do well with exercise when he attends.  His attendance can fluctuate.  We will continue to monitor his progress.        Discharge Exercise Prescription (Final Exercise Prescription Changes):     Exercise Prescription Changes - 03/05/16 1500      Exercise Review   Progression Yes     Response to Exercise   Blood Pressure (Admit) 138/80   Blood Pressure (Exercise) 134/60   Blood Pressure (Exit) 128/60   Heart Rate (Admit) 56 bpm   Heart Rate (Exercise) 89 bpm   Heart Rate (Exit) 57 bpm   Rating of Perceived Exertion (Exercise) 15   Symptoms none   Comments Home Exercise Guidelines given 01/28/16   Duration Progress to 45 minutes of aerobic exercise without signs/symptoms of physical distress   Intensity THRR unchanged     Progression   Progression Continue to progress workloads to maintain intensity without signs/symptoms of physical distress.   Average METs 2.7     Resistance Training   Training Prescription Yes   Weight 6 lbs   Reps 10-15     Interval Training   Interval Training No     NuStep   Level 5   Watts --  2.3 METs   Minutes 15     REL-XR   Level 5   Minutes 15   METs 3.1     Home Exercise Plan   Plans to continue exercise at [employer] (comment)  Walking and [facility]   Frequency Add 3 additional days to program exercise sessions.      Nutrition:  Target Goals: Understanding of nutrition guidelines, daily intake of sodium <1534m, cholesterol <2078m calories 30% from fat and 7% or less from saturated fats, daily to have 5 or more servings of fruits and vegetables.  Biometrics:     Pre Biometrics - 12/11/15 1534      Pre Biometrics   Height 5' 10.3" (1.786 m)   Weight 210 lb 14.4 oz (95.7 kg)   Waist Circumference 43.75 inches   Hip Circumference 43 inches   Waist to Hip Ratio 1.02 %   BMI (Calculated) 30.1   Single Leg Stand 23 seconds       Nutrition Therapy Plan and Nutrition Goals:     Nutrition Therapy & Goals - 12/27/15 1144      Nutrition Therapy   Diet Instructed on heart healthy dietary guidelines including DASH diet principles.   Drug/Food  Interactions Statins/Certain Fruits   Protein (specify units) 7   Fiber 30 grams   Whole Grain Foods 3 servings   Saturated Fats 12 max. grams   Fruits and Vegetables 5 servings/day   Sodium 2000 grams     Personal Nutrition Goals   Personal Goal #1 Continue to decrease sugar in coffee and sweet desserts (honey buns). Try Splenda in coffee.   Personal Goal #2 Read labels for saturated fat, trans fat and sodium.   Personal Goal #3 Consider making smoothie with low fat milk, yogurt and fruit verses buying mix.     Intervention Plan   Intervention Prescribe, educate and counsel regarding individualized specific dietary modifications aiming towards targeted core components such as weight, hypertension, lipid management, diabetes, heart failure and other comorbidities.;Nutrition handout(s) given to patient.   Expected Outcomes Short Term Goal: Understand basic principles of dietary content, such as calories, fat, sodium, cholesterol and nutrients.      Nutrition Discharge: Rate Your Plate Scores:   Nutrition Goals Re-Evaluation:      Nutrition Goals Re-Evaluation    Row Name 02/20/16 1211             Personal Goal #1 Re-Evaluation   Goal Progress Seen Yes       Comments Trayquan realizes he is depressed and is eating alot.           Psychosocial: Target Goals: Acknowledge presence or absence of depression, maximize coping skills, provide positive support system. Participant is able to verbalize types and ability to use techniques and skills needed for reducing stress and depression.  Initial Review & Psychosocial Screening:     Initial Psych Review & Screening - 12/11/15 1539      Initial Review   Current issues with Current Stress Concerns     Family Dynamics   Good Support System? Yes     Screening Interventions   Interventions Encouraged to exercise;Program counselor consult      Quality of Life Scores:     Quality of Life - 12/13/15 1213      Quality of Life Scores   Health/Function Pre 14.2 %   Socioeconomic Pre 24.08 %   Psych/Spiritual Pre 20.57 %   Family Pre 26.4 %   GLOBAL Pre 19.2 %      PHQ-9: Recent Review Flowsheet Data    Depression screen [facility] 2/9 12/11/2015   Decreased Interest 3   Down, Depressed, Hopeless 3   PHQ - 2 Score 6   Altered sleeping 1   Tired, decreased energy 3   Change in appetite 0   Feeling bad or failure about yourself  2   Trouble concentrating 1   Moving slowly or fidgety/restless 1   Suicidal thoughts 0   PHQ-9 Score 14   Difficult doing work/chores Somewhat difficult      Psychosocial Evaluation and Intervention:     Psychosocial Evaluation - 03/03/16 0946      Psychosocial Evaluation & Interventions   Interventions Encouraged to exercise with the program and follow exercise prescription;Stress management education   Comments Counselor met with Mr. Shipes (Mr. R) today for initial psychosocial evaluation.  He is a 52 yr. Old gentleman who has (3) blockages that are being managed with medication and exercise currently.  Mr. Alfonso Patten has a wife  of 85 years and adult children as well as a faith community that provide him strong support.  He has multiple health issues; including high blood pressure; hepatitis C and HIV in addition  to his cardiac issues.  He sleeps well with the help of a valium nightly and reports having a good appetite.  Mr. Alfonso Patten denies a history of depression or anxiety but stated following his back surgery his Dr. put him on an SSRI since he was struggling some with his mood at the time.  He states this is helping currently.  Mr. Alfonso Patten had a PHQ-9 initial score of 14 and counselor reviewed this with him reporting the scores had decreased to an "8" at this time with this class and consistent exercise.  He reports having some family stress and financial problems, but they are "manageable."  He has goals to learn more about heart and healthier habits and plans to consistently exercise beyond this program by walking or working out at [employer] where his spouse is employed.        Psychosocial Re-Evaluation:     Psychosocial Re-Evaluation    Row Name 12/24/15 0959 02/20/16 1209           Psychosocial Re-Evaluation   Comments Isaak ststed that he is feeling much better since starting the progra. His mmod was low and has improved since starting profgram.  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  He meets the seniors as they arrive and takes time to talk and enjoy their company. Steling said he knows his weight is up since he feels he is depressed. I encouraged him to make an appt with his primary care MD to rule out anything. Then I encouraged him to get some talk therapy/counseling. He feels his exercise is going well.       Continued Psychosocial Services Needed  - Yes         Vocational Rehabilitation: Provide vocational rehab assistance to qualifying candidates.   Vocational Rehab Evaluation & Intervention:     Vocational Rehab - 12/11/15 1536      Initial Vocational  Rehab Evaluation & Intervention   Assessment shows need for Vocational Rehabilitation No      Education: Education Goals: Education classes will be provided on a weekly basis, covering required topics. Participant will state understanding/return demonstration of topics presented.  Learning Barriers/Preferences:     Learning Barriers/Preferences - 12/11/15 1536      Learning Barriers/Preferences   Learning Barriers None   Learning Preferences Computer/Internet;Individual Instruction;Group Instruction      Education Topics: General Nutrition Guidelines/Fats and Fiber: -Group instruction provided by verbal, written material, models and posters to present the general guidelines for heart healthy nutrition. Gives an explanation and review of dietary fats and fiber. Flowsheet Row Cardiac Rehab from 03/10/2016 in [facility] Cardiac and Pulmonary Rehab  Date  02/18/16  Educator  CR  Instruction Review Code  R- Review/reinforce [second class]      Controlling Sodium/Reading Food Labels: -Group verbal and written material supporting the discussion of sodium use in heart healthy nutrition. Review and explanation with models, verbal and written materials for utilization of the food label. Flowsheet Row Cardiac Rehab from 03/10/2016 in [facility] Cardiac and Pulmonary Rehab  Date  12/31/15  Educator  Erlene Quan  Instruction Review Code  2- meets goals/outcomes      Exercise Physiology & Risk Factors: - Group verbal and written instruction with models to review the exercise physiology of the cardiovascular system and associated critical values. Details cardiovascular disease risk factors and the goals associated with each risk factor. Flowsheet Row Cardiac Rehab from 03/10/2016 in [facility] Cardiac and  Pulmonary Rehab  Date  03/03/16  Educator  [facility]  Instruction Review Code  2- meets goals/outcomes      Aerobic Exercise & Resistance Training: - Gives group verbal and written discussion on the health  impact of inactivity. On the components of aerobic and resistive training programs and the benefits of this training and how to safely progress through these programs. Flowsheet Row Cardiac Rehab from 03/10/2016 in [facility] Cardiac and Pulmonary Rehab  Date  03/10/16  Educator  [facility]  Instruction Review Code  2- meets goals/outcomes      Flexibility, Balance, General Exercise Guidelines: - Provides group verbal and written instruction on the benefits of flexibility and balance training programs. Provides general exercise guidelines with specific guidelines to those with heart or lung disease. Demonstration and skill practice provided.   Stress Management: - Provides group verbal and written instruction about the health risks of elevated stress, cause of high stress, and healthy ways to reduce stress.   Depression: - Provides group verbal and written instruction on the correlation between heart/lung disease and depressed mood, treatment options, and the stigmas associated with seeking treatment. Flowsheet Row Cardiac Rehab from 03/10/2016 in [facility] Cardiac and Pulmonary Rehab  Date  02/20/16  Educator  C. EnterkinRN  Instruction Review Code  2- meets goals/outcomes      Anatomy & Physiology of the Heart: - Group verbal and written instruction and models provide basic cardiac anatomy and physiology, with the coronary electrical and arterial systems. Review of: AMI, Angina, Valve disease, Heart Failure, Cardiac Arrhythmia, Pacemakers, and the ICD. Flowsheet Row Cardiac Rehab from 03/10/2016 in [facility] Cardiac and Pulmonary Rehab  Date  01/21/16  Educator  SB  Instruction Review Code  2- meets goals/outcomes      Cardiac Procedures: - Group verbal and written instruction and models to describe the testing methods done to diagnose heart disease. Reviews the outcomes of the test results. Describes the treatment choices: Medical Management, Angioplasty, or Coronary Bypass Surgery. Flowsheet Row  Cardiac Rehab from 03/10/2016 in [facility] Cardiac and Pulmonary Rehab  Date  01/28/16  Educator  CE  Instruction Review Code  2- meets goals/outcomes      Cardiac Medications: - Group verbal and written instruction to review commonly prescribed medications for heart disease. Reviews the medication, class of the drug, and side effects. Includes the steps to properly store meds and maintain the prescription regimen. Flowsheet Row Cardiac Rehab from 03/10/2016 in [facility] Cardiac and Pulmonary Rehab  Date  12/17/15 [Second Part]  Educator  SB  Instruction Review Code  2- meets goals/outcomes      Go Sex-Intimacy & Heart Disease, Get SMART - Goal Setting: - Group verbal and written instruction through game format to discuss heart disease and the return to sexual intimacy. Provides group verbal and written material to discuss and apply goal setting through the application of the S.M.A.R.T. Method. Flowsheet Row Cardiac Rehab from 03/10/2016 in [facility] Cardiac and Pulmonary Rehab  Date  01/28/16  Educator  CE  Instruction Review Code  2- meets goals/outcomes      Other Matters of the Heart: - Provides group verbal, written materials and models to describe Heart Failure, Angina, Valve Disease, and Diabetes in the realm of heart disease. Includes description of the disease process and treatment options available to the cardiac patient. Flowsheet Row Cardiac Rehab from 03/10/2016 in [facility] Cardiac and Pulmonary Rehab  Date  01/21/16  Educator  SB  Instruction Review Code  2- meets goals/outcomes  Exercise & Equipment Safety: - Individual verbal instruction and demonstration of equipment use and safety with use of the equipment. Flowsheet Row Cardiac Rehab from 03/10/2016 in [facility] Cardiac and Pulmonary Rehab  Date  12/11/15  Educator  C. enterkinRN  Instruction Review Code  1- partially meets, needs review/practice      Infection Prevention: - Provides verbal and written material to  individual with discussion of infection control including proper hand washing and proper equipment cleaning during exercise session. Flowsheet Row Cardiac Rehab from 03/10/2016 in [facility] Cardiac and Pulmonary Rehab  Date  12/11/15  Educator  C. EnterkinRN  Instruction Review Code  2- meets goals/outcomes      Falls Prevention: - Provides verbal and written material to individual with discussion of falls prevention and safety. Flowsheet Row Cardiac Rehab from 03/10/2016 in [facility] Cardiac and Pulmonary Rehab  Date  12/11/15  Educator  C. Curry  Instruction Review Code  2- meets goals/outcomes      Diabetes: - Individual verbal and written instruction to review signs/symptoms of diabetes, desired ranges of glucose level fasting, after meals and with exercise. Advice that pre and post exercise glucose checks will be done for 3 sessions at entry of program.    Knowledge Questionnaire Score:     Knowledge Questionnaire Score - 12/12/15 1521      Knowledge Questionnaire Score   Pre Score 21      Core Components/Risk Factors/Patient Goals at Admission:     Personal Goals and Risk Factors at Admission - 12/11/15 1539      Core Components/Risk Factors/Patient Goals on Admission   Sedentary Yes   Intervention Provide advice, education, support and counseling about physical activity/exercise needs.;Develop an individualized exercise prescription for aerobic and resistive training based on initial evaluation findings, risk stratification, comorbidities and participant's personal goals.   Expected Outcomes Achievement of increased cardiorespiratory fitness and enhanced flexibility, muscular endurance and strength shown through measurements of functional capacity and personal statement of participant.   Hypertension Yes   Intervention Provide education on lifestyle modifcations including regular physical activity/exercise, weight management, moderate sodium restriction and increased  consumption of fresh fruit, vegetables, and low fat dairy, alcohol moderation, and smoking cessation.;Monitor prescription use compliance.   Expected Outcomes Short Term: Continued assessment and intervention until BP is < 140/10m HG in hypertensive participants. < 130/846mHG in hypertensive participants with diabetes, heart failure or chronic kidney disease.;Long Term: Maintenance of blood pressure at goal levels.   Stress Yes   Intervention Offer individual and/or small group education and counseling on adjustment to heart disease, stress management and health-related lifestyle change. Teach and support self-help strategies.;Refer participants experiencing significant psychosocial distress to appropriate mental health specialists for further evaluation and treatment. When possible, include family members and significant others in education/counseling sessions.   Expected Outcomes Short Term: Participant demonstrates changes in health-related behavior, relaxation and other stress management skills, ability to obtain effective social support, and compliance with psychotropic medications if prescribed.;Long Term: Emotional wellbeing is indicated by absence of clinically significant psychosocial distress or social isolation.      Core Components/Risk Factors/Patient Goals Review:      Goals and Risk Factor Review    Row Name 12/24/15 0955 01/28/16 1033 02/20/16 1205 02/20/16 1224       Core Components/Risk Factors/Patient Goals Review   Personal Goals Review Weight Management/Obesity;Hypertension;Stress Weight Management/Obesity;Sedentary;Increase Strength and Stamina;Heart Failure;Hypertension;Lipids;Stress  - Weight Management/Obesity;Sedentary;Increase Strength and Stamina;Heart Failure;Hypertension;Lipids;Stress    Review Spoke with StJacqueloday about risk factors  listed above. HE volunteered that he is eating less honey buns. His goal is to stop eating the honey buns and other sweets in order  to aid his weight loss goal. He had appointment this Friday and states is looking forward to talking with the RD.  He is eating less red meat, decreased the saturated fats and eating lots of vegetables. He has substituted yoguart for ice cream. Jakie continues to be compiant with his medications and has noticed an improvement with his BP since fgetting out of the hospital. He rarely uses salt. Zidan's weight was up again today.  He is planning to take extra Lasix today.  He has fluid built up in his belly.  He mentioned that he was feeling more depressed since less of his clothes are fitting.  We discussed keeping a food diary to track his eating to determine if where adjustments can be made since he thought he was eating bettter.  Based off our converstation, he is still eating a lot of salt.  His blood pressures have been good, and no problems with his statins.Birdena Crandall said he knows his weight is up since he feels he is depressed. I encouraged him to make an appt with his primary care MD to rule out anything. Then I encouraged him to get some talk therapy/counseling. He feels his exercise is going well.  Dvid's weight continues to go up; however, he has noticed that he has been snacking between meals and at night.  He is going to stop taking snacks to bed with him.  We also discussed increasing his home exercise to go up to 30-45 min versus 15-20 min.  He sees his cardiologist on 10/5 to talk about his fluid retention and weight gain.  He is beginning to just let his stress go.  His blood pressures have been good here in the program.    Expected Outcomes Continues to work on decreasing the amount of sweets he is eating every day to help decrease his weight. Continued management of his hypertension. Kien is going to start watching his diet better and exercise more at home to help with weight control and fluid. Healthier eating /healthier lifestyle.  He will continue to come to class for education  and exercise.  We will continue to encourage him on his exercise and diet changes.       Core Components/Risk Factors/Patient Goals at Discharge (Final Review):      Goals and Risk Factor Review - 02/20/16 1224      Core Components/Risk Factors/Patient Goals Review   Personal Goals Review Weight Management/Obesity;Sedentary;Increase Strength and Stamina;Heart Failure;Hypertension;Lipids;Stress   Review Elis's weight continues to go up; however, he has noticed that he has been snacking between meals and at night.  He is going to stop taking snacks to bed with him.  We also discussed increasing his home exercise to go up to 30-45 min versus 15-20 min.  He sees his cardiologist on 10/5 to talk about his fluid retention and weight gain.  He is beginning to just let his stress go.  His blood pressures have been good here in the program.   Expected Outcomes He will continue to come to class for education and exercise.  We will continue to encourage him on his exercise and diet changes.      ITP Comments:     ITP Comments    Row Name 12/17/15 0831 12/17/15 0957 12/18/15 0914 12/19/15 0953 12/31/15 1035   ITP Comments Today was  Neal's first full day.  We will continue to follow ITP. Rudell experienced 5/10 anginal symptoms.  Resolved with rest. Talking with Birdena Crandall, he states that he has had an increased use of his ntg over the past week. Swelling in Right leg greater than leg leg swelling. He was advised to call Dr Dennie Maizes office today to see MD as soon as possibel ,today or this week. He was also advised if symptoms increase he needs to gfo to ED if not able to see MD 30 day review. Continue with ITP Maddix has not been able to talk with doctor [place] his chest pain.  He was constantly put on hold and not able to talk with anyone.  He has not has any pain since Tuesday. Jolan had a 6 pound weight increase today.  He reported not taking his Lasix since last week due to it keeping  him up at night. He stated he had been taking his daily prescribed dose of Lasix at 9 p.m. or 10 p.m.  nightly.   Re-educated patient about better times to take Lasix 20 mg once a day as prescribed.  He plans to resume taking Lasix daily before 5 p.m. every day.     [place] Name 01/02/16 1022 01/15/16 0735 01/21/16 1036 02/05/16 1454 02/25/16 1054   ITP Comments Rodman's weight was down today 4 lbs.  He has started taking his lasix in the morning now. 30 day review. Continue with ITP unless changes noted by Medical Director at signature of review. Deundra returned today.  His weight is down to 212 today.  Exercised without complaints. Demarea has missed the last two appointments, he has been having increased swelling at home.  He contacted his physcian and they are adjusting his medications. Virgle has a family member(sister) in ICU. He may miss class if he needs to be there to talk to her doctors.   [place] Name 03/11/16 0639           ITP Comments 30 day review. Continue with ITP unless changes noted by Medical Director at signature of review.           Comments:

## 2016-03-12 DIAGNOSIS — I214 Non-ST elevation (NSTEMI) myocardial infarction: Secondary | ICD-10-CM | POA: Diagnosis not present

## 2016-03-12 NOTE — Progress Notes (Signed)
Daily Session Note  Patient Details  Name: Jared Walker MRN: [id number] Date of Birth: Jan 02, 1951 Referring Provider:   Flowsheet Row Cardiac Rehab from 12/11/2015 in [facility] Cardiac and Pulmonary Rehab  Referring Provider  Stann Mainland      Encounter Date: 03/12/2016  Check In:     Session Check In - 03/12/16 0848      Check-In   Location [facility]-Cardiac & Pulmonary Rehab   Staff Present Alberteen Sam, MA, ACSM RCEP, Exercise Physiologist;Dagen Beevers Oletta Darter, BA, ACSM CEP, Exercise Physiologist;Carroll Enterkin, RN, BSN   Supervising physician immediately available to respond to emergencies See telemetry face sheet for immediately available ER MD   Medication changes reported     No   Fall or balance concerns reported    No   Warm-up and Cool-down Performed on first and last piece of equipment   Resistance Training Performed Yes   VAD Patient? No     Pain Assessment   Currently in Pain? No/denies         Goals Met:  Independence with exercise equipment Exercise tolerated well No report of cardiac concerns or symptoms Strength training completed today  Goals Unmet:  Not Applicable  Comments: Pt able to follow exercise prescription today without complaint.  Will continue to monitor for progression.    Dr. Emily Filbert is Medical Director for [place] and [facility].

## 2016-03-17 ENCOUNTER — Encounter: Payer: Medicare Other | Admitting: *Deleted

## 2016-03-17 DIAGNOSIS — I214 Non-ST elevation (NSTEMI) myocardial infarction: Secondary | ICD-10-CM

## 2016-03-17 NOTE — Progress Notes (Signed)
Daily Session Note  Patient Details  Name: Jared Walker MRN: [id number] Date of Birth: Mar 26, 1951 Referring Provider:   Flowsheet Row Cardiac Rehab from 12/11/2015 in [facility] Cardiac and Pulmonary Rehab  Referring Provider  Stann Mainland      Encounter Date: 03/17/2016  Check In:     Session Check In - 03/17/16 0840      Check-In   Location [facility]-Cardiac & Pulmonary Rehab   Staff Present Alberteen Sam, MA, ACSM RCEP, Exercise Physiologist;Susanne Bice, RN, BSN, CCRP;Laureen Owens Shark, BS, RRT, Respiratory Therapist   Supervising physician immediately available to respond to emergencies See telemetry face sheet for immediately available ER MD   Medication changes reported     No   Fall or balance concerns reported    No   Warm-up and Cool-down Performed on first and last piece of equipment   Resistance Training Performed Yes   VAD Patient? No     Pain Assessment   Currently in Pain? No/denies   Multiple Pain Sites No         Goals Met:  Independence with exercise equipment Exercise tolerated well No report of cardiac concerns or symptoms Strength training completed today  Goals Unmet:  Not Applicable  Comments: Pt able to follow exercise prescription today without complaint.  Will continue to monitor for progression.    Dr. Emily Filbert is Medical Director for [place] and [facility].

## 2016-03-19 ENCOUNTER — Encounter: Payer: Medicare Other | Admitting: *Deleted

## 2016-03-19 VITALS — Ht 70.3 in | Wt 225.0 lb

## 2016-03-19 DIAGNOSIS — I214 Non-ST elevation (NSTEMI) myocardial infarction: Secondary | ICD-10-CM

## 2016-03-19 NOTE — Progress Notes (Signed)
Daily Session Note  Patient Details  Name: Jared Walker MRN: [id number] Date of Birth: 04-30-1951 Referring Provider:   Flowsheet Row Cardiac Rehab from 12/11/2015 in [facility] Cardiac and Pulmonary Rehab  Referring Provider  Stann Mainland      Encounter Date: 03/19/2016  Check In:     Session Check In - 03/19/16 0946      Check-In   Location [facility]-Cardiac & Pulmonary Rehab   Staff Present Alberteen Sam, MA, ACSM RCEP, Exercise Physiologist;Susanne Bice, RN, BSN, Lance Sell, BA, ACSM CEP, Exercise Physiologist   Supervising physician immediately available to respond to emergencies See telemetry face sheet for immediately available ER MD   Medication changes reported     No   Fall or balance concerns reported    No   Warm-up and Cool-down Performed on first and last piece of equipment   Resistance Training Performed Yes   VAD Patient? No     Pain Assessment   Currently in Pain? No/denies   Multiple Pain Sites No         Goals Met:  Independence with exercise equipment Exercise tolerated well Personal goals reviewed No report of cardiac concerns or symptoms Strength training completed today  Goals Unmet:  Not Applicable  Comments: Pt able to follow exercise prescription today without complaint.  Will continue to monitor for progression.    Dr. Emily Filbert is Medical Director for [place] and [facility].

## 2016-03-24 ENCOUNTER — Encounter: Payer: Medicare Other | Admitting: *Deleted

## 2016-03-24 DIAGNOSIS — I214 Non-ST elevation (NSTEMI) myocardial infarction: Secondary | ICD-10-CM | POA: Diagnosis not present

## 2016-03-24 NOTE — Progress Notes (Signed)
Daily Session Note  Patient Details  Name: Jared Walker MRN: [id number] Date of Birth: 02-21-1951 Referring Provider:   Flowsheet Row Cardiac Rehab from 12/11/2015 in [facility] Cardiac and Pulmonary Rehab  Referring Provider  Stann Mainland      Encounter Date: 03/24/2016  Check In:     Session Check In - 03/24/16 0848      Check-In   Location [facility]-Cardiac & Pulmonary Rehab   Staff Present Alberteen Sam, MA, ACSM RCEP, Exercise Physiologist;Susanne Bice, RN, BSN, CCRP;Laureen Owens Shark, BS, RRT, Respiratory Therapist;Other   Supervising physician immediately available to respond to emergencies See telemetry face sheet for immediately available ER MD   Medication changes reported     No   Fall or balance concerns reported    No   Warm-up and Cool-down Performed on first and last piece of equipment   Resistance Training Performed Yes   VAD Patient? No     Pain Assessment   Currently in Pain? No/denies   Multiple Pain Sites No         Goals Met:  Independence with exercise equipment Exercise tolerated well No report of cardiac concerns or symptoms Strength training completed today  Goals Unmet:  Not Applicable  Comments: Pt able to follow exercise prescription today without complaint.  Will continue to monitor for progression.    Dr. Emily Filbert is Medical Director for [place] and [facility].

## 2016-03-24 NOTE — Progress Notes (Signed)
Daily Session Note  Patient Details  Name: HENLEY BOETTNER MRN: [id number] Date of Birth: [birth year] Referring Provider:   Flowsheet Row Cardiac Rehab from 12/11/2015 in [facility] Cardiac and Pulmonary Rehab  Referring Provider  Stann Mainland      Encounter Date: 03/24/2016  Check In:     Session Check In - 03/24/16 0848      Check-In   Location [facility]-Cardiac & Pulmonary Rehab   Staff Present Alberteen Sam, MA, ACSM RCEP, Exercise Physiologist;Samiyah Stupka, RN, BSN, CCRP;Laureen Owens Shark, BS, RRT, Respiratory Therapist;Other   Supervising physician immediately available to respond to emergencies See telemetry face sheet for immediately available ER MD   Medication changes reported     No   Fall or balance concerns reported    No   Warm-up and Cool-down Performed on first and last piece of equipment   Resistance Training Performed Yes   VAD Patient? No     Pain Assessment   Currently in Pain? No/denies   Multiple Pain Sites No         Goals Met:  Independence with exercise equipment Exercise tolerated well No report of cardiac concerns or symptoms Strength training completed today  Goals Unmet:  Not Applicable  Comments: Doing well with exercise prescription progression.    Dr. Emily Filbert is Medical Director for [place] and [facility].

## 2016-03-25 NOTE — Patient Instructions (Signed)
Discharge Instructions  Patient Details  Name: Jared Walker MRN: [id number] Date of Birth: 1951/01/23 Referring Provider:  Kathrin Greathouse, MD   Number of Visits: 36  Reason for Discharge:  Patient reached a stable level of exercise. Patient independent in their exercise.  Smoking History:  History  Smoking Status  . Former Smoker  Smokeless Tobacco  . Former Systems developer  . Quit date: 03/13/1972    Diagnosis:  NSTEMI (non-ST elevated myocardial infarction) [facility])  Initial Exercise Prescription:     Initial Exercise Prescription - 12/11/15 1500      Date of Initial Exercise RX and Referring Provider   Date 12/11/15   Referring Provider Stann Mainland     Treadmill   MPH 2  only if it doesnt cause back pain   Grade 0   Minutes 15   METs 2.53     Recumbant Bike   Level 2   RPM 60   Watts 15   Minutes 15   METs 2.5     NuStep   Level 2   Watts 40   Minutes 15     Recumbant Elliptical   Level 2   RPM 60   Minutes 15   METs 2.5     REL-XR   Level 2   Minutes 15   METs 2.5     T5 Nustep   Level 2   Minutes 15   METs 2.5     Biostep-RELP   Level 2   Minutes 15   METs 2.5     Prescription Details   Frequency (times per week) 3   Duration Progress to 45 minutes of aerobic exercise without signs/symptoms of physical distress     Intensity   THRR 40-80% of Max Heartrate 98-137   Ratings of Perceived Exertion 11-13     Progression   Progression Continue to progress workloads to maintain intensity without signs/symptoms of physical distress.     Resistance Training   Training Prescription Yes   Weight 2   Reps 10-15      Discharge Exercise Prescription (Final Exercise Prescription Changes):     Exercise Prescription Changes - 03/17/16 1500      Exercise Review   Progression Yes     Response to Exercise   Blood Pressure (Admit) 132/64   Blood Pressure (Exercise) 136/80   Blood Pressure (Exit) 120/84   Heart Rate (Admit) 60 bpm   Heart  Rate (Exercise) 89 bpm   Heart Rate (Exit) 57 bpm   Rating of Perceived Exertion (Exercise) 11   Symptoms none   Comments Home Exercise Guidelines given 01/28/16   Duration Progress to 45 minutes of aerobic exercise without signs/symptoms of physical distress   Intensity THRR unchanged     Progression   Progression Continue to progress workloads to maintain intensity without signs/symptoms of physical distress.   Average METs 2.93     Resistance Training   Training Prescription Yes   Weight 4 lbs   Reps 10-15     Interval Training   Interval Training No     NuStep   Level 5   Watts --  2.4 METs   Minutes 15     REL-XR   Level 5   Minutes 15   METs 3.1     T5 Nustep   Level 5   Minutes 15   METs 2.5     Home Exercise Plan   Plans to continue exercise at [employer] (comment)  Walking  and [facility]   Frequency Add 3 additional days to program exercise sessions.      Functional Capacity:     6 Minute Walk    Row Name 12/11/15 1539 03/19/16 1022       6 Minute Walk   Phase  - Discharge    Distance 1174 feet 1600 feet    Distance % Change  - 36.3 %  426 ft    Walk Time 6 minutes 6 minutes    # of Rest Breaks 0 0    MPH 2.22 3.03    METS 2.84 3.93    RPE 14 14    VO2 Peak 9.95 13.77    Symptoms Yes (comment) Yes (comment)    Comments chronic back pain 8/10  -    Resting HR 60 bpm 62 bpm    Resting BP 132/80 150/90    Max Ex. HR 88 bpm 106 bpm    Max Ex. BP 132/72 170/74       Quality of Life:     Quality of Life - 03/19/16 1027      Quality of Life Scores   Health/Function Pre 14.2 %   Health/Function Post 26.04 %   Health/Function % Change 83.38 %   Socioeconomic Pre 24.08 %   Socioeconomic Post 27.75 %   Socioeconomic % Change  15.24 %   Psych/Spiritual Pre 20.57 %   Psych/Spiritual Post 30 %   Psych/Spiritual % Change 45.84 %   Family Pre 26.4 %   Family Post 28.8 %   Family % Change 9.09 %   GLOBAL Pre 19.2 %    GLOBAL Post 27.66 %   GLOBAL % Change 44.06 %      Personal Goals: Goals established at orientation with interventions provided to work toward goal.     Personal Goals and Risk Factors at Admission - 12/11/15 1539      Core Components/Risk Factors/Patient Goals on Admission   Sedentary Yes   Intervention Provide advice, education, support and counseling about physical activity/exercise needs.;Develop an individualized exercise prescription for aerobic and resistive training based on initial evaluation findings, risk stratification, comorbidities and participant's personal goals.   Expected Outcomes Achievement of increased cardiorespiratory fitness and enhanced flexibility, muscular endurance and strength shown through measurements of functional capacity and personal statement of participant.   Hypertension Yes   Intervention Provide education on lifestyle modifcations including regular physical activity/exercise, weight management, moderate sodium restriction and increased consumption of fresh fruit, vegetables, and low fat dairy, alcohol moderation, and smoking cessation.;Monitor prescription use compliance.   Expected Outcomes Short Term: Continued assessment and intervention until BP is < 140/60mm HG in hypertensive participants. < 130/6mm HG in hypertensive participants with diabetes, heart failure or chronic kidney disease.;Long Term: Maintenance of blood pressure at goal levels.   Stress Yes   Intervention Offer individual and/or small group education and counseling on adjustment to heart disease, stress management and health-related lifestyle change. Teach and support self-help strategies.;Refer participants experiencing significant psychosocial distress to appropriate mental health specialists for further evaluation and treatment. When possible, include family members and significant others in education/counseling sessions.   Expected Outcomes Short Term: Participant demonstrates  changes in health-related behavior, relaxation and other stress management skills, ability to obtain effective social support, and compliance with psychotropic medications if prescribed.;Long Term: Emotional wellbeing is indicated by absence of clinically significant psychosocial distress or social isolation.       Personal Goals Discharge:  Goals and Risk Factor Review - 03/19/16 0947      Core Components/Risk Factors/Patient Goals Review   Personal Goals Review Weight Management/Obesity;Sedentary;Increase Strength and Stamina;Lipids;Hypertension;Stress   Review Marquize's weight has been steadier.  He is starting to watch his diet more closely, but still putting sugar in his coffee.  He has been walking for 20 min every day, we talked about how this was not enough for what he is trying to accomplish.  His blood pressure was up today as he had not been taking his Lasix, he will take it when he gets home.  He is doing well with his statin..  Stress has started to improve.   Expected Outcomes Kaidon will continue to come to classes for education and exercie to work on risk factor modification.  We will continue to monitor his changes.      Nutrition & Weight - Outcomes:     Pre Biometrics - 12/11/15 1534      Pre Biometrics   Height 5' 10.3" (1.786 m)   Weight 210 lb 14.4 oz (95.7 kg)   Waist Circumference 43.75 inches   Hip Circumference 43 inches   Waist to Hip Ratio 1.02 %   BMI (Calculated) 30.1   Single Leg Stand 23 seconds         Post Biometrics - 03/19/16 1025       Post  Biometrics   Height 5' 10.3" (1.786 m)   Weight 225 lb (102.1 kg)   Waist Circumference 45 inches   Hip Circumference 43.25 inches   Waist to Hip Ratio 1.04 %   BMI (Calculated) 32.1      Nutrition:     Nutrition Therapy & Goals - 12/27/15 1144      Nutrition Therapy   Diet Instructed on heart healthy dietary guidelines including DASH diet principles.   Drug/Food Interactions  Statins/Certain Fruits   Protein (specify units) 7   Fiber 30 grams   Whole Grain Foods 3 servings   Saturated Fats 12 max. grams   Fruits and Vegetables 5 servings/day   Sodium 2000 grams     Personal Nutrition Goals   Personal Goal #1 Continue to decrease sugar in coffee and sweet desserts (honey buns). Try Splenda in coffee.   Personal Goal #2 Read labels for saturated fat, trans fat and sodium.   Personal Goal #3 Consider making smoothie with low fat milk, yogurt and fruit verses buying mix.     Intervention Plan   Intervention Prescribe, educate and counsel regarding individualized specific dietary modifications aiming towards targeted core components such as weight, hypertension, lipid management, diabetes, heart failure and other comorbidities.;Nutrition handout(s) given to patient.   Expected Outcomes Short Term Goal: Understand basic principles of dietary content, such as calories, fat, sodium, cholesterol and nutrients.      Nutrition Discharge:     Nutrition Assessments - 03/19/16 1026      Rate Your Plate Scores   Post Score 72   Post Score % 80 %      Education Questionnaire Score:     Knowledge Questionnaire Score - 03/19/16 1026      Knowledge Questionnaire Score   Pre Score 21/28   Post Score 20/28      Goals reviewed with patient; copy given to patient.

## 2016-03-26 ENCOUNTER — Encounter: Payer: Medicare Other | Admitting: *Deleted

## 2016-03-26 DIAGNOSIS — I214 Non-ST elevation (NSTEMI) myocardial infarction: Secondary | ICD-10-CM

## 2016-03-26 NOTE — Progress Notes (Addendum)
Cardiac Individual Treatment Plan  Patient Details  Name: Jared Walker MRN: [id number] Date of Birth: 08-Oct-1950 Referring Provider:  Dr. Megan Mans Row Cardiac Rehab from 12/11/2015 in [facility] Cardiac and Pulmonary Rehab  Referring Provider  Stann Mainland      Initial Encounter Date:  Flowsheet Row Cardiac Rehab from 12/11/2015 in [facility] Cardiac and Pulmonary Rehab  Date  12/11/15  Referring Provider  Stann Mainland      Visit Diagnosis: NSTEMI (non-ST elevated myocardial infarction) [facility])  Patient's Home Medications on Admission:  Current Outpatient Prescriptions:  .  allopurinol (ZYLOPRIM) 100 MG tablet, Take 200 mg by mouth daily. *Take along with 300 mg tablet to equal total dose of 500 mg daily.*, Disp: , Rfl:  .  allopurinol (ZYLOPRIM) 300 MG tablet, Take 300 mg by mouth See admin instructions. *Take along with 2 tablets of 100 mg (200 mg) to equal total dose of 500 mg by mouth daily*, Disp: , Rfl:  .  amLODipine (NORVASC) 10 MG tablet, Take 10 mg by mouth., Disp: , Rfl:  .  aspirin EC 81 MG EC tablet, Take 1 tablet (81 mg total) by mouth daily., Disp: 120 tablet, Rfl: 0 .  atenolol (TENORMIN) 100 MG tablet, Take 100 mg by mouth every evening. , Disp: , Rfl:  .  atorvastatin (LIPITOR) 10 MG tablet, Take 80 mg by mouth., Disp: , Rfl:  .  Cholecalciferol (VITAMIN D-1000 MAX ST) 1000 units tablet, Take 1,000 Units by mouth daily., Disp: , Rfl:  .  cloNIDine (CATAPRES) 0.1 MG tablet, Take 0.1 mg by mouth at bedtime. , Disp: , Rfl:  .  Cyanocobalamin (RA VITAMIN B-12 TR) 1000 MCG TBCR, Take 1,000 mcg by mouth daily., Disp: , Rfl:  .  DESCOVY 200-25 MG tablet, Take 1 tablet by mouth at bedtime. , Disp: , Rfl: 3 .  diazepam (VALIUM) 5 MG tablet, Take 5 mg by mouth at bedtime. For sleep, Disp: , Rfl:  .  docusate sodium (COLACE) 100 MG capsule, Take 100 mg by mouth at bedtime. , Disp: , Rfl:  .  dolutegravir (TIVICAY) 50 MG tablet, Take 50 mg by mouth at bedtime. , Disp: , Rfl:  .   esomeprazole (NEXIUM) 40 MG capsule, Take 1 capsule (40 mg total) by mouth 2 (two) times daily before a meal. AM (Patient taking differently: Take 40 mg by mouth every evening. AM), Disp: 60 capsule, Rfl: 3 .  ferrous sulfate 325 (65 FE) MG tablet, Take 325 mg by mouth 2 (two) times daily., Disp: , Rfl:  .  folic acid (FOLVITE) 1 MG tablet, Take 1 mg by mouth every evening. , Disp: , Rfl:  .  HYDROcodone-acetaminophen (NORCO/VICODIN) 5-325 MG tablet, Take 1 tablet by mouth See admin instructions. Take 1 tablet by mouth at bedtime if needed for pain, and Take 1 tablet by mouth 30 min before physical therapy., Disp: , Rfl: 0 .  losartan (COZAAR) 100 MG tablet, Take 100 mg by mouth every morning. , Disp: , Rfl: 0 .  nystatin (MYCOSTATIN) 100000 UNIT/ML suspension, Take 5 mLs (500,000 Units total) by mouth 4 (four) times daily. (Patient taking differently: Take 5 mLs by mouth 4 (four) times daily as needed. For throat pain.), Disp: 60 mL, Rfl: 0 .  omeprazole (PRILOSEC) 40 MG capsule, Take 40 mg by mouth every morning. , Disp: , Rfl: 0 .  sertraline (ZOLOFT) 50 MG tablet, Take 50 mg by mouth every evening. , Disp: , Rfl:   Past Medical History: Past Medical  History:  Diagnosis Date  . Anemia   . Aortic stenosis, mild    mild AS by echo, 08/2011  . Chronic kidney disease    kidney stones  . Coronary artery disease   . Depression   . Full dentures    upper and lower  . Gout   . Heart murmur   . Hep C w/o coma, chronic ([place]) 01/19/2015  . Hepatitis C 09/25/11  . HIV positive ([place]) 09/25/11  . Hypertension   . MI (myocardial infarction)     Tobacco Use: History  Smoking Status  . Former Smoker  Smokeless Tobacco  . Former Systems developer  . Quit date: 03/13/1972    Labs: Recent Review Flowsheet Data    Labs for ITP Cardiac and Pulmonary Rehab Latest Ref Rng & Units 11/13/2011 11/10/2015   Cholestrol 0 - 200 mg/dL 132 120   LDLCALC 0 - 99 mg/dL 71 69   HDL >40 mg/dL 36(L) 30(L)   Trlycerides  <150 mg/dL 124 107       Exercise Target Goals:    Exercise Program Goal: Individual exercise prescription set with THRR, safety & activity barriers. Participant demonstrates ability to understand and report RPE using BORG scale, to self-measure pulse accurately, and to acknowledge the importance of the exercise prescription.  Exercise Prescription Goal: Starting with aerobic activity 30 plus minutes a day, 3 days per week for initial exercise prescription. Provide home exercise prescription and guidelines that participant acknowledges understanding prior to discharge.  Activity Barriers & Risk Stratification:     Activity Barriers & Cardiac Risk Stratification - 12/11/15 1536      Activity Barriers & Cardiac Risk Stratification   Activity Barriers Deconditioning;Back Problems   Cardiac Risk Stratification High      6 Minute Walk:     6 Minute Walk    Row Name 12/11/15 1539 03/19/16 1022       6 Minute Walk   Phase  - Discharge    Distance 1174 feet 1600 feet    Distance % Change  - 36.3 %  426 ft    Walk Time 6 minutes 6 minutes    # of Rest Breaks 0 0    MPH 2.22 3.03    METS 2.84 3.93    RPE 14 14    VO2 Peak 9.95 13.77    Symptoms Yes (comment) Yes (comment)    Comments chronic back pain 8/10  -    Resting HR 60 bpm 62 bpm    Resting BP 132/80 150/90    Max Ex. HR 88 bpm 106 bpm    Max Ex. BP 132/72 170/74       Initial Exercise Prescription:     Initial Exercise Prescription - 12/11/15 1500      Date of Initial Exercise RX and Referring Provider   Date 12/11/15   Referring Provider Stann Mainland     Treadmill   MPH 2  only if it doesnt cause back pain   Grade 0   Minutes 15   METs 2.53     Recumbant Bike   Level 2   RPM 60   Watts 15   Minutes 15   METs 2.5     NuStep   Level 2   Watts 40   Minutes 15     Recumbant Elliptical   Level 2   RPM 60   Minutes 15   METs 2.5     REL-XR   Level 2   Minutes  15   METs 2.5     T5 Nustep    Level 2   Minutes 15   METs 2.5     Biostep-RELP   Level 2   Minutes 15   METs 2.5     Prescription Details   Frequency (times per week) 3   Duration Progress to 45 minutes of aerobic exercise without signs/symptoms of physical distress     Intensity   THRR 40-80% of Max Heartrate 98-137   Ratings of Perceived Exertion 11-13     Progression   Progression Continue to progress workloads to maintain intensity without signs/symptoms of physical distress.     Resistance Training   Training Prescription Yes   Weight 2   Reps 10-15      Perform Capillary Blood Glucose checks as needed.  Exercise Prescription Changes:      Exercise Prescription Changes    Row Name 12/25/15 1500 01/22/16 1500 01/28/16 1000 02/05/16 1400 02/19/16 1400     Exercise Review   Progression Yes Yes Yes Yes Yes     Response to Exercise   Blood Pressure (Admit) 126/70 132/64  - 138/72 112/64   Blood Pressure (Exercise) 154/78 144/74  - 124/84 126/70   Blood Pressure (Exit) 130/80 106/60  - 130/80 134/84   Heart Rate (Admit) 66 bpm 68 bpm  - 73 bpm 72 bpm   Heart Rate (Exercise) 95 bpm 85 bpm  - 85 bpm 101 bpm   Heart Rate (Exit) 58 bpm 61 bpm  - 60 bpm 62 bpm   Rating of Perceived Exertion (Exercise) 11 13  - 13 15   Symptoms none none none none none   Comments  -  - Home Exercise Guidelines given 01/28/16 Home Exercise Guidelines given 01/28/16 Home Exercise Guidelines given 01/28/16   Duration Progress to 45 minutes of aerobic exercise without signs/symptoms of physical distress Progress to 45 minutes of aerobic exercise without signs/symptoms of physical distress Progress to 45 minutes of aerobic exercise without signs/symptoms of physical distress Progress to 45 minutes of aerobic exercise without signs/symptoms of physical distress Progress to 45 minutes of aerobic exercise without signs/symptoms of physical distress   Intensity THRR unchanged THRR unchanged THRR unchanged THRR unchanged THRR  unchanged     Progression   Progression Continue to progress workloads to maintain intensity without signs/symptoms of physical distress. Continue to progress workloads to maintain intensity without signs/symptoms of physical distress. Continue to progress workloads to maintain intensity without signs/symptoms of physical distress. Continue to progress workloads to maintain intensity without signs/symptoms of physical distress. Continue to progress workloads to maintain intensity without signs/symptoms of physical distress.   Average METs 2.15 2.6 2.6 2.8 3.4     Resistance Training   Training Prescription Yes Yes Yes Yes Yes   Weight 2 3 lbs 3 lbs 3 lbs 6 lbs   Reps 10-15 10-15 10-15 10-15 10-15     Interval Training   Interval Training No No No No No     NuStep   Level  - '5 5 3 5   ' Watts  - -  2.8 METs -  2.8 METs -  2.1 METs -  3.6 METs   Minutes  - '15 15 15 15     ' REL-XR   Level '2 2 2 2 3   ' Minutes '15 15 15 15 15   ' METs 2.4 2.9 2.9 3.8 4.4     T5 Nustep   Level '2 2 2 2 ' 5  Minutes '15 15 15 15 15   ' METs 1.9 2.1 2.1 2 2.2     Home Exercise Plan   Plans to continue exercise at  -  - [employer] (comment)  Walking and [employer] (comment)  Walking and [employer] (comment)  Walking and [place] 3 additional days to program exercise sessions. Add 3 additional days to program exercise sessions. Add 3 additional days to program exercise sessions.   Row Name 03/05/16 1500 03/17/16 1500           Exercise Review   Progression Yes Yes        Response to Exercise   Blood Pressure (Admit) 138/80 132/64      Blood Pressure (Exercise) 134/60 136/80      Blood Pressure (Exit) 128/60 120/84      Heart Rate (Admit) 56 bpm 60 bpm      Heart Rate (Exercise) 89 bpm 89 bpm      Heart Rate (Exit) 57 bpm 57 bpm      Rating of Perceived Exertion (Exercise) 15 11      Symptoms none none       Comments Home Exercise Guidelines given 01/28/16 Home Exercise Guidelines given 01/28/16      Duration Progress to 45 minutes of aerobic exercise without signs/symptoms of physical distress Progress to 45 minutes of aerobic exercise without signs/symptoms of physical distress      Intensity THRR unchanged THRR unchanged        Progression   Progression Continue to progress workloads to maintain intensity without signs/symptoms of physical distress. Continue to progress workloads to maintain intensity without signs/symptoms of physical distress.      Average METs 2.7 2.93        Resistance Training   Training Prescription Yes Yes      Weight 6 lbs 4 lbs      Reps 10-15 10-15        Interval Training   Interval Training No No        NuStep   Level 5 5      Watts -  2.3 METs -  2.4 METs      Minutes 15 15        REL-XR   Level 5 5      Minutes 15 15      METs 3.1 3.1        T5 Nustep   Level  - 5      Minutes  - 15      METs  - 2.5        Home Exercise Plan   Plans to continue exercise at [employer] (comment)  Walking and [employer] (comment)  Walking and [facility]      Frequency Add 3 additional days to program exercise sessions. Add 3 additional days to program exercise sessions.         Exercise Comments:      Exercise Comments    Row Name 12/17/15 0831 12/17/15 0954 12/25/15 1535 01/22/16 1518 01/28/16 1040   Exercise Comments First full day of exercise!  Patient was oriented to gym and equipment including functions, settings, policies, and procedures.  Patient's individual exercise prescription and treatment plan were reviewed.  All starting workloads were established based on the results of the 6 minute walk test done at initial orientation visit.  The plan  for exercise progression was also introduced and progression will be customized based on patient's performance and goals. Jared Walker experienced 5/10 anginal symptoms.   Resolved with rest. Talking with Jared Walker, he states that he has had an increased use of his ntg over the past week. Swelling in Right leg greater than leg leg swelling. He was advised to call Dr Dennie Maizes office today to see MD as soon as possibel ,today or this week. He was also advised if symptoms increase he needs to gfo to ED if not able to see MD.  Jared Walker has been doing well with exercise.  He has not had any more recurring symptoms during exercise.  He has not tried to contact his doctor yet.  We will continue to monitor for progression. Jared Walker returned yesterday after being out since 8/3 for fluid build up.  He was able to return without any problems and even increased to level 3 on the T5. Reviewed home exercise with pt today.  Pt plans to walk at home and go to [facility] for exercise.  Reviewed THR, pulse, RPE, sign and symptoms, NTG use, and when to call 911 or MD.  Also discussed weather considerations and indoor options.  Pt voiced understanding.   Jared Walker Name 02/05/16 1454 02/19/16 1438 03/05/16 1530 03/17/16 1517 03/26/16 1020   Exercise Comments Jared Walker has been doing fairly well in exercise.  His weight is up again, he has contacted his doctor.  He hopes to return tomorrow.  We will continue to monitor for progression. Jared Walker is doing well with exercise.  His weight is still fluctuating.  He is exercising some at home.  We will continue to monitor for progression. Jared Walker continues to do well with exercise when he attends.  His attendance can fluctuate.  We will continue to monitor his progress. Jared Walker is doing well with exercise.  His weight has been steady over the last few visits which is an improvement.  He will be doing his post 6 min walk at his next visit.  We will continue to monitor his progression.  Jared Walker graduated today from cardiac rehab with 36 sessions completed.  Details of the patient's exercise prescription and what He needs to do in order to continue the  prescription and progress were discussed with patient.  Patient was given a copy of prescription and goals.  Patient verbalized understanding.  Jared Walker plans to continue to exercise by walking and going to [facility].      Discharge Exercise Prescription (Final Exercise Prescription Changes):     Exercise Prescription Changes - 03/17/16 1500      Exercise Review   Progression Yes     Response to Exercise   Blood Pressure (Admit) 132/64   Blood Pressure (Exercise) 136/80   Blood Pressure (Exit) 120/84   Heart Rate (Admit) 60 bpm   Heart Rate (Exercise) 89 bpm   Heart Rate (Exit) 57 bpm   Rating of Perceived Exertion (Exercise) 11   Symptoms none   Comments Home Exercise Guidelines given 01/28/16   Duration Progress to 45 minutes of aerobic exercise without signs/symptoms of physical distress   Intensity THRR unchanged     Progression   Progression Continue to progress workloads to maintain intensity without signs/symptoms of physical distress.   Average METs 2.93     Resistance Training   Training Prescription Yes   Weight 4 lbs   Reps 10-15     Interval Training   Interval Training No  NuStep   Level 5   Watts --  2.4 METs   Minutes 15     REL-XR   Level 5   Minutes 15   METs 3.1     T5 Nustep   Level 5   Minutes 15   METs 2.5     Home Exercise Plan   Plans to continue exercise at [employer] (comment)  Walking and [employer]   Frequency Add 3 additional days to program exercise sessions.      Nutrition:  Target Goals: Understanding of nutrition guidelines, daily intake of sodium <1582m, cholesterol <2076m calories 30% from fat and 7% or less from saturated fats, daily to have 5 or more servings of fruits and vegetables.  Biometrics:     Pre Biometrics - 12/11/15 1534      Pre Biometrics   Height 5' 10.3" (1.786 m)   Weight 210 lb 14.4 oz (95.7 kg)   Waist Circumference 43.75 inches   Hip Circumference 43 inches    Waist to Hip Ratio 1.02 %   BMI (Calculated) 30.1   Single Leg Stand 23 seconds         Post Biometrics - 03/19/16 1025       Post  Biometrics   Height 5' 10.3" (1.786 m)   Weight 225 lb (102.1 kg)   Waist Circumference 45 inches   Hip Circumference 43.25 inches   Waist to Hip Ratio 1.04 %   BMI (Calculated) 32.1      Nutrition Therapy Plan and Nutrition Goals:     Nutrition Therapy & Goals - 12/27/15 1144      Nutrition Therapy   Diet Instructed on heart healthy dietary guidelines including DASH diet principles.   Drug/Food Interactions Statins/Certain Fruits   Protein (specify units) 7   Fiber 30 grams   Whole Grain Foods 3 servings   Saturated Fats 12 max. grams   Fruits and Vegetables 5 servings/day   Sodium 2000 grams     Personal Nutrition Goals   Personal Goal #1 Continue to decrease sugar in coffee and sweet desserts (honey buns). Try Splenda in coffee.   Personal Goal #2 Read labels for saturated fat, trans fat and sodium.   Personal Goal #3 Consider making smoothie with low fat milk, yogurt and fruit verses buying mix.     Intervention Plan   Intervention Prescribe, educate and counsel regarding individualized specific dietary modifications aiming towards targeted core components such as weight, hypertension, lipid management, diabetes, heart failure and other comorbidities.;Nutrition handout(s) given to patient.   Expected Outcomes Short Term Goal: Understand basic principles of dietary content, such as calories, fat, sodium, cholesterol and nutrients.      Nutrition Discharge: Rate Your Plate Scores:     Nutrition Assessments - 03/19/16 1026      Rate Your Plate Scores   Post Score 72   Post Score % 80 %      Nutrition Goals Re-Evaluation:     Nutrition Goals Re-Evaluation    Row Name 02/20/16 1211 03/19/16 [id number]         Personal Goal #1 Re-Evaluation   Personal Goal #1  - Continue to decrease sugar in coffee and sweet desserts (honey  buns). Try Splenda in coffee.      Goal Progress Seen Yes Yes      Comments Jared he is depressed and is eating alot.  Nicklos is starting to cut back on the desserts.  He is still  putting 4 pack of sugar in his coffee.  He will try Splenda or [place].        Personal Goal #2 Re-Evaluation   Personal Goal #2  - Read labels for saturated fat, trans fat and sodium.      Goal Progress Seen  - Met      Comments  - He is consistently reading food labels to check for fat and sodium.        Personal Goal #3 Re-Evaluation   Personal Goal #3  - Consider making smoothie with low fat milk, yogurt and fruit verses buying mix.      Goal Progress Seen  - Yes      Comments  - He is making his smoothies now.  However, he has to remember that these are for meal replacements.        Weight   Current Weight  - 225 lb (102.1 kg)        Intervention Plan   Intervention  - Continue to educate, counsel and set short/long term goals regarding individualized specific personal dietary modifications.         Psychosocial: Target Goals: Acknowledge presence or absence of depression, maximize coping skills, provide positive support system. Participant is able to verbalize types and ability to use techniques and skills needed for reducing stress and depression.  Initial Review & Psychosocial Screening:     Initial Psych Review & Screening - 12/11/15 1539      Initial Review   Current issues with Current Stress Concerns     Family Dynamics   Good Support System? Yes     Screening Interventions   Interventions Encouraged to exercise;Program counselor consult      Quality of Life Scores:     Quality of Life - 03/19/16 1027      Quality of Life Scores   Health/Function Pre 14.2 %   Health/Function Post 26.04 %   Health/Function % Change 83.38 %   Socioeconomic Pre 24.08 %   Socioeconomic Post 27.75 %   Socioeconomic % Change  15.24 %   Psych/Spiritual Pre 20.57 %   Psych/Spiritual  Post 30 %   Psych/Spiritual % Change 45.84 %   Family Pre 26.4 %   Family Post 28.8 %   Family % Change 9.09 %   GLOBAL Pre 19.2 %   GLOBAL Post 27.66 %   GLOBAL % Change 44.06 %      PHQ-9: Recent Review Flowsheet Data    Depression screen [facility] 2/9 03/19/2016 12/11/2015   Decreased Interest 2 3   Down, Depressed, Hopeless 1 3   PHQ - 2 Score 3 6   Altered sleeping 0 1   Tired, decreased energy 1 3   Change in appetite 2 0   Feeling bad or failure about yourself  1 2   Trouble concentrating 0 1   Moving slowly or fidgety/restless 0 1   Suicidal thoughts 0 0   PHQ-9 Score 7 14   Difficult doing work/chores Somewhat difficult Somewhat difficult      Psychosocial Evaluation and Intervention:     Psychosocial Evaluation - 03/03/16 0946      Psychosocial Evaluation & Interventions   Interventions Encouraged to exercise with the program and follow exercise prescription;Stress management education   Comments Counselor met with Jared Walker (Jared Walker) today for initial psychosocial evaluation.  He is a 49 yr. Old gentleman who has (3) blockages that are being managed with medication and exercise currently.  Mr. Jared Walker has a wife of 3 years and adult children as well as a faith community that provide him strong support.  He has multiple health issues; including high blood pressure; hepatitis C and HIV in addition to his cardiac issues.  He sleeps well with the help of a valium nightly and reports having a good appetite.  Mr. Jared Walker denies a history of depression or anxiety but stated following his back surgery his Dr. put him on an SSRI since he was struggling some with his mood at the time.  He states this is helping currently.  Mr. Jared Walker had a PHQ-9 initial score of 14 and counselor reviewed this with him reporting the scores had decreased to an "8" at this time with this class and consistent exercise.  He reports having some family stress and financial problems, but they are "manageable."  He has goals to  learn more about heart and healthier habits and plans to consistently exercise beyond this program by walking or working out at [employer] where his spouse is employed.        Psychosocial Re-Evaluation:     Psychosocial Re-Evaluation    Row Name 12/24/15 [phone] 02/20/16 1209 03/17/16 0944         Psychosocial Re-Evaluation   Comments Jared Walker that he is feeling much better since starting the progra. His mmod was low and has improved since starting profgram.  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  He meets the seniors as they arrive and takes time to talk and enjoy their company. Steling said he knows his weight is up since he feels he is depressed. I encouraged him to make an appt with his primary care MD to rule out anything. Then I encouraged him to get some talk therapy/counseling. He feels his exercise is going well.  Counselor follow up with Jared Walker reporting he is scheduled to graduate from this program soon.  He has experienced multiple benefits including gained strength and stamina and has enjoyed the educational components as well.  Mr. Jared Walker states his mood is "stable" currently and that his medications are working well at this time.  He states his stress over his family has decreased and he is coping better as he exercises and relies on others to talk through these concerns.  He plans to continue exercising consistently and wants to keep his heart healthy with exercise and diet in the future.  Counselor commended Jared Walker for his hard work and progress made while in this class.       Continued Psychosocial Services Needed  - Yes  -        Vocational Rehabilitation: Provide vocational rehab assistance to qualifying candidates.   Vocational Rehab Evaluation & Intervention:     Vocational Rehab - 12/11/15 1536      Initial Vocational Rehab Evaluation & Intervention   Assessment shows need for Vocational Rehabilitation No       Education: Education Goals: Education classes will be provided on a weekly basis, covering required topics. Participant will state understanding/return demonstration of topics presented.  Learning Barriers/Preferences:     Learning Barriers/Preferences - 12/11/15 1536      Learning Barriers/Preferences   Learning Barriers None   Learning Preferences Computer/Internet;Individual Instruction;Group Instruction      Education Topics: General Nutrition Guidelines/Fats and Fiber: -Group instruction provided by verbal, written material, models and posters to present the general guidelines for heart healthy nutrition. Gives an  explanation and review of dietary fats and fiber. Flowsheet Row Cardiac Rehab from 03/26/2016 in [facility] Cardiac and Pulmonary Rehab  Date  02/18/16  Educator  CR  Instruction Review Code  Walker- Review/reinforce [second class]      Controlling Sodium/Reading Food Labels: -Group verbal and written material supporting the discussion of sodium use in heart healthy nutrition. Review and explanation with models, verbal and written materials for utilization of the food label. Flowsheet Row Cardiac Rehab from 03/26/2016 in [facility] Cardiac and Pulmonary Rehab  Date  12/31/15  Educator  Erlene Quan  Instruction Review Code  2- meets goals/outcomes      Exercise Physiology & Risk Factors: - Group verbal and written instruction with models to review the exercise physiology of the cardiovascular system and associated critical values. Details cardiovascular disease risk factors and the goals associated with each risk factor. Flowsheet Row Cardiac Rehab from 03/26/2016 in [facility] Cardiac and Pulmonary Rehab  Date  03/03/16  Educator  [facility]  Instruction Review Code  2- meets goals/outcomes      Aerobic Exercise & Resistance Training: - Gives group verbal and written discussion on the health impact of inactivity. On the components of aerobic and resistive training programs and the  benefits of this training and how to safely progress through these programs. Flowsheet Row Cardiac Rehab from 03/26/2016 in [facility] Cardiac and Pulmonary Rehab  Date  03/10/16  Educator  John Walker. [facility]  Instruction Review Code  2- meets goals/outcomes      Flexibility, Balance, General Exercise Guidelines: - Provides group verbal and written instruction on the benefits of flexibility and balance training programs. Provides general exercise guidelines with specific guidelines to those with heart or lung disease. Demonstration and skill practice provided.   Stress Management: - Provides group verbal and written instruction about the health risks of elevated stress, cause of high stress, and healthy ways to reduce stress. Flowsheet Row Cardiac Rehab from 03/26/2016 in [facility] Cardiac and Pulmonary Rehab  Date  03/19/16  Educator  SB  Instruction Review Code  2- meets goals/outcomes      Depression: - Provides group verbal and written instruction on the correlation between heart/lung disease and depressed mood, treatment options, and the stigmas associated with seeking treatment. Flowsheet Row Cardiac Rehab from 03/26/2016 in [facility] Cardiac and Pulmonary Rehab  Date  02/20/16  Educator  C. EnterkinRN  Instruction Review Code  2- meets goals/outcomes      Anatomy & Physiology of the Heart: - Group verbal and written instruction and models provide basic cardiac anatomy and physiology, with the coronary electrical and arterial systems. Review of: AMI, Angina, Valve disease, Heart Failure, Cardiac Arrhythmia, Pacemakers, and the ICD. Flowsheet Row Cardiac Rehab from 03/26/2016 in [facility] Cardiac and Pulmonary Rehab  Date  01/21/16  Educator  SB  Instruction Review Code  2- meets goals/outcomes      Cardiac Procedures: - Group verbal and written instruction and models to describe the testing methods done to diagnose heart disease. Reviews the outcomes of the test results. Describes the treatment choices:  Medical Management, Angioplasty, or Coronary Bypass Surgery. Flowsheet Row Cardiac Rehab from 03/26/2016 in [facility] Cardiac and Pulmonary Rehab  Date  03/24/16  Educator  SB  Instruction Review Code  Walker- Review/reinforce [Second class]      Cardiac Medications: - Group verbal and written instruction to review commonly prescribed medications for heart disease. Reviews the medication, class of the drug, and side effects. Includes the steps to properly store meds and maintain  the prescription regimen. Flowsheet Row Cardiac Rehab from 03/26/2016 in [facility] Cardiac and Pulmonary Rehab  Date  03/26/16 [Second Part]  Educator  C. EnterkinRN  Instruction Review Code  2- meets goals/outcomes      Go Sex-Intimacy & Heart Disease, Get SMART - Goal Setting: - Group verbal and written instruction through game format to discuss heart disease and the return to sexual intimacy. Provides group verbal and written material to discuss and apply goal setting through the application of the S.M.A.Walker.T. Method. Flowsheet Row Cardiac Rehab from 03/26/2016 in [facility] Cardiac and Pulmonary Rehab  Date  03/24/16  Educator  SB  Instruction Review Code  Walker- Review/reinforce [Second class]      Other Matters of the Heart: - Provides group verbal, written materials and models to describe Heart Failure, Angina, Valve Disease, and Diabetes in the realm of heart disease. Includes description of the disease process and treatment options available to the cardiac patient. Flowsheet Row Cardiac Rehab from 03/26/2016 in [facility] Cardiac and Pulmonary Rehab  Date  01/21/16  Educator  SB  Instruction Review Code  2- meets goals/outcomes      Exercise & Equipment Safety: - Individual verbal instruction and demonstration of equipment use and safety with use of the equipment. Flowsheet Row Cardiac Rehab from 03/26/2016 in [facility] Cardiac and Pulmonary Rehab  Date  12/11/15  Educator  C. enterkinRN  Instruction Review Code  1- partially  meets, needs review/practice      Infection Prevention: - Provides verbal and written material to individual with discussion of infection control including proper hand washing and proper equipment cleaning during exercise session. Flowsheet Row Cardiac Rehab from 03/26/2016 in [facility] Cardiac and Pulmonary Rehab  Date  12/11/15  Educator  C. EnterkinRN  Instruction Review Code  2- meets goals/outcomes      Falls Prevention: - Provides verbal and written material to individual with discussion of falls prevention and safety. Flowsheet Row Cardiac Rehab from 03/26/2016 in [facility] Cardiac and Pulmonary Rehab  Date  12/11/15  Educator  C. [place]  Instruction Review Code  2- meets goals/outcomes      Diabetes: - Individual verbal and written instruction to review signs/symptoms of diabetes, desired ranges of glucose level fasting, after meals and with exercise. Advice that pre and post exercise glucose checks will be done for 3 sessions at entry of program.    Knowledge Questionnaire Score:     Knowledge Questionnaire Score - 03/19/16 1026      Knowledge Questionnaire Score   Pre Score 21/28   Post Score 20/28      Core Components/Risk Factors/Patient Goals at Admission:     Personal Goals and Risk Factors at Admission - 12/11/15 1539      Core Components/Risk Factors/Patient Goals on Admission   Sedentary Yes   Intervention Provide advice, education, support and counseling about physical activity/exercise needs.;Develop an individualized exercise prescription for aerobic and resistive training based on initial evaluation findings, risk stratification, comorbidities and participant's personal goals.   Expected Outcomes Achievement of increased cardiorespiratory fitness and enhanced flexibility, muscular endurance and strength shown through measurements of functional capacity and personal statement of participant.   Hypertension Yes   Intervention Provide education on  lifestyle modifcations including regular physical activity/exercise, weight management, moderate sodium restriction and increased consumption of fresh fruit, vegetables, and low fat dairy, alcohol moderation, and smoking cessation.;Monitor prescription use compliance.   Expected Outcomes Short Term: Continued assessment and intervention until BP is < 140/65m HG in hypertensive participants. <  130/28m HG in hypertensive participants with diabetes, heart failure or chronic kidney disease.;Long Term: Maintenance of blood pressure at goal levels.   Stress Yes   Intervention Offer individual and/or small group education and counseling on adjustment to heart disease, stress management and health-related lifestyle change. Teach and support self-help strategies.;Refer participants experiencing significant psychosocial distress to appropriate mental health specialists for further evaluation and treatment. When possible, include family members and significant others in education/counseling sessions.   Expected Outcomes Short Term: Participant demonstrates changes in health-related behavior, relaxation and other stress management skills, ability to obtain effective social support, and compliance with psychotropic medications if prescribed.;Long Term: Emotional wellbeing is indicated by absence of clinically significant psychosocial distress or social isolation.      Core Components/Risk Factors/Patient Goals Review:      Goals and Risk Factor Review    Row Name 12/24/15 0955 01/28/16 1033 02/20/16 1205 02/20/16 1224 03/19/16 0947     Core Components/Risk Factors/Patient Goals Review   Personal Goals Review Weight Management/Obesity;Hypertension;Stress Weight Management/Obesity;Sedentary;Increase Strength and Stamina;Heart Failure;Hypertension;Lipids;Stress  - Weight Management/Obesity;Sedentary;Increase Strength and Stamina;Heart Failure;Hypertension;Lipids;Stress Weight Management/Obesity;Sedentary;Increase  Strength and Stamina;Lipids;Hypertension;Stress   Review Spoke with SWelcometoday about risk factors listed above. HE volunteered that he is eating less honey buns. His goal is to stop eating the honey buns and other sweets in order to aid his weight loss goal. He had appointment this Friday and states is looking forward to talking with the RD.  He is eating less red meat, decreased the saturated fats and eating lots of vegetables. He has substituted yoguart for ice cream. Benjimin continues to be compiant with his medications and has noticed an improvement with his BP since fgetting out of the hospital. He rarely uses salt. Waylon's weight was up again today.  He is planning to take extra Lasix today.  He has fluid built up in his belly.  He mentioned that he was feeling more depressed since less of his clothes are fitting.  We discussed keeping a food diary to track his eating to determine if where adjustments can be made since he thought he was eating bettter.  Based off our converstation, he is still eating a lot of salt.  His blood pressures have been good, and no problems with his statins..Jared Crandallsaid he knows his weight is up since he feels he is depressed. I encouraged him to make an appt with his primary care MD to rule out anything. Then I encouraged him to get some talk therapy/counseling. He feels his exercise is going well.  Atul's weight continues to go up; however, he has noticed that he has been snacking between meals and at night.  He is going to stop taking snacks to bed with him.  We also discussed increasing his home exercise to go up to 30-45 min versus 15-20 min.  He sees his cardiologist on 10/5 to talk about his fluid retention and weight gain.  He is beginning to just let his stress go.  His blood pressures have been good here in the program. Thanh's weight has been steadier.  He is starting to watch his diet more closely, but still putting sugar in his coffee.  He has been  walking for 20 min every day, we talked about how this was not enough for what he is trying to accomplish.  His blood pressure was up today as he had not been taking his Lasix, he will take it when he gets home.  He is doing  well with his statin..  Stress has started to improve.   Expected Outcomes Continues to work on decreasing the amount of sweets he is eating every day to help decrease his weight. Continued management of his hypertension. Noeh is going to start watching his diet better and exercise more at home to help with weight control and fluid. Healthier eating /healthier lifestyle.  He will continue to come to class for education and exercise.  We will continue to encourage him on his exercise and diet changes. Tyson will continue to come to classes for education and exercie to work on risk factor modification.  We will continue to monitor his changes.      Core Components/Risk Factors/Patient Goals at Discharge (Final Review):      Goals and Risk Factor Review - 03/19/16 0947      Core Components/Risk Factors/Patient Goals Review   Personal Goals Review Weight Management/Obesity;Sedentary;Increase Strength and Stamina;Lipids;Hypertension;Stress   Review Dreshaun's weight has been steadier.  He is starting to watch his diet more closely, but still putting sugar in his coffee.  He has been walking for 20 min every day, we talked about how this was not enough for what he is trying to accomplish.  His blood pressure was up today as he had not been taking his Lasix, he will take it when he gets home.  He is doing well with his statin..  Stress has started to improve.   Expected Outcomes Ioane will continue to come to classes for education and exercie to work on risk factor modification.  We will continue to monitor his changes.      ITP Comments:     ITP Comments    Row Name 12/17/15 0831 12/17/15 0957 12/18/15 0914 12/19/15 0953 12/31/15 1035   ITP Comments Today was Maxfield's  first full day.  We will continue to follow ITP. Dewaine experienced 5/10 anginal symptoms.  Resolved with rest. Talking with Jared Walker, he states that he has had an increased use of his ntg over the past week. Swelling in Right leg greater than leg leg swelling. He was advised to call Dr Dennie Maizes office today to see MD as soon as possibel ,today or this week. He was also advised if symptoms increase he needs to gfo to ED if not able to see MD 30 day review. Continue with ITP Gaither has not been able to talk with doctor Baraga his chest pain.  He was constantly put on hold and not able to talk with anyone.  He has not has any pain since Tuesday. Gurkaran had a 6 pound weight increase today.  He reported not taking his Lasix since last week due to it keeping him up at night. He stated he had been taking his daily prescribed dose of Lasix at 9 p.m. or 10 p.m.  nightly.   Re-educated patient about better times to take Lasix 20 mg once a day as prescribed.  He plans to resume taking Lasix daily before 5 p.m. every day.     [place] Name 01/02/16 1022 01/15/16 0735 01/21/16 1036 02/05/16 1454 02/25/16 1054   ITP Comments Deklyn's weight was down today 4 lbs.  He has started taking his lasix in the morning now. 30 day review. Continue with ITP unless changes noted by Medical Director at signature of review. Jowell returned today.  His weight is down to 212 today.  Exercised without complaints. Chevez has missed the last two appointments, he has been having increased swelling at home.  He contacted his physcian and they are adjusting his medications. Ivey has a family member(sister) in ICU. He may miss class if he needs to be there to talk to her doctors.   Ruby Name 03/11/16 [phone] 03/12/16 0849         ITP Comments 30 day review. Continue with ITP unless changes noted by Medical Director at signature of review.  Travian was in ER yesterday for elevated BP.  It was 152/80 before exercise today. He took  Clonidine yesterday but no changes to Rx meds were made.         Comments:

## 2016-03-26 NOTE — Progress Notes (Addendum)
Daily Session Note  Patient Details  Name: CRAIGE PATEL MRN: [id number] Date of Birth: 28-Jul-1950 Referring Provider:   Flowsheet Row Cardiac Rehab from 12/11/2015 in [facility] Cardiac and Pulmonary Rehab  Referring Provider  Stann Mainland      Encounter Date: 03/26/2016  Check In:     Session Check In - 03/26/16 0928      Check-In   Location [facility]-Cardiac & Pulmonary Rehab   Staff Present Gerlene Burdock, RN, BSN;Jessica Luan Pulling, MA, ACSM RCEP, Exercise Physiologist;Amanda Oletta Darter, BA, ACSM CEP, Exercise Physiologist   Supervising physician immediately available to respond to emergencies See telemetry face sheet for immediately available ER MD   Medication changes reported     No   Fall or balance concerns reported    No   Warm-up and Cool-down Performed on first and last piece of equipment   Resistance Training Performed Yes   VAD Patient? No     Pain Assessment   Currently in Pain? No/denies         Goals Met:  Proper associated with RPD/PD & O2 Sat Independence with exercise equipment Exercise tolerated well No report of cardiac concerns or symptoms Strength training completed today  Goals Unmet:  Not Applicable  Comments:  Pt able to follow exercise prescription today without complaint.  Will continue to monitor for progression.  Saharsh graduated today from cardiac rehab with 36 sessions completed.  Details of the patient's exercise prescription and what He needs to do in order to continue the prescription and progress were discussed with patient.  Patient was given a copy of prescription and goals.  Patient verbalized understanding.  Montarius plans to continue to exercise by walking and going to [facility].   Dr. Emily Filbert is Medical Director for [place] and [facility].

## 2016-03-26 NOTE — Progress Notes (Signed)
Discharge Summary  Patient Details  Name: Jared Walker MRN: [id number] Date of Birth: April 22, 1951 Referring Provider:  Dr. Megan Mans [facility] from 12/11/2015 in [facility]  Referring Provider  Stann Mainland       Number of Visits: 42  Reason for Discharge:  Patient reached a stable level of exercise. Patient independent in their exercise.  Smoking History:  History  Smoking Status  . Former Smoker  Smokeless Tobacco  . Former Systems developer  . Quit date: 03/13/1972    Diagnosis:  NSTEMI (non-ST elevated myocardial infarction) [facility])  ADL UCSD:   Initial Exercise Prescription:     Initial Exercise Prescription - 12/11/15 1500      Date of Initial Exercise RX and Referring Provider   Date 12/11/15   Referring Provider Stann Mainland     Treadmill   MPH 2  only if it doesnt cause back pain   Grade 0   Minutes 15   METs 2.53     Recumbant Bike   Level 2   RPM 60   Watts 15   Minutes 15   METs 2.5     NuStep   Level 2   Watts 40   Minutes 15     Recumbant Elliptical   Level 2   RPM 60   Minutes 15   METs 2.5     REL-XR   Level 2   Minutes 15   METs 2.5     T5 Nustep   Level 2   Minutes 15   METs 2.5     Biostep-RELP   Level 2   Minutes 15   METs 2.5     Prescription Details   Frequency (times per week) 3   Duration Progress to 45 minutes of aerobic exercise without signs/symptoms of physical distress     Intensity   THRR 40-80% of Max Heartrate 98-137   Ratings of Perceived Exertion 11-13     Progression   Progression Continue to progress workloads to maintain intensity without signs/symptoms of physical distress.     Resistance Training   Training Prescription Yes   Weight 2   Reps 10-15      Discharge Exercise Prescription (Final Exercise Prescription Changes):     Exercise Prescription Changes - 03/17/16 1500      Exercise Review   Progression Yes     Response to Exercise   Blood Pressure (Admit)  132/64   Blood Pressure (Exercise) 136/80   Blood Pressure (Exit) 120/84   Heart Rate (Admit) 60 bpm   Heart Rate (Exercise) 89 bpm   Heart Rate (Exit) 57 bpm   Rating of Perceived Exertion (Exercise) 11   Symptoms none   Comments Home Exercise Guidelines given 01/28/16   Duration Progress to 45 minutes of aerobic exercise without signs/symptoms of physical distress   Intensity THRR unchanged     Progression   Progression Continue to progress workloads to maintain intensity without signs/symptoms of physical distress.   Average METs 2.93     Resistance Training   Training Prescription Yes   Weight 4 lbs   Reps 10-15     Interval Training   Interval Training No     NuStep   Level 5   Watts --  2.4 METs   Minutes 15     REL-XR   Level 5   Minutes 15   METs 3.1     T5 Nustep   Level 5   Minutes 15  METs 2.5     Home Exercise Plan   Plans to continue exercise at [facility] (comment)  Walking and [facility]   Frequency Add 3 additional days to program exercise sessions.      Functional Capacity:     6 Minute Walk    Row Name 12/11/15 1539 03/19/16 1022       6 Minute Walk   Phase  - Discharge    Distance 1174 feet 1600 feet    Distance % Change  - 36.3 %  426 ft    Walk Time 6 minutes 6 minutes    # of Rest Breaks 0 0    MPH 2.22 3.03    METS 2.84 3.93    RPE 14 14    VO2 Peak 9.95 13.77    Symptoms Yes (comment) Yes (comment)    Comments chronic back pain 8/10  -    Resting HR 60 bpm 62 bpm    Resting BP 132/80 150/90    Max Ex. HR 88 bpm 106 bpm    Max Ex. BP 132/72 170/74       Psychological, QOL, Others - Outcomes: PHQ 2/9: Depression screen [facility] 2/9 03/19/2016 12/11/2015  Decreased Interest 2 3  Down, Depressed, Hopeless 1 3  PHQ - 2 Score 3 6  Altered sleeping 0 1  Tired, decreased energy 1 3  Change in appetite 2 0  Feeling bad or failure about yourself  1 2  Trouble concentrating 0 1  Moving slowly or  fidgety/restless 0 1  Suicidal thoughts 0 0  PHQ-9 Score 7 14  Difficult doing work/chores Somewhat difficult Somewhat difficult    Quality of Life:     Quality of Life - 03/19/16 1027      Quality of Life Scores   Health/Function Pre 14.2 %   Health/Function Post 26.04 %   Health/Function % Change 83.38 %   Socioeconomic Pre 24.08 %   Socioeconomic Post 27.75 %   Socioeconomic % Change  15.24 %   Psych/Spiritual Pre 20.57 %   Psych/Spiritual Post 30 %   Psych/Spiritual % Change 45.84 %   Family Pre 26.4 %   Family Post 28.8 %   Family % Change 9.09 %   GLOBAL Pre 19.2 %   GLOBAL Post 27.66 %   GLOBAL % Change 44.06 %      Personal Goals: Goals established at orientation with interventions provided to work toward goal.     Personal Goals and Risk Factors at Admission - 12/11/15 1539      Core Components/Risk Factors/Patient Goals on Admission   Sedentary Yes   Intervention Provide advice, education, support and counseling about physical activity/exercise needs.;Develop an individualized exercise prescription for aerobic and resistive training based on initial evaluation findings, risk stratification, comorbidities and participant's personal goals.   Expected Outcomes Achievement of increased cardiorespiratory fitness and enhanced flexibility, muscular endurance and strength shown through measurements of functional capacity and personal statement of participant.   Hypertension Yes   Intervention Provide education on lifestyle modifcations including regular physical activity/exercise, weight management, moderate sodium restriction and increased consumption of fresh fruit, vegetables, and low fat dairy, alcohol moderation, and smoking cessation.;Monitor prescription use compliance.   Expected Outcomes Short Term: Continued assessment and intervention until BP is < 140/30mm HG in hypertensive participants. < 130/14mm HG in hypertensive participants with diabetes, heart failure  or chronic kidney disease.;Long Term: Maintenance of blood pressure at goal levels.   Stress Yes  Intervention Offer individual and/or small group education and counseling on adjustment to heart disease, stress management and health-related lifestyle change. Teach and support self-help strategies.;Refer participants experiencing significant psychosocial distress to appropriate mental health specialists for further evaluation and treatment. When possible, include family members and significant others in education/counseling sessions.   Expected Outcomes Short Term: Participant demonstrates changes in health-related behavior, relaxation and other stress management skills, ability to obtain effective social support, and compliance with psychotropic medications if prescribed.;Long Term: Emotional wellbeing is indicated by absence of clinically significant psychosocial distress or social isolation.       Personal Goals Discharge:     Goals and Risk Factor Review    Row Name 12/24/15 0955 01/28/16 1033 02/20/16 1205 02/20/16 1224 03/19/16 0947     Core Components/Risk Factors/Patient Goals Review   Personal Goals Review Weight Management/Obesity;Hypertension;Stress Weight Management/Obesity;Sedentary;Increase Strength and Stamina;Heart Failure;Hypertension;Lipids;Stress  - Weight Management/Obesity;Sedentary;Increase Strength and Stamina;Heart Failure;Hypertension;Lipids;Stress Weight Management/Obesity;Sedentary;Increase Strength and Stamina;Lipids;Hypertension;Stress   Review Spoke with Asad today about risk factors listed above. HE volunteered that he is eating less honey buns. His goal is to stop eating the honey buns and other sweets in order to aid his weight loss goal. He had appointment this Friday and states is looking forward to talking with the RD.  He is eating less red meat, decreased the saturated fats and eating lots of vegetables. He has substituted yoguart for ice cream. Tamas  continues to be compiant with his medications and has noticed an improvement with his BP since fgetting out of the hospital. He rarely uses salt. Maleke's weight was up again today.  He is planning to take extra Lasix today.  He has fluid built up in his belly.  He mentioned that he was feeling more depressed since less of his clothes are fitting.  We discussed keeping a food diary to track his eating to determine if where adjustments can be made since he thought he was eating bettter.  Based off our converstation, he is still eating a lot of salt.  His blood pressures have been good, and no problems with his statins.Birdena Crandall said he knows his weight is up since he feels he is depressed. I encouraged him to make an appt with his primary care MD to rule out anything. Then I encouraged him to get some talk therapy/counseling. He feels his exercise is going well.  Jaun's weight continues to go up; however, he has noticed that he has been snacking between meals and at night.  He is going to stop taking snacks to bed with him.  We also discussed increasing his home exercise to go up to 30-45 min versus 15-20 min.  He sees his cardiologist on 10/5 to talk about his fluid retention and weight gain.  He is beginning to just let his stress go.  His blood pressures have been good here in the program. Windle's weight has been steadier.  He is starting to watch his diet more closely, but still putting sugar in his coffee.  He has been walking for 20 min every day, we talked about how this was not enough for what he is trying to accomplish.  His blood pressure was up today as he had not been taking his Lasix, he will take it when he gets home.  He is doing well with his statin..  Stress has started to improve.   Expected Outcomes Continues to work on decreasing the amount of sweets he is eating every day to help  decrease his weight. Continued management of his hypertension. Aries is going to start watching his diet  better and exercise more at home to help with weight control and fluid. Healthier eating /healthier lifestyle.  He will continue to come to class for education and exercise.  We will continue to encourage him on his exercise and diet changes. Izear will continue to come to classes for education and exercie to work on risk factor modification.  We will continue to monitor his changes.      Nutrition & Weight - Outcomes:     Pre Biometrics - 12/11/15 1534      Pre Biometrics   Height 5' 10.3" (1.786 m)   Weight 210 lb 14.4 oz (95.7 kg)   Waist Circumference 43.75 inches   Hip Circumference 43 inches   Waist to Hip Ratio 1.02 %   BMI (Calculated) 30.1   Single Leg Stand 23 seconds         Post Biometrics - 03/19/16 1025       Post  Biometrics   Height 5' 10.3" (1.786 m)   Weight 225 lb (102.1 kg)   Waist Circumference 45 inches   Hip Circumference 43.25 inches   Waist to Hip Ratio 1.04 %   BMI (Calculated) 32.1      Nutrition:     Nutrition Therapy & Goals - 12/27/15 1144      Nutrition Therapy   Diet Instructed on heart healthy dietary guidelines including DASH diet principles.   Drug/Food Interactions Statins/Certain Fruits   Protein (specify units) 7   Fiber 30 grams   Whole Grain Foods 3 servings   Saturated Fats 12 max. grams   Fruits and Vegetables 5 servings/day   Sodium 2000 grams     Personal Nutrition Goals   Personal Goal #1 Continue to decrease sugar in coffee and sweet desserts (honey buns). Try Splenda in coffee.   Personal Goal #2 Read labels for saturated fat, trans fat and sodium.   Personal Goal #3 Consider making smoothie with low fat milk, yogurt and fruit verses buying mix.     Intervention Plan   Intervention Prescribe, educate and counsel regarding individualized specific dietary modifications aiming towards targeted core components such as weight, hypertension, lipid management, diabetes, heart failure and other comorbidities.;Nutrition  handout(s) given to patient.   Expected Outcomes Short Term Goal: Understand basic principles of dietary content, such as calories, fat, sodium, cholesterol and nutrients.      Nutrition Discharge:     Nutrition Assessments - 03/19/16 1026      Rate Your Plate Scores   Post Score 72   Post Score % 80 %      Education Questionnaire Score:     Knowledge Questionnaire Score - 03/19/16 1026      Knowledge Questionnaire Score   Pre Score 21/28   Post Score 20/28      Goals reviewed with patient; copy given to patient.

## 2016-03-27 ENCOUNTER — Other Ambulatory Visit: Payer: Self-pay

## 2016-03-27 ENCOUNTER — Encounter: Payer: Self-pay | Admitting: *Deleted

## 2016-03-27 ENCOUNTER — Emergency Department: Payer: Medicare Other

## 2016-03-27 ENCOUNTER — Observation Stay
Admission: EM | Admit: 2016-03-27 | Discharge: 2016-03-28 | Disposition: A | Discharge status: Home / Self Care | Payer: Medicare Other | Attending: Internal Medicine | Admitting: Internal Medicine

## 2016-03-27 DIAGNOSIS — N183 Chronic kidney disease, stage 3 unspecified: Secondary | ICD-10-CM

## 2016-03-27 DIAGNOSIS — R079 Chest pain, unspecified: Secondary | ICD-10-CM | POA: Diagnosis present

## 2016-03-27 DIAGNOSIS — I5023 Acute on chronic systolic (congestive) heart failure: Secondary | ICD-10-CM

## 2016-03-27 DIAGNOSIS — I5043 Acute on chronic combined systolic (congestive) and diastolic (congestive) heart failure: Secondary | ICD-10-CM | POA: Diagnosis not present

## 2016-03-27 DIAGNOSIS — R0600 Dyspnea, unspecified: Secondary | ICD-10-CM

## 2016-03-27 DIAGNOSIS — B182 Chronic viral hepatitis C: Secondary | ICD-10-CM | POA: Insufficient documentation

## 2016-03-27 DIAGNOSIS — I2510 Atherosclerotic heart disease of native coronary artery without angina pectoris: Secondary | ICD-10-CM | POA: Diagnosis not present

## 2016-03-27 DIAGNOSIS — Z87442 Personal history of urinary calculi: Secondary | ICD-10-CM | POA: Diagnosis not present

## 2016-03-27 DIAGNOSIS — I700 Atherosclerosis of aorta: Secondary | ICD-10-CM | POA: Diagnosis not present

## 2016-03-27 DIAGNOSIS — Z21 Asymptomatic human immunodeficiency virus [HIV] infection status: Secondary | ICD-10-CM | POA: Diagnosis not present

## 2016-03-27 DIAGNOSIS — F329 Major depressive disorder, single episode, unspecified: Secondary | ICD-10-CM | POA: Diagnosis not present

## 2016-03-27 DIAGNOSIS — R011 Cardiac murmur, unspecified: Secondary | ICD-10-CM | POA: Insufficient documentation

## 2016-03-27 DIAGNOSIS — Z87891 Personal history of nicotine dependence: Secondary | ICD-10-CM | POA: Insufficient documentation

## 2016-03-27 DIAGNOSIS — R188 Other ascites: Secondary | ICD-10-CM

## 2016-03-27 DIAGNOSIS — Z809 Family history of malignant neoplasm, unspecified: Secondary | ICD-10-CM | POA: Diagnosis not present

## 2016-03-27 DIAGNOSIS — R7989 Other specified abnormal findings of blood chemistry: Secondary | ICD-10-CM

## 2016-03-27 DIAGNOSIS — I252 Old myocardial infarction: Secondary | ICD-10-CM | POA: Insufficient documentation

## 2016-03-27 DIAGNOSIS — R0789 Other chest pain: Secondary | ICD-10-CM | POA: Diagnosis not present

## 2016-03-27 DIAGNOSIS — I5033 Acute on chronic diastolic (congestive) heart failure: Secondary | ICD-10-CM

## 2016-03-27 DIAGNOSIS — Z885 Allergy status to narcotic agent status: Secondary | ICD-10-CM | POA: Insufficient documentation

## 2016-03-27 DIAGNOSIS — Z833 Family history of diabetes mellitus: Secondary | ICD-10-CM | POA: Insufficient documentation

## 2016-03-27 DIAGNOSIS — Z7902 Long term (current) use of antithrombotics/antiplatelets: Secondary | ICD-10-CM | POA: Insufficient documentation

## 2016-03-27 DIAGNOSIS — K7460 Unspecified cirrhosis of liver: Secondary | ICD-10-CM | POA: Insufficient documentation

## 2016-03-27 DIAGNOSIS — Z888 Allergy status to other drugs, medicaments and biological substances status: Secondary | ICD-10-CM | POA: Insufficient documentation

## 2016-03-27 DIAGNOSIS — M109 Gout, unspecified: Secondary | ICD-10-CM | POA: Diagnosis not present

## 2016-03-27 DIAGNOSIS — Z8249 Family history of ischemic heart disease and other diseases of the circulatory system: Secondary | ICD-10-CM | POA: Diagnosis not present

## 2016-03-27 DIAGNOSIS — D500 Iron deficiency anemia secondary to blood loss (chronic): Secondary | ICD-10-CM | POA: Insufficient documentation

## 2016-03-27 DIAGNOSIS — M7989 Other specified soft tissue disorders: Secondary | ICD-10-CM

## 2016-03-27 DIAGNOSIS — Z7982 Long term (current) use of aspirin: Secondary | ICD-10-CM | POA: Insufficient documentation

## 2016-03-27 DIAGNOSIS — I130 Hypertensive heart and chronic kidney disease with heart failure and stage 1 through stage 4 chronic kidney disease, or unspecified chronic kidney disease: Secondary | ICD-10-CM | POA: Diagnosis not present

## 2016-03-27 DIAGNOSIS — B20 Human immunodeficiency virus [HIV] disease: Secondary | ICD-10-CM

## 2016-03-27 DIAGNOSIS — R778 Other specified abnormalities of plasma proteins: Secondary | ICD-10-CM

## 2016-03-27 LAB — CBC
HCT: 40.8 % (ref 40.0–52.0)
HEMOGLOBIN: 13.5 g/dL (ref 13.0–18.0)
MCH: 31.4 pg (ref 26.0–34.0)
MCHC: 33.2 g/dL (ref 32.0–36.0)
MCV: 94.7 fL (ref 80.0–100.0)
PLATELETS: 114 10*3/uL — AB (ref 150–440)
RBC: 4.31 MIL/uL — AB (ref 4.40–5.90)
RDW: 13.3 % (ref 11.5–14.5)
WBC: 6.4 10*3/uL (ref 3.8–10.6)

## 2016-03-27 LAB — BASIC METABOLIC PANEL
ANION GAP: 8 (ref 5–15)
BUN: 29 mg/dL — ABNORMAL HIGH (ref 6–20)
CALCIUM: 9.2 mg/dL (ref 8.9–10.3)
CO2: 27 mmol/L (ref 22–32)
CREATININE: 2.35 mg/dL — AB (ref 0.61–1.24)
Chloride: 103 mmol/L (ref 101–111)
GFR, EST AFRICAN AMERICAN: 32 mL/min — AB (ref >60)
GFR, EST NON AFRICAN AMERICAN: 28 mL/min — AB (ref >60)
GLUCOSE: 298 mg/dL — AB (ref 65–99)
Potassium: 4.4 mmol/L (ref 3.5–5.1)
Sodium: 138 mmol/L (ref 135–145)

## 2016-03-27 LAB — TROPONIN I: TROPONIN I: 0.03 ng/mL — AB (ref <0.03)

## 2016-03-27 LAB — GLUCOSE, CAPILLARY: Glucose-Capillary: 138 mg/dL — ABNORMAL HIGH (ref 65–99)

## 2016-03-27 MED ORDER — ONDANSETRON HCL 4 MG/2ML IJ SOLN: 4.0000 mg | Freq: Four times a day (QID) | INTRAMUSCULAR | Status: DC | PRN

## 2016-03-27 MED ORDER — DIAZEPAM 5 MG PO TABS
5.0000 mg | ORAL_TABLET | Freq: Every day | ORAL | Status: DC
Start: 2016-03-27 — End: 2016-03-28
  Administered 2016-03-27: 5 mg via ORAL
  Filled 2016-03-27: qty 1

## 2016-03-27 MED ORDER — HYDROCODONE-ACETAMINOPHEN 5-325 MG PO TABS
1.0000 | ORAL_TABLET | Freq: Every evening | ORAL | Status: DC | PRN
  Administered 2016-03-27: 1 via ORAL
  Filled 2016-03-27: qty 1

## 2016-03-27 MED ORDER — HYDROCODONE-ACETAMINOPHEN 5-325 MG PO TABS: 1.0000 | ORAL_TABLET | ORAL | Status: DC

## 2016-03-27 MED ORDER — NITROGLYCERIN 0.4 MG SL SUBL
SUBLINGUAL_TABLET | SUBLINGUAL | Status: AC
  Administered 2016-03-27: 0.4 mg via SUBLINGUAL
  Filled 2016-03-27: qty 1

## 2016-03-27 MED ORDER — SODIUM CHLORIDE 0.9% FLUSH
3.0000 mL | Freq: Two times a day (BID) | INTRAVENOUS | Status: DC
  Administered 2016-03-28: 3 mL via INTRAVENOUS

## 2016-03-27 MED ORDER — FOLIC ACID 1 MG PO TABS
1.0000 mg | ORAL_TABLET | Freq: Every evening | ORAL | Status: DC
  Administered 2016-03-27: 1 mg via ORAL
  Filled 2016-03-27 (×2): qty 1

## 2016-03-27 MED ORDER — ASPIRIN EC 81 MG PO TBEC
81.0000 mg | DELAYED_RELEASE_TABLET | Freq: Every day | ORAL | Status: DC
  Administered 2016-03-28: 81 mg via ORAL
  Filled 2016-03-27: qty 1

## 2016-03-27 MED ORDER — INSULIN ASPART 100 UNIT/ML ~~LOC~~ SOLN: 0.0000 [IU] | Freq: Three times a day (TID) | SUBCUTANEOUS | Status: DC

## 2016-03-27 MED ORDER — HEPARIN SODIUM (PORCINE) 5000 UNIT/ML IJ SOLN
5000.0000 [IU] | Freq: Three times a day (TID) | INTRAMUSCULAR | Status: DC
  Administered 2016-03-27 – 2016-03-28 (×2): 5000 [IU] via SUBCUTANEOUS
  Filled 2016-03-27 (×3): qty 1

## 2016-03-27 MED ORDER — NITROGLYCERIN 0.4 MG SL SUBL
0.4000 mg | SUBLINGUAL_TABLET | SUBLINGUAL | Status: DC | PRN
  Administered 2016-03-27: 0.4 mg via SUBLINGUAL

## 2016-03-27 MED ORDER — EMTRICITABINE-TENOFOVIR AF 200-25 MG PO TABS
1.0000 | ORAL_TABLET | Freq: Every day | ORAL | Status: DC
  Administered 2016-03-27: 1 via ORAL
  Filled 2016-03-27 (×2): qty 1

## 2016-03-27 MED ORDER — FUROSEMIDE 10 MG/ML IJ SOLN
40.0000 mg | Freq: Two times a day (BID) | INTRAMUSCULAR | Status: DC
  Administered 2016-03-27 – 2016-03-28 (×2): 40 mg via INTRAVENOUS
  Filled 2016-03-27 (×2): qty 4

## 2016-03-27 MED ORDER — ACETAMINOPHEN 325 MG PO TABS: 650.0000 mg | ORAL_TABLET | Freq: Four times a day (QID) | ORAL | Status: DC | PRN

## 2016-03-27 MED ORDER — PANTOPRAZOLE SODIUM 40 MG PO TBEC
40.0000 mg | DELAYED_RELEASE_TABLET | Freq: Every day | ORAL | Status: DC
  Administered 2016-03-28: 40 mg via ORAL
  Filled 2016-03-27: qty 1

## 2016-03-27 MED ORDER — VITAMIN D 1000 UNITS PO TABS
1000.0000 [IU] | ORAL_TABLET | Freq: Every day | ORAL | Status: DC
  Administered 2016-03-28: 1000 [IU] via ORAL
  Filled 2016-03-27: qty 1

## 2016-03-27 MED ORDER — ONDANSETRON HCL 4 MG/2ML IJ SOLN
INTRAMUSCULAR | Status: AC
  Administered 2016-03-27: 4 mg via INTRAVENOUS
  Filled 2016-03-27: qty 2

## 2016-03-27 MED ORDER — ISOSORBIDE MONONITRATE ER 30 MG PO TB24
30.0000 mg | ORAL_TABLET | Freq: Every day | ORAL | Status: DC
  Administered 2016-03-28: 30 mg via ORAL
  Filled 2016-03-27 (×2): qty 1

## 2016-03-27 MED ORDER — ONDANSETRON HCL 4 MG/2ML IJ SOLN
4.0000 mg | Freq: Once | INTRAMUSCULAR | Status: AC
  Administered 2016-03-27: 4 mg via INTRAVENOUS

## 2016-03-27 MED ORDER — DOCUSATE SODIUM 100 MG PO CAPS
100.0000 mg | ORAL_CAPSULE | Freq: Every day | ORAL | Status: DC
  Administered 2016-03-27: 100 mg via ORAL
  Filled 2016-03-27: qty 1

## 2016-03-27 MED ORDER — CLOPIDOGREL BISULFATE 75 MG PO TABS
75.0000 mg | ORAL_TABLET | Freq: Every day | ORAL | Status: DC
  Administered 2016-03-28: 75 mg via ORAL
  Filled 2016-03-27 (×2): qty 1

## 2016-03-27 MED ORDER — MORPHINE SULFATE (PF) 2 MG/ML IV SOLN
INTRAVENOUS | Status: AC
  Administered 2016-03-27: 2 mg via INTRAVENOUS
  Filled 2016-03-27: qty 1

## 2016-03-27 MED ORDER — HYDROCODONE-ACETAMINOPHEN 5-325 MG PO TABS
1.0000 | ORAL_TABLET | ORAL | Status: DC | PRN
Start: 2016-03-28 — End: 2016-03-28
  Administered 2016-03-28: 1 via ORAL
  Filled 2016-03-27: qty 1

## 2016-03-27 MED ORDER — FERROUS SULFATE 325 (65 FE) MG PO TABS
325.0000 mg | ORAL_TABLET | Freq: Two times a day (BID) | ORAL | Status: DC
  Administered 2016-03-27: 325 mg via ORAL
  Filled 2016-03-27: qty 1

## 2016-03-27 MED ORDER — ONDANSETRON HCL 4 MG PO TABS: 4.0000 mg | ORAL_TABLET | Freq: Four times a day (QID) | ORAL | Status: DC | PRN

## 2016-03-27 MED ORDER — MORPHINE SULFATE (PF) 2 MG/ML IV SOLN
2.0000 mg | Freq: Once | INTRAVENOUS | Status: AC
  Administered 2016-03-27: 2 mg via INTRAVENOUS

## 2016-03-27 MED ORDER — DOLUTEGRAVIR SODIUM 50 MG PO TABS
50.0000 mg | ORAL_TABLET | Freq: Every day | ORAL | Status: DC
  Administered 2016-03-27: 50 mg via ORAL
  Filled 2016-03-27: qty 1

## 2016-03-27 MED ORDER — INSULIN ASPART 100 UNIT/ML ~~LOC~~ SOLN: 0.0000 [IU] | Freq: Every day | SUBCUTANEOUS | Status: DC

## 2016-03-27 MED ORDER — SERTRALINE HCL 50 MG PO TABS
50.0000 mg | ORAL_TABLET | Freq: Every evening | ORAL | Status: DC
  Administered 2016-03-27: 50 mg via ORAL
  Filled 2016-03-27: qty 1

## 2016-03-27 MED ORDER — VITAMIN B-12 1000 MCG PO TABS
1000.0000 ug | ORAL_TABLET | Freq: Every day | ORAL | Status: DC
  Administered 2016-03-28: 1000 ug via ORAL
  Filled 2016-03-27: qty 1

## 2016-03-27 MED ORDER — ACETAMINOPHEN 650 MG RE SUPP
650.0000 mg | Freq: Four times a day (QID) | RECTAL | Status: DC | PRN
Start: 2016-03-27 — End: 2016-03-28

## 2016-03-27 MED ORDER — ALLOPURINOL 100 MG PO TABS
500.0000 mg | ORAL_TABLET | Freq: Every day | ORAL | Status: DC
  Administered 2016-03-28: 500 mg via ORAL
  Filled 2016-03-27: qty 5

## 2016-03-27 MED ORDER — ALLOPURINOL 100 MG PO TABS: 200.0000 mg | ORAL_TABLET | Freq: Every day | ORAL | Status: DC

## 2016-03-27 MED ORDER — CARVEDILOL 12.5 MG PO TABS
12.5000 mg | ORAL_TABLET | Freq: Two times a day (BID) | ORAL | Status: DC
  Administered 2016-03-28: 12.5 mg via ORAL
  Filled 2016-03-27: qty 1

## 2016-03-27 NOTE — ED Notes (Signed)
Pt states no pain relief from nitro tablet, EDP informed and verbal orders received for morphine and zofran

## 2016-03-27 NOTE — ED Notes (Signed)
Patient transported to X-ray 

## 2016-03-27 NOTE — H&P (Addendum)
[place] [facility] [place] NAME: Jared Walker    MR#:  [id number]  DATE OF BIRTH:  10/11/1950  DATE OF ADMISSION:  03/27/2016  PRIMARY CARE PHYSICIAN: Tracie Harrier, MD   REQUESTING/REFERRING PHYSICIAN: Dr. Lavonia Drafts  CHIEF COMPLAINT:   Chief Complaint  Patient presents with  . Shortness of Breath  . Chest Pain    HISTORY OF PRESENT ILLNESS:  Jared Walker  is a 65 y.o. male with a known history of CAD status post recent cardiac catheterization in June 2017 at [facility] with 90% blockage of juice marginal on medical management, congestive heart failure, HIV on HAART, hepatitis C, CK D, hypertension presents to the hospital secondary to chest pain. Patient continues to go to cardiac rehabilitation and has been working on his diet and exercise. Has occasional tingly chest pain for which his medications have been recently adjusted by his cardiologist. He is on aspirin, Plavix, Imdur and Coreg. Today he was resting, suddenly developed tightness in his chest going down his left arm, was diaphoretic nauseous had difficulty breathing. He also felt that his lower extremity swelling and his abdominal swelling has worsened. Denies eating any extra salt or increase fluid intake. His sats are fine here on room air, troponin is normal at 0.03. Chest pain has relieved with morphine. He is being admitted for further management of his edema and chest pain. No other fevers, chills or diarrhea or vomiting or other symptoms.  PAST MEDICAL HISTORY:   Past Medical History:  Diagnosis Date  . Anemia   . Aortic stenosis, mild    mild AS by echo, 08/2011  . Chronic kidney disease    kidney stones  . Coronary artery disease   . Depression   . Full dentures    upper and lower  . Gout   . Heart murmur   . Hep C w/o coma, chronic ([place]) 01/19/2015  . Hepatitis C 09/25/11  . HIV positive ([place]) 09/25/11  . Hypertension   . MI (myocardial infarction)      PAST SURGICAL HISTORY:   Past Surgical History:  Procedure Laterality Date  . BACK SURGERY    . CARDIOVASCULAR STRESS TEST     at [facility]  . CHOLECYSTECTOMY    . CLOSED REDUCTION PELVIC FRACTURE    . ESOPHAGOGASTRODUODENOSCOPY (EGD) WITH PROPOFOL N/A 01/21/2015   Procedure: ESOPHAGOGASTRODUODENOSCOPY (EGD) WITH PROPOFOL;  Surgeon: Manya Silvas, MD;  Location[facility] ENDOSCOPY;  Service: Endoscopy;  Laterality: N/A;  . ESOPHAGOGASTRODUODENOSCOPY (EGD) WITH PROPOFOL N/A 09/04/2015   Procedure: ESOPHAGOGASTRODUODENOSCOPY (EGD) WITH PROPOFOL;  Surgeon: Manya Silvas, MD;  Location: [facility] ENDOSCOPY;  Service: Endoscopy;  Laterality: N/A;  . FRACTURE SURGERY    . KNEE ARTHROSCOPY  2009   Right  . LUMBAR LAMINECTOMY/DECOMPRESSION MICRODISCECTOMY  09/25/2011   Procedure: LUMBAR LAMINECTOMY/DECOMPRESSION MICRODISCECTOMY;  Surgeon: Erline Levine, MD;  Location: [facility] NEURO ORS;  Service: Neurosurgery;  Laterality: Left;  Left Lumbar Four-Five Microdiskectomy  . NEPHROSTOMY     tube Left  . SHOULDER ARTHROSCOPY WITH OPEN ROTATOR CUFF REPAIR Right 10/12/2014   Procedure: SHOULDER ARTHROSCOP with decompression;  Surgeon: Leanor Kail, MD;  Location: [place];  Service: Orthopedics;  Laterality: Right;    SOCIAL HISTORY:   Social History  Substance Use Topics  . Smoking status: Former Research scientist (life sciences)  . Smokeless tobacco: Former Systems developer    Quit date: 03/13/1972  . Alcohol use No    FAMILY HISTORY:   Family History  Problem Relation Age  of Onset  . Hypertension Mother   . Cancer Father   . Heart disease Sister   . Diabetes Brother   . Anesthesia problems Neg Hx     DRUG ALLERGIES:   Allergies  Allergen Reactions  . Buchu-Cornsilk-Ch Grass-Hydran Other (See Comments)    Gout.  . Colchicine Diarrhea    "Have diarrhea when taken for long periods of time"  . Nifedipine Other (See Comments)    "Unknown"  . Oxycodone-Acetaminophen Other (See Comments)    GI upset.  . Tramadol  Other (See Comments)    "unknown"    REVIEW OF SYSTEMS:   Review of Systems  Constitutional: Positive for malaise/fatigue. Negative for chills, fever and weight loss.  HENT: Negative for ear discharge, ear pain, hearing loss and nosebleeds.   Eyes: Negative for blurred vision, double vision and photophobia.  Respiratory: Positive for shortness of breath. Negative for cough, hemoptysis and wheezing.   Cardiovascular: Positive for chest pain and leg swelling. Negative for palpitations and orthopnea.  Gastrointestinal: Positive for nausea. Negative for abdominal pain, constipation, diarrhea, heartburn, melena and vomiting.       Abdominal distention present  Genitourinary: Negative for dysuria, frequency and urgency.  Musculoskeletal: Positive for back pain. Negative for myalgias and neck pain.  Skin: Negative for rash.  Neurological: Negative for dizziness, tingling, tremors, sensory change, speech change, focal weakness and headaches.  Endo/Heme/Allergies: Does not bruise/bleed easily.  Psychiatric/Behavioral: Negative for depression.    MEDICATIONS AT HOME:   Prior to Admission medications   Medication Sig Start Date End Date Taking? Authorizing Provider  allopurinol (ZYLOPRIM) 100 MG tablet Take 200 mg by mouth daily. *Take along with 300 mg tablet to equal total dose of 500 mg daily.*   Yes Historical Provider, MD  allopurinol (ZYLOPRIM) 300 MG tablet Take 300 mg by mouth See admin instructions. *Take along with 2 tablets of 100 mg (200 mg) to equal total dose of 500 mg by mouth daily*   Yes Historical Provider, MD  amLODipine (NORVASC) 10 MG tablet Take 10 mg by mouth. 11/13/15 11/12/16 Yes Historical Provider, MD  aspirin EC 81 MG EC tablet Take 1 tablet (81 mg total) by mouth daily. 11/11/15  Yes Sital Mody, MD  atenolol (TENORMIN) 100 MG tablet Take 100 mg by mouth daily.    Yes Historical Provider, MD  Cholecalciferol (VITAMIN D-1000 MAX ST) 1000 units tablet Take 1,000 Units by  mouth daily.   Yes Historical Provider, MD  DESCOVY 200-25 MG tablet Take 1 tablet by mouth at bedtime.  08/12/15  Yes Historical Provider, MD  diazepam (VALIUM) 5 MG tablet Take 5 mg by mouth at bedtime. For sleep   Yes Historical Provider, MD  docusate sodium (COLACE) 100 MG capsule Take 100 mg by mouth at bedtime.  05/24/14  Yes Historical Provider, MD  dolutegravir (TIVICAY) 50 MG tablet Take 50 mg by mouth at bedtime.  07/31/15  Yes Historical Provider, MD  folic acid (FOLVITE) 1 MG tablet Take 1 mg by mouth every evening.    Yes Historical Provider, MD  HYDROcodone-acetaminophen (NORCO/VICODIN) 5-325 MG tablet Take 1 tablet by mouth See admin instructions. Take 1 tablet by mouth at bedtime if needed for pain, and Take 1 tablet by mouth 30 min before physical therapy. 10/18/15  Yes Historical Provider, MD  omeprazole (PRILOSEC) 40 MG capsule Take 40 mg by mouth every morning.  11/06/15  Yes Historical Provider, MD  sertraline (ZOLOFT) 50 MG tablet Take 50 mg by mouth every  evening.    Yes Historical Provider, MD  vitamin B-12 (CYANOCOBALAMIN) 1000 MCG tablet Take 1,000 mcg by mouth daily.   Yes Historical Provider, MD  atorvastatin (LIPITOR) 10 MG tablet Take 80 mg by mouth. 11/13/15 12/13/15  Historical Provider, MD  ferrous sulfate 325 (65 FE) MG tablet Take 325 mg by mouth 2 (two) times daily. 01/10/15 01/10/16  Historical Provider, MD  losartan (COZAAR) 100 MG tablet Take 100 mg by mouth every morning.  10/31/15   Historical Provider, MD      VITAL SIGNS:  Blood pressure (!) 142/87, pulse 76, temperature 98.4 F (36.9 C), temperature source Oral, resp. rate 20, height 5\' 9"  (1.753 m), weight 99.8 kg (220 lb), SpO2 97 %.  PHYSICAL EXAMINATION:   Physical Exam  GENERAL:  65 y.o.-year-old patient lying in the bed with no acute distress.  EYES: Pupils equal, round, reactive to light and accommodation. No scleral icterus. Extraocular muscles intact.  HEENT: Head atraumatic, normocephalic.  Oropharynx and nasopharynx clear.  NECK:  Supple, no jugular venous distention. No thyroid enlargement, no tenderness.  LUNGS: Normal breath sounds bilaterally, no wheezing, rales,rhonchi or crepitation. No use of accessory muscles of respiration. Decreased bibasilar breath sounds CARDIOVASCULAR: S1, S2 normal. No murmurs, rubs, or gallops.  ABDOMEN: Soft, nontender, Abdomen is distended and nontender.. Bowel sounds present. No organomegaly or mass.  EXTREMITIES: No pedal edema, cyanosis, or clubbing.  NEUROLOGIC: Cranial nerves II through XII are intact. Muscle strength 5/5 in all extremities. Sensation intact. Gait not checked.  PSYCHIATRIC: The patient is alert and oriented x 3.  SKIN: No obvious rash, lesion, or ulcer. Ecchymosis noted on his abdomen  LABORATORY PANEL:   CBC  Recent Labs Lab 03/27/16 1731  WBC 6.4  HGB 13.5  HCT 40.8  PLT 114*   ------------------------------------------------------------------------------------------------------------------  Chemistries   Recent Labs Lab 03/27/16 1731  NA 138  K 4.4  CL 103  CO2 27  GLUCOSE 298*  BUN 29*  CREATININE 2.35*  CALCIUM 9.2   ------------------------------------------------------------------------------------------------------------------  Cardiac Enzymes  Recent Labs Lab 03/27/16 1731  TROPONINI 0.03*   ------------------------------------------------------------------------------------------------------------------  RADIOLOGY:  Dg Chest 2 View  Result Date: 03/27/2016 CLINICAL DATA:  Lethargy and chest pain on the left. EXAM: CHEST  2 VIEW COMPARISON:  01/07/2016 FINDINGS: The lungs are clear wiithout focal pneumonia, edema, pneumothorax or pleural effusion. The cardiopericardial silhouette is within normal limits for size. The visualized bony structures of the thorax are intact. Telemetry leads overlie the chest. IMPRESSION: No active cardiopulmonary disease. Electronically Signed   By: Misty Stanley M.D.   On: 03/27/2016 18:02    EKG:   Orders placed or performed during the hospital encounter of 03/27/16  . ED EKG within 10 minutes  . ED EKG within 10 minutes    IMPRESSION AND PLAN:   Jared Walker  is a 65 y.o. male with a known history of CAD status post recent cardiac catheterization in June 2017 at [facility] with 90% blockage of juice marginal on medical management, congestive heart failure, HIV on HAART, hepatitis C, CKD, hypertension presents to the hospital secondary to chest pain.  #1 chest pain-unstable angina. Cardiac catheterization in June 2017 with blockages but on medical management. -Admit to telemetry, recycle troponins. Cardiology consulted. -Echocardiogram ordered. -Continue Imdur, Coreg, aspirin and Plavix and statin.  #2 systolic CHF-with the mild exacerbation-started on Lasix twice a day IV. Follow-up echocardiogram. -Monitor potassium. Also has CK D, continue to monitor his creatinine  #3 cirrhosis with hepatitis C-follows  up with [facility] ID. About to start treatment for his hepatitis C. Also EGD scheduled to look for esophageal varices. -Has significant abdominal distention. Ultrasound ordered for ascites.  #4 HIV on HAART-latest viral load undetectable. Continue his HAART medications. Follows up with [facility] ID.  #5 hypertension-currently on Coreg. Home medications need to be verified. Also on Imdur  #6 DVT prophylaxis-on subcutaneous heparin  #7 Hyperglycemia- no diagnosis of DM, check hba1c and start SSI    All the records are reviewed and case discussed with ED provider. Management plans discussed with the patient, family and they are in agreement.  CODE STATUS:  Full Code  TOTAL TIME TAKING CARE OF THIS PATIENT: 50 minutes.    Gladstone Lighter M.D on 03/27/2016 at 8:01 PM  Between 7am to 6pm - Pager - [phone]  After 6pm go to [email or web address] - password [email or web address] [place] [facility]  Office  [phone]  CC: Primary care  physician; Tracie Harrier, MD

## 2016-03-27 NOTE — ED Triage Notes (Signed)
Pt to triage via wheelchair.  Pt reports sob and chest pain since yesterday.  Pt took ntg this morning without relief.  Pt reports  Tightness in chest and pain in left arm.  Pt had episode of nausea.  Pt alert.  speech clear

## 2016-03-27 NOTE — ED Provider Notes (Signed)
[facility] Emergency Department Provider Note   ____________________________________________    I have reviewed the triage vital signs and the nursing notes.   HISTORY  Chief Complaint Shortness of Breath and Chest Pain     HPI Jared Walker is a 65 y.o. male who presents with complaints of chest pain and mild shortness of breath. Patient reports he has a history of heart disease and over the last several days he has had intermittent chest discomfort seemed to worsen today. It does seem tied to exertion. He reports his breathing feels "off". He denies diaphoresis. He did have some radiation to his left arm. He took nitroglycerin with some improvement. His cardiologist is Dr. Matthew Saras at [facility]   Past Medical History:  Diagnosis Date  . Anemia   . Aortic stenosis, mild    mild AS by echo, 08/2011  . Chronic kidney disease    kidney stones  . Coronary artery disease   . Depression   . Full dentures    upper and lower  . Gout   . Heart murmur   . Hep C w/o coma, chronic ([place]) 01/19/2015  . Hepatitis C 09/25/11  . HIV positive ([place]) 09/25/11  . Hypertension   . MI (myocardial infarction)     Patient Active Problem List   Diagnosis Date Noted  . BP (high blood pressure) 12/11/2015  . Acute non-ST elevation myocardial infarction (NSTEMI) ([place]) 11/13/2015  . Atypical chest pain 11/09/2015  . Bradycardia 11/09/2015  . Abnormal EKG 11/09/2015  . Iron deficiency anemia 08/27/2015  . Chest pain 01/19/2015  . Anemia 01/19/2015  . GI bleed 01/19/2015  . Pica in adults 01/19/2015  . CKD (chronic kidney disease), stage III 01/19/2015  . HIV (human immunodeficiency virus infection) ([place]) 01/19/2015  . HTN (hypertension) 01/19/2015  . Hep C w/o coma, chronic ([place]) 01/19/2015  . CKD (chronic kidney disease) 01/19/2015  . History of arthroscopy of shoulder 11/13/2014    Past Surgical History:  Procedure Laterality Date  . BACK SURGERY    .  CARDIOVASCULAR STRESS TEST     at [facility]  . CHOLECYSTECTOMY    . CLOSED REDUCTION PELVIC FRACTURE    . ESOPHAGOGASTRODUODENOSCOPY (EGD) WITH PROPOFOL N/A 01/21/2015   Procedure: ESOPHAGOGASTRODUODENOSCOPY (EGD) WITH PROPOFOL;  Surgeon: Manya Silvas, MD;  Location: [facility] ENDOSCOPY;  Service: Endoscopy;  Laterality: N/A;  . ESOPHAGOGASTRODUODENOSCOPY (EGD) WITH PROPOFOL N/A 09/04/2015   Procedure: ESOPHAGOGASTRODUODENOSCOPY (EGD) WITH PROPOFOL;  Surgeon: Manya Silvas, MD;  Location: [facility] ENDOSCOPY;  Service: Endoscopy;  Laterality: N/A;  . FRACTURE SURGERY    . KNEE ARTHROSCOPY  2009   Right  . LUMBAR LAMINECTOMY/DECOMPRESSION MICRODISCECTOMY  09/25/2011   Procedure: LUMBAR LAMINECTOMY/DECOMPRESSION MICRODISCECTOMY;  Surgeon: Erline Levine, MD;  Location: [place] NEURO ORS;  Service: Neurosurgery;  Laterality: Left;  Left Lumbar Four-Five Microdiskectomy  . NEPHROSTOMY     tube Left  . SHOULDER ARTHROSCOPY WITH OPEN ROTATOR CUFF REPAIR Right 10/12/2014   Procedure: SHOULDER ARTHROSCOP with decompression;  Surgeon: Leanor Kail, MD;  Location: [place];  Service: Orthopedics;  Laterality: Right;    Prior to Admission medications   Medication Sig Start Date End Date Taking? Authorizing Provider  allopurinol (ZYLOPRIM) 100 MG tablet Take 200 mg by mouth daily. *Take along with 300 mg tablet to equal total dose of 500 mg daily.*   Yes Historical Provider, MD  allopurinol (ZYLOPRIM) 300 MG tablet Take 300 mg by mouth See admin instructions. *Take along with 2  tablets of 100 mg (200 mg) to equal total dose of 500 mg by mouth daily*   Yes Historical Provider, MD  amLODipine (NORVASC) 10 MG tablet Take 10 mg by mouth. 11/13/15 11/12/16 Yes Historical Provider, MD  aspirin EC 81 MG EC tablet Take 1 tablet (81 mg total) by mouth daily. 11/11/15  Yes Sital Mody, MD  atenolol (TENORMIN) 100 MG tablet Take 100 mg by mouth daily.    Yes Historical Provider, MD  Cholecalciferol (VITAMIN D-1000 MAX ST)  1000 units tablet Take 1,000 Units by mouth daily.   Yes Historical Provider, MD  omeprazole (PRILOSEC) 40 MG capsule Take 40 mg by mouth every morning.  11/06/15  Yes Historical Provider, MD  atorvastatin (LIPITOR) 10 MG tablet Take 80 mg by mouth. 11/13/15 12/13/15  Historical Provider, MD  DESCOVY 200-25 MG tablet Take 1 tablet by mouth at bedtime.  08/12/15   Historical Provider, MD  diazepam (VALIUM) 5 MG tablet Take 5 mg by mouth at bedtime. For sleep    Historical Provider, MD  docusate sodium (COLACE) 100 MG capsule Take 100 mg by mouth at bedtime.  05/24/14   Historical Provider, MD  dolutegravir (TIVICAY) 50 MG tablet Take 50 mg by mouth at bedtime.  07/31/15   Historical Provider, MD  ferrous sulfate 325 (65 FE) MG tablet Take 325 mg by mouth 2 (two) times daily. 01/10/15 01/10/16  Historical Provider, MD  folic acid (FOLVITE) 1 MG tablet Take 1 mg by mouth every evening.     Historical Provider, MD  HYDROcodone-acetaminophen (NORCO/VICODIN) 5-325 MG tablet Take 1 tablet by mouth See admin instructions. Take 1 tablet by mouth at bedtime if needed for pain, and Take 1 tablet by mouth 30 min before physical therapy. 10/18/15   Historical Provider, MD  losartan (COZAAR) 100 MG tablet Take 100 mg by mouth every morning.  10/31/15   Historical Provider, MD  sertraline (ZOLOFT) 50 MG tablet Take 50 mg by mouth every evening.     Historical Provider, MD     Allergies Buchu-cornsilk-ch grass-hydran; Colchicine; Nifedipine; Oxycodone-acetaminophen; and Tramadol  Family History  Problem Relation Age of Onset  . Hypertension Mother   . Cancer Father   . Heart disease Sister   . Diabetes Brother   . Anesthesia problems Neg Hx     Social History Social History  Substance Use Topics  . Smoking status: Former Research scientist (life sciences)  . Smokeless tobacco: Former Systems developer    Quit date: 03/13/1972  . Alcohol use No    Review of Systems  Constitutional: No fever/chills Eyes: No visual changes.  ENT: No sore  throat. Cardiovascular:As above Respiratory: As above, no pleurisy Gastrointestinal: No abdominal pain.  No nausea, no vomiting.   Genitourinary: Negative for dysuria. Musculoskeletal: Negative for back pain. Skin: Negative for rash. Neurological: Negative for headaches  10-point ROS otherwise negative.  ____________________________________________   PHYSICAL EXAM:  VITAL SIGNS: ED Triage Vitals  Enc Vitals Group     BP 03/27/16 1725 (!) 142/87     Pulse Rate 03/27/16 1725 76     Resp 03/27/16 1725 20     Temp 03/27/16 1725 98.4 F (36.9 C)     Temp Source 03/27/16 1725 Oral     SpO2 03/27/16 1725 97 %     Weight 03/27/16 1725 220 lb (99.8 kg)     Height 03/27/16 1725 5\' 9"  (1.753 m)     Head Circumference --      Peak Flow --  Pain Score 03/27/16 1729 8     Pain Loc --      Pain Edu? --      Excl. in [place]? --     Constitutional: Alert and oriented. No acute distress. Pleasant and interactive Eyes: Conjunctivae are normal.   Nose: No congestion/rhinnorhea. Mouth/Throat: Mucous membranes are moist.   Neck:  Painless ROM Cardiovascular: Normal rate, regular rhythm. Systolic murmur.  Good peripheral circulation. Respiratory: Normal respiratory effort.  No retractions. Lungs CTAB. Gastrointestinal: Soft and nontender. No distention.  No CVA tenderness. Genitourinary: deferred Musculoskeletal: 1+ edema bilaterally..  Warm and well perfused Neurologic:  Normal speech and language. No gross focal neurologic deficits are appreciated.  Skin:  Skin is warm, dry and intact. No rash noted. Psychiatric: Mood and affect are normal. Speech and behavior are normal.  ____________________________________________   LABS (all labs ordered are listed, but only abnormal results are displayed)  Labs Reviewed  BASIC METABOLIC PANEL - Abnormal; Notable for the following:       Result Value   Glucose, Bld 298 (*)    BUN 29 (*)    Creatinine, Ser 2.35 (*)    GFR calc non Af Amer  28 (*)    GFR calc Af Amer 32 (*)    All other components within normal limits  CBC - Abnormal; Notable for the following:    RBC 4.31 (*)    Platelets 114 (*)    All other components within normal limits  TROPONIN I - Abnormal; Notable for the following:    Troponin I 0.03 (*)    All other components within normal limits   ____________________________________________  EKG  ED ECG REPORT I, Lavonia Drafts, the attending physician, personally viewed and interpreted this ECG.  Date: 03/27/2016 EKG Time: 5:21 PM Rate: 68 Rhythm: normal sinus rhythm QRS Axis: normal Intervals: normal ST/T Wave abnormalities: normal Conduction Disturbances: none   ____________________________________________  RADIOLOGY  Chest x-ray unremarkable ____________________________________________   PROCEDURES  Procedure(s) performed: No    Critical Care performed:No ____________________________________________   INITIAL IMPRESSION / ASSESSMENT AND PLAN / ED COURSE  Pertinent labs & imaging results that were available during my care of the patient were reviewed by me and considered in my medical decision making (see chart for details).  Patient with a history of coronary artery disease, chronic kidney disease, diabetes presents with chest pain. He had a cardiac catheterization in June 2017 which showed multiple partial blockages. We will treat with nitroglycerin he has already taken aspirin today.  Clinical Course  Minimal improvement with nitroglycerin, we will treat with morphine IV ____________________________________________ Patient has a mildly elevated troponin, given his history feel admission is most appropriate.  FINAL CLINICAL IMPRESSION(S) / ED DIAGNOSES  Final diagnoses:  Chest pain, unspecified type      NEW MEDICATIONS STARTED DURING THIS VISIT:  New Prescriptions   No medications on file     Note:  This document was prepared using Dragon voice recognition  software and may include unintentional dictation errors.    Lavonia Drafts, MD 03/27/16 [phone]

## 2016-03-28 ENCOUNTER — Observation Stay: Payer: Medicare Other

## 2016-03-28 ENCOUNTER — Observation Stay
Admit: 2016-03-28 | Discharge: 2016-03-28 | Disposition: A | Discharge status: Home / Self Care | Payer: Medicare Other | Attending: Internal Medicine | Admitting: Internal Medicine

## 2016-03-28 DIAGNOSIS — B20 Human immunodeficiency virus [HIV] disease: Secondary | ICD-10-CM

## 2016-03-28 DIAGNOSIS — N183 Chronic kidney disease, stage 3 unspecified: Secondary | ICD-10-CM

## 2016-03-28 DIAGNOSIS — I5033 Acute on chronic diastolic (congestive) heart failure: Secondary | ICD-10-CM

## 2016-03-28 DIAGNOSIS — K7460 Unspecified cirrhosis of liver: Secondary | ICD-10-CM

## 2016-03-28 DIAGNOSIS — I5023 Acute on chronic systolic (congestive) heart failure: Secondary | ICD-10-CM

## 2016-03-28 DIAGNOSIS — M7989 Other specified soft tissue disorders: Secondary | ICD-10-CM

## 2016-03-28 DIAGNOSIS — R0603 Acute respiratory distress: Secondary | ICD-10-CM | POA: Diagnosis not present

## 2016-03-28 DIAGNOSIS — R079 Chest pain, unspecified: Secondary | ICD-10-CM | POA: Diagnosis not present

## 2016-03-28 DIAGNOSIS — R0789 Other chest pain: Secondary | ICD-10-CM | POA: Diagnosis not present

## 2016-03-28 DIAGNOSIS — B182 Chronic viral hepatitis C: Secondary | ICD-10-CM

## 2016-03-28 DIAGNOSIS — R0600 Dyspnea, unspecified: Secondary | ICD-10-CM

## 2016-03-28 DIAGNOSIS — R778 Other specified abnormalities of plasma proteins: Secondary | ICD-10-CM

## 2016-03-28 DIAGNOSIS — R7989 Other specified abnormal findings of blood chemistry: Secondary | ICD-10-CM

## 2016-03-28 LAB — BASIC METABOLIC PANEL
Anion gap: 6 (ref 5–15)
BUN: 26 mg/dL — AB (ref 6–20)
CHLORIDE: 105 mmol/L (ref 101–111)
CO2: 29 mmol/L (ref 22–32)
CREATININE: 2.22 mg/dL — AB (ref 0.61–1.24)
Calcium: 8.8 mg/dL — ABNORMAL LOW (ref 8.9–10.3)
GFR calc Af Amer: 34 mL/min — ABNORMAL LOW (ref >60)
GFR calc non Af Amer: 30 mL/min — ABNORMAL LOW (ref >60)
GLUCOSE: 182 mg/dL — AB (ref 65–99)
POTASSIUM: 3.6 mmol/L (ref 3.5–5.1)
Sodium: 140 mmol/L (ref 135–145)

## 2016-03-28 LAB — CBC
HEMATOCRIT: 39.4 % — AB (ref 40.0–52.0)
Hemoglobin: 12.9 g/dL — ABNORMAL LOW (ref 13.0–18.0)
MCH: 31.1 pg (ref 26.0–34.0)
MCHC: 32.8 g/dL (ref 32.0–36.0)
MCV: 94.8 fL (ref 80.0–100.0)
PLATELETS: 103 10*3/uL — AB (ref 150–440)
RBC: 4.15 MIL/uL — ABNORMAL LOW (ref 4.40–5.90)
RDW: 13.2 % (ref 11.5–14.5)
WBC: 5.8 10*3/uL (ref 3.8–10.6)

## 2016-03-28 LAB — ECHOCARDIOGRAM COMPLETE
Height: 70 in
Weight: 3547.2 oz

## 2016-03-28 LAB — TROPONIN I
Troponin I: <0.03 ng/mL (ref <0.03)
Troponin I: 0.01 ng/mL (ref <0.03)

## 2016-03-28 MED ORDER — FERROUS SULFATE 325 (65 FE) MG PO TABS
325.0000 mg | ORAL_TABLET | Freq: Two times a day (BID) | ORAL | Status: DC
  Administered 2016-03-28: 325 mg via ORAL
  Filled 2016-03-28: qty 1

## 2016-03-28 MED ORDER — TECHNETIUM TO 99M ALBUMIN AGGREGATED
4.0000 | Freq: Once | INTRAVENOUS | Status: AC | PRN
  Administered 2016-03-28: 4.35 via INTRAVENOUS

## 2016-03-28 MED ORDER — ISOSORBIDE MONONITRATE ER 30 MG PO TB24: 30.0000 mg | ORAL_TABLET | Freq: Every day | ORAL | 5 refills | Status: DC

## 2016-03-28 MED ORDER — TORSEMIDE 20 MG PO TABS: 20.0000 mg | ORAL_TABLET | Freq: Every day | ORAL | 5 refills | Status: AC

## 2016-03-28 MED ORDER — TECHNETIUM TC 99M DIETHYLENETRIAME-PENTAACETIC ACID
30.0000 | Freq: Once | INTRAVENOUS | Status: AC | PRN
  Administered 2016-03-28: 31.57 via RESPIRATORY_TRACT

## 2016-03-28 NOTE — Discharge Summary (Signed)
[facility] [place] NAME: Jared Walker    MR#:  [id number]  DATE OF BIRTH:  09/02/1950  DATE OF ADMISSION:  03/27/2016 ADMITTING PHYSICIAN: Gladstone Lighter, MD  DATE OF DISCHARGE: No discharge date for patient encounter.  PRIMARY CARE PHYSICIAN: HANDE,VISHWANATH, MD     ADMISSION DIAGNOSIS:  Ascites [R18.8] Chest pain, unspecified type [R07.9]  DISCHARGE DIAGNOSIS:  Principal Problem:   Musculoskeletal chest pain Active Problems:   Dyspnea   Elevated troponin   Acute on chronic systolic CHF (congestive heart failure) (HCC)   Swelling of lower extremity   HIV disease (HCC)   Acute on chronic diastolic CHF (congestive heart failure) (HCC)   Cirrhosis of liver without ascites (HCC)   Chronic renal failure in pediatric patient, stage 3 (moderate)   SECONDARY DIAGNOSIS:   Past Medical History:  Diagnosis Date  . Anemia   . Aortic stenosis, mild    mild AS by echo, 08/2011  . Chronic kidney disease    kidney stones  . Coronary artery disease   . Depression   . Full dentures    upper and lower  . Gout   . Heart murmur   . Hep C w/o coma, chronic ([place]) 01/19/2015  . Hepatitis C 09/25/11  . HIV positive ([place]) 09/25/11  . Hypertension   . MI (myocardial infarction)     .pro HOSPITAL COURSE:   The patient is 65 year old male with PMH significant for CAD, status post recent cardiac catheterization in June 2017 at [facility] with 90% blockage of obuse marginal,  on medical management, congestive heart failure, HIV on HAART, hepatitis C, CK D, hypertension presented to the hospital secondary to chest pain, described as tightness in his chest,  going down his left arm, the patient was diaphoretic,  Nauseous,  had difficulty breathing. He also complained that his lower extremity and abdominal swelling has worsened. Chest xray was normal, as well as VQ scan. Doppler US was negative for DVT. Abdominal US showed no ascites. The  patient was diuresed with IV lasix and felt some more comfortable. It was felt that patient's chest pain was likely musculoskeletal as it was reproducible on chestpressure . First troponin was minimally elevated to 0.03, but subsequent 3 troponins were normal. Renal function remained stable. The patient was seen by cardiologist, recommended to contineu Imdur, torsemide and follow up with PCP, specialists in [facility]. Discussion by problem: 1. Chest pain, likely musculoskeletal as reproducible. The patient had one troponin minimally elevated, he was seen by cardiologist, recommended to continue current medical therapy, cardiac rehabilitation. 2. Dyspnea, mild acute on chronic diastolic CHF,  CAD related, no PE on VQ, chest xray OK, continue torsemide, advance dose as outpatient if needed, following Cr closely, follow weight daily 3. Essential HTN, poorly controled, continue to watch diet, advance BP medications as needed as outpatient. 4. Leg swelling, no DVT 5. CKD stage 3, no change with IV lasix, follow as outpatient, nephrology follow up 6. HGIV, managed by ID at [facility] 7. Hep C related liver disease, needs to follow up with [facility] ID to discuss treatment options    DISCHARGE CONDITIONS:   stable  CONSULTS OBTAINED:  Treatment Team:  Minna Merritts, MD  DRUG ALLERGIES:   Allergies  Allergen Reactions  . Buchu-[place]sil[place]-Ch Grass-Hydran Other (See Comments)    Gout.  . Colchicine Diarrhea    "Have diarrhea when taken for long periods of time"  . Nifedipine Other (See Comments)    "  Unknown"  . Oxycodone-Acetaminophen Other (See Comments)    GI upset.  . Tramadol Other (See Comments)    "unknown"    DISCHARGE MEDICATIONS:   Current Discharge Medication List    START taking these medications   Details  isosorbide mononitrate (IMDUR) 30 MG 24 hr tablet Take 1 tablet (30 mg total) by mouth daily. Qty: 30 tablet, Refills: 5    torsemide (DEMADEX) 20 MG tablet Take 1 tablet (20 mg total)  by mouth daily. Qty: 30 tablet, Refills: 5      CONTINUE these medications which have NOT CHANGED   Details  allopurinol (ZYLOPRIM) 100 MG tablet Take 200 mg by mouth daily. *Take along with 300 mg tablet to equal total dose of 500 mg daily.*    amLODipine (NORVASC) 10 MG tablet Take 10 mg by mouth.    aspirin EC 81 MG EC tablet Take 1 tablet (81 mg total) by mouth daily. Qty: 120 tablet, Refills: 0    carvedilol (COREG) 12.5 MG tablet Take 12.5 mg by mouth 2 (two) times daily with a meal.    Cholecalciferol (VITAMIN D-1000 MAX ST) 1000 units tablet Take 1,000 Units by mouth daily.    clopidogrel (PLAVIX) 75 MG tablet Take 75 mg by mouth daily.    DESCOVY 200-25 MG tablet Take 1 tablet by mouth at bedtime.  Refills: 3   Associated Diagnoses: Iron deficiency anemia due to chronic blood loss    diazepam (VALIUM) 5 MG tablet Take 5 mg by mouth at bedtime. For sleep    docusate sodium (COLACE) 100 MG capsule Take 100 mg by mouth at bedtime.     dolutegravir (TIVICAY) 50 MG tablet Take 50 mg by mouth at bedtime.    Associated Diagnoses: Iron deficiency anemia due to chronic blood loss    folic acid (FOLVITE) 1 MG tablet Take 1 mg by mouth every evening.     HYDROcodone-acetaminophen (NORCO/VICODIN) 5-325 MG tablet Take 1 tablet by mouth See admin instructions. Take 1 tablet by mouth at bedtime if needed for pain, and Take 1 tablet by mouth 30 min before physical therapy. Refills: 0    losartan (COZAAR) 100 MG tablet Take 100 mg by mouth every morning.  Refills: 0    omeprazole (PRILOSEC) 40 MG capsule Take 40 mg by mouth every morning.  Refills: 0    sertraline (ZOLOFT) 50 MG tablet Take 50 mg by mouth every evening.     vitamin B-12 (CYANOCOBALAMIN) 1000 MCG tablet Take 1,000 mcg by mouth daily.    atorvastatin (LIPITOR) 10 MG tablet Take 80 mg by mouth.    ferrous sulfate 325 (65 FE) MG tablet Take 325 mg by mouth 2 (two) times daily.      STOP taking these medications      atenolol (TENORMIN) 100 MG tablet      Cyanocobalamin (RA VITAMIN B-12 TR) 1000 MCG TBCR          DISCHARGE INSTRUCTIONS:    The patient is to follow up with PCP, primary cardiology, ID as outpatient  If you experience worsening of your admission symptoms, develop shortness of breath, life threatening emergency, suicidal or homicidal thoughts you must seek medical attention immediately by calling 911 or calling your MD immediately  if symptoms less severe.  You Must read complete instructions/literature along with all the possible adverse reactions/side effects for all the Medicines you take and that have been prescribed to you. Take any new Medicines after you have completely understood and accept  all the possible adverse reactions/side effects.   Please note  You were cared for by a hospitalist during your hospital stay. If you have any questions about your discharge medications or the care you received while you were in the hospital after you are discharged, you can call the unit and asked to speak with the hospitalist on call if the hospitalist that took care of you is not available. Once you are discharged, your primary care physician will handle any further medical issues. Please note that NO REFILLS for any discharge medications will be authorized once you are discharged, as it is imperative that you return to your primary care physician (or establish a relationship with a primary care physician if you do not have one) for your aftercare needs so that they can reassess your need for medications and monitor your lab values.    Today   CHIEF COMPLAINT:   Chief Complaint  Patient presents with  . Shortness of Breath  . Chest Pain    HISTORY OF PRESENT ILLNESS:  Jared Walker  is a 65 y.o. male with a known history of CAD, status post recent cardiac catheterization in June 2017 at [facility] with 90% blockage of obuse marginal,  on medical management, congestive heart failure,  HIV on HAART, hepatitis C, CK D, hypertension presented to the hospital secondary to chest pain, described as tightness in his chest,  going down his left arm, the patient was diaphoretic,  Nauseous,  had difficulty breathing. He also complained that his lower extremity and abdominal swelling has worsened. Chest xray was normal, as well as VQ scan. Doppler US was negative for DVT. Abdominal US showed no ascites. The patient was diuresed with IV lasix and felt some more comfortable. It was felt that patient's chest pain was likely musculoskeletal as it was reproducible on chestpressure . First troponin was minimally elevated to 0.03, but subsequent 3 troponins were normal. Renal function remained stable. The patient was seen by cardiologist, recommended to contineu Imdur, torsemide and follow up with PCP, specialists in [facility]. Discussion by problem: 1. Chest pain, likely musculoskeletal as reproducible. The patient had one troponin minimally elevated, he was seen by cardiologist, recommended to continue current medical therapy, cardiac rehabilitation. 2. Dyspnea, mild acute on chronic diastolic CHF,  CAD related, no PE on VQ, chest xray OK, continue torsemide, advance dose as outpatient if needed, following Cr closely, follow weight daily 3. Essential HTN, poorly controled, continue to watch diet, advance BP medications as needed as outpatient. 4. Leg swelling, no DVT 5. CKD stage 3, no change with IV lasix, follow as outpatient, nephrology follow up 6. HGIV, managed by ID at [facility] 7. Hep C related liver disease, needs to follow up with [facility] ID to discuss treatment options   VITAL SIGNS:  Blood pressure 133/85, pulse 68, temperature 98.1 F (36.7 C), temperature source Oral, resp. rate 16, height 5\' 10"  (1.778 m), weight 100.6 kg (221 lb 11.2 oz), SpO2 93 %.  I/O:   Intake/Output Summary (Last 24 hours) at 03/28/16 1546 Last data filed at 03/28/16 1300  Gross per 24 hour  Intake              240 ml   Output             2200 ml  Net            -1960 ml    PHYSICAL EXAMINATION:  GENERAL:  65 y.o.-year-old patient lying in the bed with no acute  distress.  EYES: Pupils equal, round, reactive to light and accommodation. No scleral icterus. Extraocular muscles intact.  HEENT: Head atraumatic, normocephalic. Oropharynx and nasopharynx clear.  NECK:  Supple, no jugular venous distention. No thyroid enlargement, no tenderness.  LUNGS: Normal breath sounds bilaterally, no wheezing, rales,rhonchi or crepitation. No use of accessory muscles of respiration.  CARDIOVASCULAR: S1, S2 normal. No murmurs, rubs, or gallops.  ABDOMEN: Soft, non-tender, non-distended. Bowel sounds present. No organomegaly or mass.  EXTREMITIES: No pedal edema, cyanosis, or clubbing.  NEUROLOGIC: Cranial nerves II through XII are intact. Muscle strength 5/5 in all extremities. Sensation intact. Gait not checked.  PSYCHIATRIC: The patient is alert and oriented x 3.  SKIN: No obvious rash, lesion, or ulcer.   DATA REVIEW:   CBC  Recent Labs Lab 03/28/16 0334  WBC 5.8  HGB 12.9*  HCT 39.4*  PLT 103*    Chemistries   Recent Labs Lab 03/28/16 0334  NA 140  K 3.6  CL 105  CO2 29  GLUCOSE 182*  BUN 26*  CREATININE 2.22*  CALCIUM 8.8*    Cardiac Enzymes  Recent Labs Lab 03/28/16 0859  TROPONINI 0.01    Microbiology Results  Results for orders placed or performed during the hospital encounter of 09/25/11  Surgical pcr screen     Status: None   Collection Time: 09/25/11 12:15 PM  Result Value Ref Range Status   MRSA, PCR NEGATIVE NEGATIVE Final   Staphylococcus aureus NEGATIVE NEGATIVE Final    Comment:        The Xpert SA Assay (FDA approved for NASAL specimens only), is one component of a comprehensive surveillance program.  It is not intended to diagnose infection nor to guide or monitor treatment.    RADIOLOGY:  Dg Chest 2 View  Result Date: 03/27/2016 CLINICAL DATA:  Lethargy and  chest pain on the left. EXAM: CHEST  2 VIEW COMPARISON:  01/07/2016 FINDINGS: The lungs are clear wiithout focal pneumonia, edema, pneumothorax or pleural effusion. The cardiopericardial silhouette is within normal limits for size. The visualized bony structures of the thorax are intact. Telemetry leads overlie the chest. IMPRESSION: No active cardiopulmonary disease. Electronically Signed   By: Misty Stanley M.D.   On: 03/27/2016 18:02   Nm Pulmonary Perf And Vent  Result Date: 03/28/2016 CLINICAL DATA:  Chest pain. Shortness of breath for the past 2-3 weeks. EXAM: NUCLEAR MEDICINE VENTILATION - PERFUSION LUNG SCAN TECHNIQUE: Ventilation images were obtained in multiple projections using inhaled aerosol Tc-72m DTPA. Perfusion images were obtained in multiple projections after intravenous injection of Tc-23m MAA. RADIOPHARMACEUTICALS:  31.57 mCi Technetium-48m DTPA aerosol inhalation and 4.35 mCi Technetium-43m MAA IV COMPARISON:  Chest radiographs obtained yesterday. FINDINGS: Ventilation: No focal ventilation defect. Perfusion: No wedge shaped peripheral perfusion defects to suggest acute pulmonary embolism. IMPRESSION: Normal examination.  No evidence of pulmonary embolism. Electronically Signed   By: Claudie Revering M.D.   On: 03/28/2016 15:00   US Abdomen Limited  Result Date: 03/28/2016 CLINICAL DATA:  Rule out ascites EXAM: LIMITED ABDOMEN ULTRASOUND FOR ASCITES TECHNIQUE: Limited ultrasound survey for ascites was performed in all four abdominal quadrants. COMPARISON:  CT abdomen pelvis 05/11/2011 FINDINGS: Negative for ascites 6 cm right upper pole renal cyst. IMPRESSION: Negative for ascites. Electronically Signed   By: Franchot Gallo M.D.   On: 03/28/2016 09:45   US Venous Img Lower Bilateral  Result Date: 03/28/2016 CLINICAL DATA:  Dyspnea. Intermittent edema of the lower extremities for 2 months EXAM: BILATERAL LOWER EXTREMITY  VENOUS DUPLEX ULTRASOUND TECHNIQUE: Doppler venous assessment of  the bilateral lower extremity deep venous system was performed, including characterization of spectral flow, compressibility, and phasicity. COMPARISON:  None. FINDINGS: There is complete compressibility of the bilateral common femoral, femoral, and popliteal veins. Doppler analysis demonstrates respiratory phasicity and augmentation of flow with calf compression. No obvious superficial vein or calf vein thrombosis. IMPRESSION: No evidence of lower extremity DVT. Electronically Signed   By: Marybelle Killings M.D.   On: 03/28/2016 14:26    EKG:   Orders placed or performed during the hospital encounter of 03/27/16  . ED EKG within 10 minutes  . ED EKG within 10 minutes      Management plans discussed with the patient, family and they are in agreement.  CODE STATUS:     Code Status Orders        Start     Ordered   03/27/16 2054  Full code  Continuous     03/27/16 2054    Code Status History    Date Active Date Inactive Code Status Order ID Comments User Context   11/09/2015  5:01 PM 11/11/2015  6:13 PM Full Code [id number]  Idelle Crouch, MD Inpatient   01/19/2015  3:34 AM 01/23/2015  5:54 PM Full Code [id number]  Juluis Mire, MD ED      TOTAL TIME TAKING CARE OF THIS PATIENT: 40 minutes.    Theodoro Grist M.D on 03/28/2016 at 3:46 PM  Between 7am to 6pm - Pager - [phone]  After 6pm go to [email or web address] - password [email or web address]  Office  [phone]  CC: Primary care physician; Tracie Harrier, MD

## 2016-03-28 NOTE — Consult Note (Signed)
Cardiology Consultation Note  Patient ID: Jared Walker, MRN: [id number], DOB/AGE: [birth year] 65 y.o. Admit date: 03/27/2016   Date of Consult: 03/28/2016 Primary Physician: Tracie Harrier, MD Primary Cardiologist: Rica Records, [facility]  Chief Complaint: Shortness breath, chest pain Reason for Consult: Known coronary artery disease, chest pain Physician requesting consult: Dr. Tressia Miners   HPI: 65 y.o. male with a history of CAD with  NSTEMI 10/2015, HIV (diagnosed 12/1999), CKD, Chronic HCV/cirrhosis, gout, nephrotic syndrome, and HTN, most of his medical issues managed at [facility], presenting to [facility] for shortness of breath and chest discomfort .  He reports having long history of chest discomfort, typically managed with nitroglycerin .  Admitted on 11/12/2015 to the [facility] with  chest pain:,  shortness breath and diaphoresis with radiation to left arm.  Troponin to 0.58, EKG showing T-wave inversions in the lateral leads.  Echocardiography showed normal LVEF, diastolic dysfunction and moderate to severe LVH. LHC on 6/14  showed coronary artery disease including: 40% mid LAD at the D1 bifurcation area, 30-40% mid/distal LAD, 95% stenosis in the large branching OM, 80-90% stenoses in both distal branches of the large OM, diffuse mild disease in the RCA. medically management recommended at that time discharged on DAPT with aspirin and clopidogrel, Imdur   Main complaint is chronic abdominal swelling that seems to wax and wane, lower extremity edema, shortness of breath, chest pain daily (" twinges of pain ")  often coming on at rest. No regular exercise program. Sedentary at baseline per his wife  He has been participating with cardiac rehabilitation, watching his diet, Slow trend up in his weight over the past several months Previously on Lasix, change to torsemide several weeks ago Worsening creatinine over the past several months, previous baseline 1.5, now 2.35, BUN 29 He has nephrology,  cardiology, ID, primary care physicians   Non-Invasive Evaluation(s):  TTE 11/13/2015  Left ventricular hypertrophy - moderate  Normal left ventricular systolic function, ejection fraction > 55%  Dilated left atrium - mild  Aortic sclerosis  Normal right ventricular systolic function      Past Medical History:  Diagnosis Date  . Anemia   . Aortic stenosis, mild    mild AS by echo, 08/2011  . Chronic kidney disease    kidney stones  . Coronary artery disease   . Depression   . Full dentures    upper and lower  . Gout   . Heart murmur   . Hep C w/o coma, chronic ([place]) 01/19/2015  . Hepatitis C 09/25/11  . HIV positive ([place]) 09/25/11  . Hypertension   . MI (myocardial infarction)       Most Recent Cardiac Studies: Echocardiogram pending    Surgical History:  Past Surgical History:  Procedure Laterality Date  . BACK SURGERY    . CARDIOVASCULAR STRESS TEST     at [facility]  . CHOLECYSTECTOMY    . CLOSED REDUCTION PELVIC FRACTURE    . ESOPHAGOGASTRODUODENOSCOPY (EGD) WITH PROPOFOL N/A 01/21/2015   Procedure: ESOPHAGOGASTRODUODENOSCOPY (EGD) WITH PROPOFOL;  Surgeon: Manya Silvas, MD;  Location: [facility] ENDOSCOPY;  Service: Endoscopy;  Laterality: N/A;  . ESOPHAGOGASTRODUODENOSCOPY (EGD) WITH PROPOFOL N/A 09/04/2015   Procedure: ESOPHAGOGASTRODUODENOSCOPY (EGD) WITH PROPOFOL;  Surgeon: Manya Silvas, MD;  Location: [facility] ENDOSCOPY;  Service: Endoscopy;  Laterality: N/A;  . FRACTURE SURGERY    . KNEE ARTHROSCOPY  2009   Right  . LUMBAR LAMINECTOMY/DECOMPRESSION MICRODISCECTOMY  09/25/2011   Procedure: LUMBAR LAMINECTOMY/DECOMPRESSION MICRODISCECTOMY;  Surgeon: Erline Levine, MD;  Location: [place] NEURO ORS;  Service: Neurosurgery;  Laterality: Left;  Left Lumbar Four-Five Microdiskectomy  . NEPHROSTOMY     tube Left  . SHOULDER ARTHROSCOPY WITH OPEN ROTATOR CUFF REPAIR Right 10/12/2014   Procedure: SHOULDER ARTHROSCOP with decompression;  Surgeon: Leanor Kail, MD;  Location:  [place];  Service: Orthopedics;  Laterality: Right;     Home Meds: Prior to Admission medications   Medication Sig Start Date End Date Taking? Authorizing Provider  allopurinol (ZYLOPRIM) 100 MG tablet Take 200 mg by mouth daily. *Take along with 300 mg tablet to equal total dose of 500 mg daily.*   Yes Historical Provider, MD  allopurinol (ZYLOPRIM) 300 MG tablet Take 300 mg by mouth See admin instructions. *Take along with 2 tablets of 100 mg (200 mg) to equal total dose of 500 mg by mouth daily*   Yes Historical Provider, MD  amLODipine (NORVASC) 10 MG tablet Take 10 mg by mouth. 11/13/15 11/12/16 Yes Historical Provider, MD  aspirin EC 81 MG EC tablet Take 1 tablet (81 mg total) by mouth daily. 11/11/15  Yes Sital Mody, MD  atenolol (TENORMIN) 100 MG tablet Take 100 mg by mouth daily.    Yes Historical Provider, MD  carvedilol (COREG) 12.5 MG tablet Take 12.5 mg by mouth 2 (two) times daily with a meal.   Yes Historical Provider, MD  Cholecalciferol (VITAMIN D-1000 MAX ST) 1000 units tablet Take 1,000 Units by mouth daily.   Yes Historical Provider, MD  clopidogrel (PLAVIX) 75 MG tablet Take 75 mg by mouth daily.   Yes Historical Provider, MD  DESCOVY 200-25 MG tablet Take 1 tablet by mouth at bedtime.  08/12/15  Yes Historical Provider, MD  diazepam (VALIUM) 5 MG tablet Take 5 mg by mouth at bedtime. For sleep   Yes Historical Provider, MD  docusate sodium (COLACE) 100 MG capsule Take 100 mg by mouth at bedtime.  05/24/14  Yes Historical Provider, MD  dolutegravir (TIVICAY) 50 MG tablet Take 50 mg by mouth at bedtime.  07/31/15  Yes Historical Provider, MD  folic acid (FOLVITE) 1 MG tablet Take 1 mg by mouth every evening.    Yes Historical Provider, MD  HYDROcodone-acetaminophen (NORCO/VICODIN) 5-325 MG tablet Take 1 tablet by mouth See admin instructions. Take 1 tablet by mouth at bedtime if needed for pain, and Take 1 tablet by mouth 30 min before physical therapy. 10/18/15  Yes  Historical Provider, MD  losartan (COZAAR) 100 MG tablet Take 100 mg by mouth every morning.  10/31/15  Yes Historical Provider, MD  omeprazole (PRILOSEC) 40 MG capsule Take 40 mg by mouth every morning.  11/06/15  Yes Historical Provider, MD  sertraline (ZOLOFT) 50 MG tablet Take 50 mg by mouth every evening.    Yes Historical Provider, MD  vitamin B-12 (CYANOCOBALAMIN) 1000 MCG tablet Take 1,000 mcg by mouth daily.   Yes Historical Provider, MD  atorvastatin (LIPITOR) 10 MG tablet Take 80 mg by mouth. 11/13/15 12/13/15  Historical Provider, MD  ferrous sulfate 325 (65 FE) MG tablet Take 325 mg by mouth 2 (two) times daily. 01/10/15 01/10/16  Historical Provider, MD  isosorbide mononitrate (IMDUR) 30 MG 24 hr tablet Take 1 tablet (30 mg total) by mouth daily. 03/29/16   Theodoro Grist, MD  torsemide (DEMADEX) 20 MG tablet Take 1 tablet (20 mg total) by mouth daily. 03/28/16   Theodoro Grist, MD    Inpatient Medications:  . allopurinol  500 mg Oral Daily  . aspirin EC  81 mg Oral Daily  . carvedilol  12.5 mg Oral BID WC  . cholecalciferol  1,000 Units Oral Daily  . clopidogrel  75 mg Oral Daily  . diazepam  5 mg Oral QHS  . docusate sodium  100 mg Oral QHS  . dolutegravir  50 mg Oral QHS  . emtricitabine-tenofovir AF  1 tablet Oral QHS  . ferrous sulfate  325 mg Oral BID WC  . folic acid  1 mg Oral QPM  . furosemide  40 mg Intravenous Q12H  . heparin  5,000 Units Subcutaneous Q8H  . insulin aspart  0-5 Units Subcutaneous QHS  . insulin aspart  0-9 Units Subcutaneous TID WC  . isosorbide mononitrate  30 mg Oral Daily  . pantoprazole  40 mg Oral Daily  . sertraline  50 mg Oral QPM  . sodium chloride flush  3 mL Intravenous Q12H  . vitamin B-12  1,000 mcg Oral Daily      Allergies:  Allergies  Allergen Reactions  . Buchu-Cornsilk-Ch Grass-Hydran Other (See Comments)    Gout.  . Colchicine Diarrhea    "Have diarrhea when taken for long periods of time"  . Nifedipine Other (See Comments)     "Unknown"  . Oxycodone-Acetaminophen Other (See Comments)    GI upset.  . Tramadol Other (See Comments)    "unknown"    Social History   Social History  . Marital status: Married    Spouse name: N/A  . Number of children: N/A  . Years of education: N/A   Occupational History  . Not on file.   Social History Main Topics  . Smoking status: Former Research scientist (life sciences)  . Smokeless tobacco: Former Systems developer    Quit date: 03/13/1972  . Alcohol use No  . Drug use: No  . Sexual activity: Not on file   Other Topics Concern  . Not on file   Social History Narrative  . No narrative on file     Family History  Problem Relation Age of Onset  . Hypertension Mother   . Cancer Father   . Heart disease Sister   . Diabetes Brother   . Anesthesia problems Neg Hx      Review of Systems: Review of Systems  Constitutional: Negative.        Weight gain  Respiratory: Positive for shortness of breath.   Cardiovascular: Negative.   Gastrointestinal: Negative.        Abdominal swelling  Musculoskeletal: Negative.   Neurological: Negative.   Psychiatric/Behavioral: Negative.   All other systems reviewed and are negative.    Labs: CBC  Recent Labs  03/27/16 1731 03/28/16 0334  WBC 6.4 5.8  HGB 13.5 12.9*  HCT 40.8 39.4*  MCV 94.7 94.8  PLT 114* 333*   Basic Metabolic Panel  Recent Labs  03/27/16 1731 03/28/16 0334  NA 138 140  K 4.4 3.6  CL 103 105  CO2 27 29  GLUCOSE 298* 182*  BUN 29* 26*  CREATININE 2.35* 2.22*  CALCIUM 9.2 8.8*   Liver Function Tests No results for input(s): AST, ALT, ALKPHOS, BILITOT, PROT, ALBUMIN in the last 72 hours. No results for input(s): LIPASE, AMYLASE in the last 72 hours. Cardiac Enzymes  Recent Labs  03/27/16 2103 03/28/16 0334 03/28/16 0859  TROPONINI <0.03 <0.03 0.01   BNP Invalid input(s): POCBNP D-Dimer No results for input(s): DDIMER in the last 72 hours. Hemoglobin A1C No results for input(s): HGBA1C in the last 72  hours. Fasting Lipid Panel No  results for input(s): CHOL, HDL, LDLCALC, TRIG, CHOLHDL, LDLDIRECT in the last 72 hours. Thyroid Function Tests No results for input(s): TSH, T4TOTAL, T3FREE, THYROIDAB in the last 72 hours.  Invalid input(s): FREET3  Radiology/Studies:  Dg Chest 2 View  Result Date: 03/27/2016 CLINICAL DATA:  Lethargy and chest pain on the left. EXAM: CHEST  2 VIEW COMPARISON:  01/07/2016 FINDINGS: The lungs are clear wiithout focal pneumonia, edema, pneumothorax or pleural effusion. The cardiopericardial silhouette is within normal limits for size. The visualized bony structures of the thorax are intact. Telemetry leads overlie the chest. IMPRESSION: No active cardiopulmonary disease. Electronically Signed   By: Misty Stanley M.D.   On: 03/27/2016 18:02   US Abdomen Limited  Result Date: 03/28/2016 CLINICAL DATA:  Rule out ascites EXAM: LIMITED ABDOMEN ULTRASOUND FOR ASCITES TECHNIQUE: Limited ultrasound survey for ascites was performed in all four abdominal quadrants. COMPARISON:  CT abdomen pelvis 05/11/2011 FINDINGS: Negative for ascites 6 cm right upper pole renal cyst. IMPRESSION: Negative for ascites. Electronically Signed   By: Franchot Gallo M.D.   On: 03/28/2016 09:45    EKG: Normal sinus rhythm with rate 68 bpm, T-wave abnormality in V4 through V6, 1 and aVL  EKG lab work, chest x-ray, echocardiogram reviewed independently by myself  Weights: Filed Weights   03/27/16 1725 03/27/16 2054 03/28/16 0427  Weight: 220 lb (99.8 kg) 222 lb 9.6 oz (101 kg) 221 lb 11.2 oz (100.6 kg)     Physical Exam: Blood pressure 133/85, pulse 68, temperature 98.1 F (36.7 C), temperature source Oral, resp. rate 16, height 5\' 10"  (1.778 m), weight 221 lb 11.2 oz (100.6 kg), SpO2 93 %. Body mass index is 31.81 kg/m. GEN: Well nourished, well developed, in no acute distress, obese.  HEENT: Grossly normal.  Neck: Supple, no JVD, carotid bruits, or masses. Cardiac: RRR, no murmurs,  rubs, or gallops. No clubbing, cyanosis, edema.  Radials/DP/PT 2+ and equal bilaterally.  Respiratory:  Respirations regular and unlabored, clear to auscultation bilaterally. GI: Soft, nontender, nondistended, BS + x 4. MS: no deformity or atrophy. Skin: warm and dry, no rash. Neuro:  Strength and sensation are intact. Psych: AAOx3.  Normal affect.    Assessment and Plan:   1. Chest pain,  Symptoms somewhat atypical, presenting daily often at rest. Currently on aggressive medical management Known Coronary artery disease,  NSTEMI (11/12/15) coronary angiography June 2017 showed diffuse disease including 90% large OM without any intervention. -Would recommend that we continue his current medications, continue cardiac rehabilitation  This symptoms persist, could consider outpatient perfusion study to rule out ischemia in the lateral wall . If positive, could push for intervention if renal function improved  -Recommend follow-up with cardiology at [facility], Dr. Ishmael Holter    2) HFpEF:  Likely the primary reason for his admission with abdominal swelling, shortness of breath Already improved after several doses of IV Lasix Patient and wife report abdomen is less distended me shortness of breath has improved, still not at his baseline He does report high fluid intake, poor diet Recommended he weighed himself when he gets home after additional 24 hours of diuresis This would likely be close to his dry weight.  Within try to maintain weight at this level, may need to moderate fluid intake, increase Lasix for any weight gain of 3 pounds or more -Ultrasound performed showing no significant ascites  3) HTN:  History of poorly controlled hypertension Blood pressure recently well controlled, would continue current outpatient regiment Does not appear to  have significant leg swelling from amlodipine, would continue current dose 10 mg   4) CKD (chronic kidney disease) stage 3, GFR 30-59 ml/min  reports he  has been seen by nephrology  Unable to find recent notes in the chart  Last visit August 2016 with Dr.  Rosezella Rumpf,  Etiology of his kidney disease was unclear  He denies using NSAIDs  Possibly secondary to arterionephrosclerosis, long history of poor control hypertension, possible contribution from hepatitis C -Currently not a good candidate for cardiac intervention given worsening renal dysfunction over the past several months  5) Human immunodeficiency virus (HIV) disease  managed by infectious disease specialist at [place]) HepC Last seen by ID March 2016 Needs to set up close follow-up with ID at [facility]  to discuss hepatitis C treatment   Total encounter time more than 110 minutes  Greater than 50% was spent in counseling and coordination of care with the patient  Signed, Esmond Plants, MD, Ph.D [facility] 03/28/2016

## 2016-03-28 NOTE — Discharge Instructions (Signed)

## 2016-03-28 NOTE — Care Management Obs Status (Signed)
[place] NOTIFICATION   Patient Details  Name: Jared Walker MRN: [id number] Date of Birth: 1951/02/29   Medicare Observation Status Notification Given:  No (discharge order less than 24 hours)    Ival Bible, RN 03/28/2016, 1:33 PM

## 2016-03-28 NOTE — Progress Notes (Signed)
Pt takes ferrous sulfate at home. Notified MD,orders received

## 2016-03-28 NOTE — Progress Notes (Signed)
Pt arrived to floor via stretcher from ER on 03-27-16 @ 2050.Pt A&O, skin in tact. Telemetry Box [id number]. Pt oriented to room.

## 2016-03-29 LAB — HEMOGLOBIN A1C
HEMOGLOBIN A1C: 6.5 % — AB (ref 4.8–5.6)
MEAN PLASMA GLUCOSE: 140 mg/dL

## 2016-05-29 ENCOUNTER — Other Ambulatory Visit: Payer: Self-pay | Admitting: Internal Medicine

## 2016-05-29 DIAGNOSIS — M5441 Lumbago with sciatica, right side: Principal | ICD-10-CM

## 2016-05-29 DIAGNOSIS — G8929 Other chronic pain: Secondary | ICD-10-CM

## 2016-06-15 ENCOUNTER — Ambulatory Visit
Admission: RE | Admit: 2016-06-15 | Discharge: 2016-06-15 | Disposition: A | Discharge status: Home / Self Care | Payer: Medicare Other | Source: Ambulatory Visit | Attending: Internal Medicine | Admitting: Internal Medicine

## 2016-06-15 DIAGNOSIS — M48061 Spinal stenosis, lumbar region without neurogenic claudication: Secondary | ICD-10-CM | POA: Diagnosis not present

## 2016-06-15 DIAGNOSIS — M5126 Other intervertebral disc displacement, lumbar region: Secondary | ICD-10-CM | POA: Insufficient documentation

## 2016-06-15 DIAGNOSIS — G8929 Other chronic pain: Secondary | ICD-10-CM | POA: Insufficient documentation

## 2016-06-15 DIAGNOSIS — M5441 Lumbago with sciatica, right side: Secondary | ICD-10-CM | POA: Diagnosis present

## 2016-08-08 ENCOUNTER — Emergency Department: Payer: Medicare Other

## 2016-08-08 ENCOUNTER — Emergency Department
Admission: EM | Admit: 2016-08-08 | Discharge: 2016-08-08 | Disposition: A | Discharge status: Home / Self Care | Payer: Medicare Other | Attending: Emergency Medicine | Admitting: Emergency Medicine

## 2016-08-08 ENCOUNTER — Encounter: Payer: Self-pay | Admitting: Obstetrics and Gynecology

## 2016-08-08 DIAGNOSIS — Z21 Asymptomatic human immunodeficiency virus [HIV] infection status: Secondary | ICD-10-CM | POA: Diagnosis not present

## 2016-08-08 DIAGNOSIS — E1122 Type 2 diabetes mellitus with diabetic chronic kidney disease: Secondary | ICD-10-CM | POA: Diagnosis not present

## 2016-08-08 DIAGNOSIS — I2510 Atherosclerotic heart disease of native coronary artery without angina pectoris: Secondary | ICD-10-CM | POA: Insufficient documentation

## 2016-08-08 DIAGNOSIS — N183 Chronic kidney disease, stage 3 (moderate): Secondary | ICD-10-CM | POA: Diagnosis not present

## 2016-08-08 DIAGNOSIS — Z7982 Long term (current) use of aspirin: Secondary | ICD-10-CM | POA: Diagnosis not present

## 2016-08-08 DIAGNOSIS — N289 Disorder of kidney and ureter, unspecified: Secondary | ICD-10-CM | POA: Diagnosis not present

## 2016-08-08 DIAGNOSIS — Z79899 Other long term (current) drug therapy: Secondary | ICD-10-CM | POA: Diagnosis not present

## 2016-08-08 DIAGNOSIS — R05 Cough: Secondary | ICD-10-CM

## 2016-08-08 DIAGNOSIS — I252 Old myocardial infarction: Secondary | ICD-10-CM | POA: Insufficient documentation

## 2016-08-08 DIAGNOSIS — I130 Hypertensive heart and chronic kidney disease with heart failure and stage 1 through stage 4 chronic kidney disease, or unspecified chronic kidney disease: Secondary | ICD-10-CM | POA: Insufficient documentation

## 2016-08-08 DIAGNOSIS — J209 Acute bronchitis, unspecified: Secondary | ICD-10-CM | POA: Diagnosis not present

## 2016-08-08 DIAGNOSIS — I5043 Acute on chronic combined systolic (congestive) and diastolic (congestive) heart failure: Secondary | ICD-10-CM | POA: Diagnosis not present

## 2016-08-08 DIAGNOSIS — R059 Cough, unspecified: Secondary | ICD-10-CM

## 2016-08-08 DIAGNOSIS — Z87891 Personal history of nicotine dependence: Secondary | ICD-10-CM | POA: Diagnosis not present

## 2016-08-08 LAB — BASIC METABOLIC PANEL
ANION GAP: 11 (ref 5–15)
ANION GAP: 9 (ref 5–15)
BUN: 47 mg/dL — ABNORMAL HIGH (ref 6–20)
BUN: 46 mg/dL — ABNORMAL HIGH (ref 6–20)
CALCIUM: 10.1 mg/dL (ref 8.9–10.3)
CALCIUM: 9.3 mg/dL (ref 8.9–10.3)
CO2: 25 mmol/L (ref 22–32)
CO2: 25 mmol/L (ref 22–32)
CREATININE: 2.78 mg/dL — AB (ref 0.61–1.24)
Chloride: 100 mmol/L — ABNORMAL LOW (ref 101–111)
Chloride: 103 mmol/L (ref 101–111)
Creatinine, Ser: 3.08 mg/dL — ABNORMAL HIGH (ref 0.61–1.24)
GFR calc non Af Amer: 23 mL/min — ABNORMAL LOW (ref >60)
GFR, EST AFRICAN AMERICAN: 23 mL/min — AB (ref >60)
GFR, EST AFRICAN AMERICAN: 26 mL/min — AB (ref >60)
GFR, EST NON AFRICAN AMERICAN: 20 mL/min — AB (ref >60)
Glucose, Bld: 181 mg/dL — ABNORMAL HIGH (ref 65–99)
Glucose, Bld: 147 mg/dL — ABNORMAL HIGH (ref 65–99)
Potassium: 4.5 mmol/L (ref 3.5–5.1)
Potassium: 4.2 mmol/L (ref 3.5–5.1)
SODIUM: 136 mmol/L (ref 135–145)
Sodium: 137 mmol/L (ref 135–145)

## 2016-08-08 LAB — CBC
HCT: 43.7 % (ref 40.0–52.0)
HEMOGLOBIN: 14.7 g/dL (ref 13.0–18.0)
MCH: 31.6 pg (ref 26.0–34.0)
MCHC: 33.7 g/dL (ref 32.0–36.0)
MCV: 93.9 fL (ref 80.0–100.0)
Platelets: 136 10*3/uL — ABNORMAL LOW (ref 150–440)
RBC: 4.65 MIL/uL (ref 4.40–5.90)
RDW: 13.2 % (ref 11.5–14.5)
WBC: 9 10*3/uL (ref 3.8–10.6)

## 2016-08-08 LAB — TROPONIN I

## 2016-08-08 LAB — BRAIN NATRIURETIC PEPTIDE: B NATRIURETIC PEPTIDE 5: 13 pg/mL (ref 0.0–100.0)

## 2016-08-08 MED ORDER — HYDROCODONE-ACETAMINOPHEN 5-325 MG PO TABS
2.0000 | ORAL_TABLET | Freq: Once | ORAL | Status: AC
  Administered 2016-08-08: 2 via ORAL
  Filled 2016-08-08: qty 2

## 2016-08-08 MED ORDER — LEVOFLOXACIN 500 MG PO TABS: 500.0000 mg | ORAL_TABLET | Freq: Every day | ORAL | 0 refills | Status: AC

## 2016-08-08 MED ORDER — HYDROCOD POLST-CPM POLST ER 10-8 MG/5ML PO SUER
5.0000 mL | Freq: Once | ORAL | Status: AC
  Administered 2016-08-08: 5 mL via ORAL

## 2016-08-08 MED ORDER — HYDROCOD POLST-CPM POLST ER 10-8 MG/5ML PO SUER
ORAL | Status: AC
  Administered 2016-08-08: 5 mL via ORAL
  Filled 2016-08-08: qty 5

## 2016-08-08 MED ORDER — HYDROCOD POLST-CPM POLST ER 10-8 MG/5ML PO SUER: 5.0000 mL | Freq: Two times a day (BID) | ORAL | 0 refills | Status: DC

## 2016-08-08 MED ORDER — LEVOFLOXACIN 750 MG PO TABS
750.0000 mg | ORAL_TABLET | Freq: Once | ORAL | Status: AC
  Administered 2016-08-08: 750 mg via ORAL
  Filled 2016-08-08: qty 1

## 2016-08-08 MED ORDER — SODIUM CHLORIDE 0.9 % IV SOLN
Freq: Once | INTRAVENOUS | Status: AC
  Administered 2016-08-08: 20:00:00 via INTRAVENOUS

## 2016-08-08 MED ORDER — IPRATROPIUM-ALBUTEROL 0.5-2.5 (3) MG/3ML IN SOLN
RESPIRATORY_TRACT | Status: AC
  Administered 2016-08-08: 3 mL
  Filled 2016-08-08: qty 3

## 2016-08-08 NOTE — ED Triage Notes (Signed)
Pt states that his cough started earlier in the week. He has had a productive cough with yellow production. He reports some nausea. He states he has tried robitussin and Claritin and neither have relieved his symptoms.

## 2016-08-08 NOTE — ED Provider Notes (Signed)
[facility] Emergency Department Provider Note        Time seen: ----------------------------------------- 7:45 PM on 08/08/2016 -----------------------------------------    I have reviewed the triage vital signs and the nursing notes.   HISTORY  Chief Complaint Cough    HPI Jared Walker is a 66 y.o. male who presents to ER for cough. Patient's had a productive cough of yellow sputum production for the past 3 days. Patient states his cough started earlier this week, he has had some nausea and has been taking over-the-counter medications without any improvement. He denies fevers or chills, pain is 9 out of 10 with coughing.   Past Medical History:  Diagnosis Date  . Anemia   . Aortic stenosis, mild    mild AS by echo, 08/2011  . Chronic kidney disease    kidney stones  . Coronary artery disease   . Depression   . Full dentures    upper and lower  . Gout   . Heart murmur   . Hep C w/o coma, chronic ([place]) 01/19/2015  . Hepatitis C 09/25/11  . HIV positive ([place]) 09/25/11  . Hypertension   . MI (myocardial infarction)     Patient Active Problem List   Diagnosis Date Noted  . Dyspnea 03/28/2016  . Elevated troponin 03/28/2016  . Acute on chronic systolic CHF (congestive heart failure) ([place]) 03/28/2016  . Swelling of lower extremity 03/28/2016  . HIV disease ([place]) 03/28/2016  . Acute on chronic diastolic CHF (congestive heart failure) ([place])   . Cirrhosis of liver without ascites ([place])   . Chronic renal failure in pediatric patient, stage 3 (moderate)   . BP (high blood pressure) 12/11/2015  . Acute non-ST elevation myocardial infarction (NSTEMI) ([place]) 11/13/2015  . Chest pain 11/09/2015  . Bradycardia 11/09/2015  . Abnormal EKG 11/09/2015  . Iron deficiency anemia 08/27/2015  . Musculoskeletal chest pain 01/19/2015  . Anemia 01/19/2015  . GI bleed 01/19/2015  . Pica in adults 01/19/2015  . CKD (chronic kidney disease), stage III  01/19/2015  . HIV (human immunodeficiency virus infection) ([place]) 01/19/2015  . HTN (hypertension) 01/19/2015  . Chronic hepatitis C without hepatic coma ([place]) 01/19/2015  . CKD (chronic kidney disease) 01/19/2015  . History of arthroscopy of shoulder 11/13/2014    Past Surgical History:  Procedure Laterality Date  . BACK SURGERY    . CARDIOVASCULAR STRESS TEST     at [facility]  . CHOLECYSTECTOMY    . CLOSED REDUCTION PELVIC FRACTURE    . ESOPHAGOGASTRODUODENOSCOPY (EGD) WITH PROPOFOL N/A 01/21/2015   Procedure: ESOPHAGOGASTRODUODENOSCOPY (EGD) WITH PROPOFOL;  Surgeon: Manya Silvas, MD;  Location: [facility] ENDOSCOPY;  Service: Endoscopy;  Laterality: N/A;  . ESOPHAGOGASTRODUODENOSCOPY (EGD) WITH PROPOFOL N/A 09/04/2015   Procedure: ESOPHAGOGASTRODUODENOSCOPY (EGD) WITH PROPOFOL;  Surgeon: Manya Silvas, MD;  Location: [facility] ENDOSCOPY;  Service: Endoscopy;  Laterality: N/A;  . FRACTURE SURGERY    . KNEE ARTHROSCOPY  2009   Right  . LUMBAR LAMINECTOMY/DECOMPRESSION MICRODISCECTOMY  09/25/2011   Procedure: LUMBAR LAMINECTOMY/DECOMPRESSION MICRODISCECTOMY;  Surgeon: Erline Levine, MD;  Location: [place] NEURO ORS;  Service: Neurosurgery;  Laterality: Left;  Left Lumbar Four-Five Microdiskectomy  . NEPHROSTOMY     tube Left  . SHOULDER ARTHROSCOPY WITH OPEN ROTATOR CUFF REPAIR Right 10/12/2014   Procedure: SHOULDER ARTHROSCOP with decompression;  Surgeon: Leanor Kail, MD;  Location: [place];  Service: Orthopedics;  Laterality: Right;    Allergies Buchu-cornsilk-ch grass-hydran; Colchicine; Nifedipine; Oxycodone-acetaminophen; and Tramadol  Social History Social  History  Substance Use Topics  . Smoking status: Former Research scientist (life sciences)  . Smokeless tobacco: Former Systems developer    Quit date: 03/13/1972  . Alcohol use No    Review of Systems Constitutional: Negative for fever. Cardiovascular: Positive for chest pain Respiratory: Positive for shortness of breath and cough Gastrointestinal: Negative  for abdominal pain, vomiting and diarrhea. Genitourinary: Negative for dysuria. Musculoskeletal: Negative for back pain. Skin: Negative for rash. Neurological: Negative for headaches, focal weakness or numbness.  10-point ROS otherwise negative.  ____________________________________________   PHYSICAL EXAM:  VITAL SIGNS: ED Triage Vitals  Enc Vitals Group     BP --      Pulse Rate 08/08/16 1735 87     Resp --      Temp 08/08/16 1735 99.1 F (37.3 C)     Temp Source 08/08/16 1735 Oral     SpO2 08/08/16 1735 97 %     Weight 08/08/16 1738 220 lb (99.8 kg)     Height 08/08/16 1738 5\' 11"  (1.803 m)     Head Circumference --      Peak Flow --      Pain Score 08/08/16 1739 9     Pain Loc --      Pain Edu? --      Excl. in [place]? --     Constitutional: Alert and oriented. Well appearing and in no distress. Eyes: Conjunctivae are normal. PERRL. Normal extraocular movements. ENT   Head: Normocephalic and atraumatic.   Nose: No congestion/rhinnorhea.   Mouth/Throat: Mucous membranes are moist.   Neck: No stridor. Cardiovascular: Normal rate, regular rhythm. No murmurs, rubs, or gallops. Respiratory: Bilateral wheezing with diminished breath sounds Gastrointestinal: Distended, nontender. Normal bowel sounds Musculoskeletal: Nontender with normal range of motion in all extremities. No lower extremity tenderness nor edema. Neurologic:  Normal speech and language. No gross focal neurologic deficits are appreciated.  Skin:  Skin is warm, dry and intact. No rash noted. Psychiatric: Mood and affect are normal. Speech and behavior are normal.  ____________________________________________  EKG: Interpreted by me. Sinus rhythm rate of 79 bpm, prolonged PR interval, normal QRS, normal QT, nonspecific T wave abnormality  ____________________________________________  ED COURSE:  Pertinent labs & imaging results that were available during my care of the patient were reviewed by  me and considered in my medical decision making (see chart for details). Patient presents to the ER for persistent cough and shortness of breath. He will receive a DuoNeb, we will check labs and an x-ray.   Procedures ____________________________________________   LABS (pertinent positives/negatives)  Labs Reviewed  BASIC METABOLIC PANEL - Abnormal; Notable for the following:       Result Value   Chloride 100 (*)    Glucose, Bld 181 (*)    BUN 47 (*)    Creatinine, Ser 3.08 (*)    GFR calc non Af Amer 20 (*)    GFR calc Af Amer 23 (*)    All other components within normal limits  CBC - Abnormal; Notable for the following:    Platelets 136 (*)    All other components within normal limits  BASIC METABOLIC PANEL - Abnormal; Notable for the following:    Glucose, Bld 147 (*)    BUN 46 (*)    Creatinine, Ser 2.78 (*)    GFR calc non Af Amer 23 (*)    GFR calc Af Amer 26 (*)    All other components within normal limits  TROPONIN I  BRAIN NATRIURETIC  PEPTIDE    RADIOLOGY Images were viewed by me  Chest x-ray is unremarkable  ____________________________________________  FINAL ASSESSMENT AND PLAN  Dyspnea, cough, renal failure  Plan: Patient with labs and imaging as dictated above. Patient presented with viral or bronchitic symptoms. His creatinine improved from 3.082 2.78 after a liter of fluids. I will encourage increase fluid intake and to hold his Lasix for diastolic heart failure tomorrow. He'll be prescribed cough medicine as well as antibiotics and is encouraged to have close outpatient follow-up.   Earleen Newport, MD   Note: This note was generated in part or whole with voice recognition software. Voice recognition is usually quite accurate but there are transcription errors that can and very often do occur. I apologize for any typographical errors that were not detected and corrected.     Earleen Newport, MD 08/08/16 2214

## 2016-08-12 ENCOUNTER — Emergency Department
Admission: EM | Admit: 2016-08-12 | Discharge: 2016-08-12 | Disposition: A | Discharge status: Home / Self Care | Payer: Medicare Other | Attending: Emergency Medicine | Admitting: Emergency Medicine

## 2016-08-12 ENCOUNTER — Emergency Department: Payer: Medicare Other

## 2016-08-12 ENCOUNTER — Encounter: Payer: Self-pay | Admitting: Emergency Medicine

## 2016-08-12 DIAGNOSIS — I130 Hypertensive heart and chronic kidney disease with heart failure and stage 1 through stage 4 chronic kidney disease, or unspecified chronic kidney disease: Secondary | ICD-10-CM | POA: Diagnosis not present

## 2016-08-12 DIAGNOSIS — Z79899 Other long term (current) drug therapy: Secondary | ICD-10-CM | POA: Insufficient documentation

## 2016-08-12 DIAGNOSIS — Z87891 Personal history of nicotine dependence: Secondary | ICD-10-CM | POA: Insufficient documentation

## 2016-08-12 DIAGNOSIS — Z21 Asymptomatic human immunodeficiency virus [HIV] infection status: Secondary | ICD-10-CM | POA: Insufficient documentation

## 2016-08-12 DIAGNOSIS — Z7982 Long term (current) use of aspirin: Secondary | ICD-10-CM | POA: Insufficient documentation

## 2016-08-12 DIAGNOSIS — J40 Bronchitis, not specified as acute or chronic: Secondary | ICD-10-CM | POA: Diagnosis not present

## 2016-08-12 DIAGNOSIS — I5033 Acute on chronic diastolic (congestive) heart failure: Secondary | ICD-10-CM | POA: Insufficient documentation

## 2016-08-12 DIAGNOSIS — N183 Chronic kidney disease, stage 3 (moderate): Secondary | ICD-10-CM | POA: Diagnosis not present

## 2016-08-12 DIAGNOSIS — R0602 Shortness of breath: Secondary | ICD-10-CM | POA: Diagnosis present

## 2016-08-12 LAB — COMPREHENSIVE METABOLIC PANEL
ALBUMIN: 4.1 g/dL (ref 3.5–5.0)
ALT: 73 U/L — AB (ref 17–63)
AST: 63 U/L — AB (ref 15–41)
Alkaline Phosphatase: 73 U/L (ref 38–126)
Anion gap: 7 (ref 5–15)
BUN: 43 mg/dL — AB (ref 6–20)
CHLORIDE: 104 mmol/L (ref 101–111)
CO2: 28 mmol/L (ref 22–32)
CREATININE: 2.68 mg/dL — AB (ref 0.61–1.24)
Calcium: 9.2 mg/dL (ref 8.9–10.3)
GFR calc Af Amer: 27 mL/min — ABNORMAL LOW (ref >60)
GFR calc non Af Amer: 24 mL/min — ABNORMAL LOW (ref >60)
Glucose, Bld: 145 mg/dL — ABNORMAL HIGH (ref 65–99)
POTASSIUM: 4.7 mmol/L (ref 3.5–5.1)
SODIUM: 139 mmol/L (ref 135–145)
Total Bilirubin: 0.9 mg/dL (ref 0.3–1.2)
Total Protein: 8 g/dL (ref 6.5–8.1)

## 2016-08-12 LAB — CBC
HCT: 40.5 % (ref 40.0–52.0)
Hemoglobin: 13.7 g/dL (ref 13.0–18.0)
MCH: 31.4 pg (ref 26.0–34.0)
MCHC: 33.9 g/dL (ref 32.0–36.0)
MCV: 92.6 fL (ref 80.0–100.0)
PLATELETS: 132 10*3/uL — AB (ref 150–440)
RBC: 4.37 MIL/uL — AB (ref 4.40–5.90)
RDW: 12.9 % (ref 11.5–14.5)
WBC: 7.3 10*3/uL (ref 3.8–10.6)

## 2016-08-12 LAB — BRAIN NATRIURETIC PEPTIDE: B NATRIURETIC PEPTIDE 5: 29 pg/mL (ref 0.0–100.0)

## 2016-08-12 LAB — TROPONIN I: Troponin I: <0.03 ng/mL (ref <0.03)

## 2016-08-12 MED ORDER — PREDNISONE 50 MG PO TABS: 50.0000 mg | ORAL_TABLET | Freq: Every day | ORAL | 0 refills | Status: DC

## 2016-08-12 MED ORDER — METHYLPREDNISOLONE SODIUM SUCC 125 MG IJ SOLR
125.0000 mg | Freq: Once | INTRAMUSCULAR | Status: AC
  Administered 2016-08-12: 125 mg via INTRAVENOUS

## 2016-08-12 MED ORDER — ALBUTEROL SULFATE HFA 108 (90 BASE) MCG/ACT IN AERS: 2.0000 | INHALATION_SPRAY | Freq: Four times a day (QID) | RESPIRATORY_TRACT | 2 refills | Status: DC | PRN

## 2016-08-12 MED ORDER — METHYLPREDNISOLONE SODIUM SUCC 125 MG IJ SOLR
INTRAMUSCULAR | Status: AC
  Administered 2016-08-12: 125 mg via INTRAVENOUS
  Filled 2016-08-12: qty 2

## 2016-08-12 MED ORDER — IPRATROPIUM-ALBUTEROL 0.5-2.5 (3) MG/3ML IN SOLN
3.0000 mL | Freq: Once | RESPIRATORY_TRACT | Status: AC
  Administered 2016-08-12: 3 mL via RESPIRATORY_TRACT
  Filled 2016-08-12: qty 3

## 2016-08-12 MED ORDER — IPRATROPIUM-ALBUTEROL 0.5-2.5 (3) MG/3ML IN SOLN
RESPIRATORY_TRACT | Status: AC
  Administered 2016-08-12: 3 mL via RESPIRATORY_TRACT
  Filled 2016-08-12: qty 3

## 2016-08-12 MED ORDER — IPRATROPIUM-ALBUTEROL 0.5-2.5 (3) MG/3ML IN SOLN
3.0000 mL | Freq: Once | RESPIRATORY_TRACT | Status: AC
  Administered 2016-08-12: 3 mL via RESPIRATORY_TRACT

## 2016-08-12 NOTE — ED Notes (Signed)
Pt reports non productive cough for 2 weeks, was seen here Saturday and given abx and steroids without relief.

## 2016-08-12 NOTE — ED Triage Notes (Signed)
Pt was seen here on Saturday for cough and given meds for cough, pt returns today because he states he is not much better. States he coughs so much that he feels like he cannot breathe. No resp distress  Noted.

## 2016-08-12 NOTE — ED Notes (Signed)
Pt returned from xray via stretcher.

## 2016-08-12 NOTE — ED Provider Notes (Signed)
[facility] Emergency Department Provider Note   ____________________________________________    I have reviewed the triage vital signs and the nursing notes.   HISTORY  Chief Complaint Cough and Shortness of Breath     HPI Jared Walker is a 66 y.o. male who presents with complaints of cough and chest discomfort. Patient reports he was seen on the 10th of this month and treated for cough with antibiotics and cough medication but he reports he has not improved at all. He reports feeling short of breath and has aching in his chest. Denies fevers. No calf pain or swelling. No nausea or vomiting. No diaphoresis. Patient with history of HIV, very compliant with medications.   Past Medical History:  Diagnosis Date  . Anemia   . Aortic stenosis, mild    mild AS by echo, 08/2011  . Chronic kidney disease    kidney stones  . Coronary artery disease   . Depression   . Full dentures    upper and lower  . Gout   . Heart murmur   . Hep C w/o coma, chronic ([place]) 01/19/2015  . Hepatitis C 09/25/11  . HIV positive ([place]) 09/25/11  . Hypertension   . MI (myocardial infarction)     Patient Active Problem List   Diagnosis Date Noted  . Dyspnea 03/28/2016  . Elevated troponin 03/28/2016  . Acute on chronic systolic CHF (congestive heart failure) ([place]) 03/28/2016  . Swelling of lower extremity 03/28/2016  . HIV disease ([place]) 03/28/2016  . Acute on chronic diastolic CHF (congestive heart failure) ([place])   . Cirrhosis of liver without ascites ([place])   . Chronic renal failure in pediatric patient, stage 3 (moderate)   . BP (high blood pressure) 12/11/2015  . Acute non-ST elevation myocardial infarction (NSTEMI) ([place]) 11/13/2015  . Chest pain 11/09/2015  . Bradycardia 11/09/2015  . Abnormal EKG 11/09/2015  . Iron deficiency anemia 08/27/2015  . Musculoskeletal chest pain 01/19/2015  . Anemia 01/19/2015  . GI bleed 01/19/2015  . Pica in adults  01/19/2015  . CKD (chronic kidney disease), stage III 01/19/2015  . HIV (human immunodeficiency virus infection) ([place]) 01/19/2015  . HTN (hypertension) 01/19/2015  . Chronic hepatitis C without hepatic coma ([place]) 01/19/2015  . CKD (chronic kidney disease) 01/19/2015  . History of arthroscopy of shoulder 11/13/2014    Past Surgical History:  Procedure Laterality Date  . BACK SURGERY    . CARDIOVASCULAR STRESS TEST     at [facility]  . CHOLECYSTECTOMY    . CLOSED REDUCTION PELVIC FRACTURE    . ESOPHAGOGASTRODUODENOSCOPY (EGD) WITH PROPOFOL N/A 01/21/2015   Procedure: ESOPHAGOGASTRODUODENOSCOPY (EGD) WITH PROPOFOL;  Surgeon: Manya Silvas, MD;  Location: [facility] ENDOSCOPY;  Service: Endoscopy;  Laterality: N/A;  . ESOPHAGOGASTRODUODENOSCOPY (EGD) WITH PROPOFOL N/A 09/04/2015   Procedure: ESOPHAGOGASTRODUODENOSCOPY (EGD) WITH PROPOFOL;  Surgeon: Manya Silvas, MD;  Location: [facility] ENDOSCOPY;  Service: Endoscopy;  Laterality: N/A;  . FRACTURE SURGERY    . KNEE ARTHROSCOPY  2009   Right  . LUMBAR LAMINECTOMY/DECOMPRESSION MICRODISCECTOMY  09/25/2011   Procedure: LUMBAR LAMINECTOMY/DECOMPRESSION MICRODISCECTOMY;  Surgeon: Erline Levine, MD;  Location: [place] NEURO ORS;  Service: Neurosurgery;  Laterality: Left;  Left Lumbar Four-Five Microdiskectomy  . NEPHROSTOMY     tube Left  . SHOULDER ARTHROSCOPY WITH OPEN ROTATOR CUFF REPAIR Right 10/12/2014   Procedure: SHOULDER ARTHROSCOP with decompression;  Surgeon: Leanor Kail, MD;  Location: [place];  Service: Orthopedics;  Laterality: Right;  Prior to Admission medications   Medication Sig Start Date End Date Taking? Authorizing Provider  albuterol (PROVENTIL HFA;VENTOLIN HFA) 108 (90 Base) MCG/ACT inhaler Inhale 2 puffs into the lungs every 6 (six) hours as needed for wheezing or shortness of breath. 08/12/16   Lavonia Drafts, MD  allopurinol (ZYLOPRIM) 100 MG tablet Take 200 mg by mouth daily. *Take along with 300 mg tablet to equal total  dose of 500 mg daily.*    Historical Provider, MD  amLODipine (NORVASC) 10 MG tablet Take 10 mg by mouth. 11/13/15 11/12/16  Historical Provider, MD  aspirin EC 81 MG EC tablet Take 1 tablet (81 mg total) by mouth daily. 11/11/15   Bettey Costa, MD  atorvastatin (LIPITOR) 10 MG tablet Take 80 mg by mouth. 11/13/15 12/13/15  Historical Provider, MD  carvedilol (COREG) 12.5 MG tablet Take 12.5 mg by mouth 2 (two) times daily with a meal.    Historical Provider, MD  chlorpheniramine-HYDROcodone (TUSSIONEX PENNKINETIC ER) 10-8 MG/5ML SUER Take 5 mLs by mouth 2 (two) times daily. 08/08/16   Earleen Newport, MD  Cholecalciferol (VITAMIN D-1000 MAX ST) 1000 units tablet Take 1,000 Units by mouth daily.    Historical Provider, MD  clopidogrel (PLAVIX) 75 MG tablet Take 75 mg by mouth daily.    Historical Provider, MD  DESCOVY 200-25 MG tablet Take 1 tablet by mouth at bedtime.  08/12/15   Historical Provider, MD  diazepam (VALIUM) 5 MG tablet Take 5 mg by mouth at bedtime. For sleep    Historical Provider, MD  docusate sodium (COLACE) 100 MG capsule Take 100 mg by mouth at bedtime.  05/24/14   Historical Provider, MD  dolutegravir (TIVICAY) 50 MG tablet Take 50 mg by mouth at bedtime.  07/31/15   Historical Provider, MD  ferrous sulfate 325 (65 FE) MG tablet Take 325 mg by mouth 2 (two) times daily. 01/10/15 01/10/16  Historical Provider, MD  folic acid (FOLVITE) 1 MG tablet Take 1 mg by mouth every evening.     Historical Provider, MD  HYDROcodone-acetaminophen (NORCO/VICODIN) 5-325 MG tablet Take 1 tablet by mouth See admin instructions. Take 1 tablet by mouth at bedtime if needed for pain, and Take 1 tablet by mouth 30 min before physical therapy. 10/18/15   Historical Provider, MD  isosorbide mononitrate (IMDUR) 30 MG 24 hr tablet Take 1 tablet (30 mg total) by mouth daily. 03/29/16   Theodoro Grist, MD  levofloxacin (LEVAQUIN) 500 MG tablet Take 1 tablet (500 mg total) by mouth daily. 08/08/16 08/18/16  Earleen Newport, MD  losartan (COZAAR) 100 MG tablet Take 100 mg by mouth every morning.  10/31/15   Historical Provider, MD  omeprazole (PRILOSEC) 40 MG capsule Take 40 mg by mouth every morning.  11/06/15   Historical Provider, MD  predniSONE (DELTASONE) 50 MG tablet Take 1 tablet (50 mg total) by mouth daily with breakfast. 08/12/16   Lavonia Drafts, MD  sertraline (ZOLOFT) 50 MG tablet Take 50 mg by mouth every evening.     Historical Provider, MD  torsemide (DEMADEX) 20 MG tablet Take 1 tablet (20 mg total) by mouth daily. 03/28/16   Theodoro Grist, MD  vitamin B-12 (CYANOCOBALAMIN) 1000 MCG tablet Take 1,000 mcg by mouth daily.    Historical Provider, MD     Allergies Buchu-cornsilk-ch grass-hydran; Colchicine; Nifedipine; Oxycodone-acetaminophen; and Tramadol  Family History  Problem Relation Age of Onset  . Hypertension Mother   . Cancer Father   . Heart disease Sister   .  Diabetes Brother   . Anesthesia problems Neg Hx     Social History Social History  Substance Use Topics  . Smoking status: Former Research scientist (life sciences)  . Smokeless tobacco: Former Systems developer    Quit date: 03/13/1972  . Alcohol use No    Review of Systems  Constitutional: No fever/chills Eyes: No visual changes.   Cardiovascular: Chest wall pain Respiratory: As above Gastrointestinal: No abdominal pain.  No nausea, no vomiting.    Musculoskeletal: Negative for back pain. Skin: Negative for rash. Neurological: Negative for headaches   10-point ROS otherwise negative.  ____________________________________________   PHYSICAL EXAM:  VITAL SIGNS: ED Triage Vitals  Enc Vitals Group     BP 08/12/16 1421 131/74     Pulse Rate 08/12/16 1421 71     Resp 08/12/16 1421 18     Temp 08/12/16 1421 98.6 F (37 C)     Temp Source 08/12/16 1421 Oral     SpO2 08/12/16 1421 96 %     Weight 08/12/16 1421 220 lb (99.8 kg)     Height 08/12/16 1421 5\' 11"  (1.803 m)     Head Circumference --      Peak Flow --      Pain Score 08/12/16  1536 9     Pain Loc --      Pain Edu? --      Excl. in [place]? --     Constitutional: Alert and oriented.  Pleasant and interactive Eyes: Conjunctivae are normal.   Nose: No congestion/rhinnorhea. Mouth/Throat: Mucous membranes are moist.    Cardiovascular: Normal rate, regular rhythm. Grossly normal heart sounds.  Good peripheral circulation. Respiratory: Mildly increased respiratory effort, no retractions, scattered moderate wheezes Gastrointestinal: Soft and nontender. No distention.  No CVA tenderness. Genitourinary: deferred Musculoskeletal: No lower extremity tenderness nor edema.  Warm and well perfused Neurologic:  Normal speech and language. No gross focal neurologic deficits are appreciated.  Skin:  Skin is warm, dry and intact. No rash noted. Psychiatric: Mood and affect are normal. Speech and behavior are normal.  ____________________________________________   LABS (all labs ordered are listed, but only abnormal results are displayed)  Labs Reviewed  CBC - Abnormal; Notable for the following:       Result Value   RBC 4.37 (*)    Platelets 132 (*)    All other components within normal limits  COMPREHENSIVE METABOLIC PANEL - Abnormal; Notable for the following:    Glucose, Bld 145 (*)    BUN 43 (*)    Creatinine, Ser 2.68 (*)    AST 63 (*)    ALT 73 (*)    GFR calc non Af Amer 24 (*)    GFR calc Af Amer 27 (*)    All other components within normal limits  TROPONIN I  BRAIN NATRIURETIC PEPTIDE   ____________________________________________  EKG  ED ECG REPORT I, Lavonia Drafts, the attending physician, personally viewed and interpreted this ECG.  Date: 08/12/2016 EKG Time: 2:34 PM Rate: 72 Rhythm: normal sinus rhythm QRS Axis: normal Intervals: normal ST/T Wave abnormalities: normal Conduction Disturbances: Incomplete right bundle-branch Narrative Interpretation: unremarkable  ____________________________________________  RADIOLOGY  Chest x-ray  normal ____________________________________________   PROCEDURES  Procedure(s) performed: No    Critical Care performed: No ____________________________________________   INITIAL IMPRESSION / ASSESSMENT AND PLAN / ED COURSE  Pertinent labs & imaging results that were available during my care of the patient were reviewed by me and considered in my medical decision making (see chart for  details).  Patient presents with severe cough and wheezing. We'll treat with DuoNeb and Solu-Medrol, recheck labs and x-ray and reevaluate.     Patient felt significantly improved after DuoNeb and steroids. Coughing is much improved. X-ray labs are unremarkable. Patient has no interest in staying in the hospital. We will treat with steroids and inhaler with close PCP follow-up. Return precautions discussed ____________________________________________   FINAL CLINICAL IMPRESSION(S) / ED DIAGNOSES  Final diagnoses:  Bronchitis      NEW MEDICATIONS STARTED DURING THIS VISIT:  Discharge Medication List as of 08/12/2016  5:31 PM    START taking these medications   Details  albuterol (PROVENTIL HFA;VENTOLIN HFA) 108 (90 Base) MCG/ACT inhaler Inhale 2 puffs into the lungs every 6 (six) hours as needed for wheezing or shortness of breath., Starting Wed 08/12/2016, Print    predniSONE (DELTASONE) 50 MG tablet Take 1 tablet (50 mg total) by mouth daily with breakfast., Starting Wed 08/12/2016, Print         Note:  This document was prepared using Dragon voice recognition software and may include unintentional dictation errors.    Lavonia Drafts, MD 08/12/16 [phone]

## 2016-09-22 ENCOUNTER — Other Ambulatory Visit: Payer: Self-pay | Admitting: Physical Medicine and Rehabilitation

## 2016-09-22 DIAGNOSIS — M5412 Radiculopathy, cervical region: Secondary | ICD-10-CM

## 2016-10-05 ENCOUNTER — Ambulatory Visit
Admission: RE | Admit: 2016-10-05 | Discharge: 2016-10-05 | Disposition: A | Discharge status: Home / Self Care | Payer: Medicare Other | Source: Ambulatory Visit | Attending: Physical Medicine and Rehabilitation | Admitting: Physical Medicine and Rehabilitation

## 2016-10-05 DIAGNOSIS — M4802 Spinal stenosis, cervical region: Secondary | ICD-10-CM | POA: Diagnosis not present

## 2016-10-05 DIAGNOSIS — M4712 Other spondylosis with myelopathy, cervical region: Secondary | ICD-10-CM | POA: Diagnosis present

## 2016-10-05 DIAGNOSIS — M50223 Other cervical disc displacement at C6-C7 level: Secondary | ICD-10-CM | POA: Insufficient documentation

## 2016-10-05 DIAGNOSIS — M5412 Radiculopathy, cervical region: Secondary | ICD-10-CM

## 2016-10-06 ENCOUNTER — Emergency Department
Admission: EM | Admit: 2016-10-06 | Discharge: 2016-10-06 | Disposition: A | Discharge status: Home / Self Care | Payer: Medicare Other | Attending: Emergency Medicine | Admitting: Emergency Medicine

## 2016-10-06 DIAGNOSIS — M5441 Lumbago with sciatica, right side: Secondary | ICD-10-CM | POA: Insufficient documentation

## 2016-10-06 DIAGNOSIS — I2510 Atherosclerotic heart disease of native coronary artery without angina pectoris: Secondary | ICD-10-CM | POA: Insufficient documentation

## 2016-10-06 DIAGNOSIS — I5033 Acute on chronic diastolic (congestive) heart failure: Secondary | ICD-10-CM | POA: Diagnosis not present

## 2016-10-06 DIAGNOSIS — Z21 Asymptomatic human immunodeficiency virus [HIV] infection status: Secondary | ICD-10-CM | POA: Diagnosis not present

## 2016-10-06 DIAGNOSIS — N183 Chronic kidney disease, stage 3 (moderate): Secondary | ICD-10-CM | POA: Diagnosis not present

## 2016-10-06 DIAGNOSIS — I130 Hypertensive heart and chronic kidney disease with heart failure and stage 1 through stage 4 chronic kidney disease, or unspecified chronic kidney disease: Secondary | ICD-10-CM | POA: Insufficient documentation

## 2016-10-06 DIAGNOSIS — I252 Old myocardial infarction: Secondary | ICD-10-CM | POA: Diagnosis not present

## 2016-10-06 DIAGNOSIS — M545 Low back pain: Secondary | ICD-10-CM | POA: Diagnosis present

## 2016-10-06 DIAGNOSIS — Z87891 Personal history of nicotine dependence: Secondary | ICD-10-CM | POA: Diagnosis not present

## 2016-10-06 DIAGNOSIS — Z7982 Long term (current) use of aspirin: Secondary | ICD-10-CM | POA: Insufficient documentation

## 2016-10-06 DIAGNOSIS — Z79899 Other long term (current) drug therapy: Secondary | ICD-10-CM | POA: Diagnosis not present

## 2016-10-06 DIAGNOSIS — G8929 Other chronic pain: Secondary | ICD-10-CM

## 2016-10-06 MED ORDER — DEXAMETHASONE SODIUM PHOSPHATE 10 MG/ML IJ SOLN
10.0000 mg | Freq: Once | INTRAMUSCULAR | Status: AC
  Administered 2016-10-06: 10 mg via INTRAMUSCULAR
  Filled 2016-10-06: qty 1

## 2016-10-06 NOTE — ED Provider Notes (Signed)
[facility] Emergency Department Provider Note   ____________________________________________   I have reviewed the triage vital signs and the nursing notes.   HISTORY  Chief Complaint Back Pain and Neck Pain    HPI Jared Walker is a 66 y.o. male presents with lumbar back pain and right lower extremity pain.  Patient reports long history of lumbar pain related to degenerative joint, disc disease and chronic back pain. Patient is followed by the pain clinic and recently had a cervical spine MRI. Pt is scheduled for a follow up appointment with his doctor however his current pain has worsened in his back and leg effecting his mobility bring him to the emergency department. He denies bowel/bladder dysfunction or saddle anesthesia. Patient denies numbness or tingling in the right lower extremity. Patient notes unsteadiness and difficulty walking secondary to increased back and right lower extremity pain.    Past Medical History:  Diagnosis Date  . Anemia   . Aortic stenosis, mild    mild AS by echo, 08/2011  . Chronic kidney disease    kidney stones  . Coronary artery disease   . Depression   . Full dentures    upper and lower  . Gout   . Heart murmur   . Hep C w/o coma, chronic ([place]) 01/19/2015  . Hepatitis C 09/25/11  . HIV positive ([place]) 09/25/11  . Hypertension   . MI (myocardial infarction) [facility])     Patient Active Problem List   Diagnosis Date Noted  . Dyspnea 03/28/2016  . Elevated troponin 03/28/2016  . Acute on chronic systolic CHF (congestive heart failure) ([place]) 03/28/2016  . Swelling of lower extremity 03/28/2016  . HIV disease ([place]) 03/28/2016  . Acute on chronic diastolic CHF (congestive heart failure) ([place])   . Cirrhosis of liver without ascites ([place])   . Chronic renal failure in pediatric patient, stage 3 (moderate)   . BP (high blood pressure) 12/11/2015  . Acute non-ST elevation myocardial infarction (NSTEMI) ([place])  11/13/2015  . Chest pain 11/09/2015  . Bradycardia 11/09/2015  . Abnormal EKG 11/09/2015  . Iron deficiency anemia 08/27/2015  . Musculoskeletal chest pain 01/19/2015  . Anemia 01/19/2015  . GI bleed 01/19/2015  . Pica in adults 01/19/2015  . CKD (chronic kidney disease), stage III 01/19/2015  . HIV (human immunodeficiency virus infection) ([place]) 01/19/2015  . HTN (hypertension) 01/19/2015  . Chronic hepatitis C without hepatic coma ([place]) 01/19/2015  . CKD (chronic kidney disease) 01/19/2015  . History of arthroscopy of shoulder 11/13/2014    Past Surgical History:  Procedure Laterality Date  . BACK SURGERY    . CARDIOVASCULAR STRESS TEST     at [facility]  . CHOLECYSTECTOMY    . CLOSED REDUCTION PELVIC FRACTURE    . ESOPHAGOGASTRODUODENOSCOPY (EGD) WITH PROPOFOL N/A 01/21/2015   Procedure: ESOPHAGOGASTRODUODENOSCOPY (EGD) WITH PROPOFOL;  Surgeon: Manya Silvas, MD;  Location: [facility] ENDOSCOPY;  Service: Endoscopy;  Laterality: N/A;  . ESOPHAGOGASTRODUODENOSCOPY (EGD) WITH PROPOFOL N/A 09/04/2015   Procedure: ESOPHAGOGASTRODUODENOSCOPY (EGD) WITH PROPOFOL;  Surgeon: Manya Silvas, MD;  Location: [facility] ENDOSCOPY;  Service: Endoscopy;  Laterality: N/A;  . FRACTURE SURGERY    . KNEE ARTHROSCOPY  2009   Right  . LUMBAR LAMINECTOMY/DECOMPRESSION MICRODISCECTOMY  09/25/2011   Procedure: LUMBAR LAMINECTOMY/DECOMPRESSION MICRODISCECTOMY;  Surgeon: Erline Levine, MD;  Location: [place] NEURO ORS;  Service: Neurosurgery;  Laterality: Left;  Left Lumbar Four-Five Microdiskectomy  . NEPHROSTOMY     tube Left  . SHOULDER ARTHROSCOPY WITH  OPEN ROTATOR CUFF REPAIR Right 10/12/2014   Procedure: SHOULDER ARTHROSCOP with decompression;  Surgeon: Leanor Kail, MD;  Location: [place];  Service: Orthopedics;  Laterality: Right;    Prior to Admission medications   Medication Sig Start Date End Date Taking? Authorizing Provider  albuterol (PROVENTIL HFA;VENTOLIN HFA) 108 (90 Base) MCG/ACT inhaler  Inhale 2 puffs into the lungs every 6 (six) hours as needed for wheezing or shortness of breath. 08/12/16   Lavonia Drafts, MD  allopurinol (ZYLOPRIM) 100 MG tablet Take 200 mg by mouth daily. *Take along with 300 mg tablet to equal total dose of 500 mg daily.*    [provider]  amLODipine (NORVASC) 10 MG tablet Take 10 mg by mouth. 11/13/15 11/12/16  [provider]  aspirin EC 81 MG EC tablet Take 1 tablet (81 mg total) by mouth daily. 11/11/15   Bettey Costa, MD  atorvastatin (LIPITOR) 10 MG tablet Take 80 mg by mouth. 11/13/15 12/13/15  [provider]  carvedilol (COREG) 12.5 MG tablet Take 12.5 mg by mouth 2 (two) times daily with a meal.    [provider]  chlorpheniramine-HYDROcodone (TUSSIONEX PENNKINETIC ER) 10-8 MG/5ML SUER Take 5 mLs by mouth 2 (two) times daily. 08/08/16   Earleen Newport, MD  Cholecalciferol (VITAMIN D-1000 MAX ST) 1000 units tablet Take 1,000 Units by mouth daily.    [provider]  clopidogrel (PLAVIX) 75 MG tablet Take 75 mg by mouth daily.    [provider]  DESCOVY 200-25 MG tablet Take 1 tablet by mouth at bedtime.  08/12/15   [provider]  diazepam (VALIUM) 5 MG tablet Take 5 mg by mouth at bedtime. For sleep    [provider]  docusate sodium (COLACE) 100 MG capsule Take 100 mg by mouth at bedtime.  05/24/14   [provider]  dolutegravir (TIVICAY) 50 MG tablet Take 50 mg by mouth at bedtime.  07/31/15   [provider]  ferrous sulfate 325 (65 FE) MG tablet Take 325 mg by mouth 2 (two) times daily. 01/10/15 01/10/16  [provider]  folic acid (FOLVITE) 1 MG tablet Take 1 mg by mouth every evening.     [provider]  HYDROcodone-acetaminophen (NORCO/VICODIN) 5-325 MG tablet Take 1 tablet by mouth See admin instructions. Take 1 tablet by mouth at bedtime if needed for pain, and Take 1 tablet by mouth 30 min before physical therapy. 10/18/15   [provider]  isosorbide mononitrate (IMDUR) 30 MG 24 hr tablet Take 1 tablet (30 mg total) by mouth daily. 03/29/16   Theodoro Grist, MD  losartan (COZAAR) 100 MG tablet Take 100 mg by mouth every morning.  10/31/15   [provider]  omeprazole (PRILOSEC) 40 MG capsule Take 40 mg by mouth every morning.  11/06/15   [provider]  predniSONE (DELTASONE) 50 MG tablet Take 1 tablet (50 mg total) by mouth daily with breakfast. 08/12/16   Lavonia Drafts, MD  sertraline (ZOLOFT) 50 MG tablet Take 50 mg by mouth every evening.     [provider]  torsemide (DEMADEX) 20 MG tablet Take 1 tablet (20 mg total) by mouth daily. 03/28/16   Theodoro Grist, MD  vitamin B-12 (CYANOCOBALAMIN) 1000 MCG tablet Take 1,000 mcg by mouth daily.    [provider]    Allergies Buchu-cornsilk-ch grass-hydran; Colchicine; Nifedipine; Oxycodone-acetaminophen; and Tramadol  Family History  Problem Relation Age of Onset  . Hypertension Mother   . Cancer  Father   . Heart disease Sister   . Diabetes Brother   . Anesthesia problems Neg Hx     Social History Social History  Substance Use Topics  . Smoking status: Former Research scientist (life sciences)  . Smokeless tobacco: Former Systems developer    Quit date: 03/13/1972  . Alcohol use No    Review of Systems Constitutional: No fever/chills Cardiovascular: Denies chest pain. Respiratory: Denies cough Gastrointestinal: No abdominal pain.  No nausea, no vomiting.   Genitourinary: Negative bowel or bladder dysfunction.  Musculoskeletal: Back and righ lower extremity pain. Unsteady gait related to increased pain Skin: Negative for rash. Neurological: Negative for headaches; negative saddle anesthesia.   ____________________________________________   PHYSICAL EXAM:  VITAL SIGNS: ED Triage Vitals  Enc Vitals Group     BP 10/06/16 1452 122/75     Pulse Rate 10/06/16 1452 90     Resp 10/06/16 1452 15     Temp 10/06/16 1452 98.4 F (36.9 C)     Temp  Source 10/06/16 1452 Oral     SpO2 10/06/16 1452 96 %     Weight 10/06/16 1453 210 lb (95.3 kg)     Height 10/06/16 1453 5\' 11"  (1.803 m)     Head Circumference --      Peak Flow --      Pain Score 10/06/16 1452 10     Pain Loc --      Pain Edu? --      Excl. in [place]? --     Constitutional: Alert and oriented. Well appearing and in no acute distress. Head: Atraumatic. Cardiovascular: Normal rate, regular rhythm.  Good peripheral circulation. Respiratory: Normal respiratory effort.   Genitourinary: deferred Musculoskeletal: Gross lower extremity strength intact however right lower extremity limited by severe pain. No joint effusion noted.  Neurologic:  Normal speech and language. Left lower extremity radicular pain. Left lower extremity sensation intact. No bowel/bladder dysfunction or saddle anesthesia noted. Skin:  Skin is warm, dry and intact. No rash noted. Psychiatric: Mood and affect are normal. Speech and behavior are normal.  ____________________________________________   LABS (all labs ordered are listed, but only abnormal results are displayed)  Labs Reviewed - No data to display ____________________________________________  EKG None  ____________________________________________  RADIOLOGY None ____________________________________________   PROCEDURES  Procedure(s) performed: no    Critical Care performed: no ____________________________________________   INITIAL IMPRESSION / ASSESSMENT AND PLAN / ED COURSE  Pertinent labs & imaging results that were available during my care of the patient were reviewed by me and considered in my medical decision making (see chart for details).  Patient symptoms consistent with acute inflammation of chronic lumbar back pain with right side sciatic symptoms. No bowel/bladder dysfunction or saddle anesthesia noted on exam. Physical exam findings are reassuring no acute neurological changes are contributing to patient  symptoms. Patient given Decadron 10 mg IV during ED course encouraged to modify activities until his follow up appointment with PCP and pain management.  Patient informed of clinical course, understand medical decision-making process, and agree with plan.      ____________________________________________   FINAL CLINICAL IMPRESSION(S) / ED DIAGNOSES  Final diagnoses:  Chronic right-sided low back pain with right-sided sciatica      NEW MEDICATIONS STARTED DURING THIS VISIT:  Discharge Medication List as of 10/06/2016  4:03 PM       Note:  This document was prepared using Dragon voice recognition software and may include unintentional dictation errors.   Kyelle Urbas, Laroy Apple, PA-C 10/06/16 2230  Lavonia Drafts, MD 10/07/16 1302

## 2016-10-06 NOTE — ED Notes (Signed)
Pt to ed with c/o neck and back pain. Pt states he has chronic back pain but that this is worse.  Pt reports increased pain with all movement and standing.  Pt reports sitting and laying is better,  Denies new injury.

## 2016-10-06 NOTE — ED Triage Notes (Signed)
Pt c/o neck and back pain - he states he had MRI yesterday and they called and told him that he had a pinched nerve and would have to go see a surgeon - pt already goes to the pain clinic for bulging disc and pinched nerves - pt reports that today is is unable to ambulate as he usually can and is experiencing pain in right leg - pt is here for pain control

## 2016-11-30 ENCOUNTER — Emergency Department: Payer: Medicare Other

## 2016-11-30 ENCOUNTER — Encounter: Payer: Self-pay | Admitting: Emergency Medicine

## 2016-11-30 ENCOUNTER — Inpatient Hospital Stay
Admission: EM | Admit: 2016-11-30 | Discharge: 2016-12-01 | DRG: 091 | Disposition: A | Discharge status: Home / Self Care | Payer: Medicare Other | Attending: Internal Medicine | Admitting: Internal Medicine

## 2016-11-30 DIAGNOSIS — I5043 Acute on chronic combined systolic (congestive) and diastolic (congestive) heart failure: Secondary | ICD-10-CM | POA: Diagnosis present

## 2016-11-30 DIAGNOSIS — G8929 Other chronic pain: Secondary | ICD-10-CM | POA: Diagnosis present

## 2016-11-30 DIAGNOSIS — Z888 Allergy status to other drugs, medicaments and biological substances status: Secondary | ICD-10-CM | POA: Diagnosis not present

## 2016-11-30 DIAGNOSIS — T426X5A Adverse effect of other antiepileptic and sedative-hypnotic drugs, initial encounter: Secondary | ICD-10-CM | POA: Diagnosis present

## 2016-11-30 DIAGNOSIS — Z87891 Personal history of nicotine dependence: Secondary | ICD-10-CM

## 2016-11-30 DIAGNOSIS — B20 Human immunodeficiency virus [HIV] disease: Secondary | ICD-10-CM | POA: Diagnosis present

## 2016-11-30 DIAGNOSIS — I2510 Atherosclerotic heart disease of native coronary artery without angina pectoris: Secondary | ICD-10-CM | POA: Diagnosis present

## 2016-11-30 DIAGNOSIS — T428X5A Adverse effect of antiparkinsonism drugs and other central muscle-tone depressants, initial encounter: Secondary | ICD-10-CM | POA: Diagnosis present

## 2016-11-30 DIAGNOSIS — N184 Chronic kidney disease, stage 4 (severe): Secondary | ICD-10-CM | POA: Diagnosis present

## 2016-11-30 DIAGNOSIS — Z8619 Personal history of other infectious and parasitic diseases: Secondary | ICD-10-CM

## 2016-11-30 DIAGNOSIS — Z79899 Other long term (current) drug therapy: Secondary | ICD-10-CM | POA: Diagnosis not present

## 2016-11-30 DIAGNOSIS — G92 Toxic encephalopathy: Principal | ICD-10-CM | POA: Diagnosis present

## 2016-11-30 DIAGNOSIS — Z8249 Family history of ischemic heart disease and other diseases of the circulatory system: Secondary | ICD-10-CM

## 2016-11-30 DIAGNOSIS — Z885 Allergy status to narcotic agent status: Secondary | ICD-10-CM | POA: Diagnosis not present

## 2016-11-30 DIAGNOSIS — M545 Low back pain: Secondary | ICD-10-CM | POA: Diagnosis present

## 2016-11-30 DIAGNOSIS — G9340 Encephalopathy, unspecified: Secondary | ICD-10-CM | POA: Diagnosis present

## 2016-11-30 DIAGNOSIS — R531 Weakness: Secondary | ICD-10-CM

## 2016-11-30 LAB — URINALYSIS, COMPLETE (UACMP) WITH MICROSCOPIC
BACTERIA UA: NONE SEEN
Bilirubin Urine: NEGATIVE
GLUCOSE, UA: NEGATIVE mg/dL
Hgb urine dipstick: NEGATIVE
KETONES UR: NEGATIVE mg/dL
Leukocytes, UA: NEGATIVE
Nitrite: NEGATIVE
PROTEIN: NEGATIVE mg/dL
RBC / HPF: NONE SEEN RBC/hpf (ref 0–5)
SQUAMOUS EPITHELIAL / LPF: NONE SEEN
Specific Gravity, Urine: 1.013 (ref 1.005–1.030)
pH: 5 (ref 5.0–8.0)

## 2016-11-30 LAB — COMPREHENSIVE METABOLIC PANEL
ALK PHOS: 57 U/L (ref 38–126)
ALT: 34 U/L (ref 17–63)
AST: 34 U/L (ref 15–41)
Albumin: 4.3 g/dL (ref 3.5–5.0)
Anion gap: 10 (ref 5–15)
BUN: 32 mg/dL — AB (ref 6–20)
CALCIUM: 10.1 mg/dL (ref 8.9–10.3)
CHLORIDE: 106 mmol/L (ref 101–111)
CO2: 23 mmol/L (ref 22–32)
CREATININE: 2.95 mg/dL — AB (ref 0.61–1.24)
GFR calc Af Amer: 24 mL/min — ABNORMAL LOW (ref >60)
GFR calc non Af Amer: 21 mL/min — ABNORMAL LOW (ref >60)
Glucose, Bld: 132 mg/dL — ABNORMAL HIGH (ref 65–99)
Potassium: 4.4 mmol/L (ref 3.5–5.1)
SODIUM: 139 mmol/L (ref 135–145)
Total Bilirubin: 1.1 mg/dL (ref 0.3–1.2)
Total Protein: 8.7 g/dL — ABNORMAL HIGH (ref 6.5–8.1)

## 2016-11-30 LAB — CBC
HEMATOCRIT: 37.4 % — AB (ref 40.0–52.0)
Hemoglobin: 12.7 g/dL — ABNORMAL LOW (ref 13.0–18.0)
MCH: 32 pg (ref 26.0–34.0)
MCHC: 33.9 g/dL (ref 32.0–36.0)
MCV: 94.2 fL (ref 80.0–100.0)
PLATELETS: 149 10*3/uL — AB (ref 150–440)
RBC: 3.97 MIL/uL — AB (ref 4.40–5.90)
RDW: 13.8 % (ref 11.5–14.5)
WBC: 10.7 10*3/uL — AB (ref 3.8–10.6)

## 2016-11-30 LAB — DIFFERENTIAL
BASOS ABS: 0 10*3/uL (ref 0–0.1)
BASOS PCT: 0 %
Eosinophils Absolute: 0.2 10*3/uL (ref 0–0.7)
Eosinophils Relative: 2 %
LYMPHS PCT: 16 %
Lymphs Abs: 1.7 10*3/uL (ref 1.0–3.6)
Monocytes Absolute: 0.9 10*3/uL (ref 0.2–1.0)
Monocytes Relative: 8 %
NEUTROS ABS: 7.9 10*3/uL — AB (ref 1.4–6.5)
Neutrophils Relative %: 74 %

## 2016-11-30 LAB — PROTIME-INR
INR: 1.17
PROTHROMBIN TIME: 15 s (ref 11.4–15.2)

## 2016-11-30 LAB — GLUCOSE, CAPILLARY: Glucose-Capillary: 142 mg/dL — ABNORMAL HIGH (ref 65–99)

## 2016-11-30 LAB — TROPONIN I: Troponin I: <0.03 ng/mL (ref <0.03)

## 2016-11-30 LAB — AMMONIA: Ammonia: 22 umol/L (ref 9–35)

## 2016-11-30 LAB — APTT: APTT: 33 s (ref 24–36)

## 2016-11-30 MED ORDER — ALLOPURINOL 100 MG PO TABS
200.0000 mg | ORAL_TABLET | Freq: Every day | ORAL | Status: DC
Start: 2016-11-30 — End: 2016-11-30

## 2016-11-30 MED ORDER — ISOSORBIDE MONONITRATE ER 30 MG PO TB24
30.0000 mg | ORAL_TABLET | Freq: Every day | ORAL | Status: DC
  Administered 2016-12-01: 09:00:00 30 mg via ORAL
  Filled 2016-11-30: qty 1

## 2016-11-30 MED ORDER — SERTRALINE HCL 50 MG PO TABS: 50.0000 mg | ORAL_TABLET | Freq: Every evening | ORAL | Status: DC

## 2016-11-30 MED ORDER — EMTRICITABINE-TENOFOVIR AF 200-25 MG PO TABS
1.0000 | ORAL_TABLET | Freq: Every day | ORAL | Status: DC
  Administered 2016-11-30: 1 via ORAL
  Filled 2016-11-30 (×2): qty 1

## 2016-11-30 MED ORDER — VITAMIN B-12 1000 MCG PO TABS
1000.0000 ug | ORAL_TABLET | Freq: Every day | ORAL | Status: DC
  Administered 2016-12-01: 1000 ug via ORAL
  Filled 2016-11-30: qty 1

## 2016-11-30 MED ORDER — ONDANSETRON HCL 4 MG/2ML IJ SOLN: 4.0000 mg | Freq: Four times a day (QID) | INTRAMUSCULAR | Status: DC | PRN

## 2016-11-30 MED ORDER — ALLOPURINOL 100 MG PO TABS
200.0000 mg | ORAL_TABLET | Freq: Every day | ORAL | Status: DC
  Administered 2016-12-01: 09:00:00 200 mg via ORAL
  Filled 2016-11-30: qty 2

## 2016-11-30 MED ORDER — ACETAMINOPHEN 325 MG PO TABS
650.0000 mg | ORAL_TABLET | Freq: Four times a day (QID) | ORAL | Status: DC | PRN
  Administered 2016-11-30 – 2016-12-01 (×2): 650 mg via ORAL
  Filled 2016-11-30 (×2): qty 2

## 2016-11-30 MED ORDER — ENOXAPARIN SODIUM 40 MG/0.4ML ~~LOC~~ SOLN
40.0000 mg | SUBCUTANEOUS | Status: DC
  Administered 2016-11-30: 23:00:00 40 mg via SUBCUTANEOUS
  Filled 2016-11-30: qty 0.4

## 2016-11-30 MED ORDER — DIAZEPAM 5 MG PO TABS
5.0000 mg | ORAL_TABLET | Freq: Every day | ORAL | Status: DC
  Administered 2016-11-30: 23:00:00 5 mg via ORAL
  Filled 2016-11-30: qty 1

## 2016-11-30 MED ORDER — ACETAMINOPHEN 650 MG RE SUPP: 650.0000 mg | Freq: Four times a day (QID) | RECTAL | Status: DC | PRN

## 2016-11-30 MED ORDER — DOCUSATE SODIUM 100 MG PO CAPS
100.0000 mg | ORAL_CAPSULE | Freq: Every day | ORAL | Status: DC
  Filled 2016-11-30: qty 1

## 2016-11-30 MED ORDER — CARVEDILOL 25 MG PO TABS
12.5000 mg | ORAL_TABLET | Freq: Two times a day (BID) | ORAL | Status: DC
  Administered 2016-12-01: 08:00:00 12.5 mg via ORAL
  Filled 2016-11-30: qty 1

## 2016-11-30 MED ORDER — ASPIRIN EC 81 MG PO TBEC
81.0000 mg | DELAYED_RELEASE_TABLET | Freq: Every day | ORAL | Status: DC
  Administered 2016-12-01: 09:00:00 81 mg via ORAL
  Filled 2016-11-30: qty 1

## 2016-11-30 MED ORDER — PANTOPRAZOLE SODIUM 40 MG PO TBEC
40.0000 mg | DELAYED_RELEASE_TABLET | Freq: Every day | ORAL | Status: DC
  Administered 2016-12-01: 40 mg via ORAL
  Filled 2016-11-30: qty 1

## 2016-11-30 MED ORDER — VITAMIN D 1000 UNITS PO TABS
1000.0000 [IU] | ORAL_TABLET | Freq: Every day | ORAL | Status: DC
  Administered 2016-12-01: 1000 [IU] via ORAL
  Filled 2016-11-30: qty 1

## 2016-11-30 MED ORDER — HYDROCODONE-ACETAMINOPHEN 5-325 MG PO TABS
1.0000 | ORAL_TABLET | ORAL | Status: DC | PRN
  Administered 2016-11-30 – 2016-12-01 (×3): 1 via ORAL
  Filled 2016-11-30 (×4): qty 1

## 2016-11-30 MED ORDER — ONDANSETRON HCL 4 MG PO TABS: 4.0000 mg | ORAL_TABLET | Freq: Four times a day (QID) | ORAL | Status: DC | PRN

## 2016-11-30 MED ORDER — FOLIC ACID 1 MG PO TABS
1.0000 mg | ORAL_TABLET | Freq: Every evening | ORAL | Status: DC
  Administered 2016-11-30: 23:00:00 1 mg via ORAL
  Filled 2016-11-30: qty 1

## 2016-11-30 MED ORDER — ALLOPURINOL 300 MG PO TABS
300.0000 mg | ORAL_TABLET | Freq: Every day | ORAL | Status: DC
  Administered 2016-12-01: 09:00:00 300 mg via ORAL
  Filled 2016-11-30: qty 1

## 2016-11-30 MED ORDER — LOSARTAN POTASSIUM 50 MG PO TABS: 100.0000 mg | ORAL_TABLET | ORAL | Status: DC

## 2016-11-30 MED ORDER — DOLUTEGRAVIR SODIUM 50 MG PO TABS
50.0000 mg | ORAL_TABLET | Freq: Every day | ORAL | Status: DC
  Administered 2016-11-30: 50 mg via ORAL
  Filled 2016-11-30 (×2): qty 1

## 2016-11-30 MED ORDER — TORSEMIDE 20 MG PO TABS
20.0000 mg | ORAL_TABLET | Freq: Every day | ORAL | Status: DC
  Administered 2016-12-01: 09:00:00 20 mg via ORAL
  Filled 2016-11-30: qty 1

## 2016-11-30 MED ORDER — CLOPIDOGREL BISULFATE 75 MG PO TABS
75.0000 mg | ORAL_TABLET | Freq: Every day | ORAL | Status: DC
  Administered 2016-12-01: 09:00:00 75 mg via ORAL
  Filled 2016-11-30: qty 1

## 2016-11-30 NOTE — ED Provider Notes (Addendum)
[facility] Emergency Department Provider Note  ____________________________________________   I have reviewed the triage vital signs and the nursing notes.   HISTORY  Chief Complaint Weakness and Headache    HPI Jared Walker is a 66 y.o. male with a history of HIV no viral load detected and normal white count with a reassuring CD4 counts compliant with his medications also has multiple different other mental problems including chronic pain, chronic sciatica bilateral lower showed his, pain clinic management thereof with recent trip there and new Lyrica and baclofen, CHF on torsemide presents today with multiple different complaints. He states that since this started the baclofen and Lyrica last week, he has been getting gradually more generally weak. He noticed a twitch in his right upper extremity on Friday as he was going to the beach. Then once he got there, he spent the entire weekend sleepy and in bed. He stopped taking his torsemide because he found that he could not get to the bathroom in time. He did not have any actual incontinence. He does admit to dysuria. He denies any fevers but he felt somewhat sweaty while at the beach in the hotel room. He denies any diarrhea. Had a normal bowel movement. Denies any rectal incontinence. Denies any focal numbness or weakness and states his whole body feels weak. he's had a mild headache for the last few weeks, this is a chronic headache for him, no meningismus. He states that he has had a cough, which is nonproductive and for which she has taken cough medication.  In short therefore, patient is here for dysuria, urinary burning, generalized weakness, twitch in the right upper extremity, mild headache, and cough. He also states he is too weak to hold his food and has not been eating or drinking very much.    Past Medical History:  Diagnosis Date  . Anemia   . Aortic stenosis, mild    mild AS by echo, 08/2011  .  Chronic kidney disease    kidney stones  . Coronary artery disease   . Depression   . Full dentures    upper and lower  . Gout   . Heart murmur   . Hep C w/o coma, chronic ([place]) 01/19/2015  . Hepatitis C 09/25/11  . HIV positive ([place]) 09/25/11  . Hypertension   . MI (myocardial infarction) [facility])     Patient Active Problem List   Diagnosis Date Noted  . Dyspnea 03/28/2016  . Elevated troponin 03/28/2016  . Acute on chronic systolic CHF (congestive heart failure) ([place]) 03/28/2016  . Swelling of lower extremity 03/28/2016  . HIV disease ([place]) 03/28/2016  . Acute on chronic diastolic CHF (congestive heart failure) ([place])   . Cirrhosis of liver without ascites ([place])   . Chronic renal failure in pediatric patient, stage 3 (moderate)   . BP (high blood pressure) 12/11/2015  . Acute non-ST elevation myocardial infarction (NSTEMI) ([place]) 11/13/2015  . Chest pain 11/09/2015  . Bradycardia 11/09/2015  . Abnormal EKG 11/09/2015  . Iron deficiency anemia 08/27/2015  . Musculoskeletal chest pain 01/19/2015  . Anemia 01/19/2015  . GI bleed 01/19/2015  . Pica in adults 01/19/2015  . CKD (chronic kidney disease), stage III 01/19/2015  . HIV (human immunodeficiency virus infection) ([place]) 01/19/2015  . HTN (hypertension) 01/19/2015  . Chronic hepatitis C without hepatic coma ([place]) 01/19/2015  . CKD (chronic kidney disease) 01/19/2015  . History of arthroscopy of shoulder 11/13/2014    Past Surgical History:  Procedure  Laterality Date  . BACK SURGERY    . CARDIOVASCULAR STRESS TEST     at [facility]  . CHOLECYSTECTOMY    . CLOSED REDUCTION PELVIC FRACTURE    . ESOPHAGOGASTRODUODENOSCOPY (EGD) WITH PROPOFOL N/A 01/21/2015   Procedure: ESOPHAGOGASTRODUODENOSCOPY (EGD) WITH PROPOFOL;  Surgeon: Manya Silvas, MD;  Location: [facility] ENDOSCOPY;  Service: Endoscopy;  Laterality: N/A;  . ESOPHAGOGASTRODUODENOSCOPY (EGD) WITH PROPOFOL N/A 09/04/2015   Procedure: ESOPHAGOGASTRODUODENOSCOPY (EGD) WITH  PROPOFOL;  Surgeon: Manya Silvas, MD;  Location: [facility] ENDOSCOPY;  Service: Endoscopy;  Laterality: N/A;  . FRACTURE SURGERY    . KNEE ARTHROSCOPY  2009   Right  . LUMBAR LAMINECTOMY/DECOMPRESSION MICRODISCECTOMY  09/25/2011   Procedure: LUMBAR LAMINECTOMY/DECOMPRESSION MICRODISCECTOMY;  Surgeon: Erline Levine, MD;  Location: [facility] NEURO ORS;  Service: Neurosurgery;  Laterality: Left;  Left Lumbar Four-Five Microdiskectomy  . NEPHROSTOMY     tube Left  . SHOULDER ARTHROSCOPY WITH OPEN ROTATOR CUFF REPAIR Right 10/12/2014   Procedure: SHOULDER ARTHROSCOP with decompression;  Surgeon: Leanor Kail, MD;  Location: [place];  Service: Orthopedics;  Laterality: Right;    Prior to Admission medications   Medication Sig Start Date End Date Taking? Authorizing Provider  albuterol (PROVENTIL HFA;VENTOLIN HFA) 108 (90 Base) MCG/ACT inhaler Inhale 2 puffs into the lungs every 6 (six) hours as needed for wheezing or shortness of breath. 08/12/16   Lavonia Drafts, MD  allopurinol (ZYLOPRIM) 100 MG tablet Take 200 mg by mouth daily. *Take along with 300 mg tablet to equal total dose of 500 mg daily.*    [provider]  amLODipine (NORVASC) 10 MG tablet Take 10 mg by mouth. 11/13/15 11/12/16  [provider]  aspirin EC 81 MG EC tablet Take 1 tablet (81 mg total) by mouth daily. 11/11/15   Bettey Costa, MD  atorvastatin (LIPITOR) 10 MG tablet Take 80 mg by mouth. 11/13/15 12/13/15  [provider]  carvedilol (COREG) 12.5 MG tablet Take 12.5 mg by mouth 2 (two) times daily with a meal.    [provider]  chlorpheniramine-HYDROcodone (TUSSIONEX PENNKINETIC ER) 10-8 MG/5ML SUER Take 5 mLs by mouth 2 (two) times daily. 08/08/16   Earleen Newport, MD  Cholecalciferol (VITAMIN D-1000 MAX ST) 1000 units tablet Take 1,000 Units by mouth daily.    [provider]  clopidogrel (PLAVIX) 75 MG tablet Take 75 mg by mouth daily.    [provider]  DESCOVY  200-25 MG tablet Take 1 tablet by mouth at bedtime.  08/12/15   [provider]  diazepam (VALIUM) 5 MG tablet Take 5 mg by mouth at bedtime. For sleep    [provider]  docusate sodium (COLACE) 100 MG capsule Take 100 mg by mouth at bedtime.  05/24/14   [provider]  dolutegravir (TIVICAY) 50 MG tablet Take 50 mg by mouth at bedtime.  07/31/15   [provider]  ferrous sulfate 325 (65 FE) MG tablet Take 325 mg by mouth 2 (two) times daily. 01/10/15 01/10/16  [provider]  folic acid (FOLVITE) 1 MG tablet Take 1 mg by mouth every evening.     [provider]  HYDROcodone-acetaminophen (NORCO/VICODIN) 5-325 MG tablet Take 1 tablet by mouth See admin instructions. Take 1 tablet by mouth at bedtime if needed for pain, and Take 1 tablet by mouth 30 min before physical therapy. 10/18/15   [provider]  isosorbide mononitrate (IMDUR) 30 MG 24 hr tablet Take 1 tablet (30 mg total) by mouth daily.  03/29/16   Theodoro Grist, MD  losartan (COZAAR) 100 MG tablet Take 100 mg by mouth every morning.  10/31/15   [provider]  omeprazole (PRILOSEC) 40 MG capsule Take 40 mg by mouth every morning.  11/06/15   [provider]  predniSONE (DELTASONE) 50 MG tablet Take 1 tablet (50 mg total) by mouth daily with breakfast. 08/12/16   Lavonia Drafts, MD  sertraline (ZOLOFT) 50 MG tablet Take 50 mg by mouth every evening.     [provider]  torsemide (DEMADEX) 20 MG tablet Take 1 tablet (20 mg total) by mouth daily. 03/28/16   Theodoro Grist, MD  vitamin B-12 (CYANOCOBALAMIN) 1000 MCG tablet Take 1,000 mcg by mouth daily.    [provider]    Allergies Buchu-cornsilk-ch grass-hydran; Colchicine; Nifedipine; Oxycodone-acetaminophen; and Tramadol  Family History  Problem Relation Age of Onset  . Hypertension Mother   . Cancer Father   . Heart disease Sister   . Diabetes Brother   . Anesthesia problems Neg Hx      Social History Social History  Substance Use Topics  . Smoking status: Former Research scientist (life sciences)  . Smokeless tobacco: Former Systems developer    Quit date: 03/13/1972  . Alcohol use No    Review of Systems Constitutional: No fever/chills, Positive "felt sweaty" Eyes: No visual changes. ENT: No sore throat. No stiff neck no neck pain Cardiovascular: Denies chest pain. Respiratory: Denies shortness of breath.Positive cough Gastrointestinal:   no vomiting.  No diarrhea.  No constipation. Genitourinary: Positive for dysuria. Musculoskeletal: Negative lower extremity swelling Skin: Negative for rash. Neurological: Negative for severe headaches, focal weakness or numbness.   ____________________________________________   PHYSICAL EXAM:  VITAL SIGNS: ED Triage Vitals  Enc Vitals Group     BP 11/30/16 1620 119/76     Pulse Rate 11/30/16 1620 72     Resp 11/30/16 1620 18     Temp 11/30/16 1620 98.9 F (37.2 C)     Temp Source 11/30/16 1620 Oral     SpO2 11/30/16 1624 98 %     Weight 11/30/16 1620 219 lb (99.3 kg)     Height 11/30/16 1620 5\' 11"  (1.803 m)     Head Circumference --      Peak Flow --      Pain Score 11/30/16 1625 8     Pain Loc --      Pain Edu? --      Excl. in [place]? --     Constitutional: Alert and oriented. Well appearing and in no acute distress. Eyes: Conjunctivae are normal Head: Atraumatic HEENT: No congestion/rhinnorhea. Mucous membranes are moist.  Oropharynx non-erythematous Neck:   Nontender with no meningismus, no masses, no stridor Cardiovascular: Normal rate, regular rhythm. Grossly normal heart sounds.  Good peripheral circulation. Respiratory: Normal respiratory effort.  No retractions. Lungs CTAB. Abdominal: Soft and nontender. No distention. No guarding no rebound Back:  There is no focal tenderness or step off.  there is no midline tenderness there are no lesions noted. there is no CVA tenderness Musculoskeletal: No lower extremity tenderness, no upper  extremity tenderness. No joint effusions, no DVT signs strong distal pulses no edema Neurologic:  Normal speech and language. Patient diffusely weak but not really focal. Very difficult to get a good neurologic exam secondary to compliance issues and generalized weakness. He has good rectal tone Skin:  Skin is warm, dry and intact. No rash noted. Psychiatric: Mood and affect are normal. Speech and behavior are normal.  ____________________________________________   LABS (all labs ordered are listed, but only abnormal results are displayed)  Labs Reviewed  CBC - Abnormal; Notable for the following:       Result Value   WBC 10.7 (*)    RBC 3.97 (*)    Hemoglobin 12.7 (*)    HCT 37.4 (*)    Platelets 149 (*)    All other components within normal limits  DIFFERENTIAL - Abnormal; Notable for the following:    Neutro Abs 7.9 (*)    All other components within normal limits  COMPREHENSIVE METABOLIC PANEL - Abnormal; Notable for the following:    Glucose, Bld 132 (*)    BUN 32 (*)    Creatinine, Ser 2.95 (*)    Total Protein 8.7 (*)    GFR calc non Af Amer 21 (*)    GFR calc Af Amer 24 (*)    All other components within normal limits  URINE CULTURE  CULTURE, BLOOD (ROUTINE X 2)  CULTURE, BLOOD (ROUTINE X 2)  PROTIME-INR  APTT  TROPONIN I  URINALYSIS, COMPLETE (UACMP) WITH MICROSCOPIC  CBG MONITORING, ED   ____________________________________________  EKG  I personally interpreted any EKGs ordered by me or triage Normal sinus rhythm rate 73 bpm, no acute ST elevation or depression normal axis unremarkable EKG ____________________________________________  RADIOLOGY  I reviewed any imaging ordered by me or triage that were performed during my shift and, if possible, patient and/or family made aware of any abnormal findings. ____________________________________________   PROCEDURES  Procedure(s) performed: None  Procedures  Critical Care performed:  None  ____________________________________________   INITIAL IMPRESSION / ASSESSMENT AND PLAN / ED COURSE  Pertinent labs & imaging results that were available during my care of the patient were reviewed by me and considered in my medical decision making (see chart for details).  She was generalized weakness, dysuria, cough, mild headache and multiple other complaints. He is somewhat weak to examine. Not able to get around. Low suspicion for stroke he doesn't seem quite focal to my exam although again very limited exam. All this started when he was begun on baclofen he states as well as Lyrica. Can also be an med reaction. Unclear exactly. Given his generalized weakness she likely will require admission. We will obtain blood cultures given HIV status urinalysis and urine culture, we will avoid IV fluids as he does have history of CHF, we will obtain chest x-ray and we will closely watch him in the department. He certainly does not appear to be acutely focally toxic, no evidence of acute CVA when CT which is reassuring. Don't suspect meningitis. Patient has no meningismus, no fever, low suspicion for spinal abscess or spinal lesion given good rectal tone and upper and lower extremity involvement and no neck pain, we will continue to observe  ----------------------------------------- 7:07 PM on 11/30/2016 -----------------------------------------  The patient is on aspirin and Plavix, he is therefore not a good candidate for an LP and don't think one is indicated anyway. Certainly the wrist up the benefits. We will see if we can get him admitted for further evaluation of generalized weakness     ____________________________________________   FINAL CLINICAL IMPRESSION(S) / ED DIAGNOSES  Final diagnoses:  None      This chart was dictated using voice recognition software.  Despite best efforts to proofread,  errors can occur which can change meaning.      Schuyler Amor, MD 11/30/16  Kennieth Francois, MD 11/30/16  [id number]    Schuyler Amor, MD 11/30/16 [phone]

## 2016-11-30 NOTE — H&P (Signed)
Jared Walker NAME: Jared Walker    MR#:  [id number]  DATE OF BIRTH:  01/16/1951  DATE OF ADMISSION:  11/30/2016  PRIMARY CARE PHYSICIAN: Tracie Harrier, MD   REQUESTING/REFERRING PHYSICIAN: Dr. Charlotte Crumb  CHIEF COMPLAINT:   Chief Complaint  Patient presents with  . Weakness  . Headache    HISTORY OF PRESENT ILLNESS:  Jared Walker  is a 66 y.o. male with a known history of HIV on treatment, hypertension, coronary artery disease, CK D, chronic anemia presents to hospital secondary to altered mental status. Patient is very drowsy at this time and most of the history is obtained from his wife at bedside. Patient has issues with chronic low back pain and has been following with pain management clinic. He takes Norco for his pain and was recently started on Lyrica and baclofen as well about a week ago. For the last 3 days he has been acting very confused, extremely weak and fatigued. He has been sleeping since yesterday, at baseline he ambulates and is active and walks with a cane. Patient has been acting very weak and dropping things with his right hand according to his wife. He also complains of more right-sided weakness than left side though on exam he is globally weak. His speech is clear when aroused but confused. Denies any facial droop or trouble swallowing. No nausea or vomiting. No fevers or chills. No other illnesses.   PAST MEDICAL HISTORY:   Past Medical History:  Diagnosis Date  . Anemia   . Aortic stenosis, mild    mild AS by echo, 08/2011  . Chronic kidney disease    kidney stones  . Coronary artery disease   . Depression   . Full dentures    upper and lower  . Gout   . Heart murmur   . Hep C w/o coma, chronic ([place]) 01/19/2015  . Hepatitis C 09/25/11  . HIV positive ([place]) 09/25/11  . Hypertension   . MI (myocardial infarction) ([place])     PAST SURGICAL HISTORY:   Past Surgical History:  Procedure  Laterality Date  . BACK SURGERY    . CARDIOVASCULAR STRESS TEST     at [facility]  . CHOLECYSTECTOMY    . CLOSED REDUCTION PELVIC FRACTURE    . ESOPHAGOGASTRODUODENOSCOPY (EGD) WITH PROPOFOL N/A 01/21/2015   Procedure: ESOPHAGOGASTRODUODENOSCOPY (EGD) WITH PROPOFOL;  Surgeon: Manya Silvas, MD;  Location: [facility] ENDOSCOPY;  Service: Endoscopy;  Laterality: N/A;  . ESOPHAGOGASTRODUODENOSCOPY (EGD) WITH PROPOFOL N/A 09/04/2015   Procedure: ESOPHAGOGASTRODUODENOSCOPY (EGD) WITH PROPOFOL;  Surgeon: Manya Silvas, MD;  Location: [facility] ENDOSCOPY;  Service: Endoscopy;  Laterality: N/A;  . FRACTURE SURGERY    . KNEE ARTHROSCOPY  2009   Right  . LUMBAR LAMINECTOMY/DECOMPRESSION MICRODISCECTOMY  09/25/2011   Procedure: LUMBAR LAMINECTOMY/DECOMPRESSION MICRODISCECTOMY;  Surgeon: Erline Levine, MD;  Location: [place] NEURO ORS;  Service: Neurosurgery;  Laterality: Left;  Left Lumbar Four-Five Microdiskectomy  . NEPHROSTOMY     tube Left  . SHOULDER ARTHROSCOPY WITH OPEN ROTATOR CUFF REPAIR Right 10/12/2014   Procedure: SHOULDER ARTHROSCOP with decompression;  Surgeon: Leanor Kail, MD;  Location: [place];  Service: Orthopedics;  Laterality: Right;    SOCIAL HISTORY:   Social History  Substance Use Topics  . Smoking status: Former Research scientist (life sciences)  . Smokeless tobacco: Former Systems developer    Quit date: 03/13/1972  . Alcohol use No    FAMILY HISTORY:   Family History  Problem Relation  Age of Onset  . Hypertension Mother   . Cancer Father   . Heart disease Sister   . Diabetes Brother   . Anesthesia problems Neg Hx     DRUG ALLERGIES:   Allergies  Allergen Reactions  . Buchu-Cornsilk-Ch Grass-Hydran Other (See Comments)    Gout.  . Colchicine Diarrhea    "Have diarrhea when taken for long periods of time"  . Nifedipine Other (See Comments)    "Unknown"  . Oxycodone-Acetaminophen Other (See Comments)    GI upset.  . Tramadol Other (See Comments)    "unknown"    REVIEW OF SYSTEMS:   Review  of Systems  Unable to perform ROS: Mental status change    MEDICATIONS AT HOME:   Prior to Admission medications   Medication Sig Start Date End Date Taking? Authorizing Provider  allopurinol (ZYLOPRIM) 100 MG tablet Take 200 mg by mouth daily. *Take along with 300 mg tablet to equal total dose of 500 mg daily.*   Yes [provider]  amLODipine (NORVASC) 10 MG tablet Take 10 mg by mouth. 11/13/15 11/30/16 Yes [provider]  aspirin EC 81 MG EC tablet Take 1 tablet (81 mg total) by mouth daily. 11/11/15  Yes Mody, Ulice Bold, MD  atorvastatin (LIPITOR) 10 MG tablet Take 80 mg by mouth. 11/13/15 11/30/16 Yes [provider]  carvedilol (COREG) 12.5 MG tablet Take 12.5 mg by mouth 2 (two) times daily with a meal.   Yes [provider]  Cholecalciferol (VITAMIN D-1000 MAX ST) 1000 units tablet Take 1,000 Units by mouth daily.   Yes [provider]  clopidogrel (PLAVIX) 75 MG tablet Take 75 mg by mouth daily.   Yes [provider]  DESCOVY 200-25 MG tablet Take 1 tablet by mouth at bedtime.  08/12/15  Yes [provider]  diazepam (VALIUM) 5 MG tablet Take 5 mg by mouth at bedtime. For sleep   Yes [provider]  docusate sodium (COLACE) 100 MG capsule Take 100 mg by mouth at bedtime.  05/24/14  Yes [provider]  dolutegravir (TIVICAY) 50 MG tablet Take 50 mg by mouth at bedtime.  07/31/15  Yes [provider]  folic acid (FOLVITE) 1 MG tablet Take 1 mg by mouth every evening.    Yes [provider]  isosorbide mononitrate (IMDUR) 30 MG 24 hr tablet Take 1 tablet (30 mg total) by mouth daily. 03/29/16  Yes Theodoro Grist, MD  losartan (COZAAR) 100 MG tablet Take 100 mg by mouth every morning.  10/31/15  Yes [provider]  omeprazole (PRILOSEC) 40 MG capsule Take 40 mg by mouth every morning.  11/06/15  Yes [provider]  oxyCODONE-acetaminophen (PERCOCET/ROXICET) 5-325 MG tablet Take 1  tablet by mouth 2 (two) times daily.   Yes [provider]  pregabalin (LYRICA) 25 MG capsule Take 25 mg by mouth daily. 11/11/16  Yes [provider]  sertraline (ZOLOFT) 50 MG tablet Take 50 mg by mouth every evening.    Yes [provider]  torsemide (DEMADEX) 20 MG tablet Take 1 tablet (20 mg total) by mouth daily. 03/28/16  Yes Theodoro Grist, MD  vitamin B-12 (CYANOCOBALAMIN) 1000 MCG tablet Take 1,000 mcg by mouth daily.   Yes [provider]  albuterol (PROVENTIL HFA;VENTOLIN HFA) 108 (90 Base) MCG/ACT inhaler Inhale 2 puffs into the lungs every 6 (six) hours as needed for wheezing or shortness of breath. 08/12/16   Lavonia Drafts, MD  chlorpheniramine-HYDROcodone [facility] ER) 10-8  MG/5ML SUER Take 5 mLs by mouth 2 (two) times daily. Patient not taking: Reported on 11/30/2016 08/08/16   Earleen Newport, MD  ferrous sulfate 325 (65 FE) MG tablet Take 325 mg by mouth 2 (two) times daily. 01/10/15 01/10/16  [provider]  HYDROcodone-acetaminophen (NORCO/VICODIN) 5-325 MG tablet Take 1 tablet by mouth See admin instructions. Take 1 tablet by mouth at bedtime if needed for pain, and Take 1 tablet by mouth 30 min before physical therapy. 10/18/15   [provider]  predniSONE (DELTASONE) 50 MG tablet Take 1 tablet (50 mg total) by mouth daily with breakfast. Patient not taking: Reported on 11/30/2016 08/12/16   Lavonia Drafts, MD      VITAL SIGNS:  Blood pressure 119/76, pulse 72, temperature 98.9 F (37.2 C), temperature source Oral, resp. rate 16, height 5\' 11"  (1.803 m), weight 99.3 kg (219 lb), SpO2 98 %.  PHYSICAL EXAMINATION:   Physical Exam  GENERAL:  66 y.o.-year-old patient lying in the bed with no acute distress.  EYES: Pupils equal, round, reactive to light and accommodation. No scleral icterus. Extraocular muscles intact.  HEENT: Head atraumatic, normocephalic. Oropharynx and nasopharynx clear.  NECK:  Supple, no  jugular venous distention. No thyroid enlargement, no tenderness.  LUNGS: Normal breath sounds bilaterally, no wheezing, rales,rhonchi or crepitation. No use of accessory muscles of respiration.  CARDIOVASCULAR: S1, S2 normal. No murmurs, rubs, or gallops.  ABDOMEN: Soft, nontender, nondistended. Bowel sounds present. No organomegaly or mass.  EXTREMITIES: No pedal edema, cyanosis, or clubbing.  NEUROLOGIC: Cranial nerves II through XII are intact. Muscle strength 4/5 in all extremities. Sensation intact. Global weakness noted.Gait not checked.  PSYCHIATRIC: The patient is drowsy, arousable and oriented x 1-2 SKIN: No obvious rash, lesion, or ulcer.   LABORATORY PANEL:   CBC  Recent Labs Lab 11/30/16 1627  WBC 10.7*  HGB 12.7*  HCT 37.4*  PLT 149*   ------------------------------------------------------------------------------------------------------------------  Chemistries   Recent Labs Lab 11/30/16 1627  NA 139  K 4.4  CL 106  CO2 23  GLUCOSE 132*  BUN 32*  CREATININE 2.95*  CALCIUM 10.1  AST 34  ALT 34  ALKPHOS 57  BILITOT 1.1   ------------------------------------------------------------------------------------------------------------------  Cardiac Enzymes  Recent Labs Lab 11/30/16 1627  TROPONINI <0.03   ------------------------------------------------------------------------------------------------------------------  RADIOLOGY:  Dg Chest 2 View  Result Date: 11/30/2016 CLINICAL DATA:  Urinary incontinence since Friday EXAM: CHEST  2 VIEW COMPARISON:  August 12, 2016 FINDINGS: The heart size and mediastinal contours are stable. The aorta is tortuous. Both lungs are clear. The visualized skeletal structures are stable. IMPRESSION: No active cardiopulmonary disease. Electronically Signed   By: Abelardo Diesel M.D.   On: 11/30/2016 19:05   Ct Head Wo Contrast  Result Date: 11/30/2016 CLINICAL DATA:  Urinary incontinence, weakness and loss of mobility since  Friday at around 1400 hours. EXAM: CT HEAD WITHOUT CONTRAST TECHNIQUE: Contiguous axial images were obtained from the base of the skull through the vertex without intravenous contrast. COMPARISON:  CT from 11/12/2011 FINDINGS: Brain: Chronic mild-to-moderate central and mild superficial atrophy with chronic small vessel ischemic change of periventricular and subcortical white matter as before. No large vascular territory infarction. No hemorrhage, midline shift or edema. No intra-axial mass nor extra-axial fluid collections. Vascular: Atherosclerosis of the cavernous carotid arteries bilaterally. No hyperdense vessels nor unexpected calcifications. Skull: Negative for acute fracture nor bone destruction. Sinuses/Orbits: No acute finding. Other: None. IMPRESSION: Chronic stable atrophy with small vessel ischemia with periventricular and subcortical  white matter. No acute appearing intracranial abnormality. Electronically Signed   By: Ashley Royalty M.D.   On: 11/30/2016 17:05    EKG:   Orders placed or performed during the hospital encounter of 11/30/16  . EKG 12-Lead  . EKG 12-Lead  . ED EKG  . ED EKG    IMPRESSION AND PLAN:   Jerrin Recore  is a 66 y.o. male with a known history of HIV on treatment, hypertension, coronary artery disease, CK D, chronic anemia presents to hospital secondary to altered mental status.  #1 acute encephalopathy- with significant fatigue and confusion. -Could be toxic metabolic encephalopathy from his recent medications. Hold his Vicodin, hold also Lyrica and baclofen at this time. -CT of the head without any acute findings. Due to worse right-sided weakness compared to left according to wife and dropping things with his right hand, we'll get an MRI of the brain to rule out any acute infarct. -No fevers or neck stiffness. No acute indication for lumbar puncture at this time. -Physical therapy consult. Neuro checks -Check ammonia level -Urine analysis is negative for  any infection.  #2 CK D stage IV-baseline creatinine around 2.8. Stable and monitor closely. Avoid nephrotoxins.  #3 hypertension-continue home medications. Patient on losartan, Imdur, Coreg.  #4 HIV-follows up with ID at [facility]. According to him viral load is undetectable and CD4 is within normal limits. Continue medications.  #5 DVT prophylaxis-patient on Lovenox    All the records are reviewed and case discussed with ED provider. Management plans discussed with the patient, family and they are in agreement.  CODE STATUS: Full Code  TOTAL TIME TAKING CARE OF THIS PATIENT: 50 minutes.    Francessca Friis M.D on 11/30/2016 at 8:01 PM  Between 7am to 6pm - Pager - [phone]  After 6pm go to [email or web address] - password [facility]  Office  [phone]  CC: Primary care physician; Tracie Harrier, MD

## 2016-11-30 NOTE — ED Notes (Signed)
Patient states he has back problems and it affects his legs. Patient has been having headache "for about a week." patient states since  11/27/16 he hasnt been able to control his urine or bowels. Patient states that is also when his bilateral hands started shaking right worse than left and his walking was also affected.

## 2016-11-30 NOTE — ED Triage Notes (Signed)
States patient has lost mobility, having urinary incontinence, and weakness since Friday at around 1400.  Wife states patient slept the whole weekend.

## 2016-12-01 ENCOUNTER — Inpatient Hospital Stay: Payer: Medicare Other

## 2016-12-01 LAB — CBC
HCT: 35.4 % — ABNORMAL LOW (ref 40.0–52.0)
Hemoglobin: 11.8 g/dL — ABNORMAL LOW (ref 13.0–18.0)
MCH: 31.7 pg (ref 26.0–34.0)
MCHC: 33.5 g/dL (ref 32.0–36.0)
MCV: 94.6 fL (ref 80.0–100.0)
PLATELETS: 151 10*3/uL (ref 150–440)
RBC: 3.74 MIL/uL — AB (ref 4.40–5.90)
RDW: 14.2 % (ref 11.5–14.5)
WBC: 7.9 10*3/uL (ref 3.8–10.6)

## 2016-12-01 LAB — TSH: TSH: 1.106 u[IU]/mL (ref 0.350–4.500)

## 2016-12-01 LAB — BASIC METABOLIC PANEL
Anion gap: 5 (ref 5–15)
BUN: 32 mg/dL — ABNORMAL HIGH (ref 6–20)
CALCIUM: 9.3 mg/dL (ref 8.9–10.3)
CO2: 26 mmol/L (ref 22–32)
CREATININE: 2.83 mg/dL — AB (ref 0.61–1.24)
Chloride: 108 mmol/L (ref 101–111)
GFR calc non Af Amer: 22 mL/min — ABNORMAL LOW (ref >60)
GFR, EST AFRICAN AMERICAN: 26 mL/min — AB (ref >60)
Glucose, Bld: 126 mg/dL — ABNORMAL HIGH (ref 65–99)
Potassium: 4 mmol/L (ref 3.5–5.1)
SODIUM: 139 mmol/L (ref 135–145)

## 2016-12-01 LAB — TROPONIN I

## 2016-12-01 MED ORDER — ORAL CARE MOUTH RINSE
15.0000 mL | Freq: Two times a day (BID) | OROMUCOSAL | Status: DC
  Administered 2016-12-01: 09:00:00 15 mL via OROMUCOSAL

## 2016-12-01 NOTE — Evaluation (Signed)
Physical Therapy Evaluation Patient Details Name: Jared Walker MRN: [id number] DOB: 07-22-1950 Today's Date: 12/01/2016   History of Present Illness  presented to ER secondary to AMS, lethargy; admitted with acute encephalopathy, likely toxic metabolic encephalopathy due to recent medication changes (recently started lyrica and baclofen for chronic back pain)  Clinical Impression  Upon evaluation, patient alert and oriented to basic information; follows simple commands and demonstrates good effort with all mobility tasks.  Able to complete bed mobility with mod indep; sit/stand, basic transfers and gait (120') with RW, cga/close sup.  Slow, but steady performance, without overt buckling or LOB.  Poor ability to maintain balance in narrowed stance or periods of SLS (1-2 seconds bilat), requiring unilateral UE support and cga/close sup from therapist for control.  Do recommend continued use of RW for all mobility at this time. Would benefit from skilled PT to address above deficits and promote optimal return to PLOF; recommend transition to home with outpatient PT upon discharge to address continued balance and mobility deficits.    Follow Up Recommendations Outpatient PT    Equipment Recommendations  Rolling walker with 5" wheels    Recommendations for Other Services       Precautions / Restrictions Precautions Precautions: Fall Restrictions Weight Bearing Restrictions: No      Mobility  Bed Mobility Overal bed mobility: Modified Independent                Transfers Overall transfer level: Needs assistance Equipment used: Rolling walker (2 wheeled) Transfers: Sit to/from Stand Sit to Stand: Min guard            Ambulation/Gait Ambulation/Gait assistance: Min guard;Supervision Ambulation Distance (Feet): 120 Feet Assistive device: Rolling walker (2 wheeled)       General Gait Details: slow, but steady, cadence and overall gait speed.  Veers mildly with head  turns, but self-corrects.  Does feel RW is necessary for safety/support at this time.  Stairs         General stair comments: declined stairs at this time due to fatigue; headache.  Reviewed safe technique and encouraged step to pattern.  Able to maintain single UE support (to simulate railing) and alternate LE SLS (to simulate balance/strength necessary for stairs.  Able to perform with no greater than cga from therapist.  Patent feels comfortable with stairs; continues to decline despite encouagement  Wheelchair Mobility    Modified Rankin (Stroke Patients Only)       Balance Overall balance assessment: Needs assistance Sitting-balance support: No upper extremity supported;Feet supported Sitting balance-Leahy Scale: Good     Standing balance support: Bilateral upper extremity supported Standing balance-Leahy Scale: Fair   Single Leg Stance - Right Leg: 1 Single Leg Stance - Left Leg: 2                         Pertinent Vitals/Pain Faces Pain Scale: Hurts little more Pain Location: headache Pain Descriptors / Indicators: Aching Pain Intervention(s): Limited activity within patient's tolerance;Premedicated before session;Repositioned;Patient requesting pain meds-RN notified    Home Living Family/patient expects to be discharged to:: Private residence Living Arrangements: Spouse/significant other Available Help at Discharge: Family;Available 24 hours/day Type of Home: House Home Access: Stairs to enter Entrance Stairs-Rails: Right Entrance Stairs-Number of Steps: 5 Home Layout: Two level;Bed/bath upstairs Home Equipment: Cane - single point Additional Comments: Has bedroom and 1/2 back on main level of home if needed.    Prior Function Level of Independence: Independent with  assistive device(s)         Comments: Mod indep with SPC for ADLs, household and community mobility.  Denies recent fall history.  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     Hand Dominance        Extremity/Trunk Assessment   Upper Extremity Assessment Upper Extremity Assessment: Overall WFL for tasks assessed    Lower Extremity Assessment Lower Extremity Assessment: Overall WFL for tasks assessed (L LE 4+ to 5/5, R LE 4-/5; reports generalized paresthesia throughout R LE)       Communication   Communication: No difficulties  Cognition Arousal/Alertness: Awake/alert Behavior During Therapy: WFL for tasks assessed/performed Overall Cognitive Status: Within Functional Limits for tasks assessed                                 General Comments: immediate recall 3/3, delayed recall 1/3      General Comments      Exercises Other Exercises Other Exercises: Verbally reviewed technique for stairs, car transfers; recommend continued use of RW for all mobility at this time. Patient/wife voiced understanding of all information.   Assessment/Plan    PT Assessment Patient needs continued PT services  PT Problem List Decreased strength;Decreased activity tolerance;Decreased balance;Decreased mobility;Decreased coordination;Decreased knowledge of use of DME;Decreased safety awareness;Decreased knowledge of precautions       PT Treatment Interventions DME instruction;Gait training;Stair training;Functional mobility training;Therapeutic activities;Therapeutic exercise;Balance training;Cognitive remediation;Patient/family education    PT Goals (Current goals can be found in the Care Plan section)  Acute Rehab PT Goals Patient Stated Goal: to go home PT Goal Formulation: With patient/family Time For Goal Achievement: 12/15/16 Potential to Achieve Goals: Good    Frequency Min 2X/week   Barriers to discharge        Co-evaluation               AM-PAC PT "6 Clicks" Daily Activity  Outcome Measure Difficulty turning over in bed (including adjusting bedclothes, sheets and blankets)?: None Difficulty moving from lying  on back to sitting on the side of the bed? : None Difficulty sitting down on and standing up from a chair with arms (e.g., wheelchair, bedside commode, etc,.)?: A Little Help needed moving to and from a bed to chair (including a wheelchair)?: A Little Help needed walking in hospital room?: A Little Help needed climbing 3-5 steps with a railing? : A Little 6 Click Score: 20    End of Session Equipment Utilized During Treatment: Gait belt Activity Tolerance: Patient tolerated treatment well Patient left: in bed;with bed alarm set;with call bell/phone within reach;with family/visitor present Nurse Communication: Mobility status PT Visit Diagnosis: Muscle weakness (generalized) (M62.81);Difficulty in walking, not elsewhere classified (R26.2)    Time: 1423-9532 PT Time Calculation (min) (ACUTE ONLY): 25 min   Charges:   PT Evaluation $PT Eval Low Complexity: 1 Procedure PT Treatments $Therapeutic Activity: 8-22 mins   PT G Codes:        Necia Kamm H. Owens Shark, PT, DPT, NCS 12/01/16, 7:50 PM [phone]

## 2016-12-01 NOTE — Care Management (Signed)
Physical therapy evaluation completed, Recommending outpatient therapy. Would like to go to the Rehabilitation Department at [facility]. Referral signed per Dr. Harrel Lemon and faxed to Rehabilitation Department. Rolling Walker recommended. Floydene Flock, Advanced representative updated.  Discharged to home per Dr. Eliberto Ivory RN MSN CCM Care Management [phone]

## 2016-12-01 NOTE — Plan of Care (Signed)
Problem: Education: Goal: Knowledge of [facility] Education information/materials will improve Outcome: Progressing Oriented to unit.  VSS, free of falls.  Reported HA pain 8-9/10, PRN PO Tylenol ineffective, Dr. Jannifer Franklin paged, pain improved to 0-3/10 w/ ordered PRN PO Norco 5-325mg  x2 during shift.  Requested Valium, reports PTA use, Dr. Jannifer Franklin paged, received ordered PO Valium 5mg .  No other needs overnight.  NIH 8 for BLE weakness, R sided atatxia, mild R sided sensation loss, mild facial droop.  Neuro checks stable.  Bed in low position, call bell within reach.  WCTM.

## 2016-12-01 NOTE — Discharge Summary (Signed)
Physician Discharge Summary  Patient ID: Jared Walker MRN: [id number] DOB/AGE: August 30, 1950 66 y.o.  Admit date: 11/30/2016 Discharge date: 12/01/2016  Admission Diagnoses:1. Acute encephalopathy. 2. Adverse drug reaction 3. Chronic back pain 4. Stage IV chronic kidney disease  Discharge Diagnoses: 1. Acute encephalopathy  3. Adverse drug reaction to it. I'll be secondary to acute stroke reaction from combination of Lyrica and baclofen.  Active Problems:   Acute encephalopathy   Discharged Condition: stable  Hospital Course:  1. Acute encephalopathy. This was thought to be secondary to adverse drug reaction. Medications have been stopped and patient has returned to baseline mental function. 2. Adverse drug reaction. So secondary to the Lyrica and baclofen. These medications have been held. Symptoms have resolved. Patient is advised not to restart these medications and following up with either her primary care doctor or the [facility] pain clinic where they were prescribed. Have arranged follow-up with Dr. Lucien Mons next week     Significant Diagnostic Studies:   Treatments:   Discharge Exam: Blood pressure 100/61, pulse 69, temperature 98.5 F (36.9 C), temperature source Oral, resp. rate 20, height 5\' 11"  (1.803 m), weight 100.3 kg (221 lb 1.6 oz), SpO2 97 %. General appearance: no distress Neurologic: Alert and oriented X 3, normal strength and tone. Normal symmetric reflexes. Normal coordination and gait  Disposition: 01-Home or Self Care  Discharge Instructions    Diet - low sodium heart healthy    Complete by:  As directed    Increase activity slowly    Complete by:  As directed      Allergies as of 12/01/2016      Reactions   Buchu-cornsilk-ch Grass-hydran Other (See Comments)   Gout.   Colchicine Diarrhea   "Have diarrhea when taken for long periods of time"   Nifedipine Other (See Comments)   "Unknown"   Oxycodone-acetaminophen Other (See Comments)   GI upset.    Tramadol Other (See Comments)   "unknown"      Medication List    STOP taking these medications   chlorpheniramine-HYDROcodone 10-8 MG/5ML Suer Commonly known as:  TUSSIONEX PENNKINETIC ER   HYDROcodone-acetaminophen 5-325 MG tablet Commonly known as:  NORCO/VICODIN   pregabalin 25 MG capsule Commonly known as:  LYRICA     TAKE these medications   albuterol 108 (90 Base) MCG/ACT inhaler Commonly known as:  PROVENTIL HFA;VENTOLIN HFA Inhale 2 puffs into the lungs every 6 (six) hours as needed for wheezing or shortness of breath.   allopurinol 100 MG tablet Commonly known as:  ZYLOPRIM Take 200 mg by mouth daily. *Take along with 300 mg tablet to equal total dose of 500 mg daily.*   amLODipine 10 MG tablet Commonly known as:  NORVASC Take 10 mg by mouth.   aspirin 81 MG EC tablet Take 1 tablet (81 mg total) by mouth daily.   carvedilol 12.5 MG tablet Commonly known as:  COREG Take 12.5 mg by mouth 2 (two) times daily with a meal.   clopidogrel 75 MG tablet Commonly known as:  PLAVIX Take 75 mg by mouth daily.   DESCOVY 200-25 MG tablet Generic drug:  emtricitabine-tenofovir AF Take 1 tablet by mouth at bedtime.   diazepam 5 MG tablet Commonly known as:  VALIUM Take 5 mg by mouth at bedtime. For sleep   docusate sodium 100 MG capsule Commonly known as:  COLACE Take 100 mg by mouth at bedtime.   dolutegravir 50 MG tablet Commonly known as:  TIVICAY Take 50 mg by  mouth at bedtime.   ferrous sulfate 325 (65 FE) MG tablet Take 325 mg by mouth 2 (two) times daily.   folic acid 1 MG tablet Commonly known as:  FOLVITE Take 1 mg by mouth every evening.   isosorbide mononitrate 30 MG 24 hr tablet Commonly known as:  IMDUR Take 1 tablet (30 mg total) by mouth daily.   LIPITOR 10 MG tablet Generic drug:  atorvastatin Take 80 mg by mouth.   losartan 100 MG tablet Commonly known as:  COZAAR Take 100 mg by mouth every morning.   omeprazole 40 MG  capsule Commonly known as:  PRILOSEC Take 40 mg by mouth every morning.   oxyCODONE-acetaminophen 5-325 MG tablet Commonly known as:  PERCOCET/ROXICET Take 1 tablet by mouth 2 (two) times daily.   predniSONE 50 MG tablet Commonly known as:  DELTASONE Take 1 tablet (50 mg total) by mouth daily with breakfast.   sertraline 50 MG tablet Commonly known as:  ZOLOFT Take 50 mg by mouth every evening.   torsemide 20 MG tablet Commonly known as:  DEMADEX Take 1 tablet (20 mg total) by mouth daily.   vitamin B-12 1000 MCG tablet Commonly known as:  CYANOCOBALAMIN Take 1,000 mcg by mouth daily.   VITAMIN D-1000 MAX ST 1000 units tablet Generic drug:  Cholecalciferol Take 1,000 Units by mouth daily.      Follow-up Information    Tracie Harrier, MD Follow up.   Specialty:  Internal Medicine Why:  Chronic cough patient should follow up for next week. Contact information: [address] [place] [address] [phone]           Signed: Baxter Hire 12/01/2016, 1:52 PM

## 2016-12-01 NOTE — Progress Notes (Signed)
Discharge instructions reviewed with pt and wife, states understanding, pt discharged via wheelchair

## 2016-12-01 NOTE — Progress Notes (Signed)
Pt d/ced home.  MRI neg for acute findings.  Dr. Rozell Searing this is r/t addition of lyrica to medications.  Patient has some bilateral weakness - upper and lower extremities.  PT worked with him and cleared him for d/c with outpt PT.  Pt will go home with his wife.

## 2016-12-02 LAB — URINE CULTURE

## 2016-12-05 LAB — CULTURE, BLOOD (ROUTINE X 2)
CULTURE: NO GROWTH
Culture: NO GROWTH
Special Requests: ADEQUATE
Special Requests: ADEQUATE

## 2016-12-14 ENCOUNTER — Ambulatory Visit: Payer: Medicare Other | Admitting: Physical Therapy

## 2016-12-15 MED FILL — DESCOVY/200MG/25MG/TABS: DESCOVY/200MG/25MG/TABS | 30 days supply | Qty: 30 | Fill #9

## 2016-12-15 MED FILL — TIVICAY/50MG/TAB: TIVICAY/50MG/TAB | 30 days supply | Qty: 30 | Fill #9

## 2016-12-15 NOTE — Unmapped (Signed)
Specialty Pharmacy Refill Coordination Note     Samuel [name] is a 66 y.o. male contacted today regarding refills of his specialty medication(s).    Reviewed and verified with patient:      Specialty medication(s) and dose(s) confirmed: yes  Changes to medications: no  Changes to insurance: no    Medication Adherence    Patient reported X missed doses in the last month:  0  Specialty Medication:  TIVICAY   Patient is on additional specialty medications:  Yes  Additional Specialty Medications:  DESCOVY  Patient Reported Additional Medication X Missed Doses in the Last Month:  0  Patient is on more than two specialty medications:  No  Informant:  patient  Confirmed plan for next specialty medication refill:  delivery by pharmacy  Refills needed for supportive medications:  not needed  Medication Assistance Program  Was [name] Assistance Provided:  Yes  Refill Coordination  Has the Patient's Contact Information Changed:  No  Is the Shipping Address Different:  No  Shipping Information  Delivery Scheduled:  Yes  Delivery Date:  12/17/16  Medications to be Shipped:  [facility] ADDRESS: [address] [place], [place] [address]       Follow-up: 3 week(s)     [name]  Specialty Pharmacy Technician

## 2016-12-16 ENCOUNTER — Ambulatory Visit: Payer: Medicare Other | Admitting: Physical Therapy

## 2016-12-22 ENCOUNTER — Ambulatory Visit: Payer: Medicare Other | Admitting: Physical Therapy

## 2016-12-24 ENCOUNTER — Encounter: Payer: Medicare Other | Admitting: Physical Therapy

## 2016-12-29 ENCOUNTER — Encounter: Payer: Medicare Other | Admitting: Physical Therapy

## 2016-12-31 ENCOUNTER — Encounter: Payer: Medicare Other | Admitting: Physical Therapy

## 2017-01-05 ENCOUNTER — Encounter: Payer: Medicare Other | Admitting: Physical Therapy

## 2017-01-06 MED FILL — BACLOFEN/10MG/TABS: BACLOFEN/10MG/TABS | 30 days supply | Qty: 30 | Fill #1

## 2017-01-06 NOTE — Unmapped (Signed)
[facility] Specialty Pharmacy Refill Coordination Note  Specialty Medication(s): DESCOVY, Chaney Born  Additional Medications shipped: NO    [name], DOB: 09-11-1950  Phone: [phone] (home) , Alternate phone contact: N/A  Phone or address changes today?: No  All above HIPAA information was verified with patient.  Shipping Address: [address]  [place] [address]   Insurance changes? No    Completed refill call assessment today to schedule patient's medication shipment from the [facility] [phone]).      Confirmed the medication and dosage are correct and have not changed: Yes, regimen is correct and unchanged.    Confirmed patient started or stopped the following medications in the past month:  No, there are no changes reported at this time.    Are you tolerating your medication?:  Jamoni reports tolerating the medication.    ADHERENCE    Did you miss any doses in the past 4 weeks? No missed doses reported.    FINANCIAL/SHIPPING    Delivery Scheduled: Yes, Expected medication delivery date: 01/11/17     Jennye Moccasin did not have any additional questions at this time.    Delivery address validated in [facility] scheduling system: Yes, address listed in [facility]SI is correct.    We will follow up with patient monthly for standard refill processing and delivery.      Thank you,  Marletta Lor   [facility] Specialty Pharmacist

## 2017-01-06 NOTE — Unmapped (Signed)
I called and spoke with patient again. Patient received Tivicay and Descovy from July. Patient was concerned about being out of atorvastatin calcium 20mg  tab. Scheduled Atorvastatin to ship 8/9 for 8/10 delivery. Changed delivery of Tivicay and Descovy to go out 8/13 to 8/14, as to not compromise integrity of medication as being with [employer] all weekend in unknown temperatures.

## 2017-01-07 ENCOUNTER — Encounter: Payer: Medicare Other | Admitting: Physical Therapy

## 2017-01-07 MED FILL — ATORVASTATIN/20MG/TABS: ATORVASTATIN/20MG/TABS | 30 days supply | Qty: 30 | Fill #6

## 2017-01-10 NOTE — Unmapped (Deleted)
Assessment/Plan:            .   HIV:  Viral load well controlled on current ART (Descovy/dolutegravir), which he tolerates well. We discussed labs.  Will continue current regimen.    Cirrhosis: Followed by Liver.     Chronic HCV Infection (genotype 1a): Cured; will require q 6 month HCC surveillance, given cirrhosis.    CKD: Renal function has further deteriorated during the past two months. Followed by Nephrology.    S/p NSTEMI: Followed by Cardiology    HTN: Stable.    Sexual Health:     Mental Health: Stable.    Health maintenance:   Pap N/A  RPR False + (1:2 with neg FTA 11/12/2015)  GC/CT - Requested  Hb A1C 11/12/2015: 5.7   Cholesterol 11/12/2015: total cholesterol 148, HDL 37, LDL (calc) 80, triglycerides 155  TB screening neg, 06/10/2016  Lung cancer screening N/A (quit 1993 after 1 ppd x 10 yrs)  Colonoscopy - **Requested for screening**        Prevention, adherence and health education:   .  .  .  .  .  .  .    Educational and counseling services took 25  minutes of today's visit.    Follow-up:  Return to clinic in 1 month or sooner if needed.   Subjective:    Samuel [name] is a 66 y.o. male with HIV disease, CKD stage 3, cirrhosis, s/p cure of HCV 1a (with Zepatier x 12 wks, completed 07/13/2016; HCV not detected 10/28/2016) who presents to the Infectious Disease clinic for return HIV visit.     Chief Complaint: No chief complaint on file.      HPI:   HIV diagnosed in 12/1999. In 0120/02, CD4 count was 547 (25%) with a viral load of about 5293. For a number of years, his viral load and CD4 count were generally reasonably well maintained in the absence of antiretroviral therapy, and he never had any HIV-related symptoms. However, during 2008-2009, his viral load dramatically increased and his CD4 count fell. Viral load reached a height of 309,000 on 08/31/2007 and CD4 count fell substantially to 244 on 09/29/2007. He had always been reluctant to take antiviral medications and in fact consistently and repeatedly refused hepatitis C treatment as well as HIV treatment in the past. However, because of the deterioration of his CD4 count and viral load, I prescribed Atripla on 10/12/07. Although he agreed to take it he did not done so until more than a year later, despite assurances during each visit that he would begin ART immediately. Once he finally started ART, however, he tolerated it quite well. On 07/24/2015 he was switched from Atripla to Descovy/dolutegravir b/o CKD.     Followed by Liver, Nephrology, and Cardiology.    Renal function has deteriorated during the past two months with increasing pedal edema and increasing creat (2.17-3.34) noted during 11/03/2016 Nephrology visit.       Past Medical History:   Diagnosis Date   ??? CKD (chronic kidney disease) stage 3, GFR 30-59 ml/min 01/08/2015   ??? Coronary artery disease 10/2015    Multivessel disease, plan medical management after cath 6/17   ??? GERD (gastroesophageal reflux disease)    ??? Gout    ??? HIV (human immunodeficiency virus infection) (CMS-HCC)     Undectable viral load 5/17   ??? Hypertension    ??? Nephrolithiasis    ??? Nephrotic syndrome 03/05/2014   ??? Renal mass     Followed  by neprhology, MRI 8/16 improved, felt to be benign       Medications:  Current Outpatient Prescriptions   Medication Sig Dispense Refill   ??? albuterol (PROVENTIL HFA;VENTOLIN HFA) 90 mcg/actuation inhaler Inhale 2 puffs.     ??? allopurinol (ZYLOPRIM) 300 MG tablet Take 300 mg by mouth daily.   0   ??? amLODIPine (NORVASC) 2.5 MG tablet Take 1 tablet (2.5 mg total) by mouth every morning before breakfast. 90 tablet 3   ??? aspirin (ECOTRIN) 81 MG tablet   0   ??? atorvastatin (LIPITOR) 20 MG tablet TAKE 1 TABLET (20 MG) BY MOUTH ONCE DAILY 30 tablet PRN   ??? calcium carbonate-vitamin D2 500 mg(1,250mg ) -200 unit tablet Take 1 tablet by mouth Two (2) times a day.     ??? carvedilol (COREG) 12.5 MG tablet take 1 tablet by mouth twice a day 60 tablet 3   ??? cyanocobalamin 1000 MCG tablet Take 1,000 mcg by mouth.     ??? diazepam (VALIUM) 5 MG tablet Take 5 mg by mouth nightly.      ??? docusate sodium (COLACE) 100 MG capsule Take 1 capsule (100 mg total) by mouth every twelve (12) hours. (Patient taking differently: Take 100 mg by mouth two (2) times a day as needed. ) 60 capsule 0   ??? dolutegravir (TIVICAY) 50 mg Tab TABLET Take 1 tablet (50 mg total) by mouth daily. 31 tablet 11   ??? emtricitabine-tenofovir alafen (DESCOVY) 200-25 mg tablet Take 1 tablet by mouth daily. 31 tablet 11   ??? ferrous sulfate 325 (65 FE) MG tablet Take 325 mg by mouth daily with breakfast.      ??? folic acid (FOLVITE) 1 MG tablet   0   ??? glimepiride (AMARYL) 1 MG tablet take 1 tablet by mouth once daily with BREAKFAST  0   ??? HYDROcodone-acetaminophen (NORCO) 5-325 mg per tablet Directions are take one tablet 30 minutes before physical therapy, then take one tablet at bedtime as needed for pain     ??? nitroglycerin (NITROSTAT) 0.4 MG SL tablet place 1 tablet under the tongue if needed every 5 minutes for chest pain 25 tablet 1   ??? omega-3 fatty acids-fish oil 340-1,000 mg capsule Take 1 capsule by mouth.     ??? ondansetron (ZOFRAN) 4 MG tablet take 1 tablet by mouth every 8 hours if needed for nausea     ??? pantoprazole (PROTONIX) 40 MG tablet Take 40 mg by mouth daily.     ??? pregabalin (LYRICA) 25 MG capsule Take 1 capsule (25 mg total) by mouth once daily. 30 capsule 2   ??? sertraline (ZOLOFT) 50 MG tablet take 1 tablet by mouth once daily     ??? spironolactone (ALDACTONE) 25 MG tablet Take 2 tablets (50 mg total) by mouth daily. 90 tablet 3   ??? torsemide (DEMADEX) 20 MG tablet Take 2 tablets (40 mg total) by mouth daily. 90 tablet 3     No current facility-administered medications for this visit.        Allergies: Colchicine analogues and Tramadol    Social History:  As per MEDICAL RECORD NUMBERNo cigs or alcohol at all. No current IDU (quit many years ago). Lives with wife.     Review of Systems:  A 12 point review of systems was negative except for pertinent items noted in the HPI.    Objective:       There were no vitals taken for this visit.  GEN:  looks well, no apparent distress  EYES: sclerae anicteric and non injected  AVW:UJWJXBJY on top and bottom. No lesions.  LYMPH:no cervical or supraclavicular LAD  CV:no peripheral edema, normal pulses. Nl s1, s2, without s3, s4 +2/6 sys m diffusely over precordium (except no radiation to neck or axilla)  PULM:CTAB ant/post, normal work of breathing  NWG:NFAOZHYQM, nontender, no masses felt.  VH:QIONGEXB  RECTAL:deferred  SKIN:no petechiae, ecchymoses or obvious rashes on clothed exam  MSK:no joint tenderness; however, 2+ pitting edema bilat  NEURO:CN II-XII intact  PSYCH:attentive, appropriate affect, good eye contact, fluent speech    Recent Labs:  Absolute CD4 Count   Date Value Ref Range Status   09/16/2016 547 510 - 2,320 /uL Final   03/11/2016 551 510 - 2,320 /uL Final   10/16/2015 487 (L) 510 - 2,320 /uL Final   07/24/2015 477 (L) 510 - 2,320 /uL Final   08/01/2014 514 510 - 2,320 /uL Final   01/03/2014 430 (L) 510 - 2,320 /uL Final   11/23/2012 549 510 - 2,320 /uL Final   07/20/2012 470 (L) 510 - 2,320 CELLS/UL Final     HIV RNA   Date Value Ref Range Status   09/16/2016 <40 (H) <0 copies/mL Final   03/11/2016 <40 (H) <0 copies/mL Final      Lab Results   Component Value Date    WBC 9.7 10/28/2016    WBC 8.4 08/01/2014    HGB 12.7 (L) 10/28/2016    HGB 11.1 (L) 08/01/2014    PLT 197 10/28/2016    PLT 181 08/01/2014    CREATININE 3.75 (H) 11/10/2016    CREATININE 1.68 (H) 08/15/2014    AST 49 10/28/2016    AST 50 08/01/2014    ALT 64 10/28/2016    ALT 60 08/01/2014    TRIG 198 (H) 09/16/2016    TRIG 80 01/03/2014    HDL 32 (L) 09/16/2016    HDL 53 01/03/2014    LDL 39 (L) 09/16/2016    LDL 71 01/03/2014       Immunization History   Administered Date(s) Administered   ??? Hepatitis B, Adult 04/06/2016, 05/21/2016, 08/24/2016   ??? INFLUENZA TIV (TRI) PF (IM) 02/08/2008, 03/26/2010, 03/25/2011   ??? Influenza Vaccine Quad (IIV4 PF) 45mo+ injectable 07/24/2015, 02/05/2016   ??? Influenza Virus Vaccine, unspecified formulation 06/15/2014, 07/24/2015, 02/05/2016, 03/15/2016   ??? PPD Test 12/29/2006, 07/11/2008, 03/26/2010, 03/25/2011   ??? Pneumococcal Conjugate 13-Valent 04/13/2012   ??? Pneumococcal Polysaccharide 23 07/13/2000, 08/19/2005   ??? TdaP 08/31/2007   ??? Zoster Vaccine (HZV),recombinant,sub-unit,adjuvanted,im 09/16/2016

## 2017-01-11 MED FILL — TIVICAY/50MG/TAB: TIVICAY/50MG/TAB | 30 days supply | Qty: 30 | Fill #10

## 2017-01-11 MED FILL — DESCOVY/200MG/25MG/TABS: DESCOVY/200MG/25MG/TABS | 30 days supply | Qty: 30 | Fill #10

## 2017-01-12 ENCOUNTER — Encounter: Payer: Medicare Other | Admitting: Physical Therapy

## 2017-01-14 ENCOUNTER — Encounter: Payer: Medicare Other | Admitting: Physical Therapy

## 2017-01-19 ENCOUNTER — Encounter: Payer: Medicare Other | Admitting: Physical Therapy

## 2017-01-21 ENCOUNTER — Encounter: Payer: Medicare Other | Admitting: Physical Therapy

## 2017-01-26 ENCOUNTER — Encounter: Payer: Medicare Other | Admitting: Physical Therapy

## 2017-01-26 NOTE — Unmapped (Signed)
error 

## 2017-01-28 ENCOUNTER — Encounter: Payer: Medicare Other | Admitting: Physical Therapy

## 2017-02-04 NOTE — Unmapped (Signed)
Specialty Pharmacy Refill Coordination Note     Samuel [name] is a 66 y.o. male contacted today regarding refills of his specialty medication(s).    Reviewed and verified with patient:      Specialty medication(s) and dose(s) confirmed: yes  Changes to medications: no  Changes to insurance: no    Medication Adherence    Patient reported X missed doses in the last month:  0  Specialty Medication:  TIVICAY  Patient is on additional specialty medications:  Yes  Additional Specialty Medications:  DESCOVY  Patient Reported Additional Medication X Missed Doses in the Last Month:  0  Patient is on more than two specialty medications:  No  Informant:  patient  Confirmed plan for next specialty medication refill:  delivery by pharmacy  Medication Assistance Program  Refill Coordination  Has the Patient's Contact Information Changed:  No  Is the Shipping Address Different:  No  Shipping Information  Delivery Scheduled:  Yes  Delivery Date:  02/09/17  Medications to be Shipped:  TIVICAY  DESCOVY  BACLOFEN  ATORVASTATIN            Follow-up: 3 week(s)     [name]  Specialty Pharmacy Technician

## 2017-02-06 MED FILL — TIVICAY/50MG/TAB: TIVICAY/50MG/TAB | 30 days supply | Qty: 30 | Fill #0

## 2017-02-06 MED FILL — ATORVASTATIN/20MG/TABS: ATORVASTATIN/20MG/TABS | 30 days supply | Qty: 30 | Fill #7

## 2017-02-06 MED FILL — DESCOVY/200MG/25MG/TABS: DESCOVY/200MG/25MG/TABS | 30 days supply | Qty: 30 | Fill #0

## 2017-02-06 MED FILL — BACLOFEN/10MG/TABS: BACLOFEN/10MG/TABS | 30 days supply | Qty: 30 | Fill #2

## 2017-02-13 MED ORDER — TIVICAY 50 MG TABLET
ORAL_TABLET | PRN refills | 0 days | Status: CP
Start: 2017-02-13 — End: 2017-04-02

## 2017-02-13 MED ORDER — DOLUTEGRAVIR 50 MG TABLET
ORAL | 99 refills | 0 days
Start: 2017-02-13 — End: 2017-02-13

## 2017-02-23 ENCOUNTER — Ambulatory Visit
Admission: RE | Admit: 2017-02-23 | Discharge: 2017-02-23 | Disposition: A | Discharge status: Home / Self Care | Payer: MEDICARE

## 2017-02-23 DIAGNOSIS — N183 Chronic kidney disease, stage 3 (moderate): Principal | ICD-10-CM

## 2017-02-23 LAB — RENAL FUNCTION PANEL
ALBUMIN: 4.6 g/dL (ref 3.5–5.0)
ANION GAP: 10 mmol/L (ref 9–15)
BLOOD UREA NITROGEN: 28 mg/dL — ABNORMAL HIGH (ref 7–21)
BUN / CREAT RATIO: 12
CALCIUM: 10.2 mg/dL (ref 8.5–10.2)
CHLORIDE: 111 mmol/L — ABNORMAL HIGH (ref 98–107)
CO2: 23 mmol/L (ref 22.0–30.0)
CREATININE: 2.3 mg/dL — ABNORMAL HIGH (ref 0.70–1.30)
EGFR MDRD AF AMER: 35 mL/min/{1.73_m2} — ABNORMAL LOW (ref >=60)
EGFR MDRD NON AF AMER: 29 mL/min/{1.73_m2} — ABNORMAL LOW (ref >=60)
GLUCOSE RANDOM: 126 mg/dL (ref 65–179)
POTASSIUM: 4.6 mmol/L (ref 3.5–5.0)
SODIUM: 144 mmol/L (ref 135–145)

## 2017-02-23 LAB — EGFR MDRD NON AF AMER
Glomerular filtration rate/1.73 sq M.predicted.non black:ArVRat:Pt:Ser/Plas/Bld:Qn:Creatinine-based formula (MDRD): 29 — ABNORMAL LOW

## 2017-02-24 MED ORDER — CARVEDILOL 12.5 MG TABLET
ORAL_TABLET | 9 refills | 0 days | Status: CP
Start: 2017-02-24 — End: 2018-04-19

## 2017-03-05 NOTE — Unmapped (Signed)
[facility] Specialty Pharmacy Refill and Clinical Coordination Note: HIV     HIV Medication(s): Descovy and Tivicay  Additional Medication(s): atorvastatin and baclofen    Samuel [name], DOB: 01/05/1951  Phone: [phone] (home) , Alternate phone contact: N/A  Shipping address: [address]  [place] [address]  Phone or address changes today?: No  All above HIPAA information verified.  Insurance changes? No    Completed refill and clinical call assessment today to schedule patient's medication shipment from the [facility] [phone]).      MEDICATION RECONCILIATION    Confirmed the medication and dosage are correct and have not changed: Yes, regimen is correct and unchanged.    Were there any changes to your medication(s) in the past month:  No, there are no changes reported at this time.    HIV-related labs:  Lab Results   Component Value Date/Time    HIVRS Detected (A) 09/16/2016 11:05 AM    HIVRS Not Detected 06/10/2016 10:29 AM    HIVRS Detected (A) 03/11/2016 01:27 PM    HIVRS Not Detected 08/15/2014 11:42 AM    HIVRS Not Detected 01/03/2014 10:46 AM    HIVRS Not Detected 11/23/2012 12:53 PM    HIVCP <40 (H) 09/16/2016 11:05 AM    HIVCP <40 (H) 03/11/2016 01:27 PM    HIV10  09/16/2016 11:05 AM      Comment:      <1.6 log    HIV10  03/11/2016 01:27 PM      Comment:      <1.6 log    HIVCM  09/16/2016 11:05 AM      Comment:      HIV-1 quantification by real-time RT-PCR is performed using the Abbott RealTime  HIV-1 test. This test is FDA approved and can quantitate HIV-1 RNA over the  range of 40 - 1,000,000 copies/mL (1.60 log(10) -6.00 log(10) copies/mL). The reference range for this assay is Not Detected.    HIVCM  06/10/2016 10:29 AM      Comment:      HIV-1 quantification by real-time RT-PCR is performed using the Abbott RealTime  HIV-1 test. This test is FDA approved and can quantitate HIV-1 RNA over the  range of 40 - 1,000,000 copies/mL (1.60 log(10) -6.00 log(10) copies/mL). The reference range for this assay is Not Detected.    HIVCM  03/11/2016 01:27 PM      Comment:      HIV-1 quantification by real-time RT-PCR is performed using the Abbott RealTime  HIV-1 test. This test is FDA approved and can quantitate HIV-1 RNA over the  range of 40 - 1,000,000 copies/mL (1.60 log(10) -6.00 log(10) copies/mL). The reference range for this assay is Not Detected.    HIVCM : 08/15/2014 11:42 AM    HIVCM : 01/03/2014 10:46 AM    HIVCM : 11/23/2012 12:53 PM    ACD4 547 09/16/2016 11:05 AM    ACD4 551 03/11/2016 01:27 PM    ACD4 487 (L) 10/16/2015 11:08 AM    ACD4 514 08/01/2014 09:35 AM    ACD4 430 (L) 01/03/2014 10:46 AM    ACD4 549 11/23/2012 12:53 PM         ADHERENCE      Number of tablets of HIV Medication(s) at home: 7 tablets of both Descovy and Tivicay    Did you miss any doses in the past 4 weeks? No missed doses reported.  Adherence counseling provided? Not needed     SIDE EFFECT MANAGEMENT  Are you tolerating your medication?:  Samuel [name] reports tolerating the medication.  Side effect management discussed: None      Therapy is appropriate and should be continued.      FINANCIAL/SHIPPING    Delivery Scheduled: Yes, Expected medication delivery date: 03/09/17   Additional medications refilled: No additional medications/refills needed at this time.    Javonte did not have any additional questions at this time.    Delivery address validated in [facility] scheduling system: Yes, address listed above is correct.      We will follow up with patient monthly for standard refill processing and delivery.      Thank you,  Roderic Palau   [facility] Shared [facility] Specialty Pharmacist

## 2017-03-08 MED FILL — DESCOVY/200MG/25MG/TABS: DESCOVY/200MG/25MG/TABS | 30 days supply | Qty: 30 | Fill #1

## 2017-03-08 MED FILL — ATORVASTATIN/20MG/TABS: ATORVASTATIN/20MG/TABS | 30 days supply | Qty: 30 | Fill #8

## 2017-03-08 MED FILL — TIVICAY/50MG/TAB: TIVICAY/50MG/TAB | 30 days supply | Qty: 30 | Fill #1

## 2017-03-09 MED ORDER — BACLOFEN 10 MG TABLET: tablet | 0 refills | 0 days | Status: AC

## 2017-03-09 MED ORDER — BACLOFEN 10 MG TABLET
ORAL_TABLET | ORAL | 99 refills | 0.00000 days | Status: CP
Start: 2017-03-09 — End: 2017-03-09

## 2017-03-09 MED FILL — BACLOFEN/10MG/TABS: BACLOFEN/10MG/TABS | 30 days supply | Qty: 30 | Fill #0

## 2017-03-17 ENCOUNTER — Encounter: Payer: Self-pay | Admitting: Emergency Medicine

## 2017-03-17 ENCOUNTER — Emergency Department
Admission: EM | Admit: 2017-03-17 | Discharge: 2017-03-17 | Disposition: A | Discharge status: Home / Self Care | Payer: Medicare Other | Attending: Emergency Medicine | Admitting: Emergency Medicine

## 2017-03-17 DIAGNOSIS — I5032 Chronic diastolic (congestive) heart failure: Secondary | ICD-10-CM | POA: Diagnosis not present

## 2017-03-17 DIAGNOSIS — I130 Hypertensive heart and chronic kidney disease with heart failure and stage 1 through stage 4 chronic kidney disease, or unspecified chronic kidney disease: Secondary | ICD-10-CM | POA: Insufficient documentation

## 2017-03-17 DIAGNOSIS — N183 Chronic kidney disease, stage 3 (moderate): Secondary | ICD-10-CM | POA: Insufficient documentation

## 2017-03-17 DIAGNOSIS — Z79899 Other long term (current) drug therapy: Secondary | ICD-10-CM | POA: Insufficient documentation

## 2017-03-17 DIAGNOSIS — I2510 Atherosclerotic heart disease of native coronary artery without angina pectoris: Secondary | ICD-10-CM | POA: Insufficient documentation

## 2017-03-17 DIAGNOSIS — M5441 Lumbago with sciatica, right side: Secondary | ICD-10-CM | POA: Diagnosis not present

## 2017-03-17 DIAGNOSIS — M25551 Pain in right hip: Secondary | ICD-10-CM | POA: Diagnosis present

## 2017-03-17 DIAGNOSIS — Z87891 Personal history of nicotine dependence: Secondary | ICD-10-CM | POA: Insufficient documentation

## 2017-03-17 MED ORDER — PREDNISONE 50 MG PO TABS: ORAL_TABLET | ORAL | 0 refills | Status: DC

## 2017-03-17 MED ORDER — ORPHENADRINE CITRATE 30 MG/ML IJ SOLN
30.0000 mg | Freq: Two times a day (BID) | INTRAMUSCULAR | Status: DC
  Administered 2017-03-17: 30 mg via INTRAVENOUS
  Filled 2017-03-17: qty 2

## 2017-03-17 MED ORDER — METHYLPREDNISOLONE SODIUM SUCC 125 MG IJ SOLR
125.0000 mg | Freq: Once | INTRAMUSCULAR | Status: AC
  Administered 2017-03-17: 125 mg via INTRAVENOUS
  Filled 2017-03-17: qty 2

## 2017-03-17 NOTE — ED Triage Notes (Signed)
Pt c/o right sided hip pain that radiates into right leg when ambulating. Pt denies injury to area but reports HX of chronic lumbar back pain due to bulging disc. Pt has appointment with Dr. Aris Lot the end of month but pt to ED due to increased and unmanaged pain in right hip area.

## 2017-03-17 NOTE — ED Provider Notes (Signed)
[facility] Emergency Department Provider Note  ____________________________________________  Time seen: Approximately 9:07 PM  I have reviewed the triage vital signs and the nursing notes.   HISTORY  Chief Complaint Hip Pain    HPI Jared Walker is a 66 y.o. male presents to the emergency department with 10 out of 10" right hip pain". Patient reports that his pain radiates into right buttocks. Patient reports that he has a history of chronic low back pain. Denies new falls or incidences of trauma. He denies weakness or changes in sensation of the lower extremities. Patient has been ambulating.Patient has been taking gabapentin but has attempted no other alleviating measures. Patient has an appointment with Dr. Aris Lot at the end of the month.    Past Medical History:  Diagnosis Date  . Anemia   . Aortic stenosis, mild    mild AS by echo, 08/2011  . Chronic kidney disease    kidney stones  . Coronary artery disease   . Depression   . Full dentures    upper and lower  . Gout   . Heart murmur   . Hep C w/o coma, chronic ([place]) 01/19/2015  . Hepatitis C 09/25/11  . HIV positive ([place]) 09/25/11  . Hypertension   . MI (myocardial infarction) [facility])     Patient Active Problem List   Diagnosis Date Noted  . Acute encephalopathy 11/30/2016  . Dyspnea 03/28/2016  . Elevated troponin 03/28/2016  . Acute on chronic systolic CHF (congestive heart failure) ([place]) 03/28/2016  . Swelling of lower extremity 03/28/2016  . HIV disease ([place]) 03/28/2016  . Acute on chronic diastolic CHF (congestive heart failure) ([place])   . Cirrhosis of liver without ascites ([place])   . Chronic renal failure in pediatric patient, stage 3 (moderate) ([place])   . BP (high blood pressure) 12/11/2015  . Acute non-ST elevation myocardial infarction (NSTEMI) ([place]) 11/13/2015  . Chest pain 11/09/2015  . Bradycardia 11/09/2015  . Abnormal EKG 11/09/2015  . Iron deficiency anemia 08/27/2015  .  Musculoskeletal chest pain 01/19/2015  . Anemia 01/19/2015  . GI bleed 01/19/2015  . Pica in adults 01/19/2015  . CKD (chronic kidney disease), stage III ([place]) 01/19/2015  . HIV (human immunodeficiency virus infection) ([place]) 01/19/2015  . HTN (hypertension) 01/19/2015  . Chronic hepatitis C without hepatic coma ([place]) 01/19/2015  . CKD (chronic kidney disease) 01/19/2015  . History of arthroscopy of shoulder 11/13/2014    Past Surgical History:  Procedure Laterality Date  . BACK SURGERY    . CARDIOVASCULAR STRESS TEST     at [facility]  . CHOLECYSTECTOMY    . CLOSED REDUCTION PELVIC FRACTURE    . ESOPHAGOGASTRODUODENOSCOPY (EGD) WITH PROPOFOL N/A 01/21/2015   Procedure: ESOPHAGOGASTRODUODENOSCOPY (EGD) WITH PROPOFOL;  Surgeon: Manya Silvas, MD;  Location: [facility] ENDOSCOPY;  Service: Endoscopy;  Laterality: N/A;  . ESOPHAGOGASTRODUODENOSCOPY (EGD) WITH PROPOFOL N/A 09/04/2015   Procedure: ESOPHAGOGASTRODUODENOSCOPY (EGD) WITH PROPOFOL;  Surgeon: Manya Silvas, MD;  Location: [facility] ENDOSCOPY;  Service: Endoscopy;  Laterality: N/A;  . FRACTURE SURGERY    . KNEE ARTHROSCOPY  2009   Right  . LUMBAR LAMINECTOMY/DECOMPRESSION MICRODISCECTOMY  09/25/2011   Procedure: LUMBAR LAMINECTOMY/DECOMPRESSION MICRODISCECTOMY;  Surgeon: Erline Levine, MD;  Location: [place] NEURO ORS;  Service: Neurosurgery;  Laterality: Left;  Left Lumbar Four-Five Microdiskectomy  . NEPHROSTOMY     tube Left  . SHOULDER ARTHROSCOPY WITH OPEN ROTATOR CUFF REPAIR Right 10/12/2014   Procedure: SHOULDER ARTHROSCOP with decompression;  Surgeon: Leanor Kail, MD;  Location: [place];  Service: Orthopedics;  Laterality: Right;    Prior to Admission medications   Medication Sig Start Date End Date Taking? Authorizing Provider  albuterol (PROVENTIL HFA;VENTOLIN HFA) 108 (90 Base) MCG/ACT inhaler Inhale 2 puffs into the lungs every 6 (six) hours as needed for wheezing or shortness of breath. 08/12/16   Lavonia Drafts, MD   allopurinol (ZYLOPRIM) 100 MG tablet Take 200 mg by mouth daily. *Take along with 300 mg tablet to equal total dose of 500 mg daily.*    [provider]  amLODipine (NORVASC) 10 MG tablet Take 10 mg by mouth. 11/13/15 11/30/16  [provider]  aspirin EC 81 MG EC tablet Take 1 tablet (81 mg total) by mouth daily. 11/11/15   Bettey Costa, MD  atorvastatin (LIPITOR) 10 MG tablet Take 80 mg by mouth. 11/13/15 11/30/16  [provider]  carvedilol (COREG) 12.5 MG tablet Take 12.5 mg by mouth 2 (two) times daily with a meal.    [provider]  Cholecalciferol (VITAMIN D-1000 MAX ST) 1000 units tablet Take 1,000 Units by mouth daily.    [provider]  clopidogrel (PLAVIX) 75 MG tablet Take 75 mg by mouth daily.    [provider]  DESCOVY 200-25 MG tablet Take 1 tablet by mouth at bedtime.  08/12/15   [provider]  diazepam (VALIUM) 5 MG tablet Take 5 mg by mouth at bedtime. For sleep    [provider]  docusate sodium (COLACE) 100 MG capsule Take 100 mg by mouth at bedtime.  05/24/14   [provider]  dolutegravir (TIVICAY) 50 MG tablet Take 50 mg by mouth at bedtime.  07/31/15   [provider]  ferrous sulfate 325 (65 FE) MG tablet Take 325 mg by mouth 2 (two) times daily. 01/10/15 11/30/16  [provider]  folic acid (FOLVITE) 1 MG tablet Take 1 mg by mouth every evening.     [provider]  isosorbide mononitrate (IMDUR) 30 MG 24 hr tablet Take 1 tablet (30 mg total) by mouth daily. 03/29/16   Theodoro Grist, MD  losartan (COZAAR) 100 MG tablet Take 100 mg by mouth every morning.  10/31/15   [provider]  omeprazole (PRILOSEC) 40 MG capsule Take 40 mg by mouth every morning.  11/06/15   [provider]  oxyCODONE-acetaminophen (PERCOCET/ROXICET) 5-325 MG tablet Take 1 tablet by mouth 2 (two) times daily.    [provider]  predniSONE (DELTASONE) 50 MG tablet Take one  tablet daily by mouth for the next five days. 03/17/17   Lannie Fields, PA-C  sertraline (ZOLOFT) 50 MG tablet Take 50 mg by mouth every evening.     [provider]  torsemide (DEMADEX) 20 MG tablet Take 1 tablet (20 mg total) by mouth daily. 03/28/16   Theodoro Grist, MD  vitamin B-12 (CYANOCOBALAMIN) 1000 MCG tablet Take 1,000 mcg by mouth daily.    [provider]    Allergies Buchu-cornsilk-ch grass-hydran; Colchicine; Nifedipine; Oxycodone-acetaminophen; and Tramadol  Family History  Problem Relation Age of Onset  . Hypertension Mother   . Cancer Father   . Heart disease Sister   . Diabetes Brother   . Anesthesia problems Neg Hx     Social History Social History  Substance Use Topics  . Smoking status: Former Research scientist (life sciences)  . Smokeless tobacco: Former Systems developer    Quit date: 03/13/1972  . Alcohol use No     Review of Systems  Constitutional:  No fever/chills Eyes: No visual changes. No discharge ENT: No upper respiratory complaints. Cardiovascular: no chest pain. Respiratory: no cough. No SOB. Gastrointestinal: No abdominal pain.  No nausea, no vomiting.  No diarrhea.  No constipation. Musculoskeletal: Patient has low back pain. Skin: Negative for rash, abrasions, lacerations, ecchymosis. Neurological: Negative for headaches, focal weakness or numbness.   ____________________________________________   PHYSICAL EXAM:  VITAL SIGNS: ED Triage Vitals  Enc Vitals Group     BP 03/17/17 1908 (!) 169/80     Pulse Rate 03/17/17 1908 70     Resp 03/17/17 1908 16     Temp 03/17/17 1908 98.1 F (36.7 C)     Temp Source 03/17/17 1908 Oral     SpO2 03/17/17 1908 97 %     Weight 03/17/17 1906 221 lb (100.2 kg)     Height --      Head Circumference --      Peak Flow --      Pain Score --      Pain Loc --      Pain Edu? --      Excl. in [place]? --      Constitutional: Alert and oriented. Well appearing and in no acute distress. Eyes: Conjunctivae are normal.  PERRL. EOMI. Head: Atraumatic. Cardiovascular: Normal rate, regular rhythm. Normal S1 and S2.  Good peripheral circulation. Respiratory: Normal respiratory effort without tachypnea or retractions. Lungs CTAB. Good air entry to the bases with no decreased or absent breath sounds. Musculoskeletal: Full range of motion to all extremities. No gross deformities appreciated. Patient has pain with palpation along the lumbar paraspinal muscles. Neurologic:  Normal speech and language. No gross focal neurologic deficits are appreciated. Positive straight leg raise, right.   Skin:  Skin is warm, dry and intact. No rash noted. Psychiatric: Mood and affect are normal. Speech and behavior are normal. Patient exhibits appropriate insight and judgement.   ____________________________________________   LABS (all labs ordered are listed, but only abnormal results are displayed)  Labs Reviewed - No data to display ____________________________________________  EKG   ____________________________________________  RADIOLOGY   No results found.  ____________________________________________    PROCEDURES  Procedure(s) performed:    Procedures    Medications  methylPREDNISolone sodium succinate (SOLU-MEDROL) 125 mg/2 mL injection 125 mg (not administered)  orphenadrine (NORFLEX) injection 30 mg (not administered)     ____________________________________________   INITIAL IMPRESSION / ASSESSMENT AND PLAN / ED COURSE  Pertinent labs & imaging results that were available during my care of the patient were reviewed by me and considered in my medical decision making (see chart for details).  Review of the [place] CSRS was performed in accordance of the [place] prior to dispensing any controlled drugs.     Assessment and plan Low back pain Patient presents the emergency department with low back pain with right lower extremity radiculopathy. Neurologic exam and overall physical exam is  reassuring. Patient was given Solu-Medrol and Norflex in the emergency department. He was discharged with prednisone. Patient was advised to keep his appointment with Dr. Aris Lot at the end of the month. Vital signs are reassuring prior to discharge. All patient questions were answered.  ____________________________________________  FINAL CLINICAL IMPRESSION(S) / ED DIAGNOSES  Final diagnoses:  Acute right-sided low back pain with right-sided sciatica      NEW MEDICATIONS STARTED DURING THIS VISIT:  New Prescriptions   PREDNISONE (DELTASONE) 50 MG TABLET    Take one tablet daily by mouth for the next five days.  This chart was dictated using voice recognition software/Dragon. Despite best efforts to proofread, errors can occur which can change the meaning. Any change was purely unintentional.    Karren Cobble 03/17/17 2112    Arta Silence, MD 03/17/17 [phone]

## 2017-03-29 ENCOUNTER — Emergency Department
Admission: EM | Admit: 2017-03-29 | Discharge: 2017-03-29 | Disposition: A | Discharge status: Home / Self Care | Payer: Medicare Other | Attending: Emergency Medicine | Admitting: Emergency Medicine

## 2017-03-29 ENCOUNTER — Emergency Department: Payer: Medicare Other

## 2017-03-29 ENCOUNTER — Encounter: Payer: Self-pay | Admitting: Emergency Medicine

## 2017-03-29 DIAGNOSIS — Y9301 Activity, walking, marching and hiking: Secondary | ICD-10-CM | POA: Insufficient documentation

## 2017-03-29 DIAGNOSIS — I5042 Chronic combined systolic (congestive) and diastolic (congestive) heart failure: Secondary | ICD-10-CM | POA: Insufficient documentation

## 2017-03-29 DIAGNOSIS — S40012A Contusion of left shoulder, initial encounter: Secondary | ICD-10-CM | POA: Diagnosis not present

## 2017-03-29 DIAGNOSIS — S96912A Strain of unspecified muscle and tendon at ankle and foot level, left foot, initial encounter: Secondary | ICD-10-CM | POA: Diagnosis not present

## 2017-03-29 DIAGNOSIS — S8002XA Contusion of left knee, initial encounter: Secondary | ICD-10-CM | POA: Diagnosis not present

## 2017-03-29 DIAGNOSIS — I130 Hypertensive heart and chronic kidney disease with heart failure and stage 1 through stage 4 chronic kidney disease, or unspecified chronic kidney disease: Secondary | ICD-10-CM | POA: Insufficient documentation

## 2017-03-29 DIAGNOSIS — S99912A Unspecified injury of left ankle, initial encounter: Secondary | ICD-10-CM | POA: Diagnosis present

## 2017-03-29 DIAGNOSIS — Y999 Unspecified external cause status: Secondary | ICD-10-CM | POA: Insufficient documentation

## 2017-03-29 DIAGNOSIS — N183 Chronic kidney disease, stage 3 (moderate): Secondary | ICD-10-CM | POA: Insufficient documentation

## 2017-03-29 DIAGNOSIS — Y92481 Parking lot as the place of occurrence of the external cause: Secondary | ICD-10-CM | POA: Diagnosis not present

## 2017-03-29 DIAGNOSIS — I2510 Atherosclerotic heart disease of native coronary artery without angina pectoris: Secondary | ICD-10-CM | POA: Diagnosis not present

## 2017-03-29 DIAGNOSIS — Z79899 Other long term (current) drug therapy: Secondary | ICD-10-CM | POA: Diagnosis not present

## 2017-03-29 DIAGNOSIS — Z87891 Personal history of nicotine dependence: Secondary | ICD-10-CM | POA: Insufficient documentation

## 2017-03-29 DIAGNOSIS — W1842XA Slipping, tripping and stumbling without falling due to stepping into hole or opening, initial encounter: Secondary | ICD-10-CM | POA: Insufficient documentation

## 2017-03-29 DIAGNOSIS — W19XXXA Unspecified fall, initial encounter: Secondary | ICD-10-CM

## 2017-03-29 DIAGNOSIS — S60222A Contusion of left hand, initial encounter: Secondary | ICD-10-CM | POA: Insufficient documentation

## 2017-03-29 MED ORDER — ONDANSETRON 8 MG PO TBDP
8.0000 mg | ORAL_TABLET | Freq: Once | ORAL | Status: AC
  Administered 2017-03-29: 8 mg via ORAL
  Filled 2017-03-29: qty 1

## 2017-03-29 MED ORDER — MORPHINE SULFATE (PF) 4 MG/ML IV SOLN
4.0000 mg | Freq: Once | INTRAVENOUS | Status: AC
  Administered 2017-03-29: 4 mg via INTRAMUSCULAR
  Filled 2017-03-29: qty 1

## 2017-03-29 NOTE — ED Provider Notes (Signed)
[facility] Emergency Department Provider Note  ____________________________________________  Time seen: Approximately 3:23 PM  I have reviewed the triage vital signs and the nursing notes.   HISTORY  Chief Complaint Fall; Ankle Pain; Knee Pain; and Shoulder Pain    HPI Jared SCHOEPPNER is a 66 y.o. male who presents emergency department complaining of left hand, left shoulder, left knee, left ankle and foot pain status post a fall.  Patient reports that he was walking, stepped into a hole with his left foot causing it to twist and him to fall.  Patient reports that he caught most of his weight with his left hand but is experiencing pain to the hand, shoulder, knee, ankle, foot.  Patient did not hit his head or lose consciousness.  Patient denies any lacerations or abrasions.  Patient is reporting sharp pain to all locations.  Patient reports that he is able to move his knee and shoulder has limited range of motion to his hand.  Patient is able to bear weight on the left ankle but states that doing so causes an increase in pain.  No other complaints at this time.  Patient has a history of MI, hepatitis, coronary artery disease, chronic kidney disease, aortic stenosis, anemia.  Patient denies any complaints with ongoing medical conditions.  Patient was queried in the [place] controlled substance database with results of ongoing prescriptions of Vicodin and Valium on a monthly basis.  Past Medical History:  Diagnosis Date  . Anemia   . Aortic stenosis, mild    mild AS by echo, 08/2011  . Chronic kidney disease    kidney stones  . Coronary artery disease   . Depression   . Full dentures    upper and lower  . Gout   . Heart murmur   . Hep C w/o coma, chronic ([place]) 01/19/2015  . Hepatitis C 09/25/11  . HIV positive ([place]) 09/25/11  . Hypertension   . MI (myocardial infarction) [facility])     Patient Active Problem List   Diagnosis Date Noted  . Acute  encephalopathy 11/30/2016  . Dyspnea 03/28/2016  . Elevated troponin 03/28/2016  . Acute on chronic systolic CHF (congestive heart failure) ([place]) 03/28/2016  . Swelling of lower extremity 03/28/2016  . HIV disease ([place]) 03/28/2016  . Acute on chronic diastolic CHF (congestive heart failure) ([place])   . Cirrhosis of liver without ascites ([place])   . Chronic renal failure in pediatric patient, stage 3 (moderate) ([place])   . BP (high blood pressure) 12/11/2015  . Acute non-ST elevation myocardial infarction (NSTEMI) ([place]) 11/13/2015  . Chest pain 11/09/2015  . Bradycardia 11/09/2015  . Abnormal EKG 11/09/2015  . Iron deficiency anemia 08/27/2015  . Musculoskeletal chest pain 01/19/2015  . Anemia 01/19/2015  . GI bleed 01/19/2015  . Pica in adults 01/19/2015  . CKD (chronic kidney disease), stage III ([place]) 01/19/2015  . HIV (human immunodeficiency virus infection) ([place]) 01/19/2015  . HTN (hypertension) 01/19/2015  . Chronic hepatitis C without hepatic coma ([place]) 01/19/2015  . CKD (chronic kidney disease) 01/19/2015  . History of arthroscopy of shoulder 11/13/2014    Past Surgical History:  Procedure Laterality Date  . BACK SURGERY    . CARDIOVASCULAR STRESS TEST     at [facility]  . CHOLECYSTECTOMY    . CLOSED REDUCTION PELVIC FRACTURE    . ESOPHAGOGASTRODUODENOSCOPY (EGD) WITH PROPOFOL N/A 01/21/2015   Procedure: ESOPHAGOGASTRODUODENOSCOPY (EGD) WITH PROPOFOL;  Surgeon: Manya Silvas, MD;  Location: [facility];  Service: Endoscopy;  Laterality: N/A;  . ESOPHAGOGASTRODUODENOSCOPY (EGD) WITH PROPOFOL N/A 09/04/2015   Procedure: ESOPHAGOGASTRODUODENOSCOPY (EGD) WITH PROPOFOL;  Surgeon: Manya Silvas, MD;  Location: [facility] ENDOSCOPY;  Service: Endoscopy;  Laterality: N/A;  . FRACTURE SURGERY    . KNEE ARTHROSCOPY  2009   Right  . LUMBAR LAMINECTOMY/DECOMPRESSION MICRODISCECTOMY  09/25/2011   Procedure: LUMBAR LAMINECTOMY/DECOMPRESSION MICRODISCECTOMY;  Surgeon: Erline Levine, MD;  Location:  [place] [facility];  Service: Neurosurgery;  Laterality: Left;  Left Lumbar Four-Five Microdiskectomy  . NEPHROSTOMY     tube Left  . SHOULDER ARTHROSCOPY WITH OPEN ROTATOR CUFF REPAIR Right 10/12/2014   Procedure: SHOULDER ARTHROSCOP with decompression;  Surgeon: Leanor Kail, MD;  Location: [facility];  Service: Orthopedics;  Laterality: Right;    Prior to Admission medications   Medication Sig Start Date End Date Taking? Authorizing Provider  albuterol (PROVENTIL HFA;VENTOLIN HFA) 108 (90 Base) MCG/ACT inhaler Inhale 2 puffs into the lungs every 6 (six) hours as needed for wheezing or shortness of breath. 08/12/16   Lavonia Drafts, MD  allopurinol (ZYLOPRIM) 100 MG tablet Take 200 mg by mouth daily. *Take along with 300 mg tablet to equal total dose of 500 mg daily.*    [provider]  amLODipine (NORVASC) 10 MG tablet Take 10 mg by mouth. 11/13/15 11/30/16  [provider]  aspirin EC 81 MG EC tablet Take 1 tablet (81 mg total) by mouth daily. 11/11/15   Bettey Costa, MD  atorvastatin (LIPITOR) 10 MG tablet Take 80 mg by mouth. 11/13/15 11/30/16  [provider]  carvedilol (COREG) 12.5 MG tablet Take 12.5 mg by mouth 2 (two) times daily with a meal.    [provider]  Cholecalciferol (VITAMIN D-1000 MAX ST) 1000 units tablet Take 1,000 Units by mouth daily.    [provider]  clopidogrel (PLAVIX) 75 MG tablet Take 75 mg by mouth daily.    [provider]  DESCOVY 200-25 MG tablet Take 1 tablet by mouth at bedtime.  08/12/15   [provider]  diazepam (VALIUM) 5 MG tablet Take 5 mg by mouth at bedtime. For sleep    [provider]  docusate sodium (COLACE) 100 MG capsule Take 100 mg by mouth at bedtime.  05/24/14   [provider]  dolutegravir (TIVICAY) 50 MG tablet Take 50 mg by mouth at bedtime.  07/31/15   [provider]  ferrous sulfate 325 (65 FE) MG tablet Take 325 mg by mouth 2 (two) times daily.  01/10/15 11/30/16  [provider]  folic acid (FOLVITE) 1 MG tablet Take 1 mg by mouth every evening.     [provider]  isosorbide mononitrate (IMDUR) 30 MG 24 hr tablet Take 1 tablet (30 mg total) by mouth daily. 03/29/16   Theodoro Grist, MD  losartan (COZAAR) 100 MG tablet Take 100 mg by mouth every morning.  10/31/15   [provider]  omeprazole (PRILOSEC) 40 MG capsule Take 40 mg by mouth every morning.  11/06/15   [provider]  oxyCODONE-acetaminophen (PERCOCET/ROXICET) 5-325 MG tablet Take 1 tablet by mouth 2 (two) times daily.    [provider]  predniSONE (DELTASONE) 50 MG tablet Take one tablet daily by mouth for the next five days. 03/17/17   Lannie Fields, PA-C  sertraline (ZOLOFT) 50 MG tablet Take 50 mg by mouth every evening.     [provider]  torsemide (DEMADEX) 20 MG tablet Take 1 tablet (20 mg total) by  mouth daily. 03/28/16   Theodoro Grist, MD  vitamin B-12 (CYANOCOBALAMIN) 1000 MCG tablet Take 1,000 mcg by mouth daily.    [provider]    Allergies Buchu-cornsilk-ch grass-hydran; Colchicine; Nifedipine; Oxycodone-acetaminophen; and Tramadol  Family History  Problem Relation Age of Onset  . Hypertension Mother   . Cancer Father   . Heart disease Sister   . Diabetes Brother   . Anesthesia problems Neg Hx     Social History Social History  Substance Use Topics  . Smoking status: Former Research scientist (life sciences)  . Smokeless tobacco: Former Systems developer    Quit date: 03/13/1972  . Alcohol use No     Review of Systems  Constitutional: No fever/chills Eyes: No visual changes. No discharge ENT: No upper respiratory complaints. Cardiovascular: no chest pain. Respiratory: no cough. No SOB. Gastrointestinal: No abdominal pain.  No nausea, no vomiting.   Musculoskeletal: Positive for left hand, left shoulder, left knee, left ankle pain. Skin: Negative for rash, abrasions, lacerations, ecchymosis. Neurological: Negative  for headaches, focal weakness or numbness. 10-point ROS otherwise negative.  ____________________________________________   PHYSICAL EXAM:  VITAL SIGNS: ED Triage Vitals  Enc Vitals Group     BP 03/29/17 1415 (!) 153/74     Pulse Rate 03/29/17 1415 63     Resp 03/29/17 1415 16     Temp 03/29/17 1415 98.2 F (36.8 C)     Temp Source 03/29/17 1415 Oral     SpO2 03/29/17 1415 98 %     Weight 03/29/17 1415 221 lb (100.2 kg)     Height 03/29/17 1415 5\' 11"  (1.803 m)     Head Circumference --      Peak Flow --      Pain Score 03/29/17 1414 9     Pain Loc --      Pain Edu? --      Excl. in [place]? --      Constitutional: Alert and oriented. Well appearing and in no acute distress. Eyes: Conjunctivae are normal. PERRL. EOMI. Head: Atraumatic. ENT:      Ears:       Nose: No congestion/rhinnorhea.      Mouth/Throat: Mucous membranes are moist.  Neck: No stridor.  No cervical spine tenderness to palpation.  Cardiovascular: Normal rate, regular rhythm. Normal S1 and S2.  Good peripheral circulation. Respiratory: Normal respiratory effort without tachypnea or retractions. Lungs CTAB. Good air entry to the bases with no decreased or absent breath sounds. Musculoskeletal: Full range of motion to all extremities. No gross deformities appreciated.  No gross deformities to left shoulder upon inspection.  Full range of motion with coaxing.  Patient is tender diffusely over the anterior and posterior aspect of the shoulder.  He is also tender to palpation over the lateral shoulder joint.  No palpable abnormalities.  Examination of the cervical spine and elbows unremarkable.  Patient with reported limited range of motion to left hand, but while examining other muscular skeletal complaints, patient is moving hand and wrist appropriately.  Mild edema noted to the palmar aspect of the left hand.  No deformity.  Mild ecchymosis noted to the palmar aspect of the left hand.  Patient is very tender to  palpation diffusely over the carpal bones with no specific point tenderness.  No palpable abnormality.  Sensation and cap refill intact all digits.  Examination of the left knee reveals no edema, abrasion, ecchymosis.  Full range of motion to the left knee.  Patient is diffusely tender to palpation of  the anterior and bilateral aspects of the knee.  No specific point tenderness.  No palpable abnormality.  Varus, valgus, Lachman's, McMurray's is negative.  Examination of the left ankle reveals no edema, ecchymosis, deformity.  Full range of motion to the ankle.  Patient is diffusely tender to palpation over bilateral malleolus, anterior talonavicular joint line.  No palpable abnormality.  Patient is diffusely tender to palpation throughout the tarsal bones with no specific point tenderness.  No palpable abnormality.  Dorsalis pedis pulse intact.  Sensation intact all 5 digits left foot. Neurologic:  Normal speech and language. No gross focal neurologic deficits are appreciated.  Skin:  Skin is warm, dry and intact. No rash noted. Psychiatric: Mood and affect are normal. Speech and behavior are normal. Patient exhibits appropriate insight and judgement.   ____________________________________________   LABS (all labs ordered are listed, but only abnormal results are displayed)  Labs Reviewed - No data to display ____________________________________________  EKG   ____________________________________________  RADIOLOGY Diamantina Providence Toula Miyasaki, personally viewed and evaluated these images (plain radiographs) as part of my medical decision making, as well as reviewing the written report by the radiologist.  Dg Ankle Complete Left  Result Date: 03/29/2017 CLINICAL DATA:  Left ankle pain secondary to a fall. EXAM: LEFT ANKLE COMPLETE - 3+ VIEW COMPARISON:  03/22/2005 FINDINGS: There is no evidence of fracture, dislocation, or joint effusion. There are chronic degenerative changes in the medial  aspect of the joint. There is a small osteochondral lesion of the lateral aspect of the dome of the talus, essentially unchanged. Slight degenerative changes in the lateral aspect of the ankle joint. Soft tissues are unremarkable. IMPRESSION: No acute abnormality.  Chronic arthritic changes of the left ankle. Electronically Signed   By: [name] M.D.   On: 03/29/2017 16:21   Dg Shoulder Left  Result Date: 03/29/2017 CLINICAL DATA:  Fall, left shoulder injury, initial encounter. EXAM: LEFT SHOULDER - 2+ VIEW COMPARISON:  None. FINDINGS: Tiny osseous fragment adjacent to the greater tuberosity of the humerus appears generally well corticated. Difficult to exclude a tiny avulsion fracture, however. Otherwise, no evidence of an acute fracture. Mild degenerative changes in the left acromioclavicular joint. Visualized portion of the left chest is unremarkable. IMPRESSION: 1. Difficult to exclude a tiny avulsion fracture off the greater tuberosity of the left humerus. 2. Mild left acromioclavicular joint osteoarthritis. Electronically Signed   By: Lorin Picket M.D.   On: 03/29/2017 16:15   Dg Knee Complete 4 Views Left  Result Date: 03/29/2017 CLINICAL DATA:  Pain secondary to a fall. EXAM: LEFT KNEE - COMPLETE 4+ VIEW COMPARISON:  Radiographs dated 12/20/2005 FINDINGS: No evidence of fracture, dislocation, or joint effusion. Minimal osteophyte formation on the patella. No joint effusion. Soft tissues are unremarkable. IMPRESSION: No acute abnormality of the left knee. Electronically Signed   By: [name] M.D.   On: 03/29/2017 16:15   Dg Hand Complete Left  Result Date: 03/29/2017 CLINICAL DATA:  Left hand pain secondary to a fall. EXAM: LEFT HAND - COMPLETE 3+ VIEW COMPARISON:  None. FINDINGS: There is no evidence of fracture or dislocation. There is moderately severe arthritis at the first MCP joint with lesser arthritis at the seconds MCP joint. Minimal arthritic changes of the IP joints.  Soft tissues are unremarkable. IMPRESSION: No acute abnormality.  Arthritic changes as described. Electronically Signed   By: [name] M.D.   On: 03/29/2017 16:17   Dg Foot Complete Left  Result Date: 03/29/2017 CLINICAL  DATA:  Left foot pain secondary to a fall. EXAM: LEFT FOOT - COMPLETE 3+ VIEW COMPARISON:  Ankle radiographs dated 03/22/2005 FINDINGS: There is no evidence of fracture or dislocation. Slight arthritis at the first MTP joint. Soft tissues are unremarkable. IMPRESSION: No acute abnormality. Electronically Signed   By: [name] M.D.   On: 03/29/2017 16:19    ____________________________________________    PROCEDURES  Procedure(s) performed:    Procedures    Medications  ondansetron (ZOFRAN-ODT) disintegrating tablet 8 mg (8 mg Oral Given 03/29/17 1708)  morphine 4 MG/ML injection 4 mg (4 mg Intramuscular Given 03/29/17 1708)     ____________________________________________   INITIAL IMPRESSION / ASSESSMENT AND PLAN / ED COURSE  Pertinent labs & imaging results that were available during my care of the patient were reviewed by me and considered in my medical decision making (see chart for details).  Review of the [place] CSRS was performed in accordance of the [place] prior to dispensing any controlled drugs.     Patient's diagnosis is consistent with all resulting in contusion of the left shoulder, hand, knee as well as sprain.  Differential included fractures of the shoulder, hand, knee, ankle.  X-rays returned with reassuring results.  Possible avulsion fracture to the left shoulder but otherwise no acute osseous abnormalities.  Patient was moving all joints and left hand appropriately on my return to discuss results with the patient.  At this time, patient is given injection of pain medicine and discharged with no new prescriptions this patient is on chronic pain medication and unable to take NSAIDs.. Patient is to follow up with primary care or  orthopedics as needed or otherwise directed. Patient is given ED precautions to return to the ED for any worsening or new symptoms.     ____________________________________________  FINAL CLINICAL IMPRESSION(S) / ED DIAGNOSES  Final diagnoses:  Fall, initial encounter  Strain of left ankle, initial encounter  Contusion of left knee, initial encounter  Contusion of left shoulder, initial encounter  Contusion of left hand, initial encounter      NEW MEDICATIONS STARTED DURING THIS VISIT:  New Prescriptions   No medications on file        This chart was dictated using voice recognition software/Dragon. Despite best efforts to proofread, errors can occur which can change the meaning. Any change was purely unintentional.    Darletta Moll, PA-C 03/29/17 1713    Carrie Mew, MD 03/29/17 [phone]

## 2017-03-29 NOTE — ED Triage Notes (Signed)
Says he was in parking lot about 1 hour ago and stepped in hole with left foot causing him to fall.  Says he has pain in left ankle, right knee, left shoulder and left and right hands.

## 2017-03-31 ENCOUNTER — Other Ambulatory Visit: Payer: Self-pay | Admitting: Internal Medicine

## 2017-03-31 DIAGNOSIS — M25512 Pain in left shoulder: Secondary | ICD-10-CM

## 2017-03-31 DIAGNOSIS — Z9181 History of falling: Secondary | ICD-10-CM

## 2017-04-01 NOTE — Unmapped (Signed)
Assessment/Plan:            .   HIV:  Viral load well controlled on current ART (Descovy/dolutegravir), which he tolerates well. We discussed labs.  Will continue current regimen. Cirrhosis and renal insufficiency are his biggest current medical problems.    S/p L shoulder injury: MRI pending per pt. Followed by his PCP.    Cirrhosis: Followed by GI.     SVR following Zepatier for Chronic HCV Infection (genotype 1a): Stable.    CKD: Stage 3 -- Followed by Dr Stefano Gaul.    HTN: Stable.    Sexual Health:     Mental Health: Stable.    Health maintenance:   Pap N/A  RPR False + (1:2 with neg FTA 11/12/2015)  GC/CT - neg 08/26/2016  UA 01/10/2016: 30 mg protein protein/creat ratio 0.162  Hb A1C 11/12/2015: 5.7   Cholesterol 11/12/2015: total cholesterol 148, HDL 37, LDL (calc) 80, triglycerides 155  TB screening neg, 06/10/2016  Lung cancer screening N/A (quit 1993 after 1 ppd x 10 yrs)  Colonoscopy - Requested for screening        Prevention, adherence and health education:   .  .  .  .  .  .  .    Educational and counseling services took 25  minutes of today's visit.    Follow-up:  Return to clinic in 1 month or sooner if needed.   Subjective:    Samuel [name] is a 66 y.o. male who presents to the Infectious Disease clinic for return HIV visit.     Chief Complaint: HIV Positive/AIDS and Routine Follow-up      HPI:   HIV diagnosed in 12/1999. In 0120/02, CD4 count was 547 (25%) with a viral load of about 5293. For a number of years, his viral load and CD4 count were generally reasonably well maintained in the absence of antiretroviral therapy, and he never had any HIV-related symptoms. However, during 2008-2009, his viral load dramatically increased and his CD4 count fell. Viral load reached a height of 309,000 on 08/31/2007 and CD4 count fell substantially to 244 on 09/29/2007. He had always been reluctant to take antiviral medications and in fact consistently and repeatedly refused hepatitis C treatment as well as HIV treatment in the past. However, because of the deterioration of his CD4 count and viral load, I prescribed Atripla on 10/12/07. Although he agreed to take it he did not done so until more than a year later, despite assurances during each visit that he would begin ART immediately. Once he finally started ART, however, he tolerated it quite well. On 07/24/2015 he was switched from Atripla to Descovy/dolutegravir b/o CKD.     His HIV has been well controlled, but he has multiple severe comorbidities, including HCV (SVR after 12 wks of Zepatier 04/20/2016-07/13/2016; HCV RNA BDL 10/28/2016), resultant cirrhosis (Metavir F4), CKD Stage 3, NSTEMI 10/2015. He is followed closely by Nephrology, GI, and Cardiology. His course has been eventful during the past few months. During 10/2016 he experiencedincreased creatinine (to 3.75), hypervolemia, abdominal distention -- felt likely due to worsened cirrhosis.      Larey Seat because of a pot hole four days ago and sustained a chipped L shoulder. Went to ER; also seen by his PCP. MRI of L shoulder reportedly scheduled. Still feels sore, but otherwise systemically well. Has missed no ART doses.    ROS otherwise neg.       Past Medical History:   Diagnosis Date   ???  CKD (chronic kidney disease) stage 3, GFR 30-59 ml/min 01/08/2015   ??? Coronary artery disease 10/2015    Multivessel disease, plan medical management after cath 6/17   ??? GERD (gastroesophageal reflux disease)    ??? Gout    ??? HIV (human immunodeficiency virus infection) (CMS-HCC)     Undectable viral load 5/17   ??? Hypertension    ??? Nephrolithiasis    ??? Nephrotic syndrome 03/05/2014   ??? Renal mass     Followed by neprhology, MRI 8/16 improved, felt to be benign       Medications:  Current Outpatient Prescriptions   Medication Sig Dispense Refill   ??? albuterol (PROVENTIL HFA;VENTOLIN HFA) 90 mcg/actuation inhaler Inhale 2 puffs.     ??? allopurinol (ZYLOPRIM) 300 MG tablet Take 300 mg by mouth daily.   0   ??? amLODIPine (NORVASC) 2.5 MG tablet Take 1 tablet (2.5 mg total) by mouth every morning before breakfast. 90 tablet 3   ??? aspirin (ECOTRIN) 81 MG tablet   0   ??? atorvastatin (LIPITOR) 20 MG tablet TAKE 1 TABLET (20 MG) BY MOUTH ONCE DAILY 30 tablet PRN   ??? baclofen (LIORESAL) 10 MG tablet TAKE A HALF TABLET (5 MG) BY MOUTH TWICE DAILY AS NEEDED 30 tablet PRN   ??? calcium carbonate-vitamin D2 500 mg(1,250mg ) -200 unit tablet Take 1 tablet by mouth Two (2) times a day.     ??? carvedilol (COREG) 12.5 MG tablet TAKE 1 TABLET BY MOUTH TWICE DAILY 60 tablet 9   ??? cyanocobalamin 1000 MCG tablet Take 1,000 mcg by mouth.     ??? diazepam (VALIUM) 5 MG tablet Take 5 mg by mouth nightly.      ??? dolutegravir (TIVICAY) 50 mg Tab TABLET Take 1 tablet (50 mg total) by mouth daily. 30 tablet 11   ??? emtricitabine-tenofovir alafen (DESCOVY) 200-25 mg tablet Take 1 tablet by mouth daily. 30 tablet 11   ??? ferrous sulfate 325 (65 FE) MG tablet Take 325 mg by mouth daily with breakfast.      ??? folic acid (FOLVITE) 1 MG tablet   0   ??? glimepiride (AMARYL) 1 MG tablet take 1 tablet by mouth once daily with BREAKFAST  0   ??? HYDROcodone-acetaminophen (NORCO) 5-325 mg per tablet Directions are take one tablet 30 minutes before physical therapy, then take one tablet at bedtime as needed for pain     ??? nitroglycerin (NITROSTAT) 0.4 MG SL tablet place 1 tablet under the tongue if needed every 5 minutes for chest pain 25 tablet 1   ??? omega-3 fatty acids-fish oil 340-1,000 mg capsule Take 1 capsule by mouth.     ??? ondansetron (ZOFRAN) 4 MG tablet take 1 tablet by mouth every 8 hours if needed for nausea     ??? oxyCODONE-acetaminophen (PERCOCET) 5-325 mg per tablet Take 1 tablet by mouth.     ??? pantoprazole (PROTONIX) 40 MG tablet Take 40 mg by mouth daily.     ??? predniSONE (DELTASONE) 50 MG tablet Take one tablet daily by mouth for the next five days.     ??? sertraline (ZOLOFT) 50 MG tablet take 1 tablet by mouth once daily     ??? spironolactone (ALDACTONE) 25 MG tablet Take 2 tablets (50 mg total) by mouth daily. 90 tablet 3   ??? torsemide (DEMADEX) 20 MG tablet Take 2 tablets (40 mg total) by mouth daily. 90 tablet 3   ??? docusate sodium (COLACE) 100 MG capsule Take 1 capsule (  100 mg total) by mouth every twelve (12) hours. (Patient taking differently: Take 100 mg by mouth two (2) times a day as needed. ) 60 capsule 0   ??? pregabalin (LYRICA) 25 MG capsule Take 1 capsule (25 mg total) by mouth once daily. 30 capsule 2     No current facility-administered medications for this visit.        Allergies: Colchicine analogues and Tramadol    Social History:  As per MEDICAL RECORD NUMBERNo cigs or alcohol at all. No current IDU (quit many years ago). Lives with wife.     Review of Systems:  A 12 point review of systems was negative except for pertinent items noted in the HPI.    Objective:       BP 160/96  - Pulse 74  - Temp 36.8 ??C (98.2 ??F)  - Resp 14  - Ht 177.8 cm (5' 10)  - Wt (!) 104.3 kg (230 lb)  - BMI 33.00 kg/m??     GEN:  looks well, no apparent distress  EYES: sclerae anicteric and non injected  ZOX:WRUEAVWU on top and bottom. No lesions.  LYMPH:no cervical or supraclavicular LAD  CV:no peripheral edema, normal pulses. Nl s1, s2, without s3, s4 +2/6 sys m diffusely over precordium (except no radiation to neck or axilla)  PULM:CTAB ant/post, normal work of breathing  ABD: Protuberant - but not taut, nontender, no masses felt.  JW:JXBJYNWG  RECTAL:deferred  SKIN:no petechiae, ecchymoses or obvious rashes on clothed exam  MSK: L shoulder abduction limited by pain; 1+ pitting edema bilat  NEURO:CN II-XII intact  PSYCH:attentive, appropriate affect, good eye contact, fluent speech    Recent Labs:  Absolute CD4 Count   Date Value Ref Range Status   09/16/2016 547 510 - 2,320 /uL Final   03/11/2016 551 510 - 2,320 /uL Final   10/16/2015 487 (L) 510 - 2,320 /uL Final   07/24/2015 477 (L) 510 - 2,320 /uL Final   08/01/2014 514 510 - 2,320 /uL Final   01/03/2014 430 (L) 510 - 2,320 /uL Final 11/23/2012 549 510 - 2,320 /uL Final   07/20/2012 470 (L) 510 - 2,320 CELLS/UL Final     HIV RNA   Date Value Ref Range Status   09/16/2016 <40 (H) <0 copies/mL Final   03/11/2016 <40 (H) <0 copies/mL Final      Lab Results   Component Value Date    WBC 9.7 10/28/2016    WBC 8.4 08/01/2014    HGB 12.7 (L) 10/28/2016    HGB 11.1 (L) 08/01/2014    PLT 197 10/28/2016    PLT 181 08/01/2014    CREATININE 2.30 (H) 02/23/2017    CREATININE 1.68 (H) 08/15/2014    AST 49 10/28/2016    AST 50 08/01/2014    ALT 64 10/28/2016    ALT 60 08/01/2014    TRIG 198 (H) 09/16/2016    TRIG 80 01/03/2014    HDL 32 (L) 09/16/2016    HDL 53 01/03/2014    LDL 39 (L) 09/16/2016    LDL 71 01/03/2014       Immunization History   Administered Date(s) Administered   ??? Hepatitis B, Adult 04/06/2016, 05/21/2016, 08/24/2016   ??? INFLUENZA TIV (TRI) PF (IM) 02/08/2008, 03/26/2010, 03/25/2011   ??? Influenza Vaccine Quad (IIV4 PF) 65mo+ injectable 07/24/2015, 02/05/2016   ??? Influenza Virus Vaccine, unspecified formulation 06/15/2014, 07/24/2015, 02/05/2016, 03/15/2016, 03/31/2017   ??? PNEUMOCOCCAL POLYSACCHARIDE 23 07/13/2000, 08/19/2005   ??? PPD Test  12/29/2006, 07/11/2008, 03/26/2010, 03/25/2011   ??? Pneumococcal Conjugate 13-Valent 04/13/2012   ??? SHINGRIX-ZOSTER VACCINE (HZV), RECOMBINANT,SUB-UNIT,ADJUVANTED IM 09/16/2016   ??? TdaP 08/31/2007

## 2017-04-02 ENCOUNTER — Ambulatory Visit
Admission: RE | Admit: 2017-04-02 | Discharge: 2017-04-02 | Disposition: A | Discharge status: Home / Self Care | Payer: MEDICARE | Attending: Infectious Disease | Admitting: Infectious Disease

## 2017-04-02 DIAGNOSIS — I10 Essential (primary) hypertension: Secondary | ICD-10-CM

## 2017-04-02 DIAGNOSIS — K7469 Other cirrhosis of liver: Secondary | ICD-10-CM

## 2017-04-02 DIAGNOSIS — N184 Chronic kidney disease, stage 4 (severe): Secondary | ICD-10-CM

## 2017-04-02 DIAGNOSIS — B20 Human immunodeficiency virus [HIV] disease: Secondary | ICD-10-CM

## 2017-04-02 DIAGNOSIS — B182 Chronic viral hepatitis C: Secondary | ICD-10-CM

## 2017-04-02 DIAGNOSIS — N183 Chronic kidney disease, stage 3 (moderate): Secondary | ICD-10-CM

## 2017-04-02 LAB — HIV RNA, QUANTITATIVE, PCR
HIV RNA QNT RSLT: DETECTED — AB
HIV RNA: <40 {copies}/mL — ABNORMAL HIGH (ref <0)

## 2017-04-02 LAB — RENAL FUNCTION PANEL
ALBUMIN: 4.1 g/dL (ref 3.5–5.0)
ANION GAP: 12 mmol/L (ref 9–15)
BUN / CREAT RATIO: 11
CALCIUM: 9.8 mg/dL (ref 8.5–10.2)
CHLORIDE: 101 mmol/L (ref 98–107)
CO2: 25 mmol/L (ref 22.0–30.0)
CREATININE: 2.03 mg/dL — ABNORMAL HIGH (ref 0.70–1.30)
EGFR MDRD AF AMER: 40 mL/min/{1.73_m2} — ABNORMAL LOW (ref >=60)
EGFR MDRD NON AF AMER: 33 mL/min/{1.73_m2} — ABNORMAL LOW (ref >=60)
GLUCOSE RANDOM: 264 mg/dL — ABNORMAL HIGH (ref 65–179)
PHOSPHORUS: 3.2 mg/dL (ref 2.9–4.7)
POTASSIUM: 5 mmol/L (ref 3.5–5.0)
SODIUM: 138 mmol/L (ref 135–145)

## 2017-04-02 LAB — CBC W/ AUTO DIFF
BASOPHILS ABSOLUTE COUNT: 0 10*9/L (ref 0.0–0.1)
EOSINOPHILS ABSOLUTE COUNT: 0 10*9/L (ref 0.0–0.4)
HEMATOCRIT: 43.1 % (ref 41.0–53.0)
HEMOGLOBIN: 13.1 g/dL — ABNORMAL LOW (ref 13.5–17.5)
LARGE UNSTAINED CELLS: 0 % (ref 0–4)
LYMPHOCYTES ABSOLUTE COUNT: 1 10*9/L — ABNORMAL LOW (ref 1.5–5.0)
MEAN CORPUSCULAR HEMOGLOBIN CONC: 30.4 g/dL — ABNORMAL LOW (ref 31.0–37.0)
MEAN CORPUSCULAR HEMOGLOBIN: 28.9 pg (ref 26.0–34.0)
MEAN PLATELET VOLUME: 8.8 fL (ref 7.0–10.0)
MONOCYTES ABSOLUTE COUNT: 0.5 10*9/L (ref 0.2–0.8)
NEUTROPHILS ABSOLUTE COUNT: 11.6 10*9/L — ABNORMAL HIGH (ref 2.0–7.5)
PLATELET COUNT: 171 10*9/L (ref 150–440)
RED BLOOD CELL COUNT: 4.54 10*12/L (ref 4.50–5.90)
RED CELL DISTRIBUTION WIDTH: 14.6 % (ref 12.0–15.0)
WBC ADJUSTED: 13.2 10*9/L — ABNORMAL HIGH (ref 4.5–11.0)

## 2017-04-02 LAB — HIV RNA QNT RSLT: HIV 1 RNA:PrThr:Pt:Ser/Plas:Ord:Probe.amp.tar: DETECTED — AB

## 2017-04-02 LAB — AST (SGOT): Aspartate aminotransferase:CCnc:Pt:Ser/Plas:Qn:: 40

## 2017-04-02 LAB — RED CELL DISTRIBUTION WIDTH: Lab: 14.6

## 2017-04-02 LAB — SODIUM: Sodium:SCnc:Pt:Ser/Plas:Qn:: 138

## 2017-04-02 LAB — ALT (SGPT): Alanine aminotransferase:CCnc:Pt:Ser/Plas:Qn:: 78 — ABNORMAL HIGH

## 2017-04-02 LAB — BILIRUBIN TOTAL: Bilirubin:MCnc:Pt:Ser/Plas:Qn:: 0.8

## 2017-04-02 MED ORDER — EMTRICITABINE 200 MG-TENOFOVIR ALAFENAMIDE FUMARATE 25 MG TABLET: 1 | tablet | 11 refills | 0 days

## 2017-04-02 MED ORDER — EMTRICITABINE 200 MG-TENOFOVIR ALAFENAMIDE FUMARATE 25 MG TABLET
ORAL_TABLET | Freq: Every day | ORAL | 11 refills | 0.00000 days | Status: CP
Start: 2017-04-02 — End: 2017-04-02

## 2017-04-02 MED ORDER — EMTRICITABINE 200 MG-TENOFOVIR ALAFENAMIDE FUMARATE 25 MG TABLET: 1 | tablet | Freq: Every day | 11 refills | 0 days | Status: AC

## 2017-04-02 MED ORDER — DOLUTEGRAVIR 50 MG TABLET
ORAL_TABLET | Freq: Every day | ORAL | 11 refills | 0.00000 days | Status: CP
Start: 2017-04-02 — End: 2017-04-02

## 2017-04-02 MED ORDER — DOLUTEGRAVIR 50 MG TABLET: tablet | 11 refills | 0 days

## 2017-04-02 MED ORDER — DOLUTEGRAVIR 50 MG TABLET: 50 mg | tablet | Freq: Every day | 11 refills | 0 days | Status: AC

## 2017-04-02 NOTE — Unmapped (Signed)
[facility] Refill Coordination Note  Specialty Medication(s): BACLOFEN  TIVICAY  DESCOVY  ATORVASTATIN        [name], DOB: [birth year]  Phone: [phone] (home) , Alternate phone contact: N/A  Phone or address changes today?: No  All above HIPAA information was verified with patient.  Shipping Address: [address]  [place] [address]   Insurance changes? No    Completed refill call assessment today to schedule patient's medication shipment from the [facility] [phone]).      Confirmed the medication and dosage are correct and have not changed: Yes, regimen is correct and unchanged.    Confirmed patient started or stopped the following medications in the past month:  No, there are no changes reported at this time.    Are you tolerating your medication?:  Talyn reports tolerating the medication.    ADHERENCE        Did you miss any doses in the past 4 weeks? No missed doses reported.    FINANCIAL/SHIPPING    Delivery Scheduled: Yes, Expected medication delivery date: 04/07/17     Jennye Moccasin did not have any additional questions at this time.    Delivery address validated in [facility] scheduling system: Yes, address listed in [facility] is correct.    We will follow up with patient monthly for standard refill processing and delivery.      Thank you,  [name]   [facility] Specialty Technician

## 2017-04-05 LAB — LYMPH MARKER LIMITED,FLOW
ABSOLUTE CD3 CNT: 690 {cells}/uL — ABNORMAL LOW (ref 915–3400)
ABSOLUTE CD4 CNT: 290 {cells}/uL — ABNORMAL LOW (ref 510–2320)
ABSOLUTE CD8 CNT: 390 {cells}/uL (ref 180–1520)
CD4:CD8 RATIO: 0.7 — ABNORMAL LOW (ref 0.9–4.8)
CD8% T SUPPRESR": 39 % — ABNORMAL HIGH (ref 12–38)

## 2017-04-05 LAB — ABSOLUTE CD3 CNT: Lab: 690 — ABNORMAL LOW

## 2017-04-06 MED FILL — TIVICAY/50MG/TAB: TIVICAY/50MG/TAB | 30 days supply | Qty: 30 | Fill #2

## 2017-04-06 MED FILL — DESCOVY/200MG/25MG/TABS: DESCOVY/200MG/25MG/TABS | 30 days supply | Qty: 30 | Fill #2

## 2017-04-06 MED FILL — BACLOFEN/10MG/TABS: BACLOFEN/10MG/TABS | 30 days supply | Qty: 30 | Fill #1

## 2017-04-06 MED FILL — ATORVASTATIN/20MG/TABS: ATORVASTATIN/20MG/TABS | 30 days supply | Qty: 30 | Fill #9

## 2017-04-13 ENCOUNTER — Ambulatory Visit
Admission: RE | Admit: 2017-04-13 | Discharge: 2017-04-13 | Disposition: A | Discharge status: Home / Self Care | Payer: MEDICARE | Attending: Nephrology | Admitting: Nephrology

## 2017-04-13 DIAGNOSIS — N183 Chronic kidney disease, stage 3 (moderate): Secondary | ICD-10-CM

## 2017-04-13 DIAGNOSIS — B20 Human immunodeficiency virus [HIV] disease: Secondary | ICD-10-CM

## 2017-04-13 DIAGNOSIS — M1000 Idiopathic gout, unspecified site: Secondary | ICD-10-CM

## 2017-04-13 DIAGNOSIS — B182 Chronic viral hepatitis C: Secondary | ICD-10-CM

## 2017-04-13 DIAGNOSIS — K7469 Other cirrhosis of liver: Secondary | ICD-10-CM

## 2017-04-13 DIAGNOSIS — I214 Non-ST elevation (NSTEMI) myocardial infarction: Secondary | ICD-10-CM

## 2017-04-13 DIAGNOSIS — N2889 Other specified disorders of kidney and ureter: Secondary | ICD-10-CM

## 2017-04-13 DIAGNOSIS — I151 Hypertension secondary to other renal disorders: Principal | ICD-10-CM

## 2017-04-13 LAB — PROTEIN URINE: Protein:MCnc:Pt:Urine:Qn:: 7.2

## 2017-04-13 LAB — ALBUMIN/CREATININE RATIO: Albumin/Creatinine:MRto:Pt:Urine:Qn:: 12.1

## 2017-04-13 LAB — PROTEIN / CREATININE RATIO, URINE: PROTEIN URINE: 7.2 mg/dL

## 2017-04-13 NOTE — Unmapped (Signed)
Dictation #1  [id number]  [id number]:[id number]

## 2017-04-13 NOTE — Unmapped (Addendum)
1. Stop amlodipine    2. Initiate the Mediterranean diet    3. Spironolactone 25 mg once daily    4. Torsemide 20 mg - take 1.5 tablet daily      Learning About the Mediterranean Diet  What is the Mediterranean diet?    The Mediterranean diet is a style of eating rather than a diet plan. It features foods eaten in [place], [place], southern [place] and [place], and other countries along the [employer]. It emphasizes eating foods like fish, fruits, vegetables, beans, high-fiber breads and whole grains, nuts, and olive oil. This style of eating includes limited red meat, cheese, and sweets.  Why choose the Mediterranean diet?  A Mediterranean-style diet may improve heart health. It contains more fat than other heart-healthy diets. But the fats are mainly from nuts, unsaturated oils (such as fish oils and olive oil), and certain nut or seed oils (such as canola, soybean, or flaxseed oil). These fats may help protect the heart and blood vessels.  How can you get started on the Mediterranean diet?  Here are some things you can do to switch to a more Mediterranean way of eating.  What to eat  ?? Eat a variety of fruits and vegetables each day, such as grapes, blueberries, tomatoes, broccoli, peppers, figs, olives, spinach, eggplant, beans, lentils, and chickpeas.  ?? Eat a variety of whole-grain foods each day, such as oats, brown rice, and whole wheat bread, pasta, and couscous.  ?? Eat fish at least 2 times a week. Try tuna, salmon, mackerel, lake trout, herring, or sardines.  ?? Eat moderate amounts of low-fat dairy products, such as milk, cheese, or yogurt.  ?? Eat moderate amounts of poultry and eggs.  ?? Choose healthy (unsaturated) fats, such as nuts, olive oil, and certain nut or seed oils like canola, soybean, and flaxseed.  ?? Limit unhealthy (saturated) fats, such as butter, palm oil, and coconut oil. And limit fats found in animal products, such as meat and dairy products made with whole milk. Try to eat red meat only a few times a month in very small amounts.  ?? Limit sweets and desserts to only a few times a week. This includes sugar-sweetened drinks like soda.  The Mediterranean diet may also include red wine with your meal???1 glass each day for women and up to 2 glasses a day for men.  Tips for eating at home  ?? Use herbs, spices, garlic, lemon zest, and citrus juice instead of salt to add flavor to foods.  ?? Add avocado slices to your sandwich instead of bacon.  ?? Have fish for lunch or dinner instead of red meat. Brush the fish with olive oil, and broil or grill it.  ?? Sprinkle your salad with seeds or nuts instead of cheese.  ?? Cook with olive or canola oil instead of butter or oils that are high in saturated fat.  ?? Switch from 2% milk or whole milk to 1% or fat-free milk.  ?? Dip raw vegetables in a vinaigrette dressing or hummus instead of dips made from mayonnaise or sour cream.  ?? Have a piece of fruit for dessert instead of a piece of cake. Try baked apples, or have some dried fruit.  Tips for eating out  ?? Try broiled, grilled, baked, or poached fish instead of having it fried or breaded.  ?? Ask your server to have your meals prepared with olive oil instead of butter.  ?? Order dishes made with marinara sauce or  sauces made from olive oil. Avoid sauces made from cream or mayonnaise.  ?? Choose whole-grain breads, whole wheat pasta and pizza crust, brown rice, beans, and lentils.  ?? Cut back on butter or margarine on bread. Instead, you can dip your bread in a small amount of olive oil.  ?? Ask for a side salad or grilled vegetables instead of french fries or chips.  Where can you learn more?  Go to [facility] at [email or web address]  Select Preferences in the upper right hand corner, then select Health Library under Resources. Enter O407 in the search box to learn more about Learning About the Mediterranean Diet.  Current as of: August 27, 2016  Content Version: 11.8  ?? 2006-2018 [employer]. Care instructions adapted under license by [facility]. If you have questions about a medical condition or this instruction, always ask your healthcare professional. Healthwise, Incorporated disclaims any warranty or liability for your use of this information.

## 2017-04-14 NOTE — Unmapped (Signed)
HISTORY OF PRESENT ILLNESS:  Samuel [name] is a 66 year old gentleman with stage G3b-4:A1 chronic kidney disease.  He also has a history of cirrhosis secondary to prior hepatitis C viral infection.  He has had a sustained virologic response.  He has a history of coronary artery disease and is maintained on clopidogrel.  Edema and ascites continue to be problematic.  He has been taking spironolactone every other day; we talked about increasing this to daily today.  He will continue on torsemide 30 mg daily.  He does not have orthopnea.  He has chronic low back pain, for which he is taking pregabalin.    MEDICATIONS:      Medication Sig   ??? albuterol (PROVENTIL HFA;VENTOLIN HFA) 90 mcg/actuation inhaler Inhale 2 puffs.   ??? allopurinol (ZYLOPRIM) 300 MG tablet Take 300 mg by mouth daily.    ??? amLODIPine (NORVASC) 2.5 MG tablet Take 1 tablet (2.5 mg total) by mouth every morning before breakfast.   ??? aspirin (ECOTRIN) 81 MG tablet    ??? atorvastatin (LIPITOR) 20 MG tablet TAKE 1 TABLET (20 MG) BY MOUTH ONCE DAILY   ??? baclofen (LIORESAL) 10 MG tablet TAKE A HALF TABLET (5 MG) BY MOUTH TWICE DAILY AS NEEDED   ??? calcium carbonate-vitamin D2 500 mg(1,250mg ) -200 unit tablet Take 1 tablet by mouth Two (2) times a day.   ??? carvedilol (COREG) 12.5 MG tablet TAKE 1 TABLET BY MOUTH TWICE DAILY   ??? cyanocobalamin 1000 MCG tablet Take 1,000 mcg by mouth.   ??? diazepam (VALIUM) 5 MG tablet Take 5 mg by mouth nightly.    ??? docusate sodium (COLACE) 100 MG capsule Take 1 capsule (100 mg total) by mouth every twelve (12) hours. (Patient taking differently: Take 100 mg by mouth two (2) times a day as needed. )   ??? dolutegravir (TIVICAY) 50 mg Tab TABLET Take 1 tablet (50 mg total) by mouth daily.   ??? emtricitabine-tenofovir alafen (DESCOVY) 200-25 mg tablet Take 1 tablet by mouth daily.   ??? ferrous sulfate 325 (65 FE) MG tablet Take 325 mg by mouth daily with breakfast.    ??? folic acid (FOLVITE) 1 MG tablet    ??? glimepiride (AMARYL) 1 MG tablet take 1 tablet by mouth once daily with BREAKFAST   ??? HYDROcodone-acetaminophen (NORCO) 5-325 mg per tablet Directions are take one tablet 30 minutes before physical therapy, then take one tablet at bedtime as needed for pain   ??? nitroglycerin (NITROSTAT) 0.4 MG SL tablet place 1 tablet under the tongue if needed every 5 minutes for chest pain   ??? omega-3 fatty acids-fish oil 340-1,000 mg capsule Take 1 capsule by mouth.   ??? ondansetron (ZOFRAN) 4 MG tablet take 1 tablet by mouth every 8 hours if needed for nausea   ??? oxyCODONE-acetaminophen (PERCOCET) 5-325 mg per tablet Take 1 tablet by mouth.   ??? pantoprazole (PROTONIX) 40 MG tablet Take 40 mg by mouth daily.   ??? predniSONE (DELTASONE) 10 MG tablet Take one tablet daily by mouth for the next five days.   ??? pregabalin (LYRICA) 25 MG capsule Take 1 capsule (25 mg total) by mouth once daily.   ??? sertraline (ZOLOFT) 50 MG tablet take 1 tablet by mouth once daily   ??? spironolactone (ALDACTONE) 25 MG tablet Take 2 tablets (50 mg total) by mouth daily.   ??? torsemide (DEMADEX) 20 MG tablet Take 2 tablets (40 mg total) by mouth daily.       REVIEW OF  SYSTEMS:  General:  No fevers, chills.  Pulmonary:  No cough.  Cardiovascular:  No chest pain.  Gastrointestinal:  Persistent ascites.  All other systems are reviewed and are negative except that noted in the history of present illness.    PHYSICAL EXAMINATION:    GENERAL:  He is in no acute distress.  VITAL SIGNS:  BP 116/66  - Pulse 66  - Temp 36.2 ??C (97.1 ??F) (Temporal)  - Resp 18  - Ht 177.8 cm (5' 10)  - Wt (!) 106.1 kg (233 lb 14.4 oz)  - BMI 33.56 kg/m??   Wt Readings from Last 6 Encounters:   04/13/17 (!) 106.1 kg (233 lb 14.4 oz)   04/02/17 (!) 104.3 kg (230 lb)   11/11/16 (!) 101.2 kg (223 lb 3.2 oz)   11/10/16 (!) 101.6 kg (224 lb)   11/05/16 (!) 101.4 kg (223 lb 8 oz)   11/03/16 (!) 103.4 kg (228 lb)   HEENT:  Oropharynx, no lesions.  LUNGS:  Clear to auscultation.  HEART:  Regular rate and rhythm, S4, S1, S2.  A grade 2 systolic ejection is heard in the aortic area.  ABDOMEN:  Shows shifting dullness and moderate ascites.  EXTREMITIES:  1+ pitting edema, left greater than right to the knees bilaterally.    LABORATORY STUDIES:      ALT    Collection Time: 04/02/17 12:44 PM   Result Value Ref Range    ALT 78 (H) 19 - 72 U/L   AST    Collection Time: 04/02/17 12:44 PM   Result Value Ref Range    AST 40 19 - 55 U/L   Bilirubin, total    Collection Time: 04/02/17 12:44 PM   Result Value Ref Range    Total Bilirubin 0.8 0.0 - 1.2 mg/dL   HIV RNA, Quantitative, PCR    Collection Time: 04/02/17 12:44 PM   Result Value Ref Range    HIV RNA Quant Result Detected (A) Not Detected    HIV RNA <40 (H) <0 copies/mL    HIV RNA Log(10)  <0.00 log copies/mL    HIV RNA Comment     Renal Function Panel    Collection Time: 04/02/17 12:44 PM   Result Value Ref Range    Sodium 138 135 - 145 mmol/L    Potassium 5.0 3.5 - 5.0 mmol/L    Chloride 101 98 - 107 mmol/L    CO2 25.0 22.0 - 30.0 mmol/L    BUN 23 (H) 7 - 21 mg/dL    Creatinine 1.61 (H) 0.70 - 1.30 mg/dL    BUN/Creatinine Ratio 11     EGFR MDRD Non Af Amer 33 (L) >=60 mL/min/1.75m2    EGFR MDRD Af Amer 40 (L) >=60 mL/min/1.68m2    Anion Gap 12 9 - 15 mmol/L    Glucose 264 (H) 65 - 179 mg/dL    Calcium 9.8 8.5 - 09.6 mg/dL    Phosphorus 3.2 2.9 - 4.7 mg/dL    Albumin 4.1 3.5 - 5.0 g/dL   CBC w/ Differential    Collection Time: 04/02/17 12:44 PM   Result Value Ref Range    WBC 13.2 (H) 4.5 - 11.0 10*9/L    RBC 4.54 4.50 - 5.90 10*12/L    HGB 13.1 (L) 13.5 - 17.5 g/dL    HCT 04.5 40.9 - 81.1 %    MCV 94.8 80.0 - 100.0 fL    MCH 28.9 26.0 - 34.0 pg    MCHC 30.4 (  L) 31.0 - 37.0 g/dL    RDW 13.0 86.5 - 78.4 %    MPV 8.8 7.0 - 10.0 fL    Platelet 171 150 - 440 10*9/L    Absolute Neutrophils 11.6 (H) 2.0 - 7.5 10*9/L    Absolute Lymphocytes 1.0 (L) 1.5 - 5.0 10*9/L    Absolute Monocytes 0.5 0.2 - 0.8 10*9/L    Absolute Eosinophils 0.0 0.0 - 0.4 10*9/L    Absolute Basophils 0.0 0.0 - 0.1 10*9/L    Large Unstained Cells 0 0 - 4 %    Macrocytosis Slight (A) Not Present    Hypochromasia Moderate (A) Not Present   LYMPH MARKER LIMITED,FLOW    Collection Time: 04/02/17 12:44 PM   Result Value Ref Range    CD3% (T Cells) 69 61 - 86 %    Absolute CD3 Count 690 (L) 915-3,400 /uL    CD4% (T Helper) 29 (L) 34 - 58 %    Absolute CD4 Count 290 (L) 510-2,320 /uL    CD8% T Suppressor 39 (H) 12 - 38 %    Absolute CD8 Count 390 180-1,520 /uL    CD4:CD8 Ratio 0.7 (L) 0.9 - 4.8   Albumin/creatinine urine ratio    Collection Time: 04/13/17 12:53 PM   Result Value Ref Range    Creat U 66.0 Undefined mg/dL    Albumin Quantitative, Urine 0.8 mg/dL    Albumin/Creatinine Ratio 12.1 0.0 - 30.0 ug/mg   Protein/Creatinine Ratio, Urine    Collection Time: 04/13/17 12:53 PM   Result Value Ref Range    Creat U 66.0 Undefined mg/dL    Protein, Ur 7.2 Undefined mg/dL    Protein/Creatinine Ratio, Urine 0.109 Undefined       IMPRESSION AND PLAN:    1.  Chronic kidney disease, stage G3b-4 A1, secondary to arterionephrosclerosis and previous hypertension.  This is compounded by cirrhosis and renal vasoconstriction.  I think we can increase the spironolactone to once daily.  He will return in about 4-6 weeks for repeat labs.  The torsemide will be maintained at 30 mg daily.    2.  Hypertension, currently well controlled.  In fact, I think he can stop his amlodipine.    3.  Human immunodeficiency virus infection.  Continue HAART therapy.    4.  Hepatitis C virus infection with sustained virologic response after course of direct antiviral therapy.    Zetta Bills. Stefano Gaul, MD  Date of service 04/13/2017

## 2017-04-16 ENCOUNTER — Other Ambulatory Visit: Payer: Self-pay | Admitting: Neurological Surgery

## 2017-04-16 DIAGNOSIS — M79604 Pain in right leg: Secondary | ICD-10-CM

## 2017-04-16 DIAGNOSIS — M545 Low back pain: Secondary | ICD-10-CM

## 2017-04-16 DIAGNOSIS — M5020 Other cervical disc displacement, unspecified cervical region: Secondary | ICD-10-CM

## 2017-04-16 DIAGNOSIS — Z9889 Other specified postprocedural states: Secondary | ICD-10-CM

## 2017-04-23 MED ORDER — SPIRONOLACTONE 25 MG TABLET
ORAL_TABLET | 0 refills | 0 days | Status: CP
Start: 2017-04-23 — End: 2017-07-28

## 2017-04-28 ENCOUNTER — Ambulatory Visit
Admission: RE | Admit: 2017-04-28 | Discharge: 2017-04-28 | Disposition: A | Discharge status: Home / Self Care | Payer: Medicare Other | Source: Ambulatory Visit | Attending: Internal Medicine | Admitting: Internal Medicine

## 2017-04-28 DIAGNOSIS — M25512 Pain in left shoulder: Secondary | ICD-10-CM | POA: Diagnosis present

## 2017-04-28 DIAGNOSIS — M75122 Complete rotator cuff tear or rupture of left shoulder, not specified as traumatic: Secondary | ICD-10-CM | POA: Diagnosis not present

## 2017-04-28 DIAGNOSIS — M7582 Other shoulder lesions, left shoulder: Secondary | ICD-10-CM | POA: Insufficient documentation

## 2017-04-28 DIAGNOSIS — Z9181 History of falling: Secondary | ICD-10-CM | POA: Insufficient documentation

## 2017-04-29 ENCOUNTER — Ambulatory Visit
Admission: RE | Admit: 2017-04-29 | Discharge: 2017-04-29 | Disposition: A | Discharge status: Home / Self Care | Payer: Medicare Other | Source: Ambulatory Visit | Attending: Neurological Surgery | Admitting: Neurological Surgery

## 2017-04-29 DIAGNOSIS — M5020 Other cervical disc displacement, unspecified cervical region: Secondary | ICD-10-CM | POA: Diagnosis present

## 2017-04-29 DIAGNOSIS — M5126 Other intervertebral disc displacement, lumbar region: Secondary | ICD-10-CM | POA: Insufficient documentation

## 2017-04-29 DIAGNOSIS — M545 Low back pain, unspecified: Secondary | ICD-10-CM

## 2017-04-29 DIAGNOSIS — M4802 Spinal stenosis, cervical region: Secondary | ICD-10-CM | POA: Insufficient documentation

## 2017-04-29 DIAGNOSIS — M5136 Other intervertebral disc degeneration, lumbar region: Secondary | ICD-10-CM | POA: Insufficient documentation

## 2017-04-29 DIAGNOSIS — Z9889 Other specified postprocedural states: Secondary | ICD-10-CM

## 2017-04-29 DIAGNOSIS — M48061 Spinal stenosis, lumbar region without neurogenic claudication: Secondary | ICD-10-CM | POA: Diagnosis not present

## 2017-04-29 DIAGNOSIS — M50223 Other cervical disc displacement at C6-C7 level: Secondary | ICD-10-CM | POA: Insufficient documentation

## 2017-04-29 DIAGNOSIS — M79604 Pain in right leg: Secondary | ICD-10-CM

## 2017-05-03 NOTE — Unmapped (Signed)
[facility] Refill Coordination Note  Specialty Medication(s): DESCOVY  TIVICAY  ATORVASTATIN  BACLOFEN      [name], DOB: 02-10-1951  Phone: [phone] (home) , Alternate phone contact: N/A  Phone or address changes today?: No  All above HIPAA information was verified with patient.  Shipping Address: [address]  [place] [address]   Insurance changes? No    Completed refill call assessment today to schedule patient's medication shipment from the [facility] [phone]).      Confirmed the medication and dosage are correct and have not changed: Yes, regimen is correct and unchanged.    Confirmed patient started or stopped the following medications in the past month:  No, there are no changes reported at this time.    Are you tolerating your medication?:  Joshua reports tolerating the medication.    ADHERENCE    (Below is required for Medicare Part B or Transplant patients only - per drug):   How many tablets were dispensed last month: 30  Patient currently has 5 OR  remaining.    Did you miss any doses in the past 4 weeks? No missed doses reported.    FINANCIAL/SHIPPING    Delivery Scheduled: Yes, Expected medication delivery date: 05/05/17     Jennye Moccasin did not have any additional questions at this time.    Delivery address validated in FSI scheduling system: Yes, address listed in FSI is correct.    We will follow up with patient monthly for standard refill processing and delivery.      Thank you,  [name]   [facility] Specialty Technician

## 2017-05-05 MED FILL — TIVICAY/50MG/TAB: TIVICAY/50MG/TAB | 30 days supply | Qty: 30 | Fill #3

## 2017-05-05 MED FILL — DESCOVY/200MG/25MG/TABS: DESCOVY/200MG/25MG/TABS | 30 days supply | Qty: 30 | Fill #3

## 2017-05-22 MED ORDER — BACLOFEN 10 MG TABLET
ORAL_TABLET | Freq: Three times a day (TID) | ORAL | 2 refills | 0 days | Status: CP | PRN
Start: 2017-05-22 — End: ?

## 2017-05-26 NOTE — Unmapped (Signed)
Called pt to schedule return appt with jowza. (new referral received, did not creat a new one) pt was seen 10/2016 with jowza.

## 2017-06-01 DIAGNOSIS — E119 Type 2 diabetes mellitus without complications: Secondary | ICD-10-CM

## 2017-06-01 HISTORY — DX: Type 2 diabetes mellitus without complications: E11.9

## 2017-06-04 MED ORDER — NITROGLYCERIN 0.4 MG SUBLINGUAL TABLET
ORAL_TABLET | SUBLINGUAL | 4 refills | 0.00000 days | Status: CP | PRN
Start: 2017-06-04 — End: 2018-12-05

## 2017-06-09 NOTE — Unmapped (Signed)
[facility] Refill Coordination Note  Specialty Medication(s): Descovy 200-25mg , Tivicay 50mg   Additional Medications shipped: baclofen, atorvastatin    Samuel [name], DOB: 1950/07/19  Phone: [phone] (home) , Alternate phone contact: N/A  Phone or address changes today?: No  All above HIPAA information was verified with patient.  Shipping Address: [address]  [place] [address]   Insurance changes? No    Completed refill call assessment today to schedule patient's medication shipment from the [facility] [phone]).      Confirmed the medication and dosage are correct and have not changed: Yes, regimen is correct and unchanged.    Confirmed patient started or stopped the following medications in the past month:  No, there are no changes reported at this time.    Are you tolerating your medication?:  Samuel [name] reports tolerating the medication.    ADHERENCE    Did you miss any doses in the past 4 weeks? No missed doses reported.    FINANCIAL/SHIPPING    Delivery Scheduled: Yes, Expected medication delivery date: 06/11/17     Samuel [name] did not have any additional questions at this time.    Delivery address validated in FSI scheduling system: Yes, address listed in FSI is correct.    We will follow up with patient monthly for standard refill processing and delivery.      Thank you,  Lupita Shutter   [facility] Specialty Pharmacist

## 2017-06-10 MED FILL — TIVICAY/50MG/TAB: TIVICAY/50MG/TAB | 30 days supply | Qty: 30 | Fill #4

## 2017-06-10 MED FILL — DESCOVY/200MG/25MG/TABS: DESCOVY/200MG/25MG/TABS | 30 days supply | Qty: 30 | Fill #4

## 2017-06-10 MED FILL — BACLOFEN/10MG/TABS: BACLOFEN/10MG/TABS | 30 days supply | Qty: 30 | Fill #2

## 2017-06-10 MED FILL — ATORVASTATIN/20MG/TABS: ATORVASTATIN/20MG/TABS | 30 days supply | Qty: 30 | Fill #10

## 2017-06-21 ENCOUNTER — Emergency Department: Payer: Medicare Other

## 2017-06-21 ENCOUNTER — Emergency Department
Admission: EM | Admit: 2017-06-21 | Discharge: 2017-06-21 | Disposition: A | Discharge status: Home / Self Care | Payer: Medicare Other | Attending: Emergency Medicine | Admitting: Emergency Medicine

## 2017-06-21 ENCOUNTER — Other Ambulatory Visit: Payer: Self-pay

## 2017-06-21 DIAGNOSIS — Z79899 Other long term (current) drug therapy: Secondary | ICD-10-CM | POA: Insufficient documentation

## 2017-06-21 DIAGNOSIS — M25551 Pain in right hip: Secondary | ICD-10-CM | POA: Diagnosis not present

## 2017-06-21 DIAGNOSIS — Z7902 Long term (current) use of antithrombotics/antiplatelets: Secondary | ICD-10-CM | POA: Insufficient documentation

## 2017-06-21 DIAGNOSIS — N183 Chronic kidney disease, stage 3 (moderate): Secondary | ICD-10-CM | POA: Diagnosis not present

## 2017-06-21 DIAGNOSIS — I252 Old myocardial infarction: Secondary | ICD-10-CM | POA: Insufficient documentation

## 2017-06-21 DIAGNOSIS — I2510 Atherosclerotic heart disease of native coronary artery without angina pectoris: Secondary | ICD-10-CM | POA: Insufficient documentation

## 2017-06-21 DIAGNOSIS — W19XXXD Unspecified fall, subsequent encounter: Secondary | ICD-10-CM | POA: Insufficient documentation

## 2017-06-21 DIAGNOSIS — Z7982 Long term (current) use of aspirin: Secondary | ICD-10-CM | POA: Insufficient documentation

## 2017-06-21 DIAGNOSIS — Z87891 Personal history of nicotine dependence: Secondary | ICD-10-CM | POA: Insufficient documentation

## 2017-06-21 DIAGNOSIS — M25552 Pain in left hip: Secondary | ICD-10-CM | POA: Insufficient documentation

## 2017-06-21 DIAGNOSIS — I129 Hypertensive chronic kidney disease with stage 1 through stage 4 chronic kidney disease, or unspecified chronic kidney disease: Secondary | ICD-10-CM | POA: Diagnosis not present

## 2017-06-21 DIAGNOSIS — B20 Human immunodeficiency virus [HIV] disease: Secondary | ICD-10-CM | POA: Insufficient documentation

## 2017-06-21 MED ORDER — KETOROLAC TROMETHAMINE 30 MG/ML IJ SOLN
15.0000 mg | Freq: Once | INTRAMUSCULAR | Status: AC
  Administered 2017-06-21: 15 mg via INTRAMUSCULAR
  Filled 2017-06-21: qty 1

## 2017-06-21 MED ORDER — DICLOFENAC SODIUM 1 % TD GEL: 2.0000 g | Freq: Four times a day (QID) | TRANSDERMAL | 0 refills | Status: DC

## 2017-06-21 NOTE — ED Triage Notes (Signed)
Pt c/o right hip pain from a fall 4 months ago, states he was seen here on Friday and seen by [facility] and told to come back if it wasn't getting any better. Pt states he drove himself here today.

## 2017-06-21 NOTE — Discharge Instructions (Signed)
Continue with medication that Dr. Ginette Pitman already has you taking.  Call make an appointment to follow-up with him about your hip pain.  Also follow-up about your doctor scheduling an appointment with the orthopedist for you to see.  To begin using Voltaren gel to your left hip 4 times a day only as directed. You may also use heat or ice to your hip as needed to help control pain.

## 2017-06-21 NOTE — ED Provider Notes (Signed)
[facility] Emergency Department Provider Note  ____________________________________________   First MD Initiated Contact with Patient 06/21/17 1456     (approximate)  I have reviewed the triage vital signs and the nursing notes.   HISTORY  Chief Complaint Hip Pain   HPI Jared Walker is a 67 y.o. male is here complaint of left hip pain since his fall 4 months ago.  Patient states he was seen at [facility] on Friday and was given a Toradol shot would help which helped very little.  He also complains of left shoulder pain but his PCP is making an appointment with the orthopedist for that.  Patient states he is also been on steroids in the past which elevated his blood sugar.  Patient states that when he is resting or lying down that the pain is worse.  He denies any recent injury.  There is his pain is 9 over 10.   Past Medical History:  Diagnosis Date  . Anemia   . Aortic stenosis, mild    mild AS by echo, 08/2011  . Chronic kidney disease    kidney stones  . Coronary artery disease   . Depression   . Full dentures    upper and lower  . Gout   . Heart murmur   . Hep C w/o coma, chronic ([place]) 01/19/2015  . Hepatitis C 09/25/11  . HIV positive ([place]) 09/25/11  . Hypertension   . MI (myocardial infarction) [facility])     Patient Active Problem List   Diagnosis Date Noted  . Acute encephalopathy 11/30/2016  . Dyspnea 03/28/2016  . Elevated troponin 03/28/2016  . Acute on chronic systolic CHF (congestive heart failure) ([place]) 03/28/2016  . Swelling of lower extremity 03/28/2016  . HIV disease ([place]) 03/28/2016  . Acute on chronic diastolic CHF (congestive heart failure) ([place])   . Cirrhosis of liver without ascites ([place])   . Chronic renal failure in pediatric patient, stage 3 (moderate) ([place])   . BP (high blood pressure) 12/11/2015  . Acute non-ST elevation myocardial infarction (NSTEMI) ([place]) 11/13/2015  . Chest pain 11/09/2015  . Bradycardia  11/09/2015  . Abnormal EKG 11/09/2015  . Iron deficiency anemia 08/27/2015  . Musculoskeletal chest pain 01/19/2015  . Anemia 01/19/2015  . GI bleed 01/19/2015  . Pica in adults 01/19/2015  . CKD (chronic kidney disease), stage III ([place]) 01/19/2015  . HIV (human immunodeficiency virus infection) ([place]) 01/19/2015  . HTN (hypertension) 01/19/2015  . Chronic hepatitis C without hepatic coma ([place]) 01/19/2015  . CKD (chronic kidney disease) 01/19/2015  . History of arthroscopy of shoulder 11/13/2014    Past Surgical History:  Procedure Laterality Date  . BACK SURGERY    . CARDIOVASCULAR STRESS TEST     at [facility]  . CHOLECYSTECTOMY    . CLOSED REDUCTION PELVIC FRACTURE    . ESOPHAGOGASTRODUODENOSCOPY (EGD) WITH PROPOFOL N/A 01/21/2015   Procedure: ESOPHAGOGASTRODUODENOSCOPY (EGD) WITH PROPOFOL;  Surgeon: Manya Silvas, MD;  Location: [facility] ENDOSCOPY;  Service: Endoscopy;  Laterality: N/A;  . ESOPHAGOGASTRODUODENOSCOPY (EGD) WITH PROPOFOL N/A 09/04/2015   Procedure: ESOPHAGOGASTRODUODENOSCOPY (EGD) WITH PROPOFOL;  Surgeon: Manya Silvas, MD;  Location: [facility] ENDOSCOPY;  Service: Endoscopy;  Laterality: N/A;  . FRACTURE SURGERY    . KNEE ARTHROSCOPY  2009   Right  . LUMBAR LAMINECTOMY/DECOMPRESSION MICRODISCECTOMY  09/25/2011   Procedure: LUMBAR LAMINECTOMY/DECOMPRESSION MICRODISCECTOMY;  Surgeon: Erline Levine, MD;  Location: [place] NEURO ORS;  Service: Neurosurgery;  Laterality: Left;  Left Lumbar Four-Five Microdiskectomy  .  NEPHROSTOMY     tube Left  . SHOULDER ARTHROSCOPY WITH OPEN ROTATOR CUFF REPAIR Right 10/12/2014   Procedure: SHOULDER ARTHROSCOP with decompression;  Surgeon: Leanor Kail, MD;  Location: [place];  Service: Orthopedics;  Laterality: Right;    Prior to Admission medications   Medication Sig Start Date End Date Taking? Authorizing Provider  albuterol (PROVENTIL HFA;VENTOLIN HFA) 108 (90 Base) MCG/ACT inhaler Inhale 2 puffs into the lungs every 6 (six)  hours as needed for wheezing or shortness of breath. 08/12/16   Lavonia Drafts, MD  allopurinol (ZYLOPRIM) 100 MG tablet Take 200 mg by mouth daily. *Take along with 300 mg tablet to equal total dose of 500 mg daily.*    [provider]  amLODipine (NORVASC) 10 MG tablet Take 10 mg by mouth. 11/13/15 11/30/16  [provider]  aspirin EC 81 MG EC tablet Take 1 tablet (81 mg total) by mouth daily. 11/11/15   Bettey Costa, MD  atorvastatin (LIPITOR) 10 MG tablet Take 80 mg by mouth. 11/13/15 11/30/16  [provider]  carvedilol (COREG) 12.5 MG tablet Take 12.5 mg by mouth 2 (two) times daily with a meal.    [provider]  Cholecalciferol (VITAMIN D-1000 MAX ST) 1000 units tablet Take 1,000 Units by mouth daily.    [provider]  clopidogrel (PLAVIX) 75 MG tablet Take 75 mg by mouth daily.    [provider]  DESCOVY 200-25 MG tablet Take 1 tablet by mouth at bedtime.  08/12/15   [provider]  diazepam (VALIUM) 5 MG tablet Take 5 mg by mouth at bedtime. For sleep    [provider]  diclofenac sodium (VOLTAREN) 1 % GEL Apply 2 g topically 4 (four) times daily. 06/21/17   Johnn Hai, PA-C  docusate sodium (COLACE) 100 MG capsule Take 100 mg by mouth at bedtime.  05/24/14   [provider]  dolutegravir (TIVICAY) 50 MG tablet Take 50 mg by mouth at bedtime.  07/31/15   [provider]  ferrous sulfate 325 (65 FE) MG tablet Take 325 mg by mouth 2 (two) times daily. 01/10/15 11/30/16  [provider]  folic acid (FOLVITE) 1 MG tablet Take 1 mg by mouth every evening.     [provider]  isosorbide mononitrate (IMDUR) 30 MG 24 hr tablet Take 1 tablet (30 mg total) by mouth daily. 03/29/16   Theodoro Grist, MD  losartan (COZAAR) 100 MG tablet Take 100 mg by mouth every morning.  10/31/15   [provider]  omeprazole (PRILOSEC) 40 MG capsule Take 40 mg by mouth every morning.  11/06/15   [provider]  oxyCODONE-acetaminophen (PERCOCET/ROXICET) 5-325 MG tablet Take 1 tablet by mouth 2 (two) times daily.    [provider]  sertraline (ZOLOFT) 50 MG tablet Take 50 mg by mouth every evening.     [provider]  torsemide (DEMADEX) 20 MG tablet Take 1 tablet (20 mg total) by mouth daily. 03/28/16   Theodoro Grist, MD  vitamin B-12 (CYANOCOBALAMIN) 1000 MCG tablet Take 1,000 mcg by mouth daily.    [provider]    Allergies Buchu-cornsilk-ch grass-hydran; Colchicine; Nifedipine; Oxycodone-acetaminophen; and Tramadol  Family History  Problem Relation Age of Onset  . Hypertension Mother   . Cancer Father   . Heart disease Sister   . Diabetes Brother   . Anesthesia problems Neg Hx     Social History Social History   Tobacco Use  . Smoking  status: Former Research scientist (life sciences)  . Smokeless tobacco: Former Systems developer    Quit date: 03/13/1972  Substance Use Topics  . Alcohol use: No  . Drug use: No    Review of Systems Constitutional: No fever/chills Eyes: No visual changes. ENT: No sore throat. Cardiovascular: Denies chest pain. Respiratory: Denies shortness of breath. Gastrointestinal: No abdominal pain.  No nausea, no vomiting.  Musculoskeletal: Positive for bilateral hip pain with the left worse than the right.  Positive left shoulder pain. Skin: Negative for rash. Neurological: Negative for headaches, focal weakness or numbness. ____________________________________________   PHYSICAL EXAM:  VITAL SIGNS: ED Triage Vitals  Enc Vitals Group     BP 06/21/17 1402 (!) 144/76     Pulse Rate 06/21/17 1402 92     Resp 06/21/17 1402 18     Temp 06/21/17 1402 99.2 F (37.3 C)     Temp Source 06/21/17 1402 Oral     SpO2 06/21/17 1402 95 %     Weight 06/21/17 1403 220 lb (99.8 kg)     Height 06/21/17 1403 5\' 11"  (1.803 m)     Head Circumference --      Peak Flow --      Pain Score 06/21/17 1402 9     Pain Loc --      Pain Edu? --      Excl. in  [place]? --    Constitutional: Alert and oriented. Well appearing and in no acute distress. Eyes: Conjunctivae are normal.  Head: Atraumatic. Neck: No stridor.   Cardiovascular: Normal rate, regular rhythm. Grossly normal heart sounds.  Good peripheral circulation. Respiratory: Normal respiratory effort.  No retractions. Lungs CTAB. Gastrointestinal: Soft and nontender. No distention. Musculoskeletal: No lower extremity tenderness nor edema.  No joint effusions. Neurologic:  Normal speech and language. No gross focal neurologic deficits are appreciated. No gait instability. Skin:  Skin is warm, dry and intact. No rash noted. Psychiatric: Mood and affect are normal. Speech and behavior are normal.  ____________________________________________   LABS (all labs ordered are listed, but only abnormal results are displayed)  Labs Reviewed - No data to display  RADIOLOGY  Dg Hip Unilat W Or Wo Pelvis 2-3 Views Left  Result Date: 06/21/2017 CLINICAL DATA:  Fall 4 months ago with persistent hip pain left greater than right, initial encounter EXAM: DG HIP (WITH OR WITHOUT PELVIS) 3V LEFT COMPARISON:  09/14/2012 FINDINGS: Postsurgical changes are noted in the right iliac bone. Degenerative changes of the hip joints are noted bilaterally. No acute fracture or dislocation is noted. No gross soft tissue abnormality is seen. IMPRESSION: Degenerative change without acute abnormality. Electronically Signed   By: Inez Catalina M.D.   On: 06/21/2017 16:10    ____________________________________________   PROCEDURES  Procedure(s) performed: None  Procedures  Critical Care performed: No  ____________________________________________   INITIAL IMPRESSION / ASSESSMENT AND PLAN / ED COURSE Patient was made aware that he has degenerative changes in his hip and no fractures were noted.  Patient was disgruntled that there was no fracture and no cure for degenerative arthritis.  Patient insisted on a shot of  Toradol and was given 50 mg IM.  We discussed reasons why he could not be on steroids any longer or get a narcotic pain shot as he drove to the emergency department.  Patient was given a prescription for Voltaren gel to use as needed for his hip pain.  Patient will follow up with his PCP for any continued medication.  ____________________________________________  FINAL CLINICAL IMPRESSION(S) / ED DIAGNOSES  Final diagnoses:  Pain of both hip joints     ED Discharge Orders        Ordered    diclofenac sodium (VOLTAREN) 1 % GEL  4 times daily     06/21/17 1630       Note:  This document was prepared using Dragon voice recognition software and may include unintentional dictation errors.    Johnn Hai, PA-C 06/21/17 1639    Nena Polio, MD 06/22/17 2222

## 2017-06-21 NOTE — ED Notes (Signed)
See triage note  States he took a fall about 4 months ago  Was seen at that time for left shoulder pain   But states he is having increased pain to right hip which radiates into right leg

## 2017-07-05 ENCOUNTER — Other Ambulatory Visit: Payer: Self-pay

## 2017-07-05 ENCOUNTER — Encounter
Admission: RE | Admit: 2017-07-05 | Discharge: 2017-07-05 | Disposition: A | Discharge status: Home / Self Care | Payer: Medicare Other | Source: Ambulatory Visit | Attending: Surgery | Admitting: Surgery

## 2017-07-05 DIAGNOSIS — E1122 Type 2 diabetes mellitus with diabetic chronic kidney disease: Secondary | ICD-10-CM | POA: Diagnosis not present

## 2017-07-05 DIAGNOSIS — Z87891 Personal history of nicotine dependence: Secondary | ICD-10-CM | POA: Diagnosis not present

## 2017-07-05 DIAGNOSIS — B20 Human immunodeficiency virus [HIV] disease: Secondary | ICD-10-CM | POA: Diagnosis not present

## 2017-07-05 DIAGNOSIS — K219 Gastro-esophageal reflux disease without esophagitis: Secondary | ICD-10-CM | POA: Diagnosis not present

## 2017-07-05 DIAGNOSIS — M109 Gout, unspecified: Secondary | ICD-10-CM | POA: Diagnosis not present

## 2017-07-05 DIAGNOSIS — D631 Anemia in chronic kidney disease: Secondary | ICD-10-CM | POA: Diagnosis not present

## 2017-07-05 DIAGNOSIS — I2510 Atherosclerotic heart disease of native coronary artery without angina pectoris: Secondary | ICD-10-CM | POA: Diagnosis not present

## 2017-07-05 DIAGNOSIS — I130 Hypertensive heart and chronic kidney disease with heart failure and stage 1 through stage 4 chronic kidney disease, or unspecified chronic kidney disease: Secondary | ICD-10-CM | POA: Diagnosis not present

## 2017-07-05 DIAGNOSIS — S43432A Superior glenoid labrum lesion of left shoulder, initial encounter: Secondary | ICD-10-CM | POA: Diagnosis not present

## 2017-07-05 DIAGNOSIS — W1830XA Fall on same level, unspecified, initial encounter: Secondary | ICD-10-CM | POA: Diagnosis not present

## 2017-07-05 DIAGNOSIS — M25812 Other specified joint disorders, left shoulder: Secondary | ICD-10-CM | POA: Diagnosis not present

## 2017-07-05 DIAGNOSIS — Z7982 Long term (current) use of aspirin: Secondary | ICD-10-CM | POA: Diagnosis not present

## 2017-07-05 DIAGNOSIS — I350 Nonrheumatic aortic (valve) stenosis: Secondary | ICD-10-CM | POA: Diagnosis not present

## 2017-07-05 DIAGNOSIS — I252 Old myocardial infarction: Secondary | ICD-10-CM | POA: Diagnosis not present

## 2017-07-05 DIAGNOSIS — N183 Chronic kidney disease, stage 3 (moderate): Secondary | ICD-10-CM | POA: Diagnosis not present

## 2017-07-05 DIAGNOSIS — M7522 Bicipital tendinitis, left shoulder: Secondary | ICD-10-CM | POA: Diagnosis not present

## 2017-07-05 DIAGNOSIS — Z7984 Long term (current) use of oral hypoglycemic drugs: Secondary | ICD-10-CM | POA: Diagnosis not present

## 2017-07-05 DIAGNOSIS — M25512 Pain in left shoulder: Secondary | ICD-10-CM | POA: Diagnosis present

## 2017-07-05 DIAGNOSIS — Z79899 Other long term (current) drug therapy: Secondary | ICD-10-CM | POA: Diagnosis not present

## 2017-07-05 DIAGNOSIS — M75122 Complete rotator cuff tear or rupture of left shoulder, not specified as traumatic: Secondary | ICD-10-CM | POA: Diagnosis not present

## 2017-07-05 DIAGNOSIS — Z7902 Long term (current) use of antithrombotics/antiplatelets: Secondary | ICD-10-CM | POA: Diagnosis not present

## 2017-07-05 DIAGNOSIS — I509 Heart failure, unspecified: Secondary | ICD-10-CM | POA: Diagnosis not present

## 2017-07-05 HISTORY — DX: Gastro-esophageal reflux disease without esophagitis: K21.9

## 2017-07-05 HISTORY — DX: Myoneural disorder, unspecified: G70.9

## 2017-07-05 HISTORY — DX: Heart failure, unspecified: I50.9

## 2017-07-05 HISTORY — DX: Type 2 diabetes mellitus without complications: E11.9

## 2017-07-05 HISTORY — DX: Unspecified cirrhosis of liver: K74.60

## 2017-07-05 HISTORY — DX: Other cervical disc displacement, unspecified cervical region: M50.20

## 2017-07-05 LAB — BASIC METABOLIC PANEL
ANION GAP: 8 (ref 5–15)
BUN: 20 mg/dL (ref 6–20)
CALCIUM: 9.8 mg/dL (ref 8.9–10.3)
CHLORIDE: 107 mmol/L (ref 101–111)
CO2: 25 mmol/L (ref 22–32)
Creatinine, Ser: 2.02 mg/dL — ABNORMAL HIGH (ref 0.61–1.24)
GFR calc non Af Amer: 33 mL/min — ABNORMAL LOW (ref >60)
GFR, EST AFRICAN AMERICAN: 38 mL/min — AB (ref >60)
GLUCOSE: 213 mg/dL — AB (ref 65–99)
POTASSIUM: 4.4 mmol/L (ref 3.5–5.1)
Sodium: 140 mmol/L (ref 135–145)

## 2017-07-05 NOTE — Pre-Procedure Instructions (Signed)
As instructed by dr Rosey Bath, request to have medical clearance preop called and faxed to dr hande. Spoke with christie Also called and faxed to dr poggi office. Lm for tiffany. Also patient allergic to bucha-cornsilk-ch grass hydran /low  Reaction listed. Triggered alert for ancef. Consulted with kristin in pharmacy who noted patient has had ancef x 3 last 2016. Left this info also for tiffany to discuss with dr Roland Rack

## 2017-07-05 NOTE — Pre-Procedure Instructions (Signed)
Jared Walker., RN called pharmacy to determine if his allergy to Buchu-cornsilk-ch Grass-hydran would be contraindicated to Ancef.  Pharmacy tech researched the allergy and his use of 'corn' products. Patient denies any allergy to corn products. Pharmacy stated that this patient has had Ancef several times in the past few years.  Will continue the order for antibiotic coverage as written.

## 2017-07-05 NOTE — OR Nursing (Signed)
ECG 12 Lead6/11/2016 [place] Component Name Value Ref Range  EKG Ventricular Rate 70 BPM   EKG Atrial Rate 70 BPM   EKG P-R Interval 154 ms  EKG QRS Duration 88 ms  EKG Q-T Interval 400 ms  EKG QTC Calculation 432 ms  EKG Calculated P Axis -12 degrees   EKG Calculated R Axis 14 degrees   EKG Calculated T Axis 55 degrees   Result Narrative  POSSIBLE ECTOPIC ATRIAL RHYTHM NONSPECIFIC T WAVE ABNORMALITY WHEN COMPARED WITH ECG OF 02-Jan-2016 17:01, RATE HAS INCREASED NO SIGNIFICANT CHANGE WAS FOUND Confirmed by Ishmael Holter MD, Charles [phone]) on 11/12/2016 5:27:37 PM

## 2017-07-05 NOTE — Patient Instructions (Signed)
Your procedure is scheduled on: Thursday, February 7th  Report to [place]  To find out your arrival time please call [phone] between 1PM - 3PM on Wednesday February 6  Remember: Instructions that are not followed completely may result in serious medical risk, up to and including death,  or upon the discretion of your surgeon and anesthesiologist your surgery may need to be rescheduled.     _X__ 1. Do not eat food after midnight the night before your procedure.                 No gum chewing or hard candies.                   You may drink clear liquids up to 2 hours before you are scheduled to arrive for your surgery                  Clear Liquids include:  water, apple juice without pulp, clear carbohydrate                 drink such as Clearfast of Gartorade, Black Coffee or Tea                     (Do not add anything to coffee or tea).  __X__2.  On the morning of surgery brush your teeth with toothpaste and water,                             You may rinse your mouth with mouthwash if you wish.                                      Do not swallow any toothpaste of mouthwash.     _X__ 3.  No Alcohol for 24 hours before or after surgery.   _X__ 4.  Do Not Smoke or use e-cigarettes For 24 Hours Prior to Your Surgery.                 Do not use any chewable tobacco products for at least 6 hours prior to                 surgery.  ____  5.  Bring all medications with you on the day of surgery if instructed.   _X___  6.  Notify your doctor if there is any change in your medical condition      (cold, fever, infections).     Do not wear jewelry, make-up, hairpins, clips or nail polish. Do not wear lotions, powders, or perfumes. You may wear deodorant. Do not shave 48 hours prior to surgery. Men may shave face and neck. Do not bring valuables to the hospital.    [facility] is not responsible for any belongings or valuables.  Contacts,  dentures or bridgework may not be worn into surgery. Leave your suitcase in the car. After surgery it may be brought to your room. For patients admitted to the hospital, discharge time is determined by your treatment team.   Patients discharged the day of surgery will not be allowed to drive home.   Please read over the following fact sheets that you were given:   PREPARING FOR SURGERY   ____ Take these medicines the morning of surgery with A SIP OF WATER:  1. NORVASC  2. LIPITOR  3. COREG  4. IMDUR  5. TORSEMIDE  6. PRILOSEC  ____ Fleet Enema (as directed)   _X___ Use CHG Soap as directed  ____ Use inhalers on the day of surgery  ____ Stop metformin 2 days prior to surgery    _X___ Stop ASPIRIN AND PLAVIX NOW!!  __X__ Stop Anti-inflammatories NOW!!                THIS INCLUDES IBUPROFEN / MOTRIN / ADVIL / ALEVE / NAPROSYN / GOODYS POWDERS               YOU MAY TAKE TYLENOL OR ACETAMINOPHEN FOR DISCOMFORT  __X__ Stop supplements until after surgery.                THIS INCLUDES CRANBERRY, FERROUS SULFATE AND FOLIC ACID.                    YOU MAY CONTINUE THE VITAMIN D3 AND VITAMIN  B12 BUT DO NOT TAKE ON DAY OF SURGERY   ____ Bring C-Pap to the hospital.   CONTINUE TO TAKE ALLOPURINOL, COZAAR AND VALIUM BUT NOT ON THE DAY OF SURGERY  CONTINUE TO TAKE ZOLOFT, DESOVY,  TIVICAY AND PERCOCET AS USUAL.            DO NOT TAKE ON DAY OF SURGERY MORNING.   STOP THE VOLTAREN GEL TODAY.  START TAKING COLACE NOW AND CONTINUE AFTER SURGERY  WEAR LOOSE FITTING CLOTHING, EITHER BUTTON DOWN OR LARGE TSHIRT  *#*#  PLEASE CALL ME WITH THE NAME AND DOSAGE OF DIABETIC MEDICINE                   WHEN YOU RETURN HOME TODAY *#*#

## 2017-07-05 NOTE — Pre-Procedure Instructions (Signed)
The patient stated that he had been told to stop his Plavix today.  According to previous cardiology notes, that was stopped last June.  Discussed patient and his blood sugars and he stated "his sugars were elevated due to steroid use".  His A1C was 10.0. No documented medications on epic for diabetes. Patient also stated that he was just started on a pill for this.  (Glimipiride) which when researching his previous appointments, it was noted that he has been on this for at least 8 months. Somewhat unsure if patient is aware of all his medications.  Asked him to call back to review his meds once he had them in front of him for review.

## 2017-07-05 NOTE — Unmapped (Deleted)
Assessment/Plan:            .   HIV:  Viral load well controlled on current ART (Descovy/dolutegravir), which he tolerates well. We discussed labs.  Will continue current regimen. Cirrhosis and renal insufficiency are his biggest current medical problems.    S/p L shoulder injury: MRI pending per pt. Followed by his PCP.    Cirrhosis: Followed by GI.     SVR following Zepatier for Chronic HCV Infection (genotype 1a): Stable.    CKD: Stage 3 -- Followed by Dr Stefano Gaul.    HTN: Stable.    Sexual Health:     Mental Health: Stable.    Health maintenance:   Pap N/A  RPR False + (1:2 with neg FTA 11/12/2015)  GC/CT - neg 08/26/2016  UA 01/10/2016: 30 mg protein protein/creat ratio 0.162  Hb A1C 11/12/2015: 5.7   Cholesterol 11/12/2015: total cholesterol 148, HDL 37, LDL (calc) 80, triglycerides 155  TB screening neg, 06/10/2016  Lung cancer screening N/A (quit 1993 after 1 ppd x 10 yrs)  Colonoscopy - Requested for screening        Prevention, adherence and health education:   .  .  .  .  .  .  .    Educational and counseling services took 25  minutes of today's visit.    Follow-up:  Return to clinic in 1 month or sooner if needed.   Subjective:    [name] is a 67 y.o. male who presents to the Infectious Disease clinic for return HIV visit.     Chief Complaint: No chief complaint on file.      HPI:   HIV diagnosed in 12/1999. In 0120/02, CD4 count was 547 (25%) with a viral load of about 5293. For a number of years, his viral load and CD4 count were generally reasonably well maintained in the absence of antiretroviral therapy, and he never had any HIV-related symptoms. However, during 2008-2009, his viral load dramatically increased and his CD4 count fell. Viral load reached a height of 309,000 on 08/31/2007 and CD4 count fell substantially to 244 on 09/29/2007. He had always been reluctant to take antiviral medications and in fact consistently and repeatedly refused hepatitis C treatment as well as HIV treatment in the past. However, because of the deterioration of his CD4 count and viral load, I prescribed Atripla on 10/12/07. Although he agreed to take it he did not done so until more than a year later, despite assurances during each visit that he would begin ART immediately. Once he finally started ART, however, he tolerated it quite well. On 07/24/2015 he was switched from Atripla to Descovy/dolutegravir b/o CKD.     His HIV has been well controlled, but he has multiple severe comorbidities, including HCV (SVR after 12 wks of Zepatier 04/20/2016-07/13/2016; HCV RNA BDL 10/28/2016), resultant cirrhosis (Metavir F4), CKD Stage 3-4a, NSTEMI 10/2015. He is followed by Nephrology, GI, and Cardiology.     **His course has been eventful during the past few months. During 10/2016 he experiencedincreased creatinine (to 3.75), hypervolemia, abdominal distention -- felt likely due to worsened cirrhosis.      Larey Seat because of a pot hole four days ago and sustained a chipped L shoulder. Went to ER; also seen by his PCP. MRI of L shoulder reportedly scheduled. Still feels sore, but otherwise systemically well. Has missed no ART doses.***    ROS otherwise neg.     He was seen by Dr Stefano Gaul in 04/2017; amlodipine  d/c'd. Has not seen Cardiology since 10/2016    ***DO SURVEILLANCE LIVER US; F/U WITH CARDS, LIVER; NOTE LOW CD4!      Past Medical History:   Diagnosis Date   ??? CKD (chronic kidney disease) stage 3, GFR 30-59 ml/min (CMS-HCC) 01/08/2015   ??? Coronary artery disease 10/2015    Multivessel disease, plan medical management after cath 6/17   ??? GERD (gastroesophageal reflux disease)    ??? Gout    ??? HIV (human immunodeficiency virus infection) (CMS-HCC)     Undectable viral load 5/17   ??? Hypertension    ??? Nephrolithiasis    ??? Nephrotic syndrome 03/05/2014   ??? Renal mass     Followed by neprhology, MRI 8/16 improved, felt to be benign       Medications:  Current Outpatient Prescriptions   Medication Sig Dispense Refill   ??? albuterol (PROVENTIL HFA;VENTOLIN HFA) 90 mcg/actuation inhaler Inhale 2 puffs.     ??? allopurinol (ZYLOPRIM) 300 MG tablet Take 300 mg by mouth daily.   0   ??? amLODIPine (NORVASC) 2.5 MG tablet Take 1 tablet (2.5 mg total) by mouth every morning before breakfast. 90 tablet 3   ??? aspirin (ECOTRIN) 81 MG tablet   0   ??? atorvastatin (LIPITOR) 20 MG tablet TAKE 1 TABLET (20 MG) BY MOUTH ONCE DAILY 30 tablet PRN   ??? baclofen (LIORESAL) 10 MG tablet Take 1 tablet (10 mg total) by mouth Three (3) times a day as needed for muscle spasms. 30 tablet 2   ??? calcium carbonate-vitamin D2 500 mg(1,250mg ) -200 unit tablet Take 1 tablet by mouth Two (2) times a day.     ??? carvedilol (COREG) 12.5 MG tablet TAKE 1 TABLET BY MOUTH TWICE DAILY 60 tablet 9   ??? cyanocobalamin 1000 MCG tablet Take 1,000 mcg by mouth.     ??? diazepam (VALIUM) 5 MG tablet Take 5 mg by mouth nightly.      ??? docusate sodium (COLACE) 100 MG capsule Take 1 capsule (100 mg total) by mouth every twelve (12) hours. (Patient taking differently: Take 100 mg by mouth two (2) times a day as needed. ) 60 capsule 0   ??? dolutegravir (TIVICAY) 50 mg Tab TABLET Take 1 tablet (50 mg total) by mouth daily. 30 tablet 11   ??? emtricitabine-tenofovir alafen (DESCOVY) 200-25 mg tablet Take 1 tablet by mouth daily. 30 tablet 11   ??? ferrous sulfate 325 (65 FE) MG tablet Take 325 mg by mouth daily with breakfast.      ??? folic acid (FOLVITE) 1 MG tablet   0   ??? glimepiride (AMARYL) 1 MG tablet take 1 tablet by mouth once daily with BREAKFAST  0   ??? HYDROcodone-acetaminophen (NORCO) 5-325 mg per tablet Directions are take one tablet 30 minutes before physical therapy, then take one tablet at bedtime as needed for pain     ??? nitroglycerin (NITROSTAT) 0.4 MG SL tablet Place 1 tablet (0.4 mg total) under the tongue every five (5) minutes as needed for chest pain. 25 tablet 4   ??? omega-3 fatty acids-fish oil 340-1,000 mg capsule Take 1 capsule by mouth.     ??? ondansetron (ZOFRAN) 4 MG tablet take 1 tablet by mouth every 8 hours if needed for nausea     ??? oxyCODONE-acetaminophen (PERCOCET) 5-325 mg per tablet Take 1 tablet by mouth.     ??? pantoprazole (PROTONIX) 40 MG tablet Take 40 mg by mouth daily.     ???  predniSONE (DELTASONE) 10 MG tablet Take one tablet daily by mouth for the next five days.     ??? pregabalin (LYRICA) 25 MG capsule Take 1 capsule (25 mg total) by mouth once daily. 30 capsule 2   ??? sertraline (ZOLOFT) 50 MG tablet take 1 tablet by mouth once daily     ??? spironolactone (ALDACTONE) 25 MG tablet TAKE 1 TABLET BY MOUTH EVERY DAY 90 tablet 0   ??? torsemide (DEMADEX) 20 MG tablet Take 2 tablets (40 mg total) by mouth daily. 90 tablet 3     No current facility-administered medications for this visit.        Allergies: Colchicine analogues and Tramadol    Social History:  As per MEDICAL RECORD NUMBERNo cigs or alcohol at all. No current IDU (quit many years ago). Lives with wife.     Review of Systems:  A 12 point review of systems was negative except for pertinent items noted in the HPI.    Objective:       There were no vitals taken for this visit.    GEN:  looks well, no apparent distress  EYES: sclerae anicteric and non injected  XBJ:YNWGNFAO on top and bottom. No lesions.  LYMPH:no cervical or supraclavicular LAD  CV:no peripheral edema, normal pulses. Nl s1, s2, without s3, s4 +2/6 sys m diffusely over precordium (except no radiation to neck or axilla)  PULM:CTAB ant/post, normal work of breathing  ABD: Protuberant - but not taut, nontender, no masses felt.  ZH:YQMVHQIO  RECTAL:deferred  SKIN:no petechiae, ecchymoses or obvious rashes on clothed exam  MSK: L shoulder abduction limited by pain; 1+ pitting edema bilat  NEURO:CN II-XII intact  PSYCH:attentive, appropriate affect, good eye contact, fluent speech    Recent Labs:    Lab Results   Component Value Date    RPR Reactive (A) 11/12/2015    A1C 5.7 11/12/2015    CHOL 111 09/16/2016    CHOL 148 11/12/2015    CHOL 120 07/24/2015    LDL 39 (L) 09/16/2016 LDL 80 11/12/2015    LDL 46 (L) 07/24/2015    HDL 32 (L) 09/16/2016    HDL 37 (L) 11/12/2015    HDL 52 07/24/2015    TRIG 962 (H) 09/16/2016    TRIG 155 (H) 11/12/2015    TRIG 111 07/24/2015    CHOLHDLRATIO 3.5 09/16/2016    CHOLHDLRATIO 4.0 11/12/2015    CHOLHDLRATIO 2.3 07/24/2015    QFTTBGOLD Negative 06/10/2016         Absolute CD4 Count   Date Value Ref Range Status   04/02/2017 290 (L) 510-2,320 /uL Final   09/16/2016 547 510 - 2,320 /uL Final   03/11/2016 551 510 - 2,320 /uL Final   10/16/2015 487 (L) 510 - 2,320 /uL Final   08/01/2014 514 510 - 2,320 /uL Final   01/03/2014 430 (L) 510 - 2,320 /uL Final   11/23/2012 549 510 - 2,320 /uL Final   07/20/2012 470 (L) 510 - 2,320 CELLS/UL Final     HIV RNA   Date Value Ref Range Status   04/02/2017 <40 (H) <0 copies/mL Final   09/16/2016 <40 (H) <0 copies/mL Final   03/11/2016 <40 (H) <0 copies/mL Final      Lab Results   Component Value Date    WBC 13.2 (H) 04/02/2017    WBC 8.4 08/01/2014    HGB 13.1 (L) 04/02/2017    HGB 11.1 (L) 08/01/2014    PLT 171 04/02/2017  PLT 181 08/01/2014    CREATININE 2.03 (H) 04/02/2017    CREATININE 1.68 (H) 08/15/2014    AST 40 04/02/2017    AST 50 08/01/2014    ALT 78 (H) 04/02/2017    ALT 60 08/01/2014    TRIG 198 (H) 09/16/2016    TRIG 80 01/03/2014    HDL 32 (L) 09/16/2016    HDL 53 01/03/2014    LDL 39 (L) 09/16/2016    LDL 71 01/03/2014       Immunization History   Administered Date(s) Administered   ??? Hepatitis B, Adult 04/06/2016, 05/21/2016, 08/24/2016   ??? INFLUENZA TIV (TRI) PF (IM) 02/08/2008, 03/26/2010, 03/25/2011   ??? Influenza Vaccine Quad (IIV4 PF) 41mo+ injectable 07/24/2015, 02/05/2016   ??? Influenza Virus Vaccine, unspecified formulation 06/15/2014, 07/24/2015, 02/05/2016, 03/15/2016, 03/31/2017   ??? PNEUMOCOCCAL POLYSACCHARIDE 23 07/13/2000, 08/19/2005   ??? PPD Test 12/29/2006, 07/11/2008, 03/26/2010, 03/25/2011   ??? Pneumococcal Conjugate 13-Valent 04/13/2012   ??? SHINGRIX-ZOSTER VACCINE (HZV), RECOMBINANT,SUB-UNIT,ADJUVANTED IM 09/16/2016   ??? TdaP 08/31/2007

## 2017-07-07 MED ORDER — CEFAZOLIN SODIUM-DEXTROSE 2-4 GM/100ML-% IV SOLN
2.0000 g | Freq: Once | INTRAVENOUS | Status: AC
  Administered 2017-07-08: 2 g via INTRAVENOUS

## 2017-07-08 ENCOUNTER — Ambulatory Visit: Payer: Medicare Other | Admitting: Anesthesiology

## 2017-07-08 ENCOUNTER — Encounter
Admission: RE | Disposition: A | Discharge status: Home / Self Care | Payer: Self-pay | Source: Ambulatory Visit | Attending: Surgery

## 2017-07-08 ENCOUNTER — Ambulatory Visit
Admission: RE | Admit: 2017-07-08 | Discharge: 2017-07-08 | Disposition: A | Discharge status: Home / Self Care | Payer: Medicare Other | Source: Ambulatory Visit | Attending: Surgery | Admitting: Surgery

## 2017-07-08 DIAGNOSIS — Z7984 Long term (current) use of oral hypoglycemic drugs: Secondary | ICD-10-CM | POA: Insufficient documentation

## 2017-07-08 DIAGNOSIS — M25812 Other specified joint disorders, left shoulder: Secondary | ICD-10-CM | POA: Insufficient documentation

## 2017-07-08 DIAGNOSIS — B20 Human immunodeficiency virus [HIV] disease: Secondary | ICD-10-CM | POA: Insufficient documentation

## 2017-07-08 DIAGNOSIS — M109 Gout, unspecified: Secondary | ICD-10-CM | POA: Insufficient documentation

## 2017-07-08 DIAGNOSIS — D631 Anemia in chronic kidney disease: Secondary | ICD-10-CM | POA: Insufficient documentation

## 2017-07-08 DIAGNOSIS — K219 Gastro-esophageal reflux disease without esophagitis: Secondary | ICD-10-CM | POA: Insufficient documentation

## 2017-07-08 DIAGNOSIS — S43432A Superior glenoid labrum lesion of left shoulder, initial encounter: Secondary | ICD-10-CM | POA: Insufficient documentation

## 2017-07-08 DIAGNOSIS — I252 Old myocardial infarction: Secondary | ICD-10-CM | POA: Insufficient documentation

## 2017-07-08 DIAGNOSIS — Z7982 Long term (current) use of aspirin: Secondary | ICD-10-CM | POA: Insufficient documentation

## 2017-07-08 DIAGNOSIS — M75122 Complete rotator cuff tear or rupture of left shoulder, not specified as traumatic: Secondary | ICD-10-CM | POA: Insufficient documentation

## 2017-07-08 DIAGNOSIS — I130 Hypertensive heart and chronic kidney disease with heart failure and stage 1 through stage 4 chronic kidney disease, or unspecified chronic kidney disease: Secondary | ICD-10-CM | POA: Insufficient documentation

## 2017-07-08 DIAGNOSIS — Z7902 Long term (current) use of antithrombotics/antiplatelets: Secondary | ICD-10-CM | POA: Insufficient documentation

## 2017-07-08 DIAGNOSIS — I350 Nonrheumatic aortic (valve) stenosis: Secondary | ICD-10-CM | POA: Insufficient documentation

## 2017-07-08 DIAGNOSIS — Z87891 Personal history of nicotine dependence: Secondary | ICD-10-CM | POA: Insufficient documentation

## 2017-07-08 DIAGNOSIS — I509 Heart failure, unspecified: Secondary | ICD-10-CM | POA: Insufficient documentation

## 2017-07-08 DIAGNOSIS — N183 Chronic kidney disease, stage 3 (moderate): Secondary | ICD-10-CM | POA: Insufficient documentation

## 2017-07-08 DIAGNOSIS — Z79899 Other long term (current) drug therapy: Secondary | ICD-10-CM | POA: Insufficient documentation

## 2017-07-08 DIAGNOSIS — E1122 Type 2 diabetes mellitus with diabetic chronic kidney disease: Secondary | ICD-10-CM | POA: Insufficient documentation

## 2017-07-08 DIAGNOSIS — W1830XA Fall on same level, unspecified, initial encounter: Secondary | ICD-10-CM | POA: Insufficient documentation

## 2017-07-08 DIAGNOSIS — I2510 Atherosclerotic heart disease of native coronary artery without angina pectoris: Secondary | ICD-10-CM | POA: Insufficient documentation

## 2017-07-08 DIAGNOSIS — M7522 Bicipital tendinitis, left shoulder: Secondary | ICD-10-CM | POA: Insufficient documentation

## 2017-07-08 HISTORY — PX: SHOULDER ARTHROSCOPY WITH OPEN ROTATOR CUFF REPAIR: SHX6092

## 2017-07-08 LAB — GLUCOSE, CAPILLARY
GLUCOSE-CAPILLARY: 221 mg/dL — AB (ref 65–99)
Glucose-Capillary: 201 mg/dL — ABNORMAL HIGH (ref 65–99)

## 2017-07-08 SURGERY — ARTHROSCOPY, SHOULDER WITH REPAIR, ROTATOR CUFF, OPEN
Anesthesia: General | Site: Shoulder | Laterality: Left | Wound class: Clean

## 2017-07-08 MED ORDER — FENTANYL CITRATE (PF) 100 MCG/2ML IJ SOLN
INTRAMUSCULAR | Status: AC
  Filled 2017-07-08: qty 2

## 2017-07-08 MED ORDER — EPHEDRINE SULFATE 50 MG/ML IJ SOLN
INTRAMUSCULAR | Status: DC | PRN
  Administered 2017-07-08 (×6): 10 mg via INTRAVENOUS

## 2017-07-08 MED ORDER — ONDANSETRON HCL 4 MG/2ML IJ SOLN
INTRAMUSCULAR | Status: DC | PRN
  Administered 2017-07-08: 4 mg via INTRAVENOUS

## 2017-07-08 MED ORDER — FENTANYL CITRATE (PF) 100 MCG/2ML IJ SOLN
50.0000 ug | Freq: Once | INTRAMUSCULAR | Status: AC
  Administered 2017-07-08: 50 ug via INTRAVENOUS

## 2017-07-08 MED ORDER — ROCURONIUM BROMIDE 100 MG/10ML IV SOLN
INTRAVENOUS | Status: DC | PRN
  Administered 2017-07-08: 50 mg via INTRAVENOUS
  Administered 2017-07-08: 10 mg via INTRAVENOUS

## 2017-07-08 MED ORDER — PROPOFOL 10 MG/ML IV BOLUS
INTRAVENOUS | Status: AC
  Filled 2017-07-08: qty 20

## 2017-07-08 MED ORDER — MEPERIDINE HCL 50 MG/ML IJ SOLN: 6.2500 mg | INTRAMUSCULAR | Status: DC | PRN

## 2017-07-08 MED ORDER — MIDAZOLAM HCL 2 MG/2ML IJ SOLN
INTRAMUSCULAR | Status: DC | PRN
  Administered 2017-07-08: 2 mg via INTRAVENOUS

## 2017-07-08 MED ORDER — OXYCODONE HCL 5 MG PO TABS: 5.0000 mg | ORAL_TABLET | ORAL | 0 refills | Status: DC | PRN

## 2017-07-08 MED ORDER — DEXAMETHASONE SODIUM PHOSPHATE 10 MG/ML IJ SOLN
INTRAMUSCULAR | Status: DC | PRN
  Administered 2017-07-08: 10 mg via INTRAVENOUS

## 2017-07-08 MED ORDER — MIDAZOLAM HCL 2 MG/2ML IJ SOLN
1.0000 mg | Freq: Once | INTRAMUSCULAR | Status: AC
  Administered 2017-07-08: 1 mg via INTRAVENOUS

## 2017-07-08 MED ORDER — LIDOCAINE HCL (PF) 1 % IJ SOLN
INTRAMUSCULAR | Status: AC
  Filled 2017-07-08: qty 5

## 2017-07-08 MED ORDER — ONDANSETRON HCL 4 MG/2ML IJ SOLN: 4.0000 mg | Freq: Four times a day (QID) | INTRAMUSCULAR | Status: DC | PRN

## 2017-07-08 MED ORDER — MIDAZOLAM HCL 2 MG/2ML IJ SOLN
INTRAMUSCULAR | Status: AC
  Administered 2017-07-08: 1 mg via INTRAVENOUS
  Filled 2017-07-08: qty 2

## 2017-07-08 MED ORDER — FENTANYL CITRATE (PF) 100 MCG/2ML IJ SOLN
25.0000 ug | INTRAMUSCULAR | Status: DC | PRN
  Administered 2017-07-08: 50 ug via INTRAVENOUS
  Administered 2017-07-08 (×2): 25 ug via INTRAVENOUS

## 2017-07-08 MED ORDER — METOCLOPRAMIDE HCL 5 MG/ML IJ SOLN: 5.0000 mg | Freq: Three times a day (TID) | INTRAMUSCULAR | Status: DC | PRN

## 2017-07-08 MED ORDER — LIDOCAINE HCL (PF) 1 % IJ SOLN
INTRAMUSCULAR | Status: DC | PRN
  Administered 2017-07-08: 3 mL

## 2017-07-08 MED ORDER — ONDANSETRON HCL 4 MG/2ML IJ SOLN
INTRAMUSCULAR | Status: AC
  Filled 2017-07-08: qty 2

## 2017-07-08 MED ORDER — EPHEDRINE SULFATE 50 MG/ML IJ SOLN
INTRAMUSCULAR | Status: AC
  Filled 2017-07-08: qty 1

## 2017-07-08 MED ORDER — SUGAMMADEX SODIUM 200 MG/2ML IV SOLN
INTRAVENOUS | Status: AC
  Filled 2017-07-08: qty 2

## 2017-07-08 MED ORDER — POTASSIUM CHLORIDE IN NACL 20-0.9 MEQ/L-% IV SOLN
INTRAVENOUS | Status: DC
  Filled 2017-07-08 (×5): qty 1000

## 2017-07-08 MED ORDER — PROMETHAZINE HCL 25 MG/ML IJ SOLN: 6.2500 mg | INTRAMUSCULAR | Status: DC | PRN

## 2017-07-08 MED ORDER — FENTANYL CITRATE (PF) 100 MCG/2ML IJ SOLN
INTRAMUSCULAR | Status: DC | PRN
  Administered 2017-07-08: 100 ug via INTRAVENOUS

## 2017-07-08 MED ORDER — OXYCODONE HCL 5 MG PO TABS: 5.0000 mg | ORAL_TABLET | ORAL | Status: DC | PRN

## 2017-07-08 MED ORDER — BUPIVACAINE-EPINEPHRINE 0.5% -1:200000 IJ SOLN
INTRAMUSCULAR | Status: DC | PRN
  Administered 2017-07-08: 30 mL

## 2017-07-08 MED ORDER — PROPOFOL 10 MG/ML IV BOLUS
INTRAVENOUS | Status: DC | PRN
  Administered 2017-07-08: 150 mg via INTRAVENOUS

## 2017-07-08 MED ORDER — ONDANSETRON HCL 4 MG PO TABS: 4.0000 mg | ORAL_TABLET | Freq: Four times a day (QID) | ORAL | Status: DC | PRN

## 2017-07-08 MED ORDER — MIDAZOLAM HCL 2 MG/2ML IJ SOLN
INTRAMUSCULAR | Status: AC
  Filled 2017-07-08: qty 2

## 2017-07-08 MED ORDER — BUPIVACAINE LIPOSOME 1.3 % IJ SUSP
INTRAMUSCULAR | Status: DC | PRN
  Administered 2017-07-08: 20 mL via PERINEURAL

## 2017-07-08 MED ORDER — LACTATED RINGERS IV SOLN
INTRAVENOUS | Status: DC | PRN
  Administered 2017-07-08: 2 mL

## 2017-07-08 MED ORDER — BUPIVACAINE HCL (PF) 0.5 % IJ SOLN
INTRAMUSCULAR | Status: AC
  Filled 2017-07-08: qty 10

## 2017-07-08 MED ORDER — SODIUM CHLORIDE 0.9 % IV SOLN
INTRAVENOUS | Status: DC
  Administered 2017-07-08: 13:00:00 via INTRAVENOUS

## 2017-07-08 MED ORDER — BUPIVACAINE-EPINEPHRINE (PF) 0.5% -1:200000 IJ SOLN
INTRAMUSCULAR | Status: AC
  Filled 2017-07-08: qty 30

## 2017-07-08 MED ORDER — BUPIVACAINE HCL (PF) 0.5 % IJ SOLN
INTRAMUSCULAR | Status: DC | PRN
  Administered 2017-07-08: 10 mL via PERINEURAL

## 2017-07-08 MED ORDER — METOCLOPRAMIDE HCL 10 MG PO TABS: 5.0000 mg | ORAL_TABLET | Freq: Three times a day (TID) | ORAL | Status: DC | PRN

## 2017-07-08 MED ORDER — ROCURONIUM BROMIDE 50 MG/5ML IV SOLN
INTRAVENOUS | Status: AC
  Filled 2017-07-08: qty 1

## 2017-07-08 MED ORDER — BUPIVACAINE LIPOSOME 1.3 % IJ SUSP
INTRAMUSCULAR | Status: AC
  Filled 2017-07-08: qty 20

## 2017-07-08 MED ORDER — FENTANYL CITRATE (PF) 100 MCG/2ML IJ SOLN
INTRAMUSCULAR | Status: AC
  Administered 2017-07-08: 50 ug via INTRAVENOUS
  Filled 2017-07-08: qty 2

## 2017-07-08 MED ORDER — SUGAMMADEX SODIUM 200 MG/2ML IV SOLN
INTRAVENOUS | Status: DC | PRN
  Administered 2017-07-08: 200 mg via INTRAVENOUS

## 2017-07-08 MED ORDER — FENTANYL CITRATE (PF) 100 MCG/2ML IJ SOLN
INTRAMUSCULAR | Status: AC
  Administered 2017-07-08: 25 ug via INTRAVENOUS
  Filled 2017-07-08: qty 2

## 2017-07-08 SURGICAL SUPPLY — 45 items
ANCHOR JUGGERKNOT WTAP NDL 2.9 (Anchor) ×6 IMPLANT
ANCHOR SUT QUATTRO KNTLS 4.5 (Anchor) ×2 IMPLANT
ANCHOR SUT W/ ORTHOCORD (Anchor) ×4 IMPLANT
ANCHOR TENDON REGENETEN (Staple) ×2 IMPLANT
ANCHORS BONE REGENETEN (Anchor) ×2 IMPLANT
BIT DRILL JUGRKNT W/NDL BIT2.9 (DRILL) ×1 IMPLANT
BLADE FULL RADIUS 3.5 (BLADE) ×2 IMPLANT
BUR ACROMIONIZER 4.0 (BURR) ×2 IMPLANT
CANNULA SHAVER 8MMX76MM (CANNULA) ×4 IMPLANT
CHLORAPREP W/TINT 26ML (MISCELLANEOUS) ×2 IMPLANT
COVER MAYO STAND STRL (DRAPES) ×2 IMPLANT
DRAPE IMP U-DRAPE 54X76 (DRAPES) ×4 IMPLANT
DRILL JUGGERKNOT W/NDL BIT 2.9 (DRILL) ×2
ELECT REM PT RETURN 9FT ADLT (ELECTROSURGICAL) ×2
ELECTRODE REM PT RTRN 9FT ADLT (ELECTROSURGICAL) ×1 IMPLANT
GAUZE PETRO XEROFOAM 1X8 (MISCELLANEOUS) ×2 IMPLANT
GAUZE SPONGE 4X4 12PLY STRL (GAUZE/BANDAGES/DRESSINGS) ×2 IMPLANT
GLOVE BIO SURGEON STRL SZ7.5 (GLOVE) ×4 IMPLANT
GLOVE BIO SURGEON STRL SZ8 (GLOVE) ×4 IMPLANT
GLOVE BIOGEL PI IND STRL 8 (GLOVE) ×1 IMPLANT
GLOVE BIOGEL PI INDICATOR 8 (GLOVE) ×1
GLOVE INDICATOR 8.0 STRL GRN (GLOVE) ×2 IMPLANT
GOWN STRL REUS W/ TWL LRG LVL3 (GOWN DISPOSABLE) ×1 IMPLANT
GOWN STRL REUS W/ TWL XL LVL3 (GOWN DISPOSABLE) ×1 IMPLANT
GOWN STRL REUS W/TWL LRG LVL3 (GOWN DISPOSABLE) ×1
GOWN STRL REUS W/TWL XL LVL3 (GOWN DISPOSABLE) ×1
GRASPER SUT 15 45D LOW PRO (SUTURE) ×2 IMPLANT
IMPLANT REGENETEN MEDIUM (Shoulder) ×2 IMPLANT
IV LACTATED RINGER IRRG 3000ML (IV SOLUTION) ×2
IV LR IRRIG 3000ML ARTHROMATIC (IV SOLUTION) ×2 IMPLANT
MANIFOLD NEPTUNE II (INSTRUMENTS) ×2 IMPLANT
MASK FACE SPIDER DISP (MASK) ×2 IMPLANT
MAT BLUE FLOOR 46X72 FLO (MISCELLANEOUS) ×2 IMPLANT
PACK ARTHROSCOPY SHOULDER (MISCELLANEOUS) ×2 IMPLANT
SLING ARM LRG DEEP (SOFTGOODS) IMPLANT
SLING ULTRA II LG (MISCELLANEOUS) ×2 IMPLANT
STAPLER SKIN PROX 35W (STAPLE) ×2 IMPLANT
STRAP SAFETY 5IN WIDE (MISCELLANEOUS) ×4 IMPLANT
SUT ETHIBOND 0 MO6 C/R (SUTURE) ×2 IMPLANT
SUT VIC AB 2-0 CT1 27 (SUTURE) ×2
SUT VIC AB 2-0 CT1 TAPERPNT 27 (SUTURE) ×2 IMPLANT
TAPE MICROFOAM 4IN (TAPE) ×2 IMPLANT
TUBING ARTHRO INFLOW-ONLY STRL (TUBING) ×2 IMPLANT
TUBING CONNECTING 10 (TUBING) ×2 IMPLANT
WAND HAND CNTRL MULTIVAC 90 (MISCELLANEOUS) ×2 IMPLANT

## 2017-07-08 NOTE — Op Note (Signed)
07/08/2017  4:18 PM  Patient:   Jared Walker  Pre-Op Diagnosis:   Impingement/tendinopathy with rotator cuff tear, left shoulder.  Post-Op Diagnosis: Impingement/tendinopathy with supraspinatus and subscapularis rotator cuff tears, SLAP tear, and biceps tendinopathy, left shoulder.  Procedure: Limited arthroscopic debridement, arthroscopic SLAP repair, arthroscopic subscapularis tendon repair, scopic subacromial decompression, mini-open supra spinatus tendon repair with application of a Regeneron patch, and mini-open biceps tenodesis, left shoulder.  Anesthesia: General endotracheal with interscalene block placed preoperatively by the anesthesiologist.  Surgeon:   Pascal Lux, MD  Assistant:   Cameron Proud, PA-C; Clearnce Sorrel, PA-S  Findings: As above. There was a type II SLAP tear extending from the 1130 to 1 o'clock position with degenerative fraying of the labral margins. There was an articular sided near full-thickness tear involving the superior insertional fibers of the subscapularis tendon, as well as a full-thickness tear of the mid-supraspinatus insertional fibers, measuring approximately 1.5 x 1.5 cm. The infraspinatus insertional fibers appear to be attenuated but intact. The articular surfaces of the glenoid and humerus both were in satisfactory condition.  Complications: None  Fluids:   700 cc  Estimated blood loss: 5 cc  Tourniquet time: None  Drains: None  Closure: Staples   Brief clinical note: The patient is a 67 year old male with a history of left shoulder pain following an injury last fall. The patient's symptoms have progressed despite medications, activity modification, etc. The patient's history and examination are consistent with impingement/tendinopathy with a rotator cuff tear. These findings were confirmed by MRI scan. The patient presents at this time for definitive management of these shoulder symptoms.  Procedure: The  patient underwent placement of an interscalene block by the anesthesiologist in the preoperative holding area before being brought into the operating room and lain in the supine position. The patient then underwent general endotracheal intubation and anesthesia before being repositioned in the beach chair position using the beach chair positioner. The left shoulder and upper extremity were prepped with ChloraPrep solution before being draped sterilely. Preoperative antibiotics were administered. A timeout was performed to confirm the proper surgical site before the expected portal sites and incision site were injected with 0.5% Sensorcaine with epinephrine. A posterior portal was created and the glenohumeral joint thoroughly inspected with the findings as described above. An anterior portal was created using an outside-in technique. The labrum and rotator cuff were further probed, again confirming the above-noted findings. Areas of labral fraying and synovitis were debrided back to stable margins using the full-radius resector. The labrum was carefully probed and demonstrated the above noted SLAP tear. After abrading the exposed glenoid rim, this was repaired using a single Mitek BioKnotless anchor placed through a separate superolateral portal site which was created using an outside in technique at approximately the 12 o'clock position. Prior to repairing the SLAP tear, the biceps tendon was released from its labral attachment using the ArthroCare wand. Subsequent probing of the repair demonstrated excellent stability.   Next, the subscapularis tendon was repaired using a second Mitek BioKnotless anchor placed through the anterior portal after abrading the exposed insertion site on the lesser tuberosity with the full-radius resector. The adequacy of repair was verified both by probing as well as with external rotation. The ArthroCare wand was re-inserted and used to obtain hemostasis as well as to "anneal" the  labrum superiorly and anteriorly. The instruments were removed from the joint after suctioning the excess fluid.  The camera was repositioned through the posterior portal into the subacromial space.  A separate lateral portal was created using an outside-in technique. The 3.5 mm full-radius resector was introduced and used to perform a subtotal bursectomy. The ArthroCare wand was then inserted and used to remove the periosteal tissue off the undersurface of the anterior third of the acromion as well as to recess the coracoacromial ligament from its attachment along the anterior and lateral margins of the acromion. The 4.0 mm acromionizing bur was introduced and used to complete the decompression by removing the undersurface of the anterior third of the acromion. The full radius resector was reintroduced to remove any residual bony debris before the ArthroCare wand was reintroduced to obtain hemostasis. The instruments were then removed from the subacromial space after suctioning the excess fluid.  An approximately 4-5 cm incision was made over the anterolateral aspect of the shoulder beginning at the anterolateral corner of the acromion and extending distally in line with the bicipital groove. This incision was carried down through the subcutaneous tissues to expose the deltoid fascia. The raphae between the anterior and middle thirds was identified and this plane developed to provide access into the subacromial space. Additional bursal tissues were debrided sharply using Metzenbaum scissors. The rotator cuff tear was readily identified. The margins were debrided sharply with a #15 blade and the exposed greater tuberosity roughened with a rongeur. The tear was repaired using two Biomet 2.9 mm JuggerKnot anchors. These sutures were then brought back laterally and secured using a [employer] anchor to create a two-layer closure. An apparent watertight closure was obtained.  Because of the attenuated  portion of the infraspinatus tendon, it was felt best to apply a [employer] patch over the repair site and adjacent attenuated infraspinatus tendon tissue. This was performed using the appropriate bone and soft tissue staples. Subsequent inspection of the patch demonstrated it to be stable and in good position.  The bicipital groove was identified by palpation and opened for 1-1.5 cm. The biceps tendon stump was retrieved through this defect. The floor of the bicipital groove was roughened with a curet before another Biomet 2.9 mm JuggerKnot anchor was inserted. Both sets of sutures were passed through the biceps tendon and tied securely to effect the tenodesis. The bicipital sheath was reapproximated using two #0 Ethibond interrupted sutures, incorporating the biceps tendon to further reinforce the tenodesis.  The wound was copiously irrigated with sterile saline solution before the deltoid raphae was reapproximated using 2-0 Vicryl interrupted sutures. The subcutaneous tissues were closed in two layers using 2-0 Vicryl interrupted sutures before the skin was closed using staples. The portal sites also were closed using staples. A sterile bulky dressing was applied to the shoulder before the arm was placed into a shoulder immobilizer. The patient was then awakened, extubated, and returned to the recovery room in satisfactory condition after tolerating the procedure well.

## 2017-07-08 NOTE — Anesthesia Preprocedure Evaluation (Signed)
Anesthesia Evaluation  Patient identified by MRN, date of birth, ID band Patient awake    Reviewed: Allergy & Precautions, NPO status , Patient's Chart, lab work & pertinent test results  History of Anesthesia Complications Negative for: history of anesthetic complications  Airway Mallampati: II  TM Distance: >3 FB Neck ROM: Full    Dental  (+) Edentulous Upper, Edentulous Lower   Pulmonary neg sleep apnea, neg COPD, former smoker,    breath sounds clear to auscultation- rhonchi (-) wheezing      Cardiovascular hypertension, Pt. on medications + CAD, + Past MI and +CHF (preserved EF)  (-) Cardiac Stents and (-) CABG  Rhythm:Regular Rate:Normal - Systolic murmurs and - Diastolic murmurs Echo 16/10/96: - Left ventricle: The cavity size was normal. Wall thickness was   normal. Systolic function was normal. The estimated ejection   fraction was in the range of 60% to 65%. Wall motion was normal;   there were no regional wall motion abnormalities. - Aortic valve: Valve area (VTI): 2.15 cm^2. Valve area (Vmax):   2.04 cm^2. Valve area (Vmean): 2.06 cm^2.   Neuro/Psych PSYCHIATRIC DISORDERS Depression negative neurological ROS     GI/Hepatic GERD  ,(+) Hepatitis -  Endo/Other  diabetes, Oral Hypoglycemic Agents  Renal/GU Renal InsufficiencyRenal disease     Musculoskeletal negative musculoskeletal ROS (+)   Abdominal (+) + obese,   Peds  Hematology  (+) anemia ,   Anesthesia Other Findings Past Medical History: No date: Anemia No date: Aortic stenosis, mild     Comment:  mild AS by echo, 08/2011 No date: Cervical disc herniation No date: CHF (congestive heart failure) (HCC) No date: Chronic kidney disease     Comment:  kidney stones. chronic kidney disease, stage III.               followed by doctor in [place] No date: Cirrhosis [facility])     Comment:  due to hepatitis c not being treated for a long periiod           of time No date: Coronary artery disease No date: Depression 06/2017: Diabetes mellitus without complication ([place])     Comment:  A1C 10.0 No date: Full dentures     Comment:  upper and lower No date: GERD (gastroesophageal reflux disease) No date: Gout No date: Heart murmur     Comment:  no treatment 01/19/2015: Hep C w/o coma, chronic ([place])     Comment:  took treatment and it is now gone.  09/25/11: Hepatitis C 09/25/2011: HIV positive ([place])     Comment:  nondetectable.  No date: Hypertension 2017: MI (myocardial infarction) ([place])     Comment:  NSTEMI No date: Neuromuscular disorder ([place])     Comment:  numbness in fingers most likely due to shoulder damage.   Reproductive/Obstetrics                             Anesthesia Physical Anesthesia Plan  ASA: III  Anesthesia Plan: General   Post-op Pain Management:  Regional for Post-op pain   Induction: Intravenous  PONV Risk Score and Plan: 1 and Ondansetron  Airway Management Planned: Oral ETT  Additional Equipment:   Intra-op Plan:   Post-operative Plan: Extubation in OR  Informed Consent: I have reviewed the patients History and Physical, chart, labs and discussed the procedure including the risks, benefits and alternatives for the proposed anesthesia with the patient or authorized representative who  has indicated his/her understanding and acceptance.   Dental advisory given  Plan Discussed with: CRNA and Anesthesiologist  Anesthesia Plan Comments:         Anesthesia Quick Evaluation

## 2017-07-08 NOTE — OR Nursing (Signed)
Spouse advises not sure if he cannot tolerate oxycodone or not.  "He was on several medicines at one time and we didn't know for sure which one bothered him.  Advises she will call MD if he cannot tolerate oxycodone at home.

## 2017-07-08 NOTE — Anesthesia Post-op Follow-up Note (Signed)
Anesthesia QCDR form completed.        

## 2017-07-08 NOTE — Anesthesia Procedure Notes (Signed)
Anesthesia Regional Block: Interscalene brachial plexus block   Pre-Anesthetic Checklist: ,, timeout performed, Correct Patient, Correct Site, Correct Laterality, Correct Procedure, Correct Position, site marked, Risks and benefits discussed,  Surgical consent,  Pre-op evaluation,  At surgeon's request and post-op pain management  Laterality: Left  Prep: chloraprep       Needles:  Injection technique: Single-shot  Needle Type: Stimiplex     Needle Length: 10cm  Needle Gauge: 20     Additional Needles:   Procedures:,,,, ultrasound used (permanent image in chart),,,,  Narrative:  Start time: 07/08/2017 12:40 PM End time: 07/08/2017 12:46 PM Injection made incrementally with aspirations every 5 mL.  Performed by: Personally  Anesthesiologist: Emmie Niemann, MD  Additional Notes: Functioning IV was confirmed and monitors were applied.  A Stimuplex needle was used. Sterile prep and drape,hand hygiene and sterile gloves were used.  Negative aspiration and negative test dose prior to incremental administration of local anesthetic. The patient tolerated the procedure well.

## 2017-07-08 NOTE — Anesthesia Postprocedure Evaluation (Signed)
Anesthesia Post Note  Patient: Jared Walker  Procedure(s) Performed: SHOULDER ARTHROSCOPY WITH OPEN ROTATOR CUFF REPAIR (Left Shoulder)  Patient location during evaluation: PACU Anesthesia Type: General Level of consciousness: awake and alert Pain management: pain level controlled Vital Signs Assessment: post-procedure vital signs reviewed and stable Respiratory status: spontaneous breathing, nonlabored ventilation, respiratory function stable and patient connected to nasal cannula oxygen Cardiovascular status: blood pressure returned to baseline and stable Postop Assessment: no apparent nausea or vomiting Anesthetic complications: no     Last Vitals:  Vitals:   07/08/17 1657 07/08/17 1704  BP:    Pulse: 77 77  Resp: 12 12  Temp:    SpO2: 93% 93%    Last Pain:  Vitals:   07/08/17 1704  TempSrc:   PainSc: 4                  Molli Barrows

## 2017-07-08 NOTE — H&P (Signed)
Paper H&P to be scanned into permanent record. H&P reviewed and patient re-examined. No changes. 

## 2017-07-08 NOTE — Anesthesia Procedure Notes (Signed)
Procedure Name: Intubation Date/Time: 07/08/2017 1:53 PM Performed by: Lorie Apley, CRNA Pre-anesthesia Checklist: Patient identified, Patient being monitored, Timeout performed, Emergency Drugs available and Suction available Patient Re-evaluated:Patient Re-evaluated prior to induction Oxygen Delivery Method: Circle system utilized Preoxygenation: Pre-oxygenation with 100% oxygen Induction Type: IV induction Ventilation: Mask ventilation without difficulty Laryngoscope Size: Mac, 3 and 4 Grade View: Grade I Tube type: Oral Tube size: 8.0 mm Number of attempts: 1 Airway Equipment and Method: Stylet Placement Confirmation: ETT inserted through vocal cords under direct vision,  positive ETCO2 and breath sounds checked- equal and bilateral Secured at: 21 cm Tube secured with: Tape Dental Injury: Teeth and Oropharynx as per pre-operative assessment

## 2017-07-08 NOTE — Transfer of Care (Signed)
Immediate Anesthesia Transfer of Care Note  Patient: Jared Walker  Procedure(s) Performed: Procedure(s): SHOULDER ARTHROSCOPY WITH OPEN ROTATOR CUFF REPAIR (Left)  Patient Location: PACU  Anesthesia Type:General  Level of Consciousness: sedated  Airway & Oxygen Therapy: Patient Spontanous Breathing and Patient connected to face mask oxygen  Post-op Assessment: Report given to RN and Post -op Vital signs reviewed and stable  Post vital signs: Reviewed and stable  Last Vitals:  Vitals:   07/08/17 1318 07/08/17 1619  BP: 115/78 132/88  Pulse: (!) 57 70  Resp: 15 15  Temp:  (!) 35.9 C  SpO2: 51% 10%    Complications: No apparent anesthesia complications

## 2017-07-08 NOTE — Discharge Instructions (Addendum)
Interscalene Nerve Block with Exparel  1.  For your surgery you have received an Interscalene Nerve Block with Exparel. 2. Nerve Blocks affect many types of nerves, including nerves that control movement, pain and normal sensation.  You may experience feelings such as numbness, tingling, heaviness, weakness or the inability to move your arm or the feeling or sensation that your arm has "fallen asleep". 3. A nerve block with Exparel can last up to 5 days.  Usually the weakness wears off first.  The tingling and heaviness usually wear off next.  Finally you may start to notice pain.  Keep in mind that this may occur in any order.  Once a nerve block starts to wear off it is usually completely gone within 60 minutes. 4. ISNB may cause mild shortness of breath, a hoarse voice, blurry vision, unequal pupils, or drooping of the face on the same side as the nerve block.  These symptoms will usually resolve with the numbness.  Very rarely the procedure itself can cause mild seizures. 5. If needed, your surgeon will give you a prescription for pain medication.  It will take about 60 minutes for the oral pain medication to become fully effective.  So, it is recommended that you start taking this medication before the nerve block first begins to wear off, or when you first begin to feel discomfort. 6. Take your pain medication only as prescribed.  Pain medication can cause sedation and decrease your breathing if you take more than you need for the level of pain that you have. 7. Nausea is a common side effect of many pain medications.  You may want to eat something before taking your pain medicine to prevent nausea. 8. After an Interscalene nerve block, you cannot feel pain, pressure or extremes in temperature in the effected arm.  Because your arm is numb it is at an increased risk for injury.  To decrease the possibility of injury, please practice the following:  a. While you are awake change the position of  your arm frequently to prevent too much pressure on any one area for prolonged periods of time. b.  If you have a cast or tight dressing, check the color or your fingers every couple of hours.  Call your surgeon with the appearance of any discoloration (white or blue). c. If you are given a sling to wear before you go home, please wear it  at all times until the block has completely worn off.  Do not get up at night without your sling. d. Please contact [facility] or your surgeon if you do not begin to regain sensation after 7 days from the surgery.  Anesthesia may be contacted by calling the Same Day Surgery Department, Mon. through Fri., 6 am to 4 pm at [phone].   e. If you experience any other problems or concerns, please contact your surgeon's office. f. If you experience severe or prolonged shortness of breath go to the nearest emergency department.   Keep dressing dry and intact.  May shower after dressing changed on post-op day #4 (Monday).  Cover staples with Band-Aids after drying off. Apply ice frequently to shoulder. Take oxycodone as prescribed when needed.  May supplement with ES Tylenol if necessary. Keep shoulder immobilizer on at all times except may remove for bathing purposes. Follow-up in 10-14 days or as scheduled.     AMBULATORY SURGERY  DISCHARGE INSTRUCTIONS   1) The drugs that you were given will stay in your system until  tomorrow so for the next 24 hours you should not:  A) Drive an automobile B) Make any legal decisions C) Drink any alcoholic beverage   2) You may resume regular meals tomorrow.  Today it is better to start with liquids and gradually work up to solid foods.  You may eat anything you prefer, but it is better to start with liquids, then soup and crackers, and gradually work up to solid foods.   3) Please notify your doctor immediately if you have any unusual bleeding, trouble breathing, redness and pain at the surgery site,  drainage, fever, or pain not relieved by medication.    4) Additional Instructions:        Please contact your physician with any problems or Same Day Surgery at [phone], Monday through Friday 6 am to 4 pm, or [place] at [facility] number at [phone].

## 2017-07-09 ENCOUNTER — Encounter: Payer: Self-pay | Admitting: Surgery

## 2017-07-12 NOTE — Unmapped (Signed)
Patient did not arrive for today's appointment at [facility].

## 2017-07-14 MED ORDER — ATORVASTATIN 20 MG TABLET: 20 mg | tablet | Freq: Every day | 3 refills | 0 days | Status: AC

## 2017-07-14 MED ORDER — ATORVASTATIN 20 MG TABLET
ORAL_TABLET | Freq: Every day | ORAL | 3 refills | 0.00000 days | Status: CP
Start: 2017-07-14 — End: 2017-07-14

## 2017-07-14 MED FILL — BACLOFEN/10MG/TABS: BACLOFEN/10MG/TABS | 30 days supply | Qty: 30 | Fill #3

## 2017-07-14 MED FILL — TIVICAY/50MG/TAB: TIVICAY/50MG/TAB | 30 days supply | Qty: 30 | Fill #5

## 2017-07-14 MED FILL — DESCOVY/200MG/25MG/TABS: DESCOVY/200MG/25MG/TABS | 30 days supply | Qty: 30 | Fill #5

## 2017-07-14 NOTE — Unmapped (Unsigned)
[facility] Refill Coordination Note  Specialty Medication(s): [place], [id number],   Additional Medications shipped: Baclofen, Lipitor    [name], DOB: May 08, 1951  Phone: [phone] (home) , Alternate phone contact: N/A  Phone or address changes today?: No  All above HIPAA information was verified with patient.  Shipping Address: [address]  [place] [address]   Insurance changes? No    Completed refill call assessment today to schedule patient's medication shipment from the [facility] [phone]).      Confirmed the medication and dosage are correct and have not changed: Yes, regimen is correct and unchanged.    Confirmed patient started or stopped the following medications in the past month:  No, there are no changes reported at this time.    Are you tolerating your medication?:  Jahaziel reports tolerating the medication.    ADHERENCE        Did you miss any doses in the past 4 weeks? No missed doses reported.    FINANCIAL/SHIPPING    Delivery Scheduled: Yes, Expected medication delivery date: 07/16/17     Jennye Moccasin did not have any additional questions at this time.    Delivery address validated in [facility] scheduling system: Yes, address listed in [facility] is correct.    We will follow up with patient monthly for standard refill processing and delivery.      Thank you,  Rollen Sox   [facility] Specialty Pharmacist

## 2017-07-15 MED FILL — ATORVASTATIN/20MG/TABS: ATORVASTATIN/20MG/TABS | 90 days supply | Qty: 90 | Fill #0

## 2017-07-17 ENCOUNTER — Emergency Department: Payer: Medicare Other

## 2017-07-17 ENCOUNTER — Other Ambulatory Visit: Payer: Self-pay

## 2017-07-17 ENCOUNTER — Emergency Department
Admission: EM | Admit: 2017-07-17 | Discharge: 2017-07-17 | Disposition: A | Discharge status: Home / Self Care | Payer: Medicare Other | Attending: Emergency Medicine | Admitting: Emergency Medicine

## 2017-07-17 ENCOUNTER — Encounter: Payer: Self-pay | Admitting: Emergency Medicine

## 2017-07-17 DIAGNOSIS — N390 Urinary tract infection, site not specified: Secondary | ICD-10-CM | POA: Diagnosis not present

## 2017-07-17 DIAGNOSIS — Z87891 Personal history of nicotine dependence: Secondary | ICD-10-CM | POA: Insufficient documentation

## 2017-07-17 DIAGNOSIS — B20 Human immunodeficiency virus [HIV] disease: Secondary | ICD-10-CM | POA: Diagnosis not present

## 2017-07-17 DIAGNOSIS — I5042 Chronic combined systolic (congestive) and diastolic (congestive) heart failure: Secondary | ICD-10-CM | POA: Diagnosis not present

## 2017-07-17 DIAGNOSIS — Z7902 Long term (current) use of antithrombotics/antiplatelets: Secondary | ICD-10-CM | POA: Diagnosis not present

## 2017-07-17 DIAGNOSIS — I130 Hypertensive heart and chronic kidney disease with heart failure and stage 1 through stage 4 chronic kidney disease, or unspecified chronic kidney disease: Secondary | ICD-10-CM | POA: Diagnosis not present

## 2017-07-17 DIAGNOSIS — Z79899 Other long term (current) drug therapy: Secondary | ICD-10-CM | POA: Insufficient documentation

## 2017-07-17 DIAGNOSIS — N183 Chronic kidney disease, stage 3 (moderate): Secondary | ICD-10-CM | POA: Diagnosis not present

## 2017-07-17 DIAGNOSIS — E1122 Type 2 diabetes mellitus with diabetic chronic kidney disease: Secondary | ICD-10-CM | POA: Insufficient documentation

## 2017-07-17 DIAGNOSIS — G8929 Other chronic pain: Secondary | ICD-10-CM | POA: Insufficient documentation

## 2017-07-17 DIAGNOSIS — I252 Old myocardial infarction: Secondary | ICD-10-CM | POA: Diagnosis not present

## 2017-07-17 DIAGNOSIS — Z7982 Long term (current) use of aspirin: Secondary | ICD-10-CM | POA: Insufficient documentation

## 2017-07-17 DIAGNOSIS — M545 Low back pain, unspecified: Secondary | ICD-10-CM

## 2017-07-17 DIAGNOSIS — R410 Disorientation, unspecified: Secondary | ICD-10-CM | POA: Diagnosis not present

## 2017-07-17 DIAGNOSIS — I2510 Atherosclerotic heart disease of native coronary artery without angina pectoris: Secondary | ICD-10-CM | POA: Insufficient documentation

## 2017-07-17 DIAGNOSIS — R319 Hematuria, unspecified: Secondary | ICD-10-CM | POA: Diagnosis not present

## 2017-07-17 LAB — CBC WITH DIFFERENTIAL/PLATELET
Basophils Absolute: 0 10*3/uL (ref 0–0.1)
Basophils Relative: 0 %
EOS ABS: 0.1 10*3/uL (ref 0–0.7)
Eosinophils Relative: 1 %
HCT: 39.1 % — ABNORMAL LOW (ref 40.0–52.0)
Hemoglobin: 13 g/dL (ref 13.0–18.0)
LYMPHS ABS: 1.8 10*3/uL (ref 1.0–3.6)
Lymphocytes Relative: 15 %
MCH: 31.4 pg (ref 26.0–34.0)
MCHC: 33.3 g/dL (ref 32.0–36.0)
MCV: 94.2 fL (ref 80.0–100.0)
MONOS PCT: 8 %
Monocytes Absolute: 1 10*3/uL (ref 0.2–1.0)
Neutro Abs: 9.2 10*3/uL — ABNORMAL HIGH (ref 1.4–6.5)
Neutrophils Relative %: 76 %
PLATELETS: 158 10*3/uL (ref 150–440)
RBC: 4.16 MIL/uL — ABNORMAL LOW (ref 4.40–5.90)
RDW: 14.2 % (ref 11.5–14.5)
WBC: 12.1 10*3/uL — ABNORMAL HIGH (ref 3.8–10.6)

## 2017-07-17 LAB — URINALYSIS, COMPLETE (UACMP) WITH MICROSCOPIC
Bilirubin Urine: NEGATIVE
GLUCOSE, UA: NEGATIVE mg/dL
HGB URINE DIPSTICK: NEGATIVE
Ketones, ur: NEGATIVE mg/dL
NITRITE: POSITIVE — AB
PROTEIN: 100 mg/dL — AB
Specific Gravity, Urine: 1.021 (ref 1.005–1.030)
pH: 6 (ref 5.0–8.0)

## 2017-07-17 LAB — COMPREHENSIVE METABOLIC PANEL
ALK PHOS: 73 U/L (ref 38–126)
ALT: 35 U/L (ref 17–63)
ANION GAP: 8 (ref 5–15)
AST: 31 U/L (ref 15–41)
Albumin: 3.7 g/dL (ref 3.5–5.0)
BUN: 18 mg/dL (ref 6–20)
CALCIUM: 9.5 mg/dL (ref 8.9–10.3)
CHLORIDE: 104 mmol/L (ref 101–111)
CO2: 30 mmol/L (ref 22–32)
Creatinine, Ser: 1.68 mg/dL — ABNORMAL HIGH (ref 0.61–1.24)
GFR calc non Af Amer: 41 mL/min — ABNORMAL LOW (ref >60)
GFR, EST AFRICAN AMERICAN: 48 mL/min — AB (ref >60)
Glucose, Bld: 187 mg/dL — ABNORMAL HIGH (ref 65–99)
Potassium: 3.6 mmol/L (ref 3.5–5.1)
SODIUM: 142 mmol/L (ref 135–145)
Total Bilirubin: 1.1 mg/dL (ref 0.3–1.2)
Total Protein: 7.7 g/dL (ref 6.5–8.1)

## 2017-07-17 MED ORDER — ONDANSETRON HCL 4 MG/2ML IJ SOLN
INTRAMUSCULAR | Status: AC
  Filled 2017-07-17: qty 2

## 2017-07-17 MED ORDER — MORPHINE SULFATE (PF) 4 MG/ML IV SOLN
INTRAVENOUS | Status: AC
  Filled 2017-07-17: qty 1

## 2017-07-17 MED ORDER — MORPHINE SULFATE (PF) 4 MG/ML IV SOLN
4.0000 mg | Freq: Once | INTRAVENOUS | Status: AC
  Administered 2017-07-17: 4 mg via INTRAVENOUS

## 2017-07-17 MED ORDER — ONDANSETRON HCL 4 MG/2ML IJ SOLN
4.0000 mg | Freq: Once | INTRAMUSCULAR | Status: AC
  Administered 2017-07-17: 4 mg via INTRAVENOUS

## 2017-07-17 MED ORDER — SODIUM CHLORIDE 0.9 % IV SOLN
1.0000 g | Freq: Once | INTRAVENOUS | Status: AC
  Administered 2017-07-17: 1 g via INTRAVENOUS
  Filled 2017-07-17: qty 10

## 2017-07-17 MED ORDER — CEPHALEXIN 500 MG PO CAPS: 500.0000 mg | ORAL_CAPSULE | Freq: Three times a day (TID) | ORAL | 0 refills | Status: DC

## 2017-07-17 MED ORDER — SODIUM CHLORIDE 0.9 % IV BOLUS (SEPSIS)
1000.0000 mL | Freq: Once | INTRAVENOUS | Status: AC
  Administered 2017-07-17: 1000 mL via INTRAVENOUS

## 2017-07-17 NOTE — ED Provider Notes (Signed)
[facility] Emergency Department Provider Note  ____________________________________________   First MD Initiated Contact with Patient 07/17/17 1329     (approximate)  I have reviewed the triage vital signs and the nursing notes.   HISTORY  Chief Complaint Back Pain    HPI Jared Walker is a 67 y.o. male who presents to the emergency department complaining of low back pain.  He has a chronic history of low back pain.  He states he did slide off the edge of the bed and landed on his back.  He states it is become more difficult to walk and he is in more pain.  He did have surgery on his left shoulder recently.  He is unable to move the shoulder due to the shoulder immobilizer.  His wife states he has had some confusion since his surgery.  Says been going on over the past 6 months.  She states he cannot remember when she is given him the pain medication.  She states she has noticed some changes in his memory.  Past Medical History:  Diagnosis Date  . Anemia   . Aortic stenosis, mild    mild AS by echo, 08/2011  . Cervical disc herniation   . CHF (congestive heart failure) ([place])   . Chronic kidney disease    kidney stones. chronic kidney disease, stage III. followed by doctor in [place]  . Cirrhosis ([place])    due to hepatitis c not being treated for a long periiod of time  . Coronary artery disease   . Depression   . Diabetes mellitus without complication ([place]) [id number]/8101   A1C 10.0  . Full dentures    upper and lower  . GERD (gastroesophageal reflux disease)   . Gout   . Heart murmur    no treatment  . Hep C w/o coma, chronic ([place]) 01/19/2015   took treatment and it is now gone.   . Hepatitis C 09/25/11  . HIV positive ([place]) 09/25/2011   nondetectable.   . Hypertension   . MI (myocardial infarction) ([place]) 2017   NSTEMI  . Neuromuscular disorder (HCC)    numbness in fingers most likely due to shoulder damage.    Patient Active Problem List    Diagnosis Date Noted  . Acute encephalopathy 11/30/2016  . Dyspnea 03/28/2016  . Elevated troponin 03/28/2016  . Acute on chronic systolic CHF (congestive heart failure) ([place]) 03/28/2016  . Swelling of lower extremity 03/28/2016  . HIV disease ([place]) 03/28/2016  . Acute on chronic diastolic CHF (congestive heart failure) ([place])   . Cirrhosis of liver without ascites ([place])   . Chronic renal failure in pediatric patient, stage 3 (moderate) ([place])   . BP (high blood pressure) 12/11/2015  . Acute non-ST elevation myocardial infarction (NSTEMI) ([place]) 11/13/2015  . Chest pain 11/09/2015  . Bradycardia 11/09/2015  . Abnormal EKG 11/09/2015  . Iron deficiency anemia 08/27/2015  . Musculoskeletal chest pain 01/19/2015  . Anemia 01/19/2015  . GI bleed 01/19/2015  . Pica in adults 01/19/2015  . CKD (chronic kidney disease), stage III ([place]) 01/19/2015  . HIV (human immunodeficiency virus infection) ([place]) 01/19/2015  . HTN (hypertension) 01/19/2015  . Chronic hepatitis C without hepatic coma ([place]) 01/19/2015  . CKD (chronic kidney disease) 01/19/2015  . History of arthroscopy of shoulder 11/13/2014    Past Surgical History:  Procedure Laterality Date  . BACK SURGERY  1985   lumbar laminectomy. metal in pelvis  . bulging disc Bilateral 1990  L4-L5  . CARDIOVASCULAR STRESS TEST     at [facility]  . CHOLECYSTECTOMY    . CLOSED REDUCTION PELVIC FRACTURE    . ESOPHAGOGASTRODUODENOSCOPY (EGD) WITH PROPOFOL N/A 01/21/2015   Procedure: ESOPHAGOGASTRODUODENOSCOPY (EGD) WITH PROPOFOL;  Surgeon: Manya Silvas, MD;  Location[facility] END[facility]COPY;  Service: Endoscopy;  Laterality: N/A;  . ESOPHAGOGASTRODUODENOSCOPY (EGD) WITH PROPOFOL N/A 09/04/2015   Procedure: ESOPHAGOGASTRODUODENOSCOPY (EGD) WITH PROPOFOL;  Surgeon: Manya Silvas, MD;  Location[facility] END[facility]COPY;  Service: Endoscopy;  Laterality: N/A;  . FRACTURE SURGERY Right    hand.  pins in hand from long ago  . KNEE ARTHROSCOPY  2009   Right  .  LUMBAR LAMINECTOMY/DECOMPRESSION MICRODISCECTOMY  09/25/2011   Procedure: LUMBAR LAMINECTOMY/DECOMPRESSION MICRODISCECTOMY;  Surgeon: Erline Levine, MD;  Location: [place] NEURO ORS;  Service: Neurosurgery;  Laterality: Left;  Left Lumbar Four-Five Microdiskectomy  . NEPHROSTOMY     tube Left  . SHOULDER ARTHROSCOPY WITH OPEN ROTATOR CUFF REPAIR Right 10/12/2014   Procedure: SHOULDER ARTHROSCOP with decompression;  Surgeon: Leanor Kail, MD;  Location: [place];  Service: Orthopedics;  Laterality: Right;  . SHOULDER ARTHROSCOPY WITH OPEN ROTATOR CUFF REPAIR Left 07/08/2017   Procedure: SHOULDER ARTHROSCOPY WITH OPEN ROTATOR CUFF REPAIR;  Surgeon: Corky Mull, MD;  Location: [facility] ORS;  Service: Orthopedics;  Laterality: Left;    Prior to Admission medications   Medication Sig Start Date End Date Taking? Authorizing Provider  allopurinol (ZYLOPRIM) 100 MG tablet Take 250 mg by mouth daily.     [provider]  amLODipine (NORVASC) 5 MG tablet Take 5 mg by mouth daily.  11/13/15 07/01/17  [provider]  aspirin EC 81 MG EC tablet Take 1 tablet (81 mg total) by mouth daily. 11/11/15   Bettey Costa, MD  atorvastatin (LIPITOR) 80 MG tablet Take 80 mg by mouth daily. Take in the morning. 11/13/15 07/01/17  [provider]  carvedilol (COREG) 12.5 MG tablet Take 12.5 mg by mouth 2 (two) times daily with a meal.    [provider]  cephALEXin (KEFLEX) 500 MG capsule Take 1 capsule (500 mg total) by mouth 3 (three) times daily. 07/17/17   Fisher, Linden Dolin, PA-C  Cholecalciferol (VITAMIN D-1000 MAX ST) 1000 units tablet Take 1,000 Units by mouth daily.    [provider]  cloNIDine (CATAPRES) 0.1 MG tablet Take 0.1 mg by mouth 3 (three) times daily.    [provider]  clopidogrel (PLAVIX) 75 MG tablet Take 75 mg by mouth daily.    [provider]  Cranberry 400 MG CAPS Take 400 mg by mouth daily.    [provider]  DESCOVY 200-25 MG  tablet Take 1 tablet by mouth at bedtime.  08/12/15   [provider]  diazepam (VALIUM) 5 MG tablet Take 5 mg by mouth at bedtime.     [provider]  diclofenac sodium (VOLTAREN) 1 % GEL Apply 2 g topically 4 (four) times daily. 06/21/17   Johnn Hai, PA-C  dolutegravir (TIVICAY) 50 MG tablet Take 50 mg by mouth at bedtime.  07/31/15   [provider]  ferrous sulfate 325 (65 FE) MG tablet Take 325 mg by mouth daily with breakfast.  01/10/15 07/01/17  [provider]  folic acid (FOLVITE) 1 MG tablet Take 1 mg by mouth daily. In the morning    [provider]  glimepiride (AMARYL) 2 MG tablet Take 2 mg by mouth daily with breakfast.    [provider]  isosorbide mononitrate (IMDUR) 30 MG 24 hr tablet Take 1 tablet (30 mg total) by mouth daily. 03/29/16   Theodoro Grist, MD  losartan (COZAAR) 100 MG tablet Take 100 mg by mouth every morning.  10/31/15   [provider]  omeprazole (PRILOSEC) 40 MG capsule Take 40 mg by mouth every morning.  11/06/15   [provider]  oxyCODONE (ROXICODONE) 5 MG immediate release tablet Take 1-2 tablets (5-10 mg total) by mouth every 4 (four) hours as needed. 07/08/17   Poggi, Marshall Cork, MD  sertraline (ZOLOFT) 50 MG tablet Take 50 mg by mouth every evening.     [provider]  torsemide (DEMADEX) 20 MG tablet Take 1 tablet (20 mg total) by mouth daily. 03/28/16   Theodoro Grist, MD  vitamin B-12 (CYANOCOBALAMIN) 1000 MCG tablet Take 1,000 mcg by mouth daily.    [provider]    Allergies Oxycodone-acetaminophen; Buchu-cornsilk-ch grass-hydran; Colchicine; Nifedipine; and Tramadol  Family History  Problem Relation Age of Onset  . Hypertension Mother   . Cancer Father   . Heart disease Sister   . Diabetes Brother   . Anesthesia problems Neg Hx     Social History Social History   Tobacco Use  . Smoking status: Former Smoker    Packs/day: 0.50    Types: Cigarettes      Last attempt to quit: 06/1978    Years since quitting: 39.1  . Smokeless tobacco: Former Systems developer    Quit date: 03/13/1972  Substance Use Topics  . Alcohol use: No  . Drug use: No    Review of Systems  Constitutional: No fever/chills, positive for some confusion Eyes: No visual changes. ENT: No sore throat. Respiratory: Denies cough Genitourinary: Negative for dysuria. Musculoskeletal: Positive for back pain. Skin: Negative for rash.    ____________________________________________   PHYSICAL EXAM:  VITAL SIGNS: ED Triage Vitals  Enc Vitals Group     BP 07/17/17 1215 (!) 161/98     Pulse Rate 07/17/17 1215 92     Resp 07/17/17 1215 20     Temp 07/17/17 1215 99 F (37.2 C)     Temp Source 07/17/17 1215 Oral     SpO2 07/17/17 1215 96 %     Weight 07/17/17 1219 222 lb (100.7 kg)     Height 07/17/17 1219 '5\' 11"'  (1.803 m)     Head Circumference --      Peak Flow --      Pain Score 07/17/17 1218 10     Pain Loc --      Pain Edu? --      Excl. in [place]? --     Constitutional: Alert and oriented. Well appearing and in no acute distress. Eyes: Conjunctivae are normal.  Head: Atraumatic. Nose: No congestion/rhinnorhea. Mouth/Throat: Mucous membranes are moist.   Cardiovascular: Normal rate, regular rhythm.  Heart sounds are normal Respiratory: Normal respiratory effort.  No retractions, lungs clear to auscultation GU: deferred Musculoskeletal: The left shoulder is in a shoulder immobilizer.  The lumbar spine is tender to palpation.  Distal neurovascular is intact.  Neurologic:  Normal speech and language.  Skin:  Skin is warm, dry and intact. No rash noted. Psychiatric: Mood and affect are normal. Speech and behavior are normal.  Patient is able to answer questions in an appropriate manner for me.  ____________________________________________   LABS (all labs ordered are listed, but only abnormal results are displayed)  Labs Reviewed  CBC WITH DIFFERENTIAL/PLATELET -  Abnormal; Notable for  the following components:      Result Value   WBC 12.1 (*)    RBC 4.16 (*)    HCT 39.1 (*)    Neutro Abs 9.2 (*)    All other components within normal limits  COMPREHENSIVE METABOLIC PANEL - Abnormal; Notable for the following components:   Glucose, Bld 187 (*)    Creatinine, Ser 1.68 (*)    GFR calc non Af Amer 41 (*)    GFR calc Af Amer 48 (*)    All other components within normal limits  URINALYSIS, COMPLETE (UACMP) WITH MICROSCOPIC - Abnormal; Notable for the following components:   Color, Urine AMBER (*)    APPearance TURBID (*)    Protein, ur 100 (*)    Nitrite POSITIVE (*)    Leukocytes, UA LARGE (*)    Bacteria, UA MANY (*)    Squamous Epithelial / LPF 0-5 (*)    All other components within normal limits   ____________________________________________   ____________________________________________  RADIOLOGY  CT lumbar spine is negative for acute abnormality CT of the head is negative for acute abnormality  ____________________________________________   PROCEDURES  Procedure(s) performed: No  Procedures    ____________________________________________   INITIAL IMPRESSION / ASSESSMENT AND PLAN / ED COURSE  Pertinent labs & imaging results that were available during my care of the patient were reviewed by me and considered in my medical decision making (see chart for details).  Patient 67 year old male complaining of chronic low back pain with a new injury when he slid off the edge of the bed.  His wife is concerned about his memory.  She states he has difficulty remembering when he is at his medication etc.  On physical exam he appears well.  He is able to answer all questions appropriately.  The left shoulder is in an immobilizer.  Lumbar spine is minimally tender  CT lumbar spine, CT of the head are ordered    ----------------------------------------- 4:03 PM on 07/17/2017 -----------------------------------------  When I  went to discuss the CT of the head and the lumbar spine results with family, which are both negative, though wife is concerned about the confusion.  She is also concerned about the decreased urination.  She states she is only urinated a small amount that is in the urinal since this morning.  Patient states he has not taken his fluid pill.  She is concerned that he has had kidney failure due to the surgery.  Saline locks ordered normal saline bolus, UA, CBC, and met C.  They are requesting Toradol and his pain medication.  Explained to them I would need to get the renal functions back prior to given Toradol.  ----------------------------------------- 5:40 PM on 07/17/2017 ----------------------------------------- UA is amber in color turbid, 100 protein, positive nitrites positive and large leukocytes; white count is 12.1, glucose is 187 creatinine is 1.68.  IV Rocephin 1 g, normal saline bolus of 1 L, 4 mg of morphine IV  The creatinine is actually improved from July, July 2018 showed a creatinine of 2.54   ----------------------------------------- 5:58 PM on 07/17/2017 -----------------------------------------  Discussed the patient's results and status with Dr. Quentin Cornwall.  He is comfortable with the treatment plan of fluids and Rocephin.  He feels the same as I do that he will be okay to be discharged home.  He will be discharged with strict instructions to return to the emergency department if worsening.  All test results and treatment plan were discussed with patient and his wife.  He is to take the antibiotic as prescribed.  He is to have a recheck with his regular doctor in 2-3 days.  He will need to call for an appointment.  If he is worsening he should return to the emergency department.  The patient states he understands will comply with recommendations.  He was discharged in stable condition  As part of my medical decision making, I reviewed the following data within the electronic medical  record:  History obtained from family, Nursing notes reviewed and incorporated, Labs reviewed CBC with elevated white count, UA with positive nitrites and large amount of leuks, the met C was basically normal, his kidney functions have improved from previous baseline kidney functions., Radiograph reviewed CT of the lumbar spine shows no acute abnormality, CT of the head shows no acute abnormality, Notes from prior ED visits and [place] Controlled Substance Database  ____________________________________________   FINAL CLINICAL IMPRESSION(S) / ED DIAGNOSES  Final diagnoses:  Confusion  Urinary tract infection with hematuria, site unspecified      NEW MEDICATIONS STARTED DURING THIS VISIT:  New Prescriptions   CEPHALEXIN (KEFLEX) 500 MG CAPSULE    Take 1 capsule (500 mg total) by mouth 3 (three) times daily.     Note:  This document was prepared using Dragon voice recognition software and may include unintentional dictation errors.    Versie Starks, PA-C 07/17/17 Paulo Fruit, MD 07/18/17 0700

## 2017-07-17 NOTE — ED Notes (Signed)

## 2017-07-17 NOTE — ED Notes (Signed)
Pt to Xray at this time

## 2017-07-17 NOTE — ED Triage Notes (Signed)
Low back pain radiating to both legs since yesterday.

## 2017-07-17 NOTE — ED Notes (Signed)
Pt reports that he has L3-L5 back problems.  Pt reports that he has had more difficulty walking today and is in more pain.  Pt reports he had recent surgery today.  Pt is A&Ox4, in NAD.

## 2017-07-17 NOTE — Discharge Instructions (Signed)
Follow-up with your regular doctor for recheck in 3 days.  Call for an appointment.  Please take your antibiotic as prescribed.  He was given your first dose tonight will not need additional antibiotics tonight.  Continue your pain medications as prescribed by your doctor.

## 2017-07-19 MED ORDER — ATORVASTATIN 20 MG TABLET
ORAL_TABLET | ORAL | 99 refills | 0.00000 days | Status: CP
Start: 2017-07-19 — End: 2017-07-19

## 2017-07-19 MED ORDER — ATORVASTATIN 20 MG TABLET: tablet | 99 refills | 0 days

## 2017-07-28 MED ORDER — SPIRONOLACTONE 25 MG TABLET
ORAL_TABLET | Freq: Every day | ORAL | 0 refills | 0.00000 days | Status: CP
Start: 2017-07-28 — End: 2018-10-18

## 2017-07-28 NOTE — Unmapped (Signed)
Reviewed Dr. Debria Garret instructions  to the patient after his 04/13/17 visit. He was to take Spironolactone 25mg  once daily   .  Per the note: Spironolactone 25mg  , take one daily, disp 30 with no refills was authorized. The patient has a follow up appointment in March.

## 2017-08-02 ENCOUNTER — Inpatient Hospital Stay: Payer: Medicare Other

## 2017-08-02 ENCOUNTER — Inpatient Hospital Stay: Payer: Medicare Other | Attending: Internal Medicine | Admitting: Internal Medicine

## 2017-08-02 ENCOUNTER — Other Ambulatory Visit: Payer: Self-pay

## 2017-08-02 ENCOUNTER — Encounter: Payer: Self-pay | Admitting: Internal Medicine

## 2017-08-02 VITALS — BP 158/108 | HR 80 | Temp 97.9°F | Resp 20 | Ht 71.0 in | Wt 223.0 lb

## 2017-08-02 DIAGNOSIS — E1122 Type 2 diabetes mellitus with diabetic chronic kidney disease: Secondary | ICD-10-CM

## 2017-08-02 DIAGNOSIS — Z87891 Personal history of nicotine dependence: Secondary | ICD-10-CM

## 2017-08-02 DIAGNOSIS — B182 Chronic viral hepatitis C: Secondary | ICD-10-CM | POA: Insufficient documentation

## 2017-08-02 DIAGNOSIS — I2510 Atherosclerotic heart disease of native coronary artery without angina pectoris: Secondary | ICD-10-CM

## 2017-08-02 DIAGNOSIS — F329 Major depressive disorder, single episode, unspecified: Secondary | ICD-10-CM

## 2017-08-02 DIAGNOSIS — I350 Nonrheumatic aortic (valve) stenosis: Secondary | ICD-10-CM | POA: Insufficient documentation

## 2017-08-02 DIAGNOSIS — D509 Iron deficiency anemia, unspecified: Secondary | ICD-10-CM | POA: Insufficient documentation

## 2017-08-02 DIAGNOSIS — N183 Chronic kidney disease, stage 3 unspecified: Secondary | ICD-10-CM

## 2017-08-02 DIAGNOSIS — K219 Gastro-esophageal reflux disease without esophagitis: Secondary | ICD-10-CM | POA: Diagnosis not present

## 2017-08-02 DIAGNOSIS — I509 Heart failure, unspecified: Secondary | ICD-10-CM | POA: Diagnosis not present

## 2017-08-02 DIAGNOSIS — K7460 Unspecified cirrhosis of liver: Secondary | ICD-10-CM | POA: Diagnosis not present

## 2017-08-02 DIAGNOSIS — Z7982 Long term (current) use of aspirin: Secondary | ICD-10-CM | POA: Insufficient documentation

## 2017-08-02 DIAGNOSIS — Z7984 Long term (current) use of oral hypoglycemic drugs: Secondary | ICD-10-CM | POA: Insufficient documentation

## 2017-08-02 DIAGNOSIS — I129 Hypertensive chronic kidney disease with stage 1 through stage 4 chronic kidney disease, or unspecified chronic kidney disease: Secondary | ICD-10-CM | POA: Diagnosis not present

## 2017-08-02 DIAGNOSIS — R011 Cardiac murmur, unspecified: Secondary | ICD-10-CM | POA: Insufficient documentation

## 2017-08-02 DIAGNOSIS — B20 Human immunodeficiency virus [HIV] disease: Secondary | ICD-10-CM | POA: Diagnosis not present

## 2017-08-02 DIAGNOSIS — I252 Old myocardial infarction: Secondary | ICD-10-CM | POA: Diagnosis not present

## 2017-08-02 DIAGNOSIS — M109 Gout, unspecified: Secondary | ICD-10-CM | POA: Diagnosis not present

## 2017-08-02 DIAGNOSIS — Z79899 Other long term (current) drug therapy: Secondary | ICD-10-CM

## 2017-08-02 LAB — CBC WITH DIFFERENTIAL/PLATELET
BASOS ABS: 0.1 10*3/uL (ref 0–0.1)
Basophils Relative: 1 %
EOS PCT: 1 %
Eosinophils Absolute: 0.1 10*3/uL (ref 0–0.7)
HEMATOCRIT: 41.6 % (ref 40.0–52.0)
Hemoglobin: 13.9 g/dL (ref 13.0–18.0)
LYMPHS ABS: 2.1 10*3/uL (ref 1.0–3.6)
LYMPHS PCT: 20 %
MCH: 31.1 pg (ref 26.0–34.0)
MCHC: 33.4 g/dL (ref 32.0–36.0)
MCV: 93.1 fL (ref 80.0–100.0)
MONO ABS: 0.8 10*3/uL (ref 0.2–1.0)
MONOS PCT: 7 %
NEUTROS ABS: 7.5 10*3/uL — AB (ref 1.4–6.5)
Neutrophils Relative %: 71 %
PLATELETS: 228 10*3/uL (ref 150–440)
RBC: 4.47 MIL/uL (ref 4.40–5.90)
RDW: 13.3 % (ref 11.5–14.5)
WBC: 10.6 10*3/uL (ref 3.8–10.6)

## 2017-08-02 LAB — FERRITIN: FERRITIN: 42 ng/mL (ref 24–336)

## 2017-08-02 LAB — IRON AND TIBC
Iron: 36 ug/dL — ABNORMAL LOW (ref 45–182)
Saturation Ratios: 9 % — ABNORMAL LOW (ref 17.9–39.5)
TIBC: 397 ug/dL (ref 250–450)
UIBC: 361 ug/dL

## 2017-08-02 LAB — LACTATE DEHYDROGENASE: LDH: 200 U/L — ABNORMAL HIGH (ref 98–192)

## 2017-08-02 NOTE — Assessment & Plan Note (Signed)
#   Severe iron deficiency anemia/hemoglobin- 6/ ferritin 5 [march 2017]. Status post IV iron- significant improvement of hemoglobin.  #Most recent hemoglobin July 22, 1911; however patient complains of significant pica/fatigue/cold intolerance-check labs today iron studies ferritin; thyroid profile.  Recommend restarting iron pills twice a day.  # CKD stage III-recommend myeloma workup.  #Recommend follow-up in 2 months labs and studies; will call patient if labs from today significantly abnormal or if need IV iron. Will call - [phone]/ cell.

## 2017-08-02 NOTE — Progress Notes (Signed)
Patient here for routine followup per patient's request. Pt c/o "craving for ice chips." c/o fatigue. "I believe my iron stores may be low."

## 2017-08-02 NOTE — Progress Notes (Signed)
[place] NOTE  Patient Care Team: Tracie Harrier, MD as PCP - General (Internal Medicine)  CHIEF COMPLAINTS/PURPOSE OF CONSULTATION:   # SEVERE IRON DEFICIENCY ANEMIA- [EGD Aug 2016-esophageal ulcer; colonoscopy-? 2001]; EGD- April 2017- Neg. UA- neg.   # HIV- Dr.Idarmore; [facility]/Hepatitis C [no treatment]  # CKD creat- 1.6- 2.0   HISTORY OF PRESENTING ILLNESS:  Jared Walker 67 y.o.  male history of chronic kidney disease/ and the severe iron deficiency is here for follow-up.  Patient in the interim fell hurt his left shoulder; recent surgery.  Patient denies any blood in stools black stools.  He complains of worsening fatigue also complains of pica; and also cold intolerance.  No shortness of breath. no vomiting no weight loss. No blood in urine.    ROS: A complete 10 point review of system is done which is negative except mentioned above in history of present illness  MEDICAL HISTORY:  Past Medical History:  Diagnosis Date  . Anemia   . Aortic stenosis, mild    mild AS by echo, 08/2011  . Cervical disc herniation   . CHF (congestive heart failure) ([place])   . Chronic kidney disease    kidney stones. chronic kidney disease, stage III. followed by doctor in [place]  . Cirrhosis ([place])    due to hepatitis c not being treated for a long periiod of time  . Coronary artery disease   . Depression   . Diabetes mellitus without complication ([place]) [id number]   A1C 10.0  . Full dentures    upper and lower  . GERD (gastroesophageal reflux disease)   . Gout   . Heart murmur    no treatment  . Hep C w/o coma, chronic ([place]) 01/19/2015   took treatment and it is now gone.   . Hepatitis C 09/25/11  . HIV positive ([place]) 09/25/2011   nondetectable.   . Hypertension   . MI (myocardial infarction) ([place]) 2017   NSTEMI  . Neuromuscular disorder (HCC)    numbness in fingers most likely due to shoulder damage.    SURGICAL HISTORY: Past Surgical History:   Procedure Laterality Date  . BACK SURGERY  1985   lumbar laminectomy. metal in pelvis  . bulging disc Bilateral 1990   L4-L5  . CARDIOVASCULAR STRESS TEST     at [facility]  . CHOLECYSTECTOMY    . CLOSED REDUCTION PELVIC FRACTURE    . ESOPHAGOGASTRODUODENOSCOPY (EGD) WITH PROPOFOL N/A 01/21/2015   Procedure: ESOPHAGOGASTRODUODENOSCOPY (EGD) WITH PROPOFOL;  Surgeon: Manya Silvas, MD;  Location: [facility] ENDOSCOPY;  Service: Endoscopy;  Laterality: N/A;  . ESOPHAGOGASTRODUODENOSCOPY (EGD) WITH PROPOFOL N/A 09/04/2015   Procedure: ESOPHAGOGASTRODUODENOSCOPY (EGD) WITH PROPOFOL;  Surgeon: Manya Silvas, MD;  Location: [facility] ENDOSCOPY;  Service: Endoscopy;  Laterality: N/A;  . FRACTURE SURGERY Right    hand.  pins in hand from long ago  . KNEE ARTHROSCOPY  2009   Right  . LUMBAR LAMINECTOMY/DECOMPRESSION MICRODISCECTOMY  09/25/2011   Procedure: LUMBAR LAMINECTOMY/DECOMPRESSION MICRODISCECTOMY;  Surgeon: Erline Levine, MD;  Location: [place] NEURO ORS;  Service: Neurosurgery;  Laterality: Left;  Left Lumbar Four-Five Microdiskectomy  . NEPHROSTOMY     tube Left  . SHOULDER ARTHROSCOPY WITH OPEN ROTATOR CUFF REPAIR Right 10/12/2014   Procedure: SHOULDER ARTHROSCOP with decompression;  Surgeon: Leanor Kail, MD;  Location: [place];  Service: Orthopedics;  Laterality: Right;  . SHOULDER ARTHROSCOPY WITH OPEN ROTATOR CUFF REPAIR Left 07/08/2017   Procedure: SHOULDER ARTHROSCOPY WITH OPEN ROTATOR  CUFF REPAIR;  Surgeon: Corky Mull, MD;  Location: [facility] ORS;  Service: Orthopedics;  Laterality: Left;    SOCIAL HISTORY: Social History   Socioeconomic History  . Marital status: Married    Spouse name: Not on file  . Number of children: Not on file  . Years of education: Not on file  . Highest education level: Not on file  Social Needs  . Financial resource strain: Not on file  . Food insecurity - worry: Not on file  . Food insecurity - inability: Not on file  . Transportation needs -  medical: Not on file  . Transportation needs - non-medical: Not on file  Occupational History  . Not on file  Tobacco Use  . Smoking status: Former Smoker    Packs/day: 0.50    Types: Cigarettes    Last attempt to quit: 06/1978    Years since quitting: 39.1  . Smokeless tobacco: Former Systems developer    Quit date: 03/13/1972  Substance and Sexual Activity  . Alcohol use: No  . Drug use: No  . Sexual activity: Not on file  Other Topics Concern  . Not on file  Social History Narrative   Independent at baseline. Ambulates with a cane. Lives at home with his wife    FAMILY HISTORY: Family History  Problem Relation Age of Onset  . Hypertension Mother   . Cancer Father   . Heart disease Sister   . Diabetes Brother   . Anesthesia problems Neg Hx     ALLERGIES:  is allergic to oxycodone-acetaminophen; buchu-cornsilk-ch grass-hydran; colchicine; nifedipine; and tramadol.  MEDICATIONS:  Current Outpatient Medications  Medication Sig Dispense Refill  . albuterol (PROVENTIL HFA;VENTOLIN HFA) 108 (90 Base) MCG/ACT inhaler Inhale 1-2 puffs into the lungs every 6 (six) hours as needed for wheezing or shortness of breath.     . allopurinol (ZYLOPRIM) 100 MG tablet Take 250 mg by mouth daily.     Marland Kitchen amLODipine (NORVASC) 5 MG tablet Take 5 mg by mouth daily.     Marland Kitchen aspirin EC 81 MG EC tablet Take 1 tablet (81 mg total) by mouth daily. 120 tablet 0  . atorvastatin (LIPITOR) 80 MG tablet Take 80 mg by mouth daily. Take in the morning.    . carvedilol (COREG) 12.5 MG tablet Take 12.5 mg by mouth 2 (two) times daily with a meal.    . cephALEXin (KEFLEX) 500 MG capsule Take 1 capsule (500 mg total) by mouth 3 (three) times daily. 21 capsule 0  . Cholecalciferol (VITAMIN D-1000 MAX ST) 1000 units tablet Take 1,000 Units by mouth daily.    . cloNIDine (CATAPRES) 0.1 MG tablet Take 0.1 mg by mouth 3 (three) times daily.    . clopidogrel (PLAVIX) 75 MG tablet Take 75 mg by mouth daily.    . Cranberry 400 MG  CAPS Take 400 mg by mouth daily.    . DESCOVY 200-25 MG tablet Take 1 tablet by mouth at bedtime.   3  . diazepam (VALIUM) 5 MG tablet Take 5 mg by mouth at bedtime.     . dolutegravir (TIVICAY) 50 MG tablet Take 50 mg by mouth at bedtime.     . ferrous sulfate 325 (65 FE) MG tablet Take 325 mg by mouth daily with breakfast.     . folic acid (FOLVITE) 1 MG tablet Take 1 mg by mouth daily. In the morning    . glimepiride (AMARYL) 2 MG tablet Take 2 mg by mouth daily with  breakfast.    . HYDROcodone-acetaminophen (NORCO/VICODIN) 5-325 MG tablet Directions are take one tablet 30 minutes before physical therapy, then take one tablet at bedtime as needed for pain    . isosorbide mononitrate (IMDUR) 30 MG 24 hr tablet Take 1 tablet (30 mg total) by mouth daily. 30 tablet 5  . lidocaine (XYLOCAINE) 5 % ointment Apply 1 application topically daily as needed for mild pain.     Marland Kitchen losartan (COZAAR) 100 MG tablet Take 100 mg by mouth every morning.   0  . nystatin (MYCOSTATIN) 100000 UNIT/ML suspension Take 5 mLs by mouth 4 (four) times daily as needed for mouth pain.    . Omega-3 Fatty Acids (FISH OIL PO) Take by mouth.    Marland Kitchen omeprazole (PRILOSEC) 40 MG capsule Take 40 mg by mouth every morning.   0  . ondansetron (ZOFRAN) 4 MG tablet TAKE 1 TABLET BY MOUTH EVERY 8 HOURS IF NEEDED FOR NAUSEA  0  . sertraline (ZOLOFT) 50 MG tablet Take 50 mg by mouth every evening.     . torsemide (DEMADEX) 20 MG tablet Take 1 tablet (20 mg total) by mouth daily. 30 tablet 5   No current facility-administered medications for this visit.       Marland Kitchen  PHYSICAL EXAMINATION:   Vitals:   08/02/17 1125 08/02/17 1130  BP: (!) 165/125 (!) 158/108  Pulse: 80   Resp: 20   Temp: 97.9 F (36.6 C)    Filed Weights   08/02/17 1125  Weight: 223 lb (101.2 kg)    GENERAL: Well-nourished well-developed; Alert, no distress and comfortable.  Accompanied by his wife. EYES: positive for pallor; no icterus OROPHARYNX: no thrush or  ulceration; good dentition  NECK: supple, no masses felt LYMPH:  no palpable lymphadenopathy in the cervical, axillary or inguinal regions LUNGS: clear to auscultation and  No wheeze or crackles HEART/CVS: regular rate & rhythm and no murmurs; No lower extremity edema ABDOMEN: abdomen soft, non-tender and normal bowel sounds Musculoskeletal:no cyanosis of digits and no clubbing  PSYCH: alert & oriented x 3 with fluent speech NEURO: no focal motor/sensory deficits SKIN:  no rashes or significant lesions  LABORATORY DATA:  I have reviewed the data as listed Lab Results  Component Value Date   WBC 10.6 08/02/2017   HGB 13.9 08/02/2017   HCT 41.6 08/02/2017   MCV 93.1 08/02/2017   PLT 228 08/02/2017   Recent Labs    08/12/16 1542 11/30/16 1627 12/01/16 0401 07/05/17 1150 07/17/17 1657  NA 139 139 139 140 142  K 4.7 4.4 4.0 4.4 3.6  CL 104 106 108 107 104  CO2 28 23 26 25 30   GLUCOSE 145* 132* 126* 213* 187*  BUN 43* 32* 32* 20 18  CREATININE 2.68* 2.95* 2.83* 2.02* 1.68*  CALCIUM 9.2 10.1 9.3 9.8 9.5  GFRNONAA 24* 21* 22* 33* 41*  GFRAA 27* 24* 26* 38* 48*  PROT 8.0 8.7*  --   --  7.7  ALBUMIN 4.1 4.3  --   --  3.7  AST 63* 34  --   --  31  ALT 73* 34  --   --  35  ALKPHOS 73 57  --   --  73  BILITOT 0.9 1.1  --   --  1.1     ASSESSMENT & PLAN:   CKD (chronic kidney disease), stage III (HCC) # Severe iron deficiency anemia/hemoglobin- 6/ ferritin 5 [march 2017]. Status post IV iron- significant improvement of hemoglobin.  #  Most recent hemoglobin July 22, 1911; however patient complains of significant pica/fatigue/cold intolerance-check labs today iron studies ferritin; thyroid profile.  Recommend restarting iron pills twice a day.  # CKD stage III-recommend myeloma workup.  #Recommend follow-up in 2 months labs and studies; will call patient if labs from today significantly abnormal or if need IV iron. Will call - [phone]/ cell.       Cammie Sickle, MD 08/02/2017 12:48 PM

## 2017-08-03 LAB — KAPPA/LAMBDA LIGHT CHAINS
Kappa free light chain: 57.5 mg/L — ABNORMAL HIGH (ref 3.3–19.4)
Kappa, lambda light chain ratio: 1.74 — ABNORMAL HIGH (ref 0.26–1.65)
LAMDA FREE LIGHT CHAINS: 33.1 mg/L — AB (ref 5.7–26.3)

## 2017-08-03 LAB — THYROID PANEL WITH TSH
Free Thyroxine Index: 1.6 (ref 1.2–4.9)
T3 UPTAKE RATIO: 24 % (ref 24–39)
T4 TOTAL: 6.5 ug/dL (ref 4.5–12.0)
TSH: 0.626 u[IU]/mL (ref 0.450–4.500)

## 2017-08-03 LAB — HAPTOGLOBIN: Haptoglobin: 180 mg/dL (ref 34–200)

## 2017-08-04 LAB — MULTIPLE MYELOMA PANEL, SERUM
ALBUMIN SERPL ELPH-MCNC: 3.8 g/dL (ref 2.9–4.4)
ALBUMIN/GLOB SERPL: 1.1 (ref 0.7–1.7)
ALPHA 1: 0.2 g/dL (ref 0.0–0.4)
Alpha2 Glob SerPl Elph-Mcnc: 1 g/dL (ref 0.4–1.0)
B-GLOBULIN SERPL ELPH-MCNC: 1.4 g/dL — AB (ref 0.7–1.3)
GAMMA GLOB SERPL ELPH-MCNC: 1 g/dL (ref 0.4–1.8)
GLOBULIN, TOTAL: 3.7 g/dL (ref 2.2–3.9)
IgA: 462 mg/dL — ABNORMAL HIGH (ref 61–437)
IgG (Immunoglobin G), Serum: 1197 mg/dL (ref 700–1600)
IgM (Immunoglobulin M), Srm: 70 mg/dL (ref 20–172)
Total Protein ELP: 7.5 g/dL (ref 6.0–8.5)

## 2017-08-08 ENCOUNTER — Emergency Department
Admission: EM | Admit: 2017-08-08 | Discharge: 2017-08-09 | Disposition: A | Discharge status: Home / Self Care | Payer: Medicare Other | Attending: Emergency Medicine | Admitting: Emergency Medicine

## 2017-08-08 DIAGNOSIS — Z87891 Personal history of nicotine dependence: Secondary | ICD-10-CM | POA: Diagnosis not present

## 2017-08-08 DIAGNOSIS — Z79899 Other long term (current) drug therapy: Secondary | ICD-10-CM | POA: Diagnosis not present

## 2017-08-08 DIAGNOSIS — N3000 Acute cystitis without hematuria: Secondary | ICD-10-CM | POA: Diagnosis not present

## 2017-08-08 DIAGNOSIS — Z7982 Long term (current) use of aspirin: Secondary | ICD-10-CM | POA: Diagnosis not present

## 2017-08-08 DIAGNOSIS — Z7984 Long term (current) use of oral hypoglycemic drugs: Secondary | ICD-10-CM | POA: Insufficient documentation

## 2017-08-08 DIAGNOSIS — I5042 Chronic combined systolic (congestive) and diastolic (congestive) heart failure: Secondary | ICD-10-CM | POA: Insufficient documentation

## 2017-08-08 DIAGNOSIS — M5416 Radiculopathy, lumbar region: Secondary | ICD-10-CM | POA: Insufficient documentation

## 2017-08-08 DIAGNOSIS — I130 Hypertensive heart and chronic kidney disease with heart failure and stage 1 through stage 4 chronic kidney disease, or unspecified chronic kidney disease: Secondary | ICD-10-CM | POA: Diagnosis not present

## 2017-08-08 DIAGNOSIS — N183 Chronic kidney disease, stage 3 (moderate): Secondary | ICD-10-CM | POA: Insufficient documentation

## 2017-08-08 DIAGNOSIS — E119 Type 2 diabetes mellitus without complications: Secondary | ICD-10-CM | POA: Insufficient documentation

## 2017-08-08 DIAGNOSIS — I2510 Atherosclerotic heart disease of native coronary artery without angina pectoris: Secondary | ICD-10-CM | POA: Insufficient documentation

## 2017-08-08 DIAGNOSIS — M545 Low back pain: Secondary | ICD-10-CM | POA: Diagnosis present

## 2017-08-08 LAB — URINALYSIS, COMPLETE (UACMP) WITH MICROSCOPIC
BILIRUBIN URINE: NEGATIVE
GLUCOSE, UA: NEGATIVE mg/dL
HGB URINE DIPSTICK: NEGATIVE
Ketones, ur: NEGATIVE mg/dL
NITRITE: NEGATIVE
PH: 5 (ref 5.0–8.0)
Protein, ur: NEGATIVE mg/dL
Specific Gravity, Urine: 1.008 (ref 1.005–1.030)

## 2017-08-08 LAB — GLUCOSE, CAPILLARY: Glucose-Capillary: 124 mg/dL — ABNORMAL HIGH (ref 65–99)

## 2017-08-08 MED ORDER — CEPHALEXIN 500 MG PO CAPS
500.0000 mg | ORAL_CAPSULE | Freq: Once | ORAL | Status: AC
  Administered 2017-08-09: 500 mg via ORAL
  Filled 2017-08-08: qty 1

## 2017-08-08 NOTE — ED Notes (Signed)
See triage note. Pt reporting persistent pain since injury, but main reason for visit is due to elevated blood sugar d/t long term steroid use since injury and states that he doesn't know why doctor "gave me something to drive my sugar up" and would like different medications for pain control. "I just want to be normal again, I've been dealing with so much for so long"

## 2017-08-08 NOTE — ED Triage Notes (Signed)
Patient reports mechanical fall in October. Patient has left arm in sling from original injury. Patient reports pain in right shoulder and hip since original injury, with increasing severity today. Patient reports he is supposed to have an MRI of right shoulder to determine cause of pain. Patient describes pain as stinging/burning.

## 2017-08-08 NOTE — ED Notes (Signed)
ED Provider at bedside. 

## 2017-08-09 DIAGNOSIS — N3000 Acute cystitis without hematuria: Secondary | ICD-10-CM | POA: Diagnosis not present

## 2017-08-09 MED ORDER — TIZANIDINE HCL 4 MG PO TABS: 2.0000 mg | ORAL_TABLET | Freq: Two times a day (BID) | ORAL | 0 refills | Status: AC

## 2017-08-09 MED ORDER — CEPHALEXIN 500 MG PO CAPS: 500.0000 mg | ORAL_CAPSULE | Freq: Three times a day (TID) | ORAL | 0 refills | Status: AC

## 2017-08-09 MED FILL — TIVICAY/50MG/TAB: TIVICAY/50MG/TAB | 30 days supply | Qty: 30 | Fill #6

## 2017-08-09 MED FILL — BACLOFEN/10MG/TABS: BACLOFEN/10MG/TABS | 30 days supply | Qty: 30 | Fill #4

## 2017-08-09 MED FILL — DESCOVY/200MG/25MG/TABS: DESCOVY/200MG/25MG/TABS | 30 days supply | Qty: 30 | Fill #6

## 2017-08-09 NOTE — ED Provider Notes (Signed)
[facility] Emergency Department Provider Note  ____________________________________________  Time seen: Approximately 12:17 AM  I have reviewed the triage vital signs and the nursing notes.   HISTORY  Chief Complaint Shoulder Pain and Hip Pain    HPI Jared Walker is a 67 y.o. male that presents to the emergency department for evaluation of right shoulder and right hip pain since fall in October.  Patient states that he has an MRI scheduled for his right shoulder.  Hip pain is worse with walking.  He states the pain starts in his low back and radiates into his thigh.  No recent injury.  Pain has not changed in character but patient states that he is frustrated and wants answers today. He has had xrays before and they have not shown anything. He also noticed today that his urine was cloudy.  He has had a recent UTI which resolved after antibiotics.  He has chronic kidney disease and follows a provider in [place].  He is unable to take ibuprofen.  He denies bowel or bladder dysfunction or saddle paresthesias.  No nausea, vomiting, abdominal pain, dysuria, urgency, frequency, penile discharge, numbness, tingling.   Past Medical History:  Diagnosis Date  . Anemia   . Aortic stenosis, mild    mild AS by echo, 08/2011  . Cervical disc herniation   . CHF (congestive heart failure) ([place])   . Chronic kidney disease    kidney stones. chronic kidney disease, stage III. followed by doctor in [place]  . Cirrhosis ([place])    due to hepatitis c not being treated for a long periiod of time  . Coronary artery disease   . Depression   . Diabetes mellitus without complication ([place]) 69/4503   A1C 10.0  . Full dentures    upper and lower  . GERD (gastroesophageal reflux disease)   . Gout   . Heart murmur    no treatment  . Hep C w/o coma, chronic ([place]) 01/19/2015   took treatment and it is now gone.   . Hepatitis C 09/25/11  . HIV positive ([place]) 09/25/2011    nondetectable.   . Hypertension   . MI (myocardial infarction) ([place]) 2017   NSTEMI  . Neuromuscular disorder (HCC)    numbness in fingers most likely due to shoulder damage.    Patient Active Problem List   Diagnosis Date Noted  . Acute encephalopathy 11/30/2016  . Dyspnea 03/28/2016  . Elevated troponin 03/28/2016  . Acute on chronic systolic CHF (congestive heart failure) ([place]) 03/28/2016  . Swelling of lower extremity 03/28/2016  . HIV disease ([place]) 03/28/2016  . Acute on chronic diastolic CHF (congestive heart failure) ([place])   . Cirrhosis of liver without ascites ([place])   . Chronic renal failure in pediatric patient, stage 3 (moderate) ([place])   . BP (high blood pressure) 12/11/2015  . Acute non-ST elevation myocardial infarction (NSTEMI) ([place]) 11/13/2015  . Chest pain 11/09/2015  . Bradycardia 11/09/2015  . Abnormal EKG 11/09/2015  . Iron deficiency anemia 08/27/2015  . Musculoskeletal chest pain 01/19/2015  . Anemia in stage 3 chronic kidney disease ([place]) 01/19/2015  . GI bleed 01/19/2015  . Pica in adults 01/19/2015  . CKD (chronic kidney disease), stage III ([place]) 01/19/2015  . HIV (human immunodeficiency virus infection) ([place]) 01/19/2015  . HTN (hypertension) 01/19/2015  . Chronic hepatitis C without hepatic coma ([place]) 01/19/2015  . CKD (chronic kidney disease) 01/19/2015  . History of arthroscopy of shoulder 11/13/2014  Past Surgical History:  Procedure Laterality Date  . BACK SURGERY  1985   lumbar laminectomy. metal in pelvis  . bulging disc Bilateral 1990   L4-L5  . CARDIOVASCULAR STRESS TEST     at [facility]  . CHOLECYSTECTOMY    . CLOSED REDUCTION PELVIC FRACTURE    . ESOPHAGOGASTRODUODENOSCOPY (EGD) WITH PROPOFOL N/A 01/21/2015   Procedure: ESOPHAGOGASTRODUODENOSCOPY (EGD) WITH PROPOFOL;  Surgeon: Manya Silvas, MD;  Location: [facility] ENDOSCOPY;  Service: Endoscopy;  Laterality: N/A;  . ESOPHAGOGASTRODUODENOSCOPY (EGD) WITH PROPOFOL N/A 09/04/2015   Procedure:  ESOPHAGOGASTRODUODENOSCOPY (EGD) WITH PROPOFOL;  Surgeon: Manya Silvas, MD;  Location: [facility] ENDOSCOPY;  Service: Endoscopy;  Laterality: N/A;  . FRACTURE SURGERY Right    hand.  pins in hand from long ago  . KNEE ARTHROSCOPY  2009   Right  . LUMBAR LAMINECTOMY/DECOMPRESSION MICRODISCECTOMY  09/25/2011   Procedure: LUMBAR LAMINECTOMY/DECOMPRESSION MICRODISCECTOMY;  Surgeon: Erline Levine, MD;  Location: [facility] NEURO ORS;  Service: Neurosurgery;  Laterality: Left;  Left Lumbar Four-Five Microdiskectomy  . NEPHROSTOMY     tube Left  . SHOULDER ARTHROSCOPY WITH OPEN ROTATOR CUFF REPAIR Right 10/12/2014   Procedure: SHOULDER ARTHROSCOP with decompression;  Surgeon: Leanor Kail, MD;  Location: [facility];  Service: Orthopedics;  Laterality: Right;  . SHOULDER ARTHROSCOPY WITH OPEN ROTATOR CUFF REPAIR Left 07/08/2017   Procedure: SHOULDER ARTHROSCOPY WITH OPEN ROTATOR CUFF REPAIR;  Surgeon: Corky Mull, MD;  Location: [facility] ORS;  Service: Orthopedics;  Laterality: Left;    Prior to Admission medications   Medication Sig Start Date End Date Taking? Authorizing Provider  albuterol (PROVENTIL HFA;VENTOLIN HFA) 108 (90 Base) MCG/ACT inhaler Inhale 1-2 puffs into the lungs every 6 (six) hours as needed for wheezing or shortness of breath.  08/12/16   [provider]  allopurinol (ZYLOPRIM) 100 MG tablet Take 250 mg by mouth daily.     [provider]  amLODipine (NORVASC) 5 MG tablet Take 5 mg by mouth daily.  11/13/15 08/02/37  [provider]  aspirin EC 81 MG EC tablet Take 1 tablet (81 mg total) by mouth daily. 11/11/15   Bettey Costa, MD  atorvastatin (LIPITOR) 80 MG tablet Take 80 mg by mouth daily. Take in the morning. 11/13/15 08/02/37  [provider]  carvedilol (COREG) 12.5 MG tablet Take 12.5 mg by mouth 2 (two) times daily with a meal.    [provider]  cephALEXin (KEFLEX) 500 MG capsule Take 1 capsule (500 mg total) by mouth 3 (three) times  daily for 10 days. 08/08/17 08/18/17  Laban Emperor, PA-C  Cholecalciferol (VITAMIN D-1000 MAX ST) 1000 units tablet Take 1,000 Units by mouth daily.    [provider]  cloNIDine (CATAPRES) 0.1 MG tablet Take 0.1 mg by mouth 3 (three) times daily.    [provider]  clopidogrel (PLAVIX) 75 MG tablet Take 75 mg by mouth daily.    [provider]  Cranberry 400 MG CAPS Take 400 mg by mouth daily.    [provider]  DESCOVY 200-25 MG tablet Take 1 tablet by mouth at bedtime.  08/12/15   [provider]  diazepam (VALIUM) 5 MG tablet Take 5 mg by mouth at bedtime.     [provider]  dolutegravir (TIVICAY) 50 MG tablet Take 50 mg by mouth at bedtime.  07/31/15   [provider]  ferrous sulfate 325 (65 FE) MG tablet Take 325 mg by mouth daily with breakfast.  01/10/15 08/02/37  [provider]  folic acid (FOLVITE) 1 MG tablet Take 1 mg by mouth daily. In the morning    [provider]  glimepiride (AMARYL) 2 MG tablet Take 2 mg by mouth daily with breakfast.    [provider]  HYDROcodone-acetaminophen (NORCO/VICODIN) 5-325 MG tablet Directions are take one tablet 30 minutes before physical therapy, then take one tablet at bedtime as needed for pain 02/20/16   [provider]  isosorbide mononitrate (IMDUR) 30 MG 24 hr tablet Take 1 tablet (30 mg total) by mouth daily. 03/29/16   Theodoro Grist, MD  lidocaine (XYLOCAINE) 5 % ointment Apply 1 application topically daily as needed for mild pain.  07/08/16   [provider]  losartan (COZAAR) 100 MG tablet Take 100 mg by mouth every morning.  10/31/15   [provider]  nystatin (MYCOSTATIN) 100000 UNIT/ML suspension Take 5 mLs by mouth 4 (four) times daily as needed for mouth pain. 10/08/15   [provider]  Omega-3 Fatty Acids (FISH OIL PO) Take by mouth.    [provider]  omeprazole (PRILOSEC) 40 MG capsule Take 40 mg by  mouth every morning.  11/06/15   [provider]  ondansetron (ZOFRAN) 4 MG tablet TAKE 1 TABLET BY MOUTH EVERY 8 HOURS IF NEEDED FOR NAUSEA 06/03/17   [provider]  sertraline (ZOLOFT) 50 MG tablet Take 50 mg by mouth every evening.     [provider]  tiZANidine (ZANAFLEX) 4 MG tablet Take 0.5 tablets (2 mg total) by mouth 2 (two) times daily. 08/09/17 08/09/18  Laban Emperor, PA-C  torsemide (DEMADEX) 20 MG tablet Take 1 tablet (20 mg total) by mouth daily. 03/28/16   Theodoro Grist, MD    Allergies Oxycodone-acetaminophen; Buchu-cornsilk-ch grass-hydran; Colchicine; Nifedipine; and Tramadol  Family History  Problem Relation Age of Onset  . Hypertension Mother   . Cancer Father   . Heart disease Sister   . Diabetes Brother   . Anesthesia problems Neg Hx     Social History Social History   Tobacco Use  . Smoking status: Former Smoker    Packs/day: 0.50    Types: Cigarettes    Last attempt to quit: 06/1978    Years since quitting: 39.2  . Smokeless tobacco: Former Systems developer    Quit date: 03/13/1972  Substance Use Topics  . Alcohol use: No  . Drug use: No     Review of Systems  Constitutional: No fever/chills Cardiovascular: No chest pain. Respiratory: No SOB. Gastrointestinal: No abdominal pain.  No nausea, no vomiting.  Musculoskeletal: Positive for shoulder and hip pain. Skin: Negative for rash, abrasions, lacerations, ecchymosis. Neurological: Negative for numbness or tingling   ____________________________________________   PHYSICAL EXAM:  VITAL SIGNS: ED Triage Vitals  Enc Vitals Group     BP 08/08/17 2222 (!) 151/82     Pulse Rate 08/08/17 2222 84     Resp 08/08/17 2222 20     Temp 08/08/17 2222 98.7 F (37.1 C)     Temp Source 08/08/17 2222 Oral     SpO2 08/08/17 2222 97 %     Weight 08/08/17 2223 223 lb (101.2 kg)     Height --      Head Circumference --      Peak Flow --      Pain Score 08/08/17 2223 10     Pain Loc --       Pain Edu? --      Excl. in [place]? --  Constitutional: Alert and oriented. Well appearing and in no acute distress. Eyes: Conjunctivae are normal. PERRL. EOMI. Head: Atraumatic. ENT:      Ears:      Nose: No congestion/rhinnorhea.      Mouth/Throat: Mucous membranes are moist.  Neck: No stridor.  Cardiovascular: Normal rate, regular rhythm.  Good peripheral circulation. Respiratory: Normal respiratory effort without tachypnea or retractions. Lungs CTAB. Good air entry to the bases with no decreased or absent breath sounds. Gastrointestinal: Bowel sounds 4 quadrants. Soft and nontender to palpation. No guarding or rigidity. No palpable masses. No distention. No CVA tenderness. Musculoskeletal: Full range of motion to all extremities. No gross deformities appreciated.  No tenderness to palpation of her lumbar spine.  Mild tenderness to palpation over right SI joint.  Tenderness to palpation of her right trochanteric bursa.  Negative straight leg raise.  Full range of motion of right shoulder with pain.  Tenderness to palpation diffusely over her shoulder.  Strength equal in upper and lower extremities bilaterally.  Patient able to pull himself up with right arm. Antalgic gait. Neurologic:  Normal speech and language. No gross focal neurologic deficits are appreciated.  Skin:  Skin is warm, dry and intact. No rash noted. Psychiatric: Mood and affect are normal. Speech and behavior are normal. Patient exhibits appropriate insight and judgement.   ____________________________________________   LABS (all labs ordered are listed, but only abnormal results are displayed)  Labs Reviewed  URINALYSIS, COMPLETE (UACMP) WITH MICROSCOPIC - Abnormal; Notable for the following components:      Result Value   Color, Urine STRAW (*)    APPearance HAZY (*)    Leukocytes, UA LARGE (*)    Bacteria, UA RARE (*)    Squamous Epithelial / LPF 0-5 (*)    All other components within normal limits   GLUCOSE, CAPILLARY - Abnormal; Notable for the following components:   Glucose-Capillary 124 (*)    All other components within normal limits  CBG MONITORING, ED   ____________________________________________  EKG   ____________________________________________  RADIOLOGY   No results found.  ____________________________________________    PROCEDURES  Procedure(s) performed:    Procedures    Medications  cephALEXin (KEFLEX) capsule 500 mg (not administered)     ____________________________________________   INITIAL IMPRESSION / ASSESSMENT AND PLAN / ED COURSE  Pertinent labs & imaging results that were available during my care of the patient were reviewed by me and considered in my medical decision making (see chart for details).  Review of the [place] CSRS was performed in accordance of the [place] prior to dispensing any controlled drugs.     Patient's diagnosis is consistent with cystitis and lumbar radiculopathy.  Vital signs and exam are reassuring. Pain is chronic. Symptoms are consistent with lumbar radiculopathy.  No bowel or bladder dysfunction or saddle paresthesias.  Patient is agreeable to make an appointment tomorrow with primary care for outpatient imaging.  Urinalysis consistent with infection.  Patient will be discharged home with prescriptions for Keflex and zanaflex. Patient is to follow up with PCP and urology as directed. Patient is given ED precautions to return to the ED for any worsening or new symptoms.     ____________________________________________  FINAL CLINICAL IMPRESSION(S) / ED DIAGNOSES  Final diagnoses:  Acute cystitis without hematuria  Lumbar radiculopathy      NEW MEDICATIONS STARTED DURING THIS VISIT:  ED Discharge Orders        Ordered    tiZANidine (ZANAFLEX) 4 MG tablet  2 times  daily     08/09/17 0000    cephALEXin (KEFLEX) 500 MG capsule  3 times daily     08/09/17 0000          This chart was dictated  using voice recognition software/Dragon. Despite best efforts to proofread, errors can occur which can change the meaning. Any change was purely unintentional. .   Laban Emperor, PA-C 08/09/17 [place], [place], MD 08/12/17 [phone]

## 2017-08-09 NOTE — Discharge Instructions (Signed)
Please follow up with urology ASAP for recurrent UTI

## 2017-08-09 NOTE — ED Notes (Signed)
Pt. Verbalizes understanding of d/c instructions, medications, and follow-up. VS stable.  Pt. In NAD at time of d/c and denies further concerns regarding this visit. Pt. Stable at the time of departure from the unit, departing unit by the safest and most appropriate manner per that pt condition and limitations with all belongings accounted for. Pt advised to return to the ED at any time for emergent concerns, or for new/worsening symptoms.   

## 2017-08-09 NOTE — Unmapped (Signed)
[facility] Refill and Clinical Coordination Note  Medication(s): Descovy 200/25mg , Tivicay 50mg     Samuel [name], DOB: April 20, 1951  Phone: [phone] (home) , Alternate phone contact: N/A  Shipping address: [address]  [place] [address]  Phone or address changes today?: No  All above HIPAA information verified.  Insurance changes? No    Completed refill and clinical call assessment today to schedule patient's medication shipment from the [facility] [phone]).      MEDICATION RECONCILIATION    Confirmed the medication and dosage are correct and have not changed: Yes, regimen is correct and unchanged.    Were there any changes to your medication(s) in the past month:  No, there are no changes reported at this time.    ADHERENCE    Is this medicine transplant or covered by Medicare Part B? No.        Did you miss any doses in the past 4 weeks? No missed doses reported.  Adherence counseling provided? Not needed     SIDE EFFECT MANAGEMENT    Are you tolerating your medication?:  [place] reports tolerating the medication.  Side effect management discussed: None      Therapy is appropriate and should be continued.    Evidence of clinical benefit: See Epic note from 04/02/17        FINANCIAL/SHIPPING    Delivery Scheduled: Yes, Expected medication delivery date: 08/11/2017   Additional medications refilled: No additional medications/refills needed at this time.     The patient will receive an FSI print out for each medication shipped and additional FDA Medication Guides as required.  Patient education from [place] or [facility]et Le[facility] may also be included in the shipment.    [place] did not have any additional questions at this time.    Delivery address validated in FSI scheduling system: Yes, address listed above is correct.      We will follow up with patient monthly for standard refill processing and delivery.      Thank you,  Edwin Cherian  Anders Grant   [facility] Specialty Pharmacist

## 2017-08-17 ENCOUNTER — Ambulatory Visit: Admit: 2017-08-17 | Discharge: 2017-08-17 | Payer: MEDICARE | Attending: Nephrology | Primary: Nephrology

## 2017-08-17 ENCOUNTER — Ambulatory Visit: Admit: 2017-08-17 | Discharge: 2017-08-17 | Payer: MEDICARE

## 2017-08-17 DIAGNOSIS — I5023 Acute on chronic systolic (congestive) heart failure: Secondary | ICD-10-CM

## 2017-08-17 DIAGNOSIS — M1000 Idiopathic gout, unspecified site: Secondary | ICD-10-CM

## 2017-08-17 DIAGNOSIS — I2510 Atherosclerotic heart disease of native coronary artery without angina pectoris: Secondary | ICD-10-CM

## 2017-08-17 DIAGNOSIS — N3000 Acute cystitis without hematuria: Secondary | ICD-10-CM

## 2017-08-17 DIAGNOSIS — N183 Chronic kidney disease, stage 3 (moderate): Secondary | ICD-10-CM

## 2017-08-17 DIAGNOSIS — N2889 Other specified disorders of kidney and ureter: Secondary | ICD-10-CM

## 2017-08-17 DIAGNOSIS — B20 Human immunodeficiency virus [HIV] disease: Secondary | ICD-10-CM

## 2017-08-17 DIAGNOSIS — I151 Hypertension secondary to other renal disorders: Principal | ICD-10-CM

## 2017-08-17 DIAGNOSIS — I5033 Acute on chronic diastolic (congestive) heart failure: Secondary | ICD-10-CM

## 2017-08-17 DIAGNOSIS — N401 Enlarged prostate with lower urinary tract symptoms: Secondary | ICD-10-CM

## 2017-08-17 DIAGNOSIS — B182 Chronic viral hepatitis C: Secondary | ICD-10-CM

## 2017-08-17 DIAGNOSIS — I214 Non-ST elevation (NSTEMI) myocardial infarction: Secondary | ICD-10-CM

## 2017-08-17 LAB — RENAL FUNCTION PANEL
ALBUMIN: 4.6 g/dL (ref 3.5–5.0)
ANION GAP: 12 mmol/L (ref 9–15)
BLOOD UREA NITROGEN: 17 mg/dL (ref 7–21)
CALCIUM: 10.5 mg/dL — ABNORMAL HIGH (ref 8.5–10.2)
CHLORIDE: 104 mmol/L (ref 98–107)
CO2: 28 mmol/L (ref 22.0–30.0)
CREATININE: 1.97 mg/dL — ABNORMAL HIGH (ref 0.70–1.30)
GLUCOSE RANDOM: 166 mg/dL (ref 65–179)
PHOSPHORUS: 3 mg/dL (ref 2.9–4.7)
POTASSIUM: 3.8 mmol/L (ref 3.5–5.0)
SODIUM: 144 mmol/L (ref 135–145)

## 2017-08-17 LAB — CBC W/ AUTO DIFF
BASOPHILS ABSOLUTE COUNT: 0 10*9/L (ref 0.0–0.1)
BASOPHILS RELATIVE PERCENT: 0.3 %
EOSINOPHILS ABSOLUTE COUNT: 0.1 10*9/L (ref 0.0–0.4)
EOSINOPHILS RELATIVE PERCENT: 0.5 %
HEMATOCRIT: 42.4 % (ref 41.0–53.0)
HEMOGLOBIN: 13.6 g/dL (ref 13.5–17.5)
LYMPHOCYTES RELATIVE PERCENT: 18.4 %
MEAN CORPUSCULAR HEMOGLOBIN CONC: 32.1 g/dL (ref 31.0–37.0)
MEAN CORPUSCULAR HEMOGLOBIN: 29.9 pg (ref 26.0–34.0)
MEAN CORPUSCULAR VOLUME: 93 fL (ref 80.0–100.0)
MEAN PLATELET VOLUME: 8.4 fL (ref 7.0–10.0)
MONOCYTES ABSOLUTE COUNT: 0.5 10*9/L (ref 0.2–0.8)
MONOCYTES RELATIVE PERCENT: 5.1 %
NEUTROPHILS ABSOLUTE COUNT: 7.8 10*9/L — ABNORMAL HIGH (ref 2.0–7.5)
NEUTROPHILS RELATIVE PERCENT: 74.4 %
PLATELET COUNT: 183 10*9/L (ref 150–440)
RED BLOOD CELL COUNT: 4.56 10*12/L (ref 4.50–5.90)
RED CELL DISTRIBUTION WIDTH: 13.6 % (ref 12.0–15.0)
WBC ADJUSTED: 10.5 10*9/L (ref 4.5–11.0)

## 2017-08-17 LAB — BLOOD UREA NITROGEN: Urea nitrogen:MCnc:Pt:Ser/Plas:Qn:: 17

## 2017-08-17 LAB — ALBUMIN / CREATININE URINE RATIO: ALBUMIN QUANT URINE: 4.2 mg/dL

## 2017-08-17 LAB — ALBUMIN/CREATININE RATIO: Albumin/Creatinine:MRto:Pt:Urine:Qn:: 38.7 — ABNORMAL HIGH

## 2017-08-17 LAB — MEAN PLATELET VOLUME: Lab: 8.4

## 2017-08-17 MED ORDER — ALLOPURINOL 100 MG TABLET
ORAL_TABLET | Freq: Every day | ORAL | 3 refills | 0.00000 days | Status: CP
Start: 2017-08-17 — End: 2018-11-23

## 2017-08-17 MED ORDER — TAMSULOSIN 0.4 MG CAPSULE
ORAL_CAPSULE | Freq: Every day | ORAL | 3 refills | 0.00000 days | Status: CP
Start: 2017-08-17 — End: 2018-10-26

## 2017-08-17 NOTE — Unmapped (Addendum)
1. Change allopurinol to two 100 mg tablets daily    2. Continue spironolactone 25 mg every other day    3. Continue torsemide 40 mg daily    4. Labs today    5. Return in 3 months    6. Flomax 0.4 mg at bedtime

## 2017-08-17 NOTE — Unmapped (Signed)
See dictated note.

## 2017-08-18 NOTE — Unmapped (Signed)
HISTORY OF PRESENT ILLNESS:  Samuel [name] is a 67 year old gentleman who has stage G3b-4:A2 chronic kidney disease of unclear etiology.  He does not have heavy proteinuria.  He has a history of prior hepatitis C virus infection, treated with direct antiviral agents, resulting in a sustained virologic response.  He has known atherosclerotic cardiovascular disease.  He continues on clopidogrel.  His edema has recently been well controlled on spironolactone 25 mg every other day plus torsemide 40 mg daily.  He continues to suffer from chronic low back pain and has ongoing followup at the [facility].  He does not have chest pain, PND or orthopnea.  He continues on HAART therapy, which he is tolerating well.  His HIV viral load has been undetectable. Over the past several months, he has had two episodes of urinary tract infection. He had antecedent and persistent lower urinary tract symptoms.    MEDICATIONS:      Medication Sig   ??? amLODIPine (NORVASC) 2.5 MG tablet Take 1 tablet (2.5 mg total) by mouth every morning before breakfast.   ??? aspirin (ECOTRIN) 81 MG tablet    ??? atorvastatin (LIPITOR) 20 MG tablet Take 1 tablet (20 mg total) by mouth daily.   ??? baclofen (LIORESAL) 10 MG tablet Take 1 tablet (10 mg total) by mouth Three (3) times a day as needed for muscle spasms.   ??? calcium carbonate-vitamin D2 500 mg(1,250mg ) -200 unit tablet Take 1 tablet by mouth Two (2) times a day.   ??? carvedilol (COREG) 12.5 MG tablet TAKE 1 TABLET BY MOUTH TWICE DAILY   ??? cloNIDine HCl (CATAPRES) 0.1 MG tablet Take 0.1 mg by mouth 3 (three) times a day.   ??? cranberry 400 mg cap Take 400 mg by mouth daily.   ??? cyanocobalamin 1000 MCG tablet Take 1,000 mcg by mouth.   ??? diazepam (VALIUM) 5 MG tablet Take 5 mg by mouth nightly.    ??? docusate sodium (COLACE) 100 MG capsule Take 1 capsule (100 mg total) by mouth every twelve (12) hours. (Patient taking differently: Take 100 mg by mouth two (2) times a day as needed. )   ??? dolutegravir (TIVICAY) 50 mg Tab TABLET Take 1 tablet (50 mg total) by mouth daily.   ??? emtricitabine-tenofovir alafen (DESCOVY) 200-25 mg tablet Take 1 tablet by mouth daily.   ??? ferrous sulfate 325 (65 FE) MG tablet Take 325 mg by mouth daily with breakfast.    ??? folic acid (FOLVITE) 1 MG tablet    ??? glimepiride (AMARYL) 1 MG tablet take 1 tablet by mouth once daily with BREAKFAST   ??? HYDROcodone-acetaminophen (NORCO) 5-325 mg per tablet Directions are take one tablet 30 minutes before physical therapy, then take one tablet at bedtime as needed for pain   ??? lidocaine (XYLOCAINE) 5 % ointment Apply 1 application topically daily as needed.   ??? omega-3 fatty acids-fish oil 340-1,000 mg capsule Take 1 capsule by mouth.   ??? ondansetron (ZOFRAN) 4 MG tablet take 1 tablet by mouth every 8 hours if needed for nausea   ??? oxyCODONE-acetaminophen (PERCOCET) 5-325 mg per tablet Take 1 tablet by mouth.   ??? pantoprazole (PROTONIX) 40 MG tablet Take 40 mg by mouth daily.   ??? pregabalin (LYRICA) 25 MG capsule Take 1 capsule (25 mg total) by mouth once daily.   ??? sertraline (ZOLOFT) 50 MG tablet take 1 tablet by mouth once daily   ??? spironolactone (ALDACTONE) 25 MG tablet Take 1 tablet (  25 mg total) by mouth daily.   ??? torsemide (DEMADEX) 20 MG tablet Take 2 tablets (40 mg total) by mouth daily.   ??? albuterol (PROVENTIL HFA;VENTOLIN HFA) 90 mcg/actuation inhaler Inhale 2 puffs.   ??? allopurinol (ZYLOPRIM) 100 MG tablet Take 2 tablets (200 mg total) by mouth daily.   ??? nitroglycerin (NITROSTAT) 0.4 MG SL tablet Place 1 tablet (0.4 mg total) under the tongue every five (5) minutes as needed for chest pain. (Patient not taking: Reported on 08/17/2017)   ??? predniSONE (DELTASONE) 10 MG tablet Take one tablet daily by mouth for the next five days.   ??? tamsulosin (FLOMAX) 0.4 mg capsule Take 1 capsule (0.4 mg total) by mouth daily.         REVIEW OF SYSTEMS:  General:  No constitutional symptoms.  He successfully lost about 20 pounds since his last visit.  Pulmonary:  No cough.  Cardiovascular:  No chest pain.  Genitourinary:  He had 2 episodes of urinary tract infection since his last visit.  He is currently completing a course of cephalexin.  He notes nocturia x3.  All other systems are reviewed and are negative except that noted in the history of present illness.    PHYSICAL EXAMINATION:    GENERAL:  He is in no acute distress.  VITAL SIGNS:  BP 136/70 (BP Site: R Arm, BP Position: Sitting, BP Cuff Size: Medium)  - Pulse 72  - Temp 36.3 ??C (97.3 ??F)  - Wt 98.4 kg (217 lb)  - BMI 31.14 kg/m??   Wt Readings from Last 6 Encounters:   08/17/17 98.4 kg (217 lb)   04/13/17 (!) 106.1 kg (233 lb 14.4 oz)   04/02/17 (!) 104.3 kg (230 lb)   11/11/16 (!) 101.2 kg (223 lb 3.2 oz)   11/10/16 (!) 101.6 kg (224 lb)   11/05/16 (!) 101.4 kg (223 lb 8 oz)   HEENT:  Oropharynx - no lesions.  CHEST:  Clear.  HEART:  Regular rate and rhythm, S4, S1, S2.  A grade 2 systolic ejection murmur is heard at the aortic area.  ABDOMEN:  Shows moderate ascites, bowel sounds normoactive.  EXTREMITIES:  Trace pitting edema bilaterally.    LABORATORY STUDIES:    Albumin/creatinine urine ratio    Collection Time: 08/17/17 11:08 AM   Result Value Ref Range    Creat U 108.4 Undefined mg/dL    Albumin Quantitative, Urine 4.2 mg/dL    Albumin/Creatinine Ratio 38.7 (H) 0.0 - 30.0 ug/mg   Urine Culture    Collection Time: 08/17/17 11:08 AM   Result Value Ref Range    Urine Culture, Comprehensive NO GROWTH    Renal Function Panel    Collection Time: 08/17/17 11:19 AM   Result Value Ref Range    Sodium 144 135 - 145 mmol/L    Potassium 3.8 3.5 - 5.0 mmol/L    Chloride 104 98 - 107 mmol/L    CO2 28.0 22.0 - 30.0 mmol/L    BUN 17 7 - 21 mg/dL    Creatinine 1.61 (H) 0.70 - 1.30 mg/dL    BUN/Creatinine Ratio 9     EGFR MDRD Non Af Amer 34 (L) >=60 mL/min/1.30m2    EGFR MDRD Af Amer 42 (L) >=60 mL/min/1.59m2    Anion Gap 12 9 - 15 mmol/L    Glucose 166 65 - 179 mg/dL    Calcium 09.6 (H) 8.5 - 10.2 mg/dL    Phosphorus 3.0 2.9 - 4.7 mg/dL  Albumin 4.6 3.5 - 5.0 g/dL   CBC w/ Differential    Collection Time: 08/17/17 11:19 AM   Result Value Ref Range    WBC 10.5 4.5 - 11.0 10*9/L    RBC 4.56 4.50 - 5.90 10*12/L    HGB 13.6 13.5 - 17.5 g/dL    HCT 16.1 09.6 - 04.5 %    MCV 93.0 80.0 - 100.0 fL    MCH 29.9 26.0 - 34.0 pg    MCHC 32.1 31.0 - 37.0 g/dL    RDW 40.9 81.1 - 91.4 %    MPV 8.4 7.0 - 10.0 fL    Platelet 183 150 - 440 10*9/L    Neutrophils % 74.4 %    Lymphocytes % 18.4 %    Monocytes % 5.1 %    Eosinophils % 0.5 %    Basophils % 0.3 %    Absolute Neutrophils 7.8 (H) 2.0 - 7.5 10*9/L    Absolute Lymphocytes 1.9 1.5 - 5.0 10*9/L    Absolute Monocytes 0.5 0.2 - 0.8 10*9/L    Absolute Eosinophils 0.1 0.0 - 0.4 10*9/L    Absolute Basophils 0.0 0.0 - 0.1 10*9/L    Large Unstained Cells 1 0 - 4 %     Serum creatinine trend:    Date Value   08/17/2017 1.97 (H)   04/02/2017 2.03 (H)   02/23/2017 2.30 (H)   11/10/2016 3.75 (H)   11/03/2016 2.08 (H)   10/28/2016 3.34 (H)   09/16/2016 2.17 (H)   09/16/2016 2.17 (H)   08/15/2014 1.68 (H)       IMPRESSION:    1.  Stage G3b-4: A2 chronic kidney disease.  I suspect that he has global glomerulosclerosis and arterionephrosclerosis.  He does not have heavy proteinuria that would suggest HIV nephropathy.  He also has underlying cirrhosis and probably has hepatorenal physiology on a more chronic basis.  He will continue on his current regimen of spironolactone 25 mg every other day and torsemide 40 mg daily. There is no compelling indication for angiotensin blockade in the absence of over proteinuria.     2.  Hypertension, slightly increased today.  I am adding tamsulosin; therefore, I am not making any adjustment in his antihypertensive medication.  He will continue on amlodipine plus clonidine.    3.  Human immunodeficiency virus infection.  Continue HAART therapy.    4.  Lower urinary tract symptoms.  Will add tamsulosin to help ameliorate his lower urinary tract symptoms.      Zetta Bills. Stefano Gaul, MD  Date of service 08/17/2017

## 2017-08-23 ENCOUNTER — Ambulatory Visit: Admit: 2017-08-23 | Discharge: 2017-08-24 | Payer: MEDICARE

## 2017-08-23 DIAGNOSIS — M48062 Spinal stenosis, lumbar region with neurogenic claudication: Secondary | ICD-10-CM

## 2017-08-23 DIAGNOSIS — M5416 Radiculopathy, lumbar region: Principal | ICD-10-CM

## 2017-08-23 DIAGNOSIS — G894 Chronic pain syndrome: Secondary | ICD-10-CM

## 2017-08-23 NOTE — Unmapped (Addendum)
It was nice to see you today!    We have prescribed a TENS unit    You can let us know if you wish to consider an epidural steroid injection in the future     Learning About Lumbar Epidural Steroid Injections  What is a lumbar epidural steroid injection?    A doctor may give you a lumbar epidural steroid injection to try to decrease pain, tingling, or numbness in your back, buttock, or leg. These might be the result of a back or disc problem.  The injection goes directly into your epidural space. This is the area in your back around your spinal cord.  This injection may have both a local anesthetic and a steroid medicine. Or it may only have a steroid. Local anesthetic medicines numb your nerves right away for a short time. Steroids reduce swelling and pain. But they take a few days to start working.  Some people get a series of these injections over weeks or months.  How is a lumbar epidural done?  The doctor may use an imaging test before or during your injection. This can be an MRI, a CT scan, or an X-ray. These tests can show where your nerve problems are.  After finding the right spot, the doctor may inject a numbing medicine into the skin where you will get the steroid injection. Then he or she puts the needle for the steroid into the numbed area. You may feel some pressure. You could feel some stinging or burning during the injection.  It takes about 10 to 15 minutes to get this injection. You will probably go home about 20 to 30 minutes after you get it. You will need someone to drive you home.  What can you expect after a lumbar epidural?  If your injection had local anesthetic and a steroid, your legs may feel heavy or numb right after. You will probably be able to walk. But you may need to be extra careful. Take care not to lose your balance and be sure to follow your doctor's instructions.  If your injection contained local anesthetic, you may feel better right away. But this pain relief will last only a few hours. Your pain will probably return. This is because the steroids have not started working yet. Before the steroids start to work, your back may be sore for a few days.  These injections don't always work. When they do, it takes 1 to 5 days. This pain relief can last for several days to a few months or longer.  You may want to do less than normal for a few days. But you may also be able to return to your daily routine.  Some people are dizzy or feel sick to their stomach after getting this injection. These symptoms usually do not last very long.  If your pain is better, you may be able to keep doing your normal activities or physical therapy. But try not to overdo it, even if your back pain has improved a lot. If your pain is only a little better or if it comes back, your doctor may recommend another injection in a few weeks. If your pain has not changed, talk to your doctor about other treatment choices.  Side effects from an epidural steroid injection include headache, fever, or infection. Serious side effects are rare. But they can include stroke, paralysis, or loss of vision.  Follow-up care is a key part of your treatment and safety. Be sure to make  and go to all appointments, and call your doctor if you are having problems. It's also a good idea to know your test results and keep a list of the medicines you take.  Where can you learn more?  Go to [facility] at [email or web address]  Select Preferences in the upper right hand corner, then select Health Library under Resources. Enter H162 in the search box to learn more about Learning About Lumbar Epidural Steroid Injections.  Current as of: February 18, 2017  Content Version: 12.0  ?? 2006-2019 [employer]. Care instructions adapted under license by [facility]. If you have questions about a medical condition or this instruction, always ask your healthcare professional. [employer] disclaims any warranty or liability for your use of this information.

## 2017-08-23 NOTE — Unmapped (Addendum)
Department of Anesthesiology  [facility]  [address]  [place], [place] [address]  [phone]    Chronic Pain Follow Up Note    1. Lumbar radiculopathy    2. Lumbar stenosis with neurogenic claudication    3. Chronic pain syndrome        Assessment and Plan  67 y.o. male who is being [facility] the [facility] for complaint of low back pain with lumbar radiculopathy affecting the right lower extremity in the L5/S1 distribution. The patient has a history of a ?laminectomy at L5-S1 and L4-L5. His most recent lumbar spine MRI reviewed. His medical history is significant for coronary artery disease and he is on Plavix --- although not on medication list currently? and patient unsure if he is taking it. He also has chronic kidney disease with a GFR in the 30-60 range.    He is managed by his primary care physician with hydrocodone, although taking oxycodone currently post surgical rotator cuff repair. He perserverates on need for opioid medications, but we again discussed the risks of these medications with his renal function and limited efficacy in treatment of long term pain. Options are limited in light of current anticoagulation with Plavix and also renal insufficiency. Will prescribe a TENS unit today. Discussed possibility of stopping plavix for caudal ESI, but he is hesitant to proceed with procedures. He will let Samuel [name] know in the future if he wishes to proceed.     PLAN:  - Will order a TENS unit today  - He will let Samuel [name] know if he wishes to proceed with a caudal ESI in the future. Will need to confirm plavix status prior to scheduling    Future Considerations:  Caudal ESI  Repeat PT    -Return if symptoms worsen or fail to improve.    Requested Prescriptions      No prescriptions requested or ordered in this encounter     No orders of the defined types were placed in this encounter.    HPI  Samuel [name] is a 67 y.o. being followed at [facility] for complaint of chronic pain localized to low back with lumbar radiculopathy affecting the right lower extremity in the L5/S1 distribution. He had rotator cuff repair 6 weeks ago and is currently undergoing physical therapy for this. Reports progressive improvement in shoulder pain since operation. Taking oxycodone post op and notes no improvement of radicular symptoms with this medication. Notes continued severe pain related to lumbar radiculopathy and feels this has worsened since our last visit. Pain is worst in the morning and limits his walking. Tried lyrica as prescribed at last visit, but was unable to tolerate due to side effects (felt groggy, jittery, with frequent dropping of objects). Asks about a patch as his friend uses one for pain and finds it helpful.    Of note, it is well documented that he is taking plavix following NSTEMI and cath in 2017. However this is not currently on his medication list and he is unsure if he is taking it now.     Patient states that the highest level of pain is reported as 10/10 in intensity, least pain level is 6/10, pain currently is 8/10,  and average pain level is 10/10.  Pain is localized to bilateral low back and right lower extremity  Pain is described as aching, dull, sharp, shooting, tender and throbbing  Pain is present: All of the time  Pain is improved with nothing  The Pain is worse during Mornings  The patient reports that their pain negatively impacts: walking  Changes to the patient's interval medical and social history are as follows: rotator cuff repair  The patient denies new pain.   The patients pain is worse  Is the patient currently on blood thinners or anti-coagulants? plavix, but patient unsure if currently taking      Allergies  Allergies   Allergen Reactions   ??? Colchicine Analogues Diarrhea     Have diarrhea when taken for long periods of time   ??? Tramadol Other (See Comments)     unknown tolerates morphine       Home Medications    Current Outpatient Prescriptions   Medication Sig Dispense Refill   ??? aspirin (ECOTRIN) 81 MG tablet   0   ??? atorvastatin (LIPITOR) 20 MG tablet Take 1 tablet (20 mg total) by mouth daily. 90 tablet 3   ??? baclofen (LIORESAL) 10 MG tablet Take 1 tablet (10 mg total) by mouth Three (3) times a day as needed for muscle spasms. 30 tablet 2   ??? carvedilol (COREG) 12.5 MG tablet TAKE 1 TABLET BY MOUTH TWICE DAILY 60 tablet 9   ??? cloNIDine HCl (CATAPRES) 0.1 MG tablet Take 0.1 mg by mouth 3 (three) times a day.     ??? cranberry 400 mg cap Take 400 mg by mouth daily.     ??? cyanocobalamin 1000 MCG tablet Take 1,000 mcg by mouth.     ??? diazepam (VALIUM) 5 MG tablet Take 5 mg by mouth nightly.      ??? docusate sodium (COLACE) 100 MG capsule Take 1 capsule (100 mg total) by mouth every twelve (12) hours. (Patient taking differently: Take 100 mg by mouth two (2) times a day as needed. ) 60 capsule 0   ??? dolutegravir (TIVICAY) 50 mg Tab TABLET Take 1 tablet (50 mg total) by mouth daily. 30 tablet 11   ??? emtricitabine-tenofovir alafen (DESCOVY) 200-25 mg tablet Take 1 tablet by mouth daily. 30 tablet 11   ??? ferrous sulfate 325 (65 FE) MG tablet Take 325 mg by mouth daily with breakfast.      ??? folic acid (FOLVITE) 1 MG tablet   0   ??? glimepiride (AMARYL) 1 MG tablet take 2 tablet by mouth once daily with BREAKFAST  0   ??? lidocaine (XYLOCAINE) 5 % ointment Apply 1 application topically daily as needed.     ??? nitroglycerin (NITROSTAT) 0.4 MG SL tablet Place 1 tablet (0.4 mg total) under the tongue every five (5) minutes as needed for chest pain. 25 tablet 4   ??? ondansetron (ZOFRAN) 4 MG tablet take 1 tablet by mouth every 8 hours if needed for nausea     ??? oxyCODONE-acetaminophen (PERCOCET) 5-325 mg per tablet Take 1 tablet by mouth.     ??? pantoprazole (PROTONIX) 40 MG tablet Take 40 mg by mouth daily.     ??? predniSONE (DELTASONE) 10 MG tablet Take one tablet daily by mouth for the next five days.     ??? sertraline (ZOLOFT) 50 MG tablet take 1 tablet by mouth once daily     ??? spironolactone (ALDACTONE) 25 MG tablet Take 1 tablet (25 mg total) by mouth daily. 30 tablet 0   ??? tamsulosin (FLOMAX) 0.4 mg capsule Take 1 capsule (0.4 mg total) by mouth daily. 90 capsule 3   ??? torsemide (DEMADEX) 20 MG tablet Take 2 tablets (40  mg total) by mouth daily. 90 tablet 3   ??? albuterol (PROVENTIL HFA;VENTOLIN HFA) 90 mcg/actuation inhaler Inhale 2 puffs.     ??? allopurinol (ZYLOPRIM) 100 MG tablet Take 2 tablets (200 mg total) by mouth daily. 180 tablet 3   ??? amLODIPine (NORVASC) 2.5 MG tablet Take 1 tablet (2.5 mg total) by mouth every morning before breakfast. 90 tablet 3   ??? calcium carbonate-vitamin D2 500 mg(1,250mg ) -200 unit tablet Take 1 tablet by mouth Two (2) times a day.     ??? HYDROcodone-acetaminophen (NORCO) 5-325 mg per tablet Directions are take one tablet 30 minutes before physical therapy, then take one tablet at bedtime as needed for pain     ??? omega-3 fatty acids-fish oil 340-1,000 mg capsule Take 1 capsule by mouth.       No current facility-administered medications for this visit.        ROS  General none  Cardiovascular none  Gastrointestinal none  Skin none  Endocrine none  Musculoskeletal none  Neurologic none  Psychiatric none      Physical Exam    VITALS:   Vitals:    08/23/17 1248   BP: 189/88   Pulse: 88   Resp: 18   Temp: 37 ??C (98.6 ??F)   SpO2: 95%     Wt Readings from Last 3 Encounters:   08/23/17 100.7 kg (222 lb)   08/17/17 98.4 kg (217 lb)   04/13/17 (!) 106.1 kg (233 lb 14.4 oz)     GENERAL:  The patient is a well developed, well-nourished obese male and appears to be in no apparent distress. The patient is pleasant and interactive. Patient is a good historian.  No evidence of sedation or intoxication.  HEAD/NECK:    Reveals normocephalic/atraumatic. clear sclera. Mucous membranes are moist.  Range of motion: normal  CARDIOVASCULAR:   RRR, no MRG  RESPIRATORY:   Normal work of breathing, clear to auscultation bilaterally  EXTREMITIES: No clubbing, cyanosis noted.  GASTROINTESTINAL:  Soft, nondistended  NEUROLOGIC:    The patient was alert and oriented, speech fluent, normal language. Sensation Intact to light touch throughout the bilateral upper and lower extremities.  MUSCULOSKELETAL:    Motor function  5/5 in upper and lower extremities with normal tone and bulk. Good range of motion of all extremities --- left shoulder not tested due to sling and recent surgery.   SPINE:  Paraspinal tenderness to palpation present in the Lumbar back.   GAIT:  The patient rises from a seated position with minimal difficulty.  The patient was able to ambulate with minimal difficulty throughout the clinic today with the assistance of a walking aid-Cane-single footed.   SKIN:   No obvious rashes, lesions, or erythema.  PSY:  Appropriate Affect      Urine toxiciology screen  Lab Results   Component Value Date    AMPHU <500 ng/mL 11/11/2016    BARBU <200 ng/mL 11/11/2016    BENZU =/>200 ng/mL (A) 11/11/2016    CANNAU <20 ng/mL 11/11/2016    METHU <300 ng/mL 11/11/2016    OPIAU =/>300 ng/mL (A) 11/11/2016    COCAU <150 ng/mL 11/11/2016       OPIOID CONFIRMATION:  Lab Results   Component Value Date    NBUPR <5 11/11/2016    HYDROCODONE 392 11/11/2016    HYDROMORPH <50 11/11/2016    MORPHINE <50 11/11/2016    OXYCODONE <50 11/11/2016    OXMU <50 11/11/2016    MAMU <20 11/11/2016  OPIU POSITIVE 11/11/2016        BENZODIAZEPINE CONFIRMATION:  No results found for: CDIFFTOX, CDIFFTOX, OHALP, CLONU, HDXYFLRAZUR, 7NHFLU, DESALKYCONF, LORAZURQT, HDXYTRIAZUR, MIDAZURQT, BNZU

## 2017-08-24 NOTE — Unmapped (Signed)
Duplicate/error

## 2017-08-24 NOTE — Unmapped (Signed)
Faxed TENS unit RX to [facility].  Original RX scanned into Media tab of pt's chart

## 2017-09-01 ENCOUNTER — Ambulatory Visit: Admit: 2017-09-01 | Discharge: 2017-09-02 | Payer: MEDICARE

## 2017-09-01 DIAGNOSIS — B20 Human immunodeficiency virus [HIV] disease: Secondary | ICD-10-CM

## 2017-09-01 DIAGNOSIS — B182 Chronic viral hepatitis C: Secondary | ICD-10-CM

## 2017-09-01 DIAGNOSIS — K7460 Unspecified cirrhosis of liver: Secondary | ICD-10-CM

## 2017-09-01 DIAGNOSIS — N183 Chronic kidney disease, stage 3 (moderate): Secondary | ICD-10-CM

## 2017-09-01 DIAGNOSIS — I10 Essential (primary) hypertension: Secondary | ICD-10-CM

## 2017-09-01 DIAGNOSIS — Z1211 Encounter for screening for malignant neoplasm of colon: Secondary | ICD-10-CM

## 2017-09-01 LAB — CBC W/ AUTO DIFF
BASOPHILS ABSOLUTE COUNT: 0 10*9/L (ref 0.0–0.1)
BASOPHILS RELATIVE PERCENT: 0.3 %
EOSINOPHILS ABSOLUTE COUNT: 0.1 10*9/L (ref 0.0–0.4)
EOSINOPHILS RELATIVE PERCENT: 0.4 %
HEMATOCRIT: 45 % (ref 41.0–53.0)
HEMOGLOBIN: 14.1 g/dL (ref 13.5–17.5)
LARGE UNSTAINED CELLS: 1 % (ref 0–4)
LYMPHOCYTES ABSOLUTE COUNT: 1.4 10*9/L — ABNORMAL LOW (ref 1.5–5.0)
LYMPHOCYTES RELATIVE PERCENT: 9.6 %
MEAN CORPUSCULAR HEMOGLOBIN CONC: 31.4 g/dL (ref 31.0–37.0)
MEAN CORPUSCULAR HEMOGLOBIN: 29.9 pg (ref 26.0–34.0)
MEAN CORPUSCULAR VOLUME: 95.3 fL (ref 80.0–100.0)
MEAN PLATELET VOLUME: 8.8 fL (ref 7.0–10.0)
NEUTROPHILS ABSOLUTE COUNT: 12.3 10*9/L — ABNORMAL HIGH (ref 2.0–7.5)
NEUTROPHILS RELATIVE PERCENT: 85.2 %
PLATELET COUNT: 176 10*9/L (ref 150–440)
RED BLOOD CELL COUNT: 4.72 10*12/L (ref 4.50–5.90)
RED CELL DISTRIBUTION WIDTH: 13.6 % (ref 12.0–15.0)
WBC ADJUSTED: 14.5 10*9/L — ABNORMAL HIGH (ref 4.5–11.0)

## 2017-09-01 LAB — LYMPH MARKER LIMITED,FLOW
ABSOLUTE CD4 CNT: 417 {cells}/uL — ABNORMAL LOW (ref 510–2320)
ABSOLUTE CD8 CNT: 649 {cells}/uL (ref 180–1520)
CD4% (T HELPER)": 27 % — ABNORMAL LOW (ref 34–58)
CD4:CD8 RATIO: 0.6 — ABNORMAL LOW (ref 0.9–4.8)

## 2017-09-01 LAB — HIV RNA: HIV 1 RNA:NCnc:Pt:Ser/Plas:Qn:Probe.amp.tar: <40 — ABNORMAL HIGH

## 2017-09-01 LAB — WBC ADJUSTED: Lab: 14.5 — ABNORMAL HIGH

## 2017-09-01 LAB — HIV RNA, QUANTITATIVE, PCR: HIV RNA QNT RSLT: DETECTED — AB

## 2017-09-01 LAB — CD4:CD8 RATIO: Lab: 0.6 — ABNORMAL LOW

## 2017-09-01 NOTE — Unmapped (Signed)
Assessment/Plan:    Samuel [name] is a 67 y.o. male who presents to the Infectious Disease clinic for follow-up of HIV infection. He also has cirrhosis, CKD, HTN, ASCVD, and DM is followed by [facility] Hepatology, Nephrology and Cardiology, and at [facility] for Primary Care. Overall, appears to be doing well today.    1. Human immunodeficiency virus (HIV) disease (CMS-HCC):  Has had good viral control on Descovy and Tivicay, had drop in CD4 on last labs but stable %.  Will continue Descovy and Tivicay, check VL and CD4.   - CBC w/ Differential; Future  - Lymphocyte Markers Limited; Future  - HIV RNA, Quantitative, PCR; Future    2. Chronic hepatitis C, cirrhosis: SVR after treatment with DAAs.  Needs follow-up in Hepatology Clinic for cirrhosis.  Due for screening abdominal ultrasound - ordered.     3. CKD and HTN: Has had improvement in Creat and pedal edema after recent change in torsemide dose.  Continue current meds. Follow up with Dr. Stefano Gaul in Nephrology Clinic.      4. DM: HgbA1c 10.0 in 01/19, Glimeride dose increased at PCP visit.  Has upcoming appointment with PCP at [facility] for follow-up.     5. Health maintenance:   - Due for colonoscopy, referred previously but hasn't made appointment.  Referral re-entered.   - Lipids: T. Chol 178, LDL 95. HDL 40 (Care Everywhere - [facility] labs 06/08/17).  Continue Atorvastatin. Follow-up with PCP and Cardiology.    Immunization History   Administered Date(s) Administered Comments   ??? Hepatitis B, Adult 04/06/2016    ???  05/21/2016    ???  08/24/2016    ??? INFLUENZA TIV (TRI) PF (IM) 02/08/2008    ???  03/26/2010    ???  03/25/2011    ??? Influenza Vaccine Quad (IIV4 PF) 59mo+ injectable 07/24/2015    ???  02/05/2016    ??? Influenza Virus Vaccine, unspecified formulation 06/15/2014    ???  07/24/2015    ???  02/05/2016    ???  03/15/2016    ???  03/31/2017    ??? PNEUMOCOCCAL POLYSACCHARIDE 23 07/13/2000    ???  08/19/2005    ??? PPD Test 12/29/2006    ???  07/11/2008    ???  03/26/2010    ??? 03/25/2011 Duplicate   ??? Pneumococcal Conjugate 13-Valent 04/13/2012    ??? SHINGRIX-ZOSTER VACCINE (HZV), RECOMBINANT,SUB-UNIT,ADJUVANTED IM 09/16/2016    ??? TdaP 08/31/2007        Follow-up: Return to ID Clinic in 3 months, or sooner if needed.        Subjective:      CC: This is a follow-up visit for HIV infection    HPI:  Samuel [name] is a 67 y.o. male with HIV, on Descovy/Tivicay.  Last clinic visit with Dr. Tomasa Hose was in 04/2017, CD4+ cell count was 290 (down from 547 in 4/18, but % stable at 29%) and VL was < 40 copies/mL.  Has continued to tolerate ARVs well and reports 100% adherence.  He currently feels well and has no complaints.  Comes in for routine follow=up    He also has h/o chronic Hep C with cirrhosis, has had SVR after rx with Zepatier, is followed by Dr. Piedad Climes in GI Clinic.  Labs 3/19: Platelets 183, AST 40, ALT 78.     Other medical conditions include:  - s/p surgical repair of left rotator cuff tear in Feb 2019 at [facility].  Is in Physical  Therapy twice/week with improving function of his shoulder.  - CKD and HTN: followed by Dr. Stefano Gaul in Nephrology Clinic.  Torsemide recently increased to 40 mg daily and spironolactone 25 mg QOD continued, pedal edema improved, Creat 1.97 on 08/17/17 (prev 2.03).  Is also on amlodipine and clonidine.  - ASCVD: Had NSTEMI in 2017, is on Clopidogrel and Atorvastatin.  Followed in [facility] by Drs. Holland/Hicks.  - DM:  Reports that he had elevated glucose levels due to Prednisone, Glimepiride was increased from 1 mg to 2 mg daily with subsequent improvement in glucose levels. He is followed by Dr. Marcello Fennel at [facility].        Past Medical History:   Diagnosis Date   ??? CKD (chronic kidney disease) stage 3, GFR 30-59 ml/min (CMS-HCC) 01/08/2015   ??? Coronary artery disease 10/2015    Multivessel disease, plan medical management after cath 6/17   ??? GERD (gastroesophageal reflux disease)    ??? Gout    ??? HIV (human immunodeficiency virus infection) (CMS-HCC)     Undectable viral load 5/17   ??? Hypertension    ??? Nephrolithiasis    ??? Nephrotic syndrome 03/05/2014   ??? Renal mass     Followed by neprhology, MRI 8/16 improved, felt to be benign       Current Outpatient Prescriptions   Medication Sig Dispense Refill   ??? albuterol (PROVENTIL HFA;VENTOLIN HFA) 90 mcg/actuation inhaler Inhale 2 puffs.     ??? allopurinol (ZYLOPRIM) 100 MG tablet Take 2 tablets (200 mg total) by mouth daily. 180 tablet 3   ??? amLODIPine (NORVASC) 2.5 MG tablet Take 1 tablet (2.5 mg total) by mouth every morning before breakfast. 90 tablet 3   ??? aspirin (ECOTRIN) 81 MG tablet   0   ??? atorvastatin (LIPITOR) 20 MG tablet Take 1 tablet (20 mg total) by mouth daily. 90 tablet 3   ??? baclofen (LIORESAL) 10 MG tablet Take 1 tablet (10 mg total) by mouth Three (3) times a day as needed for muscle spasms. 30 tablet 2   ??? calcium carbonate-vitamin D2 500 mg(1,250mg ) -200 unit tablet Take 1 tablet by mouth Two (2) times a day.     ??? carvedilol (COREG) 12.5 MG tablet TAKE 1 TABLET BY MOUTH TWICE DAILY 60 tablet 9   ??? cefuroxime (CEFTIN) 250 MG tablet   0   ??? cloNIDine HCl (CATAPRES) 0.1 MG tablet Take 0.1 mg by mouth 3 (three) times a day.     ??? cranberry 400 mg cap Take 400 mg by mouth daily.     ??? cyanocobalamin 1000 MCG tablet Take 1,000 mcg by mouth.     ??? diazepam (VALIUM) 5 MG tablet Take 5 mg by mouth nightly.      ??? docusate sodium (COLACE) 100 MG capsule Take 1 capsule (100 mg total) by mouth every twelve (12) hours. (Patient taking differently: Take 100 mg by mouth two (2) times a day as needed. ) 60 capsule 0   ??? dolutegravir (TIVICAY) 50 mg Tab TABLET Take 1 tablet (50 mg total) by mouth daily. 30 tablet 11   ??? emtricitabine-tenofovir alafen (DESCOVY) 200-25 mg tablet Take 1 tablet by mouth daily. 30 tablet 11   ??? ferrous sulfate 325 (65 FE) MG tablet Take 325 mg by mouth daily with breakfast.      ??? folic acid (FOLVITE) 1 MG tablet   0   ??? glimepiride (AMARYL) 1 MG tablet take 2 tablet by mouth once daily with  BREAKFAST  0   ??? HYDROcodone-acetaminophen (NORCO) 5-325 mg per tablet Directions are take one tablet 30 minutes before physical therapy, then take one tablet at bedtime as needed for pain     ??? losartan (COZAAR) 100 MG tablet Take 100 mg by mouth.     ??? naproxen (NAPROSYN) 500 MG tablet TK 1 T PO BID WF  0   ??? nitroglycerin (NITROSTAT) 0.4 MG SL tablet Place 1 tablet (0.4 mg total) under the tongue every five (5) minutes as needed for chest pain. 25 tablet 4   ??? omeprazole (PRILOSEC) 40 MG capsule Take 40 mg by mouth.     ??? ondansetron (ZOFRAN) 4 MG tablet take 1 tablet by mouth every 8 hours if needed for nausea     ??? pantoprazole (PROTONIX) 40 MG tablet Take 40 mg by mouth daily.     ??? predniSONE (DELTASONE) 10 MG tablet Take one tablet daily by mouth for the next five days.     ??? sertraline (ZOLOFT) 50 MG tablet take 1 tablet by mouth once daily     ??? spironolactone (ALDACTONE) 25 MG tablet Take 1 tablet (25 mg total) by mouth daily. 30 tablet 0   ??? tamsulosin (FLOMAX) 0.4 mg capsule Take 1 capsule (0.4 mg total) by mouth daily. 90 capsule 3   ??? torsemide (DEMADEX) 20 MG tablet Take 2 tablets (40 mg total) by mouth daily. 90 tablet 3   ??? lidocaine (XYLOCAINE) 5 % ointment Apply 1 application topically daily as needed.     ??? nystatin (MYCOSTATIN) 100,000 unit/mL suspension Take 5 mL by mouth.     ??? omega-3 fatty acids-fish oil 340-1,000 mg capsule Take 1 capsule by mouth.     ??? oxyCODONE-acetaminophen (PERCOCET) 5-325 mg per tablet Take 1 tablet by mouth.     ??? tiZANidine (ZANAFLEX) 4 MG tablet Take 2 mg by mouth.       No current facility-administered medications for this visit.        Allergies: Colchicine analogues and Tramadol    Review of Systems:  No fever/sweats.  No cough or dyspnea, no chest pain.  Tolerating meals well, no nausea/vomiting or diarrhea.    The remainder of systems were negative except as noted in HPI.     Objective:         BP 139/68  - Pulse 66  - Temp 36.8 ??C (98.3 ??F) (Oral)  - Ht 180.3 cm (5' 10.98)  - Wt (!) 102.5 kg (225 lb 14.4 oz)  - BMI 31.52 kg/m??     General: Well-appearing gentleman, breathing comfortably on room air, in no apparent distress. Alert and oriented, conversant, appropriate affect.   Skin: No rashes or lesions noted on clothed exam  HEENT: PERRL, sclera anicteric. Nares without discharge, oropharynx with dentures in place, no lesions, no pharyngeal erythema or exudate.   Neck: Supple.   Cardiovascular: RRR, S1 and S2 normal. No murmur, rub, or gallop.   Lungs: Clear to auscultation bilaterally without wheezes/crackles/rhonchi.   Abdomen: Moderately distended, soft, non-tender. Normoactive bowel sounds.   Lymph Nodes: No cervical/axillary adenopathy  Extremities: Trace bipedal edema.    Labs reviewed in Epic with pertinent recent labs below:  Absolute CD4 Count (/uL)   Date Value Status   04/02/2017 290 (L) Final   09/16/2016 547 Final   03/11/2016 551 Final   08/01/2014 514 Final   01/03/2014 430 (L) Final   11/23/2012 549 Final     HIV RNA Quant Result (  no units)   Date Value Status   04/02/2017 Detected (A) Final   09/16/2016 Detected (A) Final   06/10/2016 Not Detected Final   08/15/2014 Not Detected Final   01/03/2014 Not Detected Final   11/23/2012 Not Detected Final      WBC   Date Value Ref Range Status   08/17/2017 10.5 4.5 - 11.0 10*9/L Final   08/01/2014 8.4 4.5 - 11.0 10*9/L Final     HGB   Date Value Ref Range Status   08/17/2017 13.6 13.5 - 17.5 g/dL Final   16/03/9603 54.0 (L) 13.5 - 17.5 g/dL Final     HCT   Date Value Ref Range Status   08/17/2017 42.4 41.0 - 53.0 % Final   08/01/2014 35.9 (L) 41.0 - 53.0 % Final     Platelet   Date Value Ref Range Status   08/17/2017 183 150 - 440 10*9/L Final   08/01/2014 181 150 - 440 10*9/L Final     Creatinine   Date Value Ref Range Status   08/17/2017 1.97 (H) 0.70 - 1.30 mg/dL Final   98/04/9146 8.29 (H) 0.70 - 1.30 mg/dL Final     AST   Date Value Ref Range Status   04/02/2017 40 19 - 55 U/L Final   08/01/2014 50 19 - 55 U/L Final     ALT   Date Value Ref Range Status   04/02/2017 78 (H) 19 - 72 U/L Final   08/01/2014 60 19 - 72 U/L Final

## 2017-09-01 NOTE — Unmapped (Signed)
Your Care Instructions  - Please follow-up with your Primary Care Physician  - Please have colonoscopy done.    Follow-up care is a key part of your treatment and safety. Be sure to make and go to all appointments, and call your doctor if you are having problems. It's also a good idea to know your test results and keep a list of the medicines you take.  ?? Take your medications exactly as directed, in the right dose and at the right time. Call your doctor if you think you are having a problem with your medicine.  ?? Eat a healthy diet. Good nutrition can help your immune system and improve your overall health. Talk to your doctor or see a dietitian if you need help.  ?? Get regular exercise. It relieves stress. It also keeps your heart, lungs, and muscles strong and helps you feel less tired. It may also help your immune system work better.    When should you call for help?  Call 911 anytime you think you may need emergency care. For example, call if:  ?? You have seizures.  ?? You passed out (lost consciousness).  ?? You have new weakness in an arm, a leg, or on one side of your body.  ?? You have new changes in balance or sensation (numbness, tingling, or pain).  ?? You are suddenly not able to stand or walk.  Call your doctor now or seek immediate medical care if:  ?? You have a fever that ever reaches 103??F or higher.  ?? You have a fever of 101??F or higher for 24 hours.  ?? You have shortness of breath.  ?? You have unusual bleeding, such as from your nose or gums, blood in your urine or stool, or easy bruising.  ?? You have changes in vision.  Watch closely for changes in your health, and be sure to contact your doctor if:  ?? You have a cough that does not go away, especially if you also have a fever.  ?? You have rapid, unexplained weight loss.  ?? You have night sweats.  ?? You have swollen lymph nodes in your neck, armpits, or groin.  ?? You feel very tired.         ?? 2006-2015 [employer]. Care instructions adapted under license by [facility]. This care instruction is for use with your licensed healthcare professional. If you have questions about a medical condition or this instruction, always ask your healthcare professional. [employer] Inc[employer] disclaims any warranty or liability for your use of this information.  Content Version: 10.6.465758; Current as of: Oct 20, 2013

## 2017-09-03 NOTE — Unmapped (Signed)
[facility] Refill Coordination Note  Specialty Medication(s): TIVICAY  DESCOVY  BACLOFEN      [name], DOB: [birth year]  Phone: [phone] (home) , Alternate phone contact: N/A  Phone or address changes today?: No  All above HIPAA information was verified with patient.  Shipping Address: [address]  [place] [address]   Insurance changes? No    Completed refill call assessment today to schedule patient's medication shipment from the [facility] [phone]).      Confirmed the medication and dosage are correct and have not changed: Yes, regimen is correct and unchanged.    Confirmed patient started or stopped the following medications in the past month:  No, there are no changes reported at this time.    Are you tolerating your medication?:  Xzaiver reports tolerating the medication.    ADHERENCE        Did you miss any doses in the past 4 weeks? No missed doses reported.    FINANCIAL/SHIPPING    Delivery Scheduled: Yes, Expected medication delivery date: 09/08/17     The patient will receive an FSI print out for each medication shipped and additional FDA Medication Guides as required.  Patient education from [place] or [employer]et Leu may also be included in the shipment    Talib did not have any additional questions at this time.    Delivery address validated in FSI scheduling system: Yes, address listed in FSI is correct.    We will follow up with patient monthly for standard refill processing and delivery.      Thank you,  [name]   [facility] Shared [facility] Specialty Technician

## 2017-09-07 MED FILL — BACLOFEN/10MG/TABS: BACLOFEN/10MG/TABS | 30 days supply | Qty: 30 | Fill #5

## 2017-09-07 MED FILL — DESCOVY/200MG/25MG/TABS: DESCOVY/200MG/25MG/TABS | 30 days supply | Qty: 30 | Fill #7

## 2017-09-07 MED FILL — TIVICAY/50MG/TAB: TIVICAY/50MG/TAB | 30 days supply | Qty: 30 | Fill #7

## 2017-09-07 NOTE — Unmapped (Signed)
Met w/ pt today in clinic to start RW paperwork. Application is incomplete due to pts 2019 SSI income statement is needed. Provided pt w/ a business reply envelope in which pt stated he would mail back income statement. Pt indicated he is not having issues accessing medication at this time.     1. 2019 SSI income statement    Samuel [name]

## 2017-09-15 ENCOUNTER — Ambulatory Visit: Admit: 2017-09-15 | Discharge: 2017-09-16 | Payer: MEDICARE

## 2017-09-15 DIAGNOSIS — B182 Chronic viral hepatitis C: Principal | ICD-10-CM

## 2017-10-04 ENCOUNTER — Inpatient Hospital Stay: Payer: Medicare Other | Admitting: Internal Medicine

## 2017-10-04 ENCOUNTER — Inpatient Hospital Stay: Payer: Medicare Other

## 2017-10-05 NOTE — Unmapped (Signed)
[facility] Refill Coordination Note  Specialty Medication(s): Descovy, Tivicay, Baclofen  Additional Medications shipped: n/a    Samuel [name], DOB: 09-11-1950  Phone: [phone] (home) , Alternate phone contact: N/A  Phone or address changes today?: No  All above HIPAA information was verified with patient.  Shipping Address: [address]  [place] [address]   Insurance changes? No    Completed refill call assessment today to schedule patient's medication shipment from the [facility] [phone]).      Confirmed the medication and dosage are correct and have not changed: Yes, regimen is correct and unchanged.    Confirmed patient started or stopped the following medications in the past month:  No, there are no changes reported at this time.    Are you tolerating your medication?:  Samuel [name] reports tolerating the medication.    ADHERENCE    Is this medicine transplant or covered by Medicare Part B? No.        Did you miss any doses in the past 4 weeks? No missed doses reported.    FINANCIAL/SHIPPING    Delivery Scheduled: Yes, Expected medication delivery date: 10/07/17     The patient will receive an FSI print out for each medication shipped and additional FDA Medication Guides as required.  Patient education from [place] or [employer] Leu may also be included in the shipment.    Samuel [name] did not have any additional questions at this time.    Delivery address validated in FSI scheduling system: Yes, address listed in FSI is correct.    We will follow up with patient monthly for standard refill processing and delivery.      Thank you,  Samuel [name] Vangie Bicker   [facility] Specialty Pharmacist

## 2017-10-06 MED FILL — BACLOFEN/10MG/TABS: BACLOFEN/10MG/TABS | 30 days supply | Qty: 30 | Fill #6

## 2017-10-06 MED FILL — DESCOVY/200MG/25MG/TABS: DESCOVY/200MG/25MG/TABS | 30 days supply | Qty: 30 | Fill #0

## 2017-10-06 MED FILL — TIVICAY/50MG/TAB: TIVICAY/50MG/TAB | 30 days supply | Qty: 30 | Fill #0

## 2017-10-07 ENCOUNTER — Other Ambulatory Visit: Payer: Self-pay | Admitting: *Deleted

## 2017-10-07 DIAGNOSIS — D500 Iron deficiency anemia secondary to blood loss (chronic): Secondary | ICD-10-CM

## 2017-10-08 ENCOUNTER — Inpatient Hospital Stay: Payer: Medicare Other | Attending: Internal Medicine

## 2017-10-08 ENCOUNTER — Inpatient Hospital Stay (HOSPITAL_BASED_OUTPATIENT_CLINIC_OR_DEPARTMENT_OTHER): Payer: Medicare Other | Admitting: Oncology

## 2017-10-08 ENCOUNTER — Encounter: Payer: Self-pay | Admitting: Oncology

## 2017-10-08 ENCOUNTER — Other Ambulatory Visit: Payer: Self-pay

## 2017-10-08 VITALS — BP 138/85 | HR 63 | Temp 97.9°F | Resp 20 | Ht 71.0 in | Wt 223.0 lb

## 2017-10-08 DIAGNOSIS — D500 Iron deficiency anemia secondary to blood loss (chronic): Secondary | ICD-10-CM

## 2017-10-08 DIAGNOSIS — Z87891 Personal history of nicotine dependence: Secondary | ICD-10-CM | POA: Insufficient documentation

## 2017-10-08 DIAGNOSIS — I130 Hypertensive heart and chronic kidney disease with heart failure and stage 1 through stage 4 chronic kidney disease, or unspecified chronic kidney disease: Secondary | ICD-10-CM | POA: Diagnosis not present

## 2017-10-08 DIAGNOSIS — B20 Human immunodeficiency virus [HIV] disease: Secondary | ICD-10-CM | POA: Diagnosis not present

## 2017-10-08 DIAGNOSIS — D509 Iron deficiency anemia, unspecified: Secondary | ICD-10-CM | POA: Diagnosis present

## 2017-10-08 DIAGNOSIS — K219 Gastro-esophageal reflux disease without esophagitis: Secondary | ICD-10-CM

## 2017-10-08 DIAGNOSIS — Z79899 Other long term (current) drug therapy: Secondary | ICD-10-CM | POA: Diagnosis not present

## 2017-10-08 DIAGNOSIS — F329 Major depressive disorder, single episode, unspecified: Secondary | ICD-10-CM

## 2017-10-08 DIAGNOSIS — N183 Chronic kidney disease, stage 3 unspecified: Secondary | ICD-10-CM

## 2017-10-08 DIAGNOSIS — I509 Heart failure, unspecified: Secondary | ICD-10-CM | POA: Insufficient documentation

## 2017-10-08 DIAGNOSIS — E1122 Type 2 diabetes mellitus with diabetic chronic kidney disease: Secondary | ICD-10-CM | POA: Insufficient documentation

## 2017-10-08 DIAGNOSIS — I350 Nonrheumatic aortic (valve) stenosis: Secondary | ICD-10-CM | POA: Insufficient documentation

## 2017-10-08 DIAGNOSIS — K7460 Unspecified cirrhosis of liver: Secondary | ICD-10-CM | POA: Insufficient documentation

## 2017-10-08 DIAGNOSIS — I252 Old myocardial infarction: Secondary | ICD-10-CM

## 2017-10-08 DIAGNOSIS — I2510 Atherosclerotic heart disease of native coronary artery without angina pectoris: Secondary | ICD-10-CM | POA: Insufficient documentation

## 2017-10-08 DIAGNOSIS — B182 Chronic viral hepatitis C: Secondary | ICD-10-CM

## 2017-10-08 LAB — CBC WITH DIFFERENTIAL/PLATELET
Basophils Absolute: 0.1 10*3/uL (ref 0–0.1)
Basophils Relative: 1 %
EOS ABS: 0.1 10*3/uL (ref 0–0.7)
EOS PCT: 1 %
HCT: 41.9 % (ref 40.0–52.0)
Hemoglobin: 14.2 g/dL (ref 13.0–18.0)
LYMPHS ABS: 2.2 10*3/uL (ref 1.0–3.6)
Lymphocytes Relative: 22 %
MCH: 30.8 pg (ref 26.0–34.0)
MCHC: 33.9 g/dL (ref 32.0–36.0)
MCV: 91.1 fL (ref 80.0–100.0)
MONOS PCT: 9 %
Monocytes Absolute: 0.9 10*3/uL (ref 0.2–1.0)
Neutro Abs: 6.9 10*3/uL — ABNORMAL HIGH (ref 1.4–6.5)
Neutrophils Relative %: 67 %
PLATELETS: 139 10*3/uL — AB (ref 150–440)
RBC: 4.6 MIL/uL (ref 4.40–5.90)
RDW: 14.3 % (ref 11.5–14.5)
WBC: 10.2 10*3/uL (ref 3.8–10.6)

## 2017-10-08 LAB — IRON AND TIBC
IRON: 162 ug/dL (ref 45–182)
Saturation Ratios: 42 % — ABNORMAL HIGH (ref 17.9–39.5)
TIBC: 382 ug/dL (ref 250–450)
UIBC: 220 ug/dL

## 2017-10-08 LAB — FERRITIN: FERRITIN: 64 ng/mL (ref 24–336)

## 2017-10-08 NOTE — Assessment & Plan Note (Signed)
#   Severe iron deficiency anemia/hemoglobin- 6/ ferritin 5 [march 2017]. Status post IV iron- significant improvement of hemoglobin.  Last IV Feraheme was 08/28/2015 where he received 510 mg.   #Was seen by Dr. Brahmanday in February 2019 with complaints of worsening significant pica/fatigue/cold intolerance.  Started iron PO BID.   # CKD stage III-M protein non-detectable.  Kappa lambda ratio slightly elevated at 1.74.   # Acute Abdominal Pain: Encouraged patient to take iron pills every other day see if this helps with abdominal pain/bloating.  Patient to call clinic if this does not help within the next several weeks.   #Recommend follow-up in 6 months labs and possible IV iron.   

## 2017-10-08 NOTE — Assessment & Plan Note (Signed)
#   Severe iron deficiency anemia/hemoglobin- 6/ ferritin 5 [march 2017]. Status post IV iron- significant improvement of hemoglobin.  #Most recent hemoglobin July 22, 1911; however patient complains of significant pica/fatigue/cold intolerance-check labs today iron studies ferritin; thyroid profile.  Recommend restarting iron pills twice a day.  # CKD stage III-recommend myeloma workup.  #Recommend follow-up in 2 months labs and studies; will call patient if labs from today significantly abnormal or if need IV iron. Will call - [phone]/ cell.

## 2017-10-08 NOTE — Progress Notes (Signed)
[place] NOTE  Patient Care Team: Tracie Harrier, MD as PCP - General (Internal Medicine)  CHIEF COMPLAINTS/PURPOSE OF CONSULTATION:   # SEVERE IRON DEFICIENCY ANEMIA- [EGD Aug 2016-esophageal ulcer; colonoscopy-? 2001]; EGD- April 2017- Neg. UA- neg.   # HIV- Dr.Idarmore; [facility]/Hepatitis C [no treatment]  # CKD creat- 1.6- 2.0   HISTORY OF PRESENTING ILLNESS:  Patient presents for follow-up for chronic kidney disease/severe iron deficiency anemia.  Patient had a recent left shoulder surgery in January 2019.  Recovering well with physical therapy twice a week.  Patient denies any blood in stools or black stools.  Does complain of fatigue and continued cold intolerance. Onset abdominal pain with increase of flatus. Denies shortness of breath, vomiting or weight loss. No hematuria.  Recently started p.o. Iron.  Review of Systems  Constitutional: Positive for malaise/fatigue. Negative for chills, fever and weight loss.  HENT: Negative for congestion and ear pain.   Eyes: Negative.  Negative for blurred vision and double vision.  Respiratory: Negative.  Negative for cough, sputum production and shortness of breath.   Cardiovascular: Negative.  Negative for chest pain, palpitations and leg swelling.  Gastrointestinal: Positive for abdominal pain. Negative for constipation, diarrhea, nausea and vomiting.       Increased flatus  Genitourinary: Negative for dysuria, frequency and urgency.  Musculoskeletal: Negative for back pain and falls.  Skin: Negative.  Negative for rash.  Neurological: Negative.  Negative for weakness and headaches.  Endo/Heme/Allergies: Negative.  Does not bruise/bleed easily.  Psychiatric/Behavioral: Negative.  Negative for depression. The patient is not nervous/anxious and does not have insomnia.      ROS: A complete 10 point review of system is done which is negative except mentioned above in history of present illness  MEDICAL  HISTORY:  Past Medical History:  Diagnosis Date  . Anemia   . Aortic stenosis, mild    mild AS by echo, 08/2011  . Cervical disc herniation   . CHF (congestive heart failure) ([place])   . Chronic kidney disease    kidney stones. chronic kidney disease, stage III. followed by doctor in [place]  . Cirrhosis ([place])    due to hepatitis c not being treated for a long periiod of time  . Coronary artery disease   . Depression   . Diabetes mellitus without complication ([place]) [id number]/9509   A1C 10.0  . Full dentures    upper and lower  . GERD (gastroesophageal reflux disease)   . Gout   . Heart murmur    no treatment  . Hep C w/o coma, chronic ([place]) 01/19/2015   took treatment and it is now gone.   . Hepatitis C 09/25/11  . HIV positive ([place]) 09/25/2011   nondetectable.   . Hypertension   . MI (myocardial infarction) ([place]) 2017   NSTEMI  . Neuromuscular disorder ([facility])    numbness in fingers most likely due to shoulder damage.    SURGICAL HISTORY: Past Surgical History:  Procedure Laterality Date  . BACK SURGERY  1985   lumbar laminectomy. metal in pelvis  . bulging disc Bilateral 1990   L4-L5  . CARDIOVASCULAR STRESS TEST     at [facility]  . CHOLECYSTECTOMY    . CLOSED REDUCTION PELVIC FRACTURE    . ESOPHAGOGASTRODUODENOSCOPY (EGD) WITH PROPOFOL N/A 01/21/2015   Procedure: ESOPHAGOGASTRODUODENOSCOPY (EGD) WITH PROPOFOL;  Surgeon: Manya Silvas, MD;  Location: [facility] ENDOSCOPY;  Service: Endoscopy;  Laterality: N/A;  . ESOPHAGOGASTRODUODENOSCOPY (EGD) WITH PROPOFOL N/A 09/04/2015  Procedure: ESOPHAGOGASTRODUODENOSCOPY (EGD) WITH PROPOFOL;  Surgeon: Manya Silvas, MD;  Location: [facility] ENDOSCOPY;  Service: Endoscopy;  Laterality: N/A;  . FRACTURE SURGERY Right    hand.  pins in hand from long ago  . KNEE ARTHROSCOPY  2009   Right  . LUMBAR LAMINECTOMY/DECOMPRESSION MICRODISCECTOMY  09/25/2011   Procedure: LUMBAR LAMINECTOMY/DECOMPRESSION MICRODISCECTOMY;  Surgeon: Erline Levine, MD;   Location: [place] NEURO ORS;  Service: Neurosurgery;  Laterality: Left;  Left Lumbar Four-Five Microdiskectomy  . NEPHROSTOMY     tube Left  . SHOULDER ARTHROSCOPY WITH OPEN ROTATOR CUFF REPAIR Right 10/12/2014   Procedure: SHOULDER ARTHROSCOP with decompression;  Surgeon: Leanor Kail, MD;  Location: [place];  Service: Orthopedics;  Laterality: Right;  . SHOULDER ARTHROSCOPY WITH OPEN ROTATOR CUFF REPAIR Left 07/08/2017   Procedure: SHOULDER ARTHROSCOPY WITH OPEN ROTATOR CUFF REPAIR;  Surgeon: Corky Mull, MD;  Location: [facility] ORS;  Service: Orthopedics;  Laterality: Left;    SOCIAL HISTORY: Social History   Socioeconomic History  . Marital status: Married    Spouse name: Not on file  . Number of children: Not on file  . Years of education: Not on file  . Highest education level: Not on file  Occupational History  . Not on file  Social Needs  . Financial resource strain: Not on file  . Food insecurity:    Worry: Not on file    Inability: Not on file  . Transportation needs:    Medical: Not on file    Non-medical: Not on file  Tobacco Use  . Smoking status: Former Smoker    Packs/day: 0.50    Types: Cigarettes    Last attempt to quit: 06/1978    Years since quitting: 39.3  . Smokeless tobacco: Former Systems developer    Quit date: 03/13/1972  Substance and Sexual Activity  . Alcohol use: No  . Drug use: No  . Sexual activity: Not on file  Lifestyle  . Physical activity:    Days per week: Not on file    Minutes per session: Not on file  . Stress: Not on file  Relationships  . Social connections:    Talks on phone: Not on file    Gets together: Not on file    Attends religious service: Not on file    Active member of club or organization: Not on file    Attends meetings of clubs or organizations: Not on file    Relationship status: Not on file  . Intimate partner violence:    Fear of current or ex partner: Not on file    Emotionally abused: Not on file    Physically  abused: Not on file    Forced sexual activity: Not on file  Other Topics Concern  . Not on file  Social History Narrative   Independent at baseline. Ambulates with a cane. Lives at home with his wife    FAMILY HISTORY: Family History  Problem Relation Age of Onset  . Hypertension Mother   . Cancer Father   . Heart disease Sister   . Diabetes Brother   . Anesthesia problems Neg Hx     ALLERGIES:  is allergic to oxycodone-acetaminophen; buchu-cornsilk-ch grass-hydran; colchicine; nifedipine; and tramadol.  MEDICATIONS:  Current Outpatient Medications  Medication Sig Dispense Refill  . albuterol (PROVENTIL HFA;VENTOLIN HFA) 108 (90 Base) MCG/ACT inhaler Inhale 1-2 puffs into the lungs every 6 (six) hours as needed for wheezing or shortness of breath.     . allopurinol (ZYLOPRIM)  100 MG tablet Take 300 mg by mouth daily.     Marland Kitchen amLODipine (NORVASC) 5 MG tablet Take 5 mg by mouth daily.     Marland Kitchen aspirin EC 81 MG EC tablet Take 1 tablet (81 mg total) by mouth daily. 120 tablet 0  . atorvastatin (LIPITOR) 80 MG tablet Take 80 mg by mouth daily. Take in the morning.    . carvedilol (COREG) 12.5 MG tablet Take 12.5 mg by mouth 2 (two) times daily with a meal.    . cloNIDine (CATAPRES) 0.1 MG tablet Take 0.1 mg by mouth 3 (three) times daily.    . Cranberry 400 MG CAPS Take 400 mg by mouth daily.    . DESCOVY 200-25 MG tablet Take 1 tablet by mouth at bedtime.   3  . diazepam (VALIUM) 5 MG tablet Take 5 mg by mouth at bedtime.     . dolutegravir (TIVICAY) 50 MG tablet Take 50 mg by mouth at bedtime.     . ferrous sulfate 325 (65 FE) MG tablet Take 325 mg by mouth daily with breakfast.     . folic acid (FOLVITE) 1 MG tablet Take 1 mg by mouth daily. In the morning    . glimepiride (AMARYL) 2 MG tablet Take 2 mg by mouth daily with breakfast.    . HYDROcodone-acetaminophen (NORCO/VICODIN) 5-325 MG tablet Directions are take one tablet 30 minutes before physical therapy, then take one tablet at  bedtime as needed for pain    . isosorbide mononitrate (IMDUR) 30 MG 24 hr tablet Take 1 tablet (30 mg total) by mouth daily. 30 tablet 5  . lidocaine (XYLOCAINE) 5 % ointment Apply 1 application topically daily as needed for mild pain.     Marland Kitchen losartan (COZAAR) 100 MG tablet Take 100 mg by mouth every morning.   0  . nystatin (MYCOSTATIN) 100000 UNIT/ML suspension Take 5 mLs by mouth 4 (four) times daily as needed for mouth pain.    . Omega-3 Fatty Acids (FISH OIL PO) Take by mouth.    . ondansetron (ZOFRAN) 4 MG tablet TAKE 1 TABLET BY MOUTH EVERY 8 HOURS IF NEEDED FOR NAUSEA  0  . pantoprazole (PROTONIX) 40 MG tablet Take 1 tablet by mouth daily.    . sertraline (ZOLOFT) 50 MG tablet Take 50 mg by mouth every evening.     Marland Kitchen tiZANidine (ZANAFLEX) 4 MG tablet Take 0.5 tablets (2 mg total) by mouth 2 (two) times daily. 30 tablet 0  . torsemide (DEMADEX) 20 MG tablet Take 1 tablet (20 mg total) by mouth daily. 30 tablet 5  . Cholecalciferol (VITAMIN D-1000 MAX ST) 1000 units tablet Take 1,000 Units by mouth daily.     No current facility-administered medications for this visit.       Marland Kitchen  PHYSICAL EXAMINATION:   Vitals:   10/08/17 0945  BP: 138/85  Pulse: 63  Resp: 20  Temp: 97.9 F (36.6 C)   Filed Weights   10/08/17 0943  Weight: 223 lb (101.2 kg)    Physical Exam  Constitutional: He is oriented to person, place, and time and well-developed, well-nourished, and in no distress. Vital signs are normal.  HENT:  Head: Normocephalic and atraumatic.  Eyes: Pupils are equal, round, and reactive to light.  Neck: Normal range of motion.  Cardiovascular: Normal rate, regular rhythm and normal heart sounds.  No murmur heard. Pulmonary/Chest: Effort normal and breath sounds normal. He has no wheezes.  Abdominal: Soft. Normal appearance and bowel sounds  are normal. He exhibits no distension. There is no tenderness.  Mildly distended but nontender  Musculoskeletal: Normal range of  motion. He exhibits no edema.  Neurological: He is alert and oriented to person, place, and time. Gait normal.  Skin: Skin is warm and dry. No rash noted.  Psychiatric: Mood, memory, affect and judgment normal.    LABORATORY DATA:  I have reviewed the data as listed Lab Results  Component Value Date   WBC 10.2 10/08/2017   HGB 14.2 10/08/2017   HCT 41.9 10/08/2017   MCV 91.1 10/08/2017   PLT 139 (L) 10/08/2017   Recent Labs    11/30/16 1627 12/01/16 0401 07/05/17 1150 07/17/17 1657  NA 139 139 140 142  K 4.4 4.0 4.4 3.6  CL 106 108 107 104  CO2 '23 26 25 30  ' GLUCOSE 132* 126* 213* 187*  BUN 32* 32* 20 18  CREATININE 2.95* 2.83* 2.02* 1.68*  CALCIUM 10.1 9.3 9.8 9.5  GFRNONAA 21* 22* 33* 41*  GFRAA 24* 26* 38* 48*  PROT 8.7*  --   --  7.7  ALBUMIN 4.3  --   --  3.7  AST 34  --   --  31  ALT 34  --   --  35  ALKPHOS 57  --   --  73  BILITOT 1.1  --   --  1.1     ASSESSMENT & PLAN:   # Severe iron deficiency anemia/hemoglobin- 6/ ferritin 5 [march 2017]. Status post IV iron- significant improvement of hemoglobin. Current Hemoglobin 14.2.  Iron saturations 42% with a ferritin of 64.   Last IV Feraheme was 08/28/2015 where he received 510 mg.   #Was seen by Dr. Rogue Bussing in February 2019 with complaints of worsening significant pica/fatigue/cold intolerance.  Started iron PO BID.   # CKD stage III-Dr. Rogue Bussing ordered multiple myeloma work-up. M protein non-observable.  Kappa lambda ratio slightly elevated at 1.74.   # Acute Abdominal Pain/Bloating: Encouraged patient to take iron pills every other day see if this helps with abdominal pain/bloating.  Patient to call clinic if this does not help within the next several weeks.   #Recommend follow-up in 6 months labs and possible IV iron.     Jacquelin Hawking, NP 10/08/2017 2:09 PM

## 2017-11-01 NOTE — Unmapped (Signed)
See dictated note.

## 2017-11-02 ENCOUNTER — Ambulatory Visit: Admit: 2017-11-02 | Discharge: 2017-11-02 | Payer: MEDICARE

## 2017-11-02 ENCOUNTER — Ambulatory Visit: Admit: 2017-11-02 | Discharge: 2017-11-02 | Payer: MEDICARE | Attending: Nephrology | Primary: Nephrology

## 2017-11-02 DIAGNOSIS — I214 Non-ST elevation (NSTEMI) myocardial infarction: Secondary | ICD-10-CM

## 2017-11-02 DIAGNOSIS — N183 Chronic kidney disease, stage 3 (moderate): Secondary | ICD-10-CM

## 2017-11-02 DIAGNOSIS — B20 Human immunodeficiency virus [HIV] disease: Secondary | ICD-10-CM

## 2017-11-02 DIAGNOSIS — I151 Hypertension secondary to other renal disorders: Principal | ICD-10-CM

## 2017-11-02 DIAGNOSIS — I5033 Acute on chronic diastolic (congestive) heart failure: Secondary | ICD-10-CM

## 2017-11-02 DIAGNOSIS — I2510 Atherosclerotic heart disease of native coronary artery without angina pectoris: Secondary | ICD-10-CM

## 2017-11-02 DIAGNOSIS — N2889 Other specified disorders of kidney and ureter: Secondary | ICD-10-CM

## 2017-11-02 DIAGNOSIS — B182 Chronic viral hepatitis C: Secondary | ICD-10-CM

## 2017-11-02 DIAGNOSIS — K7469 Other cirrhosis of liver: Secondary | ICD-10-CM

## 2017-11-02 DIAGNOSIS — M1000 Idiopathic gout, unspecified site: Secondary | ICD-10-CM

## 2017-11-02 LAB — ALBUMIN QUANT URINE: Albumin:MCnc:Pt:Urine:Qn:: 4

## 2017-11-02 LAB — RENAL FUNCTION PANEL
ANION GAP: 9 mmol/L (ref 9–15)
BLOOD UREA NITROGEN: 14 mg/dL (ref 7–21)
BUN / CREAT RATIO: 9
CALCIUM: 10.7 mg/dL — ABNORMAL HIGH (ref 8.5–10.2)
CHLORIDE: 104 mmol/L (ref 98–107)
CO2: 29 mmol/L (ref 22.0–30.0)
CREATININE: 1.49 mg/dL — ABNORMAL HIGH (ref 0.70–1.30)
EGFR MDRD AF AMER: 57 mL/min/{1.73_m2} — ABNORMAL LOW (ref >=60)
EGFR MDRD NON AF AMER: 47 mL/min/{1.73_m2} — ABNORMAL LOW (ref >=60)
GLUCOSE RANDOM: 192 mg/dL — ABNORMAL HIGH (ref 65–179)
PHOSPHORUS: 3.4 mg/dL (ref 2.9–4.7)
SODIUM: 142 mmol/L (ref 135–145)

## 2017-11-02 LAB — CBC W/ AUTO DIFF
BASOPHILS RELATIVE PERCENT: 0.4 %
EOSINOPHILS ABSOLUTE COUNT: 0.1 10*9/L (ref 0.0–0.4)
EOSINOPHILS RELATIVE PERCENT: 0.6 %
HEMATOCRIT: 44.7 % (ref 41.0–53.0)
HEMOGLOBIN: 14.1 g/dL (ref 13.5–17.5)
LARGE UNSTAINED CELLS: 2 % (ref 0–4)
LYMPHOCYTES ABSOLUTE COUNT: 1.9 10*9/L (ref 1.5–5.0)
LYMPHOCYTES RELATIVE PERCENT: 20.4 %
MEAN CORPUSCULAR HEMOGLOBIN CONC: 31.6 g/dL (ref 31.0–37.0)
MEAN CORPUSCULAR HEMOGLOBIN: 29.8 pg (ref 26.0–34.0)
MEAN CORPUSCULAR VOLUME: 94.1 fL (ref 80.0–100.0)
MEAN PLATELET VOLUME: 8.3 fL (ref 7.0–10.0)
MONOCYTES ABSOLUTE COUNT: 0.5 10*9/L (ref 0.2–0.8)
MONOCYTES RELATIVE PERCENT: 5.8 %
NEUTROPHILS ABSOLUTE COUNT: 6.6 10*9/L (ref 2.0–7.5)
NEUTROPHILS RELATIVE PERCENT: 71.1 %
PLATELET COUNT: 180 10*9/L (ref 150–440)
RED CELL DISTRIBUTION WIDTH: 14.6 % (ref 12.0–15.0)
WBC ADJUSTED: 9.3 10*9/L (ref 4.5–11.0)

## 2017-11-02 LAB — THYROID STIMULATING HORMONE: Thyrotropin:ACnc:Pt:Ser/Plas:Qn:: 0.337 — ABNORMAL LOW

## 2017-11-02 LAB — POTASSIUM: Potassium:SCnc:Pt:Ser/Plas:Qn:: 4.6

## 2017-11-02 LAB — PARATHYROID HORMONE INTACT: Parathyrin.intact:MCnc:Pt:Ser/Plas:Qn:: 55.3

## 2017-11-02 LAB — ALBUMIN / CREATININE URINE RATIO: CREATININE, URINE: 57.9 mg/dL

## 2017-11-02 LAB — GAMMAGLOBULIN; IGA: IgA:MCnc:Pt:Ser/Plas:Qn:: 365.5

## 2017-11-02 LAB — MYELOMA SERUM CHEMISTRIES
GAMMAGLOBULIN; IGA: 365.5 mg/dL (ref 40.0–400.0)
GAMMAGLOBULIN; IGM: 59 mg/dL (ref 35–290)

## 2017-11-02 LAB — EOSINOPHILS ABSOLUTE COUNT: Lab: 0.1

## 2017-11-02 LAB — FREE T4: Thyroxine.free:MCnc:Pt:Ser/Plas:Qn:: 0.88

## 2017-11-02 MED ORDER — AMLODIPINE 2.5 MG TABLET
ORAL_TABLET | Freq: Every day | ORAL | 3 refills | 0.00000 days | Status: CP
Start: 2017-11-02 — End: 2018-10-18

## 2017-11-02 NOTE — Unmapped (Signed)
Addended by: Jackelyn Poling on: 11/02/2017 12:52 PM     Modules accepted: Orders

## 2017-11-02 NOTE — Unmapped (Signed)
1. Add back amlodipine 2.5 mg daily    2. Return in September    3. Labs today-I will call you later this week with the results.

## 2017-11-03 NOTE — Unmapped (Addendum)
HISTORY OF PRESENT ILLNESS:  [name] is a 67 year old gentleman who has stage G3b-4:A2 chronic kidney disease. The etiology is not entirely clear, and he is not interested in pursiung a kidney biopsy after we discussed the benefits and risks. He prefers continued expectant management with risk factor and CKD progression mitigation because he has demonstrated stable renal function over time and because does not have significant proteinuria or abnormalities in the urinary sediment.  He has a history of hepatitis C virus infection that responded to direct-acting antiviral agents.  He also has HIV infection that is quite stable on highly active antiretroviral therapy.  His edema has been relatively well controlled, though he still has leg edema.  More aggressive diuresis has led to worsening azotemia.  Therefore, we decided to tolerate low-grade edema, managing with compression stockings.  He continues on torsemide 40 mg daily in addition of spironolactone 25 mg every other day.  He does not have significant hyperkalemia. He had been holding his amlodipine since his surgery.  Amlodipine will be restarted today.    MEDICATIONS:      Medication Sig   ??? allopurinol (ZYLOPRIM) 100 MG tablet Take 2 tablets (200 mg total) by mouth daily.   ??? amLODIPine (NORVASC) 2.5 MG tablet Take 1 tablet (2.5 mg total) by mouth every morning before breakfast. ADDED BACK TODAY.   ??? aspirin (ECOTRIN) 81 MG tablet    ??? atorvastatin (LIPITOR) 20 MG tablet Take 1 tablet (20 mg total) by mouth daily.   ??? baclofen (LIORESAL) 10 MG tablet Take 1 tablet (10 mg total) by mouth Three (3) times a day as needed for muscle spasms.   ??? calcium carbonate-vitamin D2 500 mg(1,250mg ) -200 unit tablet Take 1 tablet by mouth Two (2) times a day. DISCONTINUED TODAY.   ??? carvedilol (COREG) 12.5 MG tablet TAKE 1 TABLET BY MOUTH TWICE DAILY   ??? cloNIDine HCl (CATAPRES) 0.1 MG tablet Take 0.1 mg by mouth 3 (three) times a day.   ??? cranberry 400 mg cap Take 400 mg by mouth daily.   ??? cyanocobalamin 1000 MCG tablet Take 1,000 mcg by mouth.   ??? diazepam (VALIUM) 5 MG tablet Take 5 mg by mouth nightly.    ??? docusate sodium (COLACE) 100 MG capsule Take 1 capsule (100 mg total) by mouth every twelve (12) hours. (Patient taking differently: Take 100 mg by mouth two (2) times a day as needed. )   ??? dolutegravir (TIVICAY) 50 mg Tab TABLET Take 1 tablet (50 mg total) by mouth daily.   ??? emtricitabine-tenofovir alafen (DESCOVY) 200-25 mg tablet Take 1 tablet by mouth daily.   ??? ferrous sulfate 325 (65 FE) MG tablet Take 325 mg by mouth daily with breakfast.    ??? folic acid (FOLVITE) 1 MG tablet    ??? glimepiride (AMARYL) 1 MG tablet take 2 tablet by mouth once daily with  Breakfast.   ??? losartan (COZAAR) 100 MG tablet Take 100 mg by mouth.   ??? nystatin (MYCOSTATIN) 100,000 unit/mL suspension Take 5 mL by mouth.   ??? omega-3 fatty acids-fish oil 340-1,000 mg capsule Take 1 capsule by mouth.   ??? omeprazole (PRILOSEC) 40 MG capsule Take 40 mg by mouth.   ??? sertraline (ZOLOFT) 50 MG tablet take 1 tablet by mouth once daily   ??? spironolactone (ALDACTONE) 25 MG tablet Take 1 tablet (25 mg total) by mouth daily.   ??? tamsulosin (FLOMAX) 0.4 mg capsule Take 1 capsule (0.4 mg total) by mouth daily.   ???  tiZANidine (ZANAFLEX) 4 MG tablet Take 2 mg by mouth.   ??? torsemide (DEMADEX) 20 MG tablet Take 2 tablets (40 mg total) by mouth daily.   ??? albuterol (PROVENTIL HFA;VENTOLIN HFA) 90 mcg/actuation inhaler Inhale 2 puffs.   ??? HYDROcodone-acetaminophen (NORCO) 5-325 mg per tablet Directions are take one tablet 30 minutes before physical therapy, then take one tablet at bedtime as needed for pain   ??? lidocaine (XYLOCAINE) 5 % ointment Apply 1 application topically daily as needed.   ??? nitroglycerin (NITROSTAT) 0.4 MG SL tablet Place 1 tablet (0.4 mg total) under the tongue every five (5) minutes as needed for chest pain.   ??? ondansetron (ZOFRAN) 4 MG tablet take 1 tablet by mouth every 8 hours if needed for nausea   ??? oxyCODONE-acetaminophen (PERCOCET) 5-325 mg per tablet Take 1 tablet by mouth.       REVIEW OF SYSTEMS:  Stable weight.  Cardiovascular:  No chest pain.  Genitourinary:  Nocturia x1 after initiation of tamsulosin.  All systems are reviewed, are negative except that noted in the history of present illness.    PHYSICAL EXAMINATION:    GENERAL:  He is in no acute distress.  VITAL SIGNS:  BP 138/78 (BP Site: R Arm, BP Position: Sitting, BP Cuff Size: Medium)  - Pulse 67  - Temp 36.8 ??C (98.3 ??F) (Temporal)  - Ht 180.3 cm (5' 10.98)  - Wt (!) 102.3 kg (225 lb 9.6 oz)  - BMI 31.48 kg/m??   Wt Readings from Last 3 Encounters:   11/02/17 (!) 102.3 kg (225 lb 9.6 oz)   09/01/17 (!) 102.5 kg (225 lb 14.4 oz)   08/23/17 100.7 kg (222 lb)   HEENT:  Oropharynx - no oral ulcers.  LUNGS:  Clear.  CARDIOVASCULAR:  Significant for an S4 gallop.  A grade 2 systolic ejection murmur is heard best at the aortic area.  ABDOMEN:  Soft, nontender, bowel sounds are normoactive.  The abdomen is protuberant, but there is no shifting dullness.  EXTREMITIES:  1+ pitting edema bilaterally.    LABORATORY STUDIES:      Albumin/creatinine urine ratio    Collection Time: 11/02/17 10:42 AM   Result Value Ref Range    Creat U 57.9 Undefined mg/dL    Albumin Quantitative, Urine 4.0 mg/dL    Albumin/Creatinine Ratio 69.1 (H) 0.0 - 30.0 ug/mg   Renal Function Panel    Collection Time: 11/02/17 11:29 AM   Result Value Ref Range    Sodium 142 135 - 145 mmol/L    Potassium 4.6 3.5 - 5.0 mmol/L    Chloride 104 98 - 107 mmol/L    CO2 29.0 22.0 - 30.0 mmol/L    BUN 14 7 - 21 mg/dL    Creatinine 0.86 (H) 0.70 - 1.30 mg/dL    BUN/Creatinine Ratio 9     EGFR MDRD Non Af Amer 47 (L) >=60 mL/min/1.98m2    EGFR MDRD Af Amer 57 (L) >=60 mL/min/1.17m2    Anion Gap 9 9 - 15 mmol/L    Glucose 192 (H) 65 - 179 mg/dL    Calcium 57.8 (H) 8.5 - 10.2 mg/dL    Phosphorus 3.4 2.9 - 4.7 mg/dL    Albumin 4.6 3.5 - 5.0 g/dL   CBC w/ Differential Collection Time: 11/02/17 11:29 AM   Result Value Ref Range    WBC 9.3 4.5 - 11.0 10*9/L    RBC 4.75 4.50 - 5.90 10*12/L    HGB 14.1 13.5 - 17.5  g/dL    HCT 02.7 25.3 - 66.4 %    MCV 94.1 80.0 - 100.0 fL    MCH 29.8 26.0 - 34.0 pg    MCHC 31.6 31.0 - 37.0 g/dL    RDW 40.3 47.4 - 25.9 %    MPV 8.3 7.0 - 10.0 fL    Platelet 180 150 - 440 10*9/L    Neutrophils % 71.1 %    Lymphocytes % 20.4 %    Monocytes % 5.8 %    Eosinophils % 0.6 %    Basophils % 0.4 %    Absolute Neutrophils 6.6 2.0 - 7.5 10*9/L    Absolute Lymphocytes 1.9 1.5 - 5.0 10*9/L    Absolute Monocytes 0.5 0.2 - 0.8 10*9/L    Absolute Eosinophils 0.1 0.0 - 0.4 10*9/L    Absolute Basophils 0.0 0.0 - 0.1 10*9/L    Large Unstained Cells 2 0 - 4 %    Macrocytosis Slight (A) Not Present    Hypochromasia Slight (A) Not Present   MYELOMA WORKUP, SERUM    Collection Time: 11/02/17 11:29 AM   Result Value Ref Range    ALBUMIN (SPE) 4.3 3.5 - 5.0 g/dL    ALPHA-1 GLOBULIN 0.3 0.2 - 0.5 g/dL    ALPHA-2 GLOBULIN 0.9 0.5 - 1.1 g/dL    BETA-1 GLOBULIN 0.5 0.3 - 0.6 g/dL    BETA-2 GLOBULIN 0.4 0.2 - 0.6 g/dL    GAMMAGLOBULIN 1.1 0.5 - 1.5 g/dL    SPE Interpretation The SPE pattern demonstrates an irregularity in the gamma region, which may represent monoclonal protein.      Immunofixation Electrophoresis, Serum No monoclonal protein detected.      Total Protein 7.6 6.5 - 8.3 g/dL   Myeloma Workup Chemistries    Collection Time: 11/02/17 11:29 AM   Result Value Ref Range    Total Protein 7.6 6.5 - 8.3 g/dL    Total IgG 5,638 756-4,332 mg/dL    IgM 59 35 - 951 mg/dL    IgA 884.1 66.0 - 630.1 mg/dL   PTH    Collection Time: 11/02/17 11:29 AM   Result Value Ref Range    PTH 55.3 12.0 - 72.0 pg/mL    Calcium 10.8 (H) 8.5 - 10.2 mg/dL   TSH    Collection Time: 11/02/17 11:29 AM   Result Value Ref Range    TSH 0.337 (L) 0.600 - 3.300 uIU/mL   T4, free    Collection Time: 11/02/17 11:29 AM   Result Value Ref Range    Free T4 0.88 0.71 - 1.40 ng/dL       Urine sediment: No cells or formed elements.     IMPRESSION AND PLAN:    1.  Stage G3a-b:A2 chronic kidney disease.  The etiology of his CKD is not entirely clear.  He most likely has underlying arterionephrosclerosis focal global glomerulosclerosis based on evidence of vascular disease elsewhere (e.g., coronary artery disease).  I think HIV-associated nephropathy is unlikely given the absence of proteinuria. Finally, there is nothing in the urine sediment to suggest immune complex glomerulonephritis. Furthermore, serum complement levels have been normal. The plan is for continued risk factor and CKD progression mitigation.    2.  Hypertension.  His blood pressure is slightly above goal.  Will add back amlodipine at 2.5 mg daily.  He will continue carvedilol, clonidine, and diuretic therapy.    3.  Human immunodeficiency virus infection, stable on HAART therapy.    4.  Lower urinary  tract symptoms.  Continue tamsulosin.    5.  Hypercalcemia. His PTH is relatviely suppressed indicating non-PTH mediated hypercalcemia. He does not have a monoclonal paraprotein. Will add a 25-OH-D level to his laboratory studies. If normal, will check a 1,25-dihydroxy-vitamin D level and PTHrP. In the interim, he should stop taking calcium carbonate-vitamin D2 supplementation.     6.  Nephrology follow up:  I will see the patient back in 3 months.    Zetta Bills. Stefano Gaul, MD  Date of service 11/02/2017

## 2017-11-04 LAB — SPE INTERPRETATION: Lab: 0

## 2017-11-04 LAB — MYELOMA WORKUP, SERUM
ALPHA-2 GLOBULIN: 0.9 g/dL (ref 0.5–1.1)
BETA-1 GLOBULIN: 0.5 g/dL (ref 0.3–0.6)
GAMMAGLOBULIN: 1.1 g/dL (ref 0.5–1.5)
PROTEIN TOTAL: 7.6 g/dL (ref 6.5–8.3)

## 2017-11-09 MED FILL — DESCOVY/200MG/25MG/TABS: DESCOVY/200MG/25MG/TABS | 30 days supply | Qty: 30 | Fill #1

## 2017-11-09 MED FILL — TIVICAY/50MG/TAB: TIVICAY/50MG/TAB | 30 days supply | Qty: 30 | Fill #1

## 2017-11-09 MED FILL — ATORVASTATIN/20MG/TABS: ATORVASTATIN/20MG/TABS | 90 days supply | Qty: 90 | Fill #1

## 2017-11-09 MED FILL — BACLOFEN/10MG/TABS: BACLOFEN/10MG/TABS | 30 days supply | Qty: 30 | Fill #7

## 2017-11-09 NOTE — Unmapped (Signed)
[facility] Refill Coordination Note  Specialty Medication(s): DESCOVY  TIVICAY  BACLOFEN  ATORVASTATIN      Samuel [name], DOB: 1951-02-20  Phone: [phone] (home) , Alternate phone contact: N/A  Phone or address changes today?: No  All above HIPAA information was verified with patient.  Shipping Address: [address]  [place] [address]   Insurance changes? No    Completed refill call assessment today to schedule patient's medication shipment from the [facility] [phone]).      Confirmed the medication and dosage are correct and have not changed: Yes, regimen is correct and unchanged.    Confirmed patient started or stopped the following medications in the past month:  No, there are no changes reported at this time.    Are you tolerating your medication?:  Samuel [name] reports tolerating the medication.    ADHERENCE        Did you miss any doses in the past 4 weeks? No missed doses reported.    FINANCIAL/SHIPPING    Delivery Scheduled: Yes, Expected medication delivery date: 6/13 VIA [facility]     The patient will receive an [facility]SI print out for each medication shipped and additional FDA Medication Guides as required.  Patient education from Helen or Robet Leu may also be included in the shipment    Schneider did not have any additional questions at this time.    Delivery address validated in FSI scheduling system: Yes, address listed in FSI is correct.    We will follow up with patient monthly for standard refill processing and delivery.      Thank you,  [name]   [facility] Specialty Technician

## 2017-11-29 ENCOUNTER — Ambulatory Visit: Admit: 2017-11-29 | Discharge: 2017-11-30 | Payer: MEDICARE | Attending: Family | Primary: Family

## 2017-11-29 DIAGNOSIS — Z8619 Personal history of other infectious and parasitic diseases: Secondary | ICD-10-CM

## 2017-11-29 DIAGNOSIS — R1084 Generalized abdominal pain: Principal | ICD-10-CM

## 2017-11-29 DIAGNOSIS — K7460 Unspecified cirrhosis of liver: Secondary | ICD-10-CM

## 2017-11-29 DIAGNOSIS — K219 Gastro-esophageal reflux disease without esophagitis: Secondary | ICD-10-CM

## 2017-11-29 LAB — HEPATIC FUNCTION PANEL
ALT (SGPT): 36 U/L (ref 19–72)
AST (SGOT): 38 U/L (ref 19–55)
PROTEIN TOTAL: 7.7 g/dL (ref 6.5–8.3)

## 2017-11-29 LAB — PROTEIN TOTAL: Protein:MCnc:Pt:Ser/Plas:Qn:: 7.7

## 2017-11-29 LAB — PROTIME-INR: PROTIME: 12.2 s (ref 10.2–12.8)

## 2017-11-29 LAB — VITAMIN D, TOTAL (25OH): Lab: 32.4

## 2017-11-29 LAB — AFP-TUMOR MARKER: Alpha-1-Fetoprotein.tumor marker:MCnc:Pt:Ser/Plas:Qn:: 3

## 2017-11-29 LAB — INR: Lab: 1.07

## 2017-11-29 MED ORDER — OMEPRAZOLE 20 MG CAPSULE,DELAYED RELEASE
ORAL_CAPSULE | Freq: Every day | ORAL | 3 refills | 0.00000 days | Status: CP
Start: 2017-11-29 — End: 2018-11-29

## 2017-11-29 NOTE — Unmapped (Signed)
1.  Laboratory studies done today as part of continued cirrhosis care.   2.  Office follow up six months.   3. Remain scheduled for upcoming abdominal ultrasound.   4.  Healthy diet, regular exercise program and weight management. Goal of 10 pound weight loss by next visit.   5.  Start Omeprazole 20 mg once daily 30-60 minutes prior to breakfast.   6.  See reflux guideline information below.   7.  NO alcohol use.   8.  Any questions or concerns please notify our office.   9.  If reflux/abdominal pain does not seem to improve over the next couple of weeks please follow up with Dr. Lynnae Prude.       Gastroesophageal Reflux Disease (GERD): Care Instructions  Your Care Instructions    Gastroesophageal reflux disease (GERD) is the backward flow of stomach acid into the esophagus. The esophagus is the tube that leads from your throat to your stomach. A one-way valve prevents the stomach acid from moving up into this tube. When you have GERD, this valve does not close tightly enough.  If you have mild GERD symptoms including heartburn, you may be able to control the problem with antacids or over-the-counter medicine. Changing your diet, losing weight, and making other lifestyle changes can also help reduce symptoms.  Follow-up care is a key part of your treatment and safety. Be sure to make and go to all appointments, and call your doctor if you are having problems. It's also a good idea to know your test results and keep a list of the medicines you take.  How can you care for yourself at home?  ?? Take your medicines exactly as prescribed. Call your doctor if you think you are having a problem with your medicine.  ?? Your doctor may recommend over-the-counter medicine. For mild or occasional indigestion, antacids, such as Tums, Gaviscon, Mylanta, or Maalox, may help. Your doctor also may recommend over-the-counter acid reducers, such as Pepcid AC, Tagamet HB, Zantac 75, or Prilosec. Read and follow all instructions on the label. If you use these medicines often, talk with your doctor.  ?? Change your eating habits.  ? It's best to eat several small meals instead of two or three large meals.  ? After you eat, wait 2 to 3 hours before you lie down.  ? Chocolate, mint, and alcohol can make GERD worse.  ? Spicy foods, foods that have a lot of acid (like tomatoes and oranges), and coffee can make GERD symptoms worse in some people. If your symptoms are worse after you eat a certain food, you may want to stop eating that food to see if your symptoms get better.  ?? Do not smoke or chew tobacco. Smoking can make GERD worse. If you need help quitting, talk to your doctor about stop-smoking programs and medicines. These can increase your chances of quitting for good.  ?? If you have GERD symptoms at night, raise the head of your bed 6 to 8 inches by putting the frame on blocks or placing a foam wedge under the head of your mattress. (Adding extra pillows does not work.)  ?? Do not wear tight clothing around your middle.  ?? Lose weight if you need to. Losing just 5 to 10 pounds can help.  When should you call for help?  Call your doctor now or seek immediate medical care if:  ?? ?? You have new or different belly pain.   ?? ?? Your stools  are black and tarlike or have streaks of blood.   ??Watch closely for changes in your health, and be sure to contact your doctor if:  ?? ?? Your symptoms have not improved after 2 days.   ?? ?? Food seems to catch in your throat or chest.   Where can you learn more?  Go to [facility] at [email or web address]  Select Preferences in the upper right hand corner, then select Health Library under Resources. Enter [phone] in the search box to learn more about Gastroesophageal Reflux Disease (GERD): Care Instructions.  Current as of: April 07, 2017  Content Version: 12.1  ?? 2006-2019 [employer]. Care instructions adapted under license by [facility]. If you have questions about a medical condition or this instruction, always ask your healthcare professional. [employer] Inc[employer] disclaims any warranty or liability for your use of this information.

## 2017-11-29 NOTE — Unmapped (Signed)
[facility] Ph [phone] Fax [phone]      Woodfin Ganja, M.D.  Professor of Medicine  Director, [facility]  [place] [facility] [place] [facility] [place]    Referring Provider:  Barbette Reichmann, MD  [address]  [facility] [place], [place] [address]     Primary Care Provider:  Barbette Reichmann, MD    CHIEF COMPLAINT:  Continued liver care. + well compensated cirrhosis secondary to HCV ~ cured.     HISTORY OF PRESENT ILLNESS:   This is a 67 y.o. AA male with history of HCV and HIV coinfection, who presents today for continued cirrhosis care.  He has been successfully treated and cured of HCV s/p Zepatier x 12 weeks - NS5A RAV 07/24/15 no resistance predicted. His start date was 04/20/2016 with completion date of July 13, 2016. His SVR HCV RNA level was not detected indicating evidence of having achieved SVR - CURED. He adhered to taking Zepatier one tablet daily ~ 8 pm nightly with the exception of having missed one dose of treatment. Denied any alcohol use during treatment.  He did experience mild headaches which were resolved at the end of treatment.      Interim History:  He presents alone today.  He was treatment-naive with genotype 1a of which he had been diagnosed ~1990s. The mode of transmission was likely secondary to IVDU. He was diagnosed with HIV in 2001. For years, patient was hesitant to initiate therapy for both HIV and HCV. Beginning ~2010, patient started on Atripla. He was transitioned to Descovy/dolutegravir due to worsening CKD and remains stable on this ART regimen. Most recent HIV RNA level detected less than 40 IU/mL (09/01/2017). Liver biopsy 05/2005 then showed grade 2 stage 2 cirrhosis.     Otherwise, he denies history of hepatic decompensation specifically variceal bleed, ascites, SBP or encephalopathy. His risk factors for more aggressive fibrosis with HCV include HIV and truncal obesity. Patient's medical history is significant for cardiovascular disease. He was admitted 10/2015 for NSTEMI with mult-vessel disease on LHC.  He has been under the care of Dr. Marcelle Overlie, cardiology department at [facility]. Since his last clinic visit on 10/28/2016, he has been diagnosed with Stage III -IV CKD.  He is under the care of Dr. Stefano Gaul at [facility].  Most recent visit was 11/02/2017.  Recommendation to stop taking Ca+ with Vitamin D and to restart Norvasc 2.5 mg daily.     Denies any jaundice, pruritis, SOB, CP, increased abdominal girth, LE edema, melena, BRBPR, confusion, depression, nausea, vomiting, or constipation. He continues to eat diet high in sugar content. Last visit admitted to hiding donuts from his wife. + GERD symptoms. Under the care of local GI provider, Dr. Lynnae Prude for endoscopy evaluation and surveillance.     He fell October 2019 and recovering from [facility] injury involving his left shoulder. Undergoing PT. History of spinal stenosis which limits his exercise per patient. Noted weight gain.      Cirrhosis Care:  -- Varices screen: 09/2015 EGD no varices (Normal platelet count and Fibroscan <20)  -- Mild Schatzki ring, nonbleeding gastric ulcers, gastritis   -- HCC screen: 09/15/2017 - Abdominal Ultrasound: Heterogeneous, hyperechoic liver parenchyma compatible with a history of chronic liver disease. No focal hepatic mass is identified.Spleen normal size. No evidence of ascites.  -- Vaccination: Immune to HAV; Hepatitis B vaccination series completed.   -- Bone Health: Bone density study ordered.   -- HCV RNA 328,448 iu/ml genotype  1a 01/2016   HCV RNA TW #5 - 05/21/2016 - not detected    HCV RNA 06/10/2016 - not detected.    HCV RNA Post TW #6 - 08/24/2016 - not detected   HCV RNA 10/28/2016 - not detected.     -- NS5A RAV testing 07/24/15 no resistance predicted  -- Fibroscan 04/2016 13 kPa/F4  -- Liver, percutaneous biopsy 05/08/2005:  Chronic hepatitis, hepatitis C by history, grade 2 stage 2  Minimal macrovesicular steatosis affecting <5% of liver parenchyma     REVIEW OF SYSTEMS:   All ten systems reviewed and negative except in HPI.     PAST MEDICAL HISTORY:    Past Medical History:   Diagnosis Date   ??? CKD (chronic kidney disease) stage 3, GFR 30-59 ml/min (CMS-HCC) 01/08/2015   ??? Coronary artery disease 10/2015    Multivessel disease, plan medical management after cath 6/17   ??? GERD (gastroesophageal reflux disease)    ??? Gout    ??? HIV (human immunodeficiency virus infection) (CMS-HCC)     Undectable viral load 5/17   ??? Hypertension    ??? Nephrolithiasis    ??? Nephrotic syndrome 03/05/2014   ??? Renal mass     Followed by neprhology, MRI 8/16 improved, felt to be benign       PAST SURGICAL HISTORY:    Past Surgical History:   Procedure Laterality Date   ??? BACK SURGERY      2012   ??? PR CATH PLACE/CORON ANGIO, IMG SUPER/INTERP,W LEFT HEART VENTRICULOGRAPHY N/A 11/13/2015    Procedure: Left Heart Catheterization;  Surgeon: Orpha Bur, MD;  Location[facility] CATH;  Service: Cardiology   ??? PR REMOVAL OF ANAL FISSURE N/A 06/29/2014    Procedure: FISSURECTOMY, INCLUDING SPHINCTEROTOMY, WHEN PERFORMED;  Surgeon: Romero Belling, MD;  Location: ASC OR [facility];  Service: Gastrointestinal   ??? PR SURG DIAGNOSTIC EXAM, ANORECTAL N/A 06/29/2014    Procedure: ANORECTAL EXAM, SURGICAL, REQUIRING ANESTHESIA (GENERAL, SPINAL, OR EPIDURAL), DIAGNOSTIC;  Surgeon: Romero Belling, MD;  Location: ASC OR [facility];  Service: Gastrointestinal       MEDICATIONS:  Current Outpatient Medications   Medication Sig Dispense Refill   ??? albuterol (PROVENTIL HFA;VENTOLIN HFA) 90 mcg/actuation inhaler Inhale 2 puffs.     ??? allopurinol (ZYLOPRIM) 100 MG tablet Take 2 tablets (200 mg total) by mouth daily. 180 tablet 3   ??? amLODIPine (NORVASC) 2.5 MG tablet Take 1 tablet (2.5 mg total) by mouth every morning before breakfast. 90 tablet 3   ??? aspirin (ECOTRIN) 81 MG tablet   0   ??? atorvastatin (LIPITOR) 20 MG tablet Take 1 tablet (20 mg total) by mouth daily. 90 tablet 3   ??? baclofen (LIORESAL) 10 MG tablet Take 1 tablet (10 mg total) by mouth Three (3) times a day as needed for muscle spasms. 30 tablet 2   ??? calcium carbonate-vitamin D2 500 mg(1,250mg ) -200 unit tablet Take 1 tablet by mouth Two (2) times a day.     ??? carvedilol (COREG) 12.5 MG tablet TAKE 1 TABLET BY MOUTH TWICE DAILY 60 tablet 9   ??? cloNIDine HCl (CATAPRES) 0.1 MG tablet Take 0.1 mg by mouth 3 (three) times a day.     ??? cranberry 400 mg cap Take 400 mg by mouth daily.     ??? cyanocobalamin 1000 MCG tablet Take 1,000 mcg by mouth.     ??? diazepam (VALIUM) 5 MG tablet Take 5 mg by mouth nightly.      ??? dolutegravir (TIVICAY)  50 mg Tab TABLET Take 1 tablet (50 mg total) by mouth daily. 30 tablet 11   ??? emtricitabine-tenofovir alafen (DESCOVY) 200-25 mg tablet Take 1 tablet by mouth daily. 30 tablet 11   ??? folic acid (FOLVITE) 1 MG tablet   0   ??? glimepiride (AMARYL) 1 MG tablet take 2 tablet by mouth once daily with BREAKFAST  0   ??? HYDROcodone-acetaminophen (NORCO) 5-325 mg per tablet Directions are take one tablet 30 minutes before physical therapy, then take one tablet at bedtime as needed for pain     ??? lidocaine (XYLOCAINE) 5 % ointment Apply 1 application topically daily as needed.     ??? losartan (COZAAR) 100 MG tablet Take 100 mg by mouth.     ??? naproxen (NAPROSYN) 500 MG tablet TK 1 T PO BID WF  0   ??? nitroglycerin (NITROSTAT) 0.4 MG SL tablet Place 1 tablet (0.4 mg total) under the tongue every five (5) minutes as needed for chest pain. 25 tablet 4   ??? nystatin (MYCOSTATIN) 100,000 unit/mL suspension Take 5 mL by mouth.     ??? omega-3 fatty acids-fish oil 340-1,000 mg capsule Take 1 capsule by mouth.     ??? omeprazole (PRILOSEC) 40 MG capsule Take 40 mg by mouth.     ??? ondansetron (ZOFRAN) 4 MG tablet take 1 tablet by mouth every 8 hours if needed for nausea     ??? pantoprazole (PROTONIX) 40 MG tablet Take 40 mg by mouth daily.     ??? sertraline (ZOLOFT) 50 MG tablet take 1 tablet by mouth once daily     ??? spironolactone (ALDACTONE) 25 MG tablet Take 1 tablet (25 mg total) by mouth daily. 30 tablet 0   ??? torsemide (DEMADEX) 20 MG tablet Take 2 tablets (40 mg total) by mouth daily. 90 tablet 3   ??? cefuroxime (CEFTIN) 250 MG tablet   0   ??? docusate sodium (COLACE) 100 MG capsule Take 1 capsule (100 mg total) by mouth every twelve (12) hours. (Patient not taking: Reported on 11/29/2017) 60 capsule 0   ??? ferrous sulfate 325 (65 FE) MG tablet Take 325 mg by mouth daily with breakfast.      ??? oxyCODONE-acetaminophen (PERCOCET) 5-325 mg per tablet Take 1 tablet by mouth.     ??? predniSONE (DELTASONE) 10 MG tablet Take one tablet daily by mouth for the next five days.     ??? tamsulosin (FLOMAX) 0.4 mg capsule Take 1 capsule (0.4 mg total) by mouth daily. (Patient not taking: Reported on 11/29/2017) 90 capsule 3   ??? tiZANidine (ZANAFLEX) 4 MG tablet Take 2 mg by mouth.       No current facility-administered medications for this visit.        ALLERGIES:    Colchicine analogues and Tramadol    SOCIAL HISTORY:    Social History     Socioeconomic History   ??? Marital status: Married     Spouse name: None   ??? Number of children: None   ??? Years of education: None   ??? Highest education level: None   Occupational History   ??? None   Social Needs   ??? Financial resource strain: None   ??? Food insecurity:     Worry: None     Inability: None   ??? Transportation needs:     Medical: None     Non-medical: None   Tobacco Use   ??? Smoking status: Former Smoker  Packs/day: 1.00     Years: 10.00     Pack years: 10.00     Last attempt to quit: 03/11/1992     Years since quitting: 25.7   ??? Smokeless tobacco: Never Used   Substance and Sexual Activity   ??? Alcohol use: No     Alcohol/week: 0.0 oz   ??? Drug use: No     Comment: Prior heroin use   ??? Sexual activity: None   Lifestyle   ??? Physical activity:     Days per week: None     Minutes per session: None   ??? Stress: None   Relationships   ??? Social connections:     Talks on phone: None     Gets together: None     Attends religious service: None     Active member of club or organization: None     Attends meetings of clubs or organizations: None     Relationship status: None   Other Topics Concern   ??? None   Social History Narrative   ??? None       FAMILY HISTORY:    family history includes Cancer in his father and mother; Diabetes in his brother; Heart disease (age of onset: 32) in his sister; Hypertension in his mother and sister; Kidney disease in his father; Thyroid disease in his mother.      VITAL SIGNS:    BP 164/90  - Pulse 61  - Temp 36.6 ??C (97.9 ??F) (Oral)  - Resp 16  - Ht 180.4 cm (5' 11.01)  - Wt (!) 104.3 kg (229 lb 14.4 oz)  - SpO2 98%  - BMI 32.06 kg/m??   Body mass index is 32.06 kg/m??.    PHYSICAL EXAM:  General appearance - Alert, well appearing, and in no distress, oriented to person, place, and time and morbidly obese   Mental status - alert, oriented to person, place, and time, normal mood, behavior, speech, dress, motor activity, and thought processes  Lymphatics - no palpable lymphadenopathy  Chest - clear to auscultation, no wheezes, rales or rhonchi, symmetric air entry  Heart - normal rate, regular rhythm, normal S1, S2, no murmurs, rubs, clicks or gallops  Abdomen - soft, nontender, nondistended, no masses or organomegaly  Neurological - alert, oriented, normal speech, no focal findings or movement disorder noted, screening mental status exam normal  Musculoskeletal - no joint tenderness, deformity or swelling  Extremities - peripheral pulses normal, no pedal edema, no clubbing or cyanosis  Skin - normal coloration and turgor, no rashes, no suspicious skin lesions noted. No evidence of palmar erythema or spider angiomas.     Laboratory results:  Results for orders placed or performed in visit on 11/29/17   Vitamin D 25-OH   Result Value Ref Range    Vitamin D Total (25OH) 32.4 20.0 - 80.0 ng/mL   PT-INR   Result Value Ref Range    PT 12.2 10.2 - 12.8 sec    INR 1.07    AFP non-maternal tumor marker   Result Value Ref Range    AFP-Tumor Marker 3 <8 ng/mL Hepatic Function Panel   Result Value Ref Range    Albumin 4.4 3.5 - 5.0 g/dL    Total Protein 7.7 6.5 - 8.3 g/dL    Total Bilirubin 1.6 (H) 0.0 - 1.2 mg/dL    Bilirubin, Direct 1.66 0.00 - 0.40 mg/dL    AST 38 19 - 55 U/L    ALT 36 19 -  72 U/L    Alkaline Phosphatase 89 38 - 126 U/L   Hepatitis C RNA, Quantitative, PCR   Result Value Ref Range    HCV RNA Not Detected     HCV RNA (IU)  <=0 IU/mL    HCV RNA Log(10)  <0.00 log IU/mL    HCV RNA Comment       Hepatitis C virus RNA quantification is performed using the FDA-approved Abbott RealTime PCR HCV test, targeting the 5' UTR. This test can quantify HCV RNA over the range of 12-100,000,000 IU/mL (1.08 log(10) - 8.0 log(10) IU/mL). Both the limit of detection and limit of quantification are 12 IU/mL. The reference range for this assay is Not Detected.      ASSESSMENT/PLAN:  1. Well compensated cirrhosis based on fibrosis score~ secondary to HCV:  Fibroscan - score: 13 kPa/early F4.      Brodin Emilio Aspen is a 67 y.o. male with past medical history of HIV, CKD, CAD c/b NSTEMI 10/2015, and well compensated cirrhosis based on fibrosis score.  Cirrhosis secondary to chronic HCV.  He has continued to do well from hepatic standpoint.  Despite diagnosis of cirrhosis, patient has remained clinically compensated with intact synthetic function. He is at risk for progression of his cirrhosis with risk factors for ongoing liver disease including HCV, HIV, and obesity. HCV has been successfully treated and cured s/p Zepatier x 12 weeks.     ~ Cirrhosis care labs ordered.   ~ Office follow up in six months, sooner if any concerns.   ~ Instructed healthy diet, regular exercise program and weight management with goal of ten pound weight loss by next office visit.    2.  HCV treatment~ Successfully treated s/p twelve weeks of Zepatier.     3.  History of CAD ~ NSTEMI 2017. Under the care of [facility] cardiology.     4.  HCC screening: Up to date. Abdominal ultrasound ordered for same date of next office visit.     5.  Alcohol assessment: No alcohol use.   AUDIT score = zero.     6.  Osteoporosis screening: Vitamin D level ordered. Address next visit proceeding with bone density study.      7. Variceal screening: Not warranted at this time.     8. GERD: Start Omeprazole 20 mg once daily 30-60 minutes prior to breakfast. Reflux patient education  information given in writing today. If reflux/abdominal pain does not seem to improve over the next couple of weeks, patient instructed to follow up with Dr. Lynnae Prude.      All patient's questions were answered were answered to his satisfaction during office visit today.     Rodman Key, DNP, FNP-BC  [facility]  [address]  [place] [address]  Phone [phone]

## 2017-11-30 NOTE — Unmapped (Signed)
Assessment/Plan:            .   HIV:  Viral load well controlled on current ART (Descovy/dolutegravir), which he tolerates well. We discussed labs.  Will continue current regimen. Cirrhosis and renal insufficiency are his biggest current medical problems.    Obesity: Nutrition counseling requested.    S/p L shoulder injury: MRI pending per pt. Followed by his PCP.    Cirrhosis: Followed by GI.     SVR following Zepatier for Chronic HCV Infection (genotype 1a): Stable.    CKD: Stage 3 -- Followed by Dr Stefano Gaul.    HTN: Stable.    Sexual Health:     Mental Health: Stable.    Health maintenance:   Pap N/A  RPR False + (1:2 with neg FTA 11/12/2015)  GC/CT - neg 08/26/2016  UA 01/10/2016: 30 mg protein protein/creat ratio 0.162  Hb A1C 11/12/2015: 5.7   Cholesterol 11/12/2015: total cholesterol 148, HDL 37, LDL (calc) 80, triglycerides 155  TB screening neg, 06/10/2016  Lung cancer screening N/A (quit 1993 after 1 ppd x 10 yrs)  Colonoscopy - Requested for screening    Pneumococcal and second Shingrix vaccine requested today.        Prevention, adherence and health education:   .  .  .  .  .  .  .    Educational and counseling services took 25  minutes of today's visit.    Follow-up:  Return to clinic in 1 month or sooner if needed.   Subjective:    Samuel [name] is a 67 y.o. male who presents to the Infectious Disease clinic for return HIV visit.     Chief Complaint: HIV Positive/AIDS and Routine Follow-up      HPI:   HIV diagnosed in 12/1999. In 0120/02, CD4 count was 547 (25%) with a viral load of about 5293. For a number of years, his viral load and CD4 count were generally reasonably well maintained in the absence of antiretroviral therapy, and he never had any HIV-related symptoms. However, during 2008-2009, his viral load dramatically increased and his CD4 count fell. Viral load reached a height of 309,000 on 08/31/2007 and CD4 count fell substantially to 244 on 09/29/2007. He had always been reluctant to take antiviral medications and in fact consistently and repeatedly refused hepatitis C treatment as well as HIV treatment in the past. However, because of the deterioration of his CD4 count and viral load, I prescribed Atripla on 10/12/07. Although he agreed to take it he did not done so until more than a year later, despite assurances during each visit that he would begin ART immediately. Once he finally started ART, however, he tolerated it quite well. On 07/24/2015 he was switched from Atripla to Descovy/dolutegravir b/o CKD.     His HIV has been well controlled, but he has multiple severe comorbidities, including HCV (SVR after 12 wks of Zepatier 04/20/2016-07/13/2016; HCV RNA BDL 10/28/2016), resultant cirrhosis (Metavir F4), CKD Stage 3, NSTEMI 10/2015. He is followed closely by Nephrology, GI, and Cardiology. His course has been eventful during the past few months.     Fell in October and injured L shoulder. Underwent L rotator cuff repair in 07/2017. Has generally been doing well. No hospitalizations since I last saw him.    Has missed only one dose since I last saw him several months ago (fell asleep).    ROS otherwise neg.       Past Medical History:   Diagnosis Date   ???  CKD (chronic kidney disease) stage 3, GFR 30-59 ml/min (CMS-HCC) 01/08/2015   ??? Coronary artery disease 10/2015    Multivessel disease, plan medical management after cath 6/17   ??? GERD (gastroesophageal reflux disease)    ??? Gout    ??? HIV (human immunodeficiency virus infection) (CMS-HCC)     Undectable viral load 5/17   ??? Hypertension    ??? Nephrolithiasis    ??? Nephrotic syndrome 03/05/2014   ??? Renal mass     Followed by neprhology, MRI 8/16 improved, felt to be benign       Medications:  Current Outpatient Medications   Medication Sig Dispense Refill   ??? allopurinol (ZYLOPRIM) 100 MG tablet Take 2 tablets (200 mg total) by mouth daily. 180 tablet 3   ??? amLODIPine (NORVASC) 2.5 MG tablet Take 1 tablet (2.5 mg total) by mouth every morning before breakfast. 90 tablet 3   ??? aspirin (ECOTRIN) 81 MG tablet   0   ??? atorvastatin (LIPITOR) 20 MG tablet Take 1 tablet (20 mg total) by mouth daily. 90 tablet 3   ??? baclofen (LIORESAL) 10 MG tablet Take 1 tablet (10 mg total) by mouth Three (3) times a day as needed for muscle spasms. 30 tablet 2   ??? calcium carbonate-vitamin D2 500 mg(1,250mg ) -200 unit tablet Take 1 tablet by mouth Two (2) times a day.     ??? carvedilol (COREG) 12.5 MG tablet TAKE 1 TABLET BY MOUTH TWICE DAILY 60 tablet 9   ??? cefuroxime (CEFTIN) 250 MG tablet   0   ??? cloNIDine HCl (CATAPRES) 0.1 MG tablet Take 0.1 mg by mouth 3 (three) times a day.     ??? cranberry 400 mg cap Take 400 mg by mouth daily.     ??? cyanocobalamin 1000 MCG tablet Take 1,000 mcg by mouth.     ??? diazepam (VALIUM) 5 MG tablet Take 5 mg by mouth nightly.      ??? docusate sodium (COLACE) 100 MG capsule Take 1 capsule (100 mg total) by mouth every twelve (12) hours. 60 capsule 0   ??? dolutegravir (TIVICAY) 50 mg Tab TABLET Take 1 tablet (50 mg total) by mouth daily. 30 tablet 11   ??? emtricitabine-tenofovir alafen (DESCOVY) 200-25 mg tablet Take 1 tablet by mouth daily. 30 tablet 11   ??? folic acid (FOLVITE) 1 MG tablet   0   ??? glimepiride (AMARYL) 1 MG tablet take 2 tablet by mouth once daily with BREAKFAST  0   ??? HYDROcodone-acetaminophen (NORCO) 5-325 mg per tablet Directions are take one tablet 30 minutes before physical therapy, then take one tablet at bedtime as needed for pain     ??? lidocaine (XYLOCAINE) 5 % ointment Apply 1 application topically daily as needed.     ??? losartan (COZAAR) 100 MG tablet Take 100 mg by mouth.     ??? naproxen (NAPROSYN) 500 MG tablet TK 1 T PO BID WF  0   ??? nitroglycerin (NITROSTAT) 0.4 MG SL tablet Place 1 tablet (0.4 mg total) under the tongue every five (5) minutes as needed for chest pain. 25 tablet 4   ??? omeprazole (PRILOSEC) 20 MG capsule Take 1 capsule (20 mg total) by mouth daily. 90 capsule 3   ??? ondansetron (ZOFRAN) 4 MG tablet take 1 tablet by mouth every 8 hours if needed for nausea     ??? oxyCODONE-acetaminophen (PERCOCET) 5-325 mg per tablet Take 1 tablet by mouth.     ??? sertraline (ZOLOFT) 50  MG tablet take 1 tablet by mouth once daily     ??? spironolactone (ALDACTONE) 25 MG tablet Take 1 tablet (25 mg total) by mouth daily. 30 tablet 0   ??? tiZANidine (ZANAFLEX) 4 MG tablet Take 2 mg by mouth.     ??? albuterol (PROVENTIL HFA;VENTOLIN HFA) 90 mcg/actuation inhaler Inhale 2 puffs.     ??? ferrous sulfate 325 (65 FE) MG tablet Take 325 mg by mouth daily with breakfast.      ??? nystatin (MYCOSTATIN) 100,000 unit/mL suspension Take 5 mL by mouth.     ??? omega-3 fatty acids-fish oil 340-1,000 mg capsule Take 1 capsule by mouth.     ??? predniSONE (DELTASONE) 10 MG tablet Take one tablet daily by mouth for the next five days.     ??? tamsulosin (FLOMAX) 0.4 mg capsule Take 1 capsule (0.4 mg total) by mouth daily. (Patient not taking: Reported on 11/29/2017) 90 capsule 3   ??? torsemide (DEMADEX) 20 MG tablet Take 2 tablets (40 mg total) by mouth daily. 90 tablet 3     No current facility-administered medications for this visit.        Allergies: Colchicine analogues and Tramadol    Social History:  As per MEDICAL RECORD NUMBERNo cigs or alcohol at all. No current IDU (quit many years ago). Lives with wife.     Review of Systems:  A 12 point review of systems was negative except for pertinent items noted in the HPI.    Objective:       BP 115/76  - Pulse 61  - Temp 36.8 ??C (98.2 ??F) (Oral)  - Ht 180.4 cm (5' 11.02)  - Wt (!) 104.4 kg (230 lb 1.6 oz)  - BMI 32.07 kg/m??     GEN:  looks well, no apparent distress  EYES: sclerae anicteric and non injected  ZOX:WRUEAVWU on top and bottom. No lesions.  LYMPH:no cervical or supraclavicular LAD  CV:no peripheral edema, normal pulses. Nl s1, s2, without s3, s4 +2/6 sys m diffusely over precordium (except no radiation to neck or axilla)  PULM:CTAB ant/post, normal work of breathing  ABD: Protuberant - but not taut, nontender, no masses felt.  JW:JXBJYNWG  RECTAL:deferred  SKIN:no petechiae, ecchymoses or obvious rashes on clothed exam  MSK: L shoulder abduction limited by pain; 1+ pitting edema bilat  NEURO:CN II-XII intact  PSYCH:attentive, appropriate affect, good eye contact, fluent speech    Recent Labs:    Lab Results   Component Value Date    RPR Reactive (A) 11/12/2015    A1C 5.7 11/12/2015    CHOL 111 09/16/2016    CHOL 148 11/12/2015    CHOL 120 07/24/2015    LDL 39 (L) 09/16/2016    LDL 80 11/12/2015    LDL 46 (L) 07/24/2015    HDL 32 (L) 09/16/2016    HDL 37 (L) 11/12/2015    HDL 52 07/24/2015    TRIG 956 (H) 09/16/2016    TRIG 155 (H) 11/12/2015    TRIG 111 07/24/2015    CHOLHDLRATIO 3.5 09/16/2016    CHOLHDLRATIO 4.0 11/12/2015    CHOLHDLRATIO 2.3 07/24/2015    QFTTBGOLD Negative 06/10/2016         Absolute CD4 Count   Date Value Ref Range Status   09/01/2017 417 (L) 510-2,320 /uL Final   04/02/2017 290 (L) 510-2,320 /uL Final   09/16/2016 547 510 - 2,320 /uL Final   03/11/2016 551 510 - 2,320 /uL Final   08/01/2014 514  510 - 2,320 /uL Final   01/03/2014 430 (L) 510 - 2,320 /uL Final   11/23/2012 549 510 - 2,320 /uL Final   07/20/2012 470 (L) 510 - 2,320 CELLS/UL Final     CD4% (T Helper)   Date Value Ref Range Status   09/01/2017 27 (L) 34 - 58 % Final   04/02/2017 29 (L) 34 - 58 % Final   09/16/2016 28 (L) 34 - 58 % Final   03/11/2016 29 (L) 34 - 58 % Final   08/01/2014 35 34 - 58 % Final   01/03/2014 34 34 - 58 % Final   11/23/2012 31 (L) 34 - 58 % Final   07/20/2012 29 (L) 34 - 58 % OF LYMPHS Final     HIV RNA Quant Result   Date Value Ref Range Status   09/01/2017 Detected (A) Not Detected Final   04/02/2017 Detected (A) Not Detected Final   09/16/2016 Detected (A) Not Detected Final   06/10/2016 Not Detected Not Detected Final   08/15/2014 Not Detected  Final   01/03/2014 Not Detected  Final   11/23/2012 Not Detected  Final   07/20/2012 Not Detected  Final     HIV RNA   Date Value Ref Range Status   09/01/2017 <40 (H) <0 copies/mL Final   04/02/2017 <40 (H) <0 copies/mL Final   09/16/2016 <40 (H) <0 copies/mL Final   03/11/2016 <40 (H) <0 copies/mL Final     HIV RNA Log(10)   Date Value Ref Range Status   09/01/2017  <0.00 log copies/mL Final     Comment:     <1.6 log   04/02/2017  <0.00 log copies/mL Final     Comment:     <1.6 log   09/16/2016  <0.00 log copies/mL Final     Comment:     <1.6 log   03/11/2016  <0.00 log copies/mL Final     Comment:     <1.6 log         Absolute CD4 Count   Date Value Ref Range Status   09/01/2017 417 (L) 510-2,320 /uL Final   04/02/2017 290 (L) 510-2,320 /uL Final   09/16/2016 547 510 - 2,320 /uL Final   03/11/2016 551 510 - 2,320 /uL Final   08/01/2014 514 510 - 2,320 /uL Final   01/03/2014 430 (L) 510 - 2,320 /uL Final   11/23/2012 549 510 - 2,320 /uL Final   07/20/2012 470 (L) 510 - 2,320 CELLS/UL Final     HIV RNA   Date Value Ref Range Status   09/01/2017 <40 (H) <0 copies/mL Final   04/02/2017 <40 (H) <0 copies/mL Final   09/16/2016 <40 (H) <0 copies/mL Final   03/11/2016 <40 (H) <0 copies/mL Final      Lab Results   Component Value Date    WBC 9.3 11/02/2017    WBC 8.4 08/01/2014    HGB 14.1 11/02/2017    HGB 11.1 (L) 08/01/2014    PLT 180 11/02/2017    PLT 181 08/01/2014    CREATININE 1.49 (H) 11/02/2017    CREATININE 1.68 (H) 08/15/2014    AST 38 11/29/2017    AST 50 08/01/2014    ALT 36 11/29/2017    ALT 60 08/01/2014    TRIG 198 (H) 09/16/2016    TRIG 80 01/03/2014    HDL 32 (L) 09/16/2016    HDL 53 01/03/2014    LDL 39 (L) 09/16/2016    LDL 71 01/03/2014  Immunization History   Administered Date(s) Administered   ??? Hepatitis B, Adult 04/06/2016, 05/21/2016, 08/24/2016   ??? INFLUENZA TIV (TRI) PF (IM) 02/08/2008, 03/26/2010, 03/25/2011   ??? Influenza Vaccine Quad (IIV4 PF) 38mo+ injectable 07/24/2015, 02/05/2016   ??? Influenza Virus Vaccine, unspecified formulation 06/15/2014, 07/24/2015, 02/05/2016, 03/15/2016, 03/31/2017   ??? PNEUMOCOCCAL POLYSACCHARIDE 23 07/13/2000, 08/19/2005   ??? PPD Test 12/29/2006, 07/11/2008, 03/26/2010, 03/25/2011   ??? Pneumococcal Conjugate 13-Valent 04/13/2012   ??? SHINGRIX-ZOSTER VACCINE (HZV), RECOMBINANT,SUB-UNIT,ADJUVANTED IM 09/16/2016   ??? TdaP 08/31/2007

## 2017-12-01 ENCOUNTER — Ambulatory Visit
Admit: 2017-12-01 | Discharge: 2017-12-02 | Payer: MEDICARE | Attending: Infectious Disease | Primary: Infectious Disease

## 2017-12-01 DIAGNOSIS — B182 Chronic viral hepatitis C: Secondary | ICD-10-CM

## 2017-12-01 DIAGNOSIS — B20 Human immunodeficiency virus [HIV] disease: Secondary | ICD-10-CM

## 2017-12-01 DIAGNOSIS — K7689 Other specified diseases of liver: Secondary | ICD-10-CM

## 2017-12-01 DIAGNOSIS — I10 Essential (primary) hypertension: Secondary | ICD-10-CM

## 2017-12-01 DIAGNOSIS — Z23 Encounter for immunization: Secondary | ICD-10-CM

## 2017-12-01 DIAGNOSIS — N183 Chronic kidney disease, stage 3 (moderate): Secondary | ICD-10-CM

## 2017-12-01 LAB — HCV RNA LOG10: Lab: 0

## 2017-12-01 LAB — ALT (SGPT): Alanine aminotransferase:CCnc:Pt:Ser/Plas:Qn:: 46

## 2017-12-01 LAB — CBC W/ AUTO DIFF
BASOPHILS ABSOLUTE COUNT: 0 10*9/L (ref 0.0–0.1)
BASOPHILS RELATIVE PERCENT: 0.3 %
EOSINOPHILS ABSOLUTE COUNT: 0.1 10*9/L (ref 0.0–0.4)
EOSINOPHILS RELATIVE PERCENT: 1.1 %
HEMATOCRIT: 44.8 % (ref 41.0–53.0)
HEMOGLOBIN: 14.4 g/dL (ref 13.5–17.5)
LARGE UNSTAINED CELLS: 2 % (ref 0–4)
LYMPHOCYTES ABSOLUTE COUNT: 2 10*9/L (ref 1.5–5.0)
LYMPHOCYTES RELATIVE PERCENT: 21.4 %
MEAN CORPUSCULAR HEMOGLOBIN CONC: 32.2 g/dL (ref 31.0–37.0)
MEAN CORPUSCULAR VOLUME: 93.9 fL (ref 80.0–100.0)
MEAN PLATELET VOLUME: 8.6 fL (ref 7.0–10.0)
MONOCYTES ABSOLUTE COUNT: 0.5 10*9/L (ref 0.2–0.8)
MONOCYTES RELATIVE PERCENT: 5.9 %
NEUTROPHILS ABSOLUTE COUNT: 6.4 10*9/L (ref 2.0–7.5)
NEUTROPHILS RELATIVE PERCENT: 69.8 %
PLATELET COUNT: 187 10*9/L (ref 150–440)
RED CELL DISTRIBUTION WIDTH: 14.1 % (ref 12.0–15.0)
WBC ADJUSTED: 9.1 10*9/L (ref 4.5–11.0)

## 2017-12-01 LAB — AST (SGOT): Aspartate aminotransferase:CCnc:Pt:Ser/Plas:Qn:: 44

## 2017-12-01 LAB — CREATININE
Creatinine:MCnc:Pt:Ser/Plas:Qn:: 1.73 — ABNORMAL HIGH
EGFR CKD-EPI NON-AA MALE: 41 mL/min/{1.73_m2} — ABNORMAL LOW (ref >=60)

## 2017-12-01 LAB — EOSINOPHILS RELATIVE PERCENT: Lab: 1.1

## 2017-12-01 LAB — BILIRUBIN TOTAL: Bilirubin:MCnc:Pt:Ser/Plas:Qn:: 1.7 — ABNORMAL HIGH

## 2017-12-01 LAB — HEPATITIS C RNA, QUANTITATIVE, PCR: HCV RNA: NOT DETECTED

## 2017-12-01 LAB — GLUCOSE, RANDOM: GLUCOSE RANDOM: 153 mg/dL (ref 65–179)

## 2017-12-01 LAB — GLUCOSE RANDOM: Glucose:MCnc:Pt:Ser/Plas:Qn:: 153

## 2017-12-01 MED ORDER — DOLUTEGRAVIR 50 MG TABLET: 50 mg | tablet | Freq: Every day | 11 refills | 0 days | Status: AC

## 2017-12-01 MED ORDER — DOLUTEGRAVIR 50 MG TABLET
ORAL_TABLET | Freq: Every day | ORAL | 11 refills | 0.00000 days | Status: CP
Start: 2017-12-01 — End: 2017-12-01
  Filled 2018-02-07: qty 30, 30d supply, fill #0

## 2017-12-01 MED ORDER — EMTRICITABINE 200 MG-TENOFOVIR ALAFENAMIDE FUMARATE 25 MG TABLET
ORAL_TABLET | Freq: Every day | ORAL | 11 refills | 0.00000 days | Status: CP
Start: 2017-12-01 — End: 2018-09-07

## 2017-12-01 MED ORDER — EMTRICITABINE 200 MG-TENOFOVIR ALAFENAMIDE FUMARATE 25 MG TABLET: 1 | tablet | 11 refills | 0 days | Status: AC

## 2017-12-06 LAB — HIV RNA, QUANTITATIVE, PCR
HIV RNA QNT RSLT: DETECTED — AB
HIV RNA: <40 {copies}/mL — ABNORMAL HIGH (ref <0)

## 2017-12-06 LAB — HIV RNA LOG(10): Lab: 0

## 2017-12-07 NOTE — Unmapped (Signed)
Met w/ pt today in clinic to start RW paperwork. Application is incomplete due to pts income statement listed below is needed. Provided pt w/ a business reply envelope in which pt stated he would mail back income statement. Pt indicated he is not having issues accessing medications at this time. Note expires 08/30/2018.     1. 2019 SSI income statement    Samuel [name]  Time Duration of intervention in minutes: 5 mins

## 2017-12-08 NOTE — Unmapped (Signed)
Duration of Intervention: 5 minutes    Texting Agreement __x_ Agreed   ____ Refused : Expiration 12/02/2022    SW texted pt on [facility] cell phone to inform of texting number.    Bradly Bienenstock LCSWA, CHES

## 2017-12-09 NOTE — Unmapped (Signed)
[facility] Refill Coordination Note  Specialty Medication(s): TIVICAY  DESCOVY      [name], DOB: [birth year]  Phone: [phone] (home) , Alternate phone contact: N/A  Phone or address changes today?: No  All above HIPAA information was verified with patient.  Shipping Address: [address]  [place] [address]   Insurance changes? No    Completed refill call assessment today to schedule patient's medication shipment from the [facility] [phone]).      Confirmed the medication and dosage are correct and have not changed: Yes, regimen is correct and unchanged.    Confirmed patient started or stopped the following medications in the past month:  No, there are no changes reported at this time.    Are you tolerating your medication?:  Branon reports tolerating the medication.    ADHERENCE        Did you miss any doses in the past 4 weeks? No missed doses reported.    FINANCIAL/SHIPPING    Delivery Scheduled: Yes, Expected medication delivery date: 7/16 VIA WFD     The patient will receive an FSI print out for each medication shipped and additional FDA Medication Guides as required.  Patient education from [facility] or Robet Leu may also be included in the shipment    Dragon did not have any additional questions at this time.    Delivery address validated in FSI scheduling system: Yes, address listed in FSI is correct.    We will follow up with patient monthly for standard refill processing and delivery.      Thank you,  [name]   [facility] Shared [facility] Specialty Technician

## 2017-12-13 MED FILL — DESCOVY/200MG/25MG/TABS: DESCOVY/200MG/25MG/TABS | 30 days supply | Qty: 30 | Fill #2

## 2017-12-13 MED FILL — TIVICAY/50MG/TAB: TIVICAY/50MG/TAB | 30 days supply | Qty: 30 | Fill #2

## 2017-12-23 ENCOUNTER — Ambulatory Visit: Admit: 2017-12-23 | Discharge: 2017-12-24 | Payer: MEDICARE

## 2017-12-23 DIAGNOSIS — K7460 Unspecified cirrhosis of liver: Secondary | ICD-10-CM

## 2017-12-23 DIAGNOSIS — R1084 Generalized abdominal pain: Principal | ICD-10-CM

## 2017-12-24 NOTE — Unmapped (Addendum)
Left message on machine to call back  

## 2017-12-28 NOTE — Unmapped (Addendum)
[name] returned by call. Let him know his recent abd ultrasound results were unremarkable. There are two renal stones present but are not blocking the path. Reminded patient he has appointment with Nephrology 02/08/18 @ 0900. Patient repeated the information and verbalizes understanding.     ----- Message from [facility] [place] sent at 12/24/2017 11:45 AM EDT -----  Regarding: abdominal U/S   Please let [name] know abdominal ultrasound findings are stable. No worrisome findings noted. He does have bilateral renal stones, but non obstructive.     Dawn

## 2018-01-06 MED FILL — TIVICAY/50MG/TAB: TIVICAY/50MG/TAB | 30 days supply | Qty: 30 | Fill #3

## 2018-01-06 MED FILL — DESCOVY/200MG/25MG/TABS: DESCOVY/200MG/25MG/TABS | 30 days supply | Qty: 30 | Fill #3

## 2018-01-07 NOTE — Unmapped (Signed)
[facility] Refill Coordination Note  Specialty Medication(s): [place], [id number]  Additional Medications shipped: na    Samuel [name], DOB: 03/19/1951  Phone: [phone] (home) , Alternate phone contact: N/A  Phone or address changes today?: No  All above HIPAA information was verified with patient.  Shipping Address: [address]  [place] [address]   Insurance changes? No    Completed refill call assessment today to schedule patient's medication shipment from the [facility] [phone]).      Confirmed the medication and dosage are correct and have not changed: Yes, regimen is correct and unchanged.    Confirmed patient started or stopped the following medications in the past month:  No, there are no changes reported at this time.    Are you tolerating your medication?:  Samuel [name] reports tolerating the medication.    ADHERENCE  Did you miss any doses in the past 4 weeks? No missed doses reported.    FINANCIAL/SHIPPING    Delivery Scheduled: Yes, Expected medication delivery date: Friday, Aug 9      The patient will receive a drug information handout for each medication shipped and additional FDA Medication Guides as required.      Samuel [name] did not have any additional questions at this time.    Delivery address validated in Epic.    We will follow up with patient monthly for standard refill processing and delivery.      Thank you,  Tawanna Solo Shared [facility] Specialty Pharmacist

## 2018-02-04 NOTE — Unmapped (Signed)
[facility] Refill and Clinical Coordination Note  Medication(s): Tivicay 50mg , Descovy 200-25mg     Samuel [name], DOB: 03-19-1951  Phone: [phone] (home) , Alternate phone contact: N/A  Shipping address: [address]  [place] [address]  Phone or address changes today?: No  All above HIPAA information verified.  Insurance changes? No    Completed refill and clinical call assessment today to schedule patient's medication shipment from the [facility] [phone]).      MEDICATION RECONCILIATION    Confirmed the medication and dosage are correct and have not changed: Yes, regimen is correct and unchanged.    Were there any changes to your medication(s) in the past month:  No, there are no changes reported at this time.    ADHERENCE    Is this medicine transplant or covered by Medicare Part B? No.    Did you miss any doses in the past 4 weeks? No missed doses reported.  Adherence counseling provided? Not needed     SIDE EFFECT MANAGEMENT    Are you tolerating your medication?:  Samuel [name] reports tolerating the medication.  Side effect management discussed: None      Therapy is appropriate and should be continued.    Evidence of clinical benefit: See Epic note from 12/01/17      FINANCIAL/SHIPPING    Delivery Scheduled: Yes, Expected medication delivery date: 02/08/18     Additional medications refilled: No additional medications/refills needed at this time.    The patient will receive a drug information handout for each medication shipped and additional FDA Medication Guides as required.      Samuel [name] did not have any additional questions at this time.    Delivery address confirmed in Epic.     We will follow up with patient monthly for standard refill processing and delivery.      Thank you,  Samuel [name]   [facility] Specialty Pharmacist

## 2018-02-07 MED FILL — DESCOVY 200 MG-25 MG TABLET: 30 days supply | Qty: 30 | Fill #0 | Status: AC

## 2018-02-07 MED FILL — TIVICAY 50 MG TABLET: 30 days supply | Qty: 30 | Fill #0 | Status: AC

## 2018-02-07 MED FILL — DESCOVY 200 MG-25 MG TABLET: ORAL | 30 days supply | Qty: 30 | Fill #0

## 2018-02-24 NOTE — Unmapped (Signed)
Called pt at number located in epic due to as of today pt has not submitted income statement needed to complete RW paperwork. Rcvd pts vm. Left discreet message w/ my direct contact info to return call.     1. 2019 SSI lttr     Samuel [name]  Time Duration of intervention in minutes: 5 mins

## 2018-03-02 NOTE — Unmapped (Signed)
[facility] Refill Coordination Note  Specialty Medication(s): TIVICAY  DESCOVY  ATORVASTATIN      Samuel [name], DOB: 03-Nov-1950  Phone: [phone] (home) , Alternate phone contact: N/A  Phone or address changes today?: No  All above HIPAA information was verified with patient.  Shipping Address: [address]  [place] [address]   Insurance changes? No    Completed refill call assessment today to schedule patient's medication shipment from the [facility] [phone]).      Confirmed the medication and dosage are correct and have not changed: Yes, regimen is correct and unchanged.    Confirmed patient started or stopped the following medications in the past month:  No, there are no changes reported at this time.    Are you tolerating your medication?:  Samuel [name] reports tolerating the medication.    ADHERENCE          Did you miss any doses in the past 4 weeks? No missed doses reported.    FINANCIAL/SHIPPING    Delivery Scheduled: Yes, Expected medication delivery date: 10/8 VIA WFD [place]     The patient will receive a drug information handout for each medication shipped and additional FDA Medication Guides as required.      Samuel [name] did not have any additional questions at this time.    Delivery address validated in Epic.    We will follow up with patient monthly for standard refill processing and delivery.      Thank you,  [name]   [facility] Shared [facility] Specialty Technician

## 2018-03-05 IMAGING — CR DG CHEST 2V
1 series · 2 of 2 positions shown · non-contrast
Comparison: 01/18/2015

CLINICAL DATA: "I'm retaining a lot of fluid and having difficulty
breathing" Onset of symptoms [REDACTED]. States had a heart attack about
one month ago. Also c/o intermittent chest pain. Patient goes to
[employer] and noticed a 2 pound weight gain since yesterday.
Hx of CHF, HTN, aortic stenosis, CAD

EXAM:
CHEST  2 VIEW

[Series 1: dg chest 2 view · 0.14mm/px · 2 of 2 slices shown]
[im 1/2]
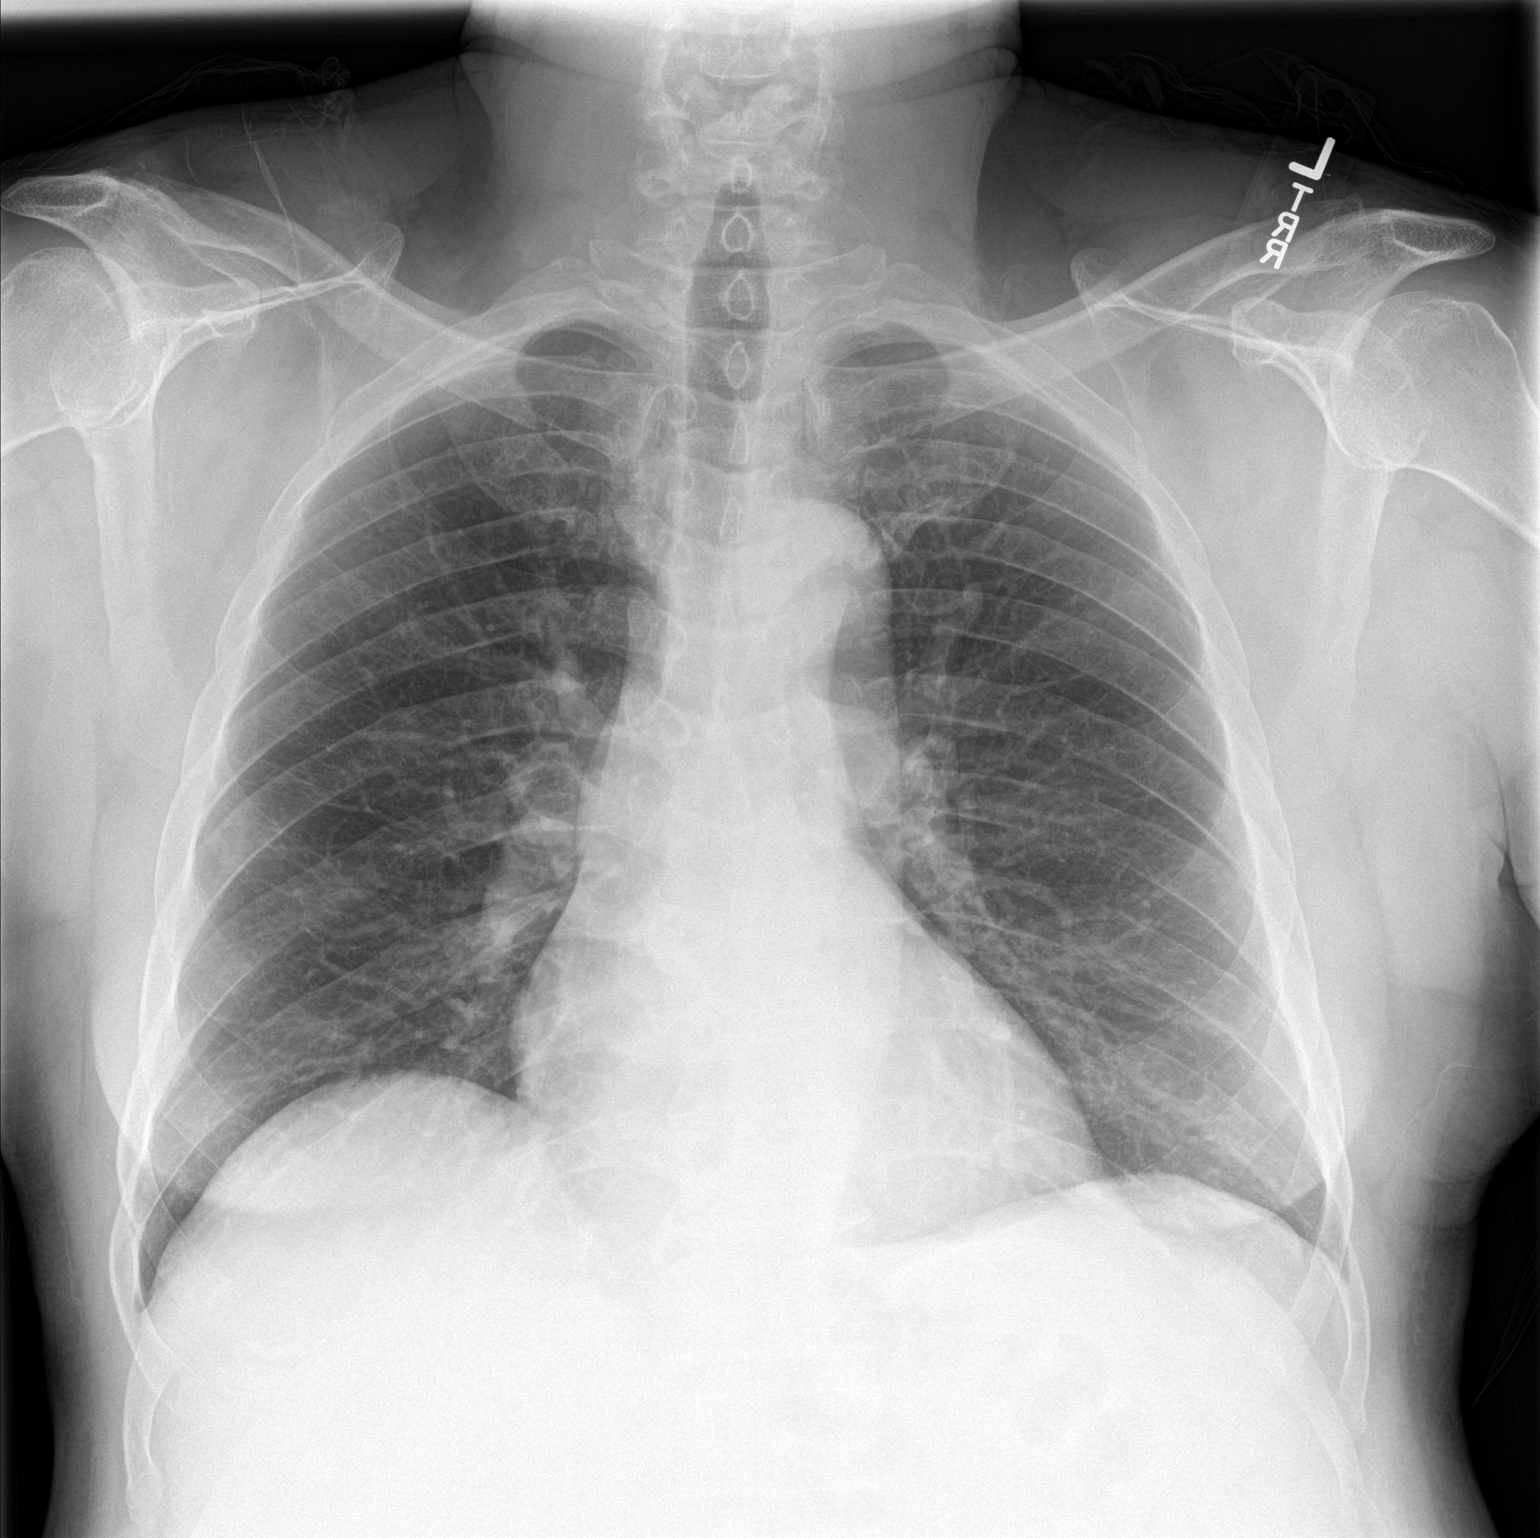
[im 2/2]
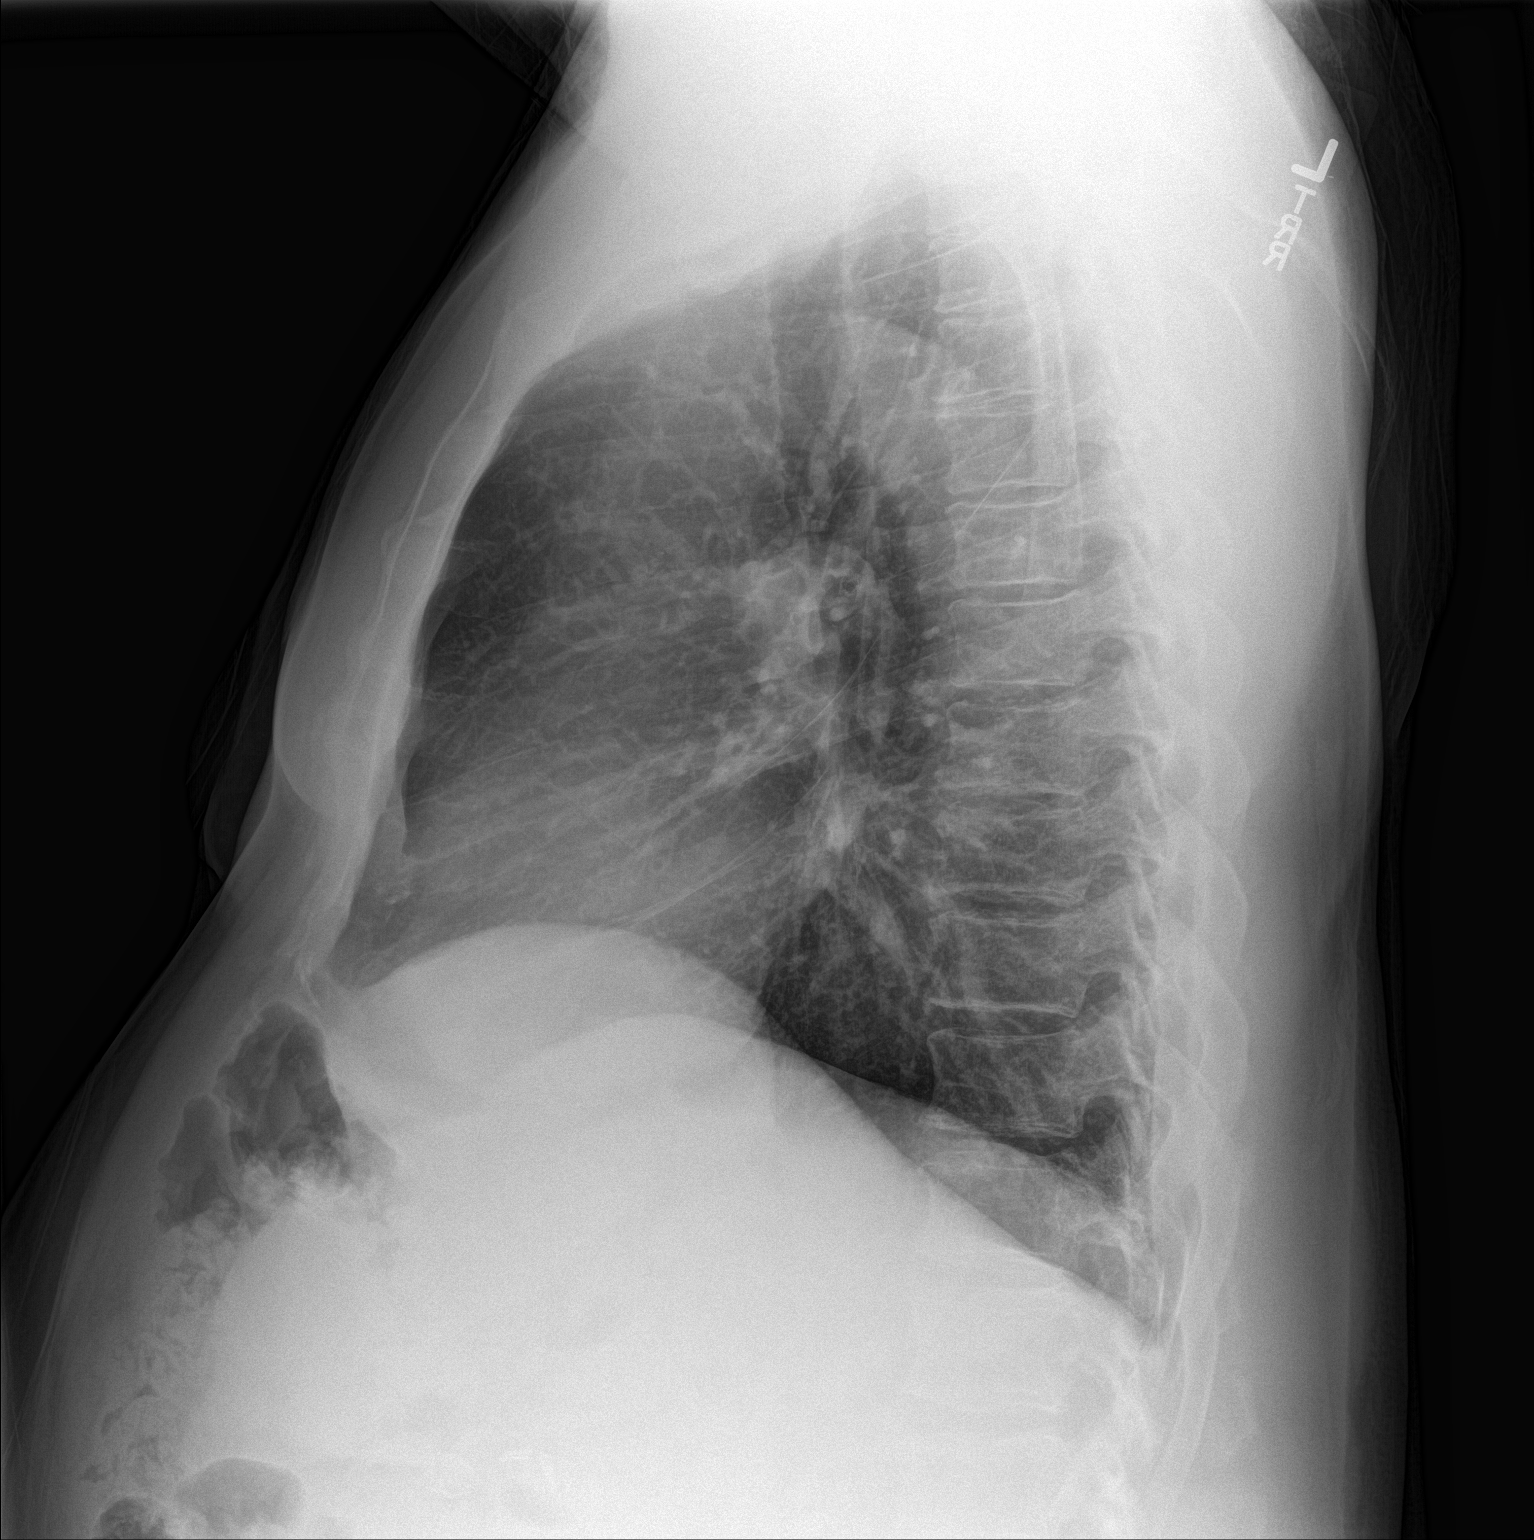

[2 of 2 positions shown; findings below may reference images not displayed]

FINDINGS: The heart size and mediastinal contours are within normal limits.
Both lungs are clear. No pleural effusion or pneumothorax. The
visualized skeletal structures are unremarkable.
IMPRESSION: No active cardiopulmonary disease.

## 2018-03-07 MED FILL — DESCOVY 200 MG-25 MG TABLET: 30 days supply | Qty: 30 | Fill #1 | Status: AC

## 2018-03-07 MED FILL — ATORVASTATIN 20 MG TABLET: 90 days supply | Qty: 90 | Fill #0

## 2018-03-07 MED FILL — TIVICAY 50 MG TABLET: ORAL | 30 days supply | Qty: 30 | Fill #1

## 2018-03-07 MED FILL — TIVICAY 50 MG TABLET: 30 days supply | Qty: 30 | Fill #1 | Status: AC

## 2018-03-07 MED FILL — DESCOVY 200 MG-25 MG TABLET: ORAL | 30 days supply | Qty: 30 | Fill #1

## 2018-03-07 MED FILL — ATORVASTATIN 20 MG TABLET: 90 days supply | Qty: 90 | Fill #0 | Status: AC

## 2018-03-08 ENCOUNTER — Ambulatory Visit: Admit: 2018-03-08 | Discharge: 2018-03-08 | Payer: MEDICARE

## 2018-03-08 ENCOUNTER — Ambulatory Visit: Admit: 2018-03-08 | Discharge: 2018-03-08 | Payer: MEDICARE | Attending: Nephrology | Primary: Nephrology

## 2018-03-08 DIAGNOSIS — I151 Hypertension secondary to other renal disorders: Principal | ICD-10-CM

## 2018-03-08 DIAGNOSIS — N2889 Other specified disorders of kidney and ureter: Secondary | ICD-10-CM

## 2018-03-08 DIAGNOSIS — I214 Non-ST elevation (NSTEMI) myocardial infarction: Secondary | ICD-10-CM

## 2018-03-08 DIAGNOSIS — K7469 Other cirrhosis of liver: Secondary | ICD-10-CM

## 2018-03-08 DIAGNOSIS — M1000 Idiopathic gout, unspecified site: Secondary | ICD-10-CM

## 2018-03-08 DIAGNOSIS — B20 Human immunodeficiency virus [HIV] disease: Secondary | ICD-10-CM

## 2018-03-08 DIAGNOSIS — N183 Chronic kidney disease, stage 3 (moderate): Secondary | ICD-10-CM

## 2018-03-08 DIAGNOSIS — Z23 Encounter for immunization: Secondary | ICD-10-CM

## 2018-03-08 DIAGNOSIS — B182 Chronic viral hepatitis C: Secondary | ICD-10-CM

## 2018-03-08 DIAGNOSIS — I5033 Acute on chronic diastolic (congestive) heart failure: Secondary | ICD-10-CM

## 2018-03-08 LAB — CBC W/ AUTO DIFF
BASOPHILS ABSOLUTE COUNT: 0.1 10*9/L (ref 0.0–0.1)
BASOPHILS RELATIVE PERCENT: 1.1 %
EOSINOPHILS ABSOLUTE COUNT: 0.1 10*9/L (ref 0.0–0.4)
EOSINOPHILS RELATIVE PERCENT: 1.3 %
HEMATOCRIT: 44.5 % (ref 41.0–53.0)
LYMPHOCYTES ABSOLUTE COUNT: 2.1 10*9/L (ref 1.5–5.0)
LYMPHOCYTES RELATIVE PERCENT: 25.5 %
MEAN CORPUSCULAR HEMOGLOBIN CONC: 32 g/dL (ref 31.0–37.0)
MEAN CORPUSCULAR HEMOGLOBIN: 29.5 pg (ref 26.0–34.0)
MEAN CORPUSCULAR VOLUME: 92 fL (ref 80.0–100.0)
MEAN PLATELET VOLUME: 8.6 fL (ref 7.0–10.0)
MONOCYTES ABSOLUTE COUNT: 0.6 10*9/L (ref 0.2–0.8)
MONOCYTES RELATIVE PERCENT: 7 %
NEUTROPHILS ABSOLUTE COUNT: 5.1 10*9/L (ref 2.0–7.5)
NEUTROPHILS RELATIVE PERCENT: 63 %
PLATELET COUNT: 160 10*9/L (ref 150–440)
RED BLOOD CELL COUNT: 4.83 10*12/L (ref 4.50–5.90)
RED CELL DISTRIBUTION WIDTH: 13.9 % (ref 12.0–15.0)
WBC ADJUSTED: 8.1 10*9/L (ref 4.5–11.0)

## 2018-03-08 LAB — RENAL FUNCTION PANEL
ANION GAP: 8 mmol/L (ref 7–15)
BUN / CREAT RATIO: 9
CALCIUM: 9.8 mg/dL (ref 8.5–10.2)
CHLORIDE: 103 mmol/L (ref 98–107)
CO2: 29 mmol/L (ref 22.0–30.0)
CREATININE: 1.75 mg/dL — ABNORMAL HIGH (ref 0.70–1.30)
EGFR CKD-EPI AA MALE: 46 mL/min/{1.73_m2} — ABNORMAL LOW (ref >=60)
EGFR CKD-EPI NON-AA MALE: 40 mL/min/{1.73_m2} — ABNORMAL LOW (ref >=60)
GLUCOSE RANDOM: 116 mg/dL (ref 65–179)
PHOSPHORUS: 2.8 mg/dL — ABNORMAL LOW (ref 2.9–4.7)
POTASSIUM: 4.1 mmol/L (ref 3.5–5.0)
SODIUM: 140 mmol/L (ref 135–145)

## 2018-03-08 LAB — ALBUMIN / CREATININE URINE RATIO
ALBUMIN QUANT URINE: 9.9 mg/dL
CREATININE, URINE: 166 mg/dL

## 2018-03-08 LAB — PARATHYROID HORMONE INTACT: Parathyrin.intact:MCnc:Pt:Ser/Plas:Qn:: 85.3 — ABNORMAL HIGH

## 2018-03-08 LAB — ALBUMIN/CREATININE RATIO: Albumin/Creatinine:MRto:Pt:Urine:Qn:: 59.6 — ABNORMAL HIGH

## 2018-03-08 LAB — CREATININE, URINE: Lab: 165.7

## 2018-03-08 LAB — ALBUMIN: Albumin:MCnc:Pt:Ser/Plas:Qn:: 4.3

## 2018-03-08 LAB — MEAN PLATELET VOLUME: Lab: 8.6

## 2018-03-08 NOTE — Unmapped (Signed)
Please see dictated note

## 2018-03-08 NOTE — Unmapped (Signed)
HISTORY OF PRESENT ILLNESS:  Samuel [name] is a 67 year old gentleman with stage G3b-4 A2 chronic kidney disease who returns today for a routine followup.  His leg edema has been relatively stable.  His weight had increased after I last saw him but is now back down to his baseline.  He has an appointment to speak with the nutritionist in the near future.  His blood pressure has been well controlled.  He does not have fevers, chills, night sweats, nausea, vomiting, abdominal pain.  He was recently found to have 2 nonobstructing stones on a recent abdominal ultrasound performed on 7/25.  This showed that the kidneys were normal in size and echotexture.  He had bilateral simple and then minimally complex cysts measuring 6.6 cm on the right and 3.2 cm on the left.  There were shadowing calculi in the upper poles bilaterally measuring 1.6 cm on the left and 0.6 cm on the right.  He has a history of nephrolithiasis, requiring lithotripsy in the past.    MEDICATIONS:     Medication Sig   ??? allopurinol (ZYLOPRIM) 100 MG tablet Take 2 tablets (200 mg total) by mouth daily.   ??? aspirin (ECOTRIN) 81 MG tablet    ??? atorvastatin (LIPITOR) 20 MG tablet TAKE 1 TABLET (20 MG) BY MOUTH DAILY   ??? baclofen (LIORESAL) 10 MG tablet Take 1 tablet (10 mg total) by mouth Three (3) times a day as needed for muscle spasms.   ??? calcium carbonate-vitamin D2 500 mg(1,250mg ) -200 unit tablet Take 1 tablet by mouth Two (2) times a day.   ??? carvedilol (COREG) 12.5 MG tablet TAKE 1 TABLET BY MOUTH TWICE DAILY   ??? cloNIDine HCl (CATAPRES) 0.1 MG tablet Take 0.1 mg by mouth 3 (three) times a day.   ??? cranberry 400 mg cap Take 400 mg by mouth daily.   ??? cyanocobalamin 1000 MCG tablet Take 1,000 mcg by mouth.   ??? diazepam (VALIUM) 5 MG tablet Take 5 mg by mouth nightly.    ??? ferrous sulfate 325 (65 FE) MG tablet Take 325 mg by mouth daily with breakfast.    ??? folic acid (FOLVITE) 1 MG tablet    ??? glimepiride (AMARYL) 1 MG tablet take 2 tablet by mouth once daily with BREAKFAST   ??? losartan (COZAAR) 100 MG tablet Take 100 mg by mouth.   ??? nystatin (MYCOSTATIN) 100,000 unit/mL suspension Take 5 mL by mouth.   ??? omega-3 fatty acids-fish oil 340-1,000 mg capsule Take 1 capsule by mouth.   ??? omeprazole (PRILOSEC) 20 MG capsule Take 1 capsule (20 mg total) by mouth daily.   ??? predniSONE (DELTASONE) 10 MG tablet Take one tablet daily by mouth for the next five days.   ??? spironolactone (ALDACTONE) 25 MG tablet Take 1 tablet (25 mg total) by mouth daily.   ??? tiZANidine (ZANAFLEX) 4 MG tablet Take 2 mg by mouth.   ??? torsemide (DEMADEX) 20 MG tablet Take 2 tablets (40 mg total) by mouth daily.   ??? albuterol (PROVENTIL HFA;VENTOLIN HFA) 90 mcg/actuation inhaler Inhale 2 puffs.   ??? amLODIPine (NORVASC) 2.5 MG tablet Take 1 tablet (2.5 mg total) by mouth every morning before breakfast. (Patient not taking: Reported on 03/08/2018)   ??? cefuroxime (CEFTIN) 250 MG tablet    ??? docusate sodium (COLACE) 100 MG capsule Take 1 capsule (100 mg total) by mouth every twelve (12) hours.   ??? dolutegravir (TIVICAY) 50 mg Tab TABLET TAKE 1 TABLET BY MOUTH DAILY   ???  emtricitabine-tenofovir alafen (DESCOVY) 200-25 mg tablet TAKE 1 TABLET BY MOUTH DAILY   ??? HYDROcodone-acetaminophen (NORCO) 5-325 mg per tablet Directions are take one tablet 30 minutes before physical therapy, then take one tablet at bedtime as needed for pain   ??? lidocaine (XYLOCAINE) 5 % ointment Apply 1 application topically daily as needed.   ??? nitroglycerin (NITROSTAT) 0.4 MG SL tablet Place 1 tablet (0.4 mg total) under the tongue every five (5) minutes as needed for chest pain.   ??? ondansetron (ZOFRAN) 4 MG tablet take 1 tablet by mouth every 8 hours if needed for nausea   ??? oxyCODONE-acetaminophen (PERCOCET) 5-325 mg per tablet Take 1 tablet by mouth.   ??? sertraline (ZOLOFT) 50 MG tablet take 1 tablet by mouth once daily       REVIEW OF SYSTEMS:  General:  No fevers/chills.  Pulmonary:  No cough.  Cardiovascular:  No chest pain.  All other systems are reviewed and are negative except that noted in the history of present illness.    PHYSICAL EXAMINATION:    GENERAL:  He is in no acute distress.  VITAL SIGNS:  BP 132/80  - Pulse 68  - Temp 36.7 ??C (98 ??F) (Temporal)  - Ht 180.4 cm (5' 11.02)  - Wt (!) 102.5 kg (226 lb)  - BMI 31.50 kg/m??   Wt Readings from Last 3 Encounters:   03/08/18 (!) 102.5 kg (226 lb)   12/01/17 (!) 104.4 kg (230 lb 1.6 oz)   11/29/17 (!) 104.3 kg (229 lb 14.4 oz)   NECK:  Supple without lymphadenopathy.  HEENT:  Oropharynx, no lesions.  LUNGS:  Clear.  HEART:  Regular rate and rhythm, S4, S1, S2.  A grade 2 systolic early peaking crescendo-decrescendo murmur is heard best at the aortic area.  It is nonradiating.  ABDOMEN:  Soft, nontender.  Bowel sounds are normal.  EXTREMITIES:  1+ ankle edema bilaterally.  MUSCULOSKELETAL:  No synovitis.  NEUROLOGICAL:  No lateralizing deficits.    LABORATORY STUDIES:      Protein/Creatinine Ratio, Urine    Collection Time: 03/08/18  9:59 AM   Result Value Ref Range    Creat U 165.7 Undefined mg/dL    Protein, Ur 13.0 Undefined mg/dL    Protein/Creatinine Ratio, Urine 0.244 Undefined   PTH    Collection Time: 03/08/18 11:31 AM   Result Value Ref Range    PTH 85.3 (H) 12.0 - 72.0 pg/mL    Calcium 9.8 8.5 - 10.2 mg/dL   Renal Function Panel    Collection Time: 03/08/18 11:31 AM   Result Value Ref Range    Sodium 140 135 - 145 mmol/L    Potassium 4.1 3.5 - 5.0 mmol/L    Chloride 103 98 - 107 mmol/L    CO2 29.0 22.0 - 30.0 mmol/L    Anion Gap 8 7 - 15 mmol/L    BUN 15 7 - 21 mg/dL    Creatinine 8.65 (H) 0.70 - 1.30 mg/dL    BUN/Creatinine Ratio 9     EGFR CKD-EPI Non-African American, Male 40 (L) >=60 mL/min/1.77m2    EGFR CKD-EPI African American, Male 46 (L) >=60 mL/min/1.77m2    Glucose 116 65 - 179 mg/dL    Calcium 9.8 8.5 - 78.4 mg/dL    Phosphorus 2.8 (L) 2.9 - 4.7 mg/dL    Albumin 4.3 3.5 - 5.0 g/dL   CBC w/ Differential    Collection Time: 03/08/18 11:31 AM   Result Value Ref Range  WBC 8.1 4.5 - 11.0 10*9/L    RBC 4.83 4.50 - 5.90 10*12/L    HGB 14.2 13.5 - 17.5 g/dL    HCT 16.1 09.6 - 04.5 %    MCV 92.0 80.0 - 100.0 fL    MCH 29.5 26.0 - 34.0 pg    MCHC 32.0 31.0 - 37.0 g/dL    RDW 40.9 81.1 - 91.4 %    MPV 8.6 7.0 - 10.0 fL    Platelet 160 150 - 440 10*9/L    Neutrophils % 63.0 %    Lymphocytes % 25.5 %    Monocytes % 7.0 %    Eosinophils % 1.3 %    Basophils % 1.1 %    Absolute Neutrophils 5.1 2.0 - 7.5 10*9/L    Absolute Lymphocytes 2.1 1.5 - 5.0 10*9/L    Absolute Monocytes 0.6 0.2 - 0.8 10*9/L    Absolute Eosinophils 0.1 0.0 - 0.4 10*9/L    Absolute Basophils 0.1 0.0 - 0.1 10*9/L    Large Unstained Cells 2 0 - 4 %   Albumin/creatinine urine ratio    Collection Time: 03/08/18  4:15 PM   Result Value Ref Range    Creat U 166.0 Undefined mg/dL    Albumin Quantitative, Urine 9.9 mg/dL    Albumin/Creatinine Ratio 59.6 (H) 0.0 - 30.0 ug/mg     PTH trend:    Date Value   03/08/2018 85.3 (H)   11/02/2017 55.3     Serum creatinine trend:    Date Value   03/08/2018 1.75 (H)   12/01/2017 1.73 (H)   11/02/2017 1.49 (H)   08/17/2017 1.97 (H)   04/02/2017 2.03 (H)   02/23/2017 2.30 (H)       IMPRESSION AND PLAN:    1.  Stage G3a-b A2 chronic kidney disease, most likely related to arterionephrosclerosis and focal global glomerulosclerosis.  His renal function is stable.  His blood pressure is adequately controlled.  He will continue on his current regimen.    2.  Hypertension.  Continue carvedilol, clonidine, diuretic therapy, and amlodipine.    3.  Human immunodeficiency virus infection, asymptomatic, with undetectable viral load on HAART therapy.    4.  Urinary tract symptoms.  Continue tamsulosin.    5.  Hypercalcemia.  His 25D level was normal.  Will therefore check 1,25-dihydroxyvitamin D and PTH-related protein today.    6.  Followup.  I will plan to see him back in 3 months.    7.  History of kidney stones.  Will obtain 24-hour metabolic evaluation of the urine.      Zetta Bills. Stefano Gaul, MD  Date of service 03/08/2018

## 2018-03-08 NOTE — Unmapped (Addendum)
1. [facility]ol[facility] analysis of 24 hour urine collection-call number on requisition and save it because you will need to return it with your kit.    2. Labs today    3. I will call you with your lab results

## 2018-03-11 LAB — VITAMIN D 1,25-DIHYDROXY: 1,25-Dihydroxyvitamin D:MCnc:Pt:Ser/Plas:Qn:: 47

## 2018-03-30 NOTE — Unmapped (Signed)
[facility] Refill Coordination Note  Specialty Medication(s): Descovy, Tivicay  Additional Medications shipped: n/a    [name], DOB: [birth year]  Phone: [phone] (home) , Alternate phone contact: N/A  Phone or address changes today?: No  All above HIPAA information was verified with patient.  Shipping Address: [address]  [place] [address]   Insurance changes? No    Completed refill call assessment today to schedule patient's medication shipment from the [facility] [phone]).      Confirmed the medication and dosage are correct and have not changed: Yes, regimen is correct and unchanged.    Confirmed patient started or stopped the following medications in the past month:  No, there are no changes reported at this time.    Are you tolerating your medication?:  Aadyn reports tolerating the medication.    ADHERENCE    (Below is required for Medicare Part B or Transplant patients only - per drug): Tivicay  How many tablets were dispensed last month: 30  Patient currently has 7 tablets remaining.    (Below is required for Medicare Part B or Transplant patients only - per drug): Descovy  How many tablets were dispensed last month: 30  Patient currently has 7 tablets remaining.    Did you miss any doses in the past 4 weeks? No missed doses reported.    FINANCIAL/SHIPPING    Delivery Scheduled: Yes, Expected medication delivery date: 04/01/2018     Medication will be delivered via Next Day Courier to the prescription address in [facility].    The patient will receive a drug information handout for each medication shipped and additional FDA Medication Guides as required.      Maximos did not have any additional questions at this time.    We will follow up with patient monthly for standard refill processing and delivery.      Thank you,  Simonne Martinet   [facility] Specialty PharmD Candidate

## 2018-03-31 MED FILL — DESCOVY 200 MG-25 MG TABLET: 30 days supply | Qty: 30 | Fill #2 | Status: AC

## 2018-03-31 MED FILL — TIVICAY 50 MG TABLET: 30 days supply | Qty: 30 | Fill #2 | Status: AC

## 2018-03-31 MED FILL — TIVICAY 50 MG TABLET: ORAL | 30 days supply | Qty: 30 | Fill #2

## 2018-03-31 MED FILL — DESCOVY 200 MG-25 MG TABLET: ORAL | 30 days supply | Qty: 30 | Fill #2

## 2018-04-11 ENCOUNTER — Inpatient Hospital Stay: Payer: Medicare Other

## 2018-04-11 ENCOUNTER — Inpatient Hospital Stay: Payer: Medicare Other | Admitting: Internal Medicine

## 2018-04-11 NOTE — Assessment & Plan Note (Deleted)
#   Severe iron deficiency anemia/hemoglobin- 6/ ferritin 5 [march 2017]. Status post IV iron- significant improvement of hemoglobin.  Last IV Feraheme was 08/28/2015 where he received 510 mg.   #Was seen by Dr. Rogue Bussing in February 2019 with complaints of worsening significant pica/fatigue/cold intolerance.  Started iron PO BID.   # CKD stage III-M protein non-detectable.  Kappa lambda ratio slightly elevated at 1.74.   # Acute Abdominal Pain: Encouraged patient to take iron pills every other day see if this helps with abdominal pain/bloating.  Patient to call clinic if this does not help within the next several weeks.   #Recommend follow-up in 6 months labs and possible IV iron.

## 2018-04-11 NOTE — Progress Notes (Deleted)
[place] NOTE  Patient Care Team: Tracie Harrier, MD as PCP - General (Internal Medicine)  CHIEF COMPLAINTS/PURPOSE OF CONSULTATION:   # SEVERE IRON DEFICIENCY ANEMIA- [EGD Aug 2016-esophageal ulcer; colonoscopy-? 2001]; EGD- April 2017- Neg. UA- neg.   # HIV- Dr.Idarmore; [facility]/Hepatitis C [no treatment]  # CKD creat- 1.6- 2.0   HISTORY OF PRESENTING ILLNESS:  Jared Walker 67 y.o.  male history of chronic kidney disease/ and the severe iron deficiency is here for follow-up.  Patient in the interim fell hurt his left shoulder; recent surgery.  Patient denies any blood in stools black stools.  He complains of worsening fatigue also complains of pica; and also cold intolerance.  No shortness of breath. no vomiting no weight loss. No blood in urine.    ROS: A complete 10 point review of system is done which is negative except mentioned above in history of present illness  MEDICAL HISTORY:  Past Medical History:  Diagnosis Date  . Anemia   . Aortic stenosis, mild    mild AS by echo, 08/2011  . Cervical disc herniation   . CHF (congestive heart failure) ([place])   . Chronic kidney disease    kidney stones. chronic kidney disease, stage III. followed by doctor in [place]  . Cirrhosis ([place])    due to hepatitis c not being treated for a long periiod of time  . Coronary artery disease   . Depression   . Diabetes mellitus without complication ([place]) 25/9563   A1C 10.0  . Full dentures    upper and lower  . GERD (gastroesophageal reflux disease)   . Gout   . Heart murmur    no treatment  . Hep C w/o coma, chronic ([place]) 01/19/2015   took treatment and it is now gone.   . Hepatitis C 09/25/11  . HIV positive ([place]) 09/25/2011   nondetectable.   . Hypertension   . MI (myocardial infarction) ([place]) 2017   NSTEMI  . Neuromuscular disorder ([facility])    numbness in fingers most likely due to shoulder damage.    SURGICAL HISTORY: Past Surgical History:   Procedure Laterality Date  . BACK SURGERY  1985   lumbar laminectomy. metal in pelvis  . bulging disc Bilateral 1990   L4-L5  . CARDIOVASCULAR STRESS TEST     at [facility]  . CHOLECYSTECTOMY    . CLOSED REDUCTION PELVIC FRACTURE    . ESOPHAGOGASTRODUODENOSCOPY (EGD) WITH PROPOFOL N/A 01/21/2015   Procedure: ESOPHAGOGASTRODUODENOSCOPY (EGD) WITH PROPOFOL;  Surgeon: Manya Silvas, MD;  Location: [facility] ENDOSCOPY;  Service: Endoscopy;  Laterality: N/A;  . ESOPHAGOGASTRODUODENOSCOPY (EGD) WITH PROPOFOL N/A 09/04/2015   Procedure: ESOPHAGOGASTRODUODENOSCOPY (EGD) WITH PROPOFOL;  Surgeon: Manya Silvas, MD;  Location: [facility] ENDOSCOPY;  Service: Endoscopy;  Laterality: N/A;  . FRACTURE SURGERY Right    hand.  pins in hand from long ago  . KNEE ARTHROSCOPY  2009   Right  . LUMBAR LAMINECTOMY/DECOMPRESSION MICRODISCECTOMY  09/25/2011   Procedure: LUMBAR LAMINECTOMY/DECOMPRESSION MICRODISCECTOMY;  Surgeon: Erline Levine, MD;  Location: [place] NEURO ORS;  Service: Neurosurgery;  Laterality: Left;  Left Lumbar Four-Five Microdiskectomy  . NEPHROSTOMY     tube Left  . SHOULDER ARTHROSCOPY WITH OPEN ROTATOR CUFF REPAIR Right 10/12/2014   Procedure: SHOULDER ARTHROSCOP with decompression;  Surgeon: Leanor Kail, MD;  Location: [place];  Service: Orthopedics;  Laterality: Right;  . SHOULDER ARTHROSCOPY WITH OPEN ROTATOR CUFF REPAIR Left 07/08/2017   Procedure: SHOULDER ARTHROSCOPY WITH OPEN ROTATOR  CUFF REPAIR;  Surgeon: Corky Mull, MD;  Location: [facility] ORS;  Service: Orthopedics;  Laterality: Left;    SOCIAL HISTORY: Social History   Socioeconomic History  . Marital status: Married    Spouse name: Not on file  . Number of children: Not on file  . Years of education: Not on file  . Highest education level: Not on file  Occupational History  . Not on file  Social Needs  . Financial resource strain: Not on file  . Food insecurity:    Worry: Not on file    Inability: Not on file  .  Transportation needs:    Medical: Not on file    Non-medical: Not on file  Tobacco Use  . Smoking status: Former Smoker    Packs/day: 0.50    Types: Cigarettes    Last attempt to quit: 06/1978    Years since quitting: 39.8  . Smokeless tobacco: Former Systems developer    Quit date: 03/13/1972  Substance and Sexual Activity  . Alcohol use: No  . Drug use: No  . Sexual activity: Not on file  Lifestyle  . Physical activity:    Days per week: Not on file    Minutes per session: Not on file  . Stress: Not on file  Relationships  . Social connections:    Talks on phone: Not on file    Gets together: Not on file    Attends religious service: Not on file    Active member of club or organization: Not on file    Attends meetings of clubs or organizations: Not on file    Relationship status: Not on file  . Intimate partner violence:    Fear of current or ex partner: Not on file    Emotionally abused: Not on file    Physically abused: Not on file    Forced sexual activity: Not on file  Other Topics Concern  . Not on file  Social History Narrative   Independent at baseline. Ambulates with a cane. Lives at home with his wife    FAMILY HISTORY: Family History  Problem Relation Age of Onset  . Hypertension Mother   . Cancer Father   . Heart disease Sister   . Diabetes Brother   . Anesthesia problems Neg Hx     ALLERGIES:  is allergic to oxycodone-acetaminophen; buchu-cornsilk-ch grass-hydran; colchicine; nifedipine; and tramadol.  MEDICATIONS:  Current Outpatient Medications  Medication Sig Dispense Refill  . albuterol (PROVENTIL HFA;VENTOLIN HFA) 108 (90 Base) MCG/ACT inhaler Inhale 1-2 puffs into the lungs every 6 (six) hours as needed for wheezing or shortness of breath.     . allopurinol (ZYLOPRIM) 100 MG tablet Take 300 mg by mouth daily.     Marland Kitchen amLODipine (NORVASC) 5 MG tablet Take 5 mg by mouth daily.     Marland Kitchen aspirin EC 81 MG EC tablet Take 1 tablet (81 mg total) by mouth daily. 120  tablet 0  . atorvastatin (LIPITOR) 80 MG tablet Take 80 mg by mouth daily. Take in the morning.    . carvedilol (COREG) 12.5 MG tablet Take 12.5 mg by mouth 2 (two) times daily with a meal.    . Cholecalciferol (VITAMIN D-1000 MAX ST) 1000 units tablet Take 1,000 Units by mouth daily.    . cloNIDine (CATAPRES) 0.1 MG tablet Take 0.1 mg by mouth 3 (three) times daily.    . Cranberry 400 MG CAPS Take 400 mg by mouth daily.    . DESCOVY 200-25  MG tablet Take 1 tablet by mouth at bedtime.   3  . diazepam (VALIUM) 5 MG tablet Take 5 mg by mouth at bedtime.     . dolutegravir (TIVICAY) 50 MG tablet Take 50 mg by mouth at bedtime.     . ferrous sulfate 325 (65 FE) MG tablet Take 325 mg by mouth daily with breakfast.     . folic acid (FOLVITE) 1 MG tablet Take 1 mg by mouth daily. In the morning    . glimepiride (AMARYL) 2 MG tablet Take 2 mg by mouth daily with breakfast.    . HYDROcodone-acetaminophen (NORCO/VICODIN) 5-325 MG tablet Directions are take one tablet 30 minutes before physical therapy, then take one tablet at bedtime as needed for pain    . isosorbide mononitrate (IMDUR) 30 MG 24 hr tablet Take 1 tablet (30 mg total) by mouth daily. 30 tablet 5  . lidocaine (XYLOCAINE) 5 % ointment Apply 1 application topically daily as needed for mild pain.     Marland Kitchen losartan (COZAAR) 100 MG tablet Take 100 mg by mouth every morning.   0  . nystatin (MYCOSTATIN) 100000 UNIT/ML suspension Take 5 mLs by mouth 4 (four) times daily as needed for mouth pain.    . Omega-3 Fatty Acids (FISH OIL PO) Take by mouth.    . ondansetron (ZOFRAN) 4 MG tablet TAKE 1 TABLET BY MOUTH EVERY 8 HOURS IF NEEDED FOR NAUSEA  0  . pantoprazole (PROTONIX) 40 MG tablet Take 1 tablet by mouth daily.    . sertraline (ZOLOFT) 50 MG tablet Take 50 mg by mouth every evening.     Marland Kitchen tiZANidine (ZANAFLEX) 4 MG tablet Take 0.5 tablets (2 mg total) by mouth 2 (two) times daily. 30 tablet 0  . torsemide (DEMADEX) 20 MG tablet Take 1 tablet (20  mg total) by mouth daily. 30 tablet 5   No current facility-administered medications for this visit.       Marland Kitchen  PHYSICAL EXAMINATION:   There were no vitals filed for this visit. There were no vitals filed for this visit.  GENERAL: Well-nourished well-developed; Alert, no distress and comfortable.  Accompanied by his wife. EYES: positive for pallor; no icterus OROPHARYNX: no thrush or ulceration; good dentition  NECK: supple, no masses felt LYMPH:  no palpable lymphadenopathy in the cervical, axillary or inguinal regions LUNGS: clear to auscultation and  No wheeze or crackles HEART/CVS: regular rate & rhythm and no murmurs; No lower extremity edema ABDOMEN: abdomen soft, non-tender and normal bowel sounds Musculoskeletal:no cyanosis of digits and no clubbing  PSYCH: alert & oriented x 3 with fluent speech NEURO: no focal motor/sensory deficits SKIN:  no rashes or significant lesions  LABORATORY DATA:  I have reviewed the data as listed Lab Results  Component Value Date   WBC 10.2 10/08/2017   HGB 14.2 10/08/2017   HCT 41.9 10/08/2017   MCV 91.1 10/08/2017   PLT 139 (L) 10/08/2017   Recent Labs    07/05/17 1150 07/17/17 1657  NA 140 142  K 4.4 3.6  CL 107 104  CO2 25 30  GLUCOSE 213* 187*  BUN 20 18  CREATININE 2.02* 1.68*  CALCIUM 9.8 9.5  GFRNONAA 33* 41*  GFRAA 38* 48*  PROT  --  7.7  ALBUMIN  --  3.7  AST  --  31  ALT  --  35  ALKPHOS  --  73  BILITOT  --  1.1     ASSESSMENT & PLAN:  No problem-specific Assessment & Plan notes found for this encounter.      Cammie Sickle, MD 04/11/2018 9:54 AM

## 2018-04-19 MED ORDER — CARVEDILOL 12.5 MG TABLET
ORAL_TABLET | Freq: Two times a day (BID) | ORAL | 0 refills | 0.00000 days | Status: CP
Start: 2018-04-19 — End: 2018-08-08

## 2018-04-22 NOTE — Unmapped (Signed)
[facility] Refill Coordination Note    Specialty Medication(s) to be Shipped:   Infectious Disease: Descovy and Tivicay    Other medication(s) to be shipped: [employer], DOB: 1950/10/16  Phone: [phone] (home)       All above HIPAA information was verified with patient.     Completed refill call assessment today to schedule patient's medication shipment from the [facility] [phone]).       Specialty medication(s) and dose(s) confirmed: Regimen is correct and unchanged.   Changes to medications: Vong reports no changes reported at this time.  Changes to insurance: No  Questions for the pharmacist: No    The patient will receive a drug information handout for each medication shipped and additional FDA Medication Guides as required.      DISEASE/MEDICATION-SPECIFIC INFORMATION            ADHERENCE              MEDICARE PART B DOCUMENTATION         SHIPPING     Shipping address confirmed in Epic.     Delivery Scheduled: Yes, Expected medication delivery date: 04/25/18 via UPS or courier.     Medication will be delivered via Same Day Courier to the prescription address in Epic W[facility].    Arnold Long   [facility] Specialty Pharmacist

## 2018-04-25 MED FILL — DESCOVY 200 MG-25 MG TABLET: ORAL | 30 days supply | Qty: 30 | Fill #3

## 2018-04-25 MED FILL — TIVICAY 50 MG TABLET: 30 days supply | Qty: 30 | Fill #3 | Status: AC

## 2018-04-25 MED FILL — TIVICAY 50 MG TABLET: ORAL | 30 days supply | Qty: 30 | Fill #3

## 2018-04-25 MED FILL — DESCOVY 200 MG-25 MG TABLET: 30 days supply | Qty: 30 | Fill #3 | Status: AC

## 2018-05-04 ENCOUNTER — Inpatient Hospital Stay: Payer: Medicare Other | Attending: Internal Medicine

## 2018-05-04 ENCOUNTER — Inpatient Hospital Stay (HOSPITAL_BASED_OUTPATIENT_CLINIC_OR_DEPARTMENT_OTHER): Payer: Medicare Other | Admitting: Internal Medicine

## 2018-05-04 VITALS — BP 143/83 | HR 63 | Resp 16 | Wt 225.0 lb

## 2018-05-04 DIAGNOSIS — Z79899 Other long term (current) drug therapy: Secondary | ICD-10-CM | POA: Insufficient documentation

## 2018-05-04 DIAGNOSIS — Z7982 Long term (current) use of aspirin: Secondary | ICD-10-CM | POA: Diagnosis not present

## 2018-05-04 DIAGNOSIS — B182 Chronic viral hepatitis C: Secondary | ICD-10-CM | POA: Diagnosis not present

## 2018-05-04 DIAGNOSIS — I10 Essential (primary) hypertension: Secondary | ICD-10-CM | POA: Diagnosis not present

## 2018-05-04 DIAGNOSIS — N183 Chronic kidney disease, stage 3 unspecified: Secondary | ICD-10-CM

## 2018-05-04 DIAGNOSIS — D509 Iron deficiency anemia, unspecified: Secondary | ICD-10-CM | POA: Insufficient documentation

## 2018-05-04 DIAGNOSIS — Z7984 Long term (current) use of oral hypoglycemic drugs: Secondary | ICD-10-CM

## 2018-05-04 DIAGNOSIS — Z87891 Personal history of nicotine dependence: Secondary | ICD-10-CM | POA: Insufficient documentation

## 2018-05-04 DIAGNOSIS — D500 Iron deficiency anemia secondary to blood loss (chronic): Secondary | ICD-10-CM

## 2018-05-04 DIAGNOSIS — B20 Human immunodeficiency virus [HIV] disease: Secondary | ICD-10-CM

## 2018-05-04 DIAGNOSIS — E119 Type 2 diabetes mellitus without complications: Secondary | ICD-10-CM

## 2018-05-04 DIAGNOSIS — D631 Anemia in chronic kidney disease: Secondary | ICD-10-CM

## 2018-05-04 LAB — CBC WITH DIFFERENTIAL/PLATELET
ABS IMMATURE GRANULOCYTES: 0.04 10*3/uL (ref 0.00–0.07)
Basophils Absolute: 0 10*3/uL (ref 0.0–0.1)
Basophils Relative: 0 %
Eosinophils Absolute: 0.1 10*3/uL (ref 0.0–0.5)
Eosinophils Relative: 2 %
HCT: 40.7 % (ref 39.0–52.0)
HEMOGLOBIN: 13.5 g/dL (ref 13.0–17.0)
IMMATURE GRANULOCYTES: 1 %
Lymphocytes Relative: 26 %
Lymphs Abs: 2.1 10*3/uL (ref 0.7–4.0)
MCH: 29.3 pg (ref 26.0–34.0)
MCHC: 33.2 g/dL (ref 30.0–36.0)
MCV: 88.5 fL (ref 80.0–100.0)
MONO ABS: 0.7 10*3/uL (ref 0.1–1.0)
Monocytes Relative: 8 %
NEUTROS ABS: 5.2 10*3/uL (ref 1.7–7.7)
NEUTROS PCT: 63 %
NRBC: 0 % (ref 0.0–0.2)
PLATELETS: 146 10*3/uL — AB (ref 150–400)
RBC: 4.6 MIL/uL (ref 4.22–5.81)
RDW: 13.3 % (ref 11.5–15.5)
WBC: 8.2 10*3/uL (ref 4.0–10.5)

## 2018-05-04 LAB — IRON AND TIBC
Iron: 86 ug/dL (ref 45–182)
SATURATION RATIOS: 22 % (ref 17.9–39.5)
TIBC: 385 ug/dL (ref 250–450)
UIBC: 299 ug/dL

## 2018-05-04 LAB — FERRITIN: Ferritin: 28 ng/mL (ref 24–336)

## 2018-05-04 LAB — BASIC METABOLIC PANEL
ANION GAP: 10 (ref 5–15)
BUN: 16 mg/dL (ref 8–23)
CO2: 27 mmol/L (ref 22–32)
Calcium: 9.7 mg/dL (ref 8.9–10.3)
Chloride: 106 mmol/L (ref 98–111)
Creatinine, Ser: 1.75 mg/dL — ABNORMAL HIGH (ref 0.61–1.24)
GFR calc Af Amer: 46 mL/min — ABNORMAL LOW (ref >60)
GFR calc non Af Amer: 40 mL/min — ABNORMAL LOW (ref >60)
GLUCOSE: 150 mg/dL — AB (ref 70–99)
POTASSIUM: 3.8 mmol/L (ref 3.5–5.1)
Sodium: 143 mmol/L (ref 135–145)

## 2018-05-04 NOTE — Progress Notes (Signed)
[facility] NOTE  Patient Care Team: Tracie Harrier, MD as PCP - General (Internal Medicine)  CHIEF COMPLAINTS/PURPOSE OF CONSULTATION:   # SEVERE IRON DEFICIENCY ANEMIA- [EGD Aug 2016-esophageal ulcer; colonoscopy-? 2001]; EGD- April 2017- Neg. UA- neg.   # HIV- Dr.Idarmore; [facility]/Hepatitis C [no treatment]  # CKD creat- 1.6- 2.0   HISTORY OF PRESENTING ILLNESS:  Jared Walker 67 y.o.  male history of chronic kidney disease/ and the severe iron deficiency is here for follow-up.  Patient denies any blood in stools or black-colored stools.  He continues to be on iron pills.  No nausea no vomiting.  No chest pain.  No cough or shortness of breath.  He admits to compliance with his HIV medications.  Denies any opportunistic infections.  Review of Systems  Constitutional: Negative for chills, diaphoresis, fever, malaise/fatigue and weight loss.  HENT: Negative for nosebleeds and sore throat.   Eyes: Negative for double vision.  Respiratory: Negative for cough, hemoptysis, sputum production, shortness of breath and wheezing.   Cardiovascular: Negative for chest pain, palpitations, orthopnea and leg swelling.  Gastrointestinal: Negative for abdominal pain, blood in stool, constipation, diarrhea, heartburn, melena, nausea and vomiting.  Genitourinary: Negative for dysuria, frequency and urgency.  Musculoskeletal: Negative for back pain and joint pain.  Skin: Negative.  Negative for itching and rash.  Neurological: Negative for dizziness, tingling, focal weakness, weakness and headaches.  Endo/Heme/Allergies: Does not bruise/bleed easily.  Psychiatric/Behavioral: Negative for depression. The patient is not nervous/anxious and does not have insomnia.      MEDICAL HISTORY:  Past Medical History:  Diagnosis Date  . Anemia   . Aortic stenosis, mild    mild AS by echo, 08/2011  . Cervical disc herniation   . CHF (congestive heart failure) ([place])   . Chronic  kidney disease    kidney stones. chronic kidney disease, stage III. followed by doctor in [place]  . Cirrhosis ([place])    due to hepatitis c not being treated for a long periiod of time  . Coronary artery disease   . Depression   . Diabetes mellitus without complication ([place]) 16/0109   A1C 10.0  . Full dentures    upper and lower  . GERD (gastroesophageal reflux disease)   . Gout   . Heart murmur    no treatment  . Hep C w/o coma, chronic ([place]) 01/19/2015   took treatment and it is now gone.   . Hepatitis C 09/25/11  . HIV positive ([place]) 09/25/2011   nondetectable.   . Hypertension   . MI (myocardial infarction) ([place]) 2017   NSTEMI  . Neuromuscular disorder (HCC)    numbness in fingers most likely due to shoulder damage.    SURGICAL HISTORY: Past Surgical History:  Procedure Laterality Date  . BACK SURGERY  1985   lumbar laminectomy. metal in pelvis  . bulging disc Bilateral 1990   L4-L5  . CARDIOVASCULAR STRESS TEST     at [facility]  . CHOLECYSTECTOMY    . CLOSED REDUCTION PELVIC FRACTURE    . ESOPHAGOGASTRODUODENOSCOPY (EGD) WITH PROPOFOL N/A 01/21/2015   Procedure: ESOPHAGOGASTRODUODENOSCOPY (EGD) WITH PROPOFOL;  Surgeon: Manya Silvas, MD;  Location: [facility] ENDOSCOPY;  Service: Endoscopy;  Laterality: N/A;  . ESOPHAGOGASTRODUODENOSCOPY (EGD) WITH PROPOFOL N/A 09/04/2015   Procedure: ESOPHAGOGASTRODUODENOSCOPY (EGD) WITH PROPOFOL;  Surgeon: Manya Silvas, MD;  Location: [facility] ENDOSCOPY;  Service: Endoscopy;  Laterality: N/A;  . FRACTURE SURGERY Right    hand.  pins in hand  from long ago  . KNEE ARTHROSCOPY  2009   Right  . LUMBAR LAMINECTOMY/DECOMPRESSION MICRODISCECTOMY  09/25/2011   Procedure: LUMBAR LAMINECTOMY/DECOMPRESSION MICRODISCECTOMY;  Surgeon: Erline Levine, MD;  Location: [facility] NEURO ORS;  Service: Neurosurgery;  Laterality: Left;  Left Lumbar Four-Five Microdiskectomy  . NEPHROSTOMY     tube Left  . SHOULDER ARTHROSCOPY WITH OPEN ROTATOR CUFF REPAIR Right  10/12/2014   Procedure: SHOULDER ARTHROSCOP with decompression;  Surgeon: Leanor Kail, MD;  Location: [facility];  Service: Orthopedics;  Laterality: Right;  . SHOULDER ARTHROSCOPY WITH OPEN ROTATOR CUFF REPAIR Left 07/08/2017   Procedure: SHOULDER ARTHROSCOPY WITH OPEN ROTATOR CUFF REPAIR;  Surgeon: Corky Mull, MD;  Location: [facility] ORS;  Service: Orthopedics;  Laterality: Left;    SOCIAL HISTORY: Social History   Socioeconomic History  . Marital status: Married    Spouse name: Not on file  . Number of children: Not on file  . Years of education: Not on file  . Highest education level: Not on file  Occupational History  . Not on file  Social Needs  . Financial resource strain: Not on file  . Food insecurity:    Worry: Not on file    Inability: Not on file  . Transportation needs:    Medical: Not on file    Non-medical: Not on file  Tobacco Use  . Smoking status: Former Smoker    Packs/day: 0.50    Types: Cigarettes    Last attempt to quit: 06/1978    Years since quitting: 39.9  . Smokeless tobacco: Former Systems developer    Quit date: 03/13/1972  Substance and Sexual Activity  . Alcohol use: No  . Drug use: No  . Sexual activity: Not on file  Lifestyle  . Physical activity:    Days per week: Not on file    Minutes per session: Not on file  . Stress: Not on file  Relationships  . Social connections:    Talks on phone: Not on file    Gets together: Not on file    Attends religious service: Not on file    Active member of club or organization: Not on file    Attends meetings of clubs or organizations: Not on file    Relationship status: Not on file  . Intimate partner violence:    Fear of current or ex partner: Not on file    Emotionally abused: Not on file    Physically abused: Not on file    Forced sexual activity: Not on file  Other Topics Concern  . Not on file  Social History Narrative   Independent at baseline. Ambulates with a cane. Lives at home with  his wife    FAMILY HISTORY: Family History  Problem Relation Age of Onset  . Hypertension Mother   . Cancer Father   . Heart disease Sister   . Diabetes Brother   . Anesthesia problems Neg Hx     ALLERGIES:  is allergic to oxycodone-acetaminophen; buchu-cornsilk-ch grass-hydran; colchicine; nifedipine; and tramadol.  MEDICATIONS:  Current Outpatient Medications  Medication Sig Dispense Refill  . albuterol (PROVENTIL HFA;VENTOLIN HFA) 108 (90 Base) MCG/ACT inhaler Inhale 1-2 puffs into the lungs every 6 (six) hours as needed for wheezing or shortness of breath.     . allopurinol (ZYLOPRIM) 100 MG tablet Take 300 mg by mouth daily.     Marland Kitchen amLODipine (NORVASC) 5 MG tablet Take 5 mg by mouth daily.     Marland Kitchen aspirin EC 81 MG  EC tablet Take 1 tablet (81 mg total) by mouth daily. 120 tablet 0  . atorvastatin (LIPITOR) 80 MG tablet Take 80 mg by mouth daily. Take in the morning.    . carvedilol (COREG) 12.5 MG tablet Take 12.5 mg by mouth 2 (two) times daily with a meal.    . Cholecalciferol (VITAMIN D-1000 MAX ST) 1000 units tablet Take 1,000 Units by mouth daily.    . cloNIDine (CATAPRES) 0.1 MG tablet Take 0.1 mg by mouth 3 (three) times daily.    . Cranberry 400 MG CAPS Take 400 mg by mouth daily.    . DESCOVY 200-25 MG tablet Take 1 tablet by mouth at bedtime.   3  . diazepam (VALIUM) 5 MG tablet Take 5 mg by mouth at bedtime.     . dolutegravir (TIVICAY) 50 MG tablet Take 50 mg by mouth at bedtime.     . donepezil (ARICEPT) 10 MG tablet Take by mouth.    . ferrous sulfate 325 (65 FE) MG tablet Take 325 mg by mouth daily with breakfast.     . folic acid (FOLVITE) 1 MG tablet Take 1 mg by mouth daily. In the morning    . glimepiride (AMARYL) 2 MG tablet Take 2 mg by mouth daily with breakfast.    . HYDROcodone-acetaminophen (NORCO/VICODIN) 5-325 MG tablet Directions are take one tablet 30 minutes before physical therapy, then take one tablet at bedtime as needed for pain    . isosorbide  mononitrate (IMDUR) 30 MG 24 hr tablet Take 1 tablet (30 mg total) by mouth daily. 30 tablet 5  . lidocaine (XYLOCAINE) 5 % ointment Apply 1 application topically daily as needed for mild pain.     Marland Kitchen losartan (COZAAR) 100 MG tablet Take 100 mg by mouth every morning.   0  . nystatin (MYCOSTATIN) 100000 UNIT/ML suspension Take 5 mLs by mouth 4 (four) times daily as needed for mouth pain.    . Omega-3 Fatty Acids (FISH OIL PO) Take by mouth.    . ondansetron (ZOFRAN) 4 MG tablet TAKE 1 TABLET BY MOUTH EVERY 8 HOURS IF NEEDED FOR NAUSEA  0  . pantoprazole (PROTONIX) 40 MG tablet Take 1 tablet by mouth daily.    . sertraline (ZOLOFT) 50 MG tablet Take 50 mg by mouth every evening.     Marland Kitchen tiZANidine (ZANAFLEX) 4 MG tablet Take 0.5 tablets (2 mg total) by mouth 2 (two) times daily. 30 tablet 0  . torsemide (DEMADEX) 20 MG tablet Take 1 tablet (20 mg total) by mouth daily. 30 tablet 5   No current facility-administered medications for this visit.       Marland Kitchen  PHYSICAL EXAMINATION:   Vitals:   05/04/18 1054  BP: (!) 143/83  Pulse: 63  Resp: 16   Filed Weights   05/04/18 1054  Weight: 225 lb (102.1 kg)    Physical Exam  Constitutional: He is oriented to person, place, and time and well-developed, well-nourished, and in no distress.  Is alone.  HENT:  Head: Normocephalic and atraumatic.  Mouth/Throat: Oropharynx is clear and moist. No oropharyngeal exudate.  Eyes: Pupils are equal, round, and reactive to light.  Neck: Normal range of motion. Neck supple.  Cardiovascular: Normal rate and regular rhythm.  Pulmonary/Chest: No respiratory distress. He has no wheezes.  Abdominal: Soft. Bowel sounds are normal. He exhibits no distension and no mass. There is no tenderness. There is no rebound and no guarding.  Musculoskeletal: Normal range of motion. He exhibits  no edema or tenderness.  Neurological: He is alert and oriented to person, place, and time.  Skin: Skin is warm.  Psychiatric:  Affect normal.     LABORATORY DATA:  I have reviewed the data as listed Lab Results  Component Value Date   WBC 8.2 05/04/2018   HGB 13.5 05/04/2018   HCT 40.7 05/04/2018   MCV 88.5 05/04/2018   PLT 146 (L) 05/04/2018   Recent Labs    07/05/17 1150 07/17/17 1657 05/04/18 1025  NA 140 142 143  K 4.4 3.6 3.8  CL 107 104 106  CO2 25 30 27   GLUCOSE 213* 187* 150*  BUN 20 18 16   CREATININE 2.02* 1.68* 1.75*  CALCIUM 9.8 9.5 9.7  GFRNONAA 33* 41* 40*  GFRAA 38* 48* 46*  PROT  --  7.7  --   ALBUMIN  --  3.7  --   AST  --  31  --   ALT  --  35  --   ALKPHOS  --  73  --   BILITOT  --  1.1  --      ASSESSMENT & PLAN:   Anemia due to stage 3 chronic kidney disease (HCC) #Iron deficient anemia secondary to CKD-on p.o. iron twice a day.  #Today hemoglobin 13.7; continue p.o. iron.  No IV iron at this time.  # CKD stage III-creatinine 1.7 stable.  #HIV-under good control as per patient; on retrovirals.  Stable.  # DISPOSITION: # Recommend follow-up in 6 months -MD/labs-cbc/cmp/iron studoes/ferritin-Dr.B      Cammie Sickle, MD 05/10/2018 9:56 AM

## 2018-05-04 NOTE — Assessment & Plan Note (Addendum)
#  Iron deficient anemia secondary to CKD-on p.o. iron twice a day.  #Today hemoglobin 13.7; continue p.o. iron.  No IV iron at this time.  # CKD stage III-creatinine 1.7 stable.  #HIV-under good control as per patient; on retrovirals.  Stable.  # DISPOSITION: # Recommend follow-up in 6 months -MD/labs-cbc/cmp/iron studoes/ferritin-Dr.B

## 2018-05-20 ENCOUNTER — Ambulatory Visit: Payer: Medicare Other | Attending: Neurology

## 2018-05-20 DIAGNOSIS — R413 Other amnesia: Secondary | ICD-10-CM | POA: Diagnosis not present

## 2018-05-20 DIAGNOSIS — G4733 Obstructive sleep apnea (adult) (pediatric): Secondary | ICD-10-CM | POA: Diagnosis not present

## 2018-05-20 DIAGNOSIS — R0683 Snoring: Secondary | ICD-10-CM | POA: Insufficient documentation

## 2018-05-26 NOTE — Unmapped (Addendum)
[facility] Refill Coordination Note  Specialty Medication(s): Tivicay 50 mg, Descovy 200-25 mg  Additional Medications shipped: none    Samuel [name], DOB: March 28, 1951  Phone: [phone] (home) , Alternate phone contact: N/A  Phone or address changes today?: No  All above HIPAA information was verified with patient.  Shipping Address: [address]  [place] [address]   Insurance changes? No    Completed refill call assessment today to schedule patient's medication shipment from the [facility] [phone]).      Confirmed the medication and dosage are correct and have not changed: Patient declines to answer.    Confirmed patient started or stopped the following medications in the past month:  No, there are no changes reported at this time.    Are you tolerating your medication?:  Samuel [name] reports tolerating the medication.    ADHERENCE    Did you miss any doses in the past 4 weeks? No missed doses reported.    FINANCIAL/SHIPPING    Delivery Scheduled: Yes, Expected medication delivery date: 05/30/18   *ADDENDA: Due to grant billing issues, delivery will be delayed until 05/31/18, patient is aware of this and ok with that delivery date.*    Medication will be delivered via Same Day Courier to the home address in [facility].    The patient will receive a drug information handout for each medication shipped and additional FDA Medication Guides as required.      Samuel [name] did not have any additional questions at this time.    We will follow up with patient monthly for standard refill processing and delivery.      Thank you,  Verdia Kuba   [facility] Specialty Pharmacist

## 2018-05-30 NOTE — Unmapped (Signed)
Samuel [name] 's DESCOVY AND TIVICAY shipment will be delayed due to Cost Increase BUT GRANTS NOW APPROVED We have contacted the patient and left a message We will wait for a call back from the patient/caregiver to reschedule the delivery.  We have confirmed the delivery date as N/A .

## 2018-05-31 MED FILL — DESCOVY 200 MG-25 MG TABLET: ORAL | 30 days supply | Qty: 30 | Fill #4

## 2018-05-31 MED FILL — TIVICAY 50 MG TABLET: ORAL | 30 days supply | Qty: 30 | Fill #4

## 2018-05-31 MED FILL — DESCOVY 200 MG-25 MG TABLET: 30 days supply | Qty: 30 | Fill #4 | Status: AC

## 2018-05-31 MED FILL — TIVICAY 50 MG TABLET: 30 days supply | Qty: 30 | Fill #4 | Status: AC

## 2018-06-07 NOTE — Unmapped (Signed)
Assessment/Plan:            .   HIV:  Viral load detectable, but <40 on current ART (Descovy/dolutegravir), which he tolerates well. I reviewed chart in anticipation of today's visit. We discussed labs.  Will continue current regimen. Cirrhosis and renal insufficiency continue to be his biggest current medical problems. Of note, colonoscopy screening has been ordered many times in the past, but pt reports that he has never been contacted. He now says that he prefers to have colonoscopy done outside [facility] (through request by his PCP).    Obesity: Nutrition counseling requested.    S/p L shoulder surgery:  Followed by his PCP.    Cirrhosis: Followed by GI.     SVR following Zepatier for Chronic HCV Infection (genotype 1a): Stable.    CKD: Stage 3 -- Followed by Dr Stefano Gaul.    HTN: Stable.    Sexual Health:     Mental Health: Stable.    Health maintenance:   Pap N/A  RPR False + (1:2 with neg FTA 11/12/2015)  GC/CT - neg 08/26/2016  Hb A1C 11/12/2015: 5.7   Cholesterol 11/12/2015: total cholesterol 148, HDL 37, LDL (calc) 80, triglycerides 155  TB screening neg, 06/10/2016  Lung cancer screening N/A (quit 1993 after 1 ppd x 10 yrs)  Colonoscopy - Requested for screening    Pneumococcal and second Shingrix vaccine requested today.        Prevention, adherence and health education:   .  .  .  .  .  .  .    Educational and counseling services took 25  minutes of today's visit.    Follow-up:  Return to clinic in 1 month or sooner if needed.   Subjective:    Samuel [name] is a 68 y.o. male who presents to the Infectious Disease clinic for return HIV visit.     Chief Complaint: HIV Positive/AIDS and Routine Follow-up      HPI:   HIV diagnosed in 12/1999. In 07/13/00, CD4 count was 547 (25%) with a viral load of about 5293. For a number of years, his viral load and CD4 count were generally reasonably well maintained in the absence of antiretroviral therapy, and he never had any HIV-related symptoms. However, during 2008-2009, his viral load dramatically increased and his CD4 count fell. Viral load reached a height of 309,000 on 08/31/2007 and CD4 count fell substantially to 244 on 09/29/2007. He had always been reluctant to take antiviral medications and in fact consistently and repeatedly refused hepatitis C treatment as well as HIV treatment in the past. However, because of the deterioration of his CD4 count and viral load, I prescribed Atripla on 10/12/07. Although he agreed to take it he did not done so until more than a year later, despite assurances during each visit that he would begin ART immediately. Once he finally started ART, however, he tolerated it quite well. On 07/24/2015 he was switched from Atripla to Descovy/dolutegravir b/o CKD.     His HIV has been well controlled, but he has multiple severe comorbidities, including HCV (SVR after 12 wks of Zepatier 04/20/2016-07/13/2016; HCV RNA BDL 10/28/2016), resultant cirrhosis (Metavir F4), CKD Stage 3, NSTEMI 10/2015. He is followed closely by Nephrology, GI, and Cardiology. His course has been eventful during the past few months. He was seen in 03/2018 by Dr Stefano Gaul (Renal) and felt to be doing well. 11/2017 Abd US revealed no liver lesions.    Underwent L rotator cuff repair in  07/2017. Has generally been doing well, although still has trouble lifting arm - a problem, since he's L handed. No hospitalizations since I last saw him.    Has missed maybe only 1 dose of ART since I last saw him -- again because he fell asleep. I suggested that he consider taking it in the AM.     ROS otherwise unremarkable.         Past Medical History:   Diagnosis Date   ??? CKD (chronic kidney disease) stage 3, GFR 30-59 ml/min (CMS-HCC) 01/08/2015   ??? Coronary artery disease 10/2015    Multivessel disease, plan medical management after cath 6/17   ??? GERD (gastroesophageal reflux disease)    ??? Gout    ??? HIV (human immunodeficiency virus infection) (CMS-HCC)     Undectable viral load 5/17   ??? Hypertension    ??? Nephrolithiasis    ??? Nephrotic syndrome 03/05/2014   ??? Renal mass     Followed by neprhology, MRI 8/16 improved, felt to be benign       Medications:  Current Outpatient Medications   Medication Sig Dispense Refill   ??? allopurinol (ZYLOPRIM) 100 MG tablet Take 2 tablets (200 mg total) by mouth daily. 180 tablet 3   ??? amLODIPine (NORVASC) 2.5 MG tablet Take 1 tablet (2.5 mg total) by mouth every morning before breakfast. 90 tablet 3   ??? aspirin (ECOTRIN) 81 MG tablet   0   ??? atorvastatin (LIPITOR) 20 MG tablet TAKE 1 TABLET (20 MG) BY MOUTH DAILY 90 tablet 3   ??? baclofen (LIORESAL) 10 MG tablet Take 1 tablet (10 mg total) by mouth Three (3) times a day as needed for muscle spasms. 30 tablet 2   ??? calcium carbonate-vitamin D2 500 mg(1,250mg ) -200 unit tablet Take 1 tablet by mouth Two (2) times a day.     ??? carvedilol (COREG) 12.5 MG tablet Take 1 tablet (12.5 mg total) by mouth Two (2) times a day. 60 tablet 0   ??? cefuroxime (CEFTIN) 250 MG tablet   0   ??? cloNIDine HCl (CATAPRES) 0.1 MG tablet Take 0.1 mg by mouth 3 (three) times a day.     ??? cranberry 400 mg cap Take 400 mg by mouth daily.     ??? cyanocobalamin 1000 MCG tablet Take 1,000 mcg by mouth.     ??? diazepam (VALIUM) 5 MG tablet Take 5 mg by mouth nightly.      ??? dolutegravir (TIVICAY) 50 mg Tab TABLET TAKE 1 TABLET BY MOUTH DAILY 30 each 11   ??? emtricitabine-tenofovir alafen (DESCOVY) 200-25 mg tablet TAKE 1 TABLET BY MOUTH DAILY 30 tablet 11   ??? ferrous sulfate 325 (65 FE) MG tablet Take 325 mg by mouth daily with breakfast.      ??? folic acid (FOLVITE) 1 MG tablet   0   ??? glimepiride (AMARYL) 1 MG tablet take 2 tablet by mouth once daily with BREAKFAST  0   ??? HYDROcodone-acetaminophen (NORCO) 5-325 mg per tablet Directions are take one tablet 30 minutes before physical therapy, then take one tablet at bedtime as needed for pain     ??? lidocaine (XYLOCAINE) 5 % ointment Apply 1 application topically daily as needed.     ??? losartan (COZAAR) 100 MG tablet Take 100 mg by mouth.     ??? naproxen (NAPROSYN) 500 MG tablet TK 1 T PO BID WF  0   ??? nitroglycerin (NITROSTAT) 0.4 MG SL tablet Place 1 tablet (  0.4 mg total) under the tongue every five (5) minutes as needed for chest pain. 25 tablet 4   ??? nystatin (MYCOSTATIN) 100,000 unit/mL suspension Take 5 mL by mouth.     ??? omega-3 fatty acids-fish oil 340-1,000 mg capsule Take 1 capsule by mouth.     ??? omeprazole (PRILOSEC) 20 MG capsule Take 1 capsule (20 mg total) by mouth daily. 90 capsule 3   ??? ondansetron (ZOFRAN) 4 MG tablet take 1 tablet by mouth every 8 hours if needed for nausea     ??? oxyCODONE-acetaminophen (PERCOCET) 5-325 mg per tablet Take 1 tablet by mouth.     ??? sertraline (ZOLOFT) 50 MG tablet take 1 tablet by mouth once daily     ??? spironolactone (ALDACTONE) 25 MG tablet Take 1 tablet (25 mg total) by mouth daily. 30 tablet 0   ??? tamsulosin (FLOMAX) 0.4 mg capsule Take 1 capsule (0.4 mg total) by mouth daily. 90 capsule 3   ??? albuterol (PROVENTIL HFA;VENTOLIN HFA) 90 mcg/actuation inhaler Inhale 2 puffs.     ??? docusate sodium (COLACE) 100 MG capsule Take 1 capsule (100 mg total) by mouth every twelve (12) hours. 60 capsule 0   ??? predniSONE (DELTASONE) 10 MG tablet Take one tablet daily by mouth for the next five days.     ??? tiZANidine (ZANAFLEX) 4 MG tablet Take 2 mg by mouth.     ??? torsemide (DEMADEX) 20 MG tablet Take 2 tablets (40 mg total) by mouth daily. 90 tablet 3     No current facility-administered medications for this visit.        Allergies: Colchicine analogues and Tramadol    Social History:  As per MEDICAL RECORD NUMBERNo cigs or alcohol at all. No current IDU (quit many years ago). Lives with wife.     Review of Systems:  A 12 point review of systems was negative except for pertinent items noted in the HPI.    Objective:       BP 141/85  - Pulse 68  - Temp 36.4 ??C (97.6 ??F) (Oral)  - Ht 180.4 cm (5' 11.02)  - Wt (!) 103 kg (227 lb)  - BMI 31.64 kg/m??     GEN:  looks well, no apparent distress  EYES: sclerae anicteric and non injected  ZOX:WRUEAVWU on top and bottom. No lesions.  LYMPH:no cervical or supraclavicular LAD  CV:no peripheral edema, normal pulses. Nl s1, s2, without s3, s4 +2/6 sys m diffusely over precordium (except no radiation to neck or axilla)  PULM:CTAB ant/post, normal work of breathing  ABD: Protuberant - but not taut, nontender, no masses felt.  JW:JXBJYNWG  RECTAL:deferred  SKIN:no petechiae, ecchymoses or obvious rashes on clothed exam  MSK: L shoulder abduction limited by pain; 1+ pitting edema bilat  NEURO:CN II-XII intact  PSYCH:attentive, appropriate affect, good eye contact, fluent speech    Recent Labs:    Lab Results   Component Value Date    RPR Reactive (A) 11/12/2015    RPR 1:2 01/03/2014    A1C 5.7 11/12/2015    CHOL 111 09/16/2016    CHOL 148 11/12/2015    CHOL 120 07/24/2015    LDL 39 (L) 09/16/2016    LDL 80 11/12/2015    LDL 46 (L) 07/24/2015    HDL 32 (L) 09/16/2016    HDL 37 (L) 11/12/2015    HDL 52 07/24/2015    TRIG 956 (H) 09/16/2016    TRIG 155 (H) 11/12/2015  TRIG 111 07/24/2015    CHOLHDLRATIO 3.5 09/16/2016    CHOLHDLRATIO 4.0 11/12/2015    CHOLHDLRATIO 2.3 07/24/2015    QFTTBGOLD Negative 06/10/2016         Absolute CD4 Count   Date Value Ref Range Status   09/01/2017 417 (L) 510-2,320 /uL Final   04/02/2017 290 (L) 510-2,320 /uL Final   09/16/2016 547 510 - 2,320 /uL Final   03/11/2016 551 510 - 2,320 /uL Final   08/01/2014 514 510 - 2,320 /uL Final   01/03/2014 430 (L) 510 - 2,320 /uL Final   11/23/2012 549 510 - 2,320 /uL Final   07/20/2012 470 (L) 510 - 2,320 CELLS/UL Final     CD4% (T Helper)   Date Value Ref Range Status   09/01/2017 27 (L) 34 - 58 % Final   04/02/2017 29 (L) 34 - 58 % Final   09/16/2016 28 (L) 34 - 58 % Final   03/11/2016 29 (L) 34 - 58 % Final   08/01/2014 35 34 - 58 % Final   01/03/2014 34 34 - 58 % Final   11/23/2012 31 (L) 34 - 58 % Final   07/20/2012 29 (L) 34 - 58 % OF LYMPHS Final     HIV RNA Quant Result   Date Value Ref Range Status   12/01/2017 Detected (A) Not Detected Final   09/01/2017 Detected (A) Not Detected Final   04/02/2017 Detected (A) Not Detected Final   09/16/2016 Detected (A) Not Detected Final   08/15/2014 Not Detected  Final   01/03/2014 Not Detected  Final   11/23/2012 Not Detected  Final   07/20/2012 Not Detected  Final     HIV RNA   Date Value Ref Range Status   12/01/2017 <40 (H) <0 copies/mL Final   09/01/2017 <40 (H) <0 copies/mL Final   04/02/2017 <40 (H) <0 copies/mL Final   09/16/2016 <40 (H) <0 copies/mL Final     HIV RNA Log(10)   Date Value Ref Range Status   12/01/2017  <0.00 log copies/mL Final     Comment:     <1.6 log   09/01/2017  <0.00 log copies/mL Final     Comment:     <1.6 log   04/02/2017  <0.00 log copies/mL Final     Comment:     <1.6 log   09/16/2016  <0.00 log copies/mL Final     Comment:     <1.6 log         Absolute CD4 Count   Date Value Ref Range Status   09/01/2017 417 (L) 510-2,320 /uL Final   04/02/2017 290 (L) 510-2,320 /uL Final   09/16/2016 547 510 - 2,320 /uL Final   03/11/2016 551 510 - 2,320 /uL Final   08/01/2014 514 510 - 2,320 /uL Final   01/03/2014 430 (L) 510 - 2,320 /uL Final   11/23/2012 549 510 - 2,320 /uL Final   07/20/2012 470 (L) 510 - 2,320 CELLS/UL Final     HIV RNA   Date Value Ref Range Status   12/01/2017 <40 (H) <0 copies/mL Final   09/01/2017 <40 (H) <0 copies/mL Final   04/02/2017 <40 (H) <0 copies/mL Final   09/16/2016 <40 (H) <0 copies/mL Final      Lab Results   Component Value Date    WBC 8.0 06/08/2018    WBC 8.4 08/01/2014    HGB 14.8 06/08/2018    HGB 11.1 (L) 08/01/2014    Platelet 195 06/08/2018  Platelet 181 08/01/2014    Creatinine 1.65 (H) 06/08/2018    Creatinine 1.68 (H) 08/15/2014    AST 39 06/08/2018    AST 50 08/01/2014    ALT 46 06/08/2018    ALT 60 08/01/2014    Triglycerides 198 (H) 09/16/2016    Triglycerides 80 01/03/2014    HDL 32 (L) 09/16/2016    HDL 53 01/03/2014    LDL Calculated 39 (L) 09/16/2016    LDL Cholesterol, Calculated 71 01/03/2014       Immunization History   Administered Date(s) Administered   ??? Hepatitis B, Adult 04/06/2016, 05/21/2016, 08/24/2016   ??? INFLUENZA TIV (TRI) PF (IM) 02/08/2008, 03/26/2010, 03/25/2011   ??? Influenza Vaccine Quad (IIV4 PF) 64mo+ injectable 07/24/2015, 02/05/2016, 03/08/2018   ??? Influenza Virus Vaccine, unspecified formulation 06/15/2014, 07/24/2015, 02/05/2016, 03/15/2016, 03/31/2017   ??? PNEUMOCOCCAL POLYSACCHARIDE 23 07/13/2000, 08/19/2005, 12/01/2017   ??? PPD Test 12/29/2006, 07/11/2008, 03/26/2010, 03/25/2011   ??? Pneumococcal Conjugate 13-Valent 04/13/2012   ??? SHINGRIX-ZOSTER VACCINE (HZV), RECOMBINANT,SUB-UNIT,ADJUVANTED IM 09/16/2016, 12/01/2017   ??? TdaP 08/31/2007   ??? Tuberculin Skin Test;unspecified Formulation 12/29/2006, 07/11/2008, 03/26/2010, 03/25/2011

## 2018-06-08 ENCOUNTER — Ambulatory Visit
Admit: 2018-06-08 | Discharge: 2018-06-09 | Payer: MEDICARE | Attending: Infectious Disease | Primary: Infectious Disease

## 2018-06-08 DIAGNOSIS — Z131 Encounter for screening for diabetes mellitus: Secondary | ICD-10-CM

## 2018-06-08 DIAGNOSIS — Z113 Encounter for screening for infections with a predominantly sexual mode of transmission: Secondary | ICD-10-CM

## 2018-06-08 DIAGNOSIS — I10 Essential (primary) hypertension: Secondary | ICD-10-CM

## 2018-06-08 DIAGNOSIS — R799 Abnormal finding of blood chemistry, unspecified: Secondary | ICD-10-CM

## 2018-06-08 DIAGNOSIS — B20 Human immunodeficiency virus [HIV] disease: Principal | ICD-10-CM

## 2018-06-08 LAB — BASIC METABOLIC PANEL
ANION GAP: 10 mmol/L (ref 7–15)
BLOOD UREA NITROGEN: 16 mg/dL (ref 7–21)
BUN / CREAT RATIO: 10
CALCIUM: 10.3 mg/dL — ABNORMAL HIGH (ref 8.5–10.2)
CHLORIDE: 102 mmol/L (ref 98–107)
CREATININE: 1.65 mg/dL — ABNORMAL HIGH (ref 0.70–1.30)
EGFR CKD-EPI AA MALE: 49 mL/min/{1.73_m2} — ABNORMAL LOW (ref >=60)
EGFR CKD-EPI NON-AA MALE: 43 mL/min/{1.73_m2} — ABNORMAL LOW (ref >=60)
GLUCOSE RANDOM: 146 mg/dL (ref 70–179)
POTASSIUM: 4.6 mmol/L (ref 3.5–5.0)
SODIUM: 142 mmol/L (ref 135–145)

## 2018-06-08 LAB — AST (SGOT): Aspartate aminotransferase:CCnc:Pt:Ser/Plas:Qn:: 39

## 2018-06-08 LAB — CBC W/ AUTO DIFF
BASOPHILS ABSOLUTE COUNT: 0.1 10*9/L (ref 0.0–0.1)
EOSINOPHILS RELATIVE PERCENT: 2 %
HEMATOCRIT: 45.8 % (ref 41.0–53.0)
HEMOGLOBIN: 14.8 g/dL (ref 13.5–17.5)
LARGE UNSTAINED CELLS: 2 % (ref 0–4)
LYMPHOCYTES ABSOLUTE COUNT: 2.1 10*9/L (ref 1.5–5.0)
LYMPHOCYTES RELATIVE PERCENT: 25.8 %
MEAN CORPUSCULAR HEMOGLOBIN CONC: 32.3 g/dL (ref 31.0–37.0)
MEAN CORPUSCULAR HEMOGLOBIN: 30.2 pg (ref 26.0–34.0)
MEAN CORPUSCULAR VOLUME: 93.7 fL (ref 80.0–100.0)
MEAN PLATELET VOLUME: 8.2 fL (ref 7.0–10.0)
MONOCYTES ABSOLUTE COUNT: 0.4 10*9/L (ref 0.2–0.8)
MONOCYTES RELATIVE PERCENT: 5.5 %
NEUTROPHILS ABSOLUTE COUNT: 5.1 10*9/L (ref 2.0–7.5)
NEUTROPHILS RELATIVE PERCENT: 63.5 %
PLATELET COUNT: 195 10*9/L (ref 150–440)
RED BLOOD CELL COUNT: 4.89 10*12/L (ref 4.50–5.90)
RED CELL DISTRIBUTION WIDTH: 13.9 % (ref 12.0–15.0)
WBC ADJUSTED: 8 10*9/L (ref 4.5–11.0)

## 2018-06-08 LAB — ALT (SGPT): Alanine aminotransferase:CCnc:Pt:Ser/Plas:Qn:: 46

## 2018-06-08 LAB — HEMOGLOBIN A1C: Hemoglobin A1c/Hemoglobin.total:MFr:Pt:Bld:Qn:: 6.8 — ABNORMAL HIGH

## 2018-06-08 LAB — BLOOD UREA NITROGEN: Urea nitrogen:MCnc:Pt:Ser/Plas:Qn:: 16

## 2018-06-08 LAB — HYPOCHROMIA

## 2018-06-08 LAB — BILIRUBIN TOTAL: Bilirubin:MCnc:Pt:Ser/Plas:Qn:: 1.1

## 2018-06-08 NOTE — Unmapped (Signed)
PLEASE:  1. FOLLOW UP WITH DR HLADIK AND LIVER DOCTORS.  2. PLEASE ASK PRIMARY CARE DOCTOR TO SCHEDULE SCREENING COLONOSCOPY.

## 2018-06-09 LAB — HIV RNA, QUANTITATIVE, PCR

## 2018-06-09 LAB — SYPHILIS RPR SCREEN: Reagin Ab:PrThr:Pt:Ser:Ord:RPR: NONREACTIVE

## 2018-06-09 LAB — HIV RNA COMMENT: Lab: 0

## 2018-06-11 LAB — QUANTIFERON MITOGEN: Lab: >10

## 2018-06-11 LAB — QUANTIFERON TB GOLD PLUS
QUANTIFERON ANTIGEN 2 MINUS NIL: -0.06 [IU]/mL
QUANTIFERON TB GOLD PLUS: NEGATIVE
QUANTIFERON TB NIL VALUE: 0.43 [IU]/mL

## 2018-06-21 NOTE — Unmapped (Signed)
[facility] Refill and Clinical Coordination Note: HIV     HIV Medication(s): Descovy and Tivicay  Additional Medication(s): atorvastatin 20mg     Samuel [name], DOB: [birth year]  Phone: [phone] (home) , Alternate phone contact: N/A  Shipping address: [address]  [name] [address]  Phone or address changes today?: No  All above HIPAA information verified.  Insurance changes? No    Completed refill and clinical call assessment today to schedule patient's medication shipment from the [facility] [phone]).      MEDICATION RECONCILIATION    Confirmed the medication and dosage are correct and have not changed: Yes, regimen is correct and unchanged.    Were there any changes to your medication(s) in the past month:  No, there are no changes reported at this time.    HIV-related labs:  Lab Results   Component Value Date/Time    HIVRS Not Detected 06/08/2018 10:18 AM    HIVRS Detected (A) 12/01/2017 10:16 AM    HIVRS Detected (A) 09/01/2017 11:34 AM    HIVRS Not Detected 08/15/2014 11:42 AM    HIVRS Not Detected 01/03/2014 10:46 AM    HIVRS Not Detected 11/23/2012 12:53 PM    HIVCP <40 (H) 12/01/2017 10:16 AM    HIVCP <40 (H) 09/01/2017 11:34 AM    HIVCP <40 (H) 04/02/2017 12:44 PM    HIV10  12/01/2017 10:16 AM      Comment:      <1.6 log    HIV10  09/01/2017 11:34 AM      Comment:      <1.6 log    HIV10  04/02/2017 12:44 PM      Comment:      <1.6 log    HIVCM  06/08/2018 10:18 AM      Comment:      HIV-1 quantification by real-time RT-PCR is performed using the Abbott RealTime  HIV-1 test. This test is FDA approved and can quantitate HIV-1 RNA over the  range of 40 - 1,000,000 copies/mL (1.60 log(10) -6.00 log(10) copies/mL). The reference range for this assay is Not Detected.    HIVCM  12/01/2017 10:16 AM      Comment:      HIV-1 quantification by real-time RT-PCR is performed using the Abbott RealTime  HIV-1 test. This test is FDA approved and can quantitate HIV-1 RNA over the  range of 40 - 1,000,000 copies/mL (1.60 log(10) -6.00 log(10) copies/mL). The reference range for this assay is Not Detected.    HIVCM  09/01/2017 11:34 AM      Comment:      HIV-1 quantification by real-time RT-PCR is performed using the Abbott RealTime  HIV-1 test. This test is FDA approved and can quantitate HIV-1 RNA over the  range of 40 - 1,000,000 copies/mL (1.60 log(10) -6.00 log(10) copies/mL). The reference range for this assay is Not Detected.    HIVCM : 08/15/2014 11:42 AM    HIVCM : 01/03/2014 10:46 AM    HIVCM : 11/23/2012 12:53 PM    ACD4 417 (L) 09/01/2017 11:34 AM    ACD4 290 (L) 04/02/2017 12:44 PM    ACD4 547 09/16/2016 11:05 AM    ACD4 514 08/01/2014 09:35 AM    ACD4 430 (L) 01/03/2014 10:46 AM    ACD4 549 11/23/2012 12:53 PM         ADHERENCE      Number of tablets of HIV Medication(s) at home: 7 tablets of both Descovy and Tivicay  Did you miss any doses in the past 4 weeks? No missed doses reported.  Adherence counseling provided? Not needed     SIDE EFFECT MANAGEMENT    Are you tolerating your medication?:  Samuel [name] reports tolerating the medication.  Side effect management discussed: None      Evidence of clinical benefit: See Epic note from 06/08/2018      Therapy is appropriate and should be continued.      FINANCIAL/SHIPPING    Delivery Scheduled: Yes, Expected medication delivery date: 06/23/2018     Medication will be delivered via Next Day Courier to the prescription address in [place].    Additional medications refilled: No additional medications/refills needed at this time.    The patient will receive a drug information handout for each medication shipped and additional FDA Medication Guides as required.      Samuel [name] did not have any additional questions at this time.    Delivery address confirmed in Epic.     We will follow up with patient monthly for standard refill processing and delivery.      Thank you,  Roderic Palau   [facility] Specialty Pharmacist

## 2018-06-22 NOTE — Unmapped (Addendum)
Samuel [name] 's DESCOVY, TIVICAY & LIPITOR shipment will be delayed due to Refill too soon until 1/24 We have contacted the patient and left a message We will call the patient to reschedule the delivery upon resolution. We have confirmed the delivery date as 06/24/18 via [facility] same day courier .

## 2018-06-24 MED FILL — DESCOVY 200 MG-25 MG TABLET: 30 days supply | Qty: 30 | Fill #5 | Status: AC

## 2018-06-24 MED FILL — TIVICAY 50 MG TABLET: ORAL | 30 days supply | Qty: 30 | Fill #5

## 2018-06-24 MED FILL — ATORVASTATIN 20 MG TABLET: 90 days supply | Qty: 90 | Fill #1

## 2018-06-24 MED FILL — ATORVASTATIN 20 MG TABLET: 90 days supply | Qty: 90 | Fill #1 | Status: AC

## 2018-06-24 MED FILL — TIVICAY 50 MG TABLET: 30 days supply | Qty: 30 | Fill #5 | Status: AC

## 2018-06-24 MED FILL — DESCOVY 200 MG-25 MG TABLET: ORAL | 30 days supply | Qty: 30 | Fill #5

## 2018-06-27 NOTE — Unmapped (Signed)
Please see dictated note

## 2018-06-28 ENCOUNTER — Ambulatory Visit: Admit: 2018-06-28 | Discharge: 2018-06-29 | Payer: MEDICARE | Attending: Nephrology | Primary: Nephrology

## 2018-06-28 DIAGNOSIS — N2889 Other specified disorders of kidney and ureter: Secondary | ICD-10-CM

## 2018-06-28 DIAGNOSIS — N183 Chronic kidney disease, stage 3 (moderate): Secondary | ICD-10-CM

## 2018-06-28 DIAGNOSIS — B20 Human immunodeficiency virus [HIV] disease: Secondary | ICD-10-CM

## 2018-06-28 DIAGNOSIS — D649 Anemia, unspecified: Secondary | ICD-10-CM

## 2018-06-28 DIAGNOSIS — I151 Hypertension secondary to other renal disorders: Principal | ICD-10-CM

## 2018-06-28 DIAGNOSIS — M1000 Idiopathic gout, unspecified site: Secondary | ICD-10-CM

## 2018-06-28 DIAGNOSIS — I214 Non-ST elevation (NSTEMI) myocardial infarction: Secondary | ICD-10-CM

## 2018-06-28 DIAGNOSIS — I5033 Acute on chronic diastolic (congestive) heart failure: Secondary | ICD-10-CM

## 2018-06-28 DIAGNOSIS — K7469 Other cirrhosis of liver: Secondary | ICD-10-CM

## 2018-06-28 DIAGNOSIS — I2510 Atherosclerotic heart disease of native coronary artery without angina pectoris: Secondary | ICD-10-CM

## 2018-06-28 LAB — ALBUMIN / CREATININE URINE RATIO: CREATININE, URINE: 439.9 mg/dL

## 2018-06-28 LAB — ALBUMIN QUANT URINE: Albumin:MCnc:Pt:Urine:Qn:: 0

## 2018-06-28 MED ORDER — CLONIDINE HCL 0.1 MG TABLET
ORAL_TABLET | Freq: Three times a day (TID) | ORAL | 3 refills | 0.00000 days | Status: CP
Start: 2018-06-28 — End: 2018-10-18

## 2018-06-28 NOTE — Unmapped (Signed)
1. Continue current medications    2. Your kidney function is stable    3. Congratulations on your weight loss    4. Let's plan to see you back in May

## 2018-06-29 NOTE — Unmapped (Signed)
HISTORY OF PRESENT ILLNESS:      [name] is a 68 year old man with stage G3a-b:A2 chronic kidney disease.  He returns today for ongoing followup.  Generally, he has felt quite well.  He has intermittent left shoulder pain.  He has intentionally lost several pounds since his last visit and he has been trying to maintain a  heathier diet.  He has not had recurrent abdominal pain.  There is no recent history of urinary gravel or flank pain. His leg edema has been well controlled. Home blood pressure average 130/80 mmHg.    MEDICATIONS:      Medication Sig   ??? albuterol (PROVENTIL HFA;VENTOLIN HFA) 90 mcg/actuation inhaler Inhale 2 puffs.   ??? allopurinol (ZYLOPRIM) 100 MG tablet Take 2 tablets (200 mg total) by mouth daily.   ??? amLODIPine (NORVASC) 2.5 MG tablet Take 1 tablet (2.5 mg total) by mouth every morning before breakfast.   ??? aspirin (ECOTRIN) 81 MG tablet    ??? atorvastatin (LIPITOR) 20 MG tablet TAKE 1 TABLET (20 MG) BY MOUTH DAILY   ??? baclofen (LIORESAL) 10 MG tablet Take 1 tablet (10 mg total) by mouth Three (3) times a day as needed for muscle spasms.   ??? calcium carbonate-vitamin D2 500 mg(1,250mg ) -200 unit tablet Take 1 tablet by mouth Two (2) times a day.   ??? carvedilol (COREG) 12.5 MG tablet Take 1 tablet (12.5 mg total) by mouth Two (2) times a day.   ??? cefuroxime (CEFTIN) 250 MG tablet    ??? cloNIDine HCl (CATAPRES) 0.1 MG tablet Take 1 tablet (0.1 mg total) by mouth 3 (three) times a day.   ??? cranberry 400 mg cap Take 400 mg by mouth daily.   ??? cyanocobalamin 1000 MCG tablet Take 1,000 mcg by mouth.   ??? diazepam (VALIUM) 5 MG tablet Take 5 mg by mouth nightly.    ??? docusate sodium (COLACE) 100 MG capsule Take 1 capsule (100 mg total) by mouth every twelve (12) hours.   ??? dolutegravir (TIVICAY) 50 mg Tab TABLET TAKE 1 TABLET BY MOUTH DAILY   ??? emtricitabine-tenofovir alafen (DESCOVY) 200-25 mg tablet TAKE 1 TABLET BY MOUTH DAILY   ??? ferrous sulfate 325 (65 FE) MG tablet Take 325 mg by mouth daily with breakfast.    ??? folic acid (FOLVITE) 1 MG tablet    ??? glimepiride (AMARYL) 1 MG tablet take 2 tablet by mouth once daily with BREAKFAST   ??? HYDROcodone-acetaminophen (NORCO) 5-325 mg per tablet Directions are take one tablet 30 minutes before physical therapy, then take one tablet at bedtime as needed for pain   ??? lidocaine (XYLOCAINE) 5 % ointment Apply 1 application topically daily as needed.   ??? losartan (COZAAR) 100 MG tablet Take 100 mg by mouth.   ??? nitroglycerin (NITROSTAT) 0.4 MG SL tablet Place 1 tablet (0.4 mg total) under the tongue every five (5) minutes as needed for chest pain.   ??? nystatin (MYCOSTATIN) 100,000 unit/mL suspension Take 5 mL by mouth.   ??? omega-3 fatty acids-fish oil 340-1,000 mg capsule Take 1 capsule by mouth.   ??? omeprazole (PRILOSEC) 20 MG capsule Take 1 capsule (20 mg total) by mouth daily.   ??? ondansetron (ZOFRAN) 4 MG tablet take 1 tablet by mouth every 8 hours if needed for nausea   ??? oxyCODONE-acetaminophen (PERCOCET) 5-325 mg per tablet Take 1 tablet by mouth.   ??? predniSONE (DELTASONE) 10 MG tablet Take one tablet daily by mouth for the next five  days.   ??? sertraline (ZOLOFT) 50 MG tablet take 1 tablet by mouth once daily   ??? spironolactone (ALDACTONE) 25 MG tablet Take 1 tablet (25 mg total) by mouth daily.   ??? tamsulosin (FLOMAX) 0.4 mg capsule Take 1 capsule (0.4 mg total) by mouth daily.   ??? tiZANidine (ZANAFLEX) 4 MG tablet Take 2 mg by mouth.   ??? torsemide (DEMADEX) 20 MG tablet Take 2 tablets (40 mg total) by mouth daily.       REVIEW OF SYSTEMS:  General:  No constitutional symptoms.  Cardiovascular:  No angina.  All other systems are reviewed and are negative except that noted in the history of present illness.    PHYSICAL EXAMINATION:    GENERAL:  He is in no acute distress.  VITAL SIGNS:  BP 140/80  - Pulse 76  - Temp 36.4 ??C (97.6 ??F) (Temporal)  - Wt 100.3 kg (221 lb 3.2 oz)  - BMI 30.83 kg/m??   Wt Readings from Last 3 Encounters:   06/28/18 100.3 kg (221 lb 3.2 oz)   06/08/18 (!) 103 kg (227 lb)   03/08/18 (!) 102.5 kg (226 lb)   HEENT:  Oropharynx - no lesions.  NECK:  Supple, no lymphadenopathy.  LUNGS:  Clear.  HEART:  Regular rate and rhythm.  A grade 2 early peaking crescendo-decrescendo murmur is heard best at the right sternal border.  An S4 gallop is present.  ABDOMEN:  Soft, nontender, bowel sounds are normal.  EXTREMITIES:  Trace ankle edema.  MUSCULOSKELETAL:  No synovitis.  NEUROLOGIC:  No lateralizing deficits.    LABORATORY STUDIES:      Chlamydia/Gonorrhoeae NAA, Urine    Collection Time: 06/08/18  9:58 AM   Result Value Ref Range    Chlamydia trachomatis, NAA Negative Negative    Gonorrhoeae NAA Negative Negative    CT/GC Specimen Type Urine     CT/GC Specimen Source Urine (Male)    Hemoglobin A1c    Collection Time: 06/08/18 10:18 AM   Result Value Ref Range    Hemoglobin A1C 6.8 (H) 4.8 - 5.6 %    Estimated Average Glucose 148 mg/dL   Quantiferon TB Gold Plus    Collection Time: 06/08/18 10:18 AM   Result Value Ref Range    Quantiferon TB Gold Negative Negative    Quantiferon NIL Value 0.43 IU/mL    Quantiferon Mitogen Minus Nil >10.00 IU/mL    Quantiferon Antigen Minus Nil -0.06 IU/mL    Quantiferon Antigen 2 minus NIL -0.06 IU/mL   RPR    Collection Time: 06/08/18 10:18 AM   Result Value Ref Range    RPR Nonreactive Nonreactive   Metabolic Panel, Basic    Collection Time: 06/08/18 10:18 AM   Result Value Ref Range    Sodium 142 135 - 145 mmol/L    Potassium 4.6 3.5 - 5.0 mmol/L    Chloride 102 98 - 107 mmol/L    CO2 30.0 22.0 - 30.0 mmol/L    Anion Gap 10 7 - 15 mmol/L    BUN 16 7 - 21 mg/dL    Creatinine 9.62 (H) 0.70 - 1.30 mg/dL    BUN/Creatinine Ratio 10     EGFR CKD-EPI Non-African American, Male 43 (L) >=60 mL/min/1.19m2    EGFR CKD-EPI African American, Male 49 (L) >=60 mL/min/1.64m2    Glucose 146 70 - 179 mg/dL    Calcium 95.2 (H) 8.5 - 10.2 mg/dL   HIV RNA, Quantitative, PCR    Collection  Time: 06/08/18 10:18 AM   Result Value Ref Range HIV RNA Quant Result Not Detected Not Detected    HIV RNA      HIV RNA Log(10)      HIV RNA Comment     Bilirubin, total    Collection Time: 06/08/18 10:18 AM   Result Value Ref Range    Total Bilirubin 1.1 0.0 - 1.2 mg/dL   AST    Collection Time: 06/08/18 10:18 AM   Result Value Ref Range    AST 39 19 - 55 U/L   ALT    Collection Time: 06/08/18 10:18 AM   Result Value Ref Range    ALT 46 <50 U/L   CBC w/ Differential    Collection Time: 06/08/18 10:18 AM   Result Value Ref Range    WBC 8.0 4.5 - 11.0 10*9/L    RBC 4.89 4.50 - 5.90 10*12/L    HGB 14.8 13.5 - 17.5 g/dL    HCT 98.1 19.1 - 47.8 %    MCV 93.7 80.0 - 100.0 fL    MCH 30.2 26.0 - 34.0 pg    MCHC 32.3 31.0 - 37.0 g/dL    RDW 29.5 62.1 - 30.8 %    MPV 8.2 7.0 - 10.0 fL    Platelet 195 150 - 440 10*9/L    Neutrophils % 63.5 %    Lymphocytes % 25.8 %    Monocytes % 5.5 %    Eosinophils % 2.0 %    Basophils % 1.6 %    Absolute Neutrophils 5.1 2.0 - 7.5 10*9/L    Absolute Lymphocytes 2.1 1.5 - 5.0 10*9/L    Absolute Monocytes 0.4 0.2 - 0.8 10*9/L    Absolute Eosinophils 0.2 0.0 - 0.4 10*9/L    Absolute Basophils 0.1 0.0 - 0.1 10*9/L    Large Unstained Cells 2 0 - 4 %    Hypochromasia Slight (A) Not Present   Albumin/creatinine urine ratio    Collection Time: 06/28/18  9:52 AM   Result Value Ref Range    Creat U 439.9 Undefined mg/dL    Albumin Quantitative, Urine      Albumin/Creatinine Ratio         IMPRESSION AND PLAN:    1.  Stage G3a-b:A2 chronic kidney disease.  He most likely has arterionephrosclerosis.  His EGFR remains relatively stable.  Although his blood pressure was somewhat high in this clinic visit, home blood pressures are acceptable.  He will continue to monitor his home blood pressures and will contact me if these remain elevated.    2.  Hypertension with mild flare today after recently running out of clonidine.  He will resume clonidine.  He will continue carvedilol, amlodipine, plus torsemide 40 mg daily and spironolactone.    3.  Human immunodeficiency virus disease.  His viral load remains undetectable.  He is doing well on HAART therapy.    4.  Lower urinary tract symptoms.  Continue tamsulosin.    5.  History of hypercalcemia with normal 125 dihydroxy vitamin D level and slightly elevated PTH.    6.  Nephrolithiasis.  Will plan to reimage his kidneys in 11/2018.    7.  Follow up plans.  Will see him back again in about 4 months.      Zetta Bills. Stefano Gaul, MD  Date of service 06/28/2018

## 2018-07-14 ENCOUNTER — Ambulatory Visit
Admit: 2018-07-14 | Discharge: 2018-07-14 | Payer: MEDICARE | Attending: Cardiovascular Disease | Primary: Cardiovascular Disease

## 2018-07-14 DIAGNOSIS — E8770 Fluid overload, unspecified: Secondary | ICD-10-CM

## 2018-07-14 DIAGNOSIS — R600 Localized edema: Secondary | ICD-10-CM

## 2018-07-14 DIAGNOSIS — I2510 Atherosclerotic heart disease of native coronary artery without angina pectoris: Secondary | ICD-10-CM

## 2018-07-14 DIAGNOSIS — R079 Chest pain, unspecified: Principal | ICD-10-CM

## 2018-07-14 DIAGNOSIS — N2889 Other specified disorders of kidney and ureter: Secondary | ICD-10-CM

## 2018-07-14 DIAGNOSIS — I151 Hypertension secondary to other renal disorders: Secondary | ICD-10-CM

## 2018-07-14 NOTE — Unmapped (Addendum)
DIVISION OF CARDIOLOGY  [facility] [place]        Date of Service: 07/14/2018    Follow-up Patient Clinic Note    PCP: Referring Provider:   Barbette Reichmann, MD  [address] [place] [address]  Phone: [phone]  Fax: [phone] Barbette Reichmann, MD  [address]  [place], [place] [address]  Phone: [phone]  Fax: [phone]       Assessment and Plan:     Coronary artery disease, angina present with recent NSTEMI (11/12/15):   Coronary angiography 11/13/15 showed diffuse disease including 90% large OM without any intervention. No significant ischemic signs or symptoms since that time. Does report sharp reproducible chest pain over the past week. Otherwise, some mild fatigue and dyspnea with significant exertion.   - Cont carvedilol (COREG) 12.5 MG tablet; Take 1 tablet (12.5 mg total) by mouth Two (2) times a day.    - Continue aspirin  - Atorvastatin 20mg   - Rare chest pain episodes over the past week atypical in nature and improved today. EKG overall reassuring with nonspecific T wave changes similar to prior. patient will notify me if chest pain worsens or occurs at rest as we may reconsider ischemic evaluation or increase in anti-anginal regimen    Hypervolemia in setting of CKD, liver dysfunction,  and HFpEF:   Patient appears closer to euvolemia than last time he was in clinic in 2018. Suspect etiology of his hypervolemia is multifactorial. Lungs are clear today with trace to 1+ BLE edema. Will need to be careful with more aggressive diuresis in setting of renal dysfunction.        -      Spironolactone 25MG  qd       -      Torsemide 40MG  qd, take extra dose in afternoon for weight gain greater than 3 pounds in 1 day or 5 pounds in 5 days.       -      Daily weights       -      Pro-BNP    HTN:   Will benefit from improved control especially in the setting of known CAD.  - Cont current regimen at this time, specifically start taking his clonidine as instructed as opposed to taking 0.3 mg nightly.  -Requested patient bring his home blood pressure cuff into clinic next visit to be sure it correlates    CKD (chronic kidney disease) stage 3, GFR 30-59 ml/min  - Recent Cr 1.65, stable with prior  - Careful monitoring given volume status and increased diuresis    Human immunodeficiency virus (HIV) disease  - Cont to follow with ID    Aortic Sclerosis  - Systolic murmur c/w prior. Consider repeat TTE if symptoms worsen.     Samuel [name] will return in 6 month or sooner as needed.    The patient was seen and evaluated by Dr. Willa Rough, who agrees with the findings, assessment, and recommendations.        Subjective:     Chief Complaint:  Swelling    History of Present Illness:     Samuel [name] is a 68 y.o. male with a history of CAD with recent NSTEMI 10/2015, HIV (diagnosed 12/1999), CKD, Chronic HCV, gout, nephrotic syndrome, and HTN who is referred for evaluation of CAD and chest pain.      Patient was admitted on 11/12/2015 to the [facility] with typical substernal  chest pain described as pressure brought on by exertion, with shortness breath and diaphoresis with radiation to left arm. Labs were notable for elevated troponin, peak at 0.585 downtrending 0.433, and EKG showing T-wave inversions in the lateral leads. Echocardiography showed normal LVEF, diastolic dysfunction and moderate to severe LVH. Patient received LHC on 6/14 that showed coronary artery disease including: 40% mid LAD at the D1 bifurcation area, 30-40% mid/distal LAD, 95% stenosis in the large branching OM, 80-90% stenoses in both distal branches of the large OM, diffuse mild disease in the RCA. Decision was made to medically manage patient is discharged on DAPT with aspirin and clopidogrel in addition to Imdur 30 daily for chest pain. Patient was last seen almost 2 years ago and has canceled / rescheduled multiple follow up appointments since that time.    He continues to follow with ID for his HIV. Home regimen of dolutegravir 50mg  and emtricitabine-tenofovir 200-25mg . He was walso recent recently by renal with improved weight and stable renal function.     Today, he reports doing well overall.  He denies any significant changes in his health and feels like he is doing better than the last time I saw him 2 years ago.  He is try to stay active but admits to a poor diet.  Currently trying to go to the gym but typically talks for most of the time he is there.  Reports occasional sharp chest pain that occurs at random over the past week.  However, denies any exertional chest pain does have some mild fatigue and dyspnea when pushing himself.  Feels that his lower extremity edema has improved since using compression stockings and taking his diuretic.    He checks his blood pressure at home but feels like his cuff does not correlate with the measurements are obtained and the clinic.  Blood pressure typically running 130 systolic.  He is currently taking his clonidine at night -set of taking 0.1 mg 3 times daily currently taking 0.3 mg nightly.    Cardiovascular History & Procedures:  Cardiovascular Problems:  ?? CAD with NSTEMI 11/12/2015  ?? HTN    Risk Factors:  advanced age (older than 26 for men, 35 for women), hypertension and male gender    Cath / PCI:  ?? Coronary Angiography 11/13/2015:   1. Coronary artery disease including:  a) 40% mid LAD at the D1 bifurcation area  b) 30-40% mid/distal LAD  c) 95% stenosis in the large branching OM  d) 80-90% stenoses in both distal branches of the large OM  e) Diffuse mild disease in the RCA  ??   2. Elevated left heart filling pressure (LVEDP = 20 mmHg)   3. No intervention was made.    CV Surgery:  ??  None    EP Procedures and Devices:  ?? None    Non-Invasive Evaluation(s):  ?? TTE 11/13/2015  ?? Left ventricular hypertrophy - moderate  ?? Normal left ventricular systolic function, ejection fraction > 55%  ?? Dilated left atrium - mild  ?? Aortic sclerosis  ?? Normal right ventricular systolic function    Medical History:  Past Medical History:   Diagnosis Date   ??? CKD (chronic kidney disease) stage 3, GFR 30-59 ml/min (CMS-HCC) 01/08/2015   ??? Coronary artery disease 10/2015    Multivessel disease, plan medical management after cath 6/17   ??? GERD (gastroesophageal reflux disease)    ??? Gout    ??? HIV (human immunodeficiency virus infection) (CMS-HCC)  Undectable viral load 5/17   ??? Hypertension    ??? Nephrolithiasis    ??? Nephrotic syndrome 03/05/2014   ??? Renal mass     Followed by neprhology, MRI 8/16 improved, felt to be benign       Surgical History:  Past Surgical History:   Procedure Laterality Date   ??? BACK SURGERY      2012   ??? PR CATH PLACE/CORON ANGIO, IMG SUPER/INTERP,W LEFT HEART VENTRICULOGRAPHY N/A 11/13/2015    Procedure: Left Heart Catheterization;  Surgeon: Orpha Bur, MD;  Location: [facility] CATH;  Service: Cardiology   ??? PR REMOVAL OF ANAL FISSURE N/A 06/29/2014    Procedure: FISSURECTOMY, INCLUDING SPHINCTEROTOMY, WHEN PERFORMED;  Surgeon: Romero Belling, MD;  [facility]: [facility];  Service: Gastrointestinal   ??? PR SURG DIAGNOSTIC EXAM, ANORECTAL N/A 06/29/2014    Procedure: ANORECTAL EXAM, SURGICAL, REQUIRING ANESTHESIA (GENERAL, SPINAL, OR EPIDURAL), DIAGNOSTIC;  Surgeon: Romero Belling, MD;  Location: [facility] OR [facility];  Service: Gastrointestinal       Social History:   reports that he quit smoking about 26 years ago. He has a 10.00 pack-year smoking history. He has never used smokeless tobacco. He reports that he does not drink alcohol or use drugs.      Family History:  family history includes Cancer in his father and mother; Diabetes in his brother; Heart disease (age of onset: 43) in his sister; Hypertension in his mother and sister; Kidney disease in his father; Thyroid disease in his mother.      Review of Systems:   Except as noted in the HPI, the remainder of 10 systems reviewed is negative.     Allergies:  Allergies   Allergen Reactions   ??? Colchicine Analogues Diarrhea     Have diarrhea when taken for long periods of time   ??? Tramadol Other (See Comments)     unknown tolerates morphine       Medications:   Prior to Admission medications    Medication Dose, Route, Frequency   allopurinol (ZYLOPRIM) 100 MG tablet 200 mg, Oral, Daily (standard)   amLODIPine (NORVASC) 2.5 MG tablet 2.5 mg, Oral, Daily   aspirin (ECOTRIN) 81 MG tablet No dose, route, or frequency recorded.   atorvastatin (LIPITOR) 20 MG tablet TAKE 1 TABLET (20 MG) BY MOUTH DAILY   baclofen (LIORESAL) 10 MG tablet 10 mg, Oral, 3 times a day PRN   calcium carbonate-vitamin D2 500 mg(1,250mg ) -200 unit tablet 1 tablet, Oral, 2 times a day (standard)   carvedilol (COREG) 12.5 MG tablet 12.5 mg, Oral, 2 times a day (standard)   cloNIDine HCl (CATAPRES) 0.1 MG tablet 0.1 mg, Oral, 3 times daily (RT)   cranberry 400 mg cap 400 mg, Oral, Daily (standard)   cyanocobalamin 1000 MCG tablet 1,000 mcg, Oral   diazepam (VALIUM) 5 MG tablet 5 mg, Oral, Nightly   docusate sodium (COLACE) 100 MG capsule 100 mg, Oral, Every 12 hours   dolutegravir (TIVICAY) 50 mg Tab TABLET 50 mg, Oral   emtricitabine-tenofovir alafen (DESCOVY) 200-25 mg tablet 1 tablet, Oral   ferrous sulfate 325 (65 FE) MG tablet 325 mg, Oral, Daily   folic acid (FOLVITE) 1 MG tablet No dose, route, or frequency recorded.   glimepiride (AMARYL) 1 MG tablet take 2 tablet by mouth once daily with BREAKFAST   HYDROcodone-acetaminophen (NORCO) 5-325 mg per tablet Directions are take one tablet 30 minutes before physical therapy, then take one tablet at bedtime as needed for pain  losartan (COZAAR) 100 MG tablet 100 mg, Oral   omega-3 fatty acids-fish oil 340-1,000 mg capsule 1 capsule, Oral   omeprazole (PRILOSEC) 20 MG capsule 20 mg, Oral, Daily (standard)   oxyCODONE-acetaminophen (PERCOCET) 5-325 mg per tablet 1 tablet, Oral   sertraline (ZOLOFT) 50 MG tablet take 1 tablet by mouth once daily   spironolactone (ALDACTONE) 25 MG tablet 25 mg, Oral, Daily (standard)   tiZANidine (ZANAFLEX) 4 MG tablet 2 mg, Oral   torsemide (DEMADEX) 20 MG tablet 40 mg, Oral, Daily (standard)   albuterol (PROVENTIL HFA;VENTOLIN HFA) 90 mcg/actuation inhaler 2 puffs, Inhalation   cefuroxime (CEFTIN) 250 MG tablet No dose, route, or frequency recorded.   lidocaine (XYLOCAINE) 5 % ointment 1 application, Topical, Daily PRN   naproxen (NAPROSYN) 500 MG tablet TK 1 T PO BID WF   nitroglycerin (NITROSTAT) 0.4 MG SL tablet 0.4 mg, Sublingual, Every 5 min PRN  Patient not taking: Reported on 07/14/2018   nystatin (MYCOSTATIN) 100,000 unit/mL suspension 5 mL, Oral   ondansetron (ZOFRAN) 4 MG tablet take 1 tablet by mouth every 8 hours if needed for nausea   predniSONE (DELTASONE) 10 MG tablet Take one tablet daily by mouth for the next five days.   tamsulosin (FLOMAX) 0.4 mg capsule 0.4 mg, Oral, Daily (standard)  Patient not taking: Reported on 07/14/2018         Objective:     Vitals  BP 140/90 (BP Site: L Arm, BP Position: Sitting, BP Cuff Size: Medium)  - Pulse 61  - Resp 16  - Ht 180.4 cm (5' 11.02)  - Wt (!) 102.9 kg (226 lb 14.4 oz)  - SpO2 99%  - BMI 31.62 kg/m??      Wt Readings from Last 3 Encounters:   07/14/18 (!) 102.9 kg (226 lb 14.4 oz)   06/28/18 100.3 kg (221 lb 3.2 oz)   06/08/18 (!) 103 kg (227 lb)         Physical Exam  General:  Pleasant male sitting in chair in nad.   Neck: Supple   Resp:   CTAB bilaterally with normal WOB.   Cardio:  Regular. 2/6 SEM heard best at RUSB.  Stable with prior   Abdomen:   Soft, slightly distended   Extremities: Warm well-perfused bilaterally. Trace to 1+ edema bilaterally.   MSK: No joint swelling or erythema. No gross deformities.   Skin: No rashes   Neuro: CN II-XII grossly intact. Strength grossly intact.    Psych: Alert and oriented x3. Appropriate mood.        Most Recent Labs   Lab Results   Component Value Date    NA 142 06/08/2018    K 4.6 06/08/2018    CL 102 06/08/2018    CO2 30.0 06/08/2018     Lab Results   Component Value Date    BUN 16 06/08/2018    BUN 15 03/08/2018    BUN 19 08/15/2014    BUN 11 08/01/2014     Lab Results   Component Value Date    Creatinine 1.65 (H) 06/08/2018    Creatinine 1.75 (H) 03/08/2018    Creatinine 1.68 (H) 08/15/2014    Creatinine 1.49 (H) 08/01/2014     Lab Results   Component Value Date    PRO-BNP 158.0 03/06/2016    PRO-BNP 50.8 01/03/2016     Lab Results   Component Value Date    Cholesterol 111 09/16/2016    Cholesterol, Total 140 01/03/2014  Triglycerides 198 (H) 09/16/2016    Triglycerides 80 01/03/2014    HDL 32 (L) 09/16/2016    HDL 53 01/03/2014    Non-HDL Cholesterol 79 09/16/2016    LDL Calculated 39 (L) 09/16/2016    LDL Cholesterol, Calculated 71 01/03/2014

## 2018-07-15 NOTE — Unmapped (Signed)
I saw and evaluated the patient, participating in the key portions of the service and reviewing pertinent diagnostic studies and records. I reviewed the resident’s note and agree with the findings and plan.  - Timothy Townsel H Kimala Horne, MD, FACC

## 2018-07-18 MED FILL — TIVICAY 50 MG TABLET: 30 days supply | Qty: 30 | Fill #6 | Status: AC

## 2018-07-18 MED FILL — DESCOVY 200 MG-25 MG TABLET: 30 days supply | Qty: 30 | Fill #6 | Status: AC

## 2018-07-18 MED FILL — DESCOVY 200 MG-25 MG TABLET: ORAL | 30 days supply | Qty: 30 | Fill #6

## 2018-07-18 MED FILL — TIVICAY 50 MG TABLET: ORAL | 30 days supply | Qty: 30 | Fill #6

## 2018-07-18 NOTE — Unmapped (Signed)
[facility] Refill and Clinical Coordination Note  Medication(s): Descovy and Samuel [name], DOB: May 26, 1951  Phone: [phone] (home) , Alternate phone contact: N/A  Shipping address: [address]  [place] [address]  Phone or address changes today?: No  All above HIPAA information verified.  Insurance changes? No    Completed refill and clinical call assessment today to schedule patient's medication shipment from the [facility] [phone]).      MEDICATION RECONCILIATION    Confirmed the medication and dosage are correct and have not changed: Yes, regimen is correct and unchanged.    Were there any changes to your medication(s) in the past month:  No, there are no changes reported at this time.    ADHERENCE    Is this medicine transplant or covered by Medicare Part B? No.    Not Applicable    Did you miss any doses in the past 4 weeks? No missed doses reported.  Adherence counseling provided? Not needed     Has enough until 07/20/18     SIDE EFFECT MANAGEMENT    Are you tolerating your medication?:  Samuel [name] reports tolerating the medication.  Side effect management discussed: None      Therapy is appropriate and should be continued.    Evidence of clinical benefit: Do you feel that that the medication is helping? Yes      FINANCIAL/SHIPPING    Delivery Scheduled: Yes, Expected medication delivery date: 07/19/18     Medication will be delivered via Next Day Courier to the home address in [facility].    Additional medications refilled: No additional medications/refills needed at this time.    The patient will receive a drug information handout for each medication shipped and additional FDA Medication Guides as required.      Samuel [name] did not have any additional questions at this time.    Delivery address confirmed in Epic.     We will follow up with patient monthly for standard refill processing and delivery.      Thank you,  Rollen Sox   [facility] Specialty Pharmacist

## 2018-08-05 DIAGNOSIS — N183 Chronic kidney disease, stage 3 (moderate): Principal | ICD-10-CM

## 2018-08-06 DIAGNOSIS — I2510 Atherosclerotic heart disease of native coronary artery without angina pectoris: Principal | ICD-10-CM

## 2018-08-08 MED ORDER — CARVEDILOL 12.5 MG TABLET
ORAL_TABLET | 3 refills | 0 days | Status: CP
Start: 2018-08-08 — End: 2018-10-18

## 2018-08-16 NOTE — Unmapped (Signed)
[facility] Refill Coordination Note    Specialty Medication(s) to be Shipped:   Infectious Disease: Descovy and Tivicay    Other medication(s) to be shipped: atorvastatin pending new rx     [name], DOB: 1951-05-10  Phone: [phone] (home)       All above HIPAA information was verified with patient.     Completed refill call assessment today to schedule patient's medication shipment from the [facility] [phone]).       Specialty medication(s) and dose(s) confirmed: Regimen is correct and unchanged.   Changes to medications: Pat reports no changes reported at this time.  Changes to insurance: No  Questions for the pharmacist: No    Confirmed patient received Welcome Packet with first shipment. The patient will receive a drug information handout for each medication shipped and additional FDA Medication Guides as required.       DISEASE/MEDICATION-SPECIFIC INFORMATION        N/A    SPECIALTY MEDICATION ADHERENCE     Medication Adherence    Specialty Medication:  Tivicay  Patient is on additional specialty medications:  Yes  Additional Specialty Medications:  Descovy  Patient Reported Additional Medication X Missed Doses in the Last Month:  0  Patient is on more than two specialty medications:  No                Tivicay 50 mg: 3 days of medicine on hand   Descovy 200-25 mg: 3 days of medicine on hand       SHIPPING     Shipping address confirmed in Epic.     Delivery Scheduled: Yes, Expected medication delivery date: 08/18/18.     Medication will be delivered via Next Day Courier to the prescription address in Epic WAM.    Arnold Long   [facility] Specialty Pharmacist

## 2018-08-16 NOTE — Unmapped (Signed)
[facility] Refill Coordination Note  Medication: TIVICAY  DESCOVY    Unable to reach patient to schedule shipment for medication being filled at [facility]. Left voicemail on phone.  As this is the 2ND unsuccessful attempt to reach the patient, no additional phone call attempts will be made at this time.      Phone numbers attempted: [phone]  [phone]  Last scheduled delivery: SHIPPED 2/17    Please call the [facility] at [phone] (option [phone]) should you have any further questions.      Thanks,  [facility] Specialty Team

## 2018-08-17 MED ORDER — ATORVASTATIN 20 MG TABLET
ORAL_TABLET | 3 refills | 0 days | Status: CP
Start: 2018-08-17 — End: 2019-08-17
  Filled 2018-10-11: qty 90, 90d supply, fill #0

## 2018-08-17 MED FILL — TIVICAY 50 MG TABLET: 30 days supply | Qty: 30 | Fill #7 | Status: AC

## 2018-08-17 MED FILL — DESCOVY 200 MG-25 MG TABLET: 30 days supply | Qty: 30 | Fill #7 | Status: AC

## 2018-08-17 MED FILL — DESCOVY 200 MG-25 MG TABLET: ORAL | 30 days supply | Qty: 30 | Fill #7

## 2018-08-17 MED FILL — TIVICAY 50 MG TABLET: ORAL | 30 days supply | Qty: 30 | Fill #7

## 2018-09-07 ENCOUNTER — Ambulatory Visit
Admit: 2018-09-07 | Discharge: 2018-09-08 | Payer: MEDICARE | Attending: Infectious Disease | Primary: Infectious Disease

## 2018-09-07 MED ORDER — EMTRICITABINE 200 MG-TENOFOVIR ALAFENAMIDE FUMARATE 25 MG TABLET
ORAL_TABLET | ORAL | 11 refills | 0 days | Status: CP
Start: 2018-09-07 — End: 2018-11-09

## 2018-09-07 MED ORDER — DOLUTEGRAVIR 50 MG TABLET
ORAL | 11 refills | 0 days | Status: CP
Start: 2018-09-07 — End: 2018-11-09
  Filled 2018-09-15: qty 30, 30d supply, fill #0

## 2018-09-07 NOTE — Unmapped (Signed)
Assessment/Plan:            .   HIV:  Viral load detectable, but <40 on current ART (Descovy/dolutegravir), which he tolerates well. I reviewed chart in anticipation of today's visit. We discussed labs.  Will continue current regimen. Cirrhosis and renal insufficiency continue to be his biggest current medical problems. I have asked Jonah to arrange for his labs to be drawn.     Obesity: Nutrition counseling requested.    S/p L shoulder surgery:  Followed by his PCP.    Cirrhosis: Followed by GI. Will need liver cancer screening soon.    SVR following Zepatier for Chronic HCV Infection (genotype 1a): Stable.    CKD: Stage 3 -- Followed by Dr Stefano Gaul.    HFpEF: Followed by Cardiology.    HTN: Stable.    Sexual Health:     Mental Health: Stable.    Health maintenance:   Pap N/A  RPR False + (1:2 with neg FTA 11/12/2015)  GC/CT - neg 08/26/2016  Hb A1C 11/12/2015: 5.7   Cholesterol 11/12/2015: total cholesterol 148, HDL 37, LDL (calc) 80, triglycerides 155  TB screening neg, 06/10/2016  Lung cancer screening N/A (quit 1993 after 1 ppd x 10 yrs)  Colonoscopy - Requested for screening    Pneumococcal and second Shingrix vaccine requested today.        Prevention, adherence and health education:   .  .  .  .  .  .  .    Educational and counseling services took 25  minutes of today's visit.    Follow-up:  Return to clinic in 1 month or sooner if needed.   Subjective:    Samuel [name] is a 68 y.o. male who presents to the Infectious Disease clinic for return HIV visit.     Chief Complaint: No chief complaint on file.      HPI:   HIV diagnosed in 12/1999. In 07/13/00, CD4 count was 547 (25%) with a viral load of about 5293. For a number of years, his viral load and CD4 count were generally reasonably well maintained in the absence of antiretroviral therapy, and he never had any HIV-related symptoms. However, during 2008-2009, his viral load dramatically increased and his CD4 count fell. Viral load reached a height of 309,000 on 08/31/2007 and CD4 count fell substantially to 244 on 09/29/2007. He had always been reluctant to take antiviral medications and in fact consistently and repeatedly refused hepatitis C treatment as well as HIV treatment in the past. However, because of the deterioration of his CD4 count and viral load, I prescribed Atripla on 10/12/07. Although he agreed to take it he did not done so until more than a year later, despite assurances during each visit that he would begin ART immediately. Once he finally started ART, however, he tolerated it quite well. On 07/24/2015 he was switched from Atripla to Descovy/dolutegravir b/o CKD.     TODAY'S VISIT WAS CONDUCTED BY PHONE B/O THE PANDEMIC:    His HIV has been well controlled, but he has multiple severe comorbidities, including HCV (SVR after 12 wks of Zepatier 04/20/2016-07/13/2016; HCV RNA BDL 10/28/2016), resultant cirrhosis (Metavir F4), CKD Stage 3, NSTEMI 10/2015. He is followed closely by Nephrology, GI, and Cardiology. His course has been eventful during the past few months. He was seen in 03/2018 by Dr Stefano Gaul (Renal) and felt to be doing well. 11/2017 Abd US revealed no liver lesions.    Underwent L rotator cuff repair in 07/2017.  Has generally been doing well, although still has trouble lifting arm - a problem, since he's L handed. No hospitalizations since I last saw him.    Has wart under R leg and was referred to Derm by PCP, but visit never occurred b/o pandemic. Also screening colonoscopy not performed for the same reason.     Has missed no ART doses since I last saw him. Staying isolated in house with wife. Feels really good.    ROS otherwise unremarkable.         Past Medical History:   Diagnosis Date   ??? CKD (chronic kidney disease) stage 3, GFR 30-59 ml/min (CMS-HCC) 01/08/2015   ??? Coronary artery disease 10/2015    Multivessel disease, plan medical management after cath 6/17   ??? GERD (gastroesophageal reflux disease)    ??? Gout    ??? HIV (human immunodeficiency virus infection) (CMS-HCC)     Undectable viral load 5/17   ??? Hypertension    ??? Nephrolithiasis    ??? Nephrotic syndrome 03/05/2014   ??? Renal mass     Followed by neprhology, MRI 8/16 improved, felt to be benign       Medications:  Current Outpatient Medications   Medication Sig Dispense Refill   ??? albuterol (PROVENTIL HFA;VENTOLIN HFA) 90 mcg/actuation inhaler Inhale 2 puffs.     ??? allopurinol (ZYLOPRIM) 100 MG tablet Take 2 tablets (200 mg total) by mouth daily. 180 tablet 3   ??? amLODIPine (NORVASC) 2.5 MG tablet Take 1 tablet (2.5 mg total) by mouth every morning before breakfast. 90 tablet 3   ??? aspirin (ECOTRIN) 81 MG tablet   0   ??? atorvastatin (LIPITOR) 20 MG tablet TAKE 1 TABLET (20 MG) BY MOUTH DAILY 90 tablet 3   ??? baclofen (LIORESAL) 10 MG tablet Take 1 tablet (10 mg total) by mouth Three (3) times a day as needed for muscle spasms. 30 tablet 2   ??? calcium carbonate-vitamin D2 500 mg(1,250mg ) -200 unit tablet Take 1 tablet by mouth Two (2) times a day.     ??? carvediloL (COREG) 12.5 MG tablet TAKE 1 TABLET BY MOUTH TWICE DAILY 180 tablet 3   ??? cefuroxime (CEFTIN) 250 MG tablet   0   ??? cloNIDine HCl (CATAPRES) 0.1 MG tablet Take 1 tablet (0.1 mg total) by mouth 3 (three) times a day. 270 tablet 3   ??? cranberry 400 mg cap Take 400 mg by mouth daily.     ??? cyanocobalamin 1000 MCG tablet Take 1,000 mcg by mouth.     ??? diazepam (VALIUM) 5 MG tablet Take 5 mg by mouth nightly.      ??? docusate sodium (COLACE) 100 MG capsule Take 1 capsule (100 mg total) by mouth every twelve (12) hours. 60 capsule 0   ??? dolutegravir (TIVICAY) 50 mg Tab TABLET TAKE 1 TABLET BY MOUTH DAILY 30 each 11   ??? emtricitabine-tenofovir alafen (DESCOVY) 200-25 mg tablet TAKE 1 TABLET BY MOUTH DAILY 30 tablet 11   ??? ferrous sulfate 325 (65 FE) MG tablet Take 325 mg by mouth daily with breakfast.      ??? folic acid (FOLVITE) 1 MG tablet   0   ??? glimepiride (AMARYL) 1 MG tablet take 2 tablet by mouth once daily with BREAKFAST  0   ??? HYDROcodone-acetaminophen (NORCO) 5-325 mg per tablet Directions are take one tablet 30 minutes before physical therapy, then take one tablet at bedtime as needed for pain     ??? lidocaine (  XYLOCAINE) 5 % ointment Apply 1 application topically daily as needed.     ??? losartan (COZAAR) 100 MG tablet Take 100 mg by mouth.     ??? nitroglycerin (NITROSTAT) 0.4 MG SL tablet Place 1 tablet (0.4 mg total) under the tongue every five (5) minutes as needed for chest pain. (Patient not taking: Reported on 07/14/2018) 25 tablet 4   ??? nystatin (MYCOSTATIN) 100,000 unit/mL suspension Take 5 mL by mouth.     ??? omega-3 fatty acids-fish oil 340-1,000 mg capsule Take 1 capsule by mouth.     ??? omeprazole (PRILOSEC) 20 MG capsule Take 1 capsule (20 mg total) by mouth daily. 90 capsule 3   ??? ondansetron (ZOFRAN) 4 MG tablet take 1 tablet by mouth every 8 hours if needed for nausea     ??? oxyCODONE-acetaminophen (PERCOCET) 5-325 mg per tablet Take 1 tablet by mouth.     ??? predniSONE (DELTASONE) 10 MG tablet Take one tablet daily by mouth for the next five days.     ??? sertraline (ZOLOFT) 50 MG tablet take 1 tablet by mouth once daily     ??? spironolactone (ALDACTONE) 25 MG tablet Take 1 tablet (25 mg total) by mouth daily. 30 tablet 0   ??? torsemide (DEMADEX) 20 MG tablet Take 2 tablets (40 mg total) by mouth daily. 90 tablet 3     No current facility-administered medications for this visit.        Allergies: Colchicine analogues and Tramadol    Social History:  As per MEDICAL RECORD NUMBERNo cigs or alcohol at all. No current IDU (quit many years ago). Lives with wife.     Review of Systems:  A 12 point review of systems was negative except for pertinent items noted in the HPI.    Objective:       There were no vitals taken for this visit.    GEN:  looks well, no apparent distress  EYES: sclerae anicteric and non injected  ZOX:WRUEAVWU on top and bottom. No lesions.  LYMPH:no cervical or supraclavicular LAD  CV:no peripheral edema, normal pulses. Nl s1, s2, without s3, s4 +2/6 sys m diffusely over precordium (except no radiation to neck or axilla)  PULM:CTAB ant/post, normal work of breathing  ABD: Protuberant - but not taut, nontender, no masses felt.  JW:JXBJYNWG  RECTAL:deferred  SKIN:no petechiae, ecchymoses or obvious rashes on clothed exam  MSK: L shoulder abduction limited by pain; 1+ pitting edema bilat  NEURO:CN II-XII intact  PSYCH:attentive, appropriate affect, good eye contact, fluent speech    Recent Labs:    Lab Results   Component Value Date    RPR Nonreactive 06/08/2018    RPR Reactive (A) 11/12/2015    GCNAA Negative 06/08/2018    CTNAA Negative 06/08/2018    A1C 6.8 (H) 06/08/2018    A1C 5.7 11/12/2015    CHOL 111 09/16/2016    CHOL 148 11/12/2015    CHOL 120 07/24/2015    LDL 39 (L) 09/16/2016    LDL 80 11/12/2015    LDL 46 (L) 07/24/2015    HDL 32 (L) 09/16/2016    HDL 37 (L) 11/12/2015    HDL 52 07/24/2015    TRIG 956 (H) 09/16/2016    TRIG 155 (H) 11/12/2015    TRIG 111 07/24/2015    CHOLHDLRATIO 3.5 09/16/2016    CHOLHDLRATIO 4.0 11/12/2015    CHOLHDLRATIO 2.3 07/24/2015    QFTTBGOLD Negative 06/08/2018         Absolute  CD4 Count   Date Value Ref Range Status   09/01/2017 417 (L) 510-2,320 /uL Final   04/02/2017 290 (L) 510-2,320 /uL Final   09/16/2016 547 510 - 2,320 /uL Final   03/11/2016 551 510 - 2,320 /uL Final   08/01/2014 514 510 - 2,320 /uL Final   01/03/2014 430 (L) 510 - 2,320 /uL Final   11/23/2012 549 510 - 2,320 /uL Final   07/20/2012 470 (L) 510 - 2,320 CELLS/UL Final     CD4% (T Helper)   Date Value Ref Range Status   09/01/2017 27 (L) 34 - 58 % Final   04/02/2017 29 (L) 34 - 58 % Final   09/16/2016 28 (L) 34 - 58 % Final   03/11/2016 29 (L) 34 - 58 % Final   08/01/2014 35 34 - 58 % Final   01/03/2014 34 34 - 58 % Final   11/23/2012 31 (L) 34 - 58 % Final   07/20/2012 29 (L) 34 - 58 % OF LYMPHS Final     HIV RNA Quant Result   Date Value Ref Range Status   06/08/2018 Not Detected Not Detected Final   12/01/2017 Detected (A) Not Detected Final   09/01/2017 Detected (A) Not Detected Final   04/02/2017 Detected (A) Not Detected Final   08/15/2014 Not Detected  Final   01/03/2014 Not Detected  Final   11/23/2012 Not Detected  Final   07/20/2012 Not Detected  Final     HIV RNA   Date Value Ref Range Status   12/01/2017 <40 (H) <0 copies/mL Final   09/01/2017 <40 (H) <0 copies/mL Final   04/02/2017 <40 (H) <0 copies/mL Final   09/16/2016 <40 (H) <0 copies/mL Final     HIV RNA Log(10)   Date Value Ref Range Status   12/01/2017  <0.00 log copies/mL Final     Comment:     <1.6 log   09/01/2017  <0.00 log copies/mL Final     Comment:     <1.6 log   04/02/2017  <0.00 log copies/mL Final     Comment:     <1.6 log   09/16/2016  <0.00 log copies/mL Final     Comment:     <1.6 log         Absolute CD4 Count   Date Value Ref Range Status   09/01/2017 417 (L) 510-2,320 /uL Final   04/02/2017 290 (L) 510-2,320 /uL Final   09/16/2016 547 510 - 2,320 /uL Final   03/11/2016 551 510 - 2,320 /uL Final   08/01/2014 514 510 - 2,320 /uL Final   01/03/2014 430 (L) 510 - 2,320 /uL Final   11/23/2012 549 510 - 2,320 /uL Final   07/20/2012 470 (L) 510 - 2,320 CELLS/UL Final     HIV RNA   Date Value Ref Range Status   12/01/2017 <40 (H) <0 copies/mL Final   09/01/2017 <40 (H) <0 copies/mL Final   04/02/2017 <40 (H) <0 copies/mL Final   09/16/2016 <40 (H) <0 copies/mL Final      Lab Results   Component Value Date    WBC 8.0 06/08/2018    WBC 8.4 08/01/2014    HGB 14.8 06/08/2018    HGB 11.1 (L) 08/01/2014    Platelet 195 06/08/2018    Platelet 181 08/01/2014    Creatinine 1.65 (H) 06/08/2018    Creatinine 1.68 (H) 08/15/2014    AST 39 06/08/2018    AST 50 08/01/2014    ALT 46  06/08/2018    ALT 60 08/01/2014    Triglycerides 198 (H) 09/16/2016    Triglycerides 80 01/03/2014    HDL 32 (L) 09/16/2016    HDL 53 01/03/2014    LDL Calculated 39 (L) 09/16/2016    LDL Cholesterol, Calculated 71 01/03/2014       Immunization History   Administered Date(s) Administered   ??? Hepatitis B, Adult 04/06/2016, 05/21/2016, 08/24/2016   ??? INFLUENZA TIV (TRI) PF (IM) 02/08/2008, 03/26/2010, 03/25/2011   ??? Influenza Vaccine Quad (IIV4 PF) 61mo+ injectable 07/24/2015, 02/05/2016, 03/08/2018   ??? Influenza Virus Vaccine, unspecified formulation 06/15/2014, 07/24/2015, 02/05/2016, 03/15/2016, 03/31/2017   ??? PNEUMOCOCCAL POLYSACCHARIDE 23 07/13/2000, 08/19/2005, 12/01/2017   ??? PPD Test 12/29/2006, 07/11/2008, 03/26/2010, 03/25/2011   ??? Pneumococcal Conjugate 13-Valent 04/13/2012   ??? SHINGRIX-ZOSTER VACCINE (HZV), RECOMBINANT,SUB-UNIT,ADJUVANTED IM 09/16/2016, 12/01/2017   ??? TdaP 08/31/2007   ??? Tuberculin Skin Test;unspecified Formulation 12/29/2006, 07/11/2008, 03/26/2010, 03/25/2011

## 2018-09-08 NOTE — Unmapped (Signed)
Spoke with patient about where he can go to get his labs drawn and he would like to go to [facility]. Verified that they can see orders put in through [facility].  Changed orders that provider put in to result at [facility]

## 2018-09-09 NOTE — Unmapped (Signed)
[facility] Refill Coordination Note    Specialty Medication(s) to be Shipped:   Infectious Disease: Descovy and Tivicay    Other medication(s) to be shipped: none     [employer], DOB: Apr 18, 1951  Phone: [phone] (home)       All above HIPAA information was verified with patient.     Completed refill call assessment today to schedule patient's medication shipment from the [facility] [phone]).       Specialty medication(s) and dose(s) confirmed: Regimen is correct and unchanged.   Changes to medications: Reichen reports no changes reported at this time.  Changes to insurance: No  Questions for the pharmacist: No    Confirmed patient received Welcome Packet with first shipment. The patient will receive a drug information handout for each medication shipped and additional FDA Medication Guides as required.       DISEASE/MEDICATION-SPECIFIC INFORMATION        N/A    SPECIALTY MEDICATION ADHERENCE     Medication Adherence    Patient reported X missed doses in the last month:  0  Specialty Medication:  Descovy   Patient is on additional specialty medications:  Yes  Additional Specialty Medications:  Tivicay  Patient Reported Additional Medication X Missed Doses in the Last Month:  0  Patient is on more than two specialty medications:  No          Refill Coordination    Has the Patients' Contact Information Changed:  No  Is the Shipping Address Different:  No           Descovy 200-25 mg: 10 days of medicine on hand   tivicay 50 mg: 10 days of medicine on hand          SHIPPING     Shipping address confirmed in Epic.     Delivery Scheduled: Yes, Expected medication delivery date: 09/16/18.     Medication will be delivered via Next Day Courier to the prescription address in Epic WAM.    [name]   [facility] Specialty Technician

## 2018-09-15 MED FILL — TIVICAY 50 MG TABLET: 30 days supply | Qty: 30 | Fill #0 | Status: AC

## 2018-09-15 MED FILL — DESCOVY 200 MG-25 MG TABLET: ORAL | 30 days supply | Qty: 30 | Fill #0

## 2018-09-15 MED FILL — DESCOVY 200 MG-25 MG TABLET: 30 days supply | Qty: 30 | Fill #0 | Status: AC

## 2018-09-16 LAB — LYMPHOCYTE MARKERS LIMITED
% CD 3 POS. LYMPH.: 70.7 % (ref 57.5–86.2)
AB NK (CD56): 317 /uL (ref 24–406)
ABSOLUTE CD 3: 1485 /uL (ref 622–2402)
ABSOLUTE CD8 CNT: 790 /uL (ref 109–897)
BANDED NEUTROPHILS ABSOLUTE COUNT: 0 10*3/uL (ref 0.0–0.1)
BASOPHILS ABSOLUTE COUNT: 0 10*3/uL (ref 0.0–0.2)
BASOPHILS RELATIVE PERCENT: 0 %
CD4 % HELPER T CELL: 32.1 % (ref 30.8–58.5)
CD4 T CELL ABSOLUTE: 674 /uL (ref 359–1519)
CD4:CD8 RATIO: 0.85 — ABNORMAL LOW (ref 0.92–3.72)
CD8 % SUPPRESSOR T CELL: 37.6 % — ABNORMAL HIGH (ref 12.0–35.5)
EOSINOPHILS ABSOLUTE COUNT: 0.2 10*3/uL (ref 0.0–0.4)
EOSINOPHILS RELATIVE PERCENT: 2 %
HEMATOCRIT: 43.2 % (ref 37.5–51.0)
IMMATURE GRANULOCYTES: 1 %
LYMPHOCYTES ABSOLUTE COUNT: 2.1 10*3/uL (ref 0.7–3.1)
LYMPHOCYTES ABSOLUTE COUNT: 2.1 x10E3/uL — ABNORMAL HIGH (ref 0.7–3.1)
LYMPHOCYTES RELATIVE PERCENT: 29 %
MEAN CORPUSCULAR HEMOGLOBIN CONC: 32.9 g/dL (ref 31.5–35.7)
MEAN CORPUSCULAR HEMOGLOBIN: 29.6 pg (ref 26.6–33.0)
MEAN CORPUSCULAR VOLUME: 90 fL (ref 79–97)
MONOCYTES ABSOLUTE COUNT: 0.7 10*3/uL (ref 0.1–0.9)
MONOCYTES RELATIVE PERCENT: 9 %
NEUTROPHILS ABSOLUTE COUNT: 4.2 10*3/uL (ref 1.4–7.0)
NEUTROPHILS RELATIVE PERCENT: 59 %
PLATELET COUNT: 149 10*3/uL — ABNORMAL LOW (ref 150–450)
RED BLOOD CELL COUNT: 4.79 x10E6/uL (ref 4.14–5.80)
RED CELL DISTRIBUTION WIDTH: 12.8 % (ref 11.6–15.4)
WHITE BLOOD CELL COUNT: 7.2 10*3/uL (ref 3.4–10.8)

## 2018-09-16 LAB — HIV RNA, QUANTITATIVE, PCR

## 2018-09-16 LAB — BASIC METABOLIC PANEL
BLOOD UREA NITROGEN: 12 mg/dL (ref 8–27)
BUN / CREAT RATIO: 7 — ABNORMAL LOW (ref 10–24)
CALCIUM: 10.1 mg/dL (ref 8.6–10.2)
CHLORIDE: 103 mmol/L (ref 96–106)
CO2: 24 mmol/L (ref 20–29)
GFR MDRD AF AMER: 46 mL/min/{1.73_m2} — ABNORMAL LOW
GLUCOSE: 152 mg/dL — ABNORMAL HIGH (ref 65–99)
POTASSIUM: 4.8 mmol/L (ref 3.5–5.2)
SODIUM: 142 mmol/L (ref 134–144)

## 2018-09-28 NOTE — Unmapped (Signed)
Addended byJackelyn Poling on: 09/28/2018 01:59 PM     Modules accepted: Orders

## 2018-10-03 NOTE — Unmapped (Signed)
Done

## 2018-10-10 NOTE — Unmapped (Signed)
[facility] Refill Coordination Note    Specialty Medication(s) to be Shipped:   Infectious Disease: Descovy and Tivicay    Other medication(s) to be shipped: ATORVASTATIN     [name], DOB: November 06, 1950  Phone: [phone] (home)       All above HIPAA information was verified with patient.     Completed refill call assessment today to schedule patient's medication shipment from the [facility] [phone]).       Specialty medication(s) and dose(s) confirmed: Regimen is correct and unchanged.   Changes to medications: Diyari reports no changes at this time.  Changes to insurance: No  Questions for the pharmacist: No    Confirmed patient received Welcome Packet with first shipment. The patient will receive a drug information handout for each medication shipped and additional FDA Medication Guides as required.       DISEASE/MEDICATION-SPECIFIC INFORMATION        N/A    SPECIALTY MEDICATION ADHERENCE     Medication Adherence    Patient reported X missed doses in the last month:  0  Specialty Medication:  TIVICAY  Patient is on additional specialty medications:  Yes  Additional Specialty Medications:  DESCOVY  Patient Reported Additional Medication X Missed Doses in the Last Month:  0                DESCOVY  : 5 days of medicine on hand   TIVICAY  : 5 days of medicine on hand         SHIPPING     Shipping address confirmed in Epic.     Delivery Scheduled: Yes, Expected medication delivery date: 5/13.     Medication will be delivered via Next Day Courier to the prescription address in Epic WAM.    [name]   [facility] Specialty Technician

## 2018-10-11 MED FILL — TIVICAY 50 MG TABLET: 30 days supply | Qty: 30 | Fill #1 | Status: AC

## 2018-10-11 MED FILL — DESCOVY 200 MG-25 MG TABLET: ORAL | 30 days supply | Qty: 30 | Fill #1

## 2018-10-11 MED FILL — DESCOVY 200 MG-25 MG TABLET: 30 days supply | Qty: 30 | Fill #1 | Status: AC

## 2018-10-11 MED FILL — TIVICAY 50 MG TABLET: ORAL | 30 days supply | Qty: 30 | Fill #1

## 2018-10-11 MED FILL — ATORVASTATIN 20 MG TABLET: 90 days supply | Qty: 90 | Fill #0 | Status: AC

## 2018-10-13 ENCOUNTER — Other Ambulatory Visit: Payer: Self-pay | Admitting: Internal Medicine

## 2018-10-13 DIAGNOSIS — R7989 Other specified abnormal findings of blood chemistry: Secondary | ICD-10-CM

## 2018-10-13 DIAGNOSIS — R945 Abnormal results of liver function studies: Secondary | ICD-10-CM

## 2018-10-17 NOTE — Unmapped (Addendum)
PHONE ENCOUNTER 10/18/2018     HISTORY OF PRESENT ILLNESS:      [name] is a 68 year old man with stage G3a-b:A2 chronic kidney disease who is evaluated today via a video encounter.  He recently saw Dr. Marcello Fennel, who detected a recent asymptomatic rise in his serum creatinine from 1.76-2.4 mg/dl. He recently has been inconsistently using his medications.  We talked about restarting his medications at the usual doses and to have follow-up labs again in several weeks.  He does not have fever, cough, chest pain, PND, or orthopnea. He completed an abdominal ultrasound today to further evaluate the recent rise in the creatinine level.  The results of the study are pending.    MEDICATIONS:    Medication Sig   ??? albuterol (PROVENTIL HFA;VENTOLIN HFA) 90 mcg/actuation inhaler Inhale 2 puffs.   ??? allopurinol (ZYLOPRIM) 100 MG tablet Take 2 tablets (200 mg total) by mouth daily.   ??? amLODIPine (NORVASC) 2.5 MG tablet Take 1 tablet (2.5 mg total) by mouth every morning before breakfast.   ??? aspirin (ECOTRIN) 81 MG tablet    ??? atorvastatin (LIPITOR) 20 MG tablet TAKE 1 TABLET (20 MG) BY MOUTH DAILY   ??? baclofen (LIORESAL) 10 MG tablet Take 1 tablet (10 mg total) by mouth Three (3) times a day as needed for muscle spasms.   ??? calcium carbonate-vitamin D2 500 mg(1,250mg ) -200 unit tablet Take 1 tablet by mouth Two (2) times a day.   ??? carvediloL (COREG) 12.5 MG tablet TAKE 1 TABLET BY MOUTH TWICE DAILY   ??? cloNIDine HCl (CATAPRES) 0.1 MG tablet Take 1 tablet (0.1 mg total) by mouth 3 (three) times a day.   ??? cranberry 400 mg cap Take 400 mg by mouth daily.   ??? cyanocobalamin 1000 MCG tablet Take 1,000 mcg by mouth.   ??? diazepam (VALIUM) 5 MG tablet Take 5 mg by mouth nightly.    ??? docusate sodium (COLACE) 100 MG capsule Take 1 capsule (100 mg total) by mouth every twelve (12) hours.   ??? dolutegravir (TIVICAY) 50 mg Tab TABLET TAKE 1 TABLET BY MOUTH DAILY   ??? emtricitabine-tenofovir alafen (DESCOVY) 200-25 mg tablet TAKE 1 TABLET BY MOUTH DAILY   ??? ferrous sulfate 325 (65 FE) MG tablet Take 325 mg by mouth daily with breakfast.    ??? folic acid (FOLVITE) 1 MG tablet    ??? glimepiride (AMARYL) 1 MG tablet take 2 tablet by mouth once daily with BREAKFAST   ??? HYDROcodone-acetaminophen (NORCO) 5-325 mg per tablet Directions are take one tablet 30 minutes before physical therapy, then take one tablet at bedtime as needed for pain   ??? lidocaine (XYLOCAINE) 5 % ointment Apply 1 application topically daily as needed.   ??? losartan (COZAAR) 100 MG tablet Take 100 mg by mouth.   ??? nitroglycerin (NITROSTAT) 0.4 MG SL tablet Place 1 tablet (0.4 mg total) under the tongue every five (5) minutes as needed for chest pain. (Patient not taking: Reported on 07/14/2018)   ??? nystatin (MYCOSTATIN) 100,000 unit/mL suspension Take 5 mL by mouth.   ??? omega-3 fatty acids-fish oil 340-1,000 mg capsule Take 1 capsule by mouth.   ??? omeprazole (PRILOSEC) 20 MG capsule Take 1 capsule (20 mg total) by mouth daily.   ??? ondansetron (ZOFRAN) 4 MG tablet take 1 tablet by mouth every 8 hours if needed for nausea   ??? oxyCODONE-acetaminophen (PERCOCET) 5-325 mg per tablet Take 1 tablet by mouth.   ??? sertraline (ZOLOFT) 50 MG  tablet take 1 tablet by mouth once daily   ??? spironolactone (ALDACTONE) 25 MG tablet Take 1 tablet (25 mg total) by mouth daily.   ??? torsemide (DEMADEX) 20 MG tablet Take 2 tablets (40 mg total) by mouth daily.         REVIEW OF SYSTEMS:  General: No fevers or chills.  Cardiovascular: No chest pain.  All other systems are reviewed and are negative except that noted in the history of present illness.    PHYSICAL EXAMINATION:    GENERAL:   The patient is seen through the video visit and is in no acute distress.    VITAL SIGNS:  BP 140/80  - Pulse 76  - Temp 36.4 ??C (97.6 ??F) (Temporal)  - Wt 221 lb  -      Extremities: There is trace pretibial leg edema bilaterally.    The remainder the examination was deferred secondary to the fact that this was a video visit. LABORATORY STUDIES:          ALT    Collection Time: 09/12/18 10:06 AM   Result Value Ref Range    ALT 46 (H) 0 - 44 IU/L   AST    Collection Time: 09/12/18 10:06 AM   Result Value Ref Range    AST 38 0 - 40 IU/L   Bilirubin, total    Collection Time: 09/12/18 10:06 AM   Result Value Ref Range    Total Bilirubin 0.8 0.0 - 1.2 mg/dL   Basic Metabolic Panel    Collection Time: 09/12/18 10:06 AM   Result Value Ref Range    Glucose 152 (H) 65 - 99 mg/dL    BUN 12 8 - 27 mg/dL    Creatinine 1.61 (H) 0.76 - 1.27 mg/dL    GFR MDRD Non Af Amer 39 (L) >59 mL/min/1.73    GFR MDRD Af Amer 46 (L) >59 mL/min/1.73    BUN/Creatinine Ratio 7 (L) 10 - 24    Sodium 142 134 - 144 mmol/L    Potassium 4.8 3.5 - 5.2 mmol/L    Chloride 103 96 - 106 mmol/L    CO2 24 20 - 29 mmol/L    Calcium 10.1 8.6 - 10.2 mg/dL   HIV RNA, Quantitative, PCR    Collection Time: 09/12/18 10:06 AM   Result Value Ref Range    HIV RNA <40 copies/mL    HIV RNA Log(10) CANCELED log10copy/mL   Lymphocyte Markers Limited    Collection Time: 09/12/18 10:06 AM   Result Value Ref Range    % NK (CD56/16) 15.1 1.4 - 19.4 %    Ab NK (CD56) 317 24 - 406 /uL    Absolute CD 3 1,485 622 - 2,402 /uL    CD4 T Cell Abs 674 359 - 1,519 /uL    Absolute CD8 Count 790 109 - 897 /uL    % CD 3 Pos. Lymph. 70.7 57.5 - 86.2 %    CD4 % Helper T Cell 32.1 30.8 - 58.5 %    CD8 % Suppressor T Cell 37.6 (H) 12.0 - 35.5 %    CD4:CD8 Ratio 0.85 (L) 0.92 - 3.72    WBC 7.2 3.4 - 10.8 x10E3/uL    RBC 4.79 4.14 - 5.80 x10E6/uL    HGB 14.2 13.0 - 17.7 g/dL    HCT 09.6 04.5 - 40.9 %    MCV 90 79 - 97 fL    MCH 29.6 26.6 - 33.0 pg    MCHC  32.9 31.5 - 35.7 g/dL    RDW 38.7 56.4 - 33.2 %    Platelet 149 (L) 150 - 450 x10E3/uL    Neutrophils % 59 Not Estab. %    Lymphocytes % 29 Not Estab. %    Monocytes % 9 Not Estab. %    Eosinophils % 2 Not Estab. %    Basophils % 0 Not Estab. %    Absolute Neutrophils 4.2 1.4 - 7.0 x10E3/uL    Absolute Lymphocytes 2.1 0.7 - 3.1 x10E3/uL    Absolute Monocytes 0.7 0.1 - 0.9 x10E3/uL    Absolute Eosinophils 0.2 0.0 - 0.4 x10E3/uL    Absolute Basophils  0.0 0.0 - 0.2 x10E3/uL    Immature Granulocytes 1 Not Estab. %    Bands Absolute 0.0 0.0 - 0.1 x10E3/uL     Serum creatinine trend:    Date Value   10/12/2018 2.40 (H)   09/12/2018 1.76 (H)   06/08/2018 1.65 (H)   03/08/2018 1.75 (H)   12/01/2017 1.73 (H)   11/02/2017 1.49 (H)   08/17/2017 1.97 (H)   04/02/2017 2.03 (H)       IMPRESSION AND PLAN:    1.  Stage G3a-b:A2 chronic kidney disease.  The etiology of the recent rise in the serum creatinine is not entirely clear.  At his last visit, he was doing quite well on his prescribed medications.  I reviewed his medication list with him again in detail and sent him a list electronically for reference.  I suggested that he should restart these and have repeat laboratory studies again in several weeks.    2.  Hypertension.  His blood pressure is not at target level today.  However, his recent adherence has been somewhat suboptimal.  He is motivated to restart his antihypertensives and diuretic agents as prescribed.    3.  Human immunodeficiency virus disease.  He he continues to do well on HAART therapy.    4.  Lower urinary tract symptoms.  Continue tamsulosin.    5.  Borderline hypercalcemia with normal 1,25 dihydroxy vitamin D level and slightly elevated PTH consistent with secondary hyperparathyroidism related to his chronic kidney disease.    6.  Nephrolithiasis.  Will review the ultrasound report once available.    7.  Follow up plans.  Will see him back again in about 3 months.  He will have repeat laboratory studies performed in about 2 to 4 weeks.  A standing lab order is in place for both [facility] health labs as well as for [facility].    The visit started as a video encounter but due to a poor video signal the encounter was changed to a telephone encounter.  The visit should be billed as a scheduled telephone encounter [phone] (21 to 31 minutes).    I spent 25 minutes on the phone with the patient. I spent an additional 20 minutes on pre- and post-visit activities.     The patient was physically located in [place] or a state in which I am permitted to provide care. The patient and/or parent/gauardian understood that s/he may incur co-pays and cost sharing, and agreed to the telemedicine visit. The visit was completed via phone and/or video, which was appropriate and reasonable under the circumstances given the patient's presentation at the time.    The patient and/or parent/guardian has been advised of the potential risks and limitations of this mode of treatment (including, but not limited to, the absence of in-person examination) and has agreed  to be treated using telemedicine. The patient's/patient's family's questions regarding telemedicine have been answered.     If the phone/video visit was completed in an ambulatory setting, the patient and/or parent/guardian has also been advised to contact their provider???s office for worsening conditions, and seek emergency medical treatment and/or call 911 if the patient deems either necessary.      Zetta Bills. Stefano Gaul, MD  Date of service 10/18/2018

## 2018-10-18 ENCOUNTER — Other Ambulatory Visit: Payer: Self-pay

## 2018-10-18 ENCOUNTER — Ambulatory Visit
Admission: RE | Admit: 2018-10-18 | Discharge: 2018-10-18 | Disposition: A | Discharge status: Home / Self Care | Payer: Medicare Other | Source: Ambulatory Visit | Attending: Internal Medicine | Admitting: Internal Medicine

## 2018-10-18 ENCOUNTER — Telehealth: Admit: 2018-10-18 | Discharge: 2018-10-19 | Payer: MEDICARE | Attending: Nephrology | Primary: Nephrology

## 2018-10-18 DIAGNOSIS — N2889 Other specified disorders of kidney and ureter: Secondary | ICD-10-CM

## 2018-10-18 DIAGNOSIS — I5033 Acute on chronic diastolic (congestive) heart failure: Secondary | ICD-10-CM

## 2018-10-18 DIAGNOSIS — I151 Hypertension secondary to other renal disorders: Principal | ICD-10-CM

## 2018-10-18 DIAGNOSIS — B20 Human immunodeficiency virus [HIV] disease: Secondary | ICD-10-CM

## 2018-10-18 DIAGNOSIS — R001 Bradycardia, unspecified: Secondary | ICD-10-CM

## 2018-10-18 DIAGNOSIS — N183 Chronic kidney disease, stage 3 (moderate): Secondary | ICD-10-CM

## 2018-10-18 DIAGNOSIS — E8770 Fluid overload, unspecified: Secondary | ICD-10-CM

## 2018-10-18 DIAGNOSIS — K7469 Other cirrhosis of liver: Secondary | ICD-10-CM

## 2018-10-18 DIAGNOSIS — I2510 Atherosclerotic heart disease of native coronary artery without angina pectoris: Secondary | ICD-10-CM

## 2018-10-18 DIAGNOSIS — R7989 Other specified abnormal findings of blood chemistry: Secondary | ICD-10-CM

## 2018-10-18 DIAGNOSIS — R945 Abnormal results of liver function studies: Secondary | ICD-10-CM | POA: Diagnosis not present

## 2018-10-18 MED ORDER — AMLODIPINE 2.5 MG TABLET
ORAL_TABLET | Freq: Every morning | ORAL | 3 refills | 0 days | Status: CP
Start: 2018-10-18 — End: ?

## 2018-10-18 MED ORDER — SPIRONOLACTONE 25 MG TABLET
ORAL_TABLET | Freq: Every day | ORAL | 3 refills | 0.00000 days | Status: CP
Start: 2018-10-18 — End: 2019-10-18

## 2018-10-18 MED ORDER — CARVEDILOL 12.5 MG TABLET
ORAL_TABLET | Freq: Two times a day (BID) | ORAL | 3 refills | 0.00000 days | Status: CP
Start: 2018-10-18 — End: ?

## 2018-10-18 MED ORDER — TORSEMIDE 20 MG TABLET
ORAL_TABLET | Freq: Every day | ORAL | 3 refills | 0.00000 days | Status: CP
Start: 2018-10-18 — End: 2019-10-18

## 2018-10-18 MED ORDER — LOSARTAN 100 MG TABLET
ORAL_TABLET | Freq: Every day | ORAL | 3 refills | 0 days | Status: CP
Start: 2018-10-18 — End: ?

## 2018-10-18 MED ORDER — CLONIDINE HCL 0.1 MG TABLET
ORAL_TABLET | Freq: Three times a day (TID) | ORAL | 3 refills | 0.00000 days | Status: CP
Start: 2018-10-18 — End: 2019-10-18

## 2018-10-18 NOTE — Unmapped (Signed)
Addended byJackelyn Poling on: 10/18/2018 09:49 AM     Modules accepted: Level of Service

## 2018-10-18 NOTE — Unmapped (Signed)
1.  Resume your medications as prescribed.  I have placed refills for your medications relevant to your blood pressure and diuretic management.    2.  Return again in about 3 to 4 months.    3.  Repeat laboratory studies again within the next 2 to 4 weeks.

## 2018-10-18 NOTE — Unmapped (Signed)
Addended by: Jackelyn Poling on: 10/18/2018 01:30 PM     Modules accepted: Orders

## 2018-10-26 MED ORDER — TAMSULOSIN 0.4 MG CAPSULE
ORAL_CAPSULE | 3 refills | 0 days | Status: CP
Start: 2018-10-26 — End: ?

## 2018-10-26 NOTE — Unmapped (Signed)
Pt last seen on 10/18/2018. Office note reviewed and pt to continue Tamsulosin 0.4mg  oral daily. Rx written for #90 with 3 refills. Pt to follow up in 3 months, on recall list.

## 2018-11-01 ENCOUNTER — Telehealth: Payer: Self-pay | Admitting: Internal Medicine

## 2018-11-02 ENCOUNTER — Other Ambulatory Visit: Payer: Self-pay

## 2018-11-02 ENCOUNTER — Inpatient Hospital Stay: Payer: Medicare Other | Attending: Internal Medicine

## 2018-11-02 ENCOUNTER — Inpatient Hospital Stay (HOSPITAL_BASED_OUTPATIENT_CLINIC_OR_DEPARTMENT_OTHER): Payer: Medicare Other | Admitting: Internal Medicine

## 2018-11-02 DIAGNOSIS — D631 Anemia in chronic kidney disease: Secondary | ICD-10-CM

## 2018-11-02 DIAGNOSIS — N183 Chronic kidney disease, stage 3 unspecified: Secondary | ICD-10-CM

## 2018-11-02 DIAGNOSIS — D696 Thrombocytopenia, unspecified: Secondary | ICD-10-CM | POA: Diagnosis not present

## 2018-11-02 LAB — COMPREHENSIVE METABOLIC PANEL
ALT: 38 U/L (ref 0–44)
AST: 30 U/L (ref 15–41)
Albumin: 4.1 g/dL (ref 3.5–5.0)
Alkaline Phosphatase: 66 U/L (ref 38–126)
Anion gap: 9 (ref 5–15)
BUN: 16 mg/dL (ref 8–23)
CO2: 23 mmol/L (ref 22–32)
Calcium: 9.6 mg/dL (ref 8.9–10.3)
Chloride: 108 mmol/L (ref 98–111)
Creatinine, Ser: 1.93 mg/dL — ABNORMAL HIGH (ref 0.61–1.24)
GFR calc Af Amer: 41 mL/min — ABNORMAL LOW (ref >60)
GFR calc non Af Amer: 35 mL/min — ABNORMAL LOW (ref >60)
Glucose, Bld: 211 mg/dL — ABNORMAL HIGH (ref 70–99)
Potassium: 4.6 mmol/L (ref 3.5–5.1)
Sodium: 140 mmol/L (ref 135–145)
Total Bilirubin: 1.1 mg/dL (ref 0.3–1.2)
Total Protein: 7.7 g/dL (ref 6.5–8.1)

## 2018-11-02 LAB — CBC WITH DIFFERENTIAL/PLATELET
Abs Immature Granulocytes: 0.02 10*3/uL (ref 0.00–0.07)
Basophils Absolute: 0 10*3/uL (ref 0.0–0.1)
Basophils Relative: 1 %
Eosinophils Absolute: 0.2 10*3/uL (ref 0.0–0.5)
Eosinophils Relative: 3 %
HCT: 40.3 % (ref 39.0–52.0)
Hemoglobin: 13.7 g/dL (ref 13.0–17.0)
Immature Granulocytes: 0 %
Lymphocytes Relative: 30 %
Lymphs Abs: 1.9 10*3/uL (ref 0.7–4.0)
MCH: 30.1 pg (ref 26.0–34.0)
MCHC: 34 g/dL (ref 30.0–36.0)
MCV: 88.6 fL (ref 80.0–100.0)
Monocytes Absolute: 0.5 10*3/uL (ref 0.1–1.0)
Monocytes Relative: 8 %
Neutro Abs: 3.8 10*3/uL (ref 1.7–7.7)
Neutrophils Relative %: 58 %
Platelets: 141 10*3/uL — ABNORMAL LOW (ref 150–400)
RBC: 4.55 MIL/uL (ref 4.22–5.81)
RDW: 12.7 % (ref 11.5–15.5)
WBC: 6.5 10*3/uL (ref 4.0–10.5)
nRBC: 0 % (ref 0.0–0.2)

## 2018-11-02 LAB — IRON AND TIBC
Iron: 79 ug/dL (ref 45–182)
Saturation Ratios: 20 % (ref 17.9–39.5)
TIBC: 402 ug/dL (ref 250–450)
UIBC: 323 ug/dL

## 2018-11-02 LAB — FERRITIN: Ferritin: 27 ng/mL (ref 24–336)

## 2018-11-02 NOTE — Progress Notes (Signed)
I connected with Jared Walker on 11/02/2018 at  1:30 PM EDT by telephone visit and verified that I am speaking with the correct person using two identifiers.  I discussed the limitations, risks, security and privacy concerns of performing an evaluation and management service by telemedicine and the availability of in-person appointments. I also discussed with the patient that there may be a patient responsible charge related to this service. The patient expressed understanding and agreed to proceed.    Other persons participating in the visit and their role in the encounter: RN medical reconciliation Patient's location: Home Provider's location: office   No history exists.     Chief Complaint: anemia    History of present illness:Jared Walker 68 y.o.  male with history of mild anemia secondary to CKD currently on p.o. iron is here for follow-up.  Patient denies any blood in stools or black or stools.  Denies any nausea vomiting.  Appetite is good with no weight loss.  He continues to be compliant with his HIV medications.  He denies any bleeding.  Observation/objective:  Assessment and plan: Anemia due to stage 3 chronic kidney disease (HCC) #Iron deficient anemia secondary to CKD stage III-on p.o. iron twice a day.  # Today hemoglobin 13.7; continue p.o. iron.  Iron saturation 20%.  # CKD stage III-creatinine 1.9 [Dr.Hladik; [facility]] overall stable.  #HIV/Hep C [s/p anti-viral]-under good control as per patient; on retrovirals. Stable.   # Intermittent mild thrombocytopenia platelets 147 stable.  # DISPOSITION: # Recommend follow-up in 6 months -MD/labs-cbc/cmp/iron studoes/ferritin-Dr.B    Follow-up instructions:  I discussed the assessment and treatment plan with the patient.  The patient was provided an opportunity to ask questions and all were answered.  The patient agreed with the plan and demonstrated understanding of instructions.  The patient was  advised to call back or seek an in person evaluation if the symptoms worsen or if the condition fails to improve as anticipated.  I provided 12 minutes of non face-to-face telephone visit time during this encounter, and > 50% was spent counseling as documented under my assessment & plan.   Dr. Charlaine Dalton [place] [facility] 11/09/2018 1:28 PM

## 2018-11-02 NOTE — Assessment & Plan Note (Addendum)
#  Iron deficient anemia secondary to CKD stage III-on p.o. iron twice a day.  # Today hemoglobin 13.7; continue p.o. iron.  Iron saturation 20%.  # CKD stage III-creatinine 1.9 [Dr.Hladik; [facility]] overall stable.  #HIV/Hep C [s/p anti-viral]-under good control as per patient; on retrovirals. Stable.   # Intermittent mild thrombocytopenia platelets 147 stable.  # DISPOSITION: # Recommend follow-up in 6 months -MD/labs-cbc/cmp/iron studoes/ferritin-Dr.B

## 2018-11-04 NOTE — Unmapped (Signed)
[facility] Refill Coordination Note    Specialty Medication(s) to be Shipped:   Infectious Disease: Descovy and Tivicay    Other medication(s) to be shipped: N/A     [name], DOB: [birth year]  Phone: [phone] (home)       All above HIPAA information was verified with patient.     Completed refill call assessment today to schedule patient's medication shipment from the [facility] [phone]).       Specialty medication(s) and dose(s) confirmed: Regimen is correct and unchanged.   Changes to medications: Julis reports no changes at this time.  Changes to insurance: No  Questions for the pharmacist: No    Confirmed patient received Welcome Packet with first shipment. The patient will receive a drug information handout for each medication shipped and additional FDA Medication Guides as required.       DISEASE/MEDICATION-SPECIFIC INFORMATION        N/A    SPECIALTY MEDICATION ADHERENCE     Medication Adherence    Patient reported X missed doses in the last month:  0  Specialty Medication:  DESCOVY  Patient is on additional specialty medications:  Yes  Additional Specialty Medications:  TIVICAY  Patient Reported Additional Medication X Missed Doses in the Last Month:  0                TIVICAY  : 6 days of medicine on hand   DESCOVY  : 6 days of medicine on hand          SHIPPING     Shipping address confirmed in Epic.     Delivery Scheduled: Yes, Expected medication delivery date: 6/10 PER PATIENT REQUEST.     Medication will be delivered via Next Day Courier to the prescription address in Epic WAM.    [name]   [facility] Specialty Technician

## 2018-11-08 NOTE — Unmapped (Signed)
Samuel [name] 's descovy and tivicay shipment will be delayed due to Can not fill at [facility] We have contacted the patient and communicated the delivery change to patient/caregiver.

## 2018-11-08 NOTE — Unmapped (Signed)
This patient has been disenrolled from the [facility] specialty pharmacy services due to a pharmacy change. The patient is now filling at [facility].[phone])    Roderic Palau  [facility] Specialty Pharmacist

## 2018-11-09 MED ORDER — EMTRICITABINE 200 MG-TENOFOVIR ALAFENAMIDE FUMARATE 25 MG TABLET
ORAL_TABLET | ORAL | 11 refills | 0 days | Status: CP
Start: 2018-11-09 — End: 2019-11-09

## 2018-11-09 MED ORDER — DOLUTEGRAVIR 50 MG TABLET
ORAL | 11 refills | 0 days | Status: CP
Start: 2018-11-09 — End: 2019-11-09

## 2018-11-24 ENCOUNTER — Telehealth: Admit: 2018-11-24 | Discharge: 2018-11-25 | Payer: MEDICARE | Attending: Family | Primary: Family

## 2018-11-24 ENCOUNTER — Ambulatory Visit: Admit: 2018-11-24 | Discharge: 2018-11-25 | Payer: MEDICARE

## 2018-11-24 DIAGNOSIS — Z1321 Encounter for screening for nutritional disorder: Secondary | ICD-10-CM

## 2018-11-24 DIAGNOSIS — K7460 Unspecified cirrhosis of liver: Principal | ICD-10-CM

## 2018-11-30 MED ORDER — DORZOLAMIDE 22.3 MG-TIMOLOL 6.8 MG/ML EYE DROPS
0.00000 days
Start: 2018-11-30 — End: ?

## 2018-12-05 MED ORDER — NITROGLYCERIN 0.4 MG SUBLINGUAL TABLET
ORAL_TABLET | 0 refills | 0 days | Status: CP
Start: 2018-12-05 — End: ?

## 2018-12-23 ENCOUNTER — Other Ambulatory Visit: Payer: Self-pay | Admitting: Internal Medicine

## 2018-12-23 DIAGNOSIS — Z20822 Contact with and (suspected) exposure to covid-19: Secondary | ICD-10-CM

## 2018-12-26 LAB — NOVEL CORONAVIRUS, NAA: SARS-CoV-2, NAA: NOT DETECTED

## 2019-01-12 MED ORDER — ONETOUCH VERIO TEST STRIPS
0.00000 days
Start: 2019-01-12 — End: ?

## 2019-02-13 DIAGNOSIS — N183 Chronic kidney disease, stage 3 (moderate): Secondary | ICD-10-CM

## 2019-03-03 ENCOUNTER — Other Ambulatory Visit: Payer: Self-pay

## 2019-03-03 DIAGNOSIS — Z20822 Contact with and (suspected) exposure to covid-19: Secondary | ICD-10-CM

## 2019-03-04 LAB — NOVEL CORONAVIRUS, NAA: SARS-CoV-2, NAA: NOT DETECTED

## 2019-03-06 ENCOUNTER — Telehealth: Payer: Self-pay

## 2019-03-06 NOTE — Telephone Encounter (Signed)
Wife given results of COVID test.

## 2019-03-13 DIAGNOSIS — N183 Chronic kidney disease, stage 3 (moderate): Principal | ICD-10-CM

## 2019-04-10 DIAGNOSIS — N183 Chronic kidney disease, stage 3 (moderate): Principal | ICD-10-CM

## 2019-05-05 ENCOUNTER — Inpatient Hospital Stay: Payer: Medicare Other | Admitting: Internal Medicine

## 2019-05-05 ENCOUNTER — Inpatient Hospital Stay: Payer: Medicare Other | Attending: Internal Medicine

## 2019-05-05 DIAGNOSIS — D696 Thrombocytopenia, unspecified: Secondary | ICD-10-CM | POA: Insufficient documentation

## 2019-05-05 DIAGNOSIS — Z21 Asymptomatic human immunodeficiency virus [HIV] infection status: Secondary | ICD-10-CM | POA: Insufficient documentation

## 2019-05-05 DIAGNOSIS — N1832 Chronic kidney disease, stage 3b: Secondary | ICD-10-CM | POA: Insufficient documentation

## 2019-05-05 DIAGNOSIS — E611 Iron deficiency: Secondary | ICD-10-CM | POA: Insufficient documentation

## 2019-05-05 DIAGNOSIS — D631 Anemia in chronic kidney disease: Secondary | ICD-10-CM | POA: Insufficient documentation

## 2019-05-05 NOTE — Assessment & Plan Note (Deleted)
#  Iron deficient anemia secondary to CKD stage III-on p.o. iron twice a day.  # Today hemoglobin 13.7; continue p.o. iron.  Iron saturation 20%.  # CKD stage III-creatinine 1.9 [Dr.Hladik; [facility]] overall stable.  #HIV/Hep C [s/p anti-viral]-under good control as per patient; on retrovirals. Stable.   # Intermittent mild thrombocytopenia platelets 147 stable.  # DISPOSITION: # Recommend follow-up in 6 months -MD/labs-cbc/cmp/iron studoes/ferritin-Dr.B 

## 2019-05-05 NOTE — Progress Notes (Deleted)
[facility] NOTE  Patient Care Team: Tracie Harrier, MD as PCP - General (Internal Medicine)  CHIEF COMPLAINTS/PURPOSE OF CONSULTATION:   # SEVERE IRON DEFICIENCY ANEMIA- [EGD Aug 2016-esophageal ulcer; colonoscopy-? 2001]; EGD- April 2017- Neg. UA- neg.   # HIV- Dr.Idarmore; [facility]/Hepatitis C [no treatment]  # CKD creat- 1.6- 2.0   HISTORY OF PRESENTING ILLNESS:  Jared Walker 68 y.o.  male history of chronic kidney disease/ and the severe iron deficiency is here for follow-up.  Patient denies any blood in stools or black-colored stools.  He continues to be on iron pills.  No nausea no vomiting.  No chest pain.  No cough or shortness of breath.  He admits to compliance with his HIV medications.  Denies any opportunistic infections.  Review of Systems  Constitutional: Negative for chills, diaphoresis, fever, malaise/fatigue and weight loss.  HENT: Negative for nosebleeds and sore throat.   Eyes: Negative for double vision.  Respiratory: Negative for cough, hemoptysis, sputum production, shortness of breath and wheezing.   Cardiovascular: Negative for chest pain, palpitations, orthopnea and leg swelling.  Gastrointestinal: Negative for abdominal pain, blood in stool, constipation, diarrhea, heartburn, melena, nausea and vomiting.  Genitourinary: Negative for dysuria, frequency and urgency.  Musculoskeletal: Negative for back pain and joint pain.  Skin: Negative.  Negative for itching and rash.  Neurological: Negative for dizziness, tingling, focal weakness, weakness and headaches.  Endo/Heme/Allergies: Does not bruise/bleed easily.  Psychiatric/Behavioral: Negative for depression. The patient is not nervous/anxious and does not have insomnia.      MEDICAL HISTORY:  Past Medical History:  Diagnosis Date  . Anemia   . Aortic stenosis, mild    mild AS by echo, 08/2011  . Cervical disc herniation   . CHF (congestive heart failure) ([place])   . Chronic  kidney disease    kidney stones. chronic kidney disease, stage III. followed by doctor in [place]  . Cirrhosis ([place])    due to hepatitis c not being treated for a long periiod of time  . Coronary artery disease   . Depression   . Diabetes mellitus without complication ([place]) 0000000   A1C 10.0  . Full dentures    upper and lower  . GERD (gastroesophageal reflux disease)   . Gout   . Heart murmur    no treatment  . Hep C w/o coma, chronic ([place]) 01/19/2015   took treatment and it is now gone.   . Hepatitis C 09/25/11  . HIV positive ([place]) 09/25/2011   nondetectable.   . Hypertension   . MI (myocardial infarction) ([place]) 2017   NSTEMI  . Neuromuscular disorder ([facility])    numbness in fingers most likely due to shoulder damage.    SURGICAL HISTORY: Past Surgical History:  Procedure Laterality Date  . BACK SURGERY  1985   lumbar laminectomy. metal in pelvis  . bulging disc Bilateral 1990   L4-L5  . CARDIOVASCULAR STRESS TEST     at [facility]  . CHOLECYSTECTOMY    . CLOSED REDUCTION PELVIC FRACTURE    . ESOPHAGOGASTRODUODENOSCOPY (EGD) WITH PROPOFOL N/A 01/21/2015   Procedure: ESOPHAGOGASTRODUODENOSCOPY (EGD) WITH PROPOFOL;  Surgeon: Manya Silvas, MD;  Location: [facility] ENDOSCOPY;  Service: Endoscopy;  Laterality: N/A;  . ESOPHAGOGASTRODUODENOSCOPY (EGD) WITH PROPOFOL N/A 09/04/2015   Procedure: ESOPHAGOGASTRODUODENOSCOPY (EGD) WITH PROPOFOL;  Surgeon: Manya Silvas, MD;  Location: [facility] ENDOSCOPY;  Service: Endoscopy;  Laterality: N/A;  . FRACTURE SURGERY Right    hand.  pins in hand  from long ago  . KNEE ARTHROSCOPY  2009   Right  . LUMBAR LAMINECTOMY/DECOMPRESSION MICRODISCECTOMY  09/25/2011   Procedure: LUMBAR LAMINECTOMY/DECOMPRESSION MICRODISCECTOMY;  Surgeon: Erline Levine, MD;  Location: [facility] NEURO ORS;  Service: Neurosurgery;  Laterality: Left;  Left Lumbar Four-Five Microdiskectomy  . NEPHROSTOMY     tube Left  . SHOULDER ARTHROSCOPY WITH OPEN ROTATOR CUFF REPAIR Right  10/12/2014   Procedure: SHOULDER ARTHROSCOP with decompression;  Surgeon: Leanor Kail, MD;  Location: [facility];  Service: Orthopedics;  Laterality: Right;  . SHOULDER ARTHROSCOPY WITH OPEN ROTATOR CUFF REPAIR Left 07/08/2017   Procedure: SHOULDER ARTHROSCOPY WITH OPEN ROTATOR CUFF REPAIR;  Surgeon: Corky Mull, MD;  Location: [facility] ORS;  Service: Orthopedics;  Laterality: Left;    SOCIAL HISTORY: Social History   Socioeconomic History  . Marital status: Married    Spouse name: Not on file  . Number of children: Not on file  . Years of education: Not on file  . Highest education level: Not on file  Occupational History  . Not on file  Social Needs  . Financial resource strain: Not on file  . Food insecurity    Worry: Not on file    Inability: Not on file  . Transportation needs    Medical: Not on file    Non-medical: Not on file  Tobacco Use  . Smoking status: Former Smoker    Packs/day: 0.50    Types: Cigarettes    Quit date: 06/1978    Years since quitting: 40.9  . Smokeless tobacco: Former Systems developer    Quit date: 03/13/1972  Substance and Sexual Activity  . Alcohol use: No  . Drug use: No  . Sexual activity: Not on file  Lifestyle  . Physical activity    Days per week: Not on file    Minutes per session: Not on file  . Stress: Not on file  Relationships  . Social Herbalist on phone: Not on file    Gets together: Not on file    Attends religious service: Not on file    Active member of club or organization: Not on file    Attends meetings of clubs or organizations: Not on file    Relationship status: Not on file  . Intimate partner violence    Fear of current or ex partner: Not on file    Emotionally abused: Not on file    Physically abused: Not on file    Forced sexual activity: Not on file  Other Topics Concern  . Not on file  Social History Narrative   Independent at baseline. Ambulates with a cane. Lives at home with his wife     FAMILY HISTORY: Family History  Problem Relation Age of Onset  . Hypertension Mother   . Cancer Father   . Heart disease Sister   . Diabetes Brother   . Anesthesia problems Neg Hx     ALLERGIES:  is allergic to oxycodone-acetaminophen; buchu-cornsilk-ch grass-hydran; colchicine; nifedipine; and tramadol.  MEDICATIONS:  Current Outpatient Medications  Medication Sig Dispense Refill  . albuterol (PROVENTIL HFA;VENTOLIN HFA) 108 (90 Base) MCG/ACT inhaler Inhale 1-2 puffs into the lungs every 6 (six) hours as needed for wheezing or shortness of breath.     . allopurinol (ZYLOPRIM) 100 MG tablet Take 300 mg by mouth daily.     Marland Kitchen amLODipine (NORVASC) 5 MG tablet Take 5 mg by mouth daily.     Marland Kitchen aspirin EC 81 MG EC tablet  Take 1 tablet (81 mg total) by mouth daily. 120 tablet 0  . atorvastatin (LIPITOR) 80 MG tablet Take 80 mg by mouth daily. Take in the morning.    . carvedilol (COREG) 12.5 MG tablet Take 12.5 mg by mouth 2 (two) times daily with a meal.    . Cholecalciferol (VITAMIN D-1000 MAX ST) 1000 units tablet Take 1,000 Units by mouth daily.    . cloNIDine (CATAPRES) 0.1 MG tablet Take 0.1 mg by mouth 3 (three) times daily.    . Cranberry 400 MG CAPS Take 400 mg by mouth daily.    . DESCOVY 200-25 MG tablet Take 1 tablet by mouth at bedtime.   3  . diazepam (VALIUM) 5 MG tablet Take 5 mg by mouth at bedtime.     . dolutegravir (TIVICAY) 50 MG tablet Take 50 mg by mouth at bedtime.     . donepezil (ARICEPT) 10 MG tablet Take 10 mg by mouth at bedtime.     . ferrous sulfate 325 (65 FE) MG tablet Take 325 mg by mouth daily with breakfast.     . folic acid (FOLVITE) 1 MG tablet Take 1 mg by mouth daily. In the morning    . glimepiride (AMARYL) 2 MG tablet Take 2 mg by mouth daily with breakfast.    . HYDROcodone-acetaminophen (NORCO/VICODIN) 5-325 MG tablet Directions are take one tablet 30 minutes before physical therapy, then take one tablet at bedtime as needed for pain    .  losartan (COZAAR) 100 MG tablet Take 100 mg by mouth every morning.   0  . nystatin (MYCOSTATIN) 100000 UNIT/ML suspension Take 5 mLs by mouth 4 (four) times daily as needed for mouth pain.    . Omega-3 Fatty Acids (FISH OIL PO) Take by mouth.    . ondansetron (ZOFRAN) 4 MG tablet TAKE 1 TABLET BY MOUTH EVERY 8 HOURS IF NEEDED FOR NAUSEA  0  . pantoprazole (PROTONIX) 40 MG tablet Take 1 tablet by mouth daily.    . sertraline (ZOLOFT) 50 MG tablet Take 50 mg by mouth every evening.     . tamsulosin (FLOMAX) 0.4 MG CAPS capsule Take 1 capsule by mouth daily.    Marland Kitchen torsemide (DEMADEX) 20 MG tablet Take 1 tablet (20 mg total) by mouth daily. 30 tablet 5   No current facility-administered medications for this visit.       Marland Kitchen  PHYSICAL EXAMINATION:   There were no vitals filed for this visit. There were no vitals filed for this visit.  Physical Exam  Constitutional: He is oriented to person, place, and time and well-developed, well-nourished, and in no distress.  Is alone.  HENT:  Head: Normocephalic and atraumatic.  Mouth/Throat: Oropharynx is clear and moist. No oropharyngeal exudate.  Eyes: Pupils are equal, round, and reactive to light.  Neck: Normal range of motion. Neck supple.  Cardiovascular: Normal rate and regular rhythm.  Pulmonary/Chest: No respiratory distress. He has no wheezes.  Abdominal: Soft. Bowel sounds are normal. He exhibits no distension and no mass. There is no abdominal tenderness. There is no rebound and no guarding.  Musculoskeletal: Normal range of motion.        General: No tenderness or edema.  Neurological: He is alert and oriented to person, place, and time.  Skin: Skin is warm.  Psychiatric: Affect normal.     LABORATORY DATA:  I have reviewed the data as listed Lab Results  Component Value Date   WBC 6.5 11/02/2018   HGB  13.7 11/02/2018   HCT 40.3 11/02/2018   MCV 88.6 11/02/2018   PLT 141 (L) 11/02/2018   Recent Labs    11/02/18 0856  NA  140  K 4.6  CL 108  CO2 23  GLUCOSE 211*  BUN 16  CREATININE 1.93*  CALCIUM 9.6  GFRNONAA 35*  GFRAA 41*  PROT 7.7  ALBUMIN 4.1  AST 30  ALT 38  ALKPHOS 66  BILITOT 1.1     ASSESSMENT & PLAN:   No problem-specific Assessment & Plan notes found for this encounter.      Cammie Sickle, MD 05/05/2019 8:21 AM

## 2019-05-08 DIAGNOSIS — N183 Chronic kidney disease, stage 3 (moderate): Principal | ICD-10-CM

## 2019-05-09 ENCOUNTER — Telehealth: Admit: 2019-05-09 | Discharge: 2019-05-10 | Payer: MEDICARE | Attending: Nephrology | Primary: Nephrology

## 2019-05-16 ENCOUNTER — Inpatient Hospital Stay: Payer: Medicare Other

## 2019-05-16 ENCOUNTER — Inpatient Hospital Stay: Payer: Medicare Other | Admitting: Internal Medicine

## 2019-05-16 DIAGNOSIS — B20 Human immunodeficiency virus [HIV] disease: Principal | ICD-10-CM

## 2019-05-16 NOTE — Progress Notes (Unsigned)
I connected with Jared Walker on 05/16/19 at  3:00 PM EST by {Blank single:19197::"video enabled telemedicine visit","telephone visit"} and verified that I am speaking with the correct person using two identifiers.  I discussed the limitations, risks, security and privacy concerns of performing an evaluation and management service by telemedicine and the availability of in-person appointments. I also discussed with the patient that there may be a patient responsible charge related to this service. The patient expressed understanding and agreed to proceed.    Other persons participating in the visit and their role in the encounter: RN/medical reconciliation Patient's location: *** Provider's location: Home  Oncology History   No history exists.     Chief Complaint: ***    History of present illness:Jared Walker 68 y.o.  male with history of   Observation/objective:  Assessment and plan: No problem-specific Assessment & Plan notes found for this encounter.    Follow-up instructions:  I discussed the assessment and treatment plan with the patient.  The patient was provided an opportunity to ask questions and all were answered.  The patient agreed with the plan and demonstrated understanding of instructions.  The patient was advised to call back or seek an in person evaluation if the symptoms worsen or if the condition fails to improve as anticipated.  I provided *** minutes of {Blank single:19197::"face-to-face video visit time","non face-to-face telephone visit time"} during this encounter, and > 50% was spent counseling as documented under my assessment & plan.   Dr. Charlaine Dalton Walker at [facility] 05/16/2019 7:38 AM

## 2019-05-18 ENCOUNTER — Other Ambulatory Visit: Payer: Self-pay

## 2019-05-18 ENCOUNTER — Inpatient Hospital Stay: Payer: Medicare Other

## 2019-05-18 DIAGNOSIS — N1832 Chronic kidney disease, stage 3b: Secondary | ICD-10-CM | POA: Diagnosis present

## 2019-05-18 DIAGNOSIS — D631 Anemia in chronic kidney disease: Secondary | ICD-10-CM

## 2019-05-18 DIAGNOSIS — Z21 Asymptomatic human immunodeficiency virus [HIV] infection status: Secondary | ICD-10-CM | POA: Diagnosis not present

## 2019-05-18 DIAGNOSIS — D696 Thrombocytopenia, unspecified: Secondary | ICD-10-CM | POA: Diagnosis not present

## 2019-05-18 DIAGNOSIS — N183 Chronic kidney disease, stage 3 unspecified: Secondary | ICD-10-CM

## 2019-05-18 DIAGNOSIS — E611 Iron deficiency: Secondary | ICD-10-CM | POA: Diagnosis not present

## 2019-05-18 LAB — CBC WITH DIFFERENTIAL/PLATELET
Abs Immature Granulocytes: 0.03 10*3/uL (ref 0.00–0.07)
Basophils Absolute: 0 10*3/uL (ref 0.0–0.1)
Basophils Relative: 1 %
Eosinophils Absolute: 0.1 10*3/uL (ref 0.0–0.5)
Eosinophils Relative: 2 %
HCT: 40.4 % (ref 39.0–52.0)
Hemoglobin: 13.2 g/dL (ref 13.0–17.0)
Immature Granulocytes: 0 %
Lymphocytes Relative: 24 %
Lymphs Abs: 2.1 10*3/uL (ref 0.7–4.0)
MCH: 30.6 pg (ref 26.0–34.0)
MCHC: 32.7 g/dL (ref 30.0–36.0)
MCV: 93.7 fL (ref 80.0–100.0)
Monocytes Absolute: 0.6 10*3/uL (ref 0.1–1.0)
Monocytes Relative: 6 %
Neutro Abs: 5.7 10*3/uL (ref 1.7–7.7)
Neutrophils Relative %: 67 %
Platelets: 119 10*3/uL — ABNORMAL LOW (ref 150–400)
RBC: 4.31 MIL/uL (ref 4.22–5.81)
RDW: 12.8 % (ref 11.5–15.5)
WBC: 8.5 10*3/uL (ref 4.0–10.5)
nRBC: 0 % (ref 0.0–0.2)

## 2019-05-18 LAB — IRON AND TIBC
Iron: 54 ug/dL (ref 45–182)
Saturation Ratios: 13 % — ABNORMAL LOW (ref 17.9–39.5)
TIBC: 409 ug/dL (ref 250–450)
UIBC: 355 ug/dL

## 2019-05-18 LAB — COMPREHENSIVE METABOLIC PANEL
ALT: 31 U/L (ref 0–44)
AST: 22 U/L (ref 15–41)
Albumin: 4 g/dL (ref 3.5–5.0)
Alkaline Phosphatase: 54 U/L (ref 38–126)
Anion gap: 9 (ref 5–15)
BUN: 19 mg/dL (ref 8–23)
CO2: 26 mmol/L (ref 22–32)
Calcium: 9.8 mg/dL (ref 8.9–10.3)
Chloride: 107 mmol/L (ref 98–111)
Creatinine, Ser: 2.39 mg/dL — ABNORMAL HIGH (ref 0.61–1.24)
GFR calc Af Amer: 31 mL/min — ABNORMAL LOW (ref >60)
GFR calc non Af Amer: 27 mL/min — ABNORMAL LOW (ref >60)
Glucose, Bld: 155 mg/dL — ABNORMAL HIGH (ref 70–99)
Potassium: 4.6 mmol/L (ref 3.5–5.1)
Sodium: 142 mmol/L (ref 135–145)
Total Bilirubin: 0.9 mg/dL (ref 0.3–1.2)
Total Protein: 7.3 g/dL (ref 6.5–8.1)

## 2019-05-18 LAB — FERRITIN: Ferritin: 20 ng/mL — ABNORMAL LOW (ref 24–336)

## 2019-05-19 ENCOUNTER — Other Ambulatory Visit: Payer: Self-pay | Admitting: *Deleted

## 2019-05-19 ENCOUNTER — Inpatient Hospital Stay (HOSPITAL_BASED_OUTPATIENT_CLINIC_OR_DEPARTMENT_OTHER): Payer: Medicare Other | Admitting: Internal Medicine

## 2019-05-19 DIAGNOSIS — N1832 Chronic kidney disease, stage 3b: Secondary | ICD-10-CM | POA: Diagnosis not present

## 2019-05-19 DIAGNOSIS — D631 Anemia in chronic kidney disease: Secondary | ICD-10-CM | POA: Diagnosis not present

## 2019-05-19 NOTE — Progress Notes (Signed)
I connected with Jared Walker on 05/19/19 at  3:00 PM EST by video enabled telemedicine visit and verified that I am speaking with the correct person using two identifiers.  I discussed the limitations, risks, security and privacy concerns of performing an evaluation and management service by telemedicine and the availability of in-person appointments. I also discussed with the patient that there may be a patient responsible charge related to this service. The patient expressed understanding and agreed to proceed.    Other persons participating in the visit and their role in the encounter: RN/medical reconciliation Patient's location: home Provider's location: office  Oncology History   No history exists.     Chief Complaint:anemia    History of present illness:Jared Walker 68 y.o.  male with history of chronic kidney disease; not on dialysis; and history of HIV-well-controlled; also history of iron deficiency is here for follow-up.  Patient denies any blood in stools or black or stools but denies any nausea vomiting.  Patient stopped taking iron pills approximately a month ago as he ran out.  Otherwise denies any unusual fatigue.  Any swelling in the legs.  He has recently followed up with ID; awaiting labs.  Patient currently awaiting evaluation with with nephrology.  Recently had labs drawn.  Observation/objective: GFR 31 creatinine 2.39; platelets 119.  Hemoglobin 13.2.  Iron studies-13% ferritin 20.  Assessment and plan: Anemia due to stage 3b chronic kidney disease #Iron deficient anemia secondary to CKD stage III-on p.o. iron twice a day; ran out appx 1 month ago.   # Today hemoglobin 13.2;  Iron saturation 13%%; ferritin-20. recommend compliance  p.o. iron.   # CKD stage III-GFR- 31 [Dr.Hladik; [facility] nephrology]; recommend increased fluid intake/follow up with nephrology.   #HIV/Hep C [s/p anti-viral]-under good control as per patient; on retrovirals. Stable;  awaiting work-up with ID doctor.  # Intermittent mild thrombocytopenia platelets 119- ? ITP stable.  Discussed the potential signs and symptoms of easy bruising and bleeding.  Monitor for now.  Patient will inform us if he notices any abnormal signs and symptoms.  # DISPOSITION: # Recommend follow-up in 6 months -MD/labs-cbc/cmp/iron studoes/ferritin-Dr.B  Follow-up instructions:  I discussed the assessment and treatment plan with the patient.  The patient was provided an opportunity to ask questions and all were answered.  The patient agreed with the plan and demonstrated understanding of instructions.  The patient was advised to call back or seek an in person evaluation if the symptoms worsen or if the condition fails to improve as anticipated.  Dr. Charlaine Dalton [place] at [facility] 05/19/2019 3:15 PM

## 2019-05-19 NOTE — Assessment & Plan Note (Addendum)
#  Iron deficient anemia secondary to CKD stage III-on p.o. iron twice a day; ran out appx 1 month ago.   # Today hemoglobin 13.2;  Iron saturation 13%%; ferritin-20. recommend compliance  p.o. iron.   # CKD stage III-GFR- 31 [Dr.Hladik; [facility] nephrology]; recommend increased fluid intake/follow up with nephrology.   #HIV/Hep C [s/p anti-viral]-under good control as per patient; on retrovirals. Stable; awaiting work-up with ID doctor.  # Intermittent mild thrombocytopenia platelets 119- ? ITP stable.  Discussed the potential signs and symptoms of easy bruising and bleeding.  Monitor for now.  Patient will inform us if he notices any abnormal signs and symptoms.  # DISPOSITION: # Recommend follow-up in 6 months -MD/labs-cbc/cmp/iron studoes/ferritin-Dr.B

## 2019-06-05 DIAGNOSIS — N183 Chronic kidney disease, stage 3 (moderate): Principal | ICD-10-CM

## 2019-06-27 DIAGNOSIS — B20 Human immunodeficiency virus [HIV] disease: Principal | ICD-10-CM

## 2019-06-27 MED ORDER — EMTRICITABINE 200 MG-TENOFOVIR ALAFENAMIDE FUMARATE 25 MG TABLET
ORAL_TABLET | ORAL | 11 refills | 0 days | Status: CP
Start: 2019-06-27 — End: 2020-06-26

## 2019-06-27 MED ORDER — DOLUTEGRAVIR 50 MG TABLET
ORAL | 11 refills | 0 days | Status: CP
Start: 2019-06-27 — End: 2020-06-26
  Filled 2019-06-29: qty 30, 30d supply, fill #0

## 2019-06-28 DIAGNOSIS — B20 Human immunodeficiency virus [HIV] disease: Principal | ICD-10-CM

## 2019-06-28 NOTE — Unmapped (Signed)
Per test claim for Descovy and Tivicay at the [facility], patient needs Medication Assistance Program for High Copay.

## 2019-06-29 MED FILL — DESCOVY 200 MG-25 MG TABLET: ORAL | 30 days supply | Qty: 30 | Fill #0

## 2019-06-29 MED FILL — TIVICAY 50 MG TABLET: 30 days supply | Qty: 30 | Fill #0 | Status: AC

## 2019-06-29 MED FILL — DESCOVY 200 MG-25 MG TABLET: 30 days supply | Qty: 30 | Fill #0 | Status: AC

## 2019-06-29 NOTE — Unmapped (Signed)
[facility]   Patient Onboarding/Medication Counseling    Samuel [name] is a 69 y.o. male with HIV who I am counseling today on continuation of therapy.  I am speaking to the patient.    Was a Nurse, learning disability used for this call? No    Verified patient's date of birth / HIPAA.    Specialty medication(s) to be sent: Infectious Disease: Descovy and Tivicay      Non-specialty medications/supplies to be sent: n/a      Medications not needed at this time: n/a         1. Tivicay (dolutegravir)    The patient declined counseling on medication administration, missed dose instructions, goals of therapy, side effects and monitoring parameters, warnings and precautions, drug/food interactions and storage, handling precautions, and disposal because they have taken the medication previously. The information in the declined sections below are for informational purposes only and was not discussed with patient.         Medication & Administration     Dosage: Take one tablet (50mg ) by mouth once daily    Administration: Take with or without food    Adherence/Missed dose instructions: take missed dose as soon as you remember. If it is close to the time of your next dose, skip the dose and resume with your next scheduled dose.    Goals of Therapy     to keep HIV levels at a non-detectable level on lab tests.    Side Effects & Monitoring Parameters     Common Side Effects:     ? Headache  ? Feeling tired or weak  ? Trouble sleeping      The following side effects should be reported to the provider:  ? Signs of an allergic reaction, like rash; hives; itching; red, swollen, blistered, or peeling skin with or without fever; wheezing; tightness in the chest or throat; trouble breathing, swallowing, or talking; unusual hoarseness; or swelling of the mouth, face, lips, tongue, or throat  ? Signs of liver problems like dark urine, feeling tired, not hungry, upset stomach or stomach pain, light-colored stools, throwing up, or yellow skin or eyes  ? Fever  ? Muscle or joint pain  ? Mouth sores  ? Eye irritation.   ? Shortness of breath  ? Feeling very tired or weak  ? Signs of infection like fever, sore throat, weakness, cough, or shortness of breath.      Contraindications, Warnings, & Precautions     ? Hepatotoxicity  ? Hypersensitivity reactions: Rash, constitutional findings, and organ dysfunction (eg, liver injury) have been reported.  ? Immune reconstitution syndrome: Patients may develop immune reconstitution syndrome resulting in the occurrence of an inflammatory response to an indolent or residual opportunistic infection during initial HIV treatment or activation of autoimmune disorders (eg, Graves disease, polymyositis, Guillain-Barr?? syndrome) later in therapy  ? Use caution in patients with renal or hepatic impairment    Drug/Food Interactions     ? Medication list reviewed in Epic. The patient was instructed to inform the care team before taking any new medications or supplements. patient reminded about calcium interaction and Tivicay and knows how to properly take them together.   ? Tivicay should be taken 2 hours before or 6 hours after taking cation-containing antacids or laxatives, sucralfate, oral supplements containing iron or calcium, or buffered medications.  ? Tivicay and supplements containing calcium or iron can be taken together with food.    Storage, Handling Precautions, & Disposal     ?  Store at room temperature in a dry place. Do not store in a bathroom.  ? Keep lid tightly closed.  ? Keep all drugs in a safe place  ? Keep all drugs out of the reach of children and pets.   ? Throw away unused or expired drugs. Do not flush down a toilet or pour down a drain unless you are told to do so. Check with your pharmacist if you have questions about the best way to throw out drugs. There may be drug take-back programs in your area.      2. Descovy (emtricitabine and tenofovir alafenamide)    The patient declined counseling on medication administration, missed dose instructions, goals of therapy, side effects and monitoring parameters, warnings and precautions, drug/food interactions and storage, handling precautions, and disposal because they have taken the medication previously. The information in the declined sections below are for informational purposes only and was not discussed with patient.     Medication & Administration     Dosage: Take 1 tablet by mouth daily    Administration: Take with or without food    Adherence/Missed dose instructions: take missed dose as soon as you remember. If it is close to the time of your next dose, skip the dose and resume with your next scheduled dose.    Goals of Therapy     ? Keep HIV levels non-detectable on lab tests    Side Effects & Monitoring Parameters   Common Side Effects:    ? Upset stomach  ? Diarrhea    The following side effects should be reported to the provider:    ?? Signs of an allergic reaction, like rash; hives; itching; red, swollen, blistered, or peeling skin with or without fever; wheezing; tightness in the chest or throat; trouble breathing, swallowing, or talking; unusual hoarseness; or swelling of the mouth, face, lips, tongue, or throat.   ?? Signs of kidney problems like unable to pass urine, change in how much urine is passed, blood in the urine, or a big weight gain.  ?? Signs of liver problems like dark urine, feeling tired, not hungry, upset stomach or stomach pain, light-colored stools, throwing up, or yellow skin or eyes.   ?? Signs of too much lactic acid in the blood (lactic acidosis) like fast breathing, fast heartbeat, a heartbeat that does not feel normal, very bad upset stomach or throwing up, feeling very sleepy, shortness of breath, feeling very tired or weak, very bad dizziness, feeling cold, or muscle pain or cramps  ?? Weight gain    Monitoring Parameters:     - Serum creatinine  - Urine glucose  - Urine protein (prior to or when initiating therapy and as clinically indicated during therapy);  - Serum phosphorus (in patients with chronic kidney disease)  - Hepatic function tests  - Testing for hepatitis B virus (HBV) is recommended prior to or when initiating antiretroviral therapy.  - Patients with HIV and HBV coinfection should be monitored for several months following therapy discontinuation.  - CD4 count  - HIV RNA plasma levels     Contraindications, Warnings, & Precautions     ?? Signs and symptoms of immune reconstitution syndrome  ?? Signs and symptoms of lactic acidosis  ?? Hepatomegaly  ?? Steatosis  ?? Renal toxicity    Drug/Food Interactions     ? Medication list reviewed in Epic. The patient was instructed to inform the care team before taking any new medications or supplements. No drug interactions  identified.     Storage, Handling Precautions, & Disposal     ?? Store this medication at room temperature.   ?? Store in the original container   ?? Keep lid tightly closed.   ?? Store in a dry place. Do not store in a bathroom.   ?? Keep all drugs in a safe place. Keep all drugs out of the reach of children and pets.   ?? Throw away unused or expired drugs. Do not flush down a toilet or pour down a drain unless you are told to do so. Check with your pharmacist if you have questions about the best way to throw out drugs. There may be drug take-back programs in your area        Current Medications (including OTC/herbals), Comorbidities and Allergies     Current Outpatient Medications   Medication Sig Dispense Refill   ??? dicyclomine (BENTYL) 10 mg capsule Take 10 mg by mouth two (2) times a day as needed.      ??? meclizine (ANTIVERT) 25 mg tablet Take 25 mg by mouth Three (3) times a day as needed.      ??? nystatin (MYCOSTATIN) 100,000 unit/mL suspension Take 5 mL by mouth daily.      ??? albuterol (PROVENTIL HFA;VENTOLIN HFA) 90 mcg/actuation inhaler Inhale 2 puffs.     ??? allopurinol (ZYLOPRIM) 100 MG tablet Take 2 tablets (200 mg total) by mouth daily. 180 tablet 3   ??? amLODIPine (NORVASC) 2.5 MG tablet Take 1 tablet (2.5 mg total) by mouth every morning. 90 tablet 3   ??? aspirin (ECOTRIN) 81 MG tablet   0   ??? atorvastatin (LIPITOR) 20 MG tablet TAKE 1 TABLET (20 MG) BY MOUTH DAILY 90 tablet 3   ??? baclofen (LIORESAL) 10 MG tablet Take 1 tablet (10 mg total) by mouth Three (3) times a day as needed for muscle spasms. 30 tablet 2   ??? calcium carbonate-vitamin D2 500 mg(1,250mg ) -200 unit tablet Take 1 tablet by mouth Two (2) times a day.     ??? carvediloL (COREG) 12.5 MG tablet Take 1 tablet (12.5 mg total) by mouth Two (2) times a day. 180 tablet 3   ??? cefuroxime (CEFTIN) 250 MG tablet   0   ??? cloNIDine HCL (CATAPRES) 0.1 MG tablet Take 1 tablet (0.1 mg total) by mouth 3 (three) times a day. 270 tablet 3   ??? cranberry 400 mg cap Take 400 mg by mouth daily.     ??? cyanocobalamin 1000 MCG tablet Take 1,000 mcg by mouth.     ??? diazepam (VALIUM) 5 MG tablet Take 5 mg by mouth nightly.      ??? docusate sodium (COLACE) 100 MG capsule Take 1 capsule (100 mg total) by mouth every twelve (12) hours. 60 capsule 0   ??? dolutegravir (TIVICAY) 50 mg Tab TABLET Take 1 tablet by mouth daily 30 tablet 11   ??? donepeziL (ARICEPT) 10 MG tablet Take 10 mg by mouth.     ??? emtricitabine-tenofovir alafen (DESCOVY) 200-25 mg tablet Take 1 tablet by mouth daily 30 tablet 11   ??? ferrous sulfate 325 (65 FE) MG tablet Take 325 mg by mouth daily with breakfast.      ??? folic acid (FOLVITE) 1 MG tablet   0   ??? glimepiride (AMARYL) 1 MG tablet take 2 tablet by mouth once daily with BREAKFAST  0   ??? HYDROcodone-acetaminophen (NORCO) 5-325 mg per tablet Directions are take one tablet 30 minutes  before physical therapy, then take one tablet at bedtime as needed for pain     ??? lidocaine (XYLOCAINE) 5 % ointment Apply 1 application topically daily as needed.     ??? losartan (COZAAR) 100 MG tablet Take 1 tablet (100 mg total) by mouth daily. 90 tablet 3   ??? nitroglycerin (NITROSTAT) 0.4 MG SL tablet DISSOLVE ONE TABLET UNDER TONGUE AS NEEDED FOR CHEST PAIN EVERY 5 MINUTES AS DIRECTED 25 tablet 0   ??? omega-3 fatty acids-fish oil 340-1,000 mg capsule Take 1 capsule by mouth.     ??? ondansetron (ZOFRAN) 4 MG tablet take 1 tablet by mouth every 8 hours if needed for nausea     ??? oxyCODONE-acetaminophen (PERCOCET) 5-325 mg per tablet Take 1 tablet by mouth.     ??? predniSONE (DELTASONE) 10 MG tablet Take one tablet daily by mouth for the next five days.     ??? ROCKLATAN 0.02-0.005 % Drop INT 1 GTT INTO OU QHS     ??? sertraline (ZOLOFT) 50 MG tablet take 1 tablet by mouth once daily     ??? spironolactone (ALDACTONE) 25 MG tablet Take 1 tablet (25 mg total) by mouth daily. 90 tablet 3   ??? tamsulosin (FLOMAX) 0.4 mg capsule TAKE 1 CAPSULE(0.4 MG) BY MOUTH DAILY 90 capsule 3   ??? torsemide (DEMADEX) 20 MG tablet Take 2 tablets (40 mg total) by mouth daily. 90 tablet 3     No current facility-administered medications for this visit.        Allergies   Allergen Reactions   ??? Colchicine Analogues Diarrhea     Have diarrhea when taken for long periods of time   ??? Tramadol Other (See Comments)     unknown tolerates morphine       Patient Active Problem List   Diagnosis   ??? Human immunodeficiency virus (HIV) disease (CMS-HCC)   ??? HCV (hepatitis C virus)   ??? Hypertension   ??? Gout   ??? Biological false positive RPR test   ??? Chronic kidney disease   ??? Renal mass   ??? CKD (chronic kidney disease) stage 3, GFR 30-59 ml/min   ??? NSTEMI (non-ST elevated myocardial infarction) (CMS-HCC)   ??? Neurological deficit present   ??? HIV (human immunodeficiency virus infection) (CMS-HCC)   ??? Cirrhosis (CMS-HCC)   ??? Abnormal EKG   ??? Acute encephalopathy   ??? Acute on chronic diastolic CHF (congestive heart failure) (CMS-HCC)   ??? Anemia   ??? Bradycardia   ??? CAD in native artery   ??? Chest pain   ??? Chronic hepatitis C without hepatic coma (CMS-HCC)       Reviewed and up to date in Epic.    Appropriateness of Therapy     Are the medications and dosages appropriate based on diagnosis? Yes    Prescription has been clinically reviewed: Yes    Baseline Quality of Life Assessment      How many days over the past month did your HIV keep you from your normal activities? 0    Financial Information     Medication Assistance provided: [employer]    Anticipated copay of $0.00 for both Tivicay and Descovy reviewed with patient. Verified delivery address.    Delivery Information     Scheduled delivery date: 06/29/2019    Expected start date: continuation of existing medications    Medication will be delivered via Same Day Courier to the prescription address in [employer] [facility].  This shipment will not  require a signature.      Explained the services we provide at [facility] and that each month we would call to set up refills.  Stressed importance of returning phone calls so that we could ensure they receive their medications in time each month.  Informed patient that we should be setting up refills 7-10 days prior to when they will run out of medication.  A pharmacist will reach out to perform a clinical assessment periodically.  Informed patient that a welcome packet and a drug information handout will be sent.      Patient verbalized understanding of the above information as well as how to contact the pharmacy at [phone] option 4 with any questions/concerns.  The pharmacy is open Monday through Friday 8:30am-4:30pm.  A pharmacist is available 24/7 via pager to answer any clinical questions they may have.    Patient Specific Needs     ? Does the patient have any physical, cognitive, or cultural barriers? No    ? Patient prefers to have medications discussed with  Patient     ? Is the patient or caregiver able to read and understand education materials at a high school level or above? Yes    ? Patient's primary language is  English     ? Is the patient high risk? No     ? Does the patient require a Care Management Plan? No     ? Does the patient require physician intervention or other additional services (i.e. nutrition, smoking cessation, social work)? No      Roderic Palau  [facility] Specialty Pharmacist

## 2019-07-03 DIAGNOSIS — N183 CKD (chronic kidney disease) stage 3, GFR 30-59 ml/min: Principal | ICD-10-CM

## 2019-07-18 MED ORDER — PROCYSBI 300 MG ORAL DR GRANULES IN PACKET
0.00000 days
Start: 2019-07-18 — End: 2020-07-17

## 2019-07-21 NOTE — Unmapped (Signed)
[facility] Refill Coordination Note    Specialty Medication(s) to be Shipped:   Infectious Disease: Descovy and Crista Curb, DOB: May 07, 1951  Phone: [phone] (home)     All above HIPAA information was verified with patient.     Was a Nurse, learning disability used for this call? No    Completed refill call assessment today to schedule patient's medication shipment from the [facility] [phone]).       Specialty medication(s) and dose(s) confirmed: Regimen is correct and unchanged.   Changes to medications: Herman reports no changes at this time.  Changes to insurance: No  Questions for the pharmacist: No    Confirmed patient received Welcome Packet with first shipment. The patient will receive a drug information handout for each medication shipped and additional FDA Medication Guides as required.       DISEASE/MEDICATION-SPECIFIC INFORMATION        N/A    SPECIALTY MEDICATION ADHERENCE     Medication Adherence    Patient reported X missed doses in the last month: 0  Specialty Medication: tivicay 50mg   Patient is on additional specialty medications: Yes  Additional Specialty Medications: descovy 200-25mg   Patient Reported Additional Medication X Missed Doses in the Last Month: 0  Patient is on more than two specialty medications: No  Informant: patient  Reliability of informant: reliable        Desovy 200-25 mg: 7 days of medicine on hand   Tivicay 50 mg: 7 days of medicine on hand     SHIPPING     Shipping address confirmed in Epic.     Delivery Scheduled: Yes, Expected medication delivery date: 07/26/2019.     Medication will be delivered via UPS to the prescription address in Epic [facility].    Keymora Grillot P [facility] Specialty Technician

## 2019-07-24 DIAGNOSIS — K7469 Other cirrhosis of liver: Principal | ICD-10-CM

## 2019-07-24 DIAGNOSIS — N183 Chronic kidney disease, stage 3 (moderate): Principal | ICD-10-CM

## 2019-07-24 DIAGNOSIS — B20 Human immunodeficiency virus [HIV] disease: Principal | ICD-10-CM

## 2019-07-25 MED FILL — TIVICAY 50 MG TABLET: ORAL | 30 days supply | Qty: 30 | Fill #1

## 2019-07-25 MED FILL — TIVICAY 50 MG TABLET: 30 days supply | Qty: 30 | Fill #1 | Status: AC

## 2019-07-25 MED FILL — DESCOVY 200 MG-25 MG TABLET: 30 days supply | Qty: 30 | Fill #1 | Status: AC

## 2019-07-25 MED FILL — DESCOVY 200 MG-25 MG TABLET: ORAL | 30 days supply | Qty: 30 | Fill #1

## 2019-07-31 DIAGNOSIS — N183 CKD (chronic kidney disease) stage 3, GFR 30-59 ml/min: Principal | ICD-10-CM

## 2019-07-31 NOTE — Unmapped (Signed)
VIDEO ENCOUNTER 08/01/2019     LOCATION OF PATIENT:  Home, in [place], [place].    LOCATION OF PROVIDER: [facility], [address] [place], [place] [address]    HISTORY OF PRESENT ILLNESS:      [name] is a 69 year old man with stage G3a-b:A2 chronic kidney disease who is evaluated today via a video encounter to assess the status of his chronic kidney disease and volume status.  He indicates that his systolic blood pressure continues to run borderline elevated at about 140 mmHg.  He has minimal leg edema on torsemide 40 mg once daily.  He currently is using spironolactone only every other week.  He does not have increased abdominal distention, fever, dyspnea, chest pain, diarrhea, or dyspnea on exertion.  He will complete his second dose of vaccination against COVID-19 (Moderna) on 08/12/2019.    MEDICATIONS:    Medication Sig   ??? albuterol (PROVENTIL HFA;VENTOLIN HFA) 90 mcg/actuation inhaler Inhale 2 puffs.   ??? allopurinol (ZYLOPRIM) 100 MG tablet Take 2 tablets (200 mg total) by mouth daily.   ??? amLODIPine (NORVASC) 2.5 MG tablet Take 1 tablet (2.5 mg total) by mouth every morning.   ??? aspirin (ECOTRIN) 81 MG tablet    ??? atorvastatin (LIPITOR) 20 MG tablet TAKE 1 TABLET (20 MG) BY MOUTH DAILY   ??? baclofen (LIORESAL) 10 MG tablet Take 1 tablet (10 mg total) by mouth Three (3) times a day as needed for muscle spasms.   ??? calcium carbonate-vitamin D2 500 mg(1,250mg ) -200 unit tablet Take 1 tablet by mouth Two (2) times a day.   ??? carvediloL (COREG) 12.5 MG tablet Take 1 tablet (12.5 mg total) by mouth Two (2) times a day.   ??? cloNIDine HCL (CATAPRES) 0.1 MG tablet Take 1 tablet (0.1 mg total) by mouth 3 (three) times a day.   ??? cranberry 400 mg cap Take 400 mg by mouth daily.   ??? cyanocobalamin 1000 MCG tablet Take 1,000 mcg by mouth.   ??? diazepam (VALIUM) 5 MG tablet Take 5 mg by mouth nightly.    ??? dicyclomine (BENTYL) 10 mg capsule Take 10 mg by mouth two (2) times a day as needed.    ??? docusate sodium (COLACE) 100 MG capsule Take 1 capsule (100 mg total) by mouth every twelve (12) hours.   ??? dolutegravir (TIVICAY) 50 mg Tab TABLET Take 1 tablet by mouth daily   ??? donepeziL (ARICEPT) 10 MG tablet Take 10 mg by mouth.   ??? emtricitabine-tenofovir alafen (DESCOVY) 200-25 mg tablet Take 1 tablet by mouth daily   ??? ferrous sulfate 325 (65 FE) MG tablet Take 325 mg by mouth daily with breakfast.    ??? folic acid (FOLVITE) 1 MG tablet    ??? glimepiride (AMARYL) 1 MG tablet take 2 tablet by mouth once daily with BREAKFAST   ??? HYDROcodone-acetaminophen (NORCO) 5-325 mg per tablet Directions are take one tablet 30 minutes before physical therapy, then take one tablet at bedtime as needed for pain   ??? lidocaine (XYLOCAINE) 5 % ointment Apply 1 application topically daily as needed.   ??? losartan (COZAAR) 100 MG tablet Take 1 tablet (100 mg total) by mouth daily.   ??? meclizine (ANTIVERT) 25 mg tablet Take 25 mg by mouth Three (3) times a day as needed.    ??? nitroglycerin (NITROSTAT) 0.4 MG SL tablet DISSOLVE ONE TABLET UNDER TONGUE AS NEEDED FOR CHEST PAIN EVERY 5 MINUTES AS DIRECTED   ???  nystatin (MYCOSTATIN) 100,000 unit/mL suspension Take 5 mL by mouth daily.    ??? omega-3 fatty acids-fish oil 340-1,000 mg capsule Take 1 capsule by mouth.   ??? ondansetron (ZOFRAN) 4 MG tablet take 1 tablet by mouth every 8 hours if needed for nausea   ??? oxyCODONE-acetaminophen (PERCOCET) 5-325 mg per tablet Take 1 tablet by mouth.   ??? ROCKLATAN 0.02-0.005 % Drop INT 1 GTT INTO OU QHS   ??? sertraline (ZOLOFT) 50 MG tablet take 1 tablet by mouth once daily   ??? spironolactone (ALDACTONE) 25 MG tablet Take 1 tablet (25 mg total) by mouth daily.   ??? tamsulosin (FLOMAX) 0.4 mg capsule TAKE 1 CAPSULE(0.4 MG) BY MOUTH DAILY   ??? torsemide (DEMADEX) 20 MG tablet Take 2 tablets (40 mg total) by mouth daily.       REVIEW OF SYSTEMS:  Constitutional: No fever or chills.  Cardiovascular: No chest pain.  Gastrointestinal: No nausea, vomiting, or diarrhea.  Genitourinary: Nocturia x 1.  All other systems are reviewed and are negative except that noted in the history of present illness.    Limited physical examination conducted via video shows the following.    Constitutional:    He is in no acute distress.    VITAL SIGNS:  BP 142/90  - Pulse 76/min  - Temp Afebrile (Temporal)  - Wt 232 lb  -      Extremities: Trace pretibial leg edema bilaterally.    The remainder the examination was deferred secondary to the fact that this was a video visit.      LABORATORY STUDIES:    Renal Function Panel    Collection Time: 07/31/19  1:01 PM   Result Value Ref Range    Glucose 156 (H) 65 - 99 mg/dL    BUN 22 8 - 27 mg/dL    Creatinine 1.61 (H) 0.76 - 1.27 mg/dL    GFR MDRD Non Af Amer 28 (L) >59 mL/min/1.73    GFR MDRD Af Amer 33 (L) >59 mL/min/1.73    BUN/Creatinine Ratio 10 10 - 24    Sodium 143 134 - 144 mmol/L    Potassium 4.7 3.5 - 5.2 mmol/L    Chloride 101 96 - 106 mmol/L    CO2 21 20 - 29 mmol/L    Calcium 11.0 (H) 8.6 - 10.2 mg/dL    Phosphorus, Serum 3.3 2.8 - 4.1 mg/dL    Albumin 4.5 3.8 - 4.8 g/dL         Serum creatinine trend:    Date Value   07/31/2019 2.30 (H)   05/16/2019 2.33 (H)   05/15/2019 2.45 (H)   09/12/2018 1.76 (H)   08/17/2017 1.97 (H)   04/02/2017 2.03 (H)   02/23/2017 2.30 (H)       IMPRESSION AND PLAN:    1.  Stage G3a-b:A2 chronic kidney disease.  He has slow progression of chronic kidney disease.  The etiology is likely multifactorial with elements of nephrosclerosis related to hypertension as well as diabetes mellitus.  He now feels comfortable coming in for an in person visit about 6 weeks from now because of the near completion of vaccination against COVID-19 infection.    2.  Hypertension.  Home blood pressure readings indicate elevated systolic blood pressures.  He will return to see me in about 6 weeks to assess his in office blood pressure readings.  His antihypertensive regimen will be adjusted based on these readings.    3.  Human immunodeficiency virus disease.  He has ongoing follow-up in the infectious disease clinic.    4.  Lower urinary tract symptoms.  Stable on tamsulosin.    5.  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  I asked him to discontinue calcium carbonate and vitamin D supplementation.  His serum PTH will be assessed at his next clinic visit.    6.  Nephrolithiasis.  I have placed an order for a kidney ultrasound.    7.  Irritable bowel syndrome.  He will continue on dicyclomine.    8.  Follow up plans.  He will return to see me in about 6 weeks.      I spent 20 minutes on the real-time audio and video with the patient on the date of service. I spent an additional 20 minutes on pre- and post-visit activities.     The patient was physically located in [place] or a state in which I am permitted to provide care. The patient and/or parent/guardian understood that s/he may incur co-pays and cost sharing, and agreed to the telemedicine visit. The visit was reasonable and appropriate under the circumstances given the patient's presentation at the time.    The patient and/or parent/guardian has been advised of the potential risks and limitations of this mode of treatment (including, but not limited to, the absence of in-person examination) and has agreed to be treated using telemedicine. The patient's/patient's family's questions regarding telemedicine have been answered.     If the visit was completed in an ambulatory setting, the patient and/or parent/guardian has also been advised to contact their provider???s office for worsening conditions, and seek emergency medical treatment and/or call 911 if the patient deems either necessary.        Zetta Bills. Stefano Gaul, MD  Date of service 08/01/2019

## 2019-08-01 ENCOUNTER — Telehealth: Admit: 2019-08-01 | Discharge: 2019-08-02 | Payer: MEDICARE | Attending: Nephrology | Primary: Nephrology

## 2019-08-01 LAB — RENAL FUNCTION PANEL
ALBUMIN: 4.5 g/dL (ref 3.8–4.8)
BLOOD UREA NITROGEN: 22 mg/dL (ref 8–27)
BUN / CREAT RATIO: 10 (ref 10–24)
CALCIUM: 11 mg/dL — ABNORMAL HIGH (ref 8.6–10.2)
CHLORIDE: 101 mmol/L (ref 96–106)
CO2: 21 mmol/L (ref 20–29)
CREATININE: 2.3 mg/dL — ABNORMAL HIGH (ref 0.76–1.27)
GFR MDRD AF AMER: 33 mL/min/{1.73_m2} — ABNORMAL LOW
GFR MDRD NON AF AMER: 28 mL/min/{1.73_m2} — ABNORMAL LOW
POTASSIUM: 4.7 mmol/L (ref 3.5–5.2)
SODIUM: 143 mmol/L (ref 134–144)

## 2019-08-01 LAB — GFR MDRD NON AF AMER
Glomerular filtration rate/1.73 sq M.predicted.non black:ArVRat:Pt:Ser/Plas/Bld:Qn:Creatinine-based formula (CKD-EPI): 28 — ABNORMAL LOW

## 2019-08-01 NOTE — Unmapped (Signed)
1. Discontinue calcium carbonate with vitamin D    2. Return to see me for an in person visit on in 4-8 weeks ??? the clinic will notify you about the appointment.

## 2019-08-18 IMAGING — CR DG HIP (WITH OR WITHOUT PELVIS) 2-3V*L*
3 series · 3 of 3 positions shown · non-contrast
Comparison: 09/14/2012

CLINICAL DATA: Fall 4 months ago with persistent hip pain left
greater than right, initial encounter

EXAM:
DG HIP (WITH OR WITHOUT PELVIS) 3V LEFT

[pelvis ap]
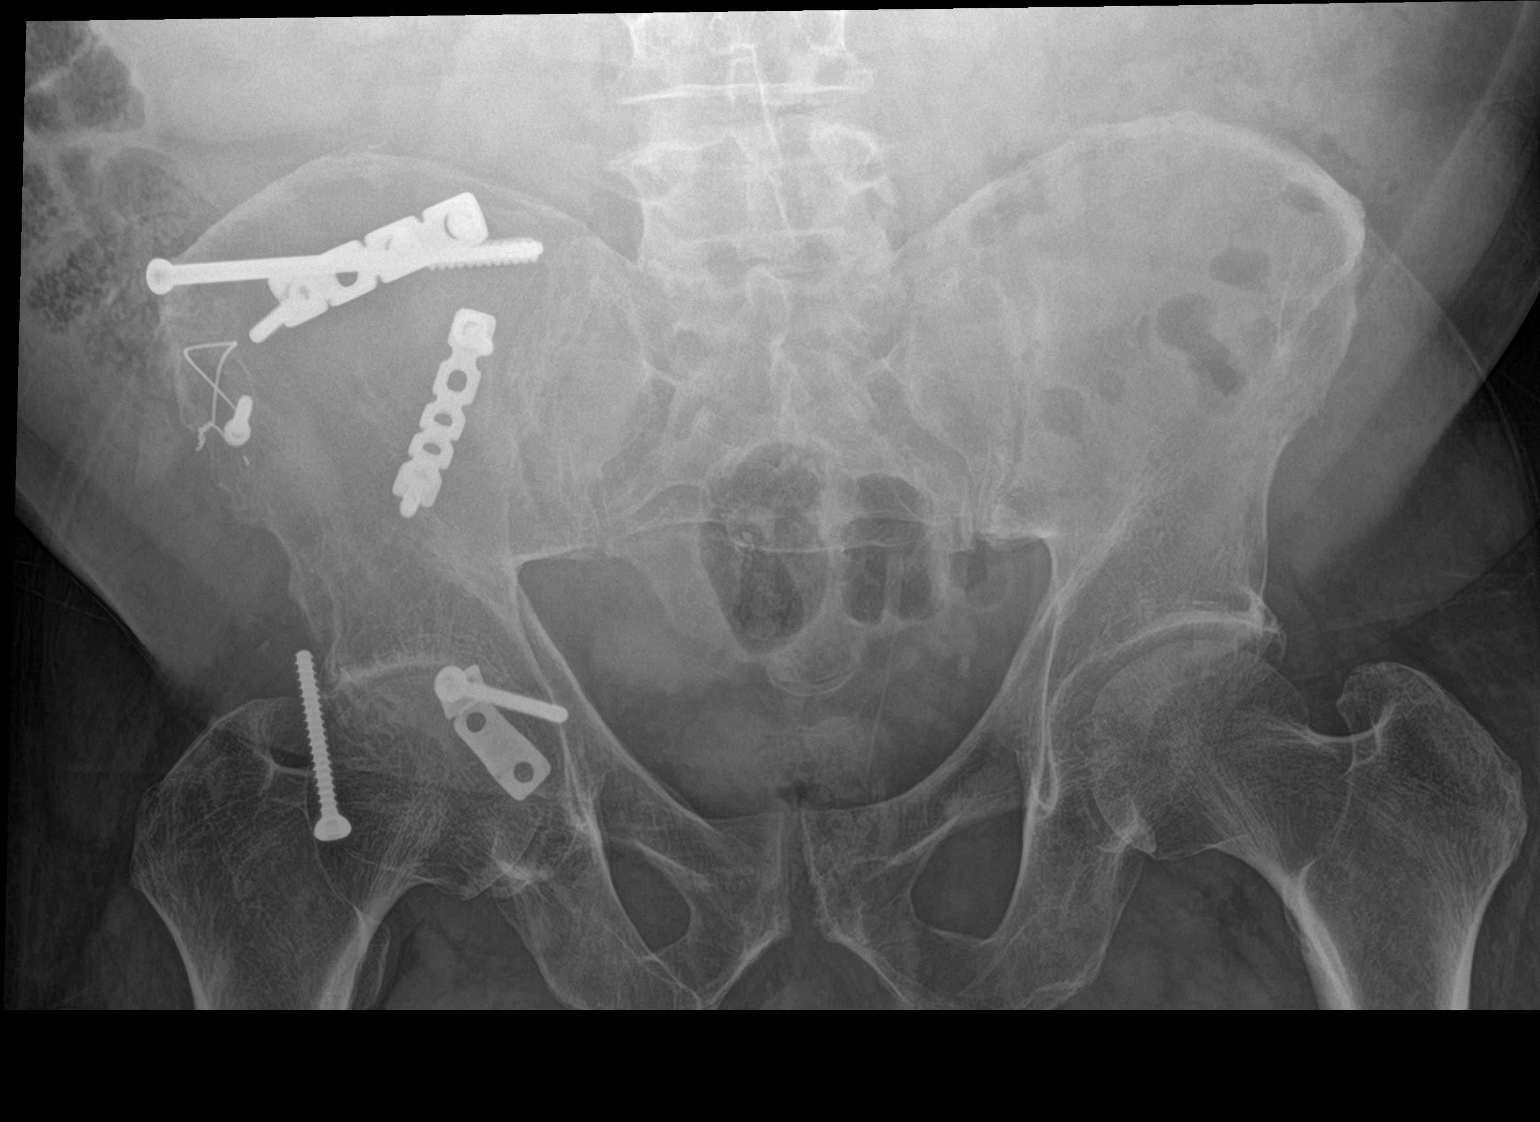

[hip ap]
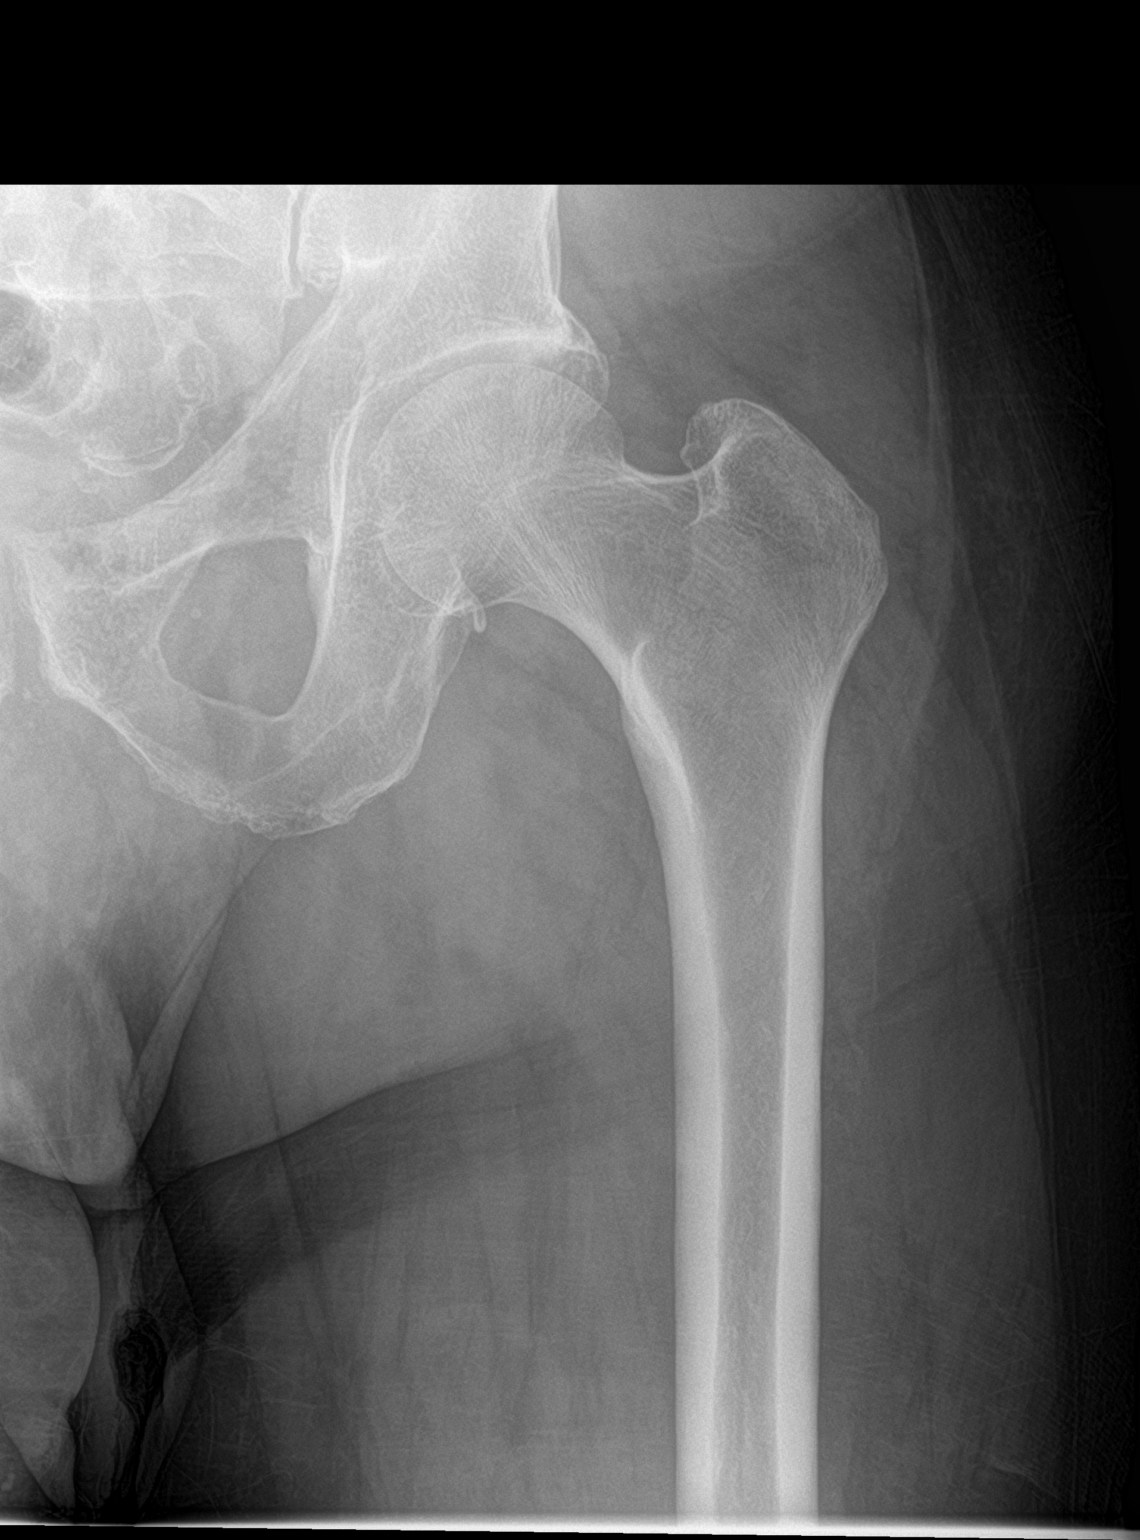

[hip lat]
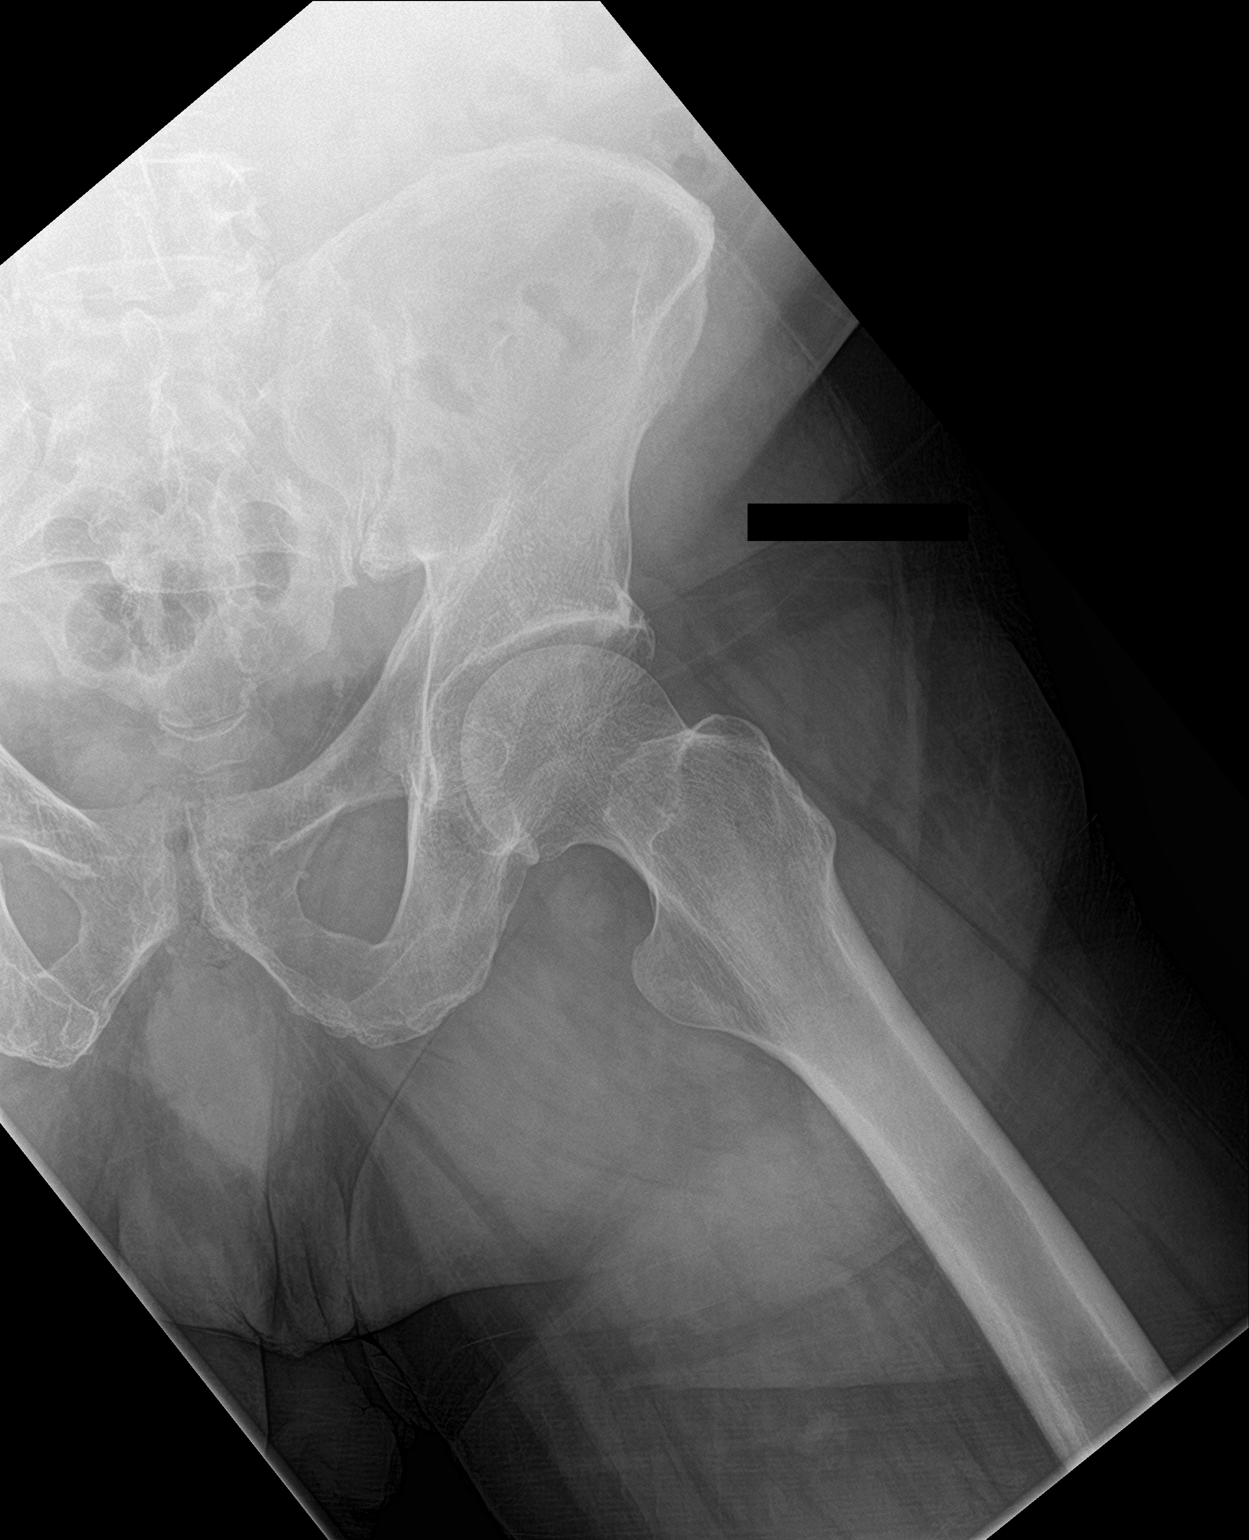

[3 of 3 positions shown; findings below may reference images not displayed]

FINDINGS: Postsurgical changes are noted in the right iliac bone. Degenerative
changes of the hip joints are noted bilaterally. No acute fracture
or dislocation is noted. No gross soft tissue abnormality is seen.
IMPRESSION: Degenerative change without acute abnormality.

## 2019-08-22 NOTE — Unmapped (Signed)
[facility] Refill Coordination Note    Specialty Medication(s) to be Shipped:   Infectious Disease: Descovy and Tivicay    Other medication(s) to be shipped: n/a     [name], DOB: December 05, 1950  Phone: [phone] (home)       All above HIPAA information was verified with patient's family member, Johnny Bridge.     Was a Nurse, learning disability used for this call? No    Completed refill call assessment today to schedule patient's medication shipment from the [facility] [phone]).       Specialty medication(s) and dose(s) confirmed: Regimen is correct and unchanged.   Changes to medications: Aakash reports no changes at this time.  Changes to insurance: No  Questions for the pharmacist: No    Confirmed patient received Welcome Packet with first shipment. The patient will receive a drug information handout for each medication shipped and additional FDA Medication Guides as required.       DISEASE/MEDICATION-SPECIFIC INFORMATION        N/A    SPECIALTY MEDICATION ADHERENCE     Medication Adherence    Patient reported X missed doses in the last month: 0  Specialty Medication: tivicay  Patient is on additional specialty medications: Yes  Additional Specialty Medications: descovy  Patient Reported Additional Medication X Missed Doses in the Last Month: 0  Informant: spouse                Descovy 200-25 mg: 7 days of medicine on hand   Tivicay 50 mg: 7 days of medicine on hand         SHIPPING     Shipping address confirmed in Epic.     Delivery Scheduled: Yes, Expected medication delivery date: 08/24/19.     Medication will be delivered via Next Day Courier to the prescription address in Epic WAM.    Jasper Loser   [facility] Specialty Technician

## 2019-08-23 MED FILL — DESCOVY 200 MG-25 MG TABLET: ORAL | 30 days supply | Qty: 30 | Fill #2

## 2019-08-23 MED FILL — TIVICAY 50 MG TABLET: ORAL | 30 days supply | Qty: 30 | Fill #2

## 2019-08-23 MED FILL — TIVICAY 50 MG TABLET: 30 days supply | Qty: 30 | Fill #2 | Status: AC

## 2019-08-23 MED FILL — DESCOVY 200 MG-25 MG TABLET: 30 days supply | Qty: 30 | Fill #2 | Status: AC

## 2019-08-28 DIAGNOSIS — N183 CKD (chronic kidney disease) stage 3, GFR 30-59 ml/min: Principal | ICD-10-CM

## 2019-09-14 NOTE — Unmapped (Signed)
Addended byJackelyn Poling on: 09/14/2019 09:47 AM     Modules accepted: Orders

## 2019-09-15 NOTE — Unmapped (Signed)
VIDEO ENCOUNTER 09/19/2019    LOCATION OF PATIENT: Home in [place], [place].    LOCATION OF PROVIDER: [facility], [address], [place], [place] [address]    HISTORY OF PRESENT ILLNESS:      Samuel [name] is a 69 year old man with stage G3a-b:A2 chronic kidney disease who is evaluated today for stage G3b:A2 chronic kidney disease.    His recent labs on 08/23/2018 show a stable serum creatinine level at 2.0 mg/dL.  Dr. Marcello Fennel has been gradually titrating up his antihypertensive regimen.  Most recently, his systolic blood pressures have averaged about 135 to 140 mmHg.  His glycemic control has been suboptimal and sitagliptin was recently added at 50 mg once daily in addition to glimepiride 4 mg once daily.  Indicates that his leg edema is well controlled on torsemide 40 mg once daily.    MEDICATIONS:      Medication Sig   ??? albuterol (PROVENTIL HFA;VENTOLIN HFA) 90 mcg/actuation inhaler Inhale 2 puffs.   ??? allopurinol (ZYLOPRIM) 100 MG tablet Take 2 tablets (200 mg total) by mouth daily.   ??? amLODIPine (NORVASC) 2.5 MG tablet Take 3 tablets (7.5 mg total) by mouth every morning.   ??? aspirin (ECOTRIN) 81 MG tablet    ??? atenoloL (TENORMIN) 100 MG tablet Take 1 tablet (100 mg total) by mouth daily.   ??? atorvastatin (LIPITOR) 20 MG tablet TAKE 1 TABLET (20 MG) BY MOUTH DAILY   ??? baclofen (LIORESAL) 10 MG tablet Take 1 tablet (10 mg total) by mouth Three (3) times a day as needed for muscle spasms.   ??? calcium carbonate-vitamin D2 500 mg(1,250mg ) -200 unit tablet Take 1 tablet by mouth Two (2) times a day.   ??? carvediloL (COREG) 12.5 MG tablet Take 1 tablet (12.5 mg total) by mouth Two (2) times a day.   ??? cloNIDine HCL (CATAPRES) 0.1 MG tablet Take 1 tablet (0.1 mg total) by mouth 3 (three) times a day.   ??? cranberry 400 mg cap Take 400 mg by mouth daily.   ??? cyanocobalamin 1000 MCG tablet Take 1,000 mcg by mouth.   ??? diazepam (VALIUM) 5 MG tablet Take 5 mg by mouth nightly.    ??? dicyclomine (BENTYL) 10 mg capsule Take 10 mg by mouth two (2) times a day as needed.    ??? docusate sodium (COLACE) 100 MG capsule Take 1 capsule (100 mg total) by mouth every twelve (12) hours.   ??? dolutegravir (TIVICAY) 50 mg Tab TABLET Take 1 tablet by mouth daily   ??? donepeziL (ARICEPT) 10 MG tablet Take 10 mg by mouth.   ??? doxazosin (CARDURA) 4 MG tablet Take 1 tablet (4 mg total) by mouth nightly.   ??? emtricitabine-tenofovir alafen (DESCOVY) 200-25 mg tablet Take 1 tablet by mouth daily   ??? ferrous sulfate 325 (65 FE) MG tablet Take 325 mg by mouth daily with breakfast.    ??? folic acid (FOLVITE) 1 MG tablet    ??? glimepiride (AMARYL) 4 MG tablet Take 1 tablet (4 mg total) by mouth daily.   ??? hydrALAZINE (APRESOLINE) 100 MG tablet Take 1 tablet (100 mg total) by mouth Two (2) times a day.   ??? HYDROcodone-acetaminophen (NORCO) 5-325 mg per tablet Directions are take one tablet 30 minutes before physical therapy, then take one tablet at bedtime as needed for pain   ??? lidocaine (XYLOCAINE) 5 % ointment Apply 1 application topically daily as needed.   ??? lisinopriL (PRINIVIL,ZESTRIL) 40 MG  tablet Take 1 tablet (40 mg total) by mouth daily.   ??? meclizine (ANTIVERT) 25 mg tablet Take 25 mg by mouth Three (3) times a day as needed.    ??? nitroglycerin (NITROSTAT) 0.4 MG SL tablet DISSOLVE ONE TABLET UNDER TONGUE AS NEEDED FOR CHEST PAIN EVERY 5 MINUTES AS DIRECTED   ??? nystatin (MYCOSTATIN) 100,000 unit/mL suspension Take 5 mL by mouth daily.    ??? omega-3 fatty acids-fish oil 340-1,000 mg capsule Take 1 capsule by mouth.   ??? ondansetron (ZOFRAN) 4 MG tablet take 1 tablet by mouth every 8 hours if needed for nausea   ??? oxyCODONE-acetaminophen (PERCOCET) 5-325 mg per tablet Take 1 tablet by mouth.   ??? ROCKLATAN 0.02-0.005 % Drop INT 1 GTT INTO OU QHS   ??? sertraline (ZOLOFT) 50 MG tablet take 1 tablet by mouth once daily   ??? SITagliptin (JANUVIA) 50 MG tablet Take 1 tablet (50 mg total) by mouth daily.   ??? spironolactone (ALDACTONE) 25 MG tablet Take 1 tablet (25 mg total) by mouth daily.   ??? tamsulosin (FLOMAX) 0.4 mg capsule TAKE 1 CAPSULE(0.4 MG) BY MOUTH DAILY   ??? torsemide (DEMADEX) 20 MG tablet Take 2 tablets (40 mg total) by mouth daily.       REVIEW OF SYSTEMS:  General: No fever or chills.  Cardiovascular: No chest pain.  All other systems are reviewed and are negative except that noted in the history of present illness.    PHYSICAL EXAMINATION:    GENERAL:   The patient is seen through the video visit and is in no acute distress.    The remainder the examination was deferred secondary to the fact that this was a video visit.    LABORATORY STUDIES (08/23/2019):     White count 10.2, hemoglobin 14.7, platelet count 162, sodium 137, potassium 4.7, chloride 101, total CO2 28, BUN 21, creatinine 2.1, EGFR 38, glucose 266, calcium 10.9, AST 39, ALT 61, alkaline phosphatase 83, albumin 4.7, bilirubin 0.7, total protein 8.0, hemoglobin A1c 9.3, urine albumin to creatinine ratio to 45 mg/g, and TSH 1.004.      IMPRESSION AND PLAN:    1.  Stage G3a-b:A2 chronic kidney disease.  His kidney function has been relatively stable over the most recent interval.  Dr. Marcello Fennel today is appropriately uptitrating his antihypertensive medications and is working on glycemic control.  Indicates that his volume status is acceptable.  ??  2.    Resistant hypertension.    He will continue on his regimen that includes amlodipine 7.5 mg once daily, atenolol 100 mg once daily, doxazosin 4 mg daily, lisinopril 40 mg once daily, and hydralazine 100 mg twice daily.    3.  Human immunodeficiency virus disease.    He will follow up with Dr. Merrily Pew.    4.  Lower urinary tract symptoms.    Continue tamsulosin.    5.  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    ??  6.  Nephrolithiasis.    He is asymptomatic.  ??  7.  Irritable bowel syndrome.  Continue on dicyclomine.  ??  8. Follow up plans.  He will return to see me in about 3 months.      Zetta Bills. Stefano Gaul, MD  Date of service 09/19/2019

## 2019-09-19 ENCOUNTER — Telehealth: Admit: 2019-09-19 | Discharge: 2019-09-20 | Payer: MEDICARE | Attending: Nephrology | Primary: Nephrology

## 2019-09-19 DIAGNOSIS — N2889 Other specified disorders of kidney and ureter: Principal | ICD-10-CM

## 2019-09-19 DIAGNOSIS — I151 Hypertension secondary to other renal disorders: Principal | ICD-10-CM

## 2019-09-19 MED ORDER — AMLODIPINE 2.5 MG TABLET
ORAL_TABLET | Freq: Every morning | ORAL | 3 refills | 30 days
Start: 2019-09-19 — End: ?

## 2019-09-19 MED ORDER — GLIMEPIRIDE 4 MG TABLET
ORAL_TABLET | Freq: Every day | ORAL | 3 refills | 90.00000 days
Start: 2019-09-19 — End: 2020-09-18

## 2019-09-19 MED ORDER — ATENOLOL 100 MG TABLET
ORAL_TABLET | Freq: Every day | ORAL | 3 refills | 90 days
Start: 2019-09-19 — End: 2020-09-18

## 2019-09-19 MED ORDER — HYDRALAZINE 100 MG TABLET
ORAL_TABLET | Freq: Two times a day (BID) | ORAL | 3 refills | 90.00000 days
Start: 2019-09-19 — End: 2020-09-18

## 2019-09-19 MED ORDER — DOXAZOSIN 4 MG TABLET
ORAL_TABLET | Freq: Every evening | ORAL | 3 refills | 90.00000 days
Start: 2019-09-19 — End: 2020-09-18

## 2019-09-19 MED ORDER — LISINOPRIL 40 MG TABLET
ORAL_TABLET | Freq: Every day | ORAL | 3 refills | 90.00000 days
Start: 2019-09-19 — End: 2020-09-18

## 2019-09-19 MED ORDER — GABAPENTIN 100 MG CAPSULE
0.00000 days
Start: 2019-09-19 — End: ?

## 2019-09-19 MED ORDER — SITAGLIPTIN 50 MG TABLET
ORAL_TABLET | Freq: Every day | ORAL | 3 refills | 90 days
Start: 2019-09-19 — End: 2020-09-18

## 2019-09-19 NOTE — Unmapped (Addendum)
1. Continue current medications    2. In person visit if possible in 3 months    3. Labs through [facility] in [place] in one month

## 2019-09-20 DIAGNOSIS — I151 Hypertension secondary to other renal disorders: Principal | ICD-10-CM

## 2019-09-20 DIAGNOSIS — N2889 Other specified disorders of kidney and ureter: Principal | ICD-10-CM

## 2019-09-20 MED ORDER — CLONIDINE HCL 0.1 MG TABLET
ORAL_TABLET | Freq: Three times a day (TID) | ORAL | 3 refills | 90.00000 days | Status: CP
Start: 2019-09-20 — End: 2020-09-19

## 2019-09-20 NOTE — Unmapped (Signed)
[facility] Refill Coordination Note    Specialty Medication(s) to be Shipped:   Infectious Disease: Descovy and Tivicay    Other medication(s) to be shipped: N/A     Samuel [name], DOB: May 14, 1951  Phone: [phone] (home)       All above HIPAA information was verified with patient.     Was a Nurse, learning disability used for this call? No    Completed refill call assessment today to schedule patient's medication shipment from the [facility] [phone]).       Specialty medication(s) and dose(s) confirmed: Regimen is correct and unchanged.   Changes to medications: Madix reports no changes at this time.  Changes to insurance: No  Questions for the pharmacist: No    Confirmed patient received Welcome Packet with first shipment. The patient will receive a drug information handout for each medication shipped and additional FDA Medication Guides as required.       DISEASE/MEDICATION-SPECIFIC INFORMATION        N/A    SPECIALTY MEDICATION ADHERENCE     Medication Adherence    Patient reported X missed doses in the last month: 0  Specialty Medication: descovy 200-25mg   Patient is on additional specialty medications: Yes  Additional Specialty Medications: tivicay 50mg   Patient Reported Additional Medication X Missed Doses in the Last Month: 0  Patient is on more than two specialty medications: No                Descovy 200-25 mg: 3 days of medicine on hand   Tivicay 50 mg: 3 days of medicine on hand         SHIPPING     Shipping address confirmed in Epic.     Delivery Scheduled: Yes, Expected medication delivery date: 09/26/19.     Medication will be delivered via Next Day Courier to the prescription address in Epic WAM.    Nancy Nordmann [facility] Specialty Technician

## 2019-09-20 NOTE — Unmapped (Signed)
Good morning. I wanted to clarify these refills before sending them. Your note does not mention continuing Clonidine-Would you like me to send this refill? Thanks    [facility]

## 2019-09-25 DIAGNOSIS — N183 CKD (chronic kidney disease) stage 3, GFR 30-59 ml/min: Principal | ICD-10-CM

## 2019-09-25 MED FILL — DESCOVY 200 MG-25 MG TABLET: ORAL | 30 days supply | Qty: 30 | Fill #3

## 2019-09-25 MED FILL — TIVICAY 50 MG TABLET: 30 days supply | Qty: 30 | Fill #3 | Status: AC

## 2019-09-25 MED FILL — TIVICAY 50 MG TABLET: ORAL | 30 days supply | Qty: 30 | Fill #3

## 2019-09-25 MED FILL — DESCOVY 200 MG-25 MG TABLET: 30 days supply | Qty: 30 | Fill #3 | Status: AC

## 2019-09-26 MED ORDER — GABAPENTIN 300 MG CAPSULE
0.00000 days
Start: 2019-09-26 — End: ?

## 2019-10-04 NOTE — Unmapped (Signed)
Patient is considered out of care, with no office visit since 09/07/2018.    Patient called Paramedic back. Bridge Counselor spoke confidentially with patient to schedule appointment for retention. Phone appointment set for 11/01/2019.  During conversation patient stated that the following were barriers to care:   ??? Covid-19 concerns    Patient is currently on medications for HIV.      Duration of service: 8 minutes    Samuel [name]  Bridge Counselor  [facility]

## 2019-10-04 NOTE — Unmapped (Signed)
Patient is considered out of care, with no office visit since 09/07/2018.     Bridge Counselor attempted to call patient to schedule appointment for re-engagement. Patient was not available, left a discreet message for call back in both home and mobile voicemail box.    Bridge Counselor sent out of care text msg to patient to re-engage in care on both home and mobile numbers.    [facility] Out of Care Patient - Bridge Counseling Care Plan     Goal:  Patient will have an office visit with provider visit within 180 days of last office visit.     Interventions may include:  ?? Send patient an out of care notification via letter and/or MyChart  ?? Placing phone calls to patient and/or listed contacts   ?? Contacting pharmacies as needed for additional information  ?? Researching other online resources as needed     If patient is reached, the bridge counselor will attempt to:  ?? Discover the reason for missed appointments, no shows, or lack of scheduled follow-up appointment such as transportation, finances, child care, or competing needs  ?? Make appropriate referrals and link patient to clinic nurses, social workers, Artist or other support services in clinic to address concerns or barriers to care  ?? Send In-Basket message to social workers or nurses if case management is needed  ?? Schedule a follow-up appointment at appropriate interval based on assessment  ?? Monitor attendance at the appointment and provide reminder calls if needed  ?? Continue follow-up as indicated     Expected Outcome:  Patient will have an office visit with provider visit within 180 days of last office visit.     **If the patient is not able to be located or retained in HIV care a referral will be placed to the [place] HIV state bridge counselor for follow-up.    Duration of Service: 10 minutes    Samuel [name]  Bridge Counselor  [facility]

## 2019-10-04 NOTE — Unmapped (Signed)
Pt called and stated he wanted a nurse to call him back. This nurse called him and he did not answer-Msg left to call clinic back.

## 2019-10-05 DIAGNOSIS — B20 Human immunodeficiency virus [HIV] disease: Principal | ICD-10-CM

## 2019-10-06 MED ORDER — ALLOPURINOL 100 MG TABLET
ORAL | 0.00000 days
Start: 2019-10-06 — End: ?

## 2019-10-06 MED ORDER — DIAZEPAM 5 MG TABLET
ORAL | 0.00000 days
Start: 2019-10-06 — End: 2019-10-10

## 2019-10-09 DIAGNOSIS — N1832 Stage 3b chronic kidney disease: Principal | ICD-10-CM

## 2019-10-09 DIAGNOSIS — N179 Acute kidney failure, unspecified: Principal | ICD-10-CM

## 2019-10-09 NOTE — Unmapped (Addendum)
HISTORY OF PRESENT ILLNESS:      Samuel [name] is a 69 year old man with stage G4:A2 chronic kidney disease who is evaluated today for stage G4:A2 chronic kidney disease.  Over the past 2 weeks, he has developed worsening leg edema of unclear etiology.  His serum creatinine was found to rise to 3 mg/dL 1 assessed on 1/47/8295.  He has not started any new medications.  His diuretic regimen has been stable and includes torsemide 40 mg once daily and spironolactone 25 mg once daily.   He complains of dyspnea on one half block exertion.  He has 2 pillow orthopnea.  He does not have fever or chills.      MEDICATIONS:      Medication Sig   ??? albuterol (PROVENTIL HFA;VENTOLIN HFA) 90 mcg/actuation inhaler Inhale 2 puffs.   ??? allopurinoL (ZYLOPRIM) 100 MG tablet Take 250 mg by mouth.   ??? amLODIPine (NORVASC) 2.5 MG tablet Take 3 tablets (7.5 mg total) by mouth every morning.   ??? aspirin (ECOTRIN) 81 MG tablet    ??? atenoloL (TENORMIN) 100 MG tablet Take 1 tablet (100 mg total) by mouth daily.   ??? blood sugar diagnostic (ONETOUCH VERIO TEST STRIPS) Strp Use 2 (two) times daily E11.22   ??? calcium carbonate-vitamin D2 500 mg(1,250mg ) -200 unit tablet Take 1 tablet by mouth Two (2) times a day.   ??? cloNIDine HCL (CATAPRES) 0.1 MG tablet Take 1 tablet (0.1 mg total) by mouth 3 (three) times a day.   ??? cranberry 400 mg cap Take 400 mg by mouth daily.   ??? cyanocobalamin 1000 MCG tablet Take 1,000 mcg by mouth.   ??? cysteamine bitartrate (PROCYSBI) 300 mg grdp 1 each.   ??? diazepam (VALIUM) 5 MG tablet Take 5 mg by mouth nightly.    ??? dicyclomine (BENTYL) 10 mg capsule Take 10 mg by mouth two (2) times a day as needed.    ??? docusate sodium (COLACE) 100 MG capsule Take 1 capsule (100 mg total) by mouth every twelve (12) hours.   ??? dolutegravir (TIVICAY) 50 mg Tab TABLET Take 1 tablet by mouth daily   ??? dorzolamide-timoloL (COSOPT) 22.3-6.8 mg/mL ophthalmic solution INSTRILL 1 DROP INTO BOTH EYES EACH MORNING   ??? doxazosin (CARDURA) 4 MG tablet Take 1 tablet (4 mg total) by mouth nightly.   ??? emtricitabine-tenofovir alafen (DESCOVY) 200-25 mg tablet Take 1 tablet by mouth daily   ??? ferrous sulfate 325 (65 FE) MG tablet Take 325 mg by mouth daily with breakfast.    ??? folic acid (FOLVITE) 1 MG tablet    ??? glimepiride (AMARYL) 4 MG tablet Take 1 tablet (4 mg total) by mouth daily.   ??? hydrALAZINE (APRESOLINE) 100 MG tablet Take 1 tablet (100 mg total) by mouth Two (2) times a day.   ??? HYDROcodone-acetaminophen (NORCO) 5-325 mg per tablet Directions are take one tablet 30 minutes before physical therapy, then take one tablet at bedtime as needed for pain   ??? lisinopriL (PRINIVIL,ZESTRIL) 40 MG tablet Take 1 tablet (40 mg total) by mouth daily.   ??? meclizine (ANTIVERT) 25 mg tablet Take 25 mg by mouth Three (3) times a day as needed.    ??? nitroglycerin (NITROSTAT) 0.4 MG SL tablet DISSOLVE ONE TABLET UNDER TONGUE AS NEEDED FOR CHEST PAIN EVERY 5 MINUTES AS DIRECTED   ??? nystatin (MYCOSTATIN) 100,000 unit/mL suspension Take 5 mL by mouth daily.    ??? omega-3 fatty acids-fish oil 340-1,000 mg capsule Take 1 capsule  by mouth.   ??? ondansetron (ZOFRAN) 4 MG tablet take 1 tablet by mouth every 8 hours if needed for nausea   ??? oxyCODONE-acetaminophen (PERCOCET) 5-325 mg per tablet Take 1 tablet by mouth.   ??? ROCKLATAN 0.02-0.005 % Drop INT 1 GTT INTO OU QHS   ??? sertraline (ZOLOFT) 50 MG tablet take 1 tablet by mouth once daily   ??? SITagliptin (JANUVIA) 50 MG tablet Take 1 tablet (50 mg total) by mouth daily.   ??? spironolactone (ALDACTONE) 25 MG tablet Take 1 tablet (25 mg total) by mouth daily.   ??? tamsulosin (FLOMAX) 0.4 mg capsule TAKE 1 CAPSULE(0.4 MG) BY MOUTH DAILY   ??? torsemide (DEMADEX) 20 MG tablet Take 2 tablets (40 mg total) by mouth daily.   ??? atorvastatin (LIPITOR) 20 MG tablet TAKE 1 TABLET (20 MG) BY MOUTH DAILY   ??? donepeziL (ARICEPT) 10 MG tablet Take 10 mg by mouth.   ??? gabapentin (NEURONTIN) 100 MG capsule    ??? gabapentin (NEURONTIN) 300 MG capsule TAKE 1 CAPSULE BY MOUTH EVERY NIGHT AT BEDTIME FOR 4 DAYS THEN 1 CAPSULE TWICE DAILY   ??? meTOLAzone (ZAROXOLYN) 10 MG tablet Take 1 tablet (10 mg total) by mouth daily.       REVIEW OF SYSTEMS:  General: No fever or chills.  Cardiovascular: No chest pain.  All other systems are reviewed and are negative except that noted in the history of present illness.    PHYSICAL EXAMINATION:      General:   Vital signs:  BP 140/73 (BP Site: R Arm, BP Position: Sitting, BP Cuff Size: Large)  - Pulse 67  - Temp 36.1 ??C (96.9 ??F) (Temporal)  - Ht 180.3 cm (5' 11)  - Wt (!) 104.3 kg (230 lb)  - BMI 32.08 kg/m??   Wt Readings from Last 3 Encounters:   10/10/19 (!) 104.3 kg (230 lb)   07/14/18 (!) 102.9 kg (226 lb 14.4 oz)   06/28/18 100.3 kg (221 lb 3.2 oz)   HEENT: The pupils are round reactive to light. No scleral icterus.   Neck: Supple without lymphadenopathy or thyromegaly. No carotid bruits.   Lungs: Clear to auscultation   Heart: Regular rate and rhythm. Normal S1-S2.   Abdomen: Soft, nontender, normoactive bowel sounds, without organomegaly or mass. No abdominal bruits.     Neurologic: Alert and oriented x 3.  Strength is normal.  DTRs are 2+ and symmetric.  There are no lateralizing sensory deficits.  Extremities: 3+ pitting edema to the level of the thighs bilaterally. Dorsalis pedis and posterior tibial pulses are palpable.    Node survey: No lymphadenopathy.   Musculoskeletal: No active synovitis.   Skin/Nails:  No rash or nail changes.     LABORATORY STUDIES:     POCT Urinalysis Dipstick    Collection Time: 10/10/19  3:25 PM   Result Value Ref Range    Spec Gravity/POC 1.020 1.003 - 1.030    PH/POC 7.0 5.0 - 9.0    Leuk Esterase/POC Negative Negative    Nitrite/POC Negative Negative    Protein/POC Negative Negative    UA Glucose/POC Negative Negative    Ketones, POC Trace (A) Negative    Bilirubin/POC Negative Negative    Blood/POC Negative Negative    Urobilinogen/POC 0.2 0.2 - 1.0 mg/dL   POCT Urine Microscopic Exam    Collection Time: 10/10/19  4:23 PM   Result Value Ref Range    Color, UA Light Yellow     Clarity,  UA Clear     WBC UA, POC <2/hpf     RBC UA, POC <2/hpf     Renal tubular epithelial cells, POC None     Casts, POC Rare hyalofatty casts     Crystals, POC None     Bacteria, UA None     Other Microscopic Observations, POC     Albumin/creatinine urine ratio    Collection Time: 10/10/19  4:28 PM   Result Value Ref Range    Creat U 71.8 mg/dL    Albumin Quantitative, Urine 3.2 Undefined mg/dL    Albumin/Creatinine Ratio 44.6 (H) 0.0 - 30.0 ug/mg   Protein/Creatinine Ratio, Urine    Collection Time: 10/10/19  4:28 PM   Result Value Ref Range    Creat U 71.8 mg/dL    Protein, Ur 16.1 mg/dL    Protein/Creatinine Ratio, Urine 0.178    Hepatic Function Panel    Collection Time: 10/10/19  4:38 PM   Result Value Ref Range    Albumin 4.1 3.4 - 5.0 g/dL    Total Protein 7.6 5.7 - 8.2 g/dL    Total Bilirubin 0.6 0.3 - 1.2 mg/dL    Bilirubin, Direct 0.96 0.00 - 0.30 mg/dL    AST 22 <04 U/L    ALT 34 10 - 49 U/L    Alkaline Phosphatase 75 46 - 116 U/L   Phosphorus Level    Collection Time: 10/10/19  4:38 PM   Result Value Ref Range    Phosphorus 3.9 2.4 - 5.1 mg/dL   Magnesium Level    Collection Time: 10/10/19  4:38 PM   Result Value Ref Range    Magnesium 2.3 1.6 - 2.6 mg/dL   LYMPH MARKER COMPLETE, FLOW    Collection Time: 10/10/19  4:38 PM   Result Value Ref Range    CD3% (T Cells) 71 61 - 86 %    Absolute CD3 Count 1,491 915-3,400 /uL    CD4% (T Helper) 33 (L) 34 - 58 %    Absolute CD4 Count 693 510-2,320 /uL    CD8% T Suppressor 38 12 - 38 %    Absolute CD8 Count 798 180-1,520 /uL    CD4:CD8 Ratio 0.9 0.9 - 4.8    CD19% (B Cells) 13 7 - 23 %    Absolute CD19 Count 273 105 - 920 /uL    CD16/56% NK Cell 15 1 - 27 %    Absolute CD16/56 Count 315 15-1,080 /uL   CBC w/ Differential    Collection Time: 10/10/19  4:38 PM   Result Value Ref Range    WBC 10.1 3.5 - 10.5 10*9/L    RBC 4.32 4.32 - 5.72 10*12/L    HGB 13.4 (L) 13.5 - 17.5 g/dL    HCT 54.0 98.1 - 19.1 %    MCV 94.3 81.0 - 95.0 fL    MCH 31.0 26.0 - 34.0 pg    MCHC 32.9 30.0 - 36.0 g/dL    RDW 47.8 29.5 - 62.1 %    MPV 9.4 7.0 - 10.0 fL    Platelet 134 (L) 150 - 450 10*9/L    Neutrophils % 69.6 %    Lymphocytes % 21.0 %    Monocytes % 7.3 %    Eosinophils % 1.4 %    Basophils % 0.7 %    Absolute Neutrophils 7.0 1.7 - 7.7 10*9/L    Absolute Lymphocytes 2.1 0.7 - 4.0 10*9/L    Absolute Monocytes 0.7 0.1 - 1.0  10*9/L    Absolute Eosinophils 0.1 0.0 - 0.7 10*9/L    Absolute Basophils 0.1 0.0 - 0.1 10*9/L   Basic Metabolic Panel    Collection Time: 10/10/19  4:38 PM   Result Value Ref Range    Sodium 139 135 - 145 mmol/L    Potassium 4.3 3.5 - 5.1 mmol/L    Chloride 103 98 - 107 mmol/L    CO2 29.6 20.0 - 31.0 mmol/L    Anion Gap 6 3 - 11 mmol/L    BUN 23 9 - 23 mg/dL    Creatinine 6.29 (H) 0.60 - 1.10 mg/dL    BUN/Creatinine Ratio 8     EGFR CKD-EPI Non-African American, Male 22 mL/min/1.9m2    EGFR CKD-EPI African American, Male 25 mL/min/1.27m2    Glucose 130 70 - 179 mg/dL    Calcium 52.8 (H) 8.7 - 10.4 mg/dL       IMPRESSION AND PLAN:    1.  Stage G4:A2 chronic kidney disease.  The etiology of the recent decline of kidney function is not entirely clear.  I find no evidence of acute interstitial nephritis on examination of the urinary sediment.  I suspect to be hypervolemia is multifactorial with elements of diastolic dysfunction, worsening renal function, and underlying cirrhosis.  Although his serum creatinine is likely to worsen with increasing diuretic,  he is in need of diuresis given the marked hypervolemia.  Therefore, I added metolazone 10 mg once daily today.  He does not have nephrotic range proteinuria driving the sodium retention.  A kidney ultrasound be obtained to rule out urinary tract obstruction.  ??  2.    Resistant hypertension.  I added metolazone 10 mg daily in addition to torsemide 40 mg once daily, spironolactone 25 mg once daily, amlodipine 7.5 mg once daily, atenolol 100 mg once daily, doxazosin 4 mg daily, lisinopril 40 mg once daily, and hydralazine 100 mg twice daily.    3.  Human immunodeficiency virus disease.  He will follow up with Dr. Tomasa Hose.    4.  Lower urinary tract symptoms.    Continue tamsulosin.    5.  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  I tried to add a myeloma serum panel to his labs, but this was not possible. Will further evaluate his hypercalcemia at his next visit.   ??  6.  Nephrolithiasis.    He is asymptomatic.  ??  7.  Irritable bowel syndrome.  Continue on dicyclomine.  ??  8.  Follow up plans.  He will return to see me in about 2 weeks.      Zetta Bills. Stefano Gaul, MD  Date of service 10/10/2019

## 2019-10-10 ENCOUNTER — Ambulatory Visit: Admit: 2019-10-10 | Discharge: 2019-10-11 | Payer: MEDICARE | Attending: Nephrology | Primary: Nephrology

## 2019-10-10 DIAGNOSIS — N1832 Stage 3b chronic kidney disease: Principal | ICD-10-CM

## 2019-10-10 DIAGNOSIS — B20 Human immunodeficiency virus [HIV] disease: Principal | ICD-10-CM

## 2019-10-10 DIAGNOSIS — N2889 Other specified disorders of kidney and ureter: Principal | ICD-10-CM

## 2019-10-10 DIAGNOSIS — I5033 Acute on chronic diastolic (congestive) heart failure: Principal | ICD-10-CM

## 2019-10-10 DIAGNOSIS — N184 Chronic kidney disease, stage 4 (severe): Principal | ICD-10-CM

## 2019-10-10 DIAGNOSIS — B182 Chronic viral hepatitis C: Principal | ICD-10-CM

## 2019-10-10 DIAGNOSIS — K7469 Other cirrhosis of liver: Principal | ICD-10-CM

## 2019-10-10 LAB — BASIC METABOLIC PANEL
BLOOD UREA NITROGEN: 23 mg/dL (ref 9–23)
BUN / CREAT RATIO: 8
CALCIUM: 11.1 mg/dL — ABNORMAL HIGH (ref 8.7–10.4)
CHLORIDE: 103 mmol/L (ref 98–107)
CO2: 29.6 mmol/L (ref 20.0–31.0)
CREATININE: 2.85 mg/dL — ABNORMAL HIGH (ref 0.60–1.10)
EGFR CKD-EPI NON-AA MALE: 22 mL/min/{1.73_m2}
GLUCOSE RANDOM: 130 mg/dL (ref 70–179)
POTASSIUM: 4.3 mmol/L (ref 3.5–5.1)

## 2019-10-10 LAB — CBC W/ AUTO DIFF
BASOPHILS ABSOLUTE COUNT: 0.1 10*9/L (ref 0.0–0.1)
BASOPHILS RELATIVE PERCENT: 0.7 %
EOSINOPHILS ABSOLUTE COUNT: 0.1 10*9/L (ref 0.0–0.7)
EOSINOPHILS RELATIVE PERCENT: 1.4 %
HEMATOCRIT: 40.8 % (ref 38.0–50.0)
HEMOGLOBIN: 13.4 g/dL — ABNORMAL LOW (ref 13.5–17.5)
LYMPHOCYTES ABSOLUTE COUNT: 2.1 10*9/L (ref 0.7–4.0)
LYMPHOCYTES RELATIVE PERCENT: 21 %
MEAN CORPUSCULAR HEMOGLOBIN CONC: 32.9 g/dL (ref 30.0–36.0)
MEAN CORPUSCULAR HEMOGLOBIN: 31 pg (ref 26.0–34.0)
MEAN CORPUSCULAR VOLUME: 94.3 fL (ref 81.0–95.0)
MEAN PLATELET VOLUME: 9.4 fL (ref 7.0–10.0)
MONOCYTES RELATIVE PERCENT: 7.3 %
NEUTROPHILS ABSOLUTE COUNT: 7 10*9/L (ref 1.7–7.7)
NEUTROPHILS RELATIVE PERCENT: 69.6 %
RED CELL DISTRIBUTION WIDTH: 13.9 % (ref 12.0–15.0)
WBC ADJUSTED: 10.1 10*9/L (ref 3.5–10.5)

## 2019-10-10 LAB — MAGNESIUM: Magnesium:MCnc:Pt:Ser/Plas:Qn:: 2.3

## 2019-10-10 LAB — EGFR CKD-EPI AA MALE: Glomerular filtration rate/1.73 sq M.predicted.black:ArVRat:Pt:Ser/Plas/Bld:Qn:Creatinine-based formula (CKD-EPI): 25

## 2019-10-10 LAB — HEPATIC FUNCTION PANEL
AST (SGOT): 22 U/L (ref <34)
BILIRUBIN DIRECT: 0.2 mg/dL (ref 0.00–0.30)
BILIRUBIN TOTAL: 0.6 mg/dL (ref 0.3–1.2)
PROTEIN TOTAL: 7.6 g/dL (ref 5.7–8.2)

## 2019-10-10 LAB — ALBUMIN / CREATININE URINE RATIO: ALBUMIN QUANT URINE: 3.2 mg/dL

## 2019-10-10 LAB — PHOSPHORUS: Phosphate:MCnc:Pt:Ser/Plas:Qn:: 3.9

## 2019-10-10 LAB — EOSINOPHILS ABSOLUTE COUNT: Eosinophils:NCnc:Pt:Bld:Qn:Automated count: 0.1

## 2019-10-10 LAB — CREATININE, URINE
Lab: 71.8
Lab: 71.8

## 2019-10-10 LAB — PROTEIN / CREATININE RATIO, URINE: CREATININE, URINE: 71.8 mg/dL

## 2019-10-10 LAB — ALBUMIN: Albumin:MCnc:Pt:Ser/Plas:Qn:: 4.1

## 2019-10-10 MED ORDER — METOLAZONE 10 MG TABLET
ORAL_TABLET | Freq: Every day | ORAL | 3 refills | 90.00000 days | Status: CP
Start: 2019-10-10 — End: 2020-10-09

## 2019-10-10 NOTE — Unmapped (Signed)
Patient labs drawn in room prior to leaving Nephrology clinic

## 2019-10-10 NOTE — Unmapped (Addendum)
1. Add metolazone 10 mg daily    2. Weigh yourself daily and call me if your weight does not fall over the next 3 days    3. Labs today     4. Follow up October 31, 2019    5. Echocardiogram.

## 2019-10-10 NOTE — Unmapped (Signed)
AOBP:right arm large  cuff   Average:140/73 Pulse: 67  1st reading:145/73 Pulse: 70  2nd reading:138/74 Pulse: 66  3rd reading:138/71 Pulse: 66

## 2019-10-11 LAB — LYMPH MARKER COMPLETE, FLOW
ABSOLUTE CD16/56 CNT: 315 {cells}/uL (ref 15–1080)
ABSOLUTE CD19 CNT: 273 {cells}/uL (ref 105–920)
ABSOLUTE CD3 CNT: 1491 {cells}/uL (ref 915–3400)
ABSOLUTE CD8 CNT: 798 {cells}/uL (ref 180–1520)
CD16/56%NK CELL": 15 % (ref 1–27)
CD19% (B CELLS)": 13 % (ref 7–23)
CD3% (T CELLS)": 71 % (ref 61–86)
CD4% (T HELPER)": 33 % — ABNORMAL LOW (ref 34–58)

## 2019-10-11 LAB — ABSOLUTE CD16/56 CNT: Lab: 315

## 2019-10-11 LAB — HIV RNA, QUANTITATIVE, PCR

## 2019-10-11 LAB — HIV RNA LOG(10): Lab: 0

## 2019-10-11 NOTE — Unmapped (Signed)
Bridge Counselor attempted to call patient to let them know that their provider needs them to go into their local [facility] and get labs. Patient was not available, left a discreet message for call back in voicemail box.    Bridge Counselor sent out of care text msg to let the patient know that their provider needs them to go into their local [facility] and get labs.    Duration of Service: 8 minutes    [name]  Bridge Counselor  [facility]

## 2019-10-20 ENCOUNTER — Ambulatory Visit: Admit: 2019-10-20 | Discharge: 2019-10-21 | Payer: MEDICARE

## 2019-10-23 DIAGNOSIS — N184 Chronic kidney disease, stage 4 (severe): Principal | ICD-10-CM

## 2019-10-23 NOTE — Unmapped (Signed)
[facility] Refill Coordination Note    Specialty Medication(s) to be Shipped:   Infectious Disease: Descovy and Tivicay    Other medication(s) to be shipped: N/A     Samuel [name], DOB: 10-21-1950  Phone: [phone] (home)       All above HIPAA information was verified with patient.     Was a Nurse, learning disability used for this call? No    Completed refill call assessment today to schedule patient's medication shipment from the [facility] [phone]).       Specialty medication(s) and dose(s) confirmed: Regimen is correct and unchanged.   Changes to medications: Samuel [name] reports no changes at this time.  Changes to insurance: No  Questions for the pharmacist: No    Confirmed patient received Welcome Packet with first shipment. The patient will receive a drug information handout for each medication shipped and additional FDA Medication Guides as required.       DISEASE/MEDICATION-SPECIFIC INFORMATION        N/A    SPECIALTY MEDICATION ADHERENCE     Medication Adherence    Patient reported X missed doses in the last month: 0  Specialty Medication: tivicay  Patient is on additional specialty medications: Yes  Additional Specialty Medications: descovy  Patient Reported Additional Medication X Missed Doses in the Last Month: 0  Patient is on more than two specialty medications: No                Descovy 200-25 mg: 3 days of medicine on hand   Tivicay 50 mg: 3 days of medicine on hand         SHIPPING     Shipping address confirmed in Epic.     Delivery Scheduled: Yes, Expected medication delivery date: 10/25/19.     Medication will be delivered via Next Day Courier to the prescription address in Epic WAM.    Samuel [name] [facility] Specialty Technician

## 2019-10-24 MED FILL — TIVICAY 50 MG TABLET: 30 days supply | Qty: 30 | Fill #4 | Status: AC

## 2019-10-24 MED FILL — DESCOVY 200 MG-25 MG TABLET: ORAL | 30 days supply | Qty: 30 | Fill #4

## 2019-10-24 MED FILL — DESCOVY 200 MG-25 MG TABLET: 30 days supply | Qty: 30 | Fill #4 | Status: AC

## 2019-10-24 MED FILL — TIVICAY 50 MG TABLET: ORAL | 30 days supply | Qty: 30 | Fill #4

## 2019-10-29 NOTE — Unmapped (Addendum)
HISTORY OF PRESENT ILLNESS:      [name] is a 69 year old man with stage G4:A2 chronic kidney disease who is evaluated today for stage G4:A2 chronic kidney disease.  He notes significant resolution of leg edema since his last visit after adding metolazone 10 mg once daily to his regimen.  He continues to have a protuberant abdomen and dyspnea with about 1 block of exertion.  He does not have chest pain or PND.    MEDICATIONS:      Medication Sig   ??? albuterol (PROVENTIL HFA;VENTOLIN HFA) 90 mcg/actuation inhaler Inhale 2 puffs.   ??? allopurinoL (ZYLOPRIM) 100 MG tablet Take 250 mg by mouth.   ??? amLODIPine (NORVASC) 2.5 MG tablet Take 3 tablets (7.5 mg total) by mouth every morning.   ??? aspirin (ECOTRIN) 81 MG tablet    ??? atenoloL (TENORMIN) 100 MG tablet Take 1 tablet (100 mg total) by mouth daily.   ??? atorvastatin (LIPITOR) 20 MG tablet TAKE 1 TABLET (20 MG) BY MOUTH DAILY   ??? blood sugar diagnostic (ONETOUCH VERIO TEST STRIPS) Strp Use 2 (two) times daily E11.22   ??? calcium carbonate-vitamin D2 500 mg(1,250mg ) -200 unit tablet Take 1 tablet by mouth Two (2) times a day.   ??? cloNIDine HCL (CATAPRES) 0.1 MG tablet Take 1 tablet (0.1 mg total) by mouth 3 (three) times a day.   ??? cranberry 400 mg cap Take 400 mg by mouth daily.   ??? cyanocobalamin 1000 MCG tablet Take 1,000 mcg by mouth.   ??? cysteamine bitartrate (PROCYSBI) 300 mg grdp 1 each.   ??? diazepam (VALIUM) 5 MG tablet Take 5 mg by mouth nightly.    ??? dicyclomine (BENTYL) 10 mg capsule Take 10 mg by mouth two (2) times a day as needed.    ??? docusate sodium (COLACE) 100 MG capsule Take 1 capsule (100 mg total) by mouth every twelve (12) hours.   ??? dolutegravir (TIVICAY) 50 mg Tab TABLET Take 1 tablet by mouth daily   ??? donepeziL (ARICEPT) 10 MG tablet Take 10 mg by mouth.   ??? dorzolamide-timoloL (COSOPT) 22.3-6.8 mg/mL ophthalmic solution INSTRILL 1 DROP INTO BOTH EYES EACH MORNING   ??? doxazosin (CARDURA) 4 MG tablet Take 1 tablet (4 mg total) by mouth nightly.   ??? emtricitabine-tenofovir alafen (DESCOVY) 200-25 mg tablet Take 1 tablet by mouth daily   ??? ferrous sulfate 325 (65 FE) MG tablet Take 325 mg by mouth daily with breakfast.    ??? folic acid (FOLVITE) 1 MG tablet    ??? gabapentin (NEURONTIN) 300 MG capsule TAKE 1 CAPSULE BY MOUTH EVERY NIGHT AT BEDTIME FOR 4 DAYS THEN 1 CAPSULE TWICE DAILY   ??? glimepiride (AMARYL) 4 MG tablet Take 1 tablet (4 mg total) by mouth daily.   ??? hydrALAZINE (APRESOLINE) 100 MG tablet Take 1 tablet (100 mg total) by mouth Two (2) times a day.   ??? HYDROcodone-acetaminophen (NORCO) 5-325 mg per tablet Directions are take one tablet 30 minutes before physical therapy, then take one tablet at bedtime as needed for pain   ??? lisinopriL (PRINIVIL,ZESTRIL) 40 MG tablet Take 1 tablet (40 mg total) by mouth daily.   ??? meclizine (ANTIVERT) 25 mg tablet Take 25 mg by mouth Three (3) times a day as needed.    ??? meTOLAzone (ZAROXOLYN) 10 MG tablet Take 1 tablet (10 mg total) by mouth daily.   ??? nitroglycerin (NITROSTAT) 0.4 MG SL tablet DISSOLVE ONE TABLET UNDER TONGUE AS NEEDED FOR CHEST PAIN  EVERY 5 MINUTES AS DIRECTED   ??? nystatin (MYCOSTATIN) 100,000 unit/mL suspension Take 5 mL by mouth daily.    ??? omega-3 fatty acids-fish oil 340-1,000 mg capsule Take 1 capsule by mouth.   ??? ondansetron (ZOFRAN) 4 MG tablet take 1 tablet by mouth every 8 hours if needed for nausea   ??? oxyCODONE-acetaminophen (PERCOCET) 5-325 mg per tablet Take 1 tablet by mouth.   ??? ROCKLATAN 0.02-0.005 % Drop INT 1 GTT INTO OU QHS   ??? sertraline (ZOLOFT) 50 MG tablet take 1 tablet by mouth once daily   ??? SITagliptin (JANUVIA) 50 MG tablet Take 1 tablet (50 mg total) by mouth daily.   ??? spironolactone (ALDACTONE) 25 MG tablet Take 1 tablet (25 mg total) by mouth daily.   ??? tamsulosin (FLOMAX) 0.4 mg capsule TAKE 1 CAPSULE(0.4 MG) BY MOUTH DAILY   ??? torsemide (DEMADEX) 20 MG tablet Take 2 tablets (40 mg total) by mouth daily.     REVIEW OF SYSTEMS:  Constitutional: No fevers or chills. Cardiovascular: No chest pain.   All other systems are reviewed and are negative except that noted in the history of present illness.    PHYSICAL EXAMINATION:      Constitutional: He is in no acute distress.  Vital signs:  BP 118/69 (BP Site: R Arm, BP Position: Sitting, BP Cuff Size: Medium)  - Pulse 57  - Temp 36.5 ??C (97.7 ??F) (Temporal)  - Ht 180.3 cm (5' 11)  - Wt 98.4 kg (217 lb)  - BMI 30.27 kg/m??   Wt Readings from Last 3 Encounters:   10/31/19 98.4 kg (217 lb)   10/10/19 (!) 104.3 kg (230 lb)   07/14/18 (!) 102.9 kg (226 lb 14.4 oz)   HEENT: The pupils are round reactive to light. No scleral icterus.   Neck: Supple without lymphadenopathy or thyromegaly. No carotid bruits.   Lungs: Clear to auscultation   Heart: Regular rate and rhythm. Normal S1-S2.   Abdomen: Soft, nontender, normoactive bowel sounds, without organomegaly or mass. No abdominal bruits.     Neurologic: Alert and oriented x 3.  Strength is normal.  DTRs are 2+ and symmetric.  There are no lateralizing sensory deficits.  Extremities: No pedal edema. Dorsalis pedis and posterior tibial pulses are palpable.    Node survey: No lymphadenopathy.   Musculoskeletal: No active synovitis.   Skin/Nails:  No rash or nail changes.     LABORATORY STUDIES:     TSH    Collection Time: 10/31/19  9:03 AM   Result Value Ref Range    TSH 0.367 (L) 0.550 - 4.780 uIU/mL   T4, free    Collection Time: 10/31/19  9:03 AM   Result Value Ref Range    Free T4 0.85 (L) 0.89 - 1.76 ng/dL   PTH    Collection Time: 10/31/19  9:03 AM   Result Value Ref Range    PTH 66.7 12.0 - 72.0 pg/mL    Calcium 10.3 (H) 8.5 - 10.2 mg/dL   Magnesium Level    Collection Time: 10/31/19  9:03 AM   Result Value Ref Range    Magnesium 2.8 (H) 1.6 - 2.6 mg/dL   Renal Function Panel    Collection Time: 10/31/19  9:03 AM   Result Value Ref Range    Sodium 134 (L) 135 - 145 mmol/L    Potassium 4.5 3.5 - 5.1 mmol/L    Chloride 103 98 - 107 mmol/L    CO2 22.7 20.0 -  31.0 mmol/L Anion Gap 8 3 - 11 mmol/L    BUN 40 (H) 9 - 23 mg/dL    Creatinine 1.61 (H) 0.60 - 1.10 mg/dL    BUN/Creatinine Ratio 10     EGFR CKD-EPI Non-African American, Male 14 mL/min/1.41m2    EGFR CKD-EPI African American, Male 17 mL/min/1.68m2    Glucose 145 70 - 179 mg/dL    Calcium 09.6 (H) 8.7 - 10.4 mg/dL    Phosphorus 4.4 2.4 - 5.1 mg/dL    Albumin 4.3 3.4 - 5.0 g/dL   Myeloma Workup Chemistries    Collection Time: 10/31/19  9:03 AM   Result Value Ref Range    Total Protein 8.1 6.5 - 8.3 g/dL    Total IgG 0,454 098-1,191 mg/dL    IgM 59 35 - 478 mg/dL    IgA 295.6 21.3 - 086.5 mg/dL     Serum creatinine trend:    Date Value   10/31/2019 4.01 (H)   10/10/2019 2.85 (H)   07/31/2019 2.30 (H)   05/16/2019 2.33 (H)   05/15/2019 2.45 (H)     IMAGING:    Echocardiogram 10/20/2019:      1. The left ventricle is normal in size with mildly increased wall       thickness.    2. The left ventricular systolic function is normal, LVEF is visually       estimated at 60-65%.    3. There is grade I diastolic dysfunction (impaired relaxation).    4. The right ventricle is normal in size, with normal systolic function.    5. The mitral valve leaflets are mildly thickened with normal leaflet       mobility.    6. The aortic valve is trileaflet with moderately thickened leaflets with       normal excursion.    7. IVC size and inspiratory change suggest mildly elevated right atrial       pressure. (5-10 mmHg).    Kidney Ultrasound 10/20/2019:    1. Stable bilateral simple and benign appearing simple renal cysts.  2. Bilateral nonobstructing nephrolithiasis measuring up to 1.1 cm in the left.  3. No hydronephrosis.    Right kidney: 11.4 cm   Left kidney: 12.1 cm    ---------------------------------------------------------------------------------  IMPRESSION AND PLAN:    1.  Stage G4:A2 chronic kidney disease.  His volume status is much improved after the recent 15 pound diuresis.  The rise in the serum creatinine is expected and consistent with the diuresis.  This degree of permissive azotemia is acceptable given the degree of hypervolemia he had previously.  I suspect that the sodium and water retention is consequent to underlying liver disease compounded by diastolic dysfunction.  The eGFR is now in a range where we need to consider his candidacy for transplantation.  He will require repeat assessment of his liver disease prior to proceeding with such. I will refer him back to hepatology. I suspect he may require a Fibroscan and liver ultrasound with portal system Doppler, but I will leave these decisions in the hands of the hepatologists.   ??  2.  Resistant hypertension.    His blood pressure is now well controlled on a multi-drug regimen of metolazone 10 mg daily, torsemide 40 mg once daily, spironolactone 25 mg once daily, amlodipine 7.5 mg once daily, atenolol 100 mg once daily, doxazosin 4 mg daily, lisinopril 40 mg once daily, and hydralazine 100 mg twice daily.    3.  Human immunodeficiency virus  disease.  He has follow-up scheduled with Dr. Tomasa Hose.    4.  Lower urinary tract symptoms.    Continue tamsulosin.    5.  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 Screening studies were sent to evaluate the etiology of the hypercalcemia. These are pending at the time this note was closed and will be reviewed once available.   ??  6.  Nephrolithiasis.    He is asymptomatic.  ??  7.  Irritable bowel syndrome.  Continue on dicyclomine.  ??  8.  Follow up plans.  He will return to see me in about 6 weeks.    I personally spent 50 minutes face-to-face and non-face-to-face in the care of this patient, which includes all pre, intra, and post visit time on the date of service.    Zetta Bills. Stefano Gaul, MD  Date of service 10/31/2019

## 2019-10-30 NOTE — Unmapped (Signed)
Assessment/Plan:            .   HIV:  Doing well with undetectable HIV VL on 10/10/2019 on current ART (Descovy/dolutegravir), which he tolerates well. I reviewed chart in anticipation of today's visit. We discussed labs.  Will continue current regimen. Cirrhosis and renal insufficiency continue to be his biggest current medical problems.      Obesity: Nutrition counseling requested.    S/p L shoulder surgery:  Followed by his PCP.    Cirrhosis: Followed by GI. I requested f/u with [facility] and abdominal US for liver cancer screening.    SVR following Zepatier for Chronic HCV Infection (genotype 1a): Stable.    CKD: Stage 3 -- Followed by Dr Stefano Gaul, who has referred him to Transplant Hepatology, given concern for worsened GFR in setting of cirrhosis.    HFpEF: Followed by Cardiology.    HTN: Stable.    Sexual Health:     Mental Health: Stable.    Health maintenance:   Reports COVID vaccine x 2 at [facility] at least 2 mos ago (~07/2019)  Pap N/A  RPR False + (1:2 with neg FTA 11/12/2015)  GC/CT - neg 08/26/2016  Hb A1C 11/12/2015: 5.7   Cholesterol 11/12/2015: total cholesterol 148, HDL 37, LDL (calc) 80, triglycerides 155  TB screening neg, 06/10/2016  Lung cancer screening N/A (quit 1993 after 1 ppd x 10 yrs)  Colonoscopy - Requested for screening    Pneumococcal and second Shingrix vaccine requested today.        Prevention, adherence and health education:   .  .  .  .  .  .  .    Educational and counseling services took 25  minutes of today's visit.    Follow-up:  Return to clinic in 1 month or sooner if needed.   Subjective:    Samuel [name] is a 69 y.o. male who presents to the Infectious Disease clinic for return HIV visit.     Chief Complaint: No chief complaint on file.      HPI:   HIV diagnosed in 12/1999. In 07/13/00, CD4 count was 547 (25%) with a viral load of about 5293. For a number of years, his viral load and CD4 count were generally reasonably well maintained in the absence of antiretroviral therapy, and he never had any HIV-related symptoms. However, during 2008-2009, his viral load dramatically increased and his CD4 count fell. Viral load reached a height of 309,000 on 08/31/2007 and CD4 count fell substantially to 244 on 09/29/2007. He had always been reluctant to take antiviral medications and in fact consistently and repeatedly refused hepatitis C treatment as well as HIV treatment in the past. However, because of the deterioration of his CD4 count and viral load, I prescribed Atripla on 10/12/07. Although he agreed to take it he did not done so until more than a year later, despite assurances during each visit that he would begin ART immediately. Once he finally started ART, however, he tolerated it quite well. On 07/24/2015 he was switched from Atripla to Descovy/dolutegravir b/o CKD.     TODAY'S VISIT WAS CONDUCTED BY PHONE B/O THE PANDEMIC:    His HIV has been well controlled, but he has multiple severe comorbidities, including HCV (SVR after 12 wks of Zepatier 04/20/2016-07/13/2016; HCV RNA BDL 10/28/2016), resultant cirrhosis (Metavir F4), CKD previously Stage 3, NSTEMI 10/2015. He is followed closely by Nephrology. Also followed by GI, and Cardiology. Last seen by Dr Stefano Gaul (Nephrology) on 10/10/2019; CKD  has further progressed to stage G4:A2. Hyperglycemia noted as well with A1C of 6.8 (followed by Dr Marcello Fennel).     Reports that diuretic given by Dr Stefano Gaul worked well and his leg swelling has markedly decreased.     Has missed no ART doses since I last saw him. Staying isolated in house with wife. Feels really good.      ROS otherwise unremarkable.     **SCREENING LIVER US, RETURN TO GI, NO LABS, ART 06/2019)    Past Medical History:   Diagnosis Date   ??? CKD (chronic kidney disease) stage 3, GFR 30-59 ml/min 01/08/2015   ??? Coronary artery disease 10/2015    Multivessel disease, plan medical management after cath 6/17   ??? GERD (gastroesophageal reflux disease)    ??? Gout    ??? HIV (human immunodeficiency virus infection) (CMS-HCC)     Undectable viral load 5/17   ??? Hypertension    ??? Nephrolithiasis    ??? Nephrotic syndrome 03/05/2014   ??? Renal mass     Followed by neprhology, MRI 8/16 improved, felt to be benign       Medications:  Current Outpatient Medications   Medication Sig Dispense Refill   ??? albuterol (PROVENTIL HFA;VENTOLIN HFA) 90 mcg/actuation inhaler Inhale 2 puffs.     ??? allopurinoL (ZYLOPRIM) 100 MG tablet Take 250 mg by mouth.     ??? amLODIPine (NORVASC) 2.5 MG tablet Take 3 tablets (7.5 mg total) by mouth every morning. 90 tablet 3   ??? aspirin (ECOTRIN) 81 MG tablet   0   ??? atenoloL (TENORMIN) 100 MG tablet Take 1 tablet (100 mg total) by mouth daily. 90 tablet 3   ??? atorvastatin (LIPITOR) 20 MG tablet TAKE 1 TABLET (20 MG) BY MOUTH DAILY 90 tablet 3   ??? blood sugar diagnostic (ONETOUCH VERIO TEST STRIPS) Strp Use 2 (two) times daily E11.22     ??? calcium carbonate-vitamin D2 500 mg(1,250mg ) -200 unit tablet Take 1 tablet by mouth Two (2) times a day.     ??? cloNIDine HCL (CATAPRES) 0.1 MG tablet Take 1 tablet (0.1 mg total) by mouth 3 (three) times a day. 270 tablet 3   ??? cranberry 400 mg cap Take 400 mg by mouth daily.     ??? cyanocobalamin 1000 MCG tablet Take 1,000 mcg by mouth.     ??? cysteamine bitartrate (PROCYSBI) 300 mg grdp 1 each.     ??? diazepam (VALIUM) 5 MG tablet Take 5 mg by mouth nightly.      ??? dicyclomine (BENTYL) 10 mg capsule Take 10 mg by mouth two (2) times a day as needed.      ??? docusate sodium (COLACE) 100 MG capsule Take 1 capsule (100 mg total) by mouth every twelve (12) hours. 60 capsule 0   ??? dolutegravir (TIVICAY) 50 mg Tab TABLET Take 1 tablet by mouth daily 30 tablet 11   ??? donepeziL (ARICEPT) 10 MG tablet Take 10 mg by mouth.     ??? dorzolamide-timoloL (COSOPT) 22.3-6.8 mg/mL ophthalmic solution INSTRILL 1 DROP INTO BOTH EYES EACH MORNING     ??? doxazosin (CARDURA) 4 MG tablet Take 1 tablet (4 mg total) by mouth nightly. 90 tablet 3   ??? emtricitabine-tenofovir alafen (DESCOVY) 200-25 mg tablet Take 1 tablet by mouth daily 30 tablet 11   ??? ferrous sulfate 325 (65 FE) MG tablet Take 325 mg by mouth daily with breakfast.      ??? folic acid (FOLVITE) 1 MG tablet  0   ??? gabapentin (NEURONTIN) 100 MG capsule      ??? gabapentin (NEURONTIN) 300 MG capsule TAKE 1 CAPSULE BY MOUTH EVERY NIGHT AT BEDTIME FOR 4 DAYS THEN 1 CAPSULE TWICE DAILY     ??? glimepiride (AMARYL) 4 MG tablet Take 1 tablet (4 mg total) by mouth daily. 90 tablet 3   ??? hydrALAZINE (APRESOLINE) 100 MG tablet Take 1 tablet (100 mg total) by mouth Two (2) times a day. 180 tablet 3   ??? HYDROcodone-acetaminophen (NORCO) 5-325 mg per tablet Directions are take one tablet 30 minutes before physical therapy, then take one tablet at bedtime as needed for pain     ??? lisinopriL (PRINIVIL,ZESTRIL) 40 MG tablet Take 1 tablet (40 mg total) by mouth daily. 90 tablet 3   ??? meclizine (ANTIVERT) 25 mg tablet Take 25 mg by mouth Three (3) times a day as needed.      ??? meTOLAzone (ZAROXOLYN) 10 MG tablet Take 1 tablet (10 mg total) by mouth daily. 90 tablet 3   ??? nitroglycerin (NITROSTAT) 0.4 MG SL tablet DISSOLVE ONE TABLET UNDER TONGUE AS NEEDED FOR CHEST PAIN EVERY 5 MINUTES AS DIRECTED 25 tablet 0   ??? nystatin (MYCOSTATIN) 100,000 unit/mL suspension Take 5 mL by mouth daily.      ??? omega-3 fatty acids-fish oil 340-1,000 mg capsule Take 1 capsule by mouth.     ??? ondansetron (ZOFRAN) 4 MG tablet take 1 tablet by mouth every 8 hours if needed for nausea     ??? oxyCODONE-acetaminophen (PERCOCET) 5-325 mg per tablet Take 1 tablet by mouth.     ??? ROCKLATAN 0.02-0.005 % Drop INT 1 GTT INTO OU QHS     ??? sertraline (ZOLOFT) 50 MG tablet take 1 tablet by mouth once daily     ??? SITagliptin (JANUVIA) 50 MG tablet Take 1 tablet (50 mg total) by mouth daily. 90 tablet 3   ??? spironolactone (ALDACTONE) 25 MG tablet Take 1 tablet (25 mg total) by mouth daily. 90 tablet 3   ??? tamsulosin (FLOMAX) 0.4 mg capsule TAKE 1 CAPSULE(0.4 MG) BY MOUTH DAILY 90 capsule 3   ??? torsemide (DEMADEX) 20 MG tablet Take 2 tablets (40 mg total) by mouth daily. 90 tablet 3     No current facility-administered medications for this visit.       Allergies: Colchicine analogues and Tramadol    Social History:  As per MEDICAL RECORD NUMBERNo cigs or alcohol at all. No current IDU (quit many years ago). Lives with wife.     Review of Systems:  A 12 point review of systems was negative except for pertinent items noted in the HPI.    Objective:       There were no vitals taken for this visit.    GEN:  looks well, no apparent distress  EYES: sclerae anicteric and non injected  ZOX:WRUEAVWU on top and bottom. No lesions.  LYMPH:no cervical or supraclavicular LAD  CV:no peripheral edema, normal pulses. Nl s1, s2, without s3, s4 +2/6 sys m diffusely over precordium (except no radiation to neck or axilla)  PULM:CTAB ant/post, normal work of breathing  ABD: Protuberant - but not taut, nontender, no masses felt.  JW:[id number]  RECTAL:deferred  SKIN:no petechiae, ecchymoses or obvious rashes on clothed exam  MSK: L shoulder abduction limited by pain; 1+ pitting edema bilat  NEURO:CN II-XII intact  PSYCH:attentive, appropriate affect, good eye contact, fluent speech    Recent Labs:    Lab Results  Component Value Date    RPR Nonreactive 06/08/2018    RPR Reactive (A) 11/12/2015    GCNAA Negative 06/08/2018    CTNAA Negative 06/08/2018    A1C 6.8 (H) 06/08/2018    A1C 5.7 11/12/2015    CHOL 111 09/16/2016    CHOL 148 11/12/2015    CHOL 120 07/24/2015    LDL 39 (L) 09/16/2016    LDL 80 11/12/2015    LDL 46 (L) 07/24/2015    HDL 32 (L) 09/16/2016    HDL 37 (L) 11/12/2015    HDL 52 07/24/2015    TRIG 454 (H) 09/16/2016    TRIG 155 (H) 11/12/2015    TRIG 111 07/24/2015    CHOLHDLRATIO 3.5 09/16/2016    CHOLHDLRATIO 4.0 11/12/2015    CHOLHDLRATIO 2.3 07/24/2015    QFTTBGOLD Negative 06/08/2018         Absolute CD4 Count   Date Value Ref Range Status   10/10/2019 693 510-2,320 /uL Final   09/01/2017 417 (L) 510-2,320 /uL Final   04/02/2017 290 (L) 510-2,320 /uL Final   09/16/2016 547 510 - 2,320 /uL Final   08/01/2014 514 510 - 2,320 /uL Final   01/03/2014 430 (L) 510 - 2,320 /uL Final   11/23/2012 549 510 - 2,320 /uL Final   07/20/2012 470 (L) 510 - 2,320 CELLS/UL Final     CD4% (T Helper)   Date Value Ref Range Status   10/10/2019 33 (L) 34 - 58 % Final   09/01/2017 27 (L) 34 - 58 % Final   04/02/2017 29 (L) 34 - 58 % Final   09/16/2016 28 (L) 34 - 58 % Final   08/01/2014 35 34 - 58 % Final   01/03/2014 34 34 - 58 % Final   11/23/2012 31 (L) 34 - 58 % Final   07/20/2012 29 (L) 34 - 58 % OF LYMPHS Final     HIV RNA Quant Result   Date Value Ref Range Status   10/10/2019 Not Detected Not Detected Final   06/08/2018 Not Detected Not Detected Final   12/01/2017 Detected (A) Not Detected Final   09/01/2017 Detected (A) Not Detected Final   08/15/2014 Not Detected  Final   01/03/2014 Not Detected  Final   11/23/2012 Not Detected  Final   07/20/2012 Not Detected  Final     HIV RNA   Date Value Ref Range Status   05/16/2019 <40 copies/mL Final     Comment:     HIV-1 RNA not detected  The reportable range for this assay is 40 to 10,000,000  copies HIV-1 RNA/mL.     09/12/2018 <40 copies/mL Final     Comment:     HIV-1 RNA not detected  The reportable range for this assay is 40 to 10,000,000  copies HIV-1 RNA/mL.     12/01/2017 <40 (H) <0 copies/mL Final   09/01/2017 <40 (H) <0 copies/mL Final   04/02/2017 <40 (H) <0 copies/mL Final   09/16/2016 <40 (H) <0 copies/mL Final     HIV RNA Log(10)   Date Value Ref Range Status   05/16/2019 CANCELED log10copy/mL      Comment:     Unable to calculate result since non-numeric result obtained for  component test.    Result canceled by the ancillary.     09/12/2018 CANCELED log10copy/mL      Comment:     Unable to calculate result since non-numeric result obtained for  component test.    Result canceled  by the ancillary.     12/01/2017  <0.00 log copies/mL Final     Comment:     <1.6 log   09/01/2017 <0.00 log copies/mL Final     Comment:     <1.6 log   04/02/2017  <0.00 log copies/mL Final     Comment:     <1.6 log   09/16/2016  <0.00 log copies/mL Final     Comment:     <1.6 log         Absolute CD4 Count   Date Value Ref Range Status   10/10/2019 693 510-2,320 /uL Final   09/01/2017 417 (L) 510-2,320 /uL Final   04/02/2017 290 (L) 510-2,320 /uL Final   09/16/2016 547 510 - 2,320 /uL Final   08/01/2014 514 510 - 2,320 /uL Final   01/03/2014 430 (L) 510 - 2,320 /uL Final   11/23/2012 549 510 - 2,320 /uL Final   07/20/2012 470 (L) 510 - 2,320 CELLS/UL Final     HIV RNA   Date Value Ref Range Status   05/16/2019 <40 copies/mL Final     Comment:     HIV-1 RNA not detected  The reportable range for this assay is 40 to 10,000,000  copies HIV-1 RNA/mL.     09/12/2018 <40 copies/mL Final     Comment:     HIV-1 RNA not detected  The reportable range for this assay is 40 to 10,000,000  copies HIV-1 RNA/mL.     12/01/2017 <40 (H) <0 copies/mL Final   09/01/2017 <40 (H) <0 copies/mL Final   04/02/2017 <40 (H) <0 copies/mL Final   09/16/2016 <40 (H) <0 copies/mL Final      Lab Results   Component Value Date    WBC 10.1 10/10/2019    WBC 9.8 05/16/2019    HGB 13.4 (L) 10/10/2019    HGB 14.3 05/16/2019    Platelet 134 (L) 10/10/2019    Platelet 149 (L) 05/16/2019    Creatinine 2.85 (H) 10/10/2019    Creatinine 2.30 (H) 07/31/2019    AST 22 10/10/2019    AST 31 05/16/2019    ALT 34 10/10/2019    ALT 37 05/16/2019    Triglycerides 198 (H) 09/16/2016    Triglycerides 80 01/03/2014    HDL 32 (L) 09/16/2016    HDL 53 01/03/2014    LDL Calculated 39 (L) 09/16/2016    LDL Cholesterol, Calculated 71 01/03/2014       Immunization History   Administered Date(s) Administered   ??? Hepatitis B, Adult 04/06/2016, 05/21/2016, 08/24/2016   ??? INFLUENZA TIV (TRI) PF (IM) 02/08/2008, 03/26/2010, 03/25/2011   ??? Influenza Vaccine Quad (IIV4 PF) 32mo+ injectable 07/24/2015, 02/05/2016, 03/08/2018   ??? Influenza Virus Vaccine, unspecified formulation 06/15/2014, 07/24/2015, 02/05/2016, 03/15/2016, 03/31/2017   ??? PNEUMOCOCCAL POLYSACCHARIDE 23 07/13/2000, 08/19/2005, 12/01/2017   ??? PPD Test 12/29/2006, 07/11/2008, 03/26/2010, 03/25/2011   ??? Pneumococcal Conjugate 13-Valent 04/13/2012   ??? SHINGRIX-ZOSTER VACCINE (HZV), RECOMBINANT,SUB-UNIT,ADJUVANTED IM 09/16/2016, 12/01/2017   ??? TdaP 08/31/2007   ??? Tuberculin Skin Test;unspecified Formulation 12/29/2006, 07/11/2008, 03/26/2010, 03/25/2011

## 2019-10-31 ENCOUNTER — Ambulatory Visit: Admit: 2019-10-31 | Discharge: 2019-11-01 | Payer: MEDICARE | Attending: Nephrology | Primary: Nephrology

## 2019-10-31 DIAGNOSIS — I2510 Atherosclerotic heart disease of native coronary artery without angina pectoris: Principal | ICD-10-CM

## 2019-10-31 DIAGNOSIS — I5033 Acute on chronic diastolic (congestive) heart failure: Principal | ICD-10-CM

## 2019-10-31 DIAGNOSIS — N1832 Stage 3b chronic kidney disease: Principal | ICD-10-CM

## 2019-10-31 DIAGNOSIS — N184 Chronic kidney disease, stage 4 (severe): Principal | ICD-10-CM

## 2019-10-31 DIAGNOSIS — B20 Human immunodeficiency virus [HIV] disease: Principal | ICD-10-CM

## 2019-10-31 DIAGNOSIS — K7469 Other cirrhosis of liver: Principal | ICD-10-CM

## 2019-10-31 LAB — FREE T4: Thyroxine.free:MCnc:Pt:Ser/Plas:Qn:: 0.85 — ABNORMAL LOW

## 2019-10-31 LAB — RENAL FUNCTION PANEL
ALBUMIN: 4.3 g/dL (ref 3.4–5.0)
ANION GAP: 8 mmol/L (ref 3–11)
BUN / CREAT RATIO: 10
CALCIUM: 10.7 mg/dL — ABNORMAL HIGH (ref 8.7–10.4)
CHLORIDE: 103 mmol/L (ref 98–107)
CO2: 22.7 mmol/L (ref 20.0–31.0)
CREATININE: 4.01 mg/dL — ABNORMAL HIGH (ref 0.60–1.10)
EGFR CKD-EPI AA MALE: 17 mL/min/{1.73_m2}
EGFR CKD-EPI NON-AA MALE: 14 mL/min/{1.73_m2}
GLUCOSE RANDOM: 145 mg/dL (ref 70–179)
PHOSPHORUS: 4.4 mg/dL (ref 2.4–5.1)
POTASSIUM: 4.5 mmol/L (ref 3.5–5.1)
SODIUM: 134 mmol/L — ABNORMAL LOW (ref 135–145)

## 2019-10-31 LAB — PARATHYROID HORMONE INTACT: Parathyrin.intact:MCnc:Pt:Ser/Plas:Qn:: 66.7

## 2019-10-31 LAB — MYELOMA SERUM CHEMISTRIES
GAMMAGLOBULIN; IGA: 348.3 mg/dL (ref 40.0–400.0)
GAMMAGLOBULIN; IGG: 1305 mg/dL (ref 600–1700)

## 2019-10-31 LAB — GAMMAGLOBULIN; IGM: IgM:MCnc:Pt:Ser/Plas:Qn:: 59

## 2019-10-31 LAB — MAGNESIUM: Magnesium:MCnc:Pt:Ser/Plas:Qn:: 2.8 — ABNORMAL HIGH

## 2019-10-31 LAB — ANION GAP: Anion gap 3:SCnc:Pt:Ser/Plas:Qn:: 8

## 2019-10-31 LAB — THYROID STIMULATING HORMONE: Chemistry studies:Cmplx:-:^Patient:Set:: 0.367 — ABNORMAL LOW

## 2019-10-31 NOTE — Unmapped (Signed)
1. Laboratory studies today     2. I will call you with the results once they all return.    3. Follow up in about 6-8 weeks.

## 2019-10-31 NOTE — Unmapped (Signed)
AOBP   Patient's blood pressure was taken on right upper arm, medium cuff.   1st reading 121/69, pulse: 57  2nd reading 116/69, pulse: 56  3rd reading 117/68, pulse: 57  Average reading 118/69, pulse: 57     Patient labs drawn in room prior to leaving Nephrology clinic.

## 2019-11-01 ENCOUNTER — Institutional Professional Consult (permissible substitution)
Admit: 2019-11-01 | Discharge: 2019-11-02 | Payer: MEDICARE | Attending: Infectious Disease | Primary: Infectious Disease

## 2019-11-01 DIAGNOSIS — B20 Human immunodeficiency virus [HIV] disease: Principal | ICD-10-CM

## 2019-11-01 DIAGNOSIS — K7460 Unspecified cirrhosis of liver: Principal | ICD-10-CM

## 2019-11-01 LAB — MYELOMA WORKUP, SERUM
ALPHA-2 GLOBULIN: 1 g/dL (ref 0.5–1.1)
BETA-1 GLOBULIN: 0.5 g/dL (ref 0.3–0.6)
BETA-2 GLOBULIN: 0.5 g/dL (ref 0.2–0.6)
GAMMAGLOBULIN: 1.2 g/dL (ref 0.5–1.5)
PROTEIN TOTAL: 8.1 g/dL (ref 6.5–8.3)

## 2019-11-01 LAB — ALPHA-1 GLOBULIN: Lab: 0.3

## 2019-11-01 LAB — IMMUNOGLOBULIN FREE LT CHAINS BLOOD: KAPPA FREE,SERUM: 8.87 mg/dL — ABNORMAL HIGH (ref 0.33–1.94)

## 2019-11-01 LAB — KAPPA FREE,SERUM: Lab: 8.87 — ABNORMAL HIGH

## 2019-11-01 MED ORDER — DOLUTEGRAVIR 50 MG TABLET
ORAL_TABLET | Freq: Every day | ORAL | 11 refills | 30 days | Status: CP
Start: 2019-11-01 — End: 2020-10-31
  Filled 2019-11-17: qty 30, 30d supply, fill #0

## 2019-11-01 MED ORDER — EMTRICITABINE 200 MG-TENOFOVIR ALAFENAMIDE FUMARATE 25 MG TABLET
ORAL_TABLET | Freq: Every day | ORAL | 11 refills | 30 days | Status: CP
Start: 2019-11-01 — End: 2020-10-31

## 2019-11-01 NOTE — Unmapped (Signed)
Addended byJackelyn Poling on: 11/01/2019 09:24 AM     Modules accepted: Orders, Level of Service

## 2019-11-01 NOTE — Unmapped (Signed)
Assessment/Plan:            .   HIV:  Doing well with undetectable HIV VL on 10/10/2019 on current ART (Descovy/dolutegravir), which he tolerates well. I reviewed chart in anticipation of today's visit. We discussed labs.  Will continue current regimen. Cirrhosis and renal insufficiency continue to be his biggest current medical problems.      Obesity: Nutrition counseling requested.    S/p L shoulder surgery:  Followed by his PCP.    Cirrhosis: Followed by GI. I requested f/u with [facility] and abdominal US for liver cancer screening.    SVR following Zepatier for Chronic HCV Infection (genotype 1a): Stable.    CKD: Further progression to Stage G4:A2-- Followed by Dr Stefano Gaul, who has referred him to Transplant Hepatology, given concern for worsened GFR in setting of cirrhosis.    HFpEF: Followed by Cardiology.    HTN: Stable.    Sexual Health:     Mental Health: Stable.    Health maintenance:   Reports COVID vaccine x 2 at [facility] at least 2 mos ago (~07/2019)  Pap N/A  RPR False + (1:2 with neg FTA 11/12/2015)  GC/CT - neg 08/26/2016  Hb A1C 11/12/2015: 5.7   Cholesterol 11/12/2015: total cholesterol 148, HDL 37, LDL (calc) 80, triglycerides 155  TB screening neg, 06/10/2016  Lung cancer screening N/A (quit 1993 after 1 ppd x 10 yrs)  Colonoscopy - Requested for screening    Pneumococcal and second Shingrix vaccine requested today.        Prevention, adherence and health education:   .  .  .  .  .  .  .    Educational and counseling services took 25  minutes of today's visit.    Follow-up:  Return to clinic in 1 month or sooner if needed.   Subjective:    Samuel [name] is a 69 y.o. male who presents to the Infectious Disease clinic for return HIV visit.     Chief Complaint: No chief complaint on file.      HPI:   HIV diagnosed in 12/1999. In 07/13/00, CD4 count was 547 (25%) with a viral load of about 5293. For a number of years, his viral load and CD4 count were generally reasonably well maintained in the absence of antiretroviral therapy, and he never had any HIV-related symptoms. However, during 2008-2009, his viral load dramatically increased and his CD4 count fell. Viral load reached a height of 309,000 on 08/31/2007 and CD4 count fell substantially to 244 on 09/29/2007. He had always been reluctant to take antiviral medications and in fact consistently and repeatedly refused hepatitis C treatment as well as HIV treatment in the past. However, because of the deterioration of his CD4 count and viral load, I prescribed Atripla on 10/12/07. Although he agreed to take it he did not done so until more than a year later, despite assurances during each visit that he would begin ART immediately. Once he finally started ART, however, he tolerated it quite well. On 07/24/2015 he was switched from Atripla to Descovy/dolutegravir b/o CKD.     I spent 23 minutes on the real-time audio and video with the patient on the date of service. I spent an additional 30 minutes on pre- and post-visit activities on the date of service.     The patient was physically located in [place] or a state in which I am permitted to provide care. The patient and/or parent/guardian understood that s/he may incur  co-pays and cost sharing, and agreed to the telemedicine visit. The visit was reasonable and appropriate under the circumstances given the patient's presentation at the time.    The patient and/or parent/guardian has been advised of the potential risks and limitations of this mode of treatment (including, but not limited to, the absence of in-person examination) and has agreed to be treated using telemedicine. The patient's/patient's family's questions regarding telemedicine have been answered.     If the visit was completed in an ambulatory setting, the patient and/or parent/guardian has also been advised to contact their provider???s office for worsening conditions, and seek emergency medical treatment and/or call 911 if the patient deems either necessary.    His HIV has been well controlled, but he has multiple severe comorbidities, including HCV (SVR after 12 wks of Zepatier 04/20/2016-07/13/2016; HCV RNA BDL 10/28/2016), resultant cirrhosis (Metavir F4), CKD previously Stage 3, NSTEMI 10/2015. He is followed closely by Nephrology. Also followed by GI, and Cardiology. Last seen by Dr Stefano Gaul (Nephrology) on 10/10/2019; CKD has further progressed to stage G4:A2. Hyperglycemia noted as well with A1C of 6.8 (followed by Dr Marcello Fennel).     Reports that diuretic given by Dr Stefano Gaul worked well and his leg swelling has markedly decreased.     Has missed no ART doses since I last saw him. Generally feels well.    ROS otherwise unremarkable.         Past Medical History:   Diagnosis Date   ??? CKD (chronic kidney disease) stage 3, GFR 30-59 ml/min 01/08/2015   ??? Coronary artery disease 10/2015    Multivessel disease, plan medical management after cath 6/17   ??? GERD (gastroesophageal reflux disease)    ??? Gout    ??? HIV (human immunodeficiency virus infection) (CMS-HCC)     Undectable viral load 5/17   ??? Hypertension    ??? Nephrolithiasis    ??? Nephrotic syndrome 03/05/2014   ??? Renal mass     Followed by neprhology, MRI 8/16 improved, felt to be benign       Medications:  Current Outpatient Medications   Medication Sig Dispense Refill   ??? albuterol (PROVENTIL HFA;VENTOLIN HFA) 90 mcg/actuation inhaler Inhale 2 puffs.     ??? allopurinoL (ZYLOPRIM) 100 MG tablet Take 250 mg by mouth.     ??? amLODIPine (NORVASC) 2.5 MG tablet Take 3 tablets (7.5 mg total) by mouth every morning. 90 tablet 3   ??? aspirin (ECOTRIN) 81 MG tablet   0   ??? atenoloL (TENORMIN) 100 MG tablet Take 1 tablet (100 mg total) by mouth daily. 90 tablet 3   ??? atorvastatin (LIPITOR) 20 MG tablet TAKE 1 TABLET (20 MG) BY MOUTH DAILY 90 tablet 3   ??? blood sugar diagnostic (ONETOUCH VERIO TEST STRIPS) Strp Use 2 (two) times daily E11.22     ??? calcium carbonate-vitamin D2 500 mg(1,250mg ) -200 unit tablet Take 1 tablet by mouth Two (2) times a day.     ??? cloNIDine HCL (CATAPRES) 0.1 MG tablet Take 1 tablet (0.1 mg total) by mouth 3 (three) times a day. 270 tablet 3   ??? cranberry 400 mg cap Take 400 mg by mouth daily.     ??? cyanocobalamin 1000 MCG tablet Take 1,000 mcg by mouth.     ??? cysteamine bitartrate (PROCYSBI) 300 mg grdp 1 each.     ??? diazepam (VALIUM) 5 MG tablet Take 5 mg by mouth nightly.      ??? dicyclomine (BENTYL) 10 mg capsule  Take 10 mg by mouth two (2) times a day as needed.      ??? docusate sodium (COLACE) 100 MG capsule Take 1 capsule (100 mg total) by mouth every twelve (12) hours. 60 capsule 0   ??? dolutegravir (TIVICAY) 50 mg Tab TABLET Take 1 tablet by mouth daily 30 tablet 11   ??? donepeziL (ARICEPT) 10 MG tablet Take 10 mg by mouth.     ??? dorzolamide-timoloL (COSOPT) 22.3-6.8 mg/mL ophthalmic solution INSTRILL 1 DROP INTO BOTH EYES EACH MORNING     ??? doxazosin (CARDURA) 4 MG tablet Take 1 tablet (4 mg total) by mouth nightly. 90 tablet 3   ??? emtricitabine-tenofovir alafen (DESCOVY) 200-25 mg tablet Take 1 tablet by mouth daily 30 tablet 11   ??? ferrous sulfate 325 (65 FE) MG tablet Take 325 mg by mouth daily with breakfast.      ??? folic acid (FOLVITE) 1 MG tablet   0   ??? gabapentin (NEURONTIN) 100 MG capsule      ??? gabapentin (NEURONTIN) 300 MG capsule TAKE 1 CAPSULE BY MOUTH EVERY NIGHT AT BEDTIME FOR 4 DAYS THEN 1 CAPSULE TWICE DAILY     ??? glimepiride (AMARYL) 4 MG tablet Take 1 tablet (4 mg total) by mouth daily. 90 tablet 3   ??? hydrALAZINE (APRESOLINE) 100 MG tablet Take 1 tablet (100 mg total) by mouth Two (2) times a day. 180 tablet 3   ??? HYDROcodone-acetaminophen (NORCO) 5-325 mg per tablet Directions are take one tablet 30 minutes before physical therapy, then take one tablet at bedtime as needed for pain     ??? lisinopriL (PRINIVIL,ZESTRIL) 40 MG tablet Take 1 tablet (40 mg total) by mouth daily. 90 tablet 3   ??? meclizine (ANTIVERT) 25 mg tablet Take 25 mg by mouth Three (3) times a day as needed.      ??? meTOLAzone (ZAROXOLYN) 10 MG tablet Take 1 tablet (10 mg total) by mouth daily. 90 tablet 3   ??? nitroglycerin (NITROSTAT) 0.4 MG SL tablet DISSOLVE ONE TABLET UNDER TONGUE AS NEEDED FOR CHEST PAIN EVERY 5 MINUTES AS DIRECTED 25 tablet 0   ??? nystatin (MYCOSTATIN) 100,000 unit/mL suspension Take 5 mL by mouth daily.      ??? omega-3 fatty acids-fish oil 340-1,000 mg capsule Take 1 capsule by mouth.     ??? ondansetron (ZOFRAN) 4 MG tablet take 1 tablet by mouth every 8 hours if needed for nausea     ??? oxyCODONE-acetaminophen (PERCOCET) 5-325 mg per tablet Take 1 tablet by mouth.     ??? ROCKLATAN 0.02-0.005 % Drop INT 1 GTT INTO OU QHS     ??? sertraline (ZOLOFT) 50 MG tablet take 1 tablet by mouth once daily     ??? SITagliptin (JANUVIA) 50 MG tablet Take 1 tablet (50 mg total) by mouth daily. 90 tablet 3   ??? spironolactone (ALDACTONE) 25 MG tablet Take 1 tablet (25 mg total) by mouth daily. 90 tablet 3   ??? tamsulosin (FLOMAX) 0.4 mg capsule TAKE 1 CAPSULE(0.4 MG) BY MOUTH DAILY 90 capsule 3   ??? torsemide (DEMADEX) 20 MG tablet Take 2 tablets (40 mg total) by mouth daily. 90 tablet 3     No current facility-administered medications for this visit.       Allergies: Colchicine analogues and Tramadol    Social History:  As per MEDICAL RECORD NUMBERNo cigs or alcohol at all. No current IDU (quit many years ago). Lives with wife.     Review  of Systems:  A 12 point review of systems was negative except for pertinent items noted in the HPI.    Objective:       There were no vitals taken for this visit.    GEN:  looks well, no apparent distress  PSYCH:attentive, appropriate affect, good eye contact, fluent speech    Recent Labs:    Lab Results   Component Value Date    RPR Nonreactive 06/08/2018    RPR Reactive (A) 11/12/2015    GCNAA Negative 06/08/2018    CTNAA Negative 06/08/2018    A1C 6.8 (H) 06/08/2018    A1C 5.7 11/12/2015    CHOL 111 09/16/2016    CHOL 148 11/12/2015    CHOL 120 07/24/2015    LDL 39 (L) 09/16/2016    LDL 80 11/12/2015    LDL 46 (L) 07/24/2015    HDL 32 (L) 09/16/2016    HDL 37 (L) 11/12/2015    HDL 52 07/24/2015    TRIG 098 (H) 09/16/2016    TRIG 155 (H) 11/12/2015    TRIG 111 07/24/2015    CHOLHDLRATIO 3.5 09/16/2016    CHOLHDLRATIO 4.0 11/12/2015    CHOLHDLRATIO 2.3 07/24/2015    QFTTBGOLD Negative 06/08/2018         Absolute CD4 Count   Date Value Ref Range Status   10/10/2019 693 510-2,320 /uL Final   09/01/2017 417 (L) 510-2,320 /uL Final   04/02/2017 290 (L) 510-2,320 /uL Final   09/16/2016 547 510 - 2,320 /uL Final   08/01/2014 514 510 - 2,320 /uL Final   01/03/2014 430 (L) 510 - 2,320 /uL Final   11/23/2012 549 510 - 2,320 /uL Final   07/20/2012 470 (L) 510 - 2,320 CELLS/UL Final     CD4% (T Helper)   Date Value Ref Range Status   10/10/2019 33 (L) 34 - 58 % Final   09/01/2017 27 (L) 34 - 58 % Final   04/02/2017 29 (L) 34 - 58 % Final   09/16/2016 28 (L) 34 - 58 % Final   08/01/2014 35 34 - 58 % Final   01/03/2014 34 34 - 58 % Final   11/23/2012 31 (L) 34 - 58 % Final   07/20/2012 29 (L) 34 - 58 % OF LYMPHS Final     HIV RNA Quant Result   Date Value Ref Range Status   10/10/2019 Not Detected Not Detected Final   06/08/2018 Not Detected Not Detected Final   12/01/2017 Detected (A) Not Detected Final   09/01/2017 Detected (A) Not Detected Final   08/15/2014 Not Detected  Final   01/03/2014 Not Detected  Final   11/23/2012 Not Detected  Final   07/20/2012 Not Detected  Final     HIV RNA   Date Value Ref Range Status   05/16/2019 <40 copies/mL Final     Comment:     HIV-1 RNA not detected  The reportable range for this assay is 40 to 10,000,000  copies HIV-1 RNA/mL.     09/12/2018 <40 copies/mL Final     Comment:     HIV-1 RNA not detected  The reportable range for this assay is 40 to 10,000,000  copies HIV-1 RNA/mL.     12/01/2017 <40 (H) <0 copies/mL Final   09/01/2017 <40 (H) <0 copies/mL Final   04/02/2017 <40 (H) <0 copies/mL Final   09/16/2016 <40 (H) <0 copies/mL Final     HIV RNA Log(10)   Date Value Ref Range Status  05/16/2019 CANCELED log10copy/mL      Comment:     Unable to calculate result since non-numeric result obtained for  component test.    Result canceled by the ancillary.     09/12/2018 CANCELED log10copy/mL      Comment:     Unable to calculate result since non-numeric result obtained for  component test.    Result canceled by the ancillary.     12/01/2017  <0.00 log copies/mL Final     Comment:     <1.6 log   09/01/2017  <0.00 log copies/mL Final     Comment:     <1.6 log   04/02/2017  <0.00 log copies/mL Final     Comment:     <1.6 log   09/16/2016  <0.00 log copies/mL Final     Comment:     <1.6 log         Absolute CD4 Count   Date Value Ref Range Status   10/10/2019 693 510-2,320 /uL Final   09/01/2017 417 (L) 510-2,320 /uL Final   04/02/2017 290 (L) 510-2,320 /uL Final   09/16/2016 547 510 - 2,320 /uL Final   08/01/2014 514 510 - 2,320 /uL Final   01/03/2014 430 (L) 510 - 2,320 /uL Final   11/23/2012 549 510 - 2,320 /uL Final   07/20/2012 470 (L) 510 - 2,320 CELLS/UL Final     HIV RNA   Date Value Ref Range Status   05/16/2019 <40 copies/mL Final     Comment:     HIV-1 RNA not detected  The reportable range for this assay is 40 to 10,000,000  copies HIV-1 RNA/mL.     09/12/2018 <40 copies/mL Final     Comment:     HIV-1 RNA not detected  The reportable range for this assay is 40 to 10,000,000  copies HIV-1 RNA/mL.     12/01/2017 <40 (H) <0 copies/mL Final   09/01/2017 <40 (H) <0 copies/mL Final   04/02/2017 <40 (H) <0 copies/mL Final   09/16/2016 <40 (H) <0 copies/mL Final      Lab Results   Component Value Date    WBC 10.1 10/10/2019    WBC 9.8 05/16/2019    HGB 13.4 (L) 10/10/2019    HGB 14.3 05/16/2019    Platelet 134 (L) 10/10/2019    Platelet 149 (L) 05/16/2019    Creatinine 2.85 (H) 10/10/2019    Creatinine 2.30 (H) 07/31/2019    AST 22 10/10/2019    AST 31 05/16/2019    ALT 34 10/10/2019    ALT 37 05/16/2019    Triglycerides 198 (H) 09/16/2016    Triglycerides 80 01/03/2014    HDL 32 (L) 09/16/2016    HDL 53 01/03/2014    LDL Calculated 39 (L) 09/16/2016    LDL Cholesterol, Calculated 71 01/03/2014       Immunization History   Administered Date(s) Administered   ??? Hepatitis B, Adult 04/06/2016, 05/21/2016, 08/24/2016   ??? INFLUENZA TIV (TRI) PF (IM) 02/08/2008, 03/26/2010, 03/25/2011   ??? Influenza Vaccine Quad (IIV4 PF) 22mo+ injectable 07/24/2015, 02/05/2016, 03/08/2018   ??? Influenza Virus Vaccine, unspecified formulation 06/15/2014, 07/24/2015, 02/05/2016, 03/15/2016, 03/31/2017   ??? PNEUMOCOCCAL POLYSACCHARIDE 23 07/13/2000, 08/19/2005, 12/01/2017   ??? PPD Test 12/29/2006, 07/11/2008, 03/26/2010, 03/25/2011   ??? Pneumococcal Conjugate 13-Valent 04/13/2012   ??? SHINGRIX-ZOSTER VACCINE (HZV), RECOMBINANT,SUB-UNIT,ADJUVANTED IM 09/16/2016, 12/01/2017   ??? TdaP 08/31/2007   ??? Tuberculin Skin Test;unspecified Formulation 12/29/2006, 07/11/2008, 03/26/2010, 03/25/2011

## 2019-11-02 NOTE — Unmapped (Signed)
Addended byJackelyn Poling on: 11/02/2019 02:33 PM     Modules accepted: Orders

## 2019-11-06 DIAGNOSIS — N184 Chronic kidney disease, stage 4 (severe): Principal | ICD-10-CM

## 2019-11-06 LAB — VITAMIN D 1,25-DIHYDROXY: 1,25-Dihydroxyvitamin D:MCnc:Pt:Ser/Plas:Qn:: 25

## 2019-11-13 ENCOUNTER — Inpatient Hospital Stay (HOSPITAL_BASED_OUTPATIENT_CLINIC_OR_DEPARTMENT_OTHER): Payer: Medicare Other | Admitting: Internal Medicine

## 2019-11-13 ENCOUNTER — Other Ambulatory Visit: Payer: Self-pay

## 2019-11-13 ENCOUNTER — Inpatient Hospital Stay: Payer: Medicare Other | Attending: Internal Medicine

## 2019-11-13 DIAGNOSIS — N1832 Chronic kidney disease, stage 3b: Secondary | ICD-10-CM | POA: Diagnosis not present

## 2019-11-13 DIAGNOSIS — D509 Iron deficiency anemia, unspecified: Secondary | ICD-10-CM | POA: Insufficient documentation

## 2019-11-13 DIAGNOSIS — I350 Nonrheumatic aortic (valve) stenosis: Secondary | ICD-10-CM | POA: Insufficient documentation

## 2019-11-13 DIAGNOSIS — D693 Immune thrombocytopenic purpura: Secondary | ICD-10-CM | POA: Diagnosis not present

## 2019-11-13 DIAGNOSIS — I2510 Atherosclerotic heart disease of native coronary artery without angina pectoris: Secondary | ICD-10-CM | POA: Diagnosis not present

## 2019-11-13 DIAGNOSIS — I509 Heart failure, unspecified: Secondary | ICD-10-CM | POA: Diagnosis not present

## 2019-11-13 DIAGNOSIS — N184 Chronic kidney disease, stage 4 (severe): Secondary | ICD-10-CM | POA: Insufficient documentation

## 2019-11-13 DIAGNOSIS — I252 Old myocardial infarction: Secondary | ICD-10-CM | POA: Diagnosis not present

## 2019-11-13 DIAGNOSIS — Z87891 Personal history of nicotine dependence: Secondary | ICD-10-CM | POA: Insufficient documentation

## 2019-11-13 DIAGNOSIS — Z87442 Personal history of urinary calculi: Secondary | ICD-10-CM | POA: Insufficient documentation

## 2019-11-13 DIAGNOSIS — F329 Major depressive disorder, single episode, unspecified: Secondary | ICD-10-CM | POA: Insufficient documentation

## 2019-11-13 DIAGNOSIS — M109 Gout, unspecified: Secondary | ICD-10-CM | POA: Diagnosis not present

## 2019-11-13 DIAGNOSIS — D631 Anemia in chronic kidney disease: Secondary | ICD-10-CM | POA: Diagnosis not present

## 2019-11-13 DIAGNOSIS — I130 Hypertensive heart and chronic kidney disease with heart failure and stage 1 through stage 4 chronic kidney disease, or unspecified chronic kidney disease: Secondary | ICD-10-CM | POA: Insufficient documentation

## 2019-11-13 DIAGNOSIS — E1122 Type 2 diabetes mellitus with diabetic chronic kidney disease: Secondary | ICD-10-CM | POA: Insufficient documentation

## 2019-11-13 DIAGNOSIS — K219 Gastro-esophageal reflux disease without esophagitis: Secondary | ICD-10-CM | POA: Diagnosis not present

## 2019-11-13 DIAGNOSIS — Z79899 Other long term (current) drug therapy: Secondary | ICD-10-CM | POA: Diagnosis not present

## 2019-11-13 DIAGNOSIS — B20 Human immunodeficiency virus [HIV] disease: Secondary | ICD-10-CM | POA: Diagnosis not present

## 2019-11-13 DIAGNOSIS — Z7982 Long term (current) use of aspirin: Secondary | ICD-10-CM | POA: Diagnosis not present

## 2019-11-13 LAB — COMPREHENSIVE METABOLIC PANEL
ALT: 41 U/L (ref 0–44)
AST: 28 U/L (ref 15–41)
Albumin: 4.3 g/dL (ref 3.5–5.0)
Alkaline Phosphatase: 59 U/L (ref 38–126)
Anion gap: 12 (ref 5–15)
BUN: 57 mg/dL — ABNORMAL HIGH (ref 8–23)
CO2: 29 mmol/L (ref 22–32)
Calcium: 10.2 mg/dL (ref 8.9–10.3)
Chloride: 95 mmol/L — ABNORMAL LOW (ref 98–111)
Creatinine, Ser: 4.29 mg/dL — ABNORMAL HIGH (ref 0.61–1.24)
GFR calc Af Amer: 15 mL/min — ABNORMAL LOW (ref >60)
GFR calc non Af Amer: 13 mL/min — ABNORMAL LOW (ref >60)
Glucose, Bld: 159 mg/dL — ABNORMAL HIGH (ref 70–99)
Potassium: 4.8 mmol/L (ref 3.5–5.1)
Sodium: 136 mmol/L (ref 135–145)
Total Bilirubin: 1 mg/dL (ref 0.3–1.2)
Total Protein: 8.3 g/dL — ABNORMAL HIGH (ref 6.5–8.1)

## 2019-11-13 LAB — CBC WITH DIFFERENTIAL/PLATELET
Abs Immature Granulocytes: 0.03 10*3/uL (ref 0.00–0.07)
Basophils Absolute: 0 10*3/uL (ref 0.0–0.1)
Basophils Relative: 0 %
Eosinophils Absolute: 0.1 10*3/uL (ref 0.0–0.5)
Eosinophils Relative: 2 %
HCT: 39.5 % (ref 39.0–52.0)
Hemoglobin: 13.5 g/dL (ref 13.0–17.0)
Immature Granulocytes: 0 %
Lymphocytes Relative: 25 %
Lymphs Abs: 2.1 10*3/uL (ref 0.7–4.0)
MCH: 31.2 pg (ref 26.0–34.0)
MCHC: 34.2 g/dL (ref 30.0–36.0)
MCV: 91.2 fL (ref 80.0–100.0)
Monocytes Absolute: 1.1 10*3/uL — ABNORMAL HIGH (ref 0.1–1.0)
Monocytes Relative: 13 %
Neutro Abs: 4.9 10*3/uL (ref 1.7–7.7)
Neutrophils Relative %: 60 %
Platelets: 127 10*3/uL — ABNORMAL LOW (ref 150–400)
RBC: 4.33 MIL/uL (ref 4.22–5.81)
RDW: 12.7 % (ref 11.5–15.5)
WBC: 8.4 10*3/uL (ref 4.0–10.5)
nRBC: 0 % (ref 0.0–0.2)

## 2019-11-13 LAB — FERRITIN: Ferritin: 104 ng/mL (ref 24–336)

## 2019-11-13 LAB — IRON AND TIBC
Iron: 47 ug/dL (ref 45–182)
Saturation Ratios: 13 % — ABNORMAL LOW (ref 17.9–39.5)
TIBC: 377 ug/dL (ref 250–450)
UIBC: 330 ug/dL

## 2019-11-13 NOTE — Assessment & Plan Note (Addendum)
#  Iron deficient anemia secondary to CKD stage III-on p.o. iron once a day.   # Today hemoglobin 13.2;  Iron saturation 13%%; ferritin-20. recommend compliance  p.o. iron.   # CKD stage IV-GFR- 15 [Dr.Hladik; [facility]]; on lasix; followed closely; ? Awaiting kidney transplant as per nephology.   #HIV/Hep C [s/p anti-viral]-under good control as per patient; on retrovirals. STABLE.  # Intermittent mild thrombocytopenia platelets 127- ? ITP- STABLE; monitor for now.   # DISPOSITION: # Recommend follow-up in 6 months -MD/labs-cbc/cmp/iron studoes/ferritin-Dr.B

## 2019-11-13 NOTE — Progress Notes (Signed)
[place] NOTE  Patient Care Team: Tracie Harrier, MD as PCP - General (Internal Medicine)  CHIEF COMPLAINTS/PURPOSE OF CONSULTATION:   # SEVERE IRON DEFICIENCY ANEMIA- [EGD Aug 2016-esophageal ulcer; colonoscopy-? 2001]; EGD- April 2017- Neg. UA- neg.   # HIV- Dr.Idarmore; [facility]/Hepatitis C [no treatment]  # CKD- stage IV [[facility]]  HISTORY OF PRESENTING ILLNESS:  Jared Walker 69 y.o.  male history of chronic kidney disease/ and the severe iron deficiency is here for follow-up.  No nausea no vomiting.  No abdominal pain.  Admits to compliance with his HIV medications.  No opportunistic infections.  Review of Systems  Constitutional: Negative for chills, diaphoresis, fever, malaise/fatigue and weight loss.  HENT: Negative for nosebleeds and sore throat.   Eyes: Negative for double vision.  Respiratory: Negative for cough, hemoptysis, sputum production, shortness of breath and wheezing.   Cardiovascular: Negative for chest pain, palpitations, orthopnea and leg swelling.  Gastrointestinal: Negative for abdominal pain, blood in stool, constipation, diarrhea, heartburn, melena, nausea and vomiting.  Genitourinary: Negative for dysuria, frequency and urgency.  Musculoskeletal: Negative for back pain and joint pain.  Skin: Negative.  Negative for itching and rash.  Neurological: Negative for dizziness, tingling, focal weakness, weakness and headaches.  Endo/Heme/Allergies: Does not bruise/bleed easily.  Psychiatric/Behavioral: Negative for depression. The patient is not nervous/anxious and does not have insomnia.      MEDICAL HISTORY:  Past Medical History:  Diagnosis Date  . Anemia   . Aortic stenosis, mild    mild AS by echo, 08/2011  . Cervical disc herniation   . CHF (congestive heart failure) ([place])   . Chronic kidney disease    kidney stones. chronic kidney disease, stage III. followed by doctor in [place]  . Cirrhosis ([place])    due to  hepatitis c not being treated for a long periiod of time  . Coronary artery disease   . Depression   . Diabetes mellitus without complication ([place]) 63/1497   A1C 10.0  . Full dentures    upper and lower  . GERD (gastroesophageal reflux disease)   . Gout   . Heart murmur    no treatment  . Hep C w/o coma, chronic ([place]) 01/19/2015   took treatment and it is now gone.   . Hepatitis C 09/25/11  . HIV positive ([place]) 09/25/2011   nondetectable.   . Hypertension   . MI (myocardial infarction) ([place]) 2017   NSTEMI  . Neuromuscular disorder (H[facility])    numbness in fingers most likely due to shoulder damage.    SURGICAL HISTORY: Past Surgical History:  Procedure Laterality Date  . BACK SURGERY  1985   lumbar laminectomy. metal in pelvis  . bulging disc Bilateral 1990   L4-L5  . CARDIOVASCULAR STRESS TEST     at [facility]  . CHOLECYSTECTOMY    . CLOSED REDUCTION PELVIC FRACTURE    . ESOPHAGOGASTRODUODENOSCOPY (EGD) WITH PROPOFOL N/A 01/21/2015   Procedure: ESOPHAGOGASTRODUODENOSCOPY (EGD) WITH PROPOFOL;  Surgeon: Manya Silvas, MD;  Location: [facility] ENDOSCOPY;  Service: Endoscopy;  Laterality: N/A;  . ESOPHAGOGASTRODUODENOSCOPY (EGD) WITH PROPOFOL N/A 09/04/2015   Procedure: ESOPHAGOGASTRODUODENOSCOPY (EGD) WITH PROPOFOL;  Surgeon: Manya Silvas, MD;  Location: [facility] ENDOSCOPY;  Service: Endoscopy;  Laterality: N/A;  . FRACTURE SURGERY Right    hand.  pins in hand from long ago  . KNEE ARTHROSCOPY  2009   Right  . LUMBAR LAMINECTOMY/DECOMPRESSION MICRODISCECTOMY  09/25/2011   Procedure: LUMBAR LAMINECTOMY/DECOMPRESSION MICRODISCECTOMY;  Surgeon: Broadus John  Vertell Limber, MD;  Location: [place] [facility] NEURO ORS;  Service: Neurosurgery;  Laterality: Left;  Left Lumbar Four-Five Microdiskectomy  . NEPHROSTOMY     tube Left  . SHOULDER ARTHROSCOPY WITH OPEN ROTATOR CUFF REPAIR Right 10/12/2014   Procedure: SHOULDER ARTHROSCOP with decompression;  Surgeon: Leanor Kail, MD;  Location: [facility];  Service:  Orthopedics;  Laterality: Right;  . SHOULDER ARTHROSCOPY WITH OPEN ROTATOR CUFF REPAIR Left 07/08/2017   Procedure: SHOULDER ARTHROSCOPY WITH OPEN ROTATOR CUFF REPAIR;  Surgeon: Corky Mull, MD;  Location: [facility] ORS;  Service: Orthopedics;  Laterality: Left;    SOCIAL HISTORY: Social History   Socioeconomic History  . Marital status: Married    Spouse name: Not on file  . Number of children: Not on file  . Years of education: Not on file  . Highest education level: Not on file  Occupational History  . Not on file  Tobacco Use  . Smoking status: Former Smoker    Packs/day: 0.50    Types: Cigarettes    Quit date: 06/1978    Years since quitting: 41.4  . Smokeless tobacco: Former Systems developer    Quit date: 03/13/1972  Vaping Use  . Vaping Use: Never used  Substance and Sexual Activity  . Alcohol use: No  . Drug use: No  . Sexual activity: Not on file  Other Topics Concern  . Not on file  Social History Narrative   Independent at baseline. Ambulates with a cane. Lives at home with his wife   Social Determinants of Health   Financial Resource Strain:   . Difficulty of Paying Living Expenses:   Food Insecurity:   . Worried About Charity fundraiser in the Last Year:   . Arboriculturist in the Last Year:   Transportation Needs:   . Film/video editor (Medical):   Marland Kitchen Lack of Transportation (Non-Medical):   Physical Activity:   . Days of Exercise per Week:   . Minutes of Exercise per Session:   Stress:   . Feeling of Stress :   Social Connections:   . Frequency of Communication with Friends and Family:   . Frequency of Social Gatherings with Friends and Family:   . Attends Religious Services:   . Active Member of Clubs or Organizations:   . Attends Archivist Meetings:   Marland Kitchen Marital Status:   Intimate Partner Violence:   . Fear of Current or Ex-Partner:   . Emotionally Abused:   Marland Kitchen Physically Abused:   . Sexually Abused:     FAMILY HISTORY: Family History   Problem Relation Age of Onset  . Hypertension Mother   . Cancer Father   . Heart disease Sister   . Diabetes Brother   . Anesthesia problems Neg Hx     ALLERGIES:  is allergic to oxycodone-acetaminophen, buchu-cornsilk-ch grass-hydran, colchicine, nifedipine, and tramadol.  MEDICATIONS:  Current Outpatient Medications  Medication Sig Dispense Refill  . albuterol (PROVENTIL HFA;VENTOLIN HFA) 108 (90 Base) MCG/ACT inhaler Inhale 1-2 puffs into the lungs every 6 (six) hours as needed for wheezing or shortness of breath.     . allopurinol (ZYLOPRIM) 100 MG tablet Take 300 mg by mouth daily.     Marland Kitchen amLODipine (NORVASC) 5 MG tablet Take 5 mg by mouth daily.     Marland Kitchen aspirin EC 81 MG EC tablet Take 1 tablet (81 mg total) by mouth daily. 120 tablet 0  . atorvastatin (LIPITOR) 80 MG tablet Take 80 mg by mouth daily.  Take in the morning.    . carvedilol (COREG) 12.5 MG tablet Take 12.5 mg by mouth 2 (two) times daily with a meal.    . Cholecalciferol (VITAMIN D-1000 MAX ST) 1000 units tablet Take 1,000 Units by mouth daily.    . cloNIDine (CATAPRES) 0.1 MG tablet Take 0.1 mg by mouth 3 (three) times daily.    . Cranberry 400 MG CAPS Take 400 mg by mouth daily.    . DESCOVY 200-25 MG tablet Take 1 tablet by mouth at bedtime.   3  . diazepam (VALIUM) 5 MG tablet Take 5 mg by mouth at bedtime.     . dolutegravir (TIVICAY) 50 MG tablet Take 50 mg by mouth at bedtime.     . ferrous sulfate 325 (65 FE) MG tablet Take 325 mg by mouth daily with breakfast.     . folic acid (FOLVITE) 1 MG tablet Take 1 mg by mouth daily. In the morning    . glimepiride (AMARYL) 2 MG tablet Take 4 mg by mouth daily with breakfast.     . HYDROcodone-acetaminophen (NORCO/VICODIN) 5-325 MG tablet Directions are take one tablet 30 minutes before physical therapy, then take one tablet at bedtime as needed for pain    . JANUVIA 50 MG tablet Take 50 mg by mouth daily.    Marland Kitchen losartan (COZAAR) 100 MG tablet Take 100 mg by mouth every  morning.   0  . nystatin (MYCOSTATIN) 100000 UNIT/ML suspension Take 5 mLs by mouth 4 (four) times daily as needed for mouth pain.    . Omega-3 Fatty Acids (FISH OIL PO) Take by mouth.    . ondansetron (ZOFRAN) 4 MG tablet TAKE 1 TABLET BY MOUTH EVERY 8 HOURS IF NEEDED FOR NAUSEA  0  . pantoprazole (PROTONIX) 40 MG tablet Take 1 tablet by mouth daily.    . sertraline (ZOLOFT) 50 MG tablet Take 50 mg by mouth every evening.     . tamsulosin (FLOMAX) 0.4 MG CAPS capsule Take 1 capsule by mouth daily.    Marland Kitchen torsemide (DEMADEX) 20 MG tablet Take 1 tablet (20 mg total) by mouth daily. 30 tablet 5  . donepezil (ARICEPT) 10 MG tablet Take 10 mg by mouth at bedtime.  (Patient not taking: Reported on 11/13/2019)     No current facility-administered medications for this visit.      Marland Kitchen  PHYSICAL EXAMINATION:   Vitals:   11/13/19 1344  BP: 138/88  Pulse: 79  Temp: (!) 97.1 F (36.2 C)   Filed Weights   11/13/19 1344  Weight: 220 lb 6 oz (100 kg)    Physical Exam Constitutional:      Comments: Is alone.  HENT:     Head: Normocephalic and atraumatic.     Mouth/Throat:     Pharynx: No oropharyngeal exudate.  Eyes:     Pupils: Pupils are equal, round, and reactive to light.  Cardiovascular:     Rate and Rhythm: Normal rate and regular rhythm.  Pulmonary:     Effort: No respiratory distress.     Breath sounds: No wheezing.  Abdominal:     General: Bowel sounds are normal. There is no distension.     Palpations: Abdomen is soft. There is no mass.     Tenderness: There is no abdominal tenderness. There is no guarding or rebound.  Musculoskeletal:        General: No tenderness. Normal range of motion.     Cervical back: Normal range of motion and  neck supple.  Skin:    General: Skin is warm.  Neurological:     Mental Status: He is alert and oriented to person, place, and time.  Psychiatric:        Mood and Affect: Affect normal.      LABORATORY DATA:  I have reviewed the data  as listed Lab Results  Component Value Date   WBC 8.4 11/13/2019   HGB 13.5 11/13/2019   HCT 39.5 11/13/2019   MCV 91.2 11/13/2019   PLT 127 (L) 11/13/2019   Recent Labs    05/18/19 0822 11/13/19 1310  NA 142 136  K 4.6 4.8  CL 107 95*  CO2 26 29  GLUCOSE 155* 159*  BUN 19 57*  CREATININE 2.39* 4.29*  CALCIUM 9.8 10.2  GFRNONAA 27* 13*  GFRAA 31* 15*  PROT 7.3 8.3*  ALBUMIN 4.0 4.3  AST 22 28  ALT 31 41  ALKPHOS 54 59  BILITOT 0.9 1.0     ASSESSMENT & PLAN:   Anemia due to stage 3b chronic kidney disease #Iron deficient anemia secondary to CKD stage III-on p.o. iron once a day.   # Today hemoglobin 13.2;  Iron saturation 13%%; ferritin-20. recommend compliance  p.o. iron.   # CKD stage IV-GFR- 15 [Dr.Hladik; [facility] nephrology]; on lasix; followed closely; ? Awaiting kidney transplant as per nephology.   #HIV/Hep C [s/p anti-viral]-under good control as per patient; on retrovirals. STABLE.  # Intermittent mild thrombocytopenia platelets 127- ? ITP- STABLE; monitor for now.   # DISPOSITION: # Recommend follow-up in 6 months -MD/labs-cbc/cmp/iron studoes/ferritin-Dr.B      Cammie Sickle, MD 11/20/2019 5:04 PM

## 2019-11-15 NOTE — Unmapped (Signed)
[facility] Clinical Assessment & Refill Coordination Note    Samuel [name], DOB: Mar 26, 1951  Phone: [phone] (home)     All above HIPAA information was verified with patient.     Was a Nurse, learning disability used for this call? No    Specialty Medication(s):   Infectious Disease: Descovy and Tivicay     Current Outpatient Medications   Medication Sig Dispense Refill   ??? albuterol (PROVENTIL HFA;VENTOLIN HFA) 90 mcg/actuation inhaler Inhale 2 puffs.     ??? allopurinoL (ZYLOPRIM) 100 MG tablet Take 250 mg by mouth.     ??? amLODIPine (NORVASC) 2.5 MG tablet Take 3 tablets (7.5 mg total) by mouth every morning. 90 tablet 3   ??? aspirin (ECOTRIN) 81 MG tablet   0   ??? atenoloL (TENORMIN) 100 MG tablet Take 1 tablet (100 mg total) by mouth daily. 90 tablet 3   ??? atorvastatin (LIPITOR) 20 MG tablet TAKE 1 TABLET (20 MG) BY MOUTH DAILY 90 tablet 3   ??? BACLOFEN, BULK, TOP Apply topically. Baclofen 2%, Diclofenac 5%, Gabapentin 6%, Tetracaine 3%     ??? blood sugar diagnostic (ONETOUCH VERIO TEST STRIPS) Strp Use 2 (two) times daily E11.22     ??? calcium carbonate-vitamin D2 500 mg(1,250mg ) -200 unit tablet Take 1 tablet by mouth Two (2) times a day.     ??? cloNIDine HCL (CATAPRES) 0.1 MG tablet Take 1 tablet (0.1 mg total) by mouth 3 (three) times a day. 270 tablet 3   ??? cranberry 400 mg cap Take 400 mg by mouth daily.     ??? cyanocobalamin 1000 MCG tablet Take 1,000 mcg by mouth.     ??? cysteamine bitartrate (PROCYSBI) 300 mg grdp 1 each.     ??? diazepam (VALIUM) 5 MG tablet Take 5 mg by mouth nightly.      ??? dicyclomine (BENTYL) 10 mg capsule Take 10 mg by mouth two (2) times a day as needed.      ??? docusate sodium (COLACE) 100 MG capsule Take 1 capsule (100 mg total) by mouth every twelve (12) hours. 60 capsule 0   ??? dolutegravir (TIVICAY) 50 mg TABLET Take 1 tablet by mouth daily 30 tablet 11   ??? donepeziL (ARICEPT) 10 MG tablet Take 10 mg by mouth.     ??? dorzolamide-timoloL (COSOPT) 22.3-6.8 mg/mL ophthalmic solution INSTRILL 1 DROP INTO BOTH EYES EACH MORNING     ??? doxazosin (CARDURA) 4 MG tablet Take 1 tablet (4 mg total) by mouth nightly. 90 tablet 3   ??? emtricitabine-tenofovir alafen (DESCOVY) 200-25 mg tablet Take 1 tablet by mouth daily 30 tablet 11   ??? ferrous sulfate 325 (65 FE) MG tablet Take 325 mg by mouth daily with breakfast.      ??? folic acid (FOLVITE) 1 MG tablet   0   ??? gabapentin (NEURONTIN) 100 MG capsule      ??? gabapentin (NEURONTIN) 300 MG capsule TAKE 1 CAPSULE BY MOUTH EVERY NIGHT AT BEDTIME FOR 4 DAYS THEN 1 CAPSULE TWICE DAILY     ??? glimepiride (AMARYL) 4 MG tablet Take 1 tablet (4 mg total) by mouth daily. 90 tablet 3   ??? hydrALAZINE (APRESOLINE) 100 MG tablet Take 1 tablet (100 mg total) by mouth Two (2) times a day. 180 tablet 3   ??? HYDROcodone-acetaminophen (NORCO) 5-325 mg per tablet Directions are take one tablet 30 minutes before physical therapy, then take one tablet at bedtime as needed for pain     ???  lisinopriL (PRINIVIL,ZESTRIL) 40 MG tablet Take 1 tablet (40 mg total) by mouth daily. 90 tablet 3   ??? meclizine (ANTIVERT) 25 mg tablet Take 25 mg by mouth Three (3) times a day as needed.      ??? meTOLAzone (ZAROXOLYN) 10 MG tablet Take 1 tablet (10 mg total) by mouth daily. 90 tablet 3   ??? nitroglycerin (NITROSTAT) 0.4 MG SL tablet DISSOLVE ONE TABLET UNDER TONGUE AS NEEDED FOR CHEST PAIN EVERY 5 MINUTES AS DIRECTED 25 tablet 0   ??? nystatin (MYCOSTATIN) 100,000 unit/mL suspension Take 5 mL by mouth daily.      ??? omega-3 fatty acids-fish oil 340-1,000 mg capsule Take 1 capsule by mouth.     ??? ondansetron (ZOFRAN) 4 MG tablet take 1 tablet by mouth every 8 hours if needed for nausea     ??? oxyCODONE-acetaminophen (PERCOCET) 5-325 mg per tablet Take 1 tablet by mouth.     ??? ROCKLATAN 0.02-0.005 % Drop INT 1 GTT INTO OU QHS     ??? sertraline (ZOLOFT) 50 MG tablet take 1 tablet by mouth once daily     ??? SITagliptin (JANUVIA) 50 MG tablet Take 1 tablet (50 mg total) by mouth daily. 90 tablet 3   ??? spironolactone (ALDACTONE) 25 MG tablet Take 1 tablet (25 mg total) by mouth daily. 90 tablet 3   ??? tamsulosin (FLOMAX) 0.4 mg capsule TAKE 1 CAPSULE(0.4 MG) BY MOUTH DAILY 90 capsule 3   ??? torsemide (DEMADEX) 20 MG tablet Take 2 tablets (40 mg total) by mouth daily. 90 tablet 3     No current facility-administered medications for this visit.        Changes to medications: [name] reports no changes at this time.    Allergies   Allergen Reactions   ??? Colchicine Analogues Diarrhea     Have diarrhea when taken for long periods of time   ??? Tramadol Other (See Comments)     unknown tolerates morphine       Changes to allergies: No    SPECIALTY MEDICATION ADHERENCE     Tivicay 50 mg: 4-7 days of medicine on hand   Descovy   : 4-7 days of medicine on hand       Medication Adherence    Patient reported X missed doses in the last month: 0  Specialty Medication: Tivicay 50mg   Patient is on additional specialty medications: Yes  Additional Specialty Medications: Descovy  Patient Reported Additional Medication X Missed Doses in the Last Month: 0  Patient is on more than two specialty medications: No  Any gaps in refill history greater than 2 weeks in the last 3 months: no  Demonstrates understanding of importance of adherence: yes  Informant: patient  Provider-estimated medication adherence level: good  Patient is at risk for Non-Adherence: No          Specialty medication(s) dose(s) confirmed: Regimen is correct and unchanged.     Are there any concerns with adherence? No    Adherence counseling provided? Not needed    CLINICAL MANAGEMENT AND INTERVENTION      Clinical Benefit Assessment:    Do you feel the medicine is effective or helping your condition? Yes    HIV ASSOCIATED LABS:     Lab Results   Component Value Date/Time    HIVRS Not Detected 10/10/2019 04:38 PM    HIVRS Not Detected 06/08/2018 10:18 AM    HIVRS Detected (A) 12/01/2017 10:16 AM    HIVRS Not Detected 08/15/2014  11:42 AM    HIVRS Not Detected 01/03/2014 10:46 AM    HIVRS Not Detected 11/23/2012 12:53 PM    HIVCP <40 05/16/2019 02:24 PM    HIVCP <40 09/12/2018 10:06 AM    HIVCP <40 (H) 12/01/2017 10:16 AM    HIVCP <40 (H) 09/01/2017 11:34 AM    HIVCP <40 (H) 04/02/2017 12:44 PM    ACD4 693 10/10/2019 04:38 PM    ACD4 417 (L) 09/01/2017 11:34 AM    ACD4 290 (L) 04/02/2017 12:44 PM    ACD4 514 08/01/2014 09:35 AM    ACD4 430 (L) 01/03/2014 10:46 AM    ACD4 549 11/23/2012 12:53 PM       Clinical Benefit counseling provided? Labs from 10/10/2019 show evidence of clinical benefit    Adverse Effects Assessment:    Are you experiencing any side effects? No    Are you experiencing difficulty administering your medicine? No    Quality of Life Assessment:    How many days over the past month did your HIV  keep you from your normal activities? For example, brushing your teeth or getting up in the morning. 0    Have you discussed this with your provider? Not needed    Therapy Appropriateness:    Is therapy appropriate? Yes, therapy is appropriate and should be continued    DISEASE/MEDICATION-SPECIFIC INFORMATION      N/A    PATIENT SPECIFIC NEEDS     - Does the patient have any physical, cognitive, or cultural barriers? No    - Is the patient high risk? No     - Does the patient require a Care Management Plan? No     - Does the patient require physician intervention or other additional services (i.e. nutrition, smoking cessation, social work)? No      SHIPPING     Specialty Medication(s) to be Shipped:   Infectious Disease: Descovy and Tivicay    Other medication(s) to be shipped: n/a     Changes to insurance: No    Delivery Scheduled: Yes, Expected medication delivery date: 11/17/19.     Medication will be delivered via Same Day Courier to the confirmed prescription address in [facility].    The patient will receive a drug information handout for each medication shipped and additional FDA Medication Guides as required.  Verified that patient has previously received a Conservation officer, historic buildings.    All of the patient's questions and concerns have been addressed.    Roderic Palau   [facility] Specialty Pharmacist

## 2019-11-17 MED FILL — DESCOVY 200 MG-25 MG TABLET: 30 days supply | Qty: 30 | Fill #0 | Status: AC

## 2019-11-17 MED FILL — DESCOVY 200 MG-25 MG TABLET: ORAL | 30 days supply | Qty: 30 | Fill #0

## 2019-11-17 MED FILL — TIVICAY 50 MG TABLET: 30 days supply | Qty: 30 | Fill #0 | Status: AC

## 2019-11-20 DIAGNOSIS — N184 Chronic kidney disease, stage 4 (severe): Principal | ICD-10-CM

## 2019-11-22 ENCOUNTER — Ambulatory Visit: Admit: 2019-11-22 | Discharge: 2019-11-23 | Payer: MEDICARE

## 2019-11-22 ENCOUNTER — Ambulatory Visit: Admit: 2019-11-22 | Discharge: 2019-11-23 | Payer: MEDICARE | Attending: Family | Primary: Family

## 2019-11-22 DIAGNOSIS — K7469 Other cirrhosis of liver: Principal | ICD-10-CM

## 2019-11-22 DIAGNOSIS — Z1321 Encounter for screening for nutritional disorder: Principal | ICD-10-CM

## 2019-11-22 LAB — HEPATIC FUNCTION PANEL
ALBUMIN: 4 g/dL (ref 3.4–5.0)
ALT (SGPT): 47 U/L (ref 10–49)
BILIRUBIN TOTAL: 0.8 mg/dL (ref 0.3–1.2)
PROTEIN TOTAL: 7.9 g/dL (ref 5.7–8.2)

## 2019-11-22 LAB — CBC W/ AUTO DIFF
BASOPHILS ABSOLUTE COUNT: 0 10*9/L (ref 0.0–0.1)
BASOPHILS RELATIVE PERCENT: 0.4 %
EOSINOPHILS ABSOLUTE COUNT: 0.1 10*9/L (ref 0.0–0.7)
EOSINOPHILS RELATIVE PERCENT: 1.8 %
HEMATOCRIT: 39.6 % (ref 38.0–50.0)
HEMOGLOBIN: 13.4 g/dL — ABNORMAL LOW (ref 13.5–17.5)
LYMPHOCYTES ABSOLUTE COUNT: 2 10*9/L (ref 0.7–4.0)
LYMPHOCYTES RELATIVE PERCENT: 24.8 %
MEAN CORPUSCULAR HEMOGLOBIN CONC: 33.8 g/dL (ref 30.0–36.0)
MEAN CORPUSCULAR HEMOGLOBIN: 31.8 pg (ref 26.0–34.0)
MEAN CORPUSCULAR VOLUME: 94.2 fL (ref 81.0–95.0)
MEAN PLATELET VOLUME: 8.9 fL (ref 7.0–10.0)
MONOCYTES ABSOLUTE COUNT: 0.6 10*9/L (ref 0.1–1.0)
NEUTROPHILS ABSOLUTE COUNT: 5.3 10*9/L (ref 1.7–7.7)
NUCLEATED RED BLOOD CELLS: 0 /100{WBCs} (ref <=4)
PLATELET COUNT: 157 10*9/L (ref 150–450)
RED BLOOD CELL COUNT: 4.2 10*12/L — ABNORMAL LOW (ref 4.32–5.72)
RED CELL DISTRIBUTION WIDTH: 14.1 % (ref 12.0–15.0)

## 2019-11-22 LAB — BASIC METABOLIC PANEL
ANION GAP: 5 mmol/L (ref 3–11)
BLOOD UREA NITROGEN: 28 mg/dL — ABNORMAL HIGH (ref 9–23)
CALCIUM: 10.9 mg/dL — ABNORMAL HIGH (ref 8.7–10.4)
CHLORIDE: 103 mmol/L (ref 98–107)
CO2: 27.7 mmol/L (ref 20.0–31.0)
EGFR CKD-EPI AA MALE: 31 mL/min/{1.73_m2}
EGFR CKD-EPI NON-AA MALE: 27 mL/min/{1.73_m2}
GLUCOSE RANDOM: 155 mg/dL (ref 70–179)
POTASSIUM: 4.8 mmol/L (ref 3.5–5.1)
SODIUM: 136 mmol/L (ref 135–145)

## 2019-11-22 LAB — BASOPHILS RELATIVE PERCENT: Basophils/100 leukocytes:NFr:Pt:Bld:Qn:Automated count: 0.4

## 2019-11-22 LAB — CREATININE: Creatinine:MCnc:Pt:Ser/Plas:Qn:: 2.38 — ABNORMAL HIGH

## 2019-11-22 LAB — PROTIME-INR: INR: 1.12

## 2019-11-22 LAB — BILIRUBIN DIRECT: Bilirubin.glucuronidated+Bilirubin.albumin bound:MCnc:Pt:Ser/Plas:Qn:: 0.3

## 2019-11-22 LAB — AFP-TUMOR MARKER: Alpha-1-Fetoprotein.tumor marker:MCnc:Pt:Ser/Plas:Qn:: 2

## 2019-11-22 LAB — INR: Coagulation tissue factor induced.INR:RelTime:Pt:PPP:Qn:Coag: 1.12

## 2019-11-22 NOTE — Unmapped (Signed)
1.  Laboratory studies done today as part of continued cirrhosis care.   2.  Office follow up six months.   3.  Abdominal ultrasound wiil be scheduled for six month for continued liver cancer screening.   4.  Healthy diet, regular exercise program and weight management. Goal of 10 pound weight loss by next visit.   5.  Strict control of diabetes mellitus is very important.   6.  Make follow up appointment with cardiology as we discussed.   7.  Any questions please let me know.

## 2019-11-23 NOTE — Unmapped (Signed)
[facility] Ph [phone] Fax [phone]      Woodfin Ganja, M.D.  Professor of Medicine  Director, [facility]  [place] at [facility]    Referring Provider:  None Per Patient Pcp  No address on file     Primary Care Provider:  Barbette Reichmann, MD    CHIEF COMPLAINT: Well compensated HCV cirrhosis     HISTORY OF PRESENT ILLNESS:  This is a 70 y.o. AA male with history of HCV and HIV coinfection whose chief complaint today is cirrhosis care. He is successfully treated and cured of HCV s/p Zepatier x 12 weeks - NS5A RAV 07/24/15 no resistance predicted. The start date of his DAA treatment was 04/20/2016 with completion date of July 13, 2016. His SVR HCV RNA level was not detected indicating evidence of having achieved SVR - CURED. He adhered to taking Zepatier one tablet daily ~ 8 pm nightly with the exception of having missed one dose of treatment.     He was treatment-naive with genotype 1a of which he had been diagnosed ~1990s. The mode of transmission was likely secondary to IVDU. He was diagnosed with HIV in 2001. For years, patient was hesitant to initiate therapy for both HIV and HCV. Beginning ~2010, patient started on Atripla. He was transitioned to Descovy/dolutegravir due to worsening CKD and remains stable on this ART regimen. Most recent HIV RNA level is not detected on 10/10/2019. Liver biopsy 05/2005 then showed grade 2 stage 2 cirrhosis.     He presents with his wife today. He states he has been doing well, but they do mention his nephrologist feels he needs to start the process for renal transplant. eGFR declined to 17, creatinine rose to 4.01. Baseline ~ 2.3 range. Denies history of hepatic decompensation specifically variceal bleed, ascites, SBP or encephalopathy. His risk factors for more aggressive fibrosis with HCV include HIV and truncal obesity. Patient's medical history is significant for cardiovascular disease. He was admitted 10/2015 for NSTEMI with mult-vessel disease on LHC. He has been under the care of Dr. Marcelle Overlie, cardiology department at [facility]. PMH is also significant for mild IDA secondary to CKD stage III. Under care of hematologist with [facility].     Denies any jaundice, pruritis, CP, increased abdominal girth, LE edema, melena, BRBPR, confusion, depression, nausea, vomiting, or constipation.Complaint of shortness of breath with exertion. He is under the care of dietician. There was some concern for possible early dementia. Started on Aricept. There was no improvement with mental status and medication stopped.      Cirrhosis Care:  -- Varices screen: 09/2015 EGD no varices (Normal platelet count and Fibroscan <20)  -- Mild Schatzki ring, non bleeding gastric ulcers, gastritis   -- HCC screen: Abdominal ultrasound 11/22/2019:The liver is heterogeneously echogenic with nodular contour. No focal hepatic lesions. No intrahepatic biliary ductal dilatation. The common bile duct is normal in caliber. Borderline enlarged spleen with normal echotexture. Scattered echogenic foci in the spleen appear unchanged from prior. Normal in size and echotexture with mildly lobular contour. Right exophytic lower pole cyst measures 5.7 x 5.5 x 4.6 cm, previously 6.6 cm. Multiple left renal cysts measuring up to 3.7 x 3.6 x 3.2 cm in the upper pole. No solid masses. Nonobstructing nephrolithiasis bilaterally measuring up to 1.3 cm in the left upper pole. No hydronephrosis. No ascites.     -- Vaccination: Immune to HAV; Hepatitis B vaccination series completed.   -- Bone Health: Bone density  study ordered.   -- HCV RNA 328,448 iu/ml genotype 1a 01/2016  HCV RNA TW #5 - 05/21/2016 - not detected   HCV RNA 06/10/2016 - not detected.   HCV RNA Post TW #6 - 08/24/2016 - not detected  HCV RNA 10/28/2016 - not detected.     -- NS5A RAV testing 07/24/15 no resistance predicted  -- Fibroscan 04/2016 13 kPa/F4  -- Liver, percutaneous biopsy 05/08/2005:  Chronic hepatitis, hepatitis C by history, grade 2 stage 2. Minimal macrovesicular steatosis affecting <5% of liver parenchyma     REVIEW OF SYSTEMS:   All systems reviewed and negative other than what is stated above in HPI.     PAST MEDICAL HISTORY:    Past Medical History:   Diagnosis Date   ??? CKD (chronic kidney disease) stage 3, GFR 30-59 ml/min 01/08/2015   ??? Coronary artery disease 10/2015    Multivessel disease, plan medical management after cath 6/17   ??? GERD (gastroesophageal reflux disease)    ??? Gout    ??? HIV (human immunodeficiency virus infection) (CMS-HCC)     Undectable viral load 5/17   ??? Hypertension    ??? Nephrolithiasis    ??? Nephrotic syndrome 03/05/2014   ??? Renal mass     Followed by neprhology, MRI 8/16 improved, felt to be benign     PAST SURGICAL HISTORY:    Past Surgical History:   Procedure Laterality Date   ??? BACK SURGERY      2012   ??? PR CATH PLACE/CORON ANGIO, IMG SUPER/INTERP,W LEFT HEART VENTRICULOGRAPHY N/A 11/13/2015    Procedure: Left Heart Catheterization;  Surgeon: Orpha Bur, MD;  Location: [facility] CATH;  Service: Cardiology   ??? PR REMOVAL OF ANAL FISSURE N/A 06/29/2014    Procedure: FISSURECTOMY, INCLUDING SPHINCTEROTOMY, WHEN PERFORMED;  Surgeon: Romero Belling, MD;  [facility]: [facility];  Service: Gastrointestinal   ??? PR SURG DIAGNOSTIC EXAM, ANORECTAL N/A 06/29/2014    Procedure: ANORECTAL EXAM, SURGICAL, REQUIRING ANESTHESIA (GENERAL, SPINAL, OR EPIDURAL), DIAGNOSTIC;  Surgeon: Romero Belling, MD;  Location: ASC OR [facility];  Service: Gastrointestinal     MEDICATIONS:  Current Outpatient Medications   Medication Sig Dispense Refill   ??? albuterol (PROVENTIL HFA;VENTOLIN HFA) 90 mcg/actuation inhaler Inhale 2 puffs.     ??? allopurinoL (ZYLOPRIM) 100 MG tablet Take 250 mg by mouth.     ??? amLODIPine (NORVASC) 2.5 MG tablet Take 3 tablets (7.5 mg total) by mouth every morning. 90 tablet 3   ??? aspirin (ECOTRIN) 81 MG tablet   0   ??? atenoloL (TENORMIN) 100 MG tablet Take 1 tablet (100 mg total) by mouth daily. 90 tablet 3   ??? BACLOFEN, BULK, TOP Apply topically. Baclofen 2%, Diclofenac 5%, Gabapentin 6%, Tetracaine 3%     ??? blood sugar diagnostic (ONETOUCH VERIO TEST STRIPS) Strp Use 2 (two) times daily E11.22     ??? calcium carbonate-vitamin D2 500 mg(1,250mg ) -200 unit tablet Take 1 tablet by mouth Two (2) times a day.     ??? cloNIDine HCL (CATAPRES) 0.1 MG tablet Take 1 tablet (0.1 mg total) by mouth 3 (three) times a day. 270 tablet 3   ??? cranberry 400 mg cap Take 400 mg by mouth daily.     ??? cyanocobalamin 1000 MCG tablet Take 1,000 mcg by mouth.     ??? cysteamine bitartrate (PROCYSBI) 300 mg grdp 1 each.     ??? diazepam (VALIUM) 5 MG tablet Take 5 mg by mouth nightly.      ???  dicyclomine (BENTYL) 10 mg capsule Take 10 mg by mouth two (2) times a day as needed.      ??? docusate sodium (COLACE) 100 MG capsule Take 1 capsule (100 mg total) by mouth every twelve (12) hours. 60 capsule 0   ??? dolutegravir (TIVICAY) 50 mg TABLET Take 1 tablet by mouth daily 30 tablet 11   ??? dorzolamide-timoloL (COSOPT) 22.3-6.8 mg/mL ophthalmic solution INSTRILL 1 DROP INTO BOTH EYES EACH MORNING     ??? doxazosin (CARDURA) 4 MG tablet Take 1 tablet (4 mg total) by mouth nightly. 90 tablet 3   ??? emtricitabine-tenofovir alafen (DESCOVY) 200-25 mg tablet Take 1 tablet by mouth daily 30 tablet 11   ??? ferrous sulfate 325 (65 FE) MG tablet Take 325 mg by mouth daily with breakfast.      ??? folic acid (FOLVITE) 1 MG tablet   0   ??? gabapentin (NEURONTIN) 300 MG capsule TAKE 1 CAPSULE BY MOUTH EVERY NIGHT AT BEDTIME FOR 4 DAYS THEN 1 CAPSULE TWICE DAILY     ??? glimepiride (AMARYL) 4 MG tablet Take 1 tablet (4 mg total) by mouth daily. 90 tablet 3   ??? hydrALAZINE (APRESOLINE) 100 MG tablet Take 1 tablet (100 mg total) by mouth Two (2) times a day. 180 tablet 3   ??? HYDROcodone-acetaminophen (NORCO) 5-325 mg per tablet Directions are take one tablet 30 minutes before physical therapy, then take one tablet at bedtime as needed for pain     ??? lisinopriL (PRINIVIL,ZESTRIL) 40 MG tablet Take 1 tablet (40 mg total) by mouth daily. 90 tablet 3   ??? meTOLAzone (ZAROXOLYN) 10 MG tablet Take 1 tablet (10 mg total) by mouth daily. 90 tablet 3   ??? nitroglycerin (NITROSTAT) 0.4 MG SL tablet DISSOLVE ONE TABLET UNDER TONGUE AS NEEDED FOR CHEST PAIN EVERY 5 MINUTES AS DIRECTED 25 tablet 0   ??? nystatin (MYCOSTATIN) 100,000 unit/mL suspension Take 5 mL by mouth daily.      ??? omega-3 fatty acids-fish oil 340-1,000 mg capsule Take 1 capsule by mouth.     ??? ondansetron (ZOFRAN) 4 MG tablet take 1 tablet by mouth every 8 hours if needed for nausea     ??? ROCKLATAN 0.02-0.005 % Drop INT 1 GTT INTO OU QHS     ??? sertraline (ZOLOFT) 50 MG tablet take 1 tablet by mouth once daily     ??? SITagliptin (JANUVIA) 50 MG tablet Take 1 tablet (50 mg total) by mouth daily. 90 tablet 3   ??? tamsulosin (FLOMAX) 0.4 mg capsule TAKE 1 CAPSULE(0.4 MG) BY MOUTH DAILY 90 capsule 3   ??? torsemide (DEMADEX) 20 MG tablet Take 2 tablets (40 mg total) by mouth daily. 90 tablet 3   ??? atorvastatin (LIPITOR) 20 MG tablet TAKE 1 TABLET (20 MG) BY MOUTH DAILY 90 tablet 3   ??? oxyCODONE-acetaminophen (PERCOCET) 5-325 mg per tablet Take 1 tablet by mouth. (Patient not taking: Reported on 11/22/2019)     ??? spironolactone (ALDACTONE) 25 MG tablet Take 1 tablet (25 mg total) by mouth daily. 90 tablet 3     No current facility-administered medications for this visit.     ALLERGIES:    Colchicine analogues and Tramadol    SOCIAL HISTORY:    Social History     Socioeconomic History   ??? Marital status: Married     Spouse name: None   ??? Number of children: None   ??? Years of education: None   ??? Highest education level: None  Occupational History   ??? None   Tobacco Use   ??? Smoking status: Former Smoker     Packs/day: 1.00     Years: 10.00     Pack years: 10.00     Quit date: 03/11/1992     Years since quitting: 27.7   ??? Smokeless tobacco: Never Used   Substance and Sexual Activity   ??? Alcohol use: No     Alcohol/week: 0.0 standard drinks   ??? Drug use: No Comment: Prior heroin use   ??? Sexual activity: None   Other Topics Concern   ??? None   Social History Narrative   ??? None     Social Determinants of Health     Financial Resource Strain:    ??? Difficulty of Paying Living Expenses:    Food Insecurity:    ??? Worried About Programme researcher, broadcasting/film/video in the Last Year:    ??? Barista in the Last Year:    Transportation Needs:    ??? Freight forwarder (Medical):    ??? Lack of Transportation (Non-Medical):    Physical Activity:    ??? Days of Exercise per Week:    ??? Minutes of Exercise per Session:    Stress:    ??? Feeling of Stress :    Social Connections:    ??? Frequency of Communication with Friends and Family:    ??? Frequency of Social Gatherings with Friends and Family:    ??? Attends Religious Services:    ??? Database administrator or Organizations:    ??? Attends Engineer, structural:    ??? Marital Status:      FAMILY HISTORY:    family history includes Cancer in his father and mother; Diabetes in his brother; Heart disease (age of onset: 67) in his sister; Hypertension in his mother and sister; Kidney disease in his father; Thyroid disease in his mother.      VITAL SIGNS:  BP 135/79  - Pulse 56  - Temp 35.9 ??C (96.7 ??F)  - Wt 99.8 kg (220 lb)  - SpO2 99%  - BMI 30.68 kg/m??   General appearance - alert, well appearing, and in no distress, oriented to person, place, and time and overweight  Mental status - normal mood, behavior, speech, dress, motor activity, and thought processes  Eyes - pupils equal and reactive, extraocular eye movements intact, sclera anicteric  Chest - clear to auscultation, no wheezes, rales or rhonchi, symmetric air entry  Heart - normal rate, regular rhythm, normal S1, S2, 2/6 systolic murmur. No rubs, clicks or gallops  Abdomen - soft, nontender, nondistended, no masses or organomegaly  Neurological - screening mental status exam normal  Musculoskeletal - no joint tenderness, deformity or swelling  Extremities - peripheral pulses normal, no pedal edema, no clubbing or cyanosis  Skin - normal coloration and turgor, no rashes, no suspicious skin lesions noted    Laboratory results:  Results for orders placed or performed in visit on 11/22/19   AFP non-maternal tumor marker   Result Value Ref Range    AFP-Tumor Marker 2 <8 ng/mL   PT-INR   Result Value Ref Range    PT 13.3 10.5 - 13.5 sec    INR 1.12    Hepatic Function Panel   Result Value Ref Range    Albumin 4.0 3.4 - 5.0 g/dL    Total Protein 7.9 5.7 - 8.2 g/dL    Total Bilirubin 0.8 0.3 - 1.2  mg/dL    Bilirubin, Direct 4.01 0.00 - 0.30 mg/dL    AST 34 (H) <02 U/L    ALT 47 10 - 49 U/L    Alkaline Phosphatase 64 46 - 116 U/L   Basic metabolic panel   Result Value Ref Range    Sodium 136 135 - 145 mmol/L    Potassium 4.8 3.5 - 5.1 mmol/L    Chloride 103 98 - 107 mmol/L    CO2 27.7 20.0 - 31.0 mmol/L    Anion Gap 5 3 - 11 mmol/L    BUN 28 (H) 9 - 23 mg/dL    Creatinine 7.25 (H) 0.60 - 1.10 mg/dL    BUN/Creatinine Ratio 12     EGFR CKD-EPI Non-African American, Male 27 mL/min/1.13m2    EGFR CKD-EPI African American, Male 31 mL/min/1.65m2    Glucose 155 70 - 179 mg/dL    Calcium 36.6 (H) 8.7 - 10.4 mg/dL   CBC w/ Differential   Result Value Ref Range    WBC 8.1 3.5 - 10.5 10*9/L    RBC 4.20 (L) 4.32 - 5.72 10*12/L    HGB 13.4 (L) 13.5 - 17.5 g/dL    HCT 44.0 34.7 - 42.5 %    MCV 94.2 81.0 - 95.0 fL    MCH 31.8 26.0 - 34.0 pg    MCHC 33.8 30.0 - 36.0 g/dL    RDW 95.6 38.7 - 56.4 %    MPV 8.9 7.0 - 10.0 fL    Platelet 157 150 - 450 10*9/L    nRBC 0 <=4 /100 WBCs    Neutrophils % 65.6 %    Lymphocytes % 24.8 %    Monocytes % 7.4 %    Eosinophils % 1.8 %    Basophils % 0.4 %    Absolute Neutrophils 5.3 1.7 - 7.7 10*9/L    Absolute Lymphocytes 2.0 0.7 - 4.0 10*9/L    Absolute Monocytes 0.6 0.1 - 1.0 10*9/L    Absolute Eosinophils 0.1 0.0 - 0.7 10*9/L    Absolute Basophils 0.0 0.0 - 0.1 10*9/L      ASSESSMENT/PLAN:  1. Well compensated cirrhosis based on fibrosis score~ secondary to HCV:  Fibroscan - score: 13 kPa/early F4.      [name] is a 69 y.o. male with past medical history of HIV, CKD, CAD c/b NSTEMI 10/2015 and well compensated cirrhosis based on fibrosis score. Cirrhosis secondary to chronic HCV. He has continued to do well from hepatic standpoint. Despite his diagnosis cirrhosis, patient has remained clinically compensated with intact synthetic function. He is at risk for progression of his cirrhosis with risk factors for ongoing liver disease including HCV, HIV, and obesity. HCV has been successfully treated and cured s/p Zepatier x 12 weeks.     ~ MELD labs and AFP tumor marker ordered.   ~ Office follow up six months  ~ Instructed healthy diet, regular exercise program and weight management. Remain under the care of dietician. Goal of 10 pound weight loss by next visit. Strict control of diabetes mellitus is very important.     2.  HCV treatment~ Successfully treated s/p twelve weeks of Zepatier.     3.  History of CAD ~ NSTEMI 2017. Under the care of [facility] cardiology. Symptomatic with experiencing SOB on exertion. Instructed to follow up with cardiology. Last visit ~ 2 years ago.     4.  HCC screening: Up to date. Abdominal ultrasound ordered for same date of next office visit.  5.  Alcohol assessment: No alcohol use.   AUDIT score = zero.     6.  Osteoporosis screening: Warrants DEXA scan.     7. Variceal screening: Address next visit.     8. GERD, well controlled.      9. CKD: Under the care of nephrology.       All patient's and his wife's questions were answered to their satisfaction during visit today.    Rodman Key, DNP, FNP-BC  [facility]  [address]  [place] [address]  Phone [phone]

## 2019-11-24 LAB — VITAMIN D, TOTAL (25OH): Lab: 50.7

## 2019-11-25 LAB — ZINC: Zinc:MCnc:Pt:Ser/Plas:Qn:: 1.12 — ABNORMAL HIGH

## 2019-11-27 LAB — VITAMIN A RESULT: Retinol:MCnc:Pt:Ser/Plas:Qn:: 115 — ABNORMAL HIGH

## 2019-11-29 NOTE — Unmapped (Signed)
Received a referral for transplant for this patient.  ABO: B+    Coordinator: Megan  Patient is being Re-Referred: no    69 y.o. year old patient who was referred to [facility] for Transplant  ESRD due to Unknown at this time  Dialysis: None, GFR 17  Comorbidities: HTN, HIV, h/o UTIs and kidney stones; IBS, liver cirrhosis, Hep C (cured); HFpEF, CAD, Gout, GERD,   BMI: 30.68    Patient Speaks English  Outreach: None    Candidate: Track 1  Scheduling Instructions:  Testing:   ?? Labs  ?? EKG  ?? Chest X-Ray  ?? Stress Test  ?? CT: abdomen/pelvis without Contrast     Consults:  ?? Orientation/Education Class  ?? Social Worker  ?? Financial Coordinator  ?? Surgeon  ?? Nephrology  ?? Infectious Disease  - HIV    Include Blurb in Letter:  ?? Colonoscopy      Already completed at [facility]:   RUS - 11/22/19  ECHO - 10/20/19

## 2019-12-02 DIAGNOSIS — I2510 Atherosclerotic heart disease of native coronary artery without angina pectoris: Principal | ICD-10-CM

## 2019-12-02 MED ORDER — CARVEDILOL 12.5 MG TABLET
ORAL_TABLET | 3 refills | 0.00000 days
Start: 2019-12-02 — End: ?

## 2019-12-04 DIAGNOSIS — N184 Chronic kidney disease, stage 4 (severe): Principal | ICD-10-CM

## 2019-12-07 MED ORDER — CARVEDILOL 12.5 MG TABLET
0.00000 days
Start: 2019-12-07 — End: ?

## 2019-12-07 NOTE — Unmapped (Signed)
Received a Rx refill for Carvedilol 12.5mg   Tablets. Messaged MD to clarify if pt was still taking this medication. Per MD pt is no longer taking this medication.

## 2019-12-08 NOTE — Unmapped (Signed)
Pt called RX line for an appt.  Last saw Dr. Willa Rough in 07/2018.

## 2019-12-13 NOTE — Unmapped (Signed)
[facility] Refill Coordination Note    Specialty Medication(s) to be Shipped:   Infectious Disease: Descovy and Tivicay    Other medication(s) to be shipped: n/a     Samuel [name], DOB: 1950/10/22  Phone: [phone] (home)       All above HIPAA information was verified with patient.     Was a Nurse, learning disability used for this call? No    Completed refill call assessment today to schedule patient's medication shipment from the [facility] [phone]).       Specialty medication(s) and dose(s) confirmed: Regimen is correct and unchanged.   Changes to medications: Franck reports no changes at this time.  Changes to insurance: No  Questions for the pharmacist: No    Confirmed patient received Welcome Packet with first shipment. The patient will receive a drug information handout for each medication shipped and additional FDA Medication Guides as required.       DISEASE/MEDICATION-SPECIFIC INFORMATION        N/A    SPECIALTY MEDICATION ADHERENCE     Medication Adherence    Patient reported X missed doses in the last month: 0  Specialty Medication: DECOVY 200-25MG   Patient is on additional specialty medications: Yes  Additional Specialty Medications: TIVICAY 50MG   Patient Reported Additional Medication X Missed Doses in the Last Month: 0  Patient is on more than two specialty medications: No                tivicay  : 7 days of medicine on hand   descovy  : 7 days of medicine on hand         SHIPPING     Shipping address confirmed in Epic.     Delivery Scheduled: Yes, Expected medication delivery date: 7/20.     Medication will be delivered via Next Day Courier to the prescription address in Epic WAM.    [name]   [facility] Specialty Technician

## 2019-12-18 DIAGNOSIS — N184 Chronic kidney disease, stage 4 (severe): Principal | ICD-10-CM

## 2019-12-18 MED FILL — DESCOVY 200 MG-25 MG TABLET: 30 days supply | Qty: 30 | Fill #1 | Status: AC

## 2019-12-18 MED FILL — TIVICAY 50 MG TABLET: 30 days supply | Qty: 30 | Fill #1 | Status: AC

## 2019-12-18 MED FILL — TIVICAY 50 MG TABLET: ORAL | 30 days supply | Qty: 30 | Fill #1

## 2019-12-18 MED FILL — DESCOVY 200 MG-25 MG TABLET: ORAL | 30 days supply | Qty: 30 | Fill #1

## 2019-12-21 NOTE — Unmapped (Signed)
Received referral to verify pt's insurance.  Pt has active coverage with Medicare (A/B/D) and BCBS [place] [facility]. Prescription plan is through [employer].   Prior auth for kidney transplant eval is not required.    Pt is financially clear to proceed with kidney transplant eval process.

## 2019-12-22 ENCOUNTER — Ambulatory Visit: Admit: 2019-12-22 | Discharge: 2019-12-23 | Payer: MEDICARE

## 2019-12-22 DIAGNOSIS — G4733 Obstructive sleep apnea (adult) (pediatric): Principal | ICD-10-CM

## 2019-12-22 DIAGNOSIS — I2510 Atherosclerotic heart disease of native coronary artery without angina pectoris: Principal | ICD-10-CM

## 2019-12-22 NOTE — Unmapped (Signed)
DIVISION OF CARDIOLOGY  [facility] [place]        Date of Service: 12/22/2019    Follow-up Patient Clinic Note    PCP: Referring Provider:   Barbette Reichmann, MD  [address] [place] [address]  Phone: [phone]  Fax: [phone] Barbette Reichmann, MD  [address]  [place],  [place] [address]  Phone: [phone]  Fax: [phone]       Assessment and Plan:     Concern for obstructive sleep apnea:   Patient reports of 1 month of possibly worsening fatigue, decreasing energy level, daytime tiredness and requiring daily mid afternoon naps, and abrupt and random episodes of dyspnea at rest.  His wife confirms very loud snoring at night with occasional episodes of apnea; she also confirms that he take naps daily.  Patient reports a sleep study done in 2019 however does not know results and we are unable to access in epic.  Exam today not concerning for hypervolemia. Current symptoms are concerning for OSA and discussed importance of prompt diagnosis and treatment as needed; patient amenable.   - Referral for home sleep study    Coronary artery disease, angina present with recent NSTEMI (11/12/15):   Coronary angiography 11/13/15 showed diffuse disease including 90% large OM without any intervention. No significant ischemic signs or symptoms since that time. Does report sharp reproducible chest pain occasionally since the last visit; notes that he has used nitroglycerin about 3-4 times in the past year.  EKG today with sinus bradycardia with first-degree AV block, and nonspecific T wave changessimilar to prior.  Reports of increased fatigue and dyspnea likely due to above.  - Continue carvedilol (COREG) 12.5mg  BID, Aspirin 81mg , and Atorvastatin 20mg     H/o hypervolemia in setting of CKD, liver dysfunction,  and HFpEF:   Patient appears euvolemic on exam today; Lungs are clear today with trace BLE edema. Of note, his torsemide dose was increased to 40 mg daily as well as addition of metolazone 10 by nephrologist: This caused a rise in his creatinine to 4.0.  He is edema has since significantly improved though has some mild dose was decreased down to 20 mg daily.  Thus we will need to be careful with more aggressive diuresis in setting of renal dysfunction.  Per Nephrology clinic note on 10/31/19, the eGFR is now in a range where we need to consider his candidacy for transplantation.  - Continue torsemide 20mg  daily, Metolazone 10mg  daily, and Spironolactone 25mg  daily    Resistant HTN: BP 158/97 today; will benefit from improved control especially in the setting of known CAD.  Currently managed by Nephrologist.  - Continue amlodipine 7.5 mg once daily, atenolol 100 mg once daily, doxazosin 4 mg daily, lisinopril 40 mg once daily, spironolactone 25 mg once daily, and hydralazine 100 mg twice daily    CKD, Stage G4:A2: Currently follows with nephrology.  Per recent clinic note his GFR is in the range to consider candidacy for transplantation.  Creatinine was 4.01 on June 1st in setting of increased torsemide and addition of metolazone for hypervolemia; now back to baseline at 2.38 after decreasing torsemide dose.  - Continue follow-up by nephrology and possible evaluation for kidney transplant      Human immunodeficiency virus (HIV) disease:  - Continue to follow with ID    Aortic Sclerosis:  - Systolic murmur c/w prior.    Samuel [name] will return in 6 month  or sooner as needed.     The patient was seen with and evaluated by Dr. Hyacinth Meeker.    Subjective:     Chief Complaint:  Fatigue, lack of energy, dyspnea    History of Present Illness:   Samuel [name] is a 69 y.o. male with a history of CAD with recent NSTEMI 10/2015, HIV (diagnosed 12/1999), CKD, Chronic HCV, gout, nephrotic syndrome, and HTN who is presents for evaluation of worsening fatigue, decreased energy and dyspnea.      Of note, patient was admitted on 11/12/2015 to the [facility] with typical substernal chest pain described as pressure brought on by exertion, with shortness breath and diaphoresis with radiation to left arm. Labs were notable for elevated troponin, peak at 0.585 downtrending 0.433, and EKG showing T-wave inversions in the lateral leads. Echocardiography showed normal LVEF, diastolic dysfunction and moderate to severe LVH. Patient received LHC on 6/14 that showed coronary artery disease including: 40% mid LAD at the D1 bifurcation area, 30-40% mid/distal LAD, 95% stenosis in the large branching OM, 80-90% stenoses in both distal branches of the large OM, diffuse mild disease in the RCA. Decision was made to medically manage patient is discharged on DAPT with aspirin and clopidogrel in addition to Imdur 30 daily for chest pain. Patient was last seen almost 2 years ago and has canceled / rescheduled multiple follow up appointments since that time.      Interval history:  Patient reports fatigue and feeling  winded with minimal activity, decreased energy level, abrupt/random onset of dyspnea at rest for about 1 month.  He notes that he usually starts to feel very tired after doing his daily work in the morning and usually requires a midday nap daily to recover.  His wife states that his naps takes several hours and most often she wakes him up from sleep.  She reports that patient snores very loudly and she has witnessed several episodes of apnea while he sleeps.  He also reports ongoing occasional sharp left chest pain; he notes that he has used nitroglycerin about 3-4 times this past year.  He also reports that his lower extremity edema has significantly improved since interval increase of his torsemide in addition of metolazone by nephrology; he notes that torsemide dose has since been decreased to 20 mg daily in setting of worsening creatinine.  He continues to wear his compression stockings. Otherwise he denied orthopnea or PND dyspnea on exertion, presyncope or syncope.    He continues to follow with ID for his HIV. Home regimen of dolutegravir 50mg  and emtricitabine-tenofovir 200-25mg . He was also recently seen by nephrology who who states that his GFR is now in range for transplant candidacy.    Cardiovascular History & Procedures:  Cardiovascular Problems:  ?? CAD with NSTEMI 11/12/2015  ?? HTN    Risk Factors:  advanced age (older than 77 for men, 65 for women), hypertension and male gender    Cath / PCI:  ?? Coronary Angiography 11/13/2015:   1. Coronary artery disease including:  a) 40% mid LAD at the D1 bifurcation area  b) 30-40% mid/distal LAD  c) 95% stenosis in the large branching OM  d) 80-90% stenoses in both distal branches of the large OM  e) Diffuse mild disease in the RCA  ??   2. Elevated left heart filling pressure (LVEDP = 20 mmHg)   3. No intervention was made.    CV Surgery:  ??  None    EP Procedures and Devices:  ??  None    Non-Invasive Evaluation(s):  TTE 10/10/2019  1. The left ventricle is normal in size with mildly increased wall  thickness.    2. The left ventricular systolic function is normal, LVEF is visually  estimated at 60-65%.    3. There is grade I diastolic dysfunction (impaired relaxation).    4. The right ventricle is normal in size, with normal systolic function.    5. The mitral valve leaflets are mildly thickened with normal leaflet  mobility.    6. The aortic valve is trileaflet with moderately thickened leaflets with  normal excursion.    7. IVC size and inspiratory change suggest mildly elevated right atrial  pressure. (5-10 mmHg).    TTE 11/13/2015  ?? Left ventricular hypertrophy - moderate  ?? Normal left ventricular systolic function, ejection fraction > 55%  ?? Dilated left atrium - mild  ?? Aortic sclerosis  ?? Normal right ventricular systolic function    Medical History:  Past Medical History:   Diagnosis Date   ??? CKD (chronic kidney disease) stage 3, GFR 30-59 ml/min 01/08/2015   ??? Coronary artery disease 10/2015    Multivessel disease, plan medical management after cath 6/17   ??? GERD (gastroesophageal reflux disease)    ??? Gout    ??? HIV (human immunodeficiency virus infection) (CMS-HCC)     Undectable viral load 5/17   ??? Hypertension    ??? Nephrolithiasis    ??? Nephrotic syndrome 03/05/2014   ??? Renal mass     Followed by neprhology, MRI 8/16 improved, felt to be benign       Surgical History:  Past Surgical History:   Procedure Laterality Date   ??? BACK SURGERY      2012   ??? PR CATH PLACE/CORON ANGIO, IMG SUPER/INTERP,W LEFT HEART VENTRICULOGRAPHY N/A 11/13/2015    Procedure: Left Heart Catheterization;  Surgeon: Orpha Bur, MD;  Location: [facility] CATH;  Service: Cardiology   ??? PR REMOVAL OF ANAL FISSURE N/A 06/29/2014    Procedure: FISSURECTOMY, INCLUDING SPHINCTEROTOMY, WHEN PERFORMED;  Surgeon: Romero Belling, MD;  Location: [facility];  Service: Gastrointestinal   ??? PR SURG DIAGNOSTIC EXAM, ANORECTAL N/A 06/29/2014    Procedure: ANORECTAL EXAM, SURGICAL, REQUIRING ANESTHESIA (GENERAL, SPINAL, OR EPIDURAL), DIAGNOSTIC;  Surgeon: Romero Belling, MD;  Location: [facility];  Service: Gastrointestinal       Social History:   reports that he quit smoking about 27 years ago. He has a 10.00 pack-year smoking history. He has never used smokeless tobacco. He reports that he does not drink alcohol and does not use drugs.      Family History:  family history includes Cancer in his father and mother; Diabetes in his brother; Heart disease (age of onset: 58) in his sister; Hypertension in his mother and sister; Kidney disease in his father; Thyroid disease in his mother.      Review of Systems:   Except as noted in the HPI, the remainder of 10 systems reviewed is negative.     Allergies:  Allergies   Allergen Reactions   ??? Colchicine Analogues Diarrhea     Have diarrhea when taken for long periods of time   ??? Tramadol Other (See Comments)     unknown tolerates morphine       Medications:   Prior to Admission medications    Medication Dose, Route, Frequency   albuterol (PROVENTIL HFA;VENTOLIN HFA) 90 mcg/actuation inhaler 2 puffs, Inhalation   allopurinoL (ZYLOPRIM) 100 MG tablet 250 mg,  Oral   amLODIPine (NORVASC) 2.5 MG tablet 7.5 mg, Oral, Every morning   aspirin (ECOTRIN) 81 MG tablet No dose, route, or frequency recorded.   atenoloL (TENORMIN) 100 MG tablet 100 mg, Oral, Daily (standard)   blood sugar diagnostic (ONETOUCH VERIO TEST STRIPS) Strp Use 2 (two) times daily E11.22   carvediloL (COREG) 12.5 MG tablet No dose, route, or frequency recorded.   cloNIDine HCL (CATAPRES) 0.1 MG tablet 0.1 mg, Oral, 3 times daily (RT)   cranberry 400 mg cap 400 mg, Oral, Daily (standard)   cyanocobalamin 1000 MCG tablet 1,000 mcg, Oral   cysteamine bitartrate (PROCYSBI) 300 mg grdp 1 each   diazepam (VALIUM) 5 MG tablet 5 mg, Oral, Nightly   dicyclomine (BENTYL) 10 mg capsule 10 mg, Oral, 2 times a day PRN   docusate sodium (COLACE) 100 MG capsule 100 mg, Oral, Every 12 hours   dolutegravir (TIVICAY) 50 mg TABLET Take 1 tablet by mouth daily   dorzolamide-timoloL (COSOPT) 22.3-6.8 mg/mL ophthalmic solution INSTRILL 1 DROP INTO BOTH EYES EACH MORNING   doxazosin (CARDURA) 4 MG tablet 4 mg, Oral, Nightly   emtricitabine-tenofovir alafen (DESCOVY) 200-25 mg tablet Take 1 tablet by mouth daily   ferrous sulfate 325 (65 FE) MG tablet 325 mg, Oral, Daily   folic acid (FOLVITE) 1 MG tablet No dose, route, or frequency recorded.   gabapentin (NEURONTIN) 300 MG capsule TAKE 1 CAPSULE BY MOUTH EVERY NIGHT AT BEDTIME FOR 4 DAYS THEN 1 CAPSULE TWICE DAILY   glimepiride (AMARYL) 4 MG tablet 4 mg, Oral, Daily   hydrALAZINE (APRESOLINE) 100 MG tablet 100 mg, Oral, 2 times a day (standard)   HYDROcodone-acetaminophen (NORCO) 5-325 mg per tablet Directions are take one tablet 30 minutes before physical therapy, then take one tablet at bedtime as needed for pain   lisinopriL (PRINIVIL,ZESTRIL) 40 MG tablet 40 mg, Oral, Daily (standard)   meTOLAzone (ZAROXOLYN) 10 MG tablet 10 mg, Oral, Daily (standard)   nitroglycerin (NITROSTAT) 0.4 MG SL tablet DISSOLVE ONE TABLET UNDER TONGUE AS NEEDED FOR CHEST PAIN EVERY 5 MINUTES AS DIRECTED   omega-3 fatty acids-fish oil 340-1,000 mg capsule 1 capsule, Oral   ondansetron (ZOFRAN) 4 MG tablet take 1 tablet by mouth every 8 hours if needed for nausea   oxyCODONE-acetaminophen (PERCOCET) 5-325 mg per tablet 1 tablet, Oral   ROCKLATAN 0.02-0.005 % Drop INT 1 GTT INTO OU QHS   sertraline (ZOLOFT) 50 MG tablet take 1 tablet by mouth once daily   SITagliptin (JANUVIA) 50 MG tablet 50 mg, Oral, Daily (standard)   tamsulosin (FLOMAX) 0.4 mg capsule TAKE 1 CAPSULE(0.4 MG) BY MOUTH DAILY   atorvastatin (LIPITOR) 20 MG tablet TAKE 1 TABLET (20 MG) BY MOUTH DAILY   BACLOFEN, BULK, TOP Apply topically. Baclofen 2%, Diclofenac 5%, Gabapentin 6%, Tetracaine 3%  Patient not taking: Reported on 12/22/2019   calcium carbonate-vitamin D2 500 mg(1,250mg ) -200 unit tablet 1 tablet, 2 times a day (standard)  Patient not taking: Reported on 12/22/2019   nystatin (MYCOSTATIN) 100,000 unit/mL suspension 5 mL, Daily (standard)  Patient not taking: Reported on 12/22/2019   spironolactone (ALDACTONE) 25 MG tablet 25 mg, Oral, Daily (standard)   torsemide (DEMADEX) 20 MG tablet 40 mg, Oral, Daily (standard)         Objective:     Vitals  BP 158/97  - Pulse 62  - Resp 18  - Ht 180.3 cm (5' 11)  - Wt 100.4 kg (221 lb 6.4 oz)  - SpO2 98%  -  BMI 30.88 kg/m??      Wt Readings from Last 3 Encounters:   12/22/19 100.4 kg (221 lb 6.4 oz)   11/22/19 99.8 kg (220 lb)   10/31/19 98.4 kg (217 lb)       Physical Exam  General:  Pleasant male sitting in chair, in nad.   Neck: Supple. No JVD.   Resp:   CTAB bilaterally with normal WOB.   Cardio:  Regular. 2/6 SEM heard best at RUSB.  Stable with prior documentation   Abdomen:   Soft, slightly distended   Extremities: Warm well-perfused bilaterally. Trace LE edema bilaterally.       Most Recent Labs   Lab Results   Component Value Date    NA 136 11/22/2019    K 4.8 11/22/2019    CL 103 11/22/2019 CO2 27.7 11/22/2019     Lab Results   Component Value Date    BUN 28 (H) 11/22/2019    BUN 40 (H) 10/31/2019    BUN 22 07/31/2019    BUN 17 05/16/2019     Lab Results   Component Value Date    Creatinine 2.38 (H) 11/22/2019    Creatinine 4.01 (H) 10/31/2019    Creatinine 2.30 (H) 07/31/2019    Creatinine 2.33 (H) 05/16/2019     Lab Results   Component Value Date    PRO-BNP 158.0 03/06/2016    PRO-BNP 50.8 01/03/2016     Lab Results   Component Value Date    Cholesterol 111 09/16/2016    Cholesterol, Total 140 01/03/2014    Triglycerides 198 (H) 09/16/2016    Triglycerides 80 01/03/2014    HDL 32 (L) 09/16/2016    HDL 53 01/03/2014    Non-HDL Cholesterol 79 09/16/2016    LDL Calculated 39 (L) 09/16/2016    LDL Cholesterol, Calculated 71 01/03/2014       -----------------------------------  Concha Se, MD  Cardiology Fellow, PGY-4

## 2019-12-22 NOTE — Unmapped (Addendum)
[name],   It was a pleasure to see you in clinic today. Your symptoms are concerning for obstructive sleep apnea thus we have made a referral to get a sleep study; the office will contact you to schedule an appointment. You can also call [phone].

## 2019-12-22 NOTE — Unmapped (Signed)
HISTORY OF PRESENT ILLNESS:      [name] is a 69 year old man with stage G4:A2 chronic kidney disease who is evaluated today for stage G4:A2 chronic kidney disease.  His leg edema remains stable.  His most recent laboratory studies showed improvement in his eGFR to 27 mL/min.  He saw hepatology in consultation who indicated that his liver status was stable.  He continues to have numbness in both feet and some unsteadiness in gait.  He is starting to walk on a more consistent basis.  His spouse was concerned about his risk of falling but he is averse to participating again in physical therapy or use of a single prong cane.  He does not have fever, chest pain, PND, or orthopnea.    MEDICATIONS:      Medication Sig   ??? albuterol (PROVENTIL HFA;VENTOLIN HFA) 90 mcg/actuation inhaler Inhale 2 puffs.   ??? allopurinoL (ZYLOPRIM) 100 MG tablet Take 250 mg by mouth.   ??? amLODIPine (NORVASC) 2.5 MG tablet Take 3 tablets (7.5 mg total) by mouth every morning.   ??? aspirin (ECOTRIN) 81 MG tablet    ??? atenoloL (TENORMIN) 100 MG tablet Take 1 tablet (100 mg total) by mouth daily.   ??? atorvastatin (LIPITOR) 20 MG tablet TAKE 1 TABLET (20 MG) BY MOUTH DAILY   ??? calcium carbonate-vitamin D2 500 mg(1,250mg ) -200 unit tablet Take 1 tablet by mouth Two (2) times a day.   ??? cloNIDine HCL (CATAPRES) 0.1 MG tablet Take 1 tablet (0.1 mg total) by mouth 3 (three) times a day.   ??? cranberry 400 mg cap Take 400 mg by mouth daily.   ??? cyanocobalamin 1000 MCG tablet Take 1,000 mcg by mouth.   ??? cysteamine bitartrate (PROCYSBI) 300 mg grdp 1 each.   ??? diazepam (VALIUM) 5 MG tablet Take 5 mg by mouth nightly.    ??? dicyclomine (BENTYL) 10 mg capsule Take 10 mg by mouth two (2) times a day as needed.    ??? docusate sodium (COLACE) 100 MG capsule Take 1 capsule (100 mg total) by mouth every twelve (12) hours.   ??? dolutegravir (TIVICAY) 50 mg Tab TABLET Take 1 tablet by mouth daily   ??? donepeziL (ARICEPT) 10 MG tablet Take 10 mg by mouth.   ??? dorzolamide-timoloL (COSOPT) 22.3-6.8 mg/mL ophthalmic solution INSTRILL 1 DROP INTO BOTH EYES EACH MORNING   ??? doxazosin (CARDURA) 4 MG tablet Take 1 tablet (4 mg total) by mouth nightly.   ??? emtricitabine-tenofovir alafen (DESCOVY) 200-25 mg tablet Take 1 tablet by mouth daily   ??? ferrous sulfate 325 (65 FE) MG tablet Take 325 mg by mouth daily with breakfast.    ??? folic acid (FOLVITE) 1 MG tablet    ??? gabapentin (NEURONTIN) 300 MG capsule TAKE 1 CAPSULE BY MOUTH EVERY NIGHT AT BEDTIME FOR 4 DAYS THEN 1 CAPSULE TWICE DAILY   ??? glimepiride (AMARYL) 4 MG tablet Take 1 tablet (4 mg total) by mouth daily.   ??? hydrALAZINE (APRESOLINE) 100 MG tablet Take 1 tablet (100 mg total) by mouth Two (2) times a day.   ??? HYDROcodone-acetaminophen (NORCO) 5-325 mg per tablet Directions are take one tablet 30 minutes before physical therapy, then take one tablet at bedtime as needed for pain   ??? lisinopriL (PRINIVIL,ZESTRIL) 40 MG tablet Take 1 tablet (40 mg total) by mouth daily.   ??? meclizine (ANTIVERT) 25 mg tablet Take 25 mg by mouth Three (3) times a day as needed.    ???  meTOLAzone (ZAROXOLYN) 10 MG tablet Take 1 tablet (10 mg total) by mouth daily.   ??? nitroglycerin (NITROSTAT) 0.4 MG SL tablet DISSOLVE ONE TABLET UNDER TONGUE AS NEEDED FOR CHEST PAIN EVERY 5 MINUTES AS DIRECTED   ??? nystatin (MYCOSTATIN) 100,000 unit/mL suspension Take 5 mL by mouth daily.    ??? omega-3 fatty acids-fish oil 340-1,000 mg capsule Take 1 capsule by mouth.   ??? ondansetron (ZOFRAN) 4 MG tablet take 1 tablet by mouth every 8 hours if needed for nausea   ??? oxyCODONE-acetaminophen (PERCOCET) 5-325 mg per tablet Take 1 tablet by mouth.   ??? ROCKLATAN 0.02-0.005 % Drop INT 1 GTT INTO OU QHS   ??? sertraline (ZOLOFT) 50 MG tablet take 1 tablet by mouth once daily   ??? SITagliptin (JANUVIA) 50 MG tablet Take 1 tablet (50 mg total) by mouth daily.   ??? spironolactone (ALDACTONE) 25 MG tablet Take 1 tablet (25 mg total) by mouth daily.   ??? tamsulosin (FLOMAX) 0.4 mg capsule TAKE 1 CAPSULE(0.4 MG) BY MOUTH DAILY   ??? torsemide (DEMADEX) 20 MG tablet Take 2 tablets (40 mg total) by mouth daily.     REVIEW OF SYSTEMS:  Constitutional: No fevers or chills. Cardiovascular: No chest pain.  Pulmonary: He had a negative sleep study in 2019.  However, he reports grunting and episodes of apnea.  His cardiologist, Dr. Lona Millard, is arranging for a home sleep study.  All other systems are reviewed and are negative except that noted in the history of present illness.    PHYSICAL EXAMINATION:      Constitutional: He is in no acute distress.  Vital signs:  BP 132/80 (BP Site: R Arm, BP Position: Sitting, BP Cuff Size: Large)  - Pulse 67  - Temp 36.7 ??C (98 ??F) (Temporal)  - Wt (!) 100.9 kg (222 lb 6.4 oz)  - BMI 31.02 kg/m??   Wt Readings from Last 3 Encounters:   12/26/19 (!) 100.9 kg (222 lb 6.4 oz)   12/22/19 100.4 kg (221 lb 6.4 oz)   11/22/19 99.8 kg (220 lb)   HEENT: Masked.    Neck: Supple without lymphadenopathy or thyromegaly. No carotid bruits.   Lungs: Clear to auscultation   Heart: Regular rate and rhythm. Normal S1-S2.   Abdomen: Soft, nontender, normoactive bowel sounds, without organomegaly or mass. No abdominal bruits.     Neurologic: Alert and oriented x 3.  Strength is normal.  DTRs are 2+ and symmetric.  There are no lateralizing sensory deficits.  Extremities: Trace pedal edema. Dorsalis pedis and posterior tibial pulses are palpable.    Node survey: No lymphadenopathy.   Musculoskeletal: No active synovitis.   Skin/Nails:  No rash or nail changes.     LABORATORY STUDIES:    Albumin/creatinine urine ratio    Collection Time: 12/26/19  9:02 AM   Result Value Ref Range    Creat U 172.3 Undefined mg/dL    Albumin Quantitative, Urine 6.3 Undefined mg/dL    Albumin/Creatinine Ratio 36.6 (H) 0.0 - 30.0 ug/mg   Magnesium Level    Collection Time: 12/26/19 10:24 AM   Result Value Ref Range    Magnesium 2.1 1.6 - 2.6 mg/dL   Phosphorus Level    Collection Time: 12/26/19 10:24 AM Result Value Ref Range    Phosphorus 4.0 2.4 - 5.1 mg/dL   Hepatic Function Panel    Collection Time: 12/26/19 10:24 AM   Result Value Ref Range    Albumin 4.2 3.4 - 5.0 g/dL  Total Protein 7.7 5.7 - 8.2 g/dL    Total Bilirubin 0.7 0.3 - 1.2 mg/dL    Bilirubin, Direct 7.32 0.00 - 0.30 mg/dL    AST 32 <=20 U/L    ALT 59 (H) 10 - 49 U/L    Alkaline Phosphatase 88 46 - 116 U/L   CBC w/ Differential    Collection Time: 12/26/19 10:24 AM   Result Value Ref Range    WBC 8.5 3.5 - 10.5 10*9/L    RBC 4.21 (L) 4.32 - 5.72 10*12/L    HGB 13.4 (L) 13.5 - 17.5 g/dL    HCT 25.4 27.0 - 62.3 %    MCV 94.3 81.0 - 95.0 fL    MCH 31.8 26.0 - 34.0 pg    MCHC 33.7 30.0 - 36.0 g/dL    RDW 76.2 83.1 - 51.7 %    MPV 8.7 7.0 - 10.0 fL    Platelet 137 (L) 150 - 450 10*9/L    nRBC 0 <=4 /100 WBCs    Neutrophils % 61.8 %    Lymphocytes % 27.0 %    Monocytes % 9.1 %    Eosinophils % 1.7 %    Basophils % 0.4 %    Absolute Neutrophils 5.2 1.7 - 7.7 10*9/L    Absolute Lymphocytes 2.3 0.7 - 4.0 10*9/L    Absolute Monocytes 0.8 0.1 - 1.0 10*9/L    Absolute Eosinophils 0.1 0.0 - 0.7 10*9/L    Absolute Basophils 0.0 0.0 - 0.1 10*9/L   Basic Metabolic Panel    Collection Time: 12/26/19 10:24 AM   Result Value Ref Range    Sodium 140 135 - 145 mmol/L    Potassium 4.2 3.4 - 4.5 mmol/L    Chloride 108 (H) 98 - 107 mmol/L    CO2 24.6 20.0 - 31.0 mmol/L    Anion Gap 7 5 - 14 mmol/L    BUN 30 (H) 9 - 23 mg/dL    Creatinine 6.16 (H) 0.60 - 1.10 mg/dL    BUN/Creatinine Ratio 12     EGFR CKD-EPI Non-African American, Male 24 (L) >=60 mL/min/1.66m2    EGFR CKD-EPI African American, Male 28 (L) >=60 mL/min/1.4m2    Glucose 84 70 - 179 mg/dL    Calcium 07.3 8.7 - 71.0 mg/dL      Serum creatinine trend:    Date Value   12/26/2019 2.59 (H)   11/22/2019 2.38 (H)   10/31/2019 4.01 (H)   10/10/2019 2.85 (H)   07/31/2019 2.30 (H)     IMPRESSION AND PLAN:    1.  Stage G4:A2 chronic kidney disease.  His eGFR continues to vary widely between 17 and 27 mL/min.  His risk of progression to end-stage kidney disease is about 42% at 5 years based on the kidney failure risk equation.  Therefore, education regarding dialysis modalities and transplant was initiated today.  He has been referred to the [facility] kidney transplant center for further evaluation to assess his candidacy for future transplantation.  ??  2.  Resistant hypertension.  His blood pressure is adequately controlled on metolazone 10 mg daily, torsemide 40 mg once daily, spironolactone 25 mg once daily, amlodipine 7.5 mg once daily, atenolol 100 mg once daily, doxazosin 4 mg daily, lisinopril 40 mg once daily, and hydralazine 100 mg twice daily.    3.  Human immunodeficiency virus disease.  He has follow-up scheduled with Dr. Tomasa Hose on 01/31/2020.    4.  Lower urinary tract symptoms.  Continue tamsulosin.    5.  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 His evaluation in June did not reveal evidence of a paraprotein or vitamin D toxicity.  ??  6.  Nephrolithiasis.    No further intervention is required at this time.  ??  7.  Irritable bowel syndrome.  Continue on dicyclomine.  ??  8.  Follow up plans.  He will return to see me in about 4 weeks.    I personally spent 50 minutes face-to-face and non-face-to-face in the care of this patient, which includes all pre, intra, and post visit time on the date of service.    Zetta Bills. Stefano Gaul, MD  Date of service 12/26/2019

## 2019-12-23 NOTE — Unmapped (Signed)
I saw and evaluated the patient, participating in the key elements of the service.  I discussed the findings, assessment and plan with the fellow and agree with fellow’s findings and plan as documented in the fellow's note.  I was immediately available for the entirety of the visit(s) and present for the key and critical portions. Devany Aja F Monifa Blanchette, MD

## 2019-12-26 ENCOUNTER — Ambulatory Visit: Admit: 2019-12-26 | Discharge: 2019-12-27 | Payer: MEDICARE | Attending: Nephrology | Primary: Nephrology

## 2019-12-26 DIAGNOSIS — K7469 Other cirrhosis of liver: Principal | ICD-10-CM

## 2019-12-26 DIAGNOSIS — I2510 Atherosclerotic heart disease of native coronary artery without angina pectoris: Principal | ICD-10-CM

## 2019-12-26 DIAGNOSIS — B20 Human immunodeficiency virus [HIV] disease: Principal | ICD-10-CM

## 2019-12-26 DIAGNOSIS — I5033 Acute on chronic diastolic (congestive) heart failure: Principal | ICD-10-CM

## 2019-12-26 DIAGNOSIS — N184 Chronic kidney disease, stage 4 (severe): Principal | ICD-10-CM

## 2019-12-26 DIAGNOSIS — N1832 Stage 3b chronic kidney disease: Principal | ICD-10-CM

## 2019-12-26 LAB — LYMPH MARKER COMPLETE, FLOW
ABSOLUTE CD16/56 CNT: 368 {cells}/uL (ref 15–1080)
ABSOLUTE CD19 CNT: 299 {cells}/uL (ref 105–920)
ABSOLUTE CD4 CNT: 736 {cells}/uL (ref 510–2320)
ABSOLUTE CD8 CNT: 851 {cells}/uL (ref 180–1520)
CD19% (B CELLS)": 13 % (ref 7–23)
CD3% (T CELLS)": 71 % (ref 61–86)
CD4% (T HELPER)": 32 % — ABNORMAL LOW (ref 34–58)
CD4:CD8 RATIO: 0.9 (ref 0.9–4.8)
CD8% T SUPPRESR": 37 % (ref 12–38)

## 2019-12-26 LAB — CBC W/ AUTO DIFF
BASOPHILS ABSOLUTE COUNT: 0 10*9/L (ref 0.0–0.1)
BASOPHILS RELATIVE PERCENT: 0.4 %
EOSINOPHILS ABSOLUTE COUNT: 0.1 10*9/L (ref 0.0–0.7)
HEMATOCRIT: 39.7 % (ref 38.0–50.0)
HEMOGLOBIN: 13.4 g/dL — ABNORMAL LOW (ref 13.5–17.5)
LYMPHOCYTES ABSOLUTE COUNT: 2.3 10*9/L (ref 0.7–4.0)
LYMPHOCYTES RELATIVE PERCENT: 27 %
MEAN CORPUSCULAR HEMOGLOBIN CONC: 33.7 g/dL (ref 30.0–36.0)
MEAN CORPUSCULAR HEMOGLOBIN: 31.8 pg (ref 26.0–34.0)
MEAN CORPUSCULAR VOLUME: 94.3 fL (ref 81.0–95.0)
MEAN PLATELET VOLUME: 8.7 fL (ref 7.0–10.0)
MONOCYTES ABSOLUTE COUNT: 0.8 10*9/L (ref 0.1–1.0)
MONOCYTES RELATIVE PERCENT: 9.1 %
NEUTROPHILS ABSOLUTE COUNT: 5.2 10*9/L (ref 1.7–7.7)
NEUTROPHILS RELATIVE PERCENT: 61.8 %
PLATELET COUNT: 137 10*9/L — ABNORMAL LOW (ref 150–450)
RED BLOOD CELL COUNT: 4.21 10*12/L — ABNORMAL LOW (ref 4.32–5.72)
RED CELL DISTRIBUTION WIDTH: 13.7 % (ref 12.0–15.0)
WBC ADJUSTED: 8.5 10*9/L (ref 3.5–10.5)

## 2019-12-26 LAB — ALBUMIN / CREATININE URINE RATIO
ALBUMIN/CREATININE RATIO: 36.6 ug/mg — ABNORMAL HIGH (ref 0.0–30.0)
CREATININE, URINE: 172.3 mg/dL

## 2019-12-26 LAB — BASIC METABOLIC PANEL
ANION GAP: 7 mmol/L (ref 5–14)
BLOOD UREA NITROGEN: 30 mg/dL — ABNORMAL HIGH (ref 9–23)
BUN / CREAT RATIO: 12
CALCIUM: 10.4 mg/dL (ref 8.7–10.4)
CHLORIDE: 108 mmol/L — ABNORMAL HIGH (ref 98–107)
CO2: 24.6 mmol/L (ref 20.0–31.0)
CREATININE: 2.59 mg/dL — ABNORMAL HIGH
EGFR CKD-EPI AA MALE: 28 mL/min/{1.73_m2} — ABNORMAL LOW (ref >=60)
EGFR CKD-EPI NON-AA MALE: 24 mL/min/{1.73_m2} — ABNORMAL LOW (ref >=60)
POTASSIUM: 4.2 mmol/L (ref 3.4–4.5)

## 2019-12-26 LAB — PHOSPHORUS: Phosphate:MCnc:Pt:Ser/Plas:Qn:: 4

## 2019-12-26 LAB — ALKALINE PHOSPHATASE: Alkaline phosphatase:CCnc:Pt:Ser/Plas:Qn:: 88

## 2019-12-26 LAB — POTASSIUM: Potassium:SCnc:Pt:Ser/Plas:Qn:: 4.2

## 2019-12-26 LAB — MAGNESIUM: Magnesium:MCnc:Pt:Ser/Plas:Qn:: 2.1

## 2019-12-26 LAB — HEPATIC FUNCTION PANEL
ALBUMIN: 4.2 g/dL (ref 3.4–5.0)
ALKALINE PHOSPHATASE: 88 U/L (ref 46–116)
ALT (SGPT): 59 U/L — ABNORMAL HIGH (ref 10–49)
AST (SGOT): 32 U/L (ref <=34)
BILIRUBIN DIRECT: 0.3 mg/dL (ref 0.00–0.30)
PROTEIN TOTAL: 7.7 g/dL (ref 5.7–8.2)

## 2019-12-26 LAB — MONOCYTES RELATIVE PERCENT: Monocytes/100 leukocytes:NFr:Pt:Bld:Qn:Automated count: 9.1

## 2019-12-26 LAB — CREATININE, URINE: Lab: 172.3

## 2019-12-26 LAB — ABSOLUTE CD4 CNT: Cells.CD3+CD4+:NCnc:Pt:XXX:Qn:: 736

## 2019-12-26 NOTE — Unmapped (Signed)
12/26/2019  Kidney Care Advocate Education Documentation  Education Tools: Kidney Care: 365 packet, brochures, DVD on home modalities  Attendees: Patients spouse, [name]    Samuel [name] is a very pleasant  69 year-old man with multiple medical co morbidities including progressive renal failure.  He has been referred for Kidney Care Education by his attending nephrologist Dr. Stefano Gaul to discuss a dialysis plan of care.    The patient has limited knowledge of kidney disease but his wife's mother was on in-center hemodialysis for several years until she passed.    The session opened with a discussion about CKD and its progression to ESRD including causality for his disease progression.  The dietary  recommendations for moderation of potassium, sodium and phosphorus was discussed.  Using the patients lab results from 11/22/2019 the focus was on the renal results and explanation of their result based on his current renal function.      The options for treatment along with the type of access for each modality was was discussed.  This included PVL for an AVF/AVG, surgical consultation for an AVF/AVG and peritoneal dialysis (PD) catheter.  The further discussion included pre-op, surgical procedure and post-op follow-up.  The patient is leaning towards PD.    The process for meeting with the home training staff for the home visit and overview of peritoneal dialysis with the home training schedule was explained.    The patient stated he really prefers a kidney transplant rather that dialysis.  I reinforced that sometimes a person has to do dialysis therapy as a bridge to kidney transplant.       This led to a discussion about kidney transplantation and the work-up involved for the recipient and donor if one is available.  The patient and his wife said Dr. Stefano Gaul referred them so they thought he was listed for a kidney.    I explained that he would need to meet with the kidney transplant nephrologist for evaluation before being listed.  The patient and his wife said they had not met with anyone.  I pulled up a copy of a letter sent to the patient dated 12/21/2019 indicating who they were to call to schedule the follow-up.  They said they did not recall seeing the letter so I printed a copy for them noting the number listed to call.   The patient was provided [facility] brochures on kidney transplantation and on being a donor along with contact information for the [facility] transplant team.  They were also provided the URL to view the virtual orientation.    I will follow-up to provide additional education and support with [name] dialysis plan of care.    Thank you for the Kidney Care Education referral.

## 2019-12-26 NOTE — Unmapped (Signed)
AOBP performed R arm, large cuff.  Average - 132/80, p 67   1st reading - 132/82, p 73   2nd reading - 130/82, p 68    3rd reading- 133/75, p 61

## 2019-12-26 NOTE — Unmapped (Signed)
Patient labs drawn in room prior to leaving Nephrology clinic

## 2019-12-26 NOTE — Unmapped (Addendum)
1. Labs today  2. Return in one month  3. Continue walking as tolerated  4. Follow up on the sleep study

## 2019-12-27 DIAGNOSIS — Z01818 Encounter for other preprocedural examination: Principal | ICD-10-CM

## 2019-12-27 LAB — HIV RNA LOG(10): Lab: 0

## 2019-12-27 LAB — HIV RNA, QUANTITATIVE, PCR
HIV RNA QNT RSLT: DETECTED — AB
HIV RNA: <40 {copies}/mL — ABNORMAL HIGH (ref <0)

## 2019-12-27 NOTE — Unmapped (Signed)
This TPA received a call from this patient to discuss scheduling Kidney Transplant Orientation.  I have scheduled for 8/30 and discussed location.

## 2020-01-01 DIAGNOSIS — N184 Chronic kidney disease, stage 4 (severe): Principal | ICD-10-CM

## 2020-01-14 DIAGNOSIS — N401 Enlarged prostate with lower urinary tract symptoms: Principal | ICD-10-CM

## 2020-01-14 MED ORDER — TAMSULOSIN 0.4 MG CAPSULE
ORAL_CAPSULE | 3 refills | 0.00000 days
Start: 2020-01-14 — End: ?

## 2020-01-15 DIAGNOSIS — E8770 Fluid overload, unspecified: Principal | ICD-10-CM

## 2020-01-15 DIAGNOSIS — N184 Chronic kidney disease, stage 4 (severe): Principal | ICD-10-CM

## 2020-01-15 MED ORDER — SPIRONOLACTONE 25 MG TABLET
ORAL_TABLET | Freq: Every day | ORAL | 3 refills | 90.00000 days | Status: CP
Start: 2020-01-15 — End: 2021-01-14

## 2020-01-15 NOTE — Unmapped (Signed)
Per last MD note pt to continue taking Spironolactone 25mg  daily. Refilled #90 with 3 refills.

## 2020-01-18 NOTE — Unmapped (Signed)
VIDEO VISIT    HISTORY OF PRESENT ILLNESS:      Samuel [name] is a 69 year old man with stage G4:A2 chronic kidney disease who is evaluated today for stage G4:A2 chronic kidney disease via a video visit.  Indicates that he has been feeling relatively well and has had excellent glycemic control with fingerstick blood glucoses ranging between 95 and 120 mg/dL.  His blood pressures, as assessed by home blood pressure monitoring, are controlled with readings averaging about 130/80 mmHg.  He does not have increased leg edema or symptoms of increased pulmonary congestion.  He has not had fever, chills, cough, or upper respiratory symptoms.     MEDICATIONS:      Medication Sig   ??? albuterol (PROVENTIL HFA;VENTOLIN HFA) 90 mcg/actuation inhaler Inhale 2 puffs.   ??? allopurinoL (ZYLOPRIM) 100 MG tablet Take 250 mg by mouth.   ??? amLODIPine (NORVASC) 2.5 MG tablet Take 3 tablets (7.5 mg total) by mouth every morning.   ??? aspirin (ECOTRIN) 81 MG tablet    ??? atenoloL (TENORMIN) 100 MG tablet Take 1 tablet (100 mg total) by mouth daily.   ??? atorvastatin (LIPITOR) 20 MG tablet TAKE 1 TABLET (20 MG) BY MOUTH DAILY   ??? calcium carbonate-vitamin D2 500 mg(1,250mg ) -200 unit tablet Take 1 tablet by mouth Two (2) times a day.   ??? cloNIDine HCL (CATAPRES) 0.1 MG tablet Take 1 tablet (0.1 mg total) by mouth 3 (three) times a day.   ??? cranberry 400 mg cap Take 400 mg by mouth daily.   ??? cyanocobalamin 1000 MCG tablet Take 1,000 mcg by mouth.   ??? cysteamine bitartrate (PROCYSBI) 300 mg grdp 1 each.   ??? diazepam (VALIUM) 5 MG tablet Take 5 mg by mouth nightly.    ??? dicyclomine (BENTYL) 10 mg capsule Take 10 mg by mouth two (2) times a day as needed.    ??? docusate sodium (COLACE) 100 MG capsule Take 1 capsule (100 mg total) by mouth every twelve (12) hours.   ??? dolutegravir (TIVICAY) 50 mg Tab TABLET Take 1 tablet by mouth daily   ??? donepeziL (ARICEPT) 10 MG tablet Take 10 mg by mouth.   ??? dorzolamide-timoloL (COSOPT) 22.3-6.8 mg/mL ophthalmic solution INSTRILL 1 DROP INTO BOTH EYES EACH MORNING   ??? doxazosin (CARDURA) 4 MG tablet Take 1 tablet (4 mg total) by mouth nightly.   ??? emtricitabine-tenofovir alafen (DESCOVY) 200-25 mg tablet Take 1 tablet by mouth daily   ??? ferrous sulfate 325 (65 FE) MG tablet Take 325 mg by mouth daily with breakfast.    ??? folic acid (FOLVITE) 1 MG tablet    ??? gabapentin (NEURONTIN) 300 MG capsule TAKE 1 CAPSULE BY MOUTH EVERY NIGHT AT BEDTIME FOR 4 DAYS THEN 1 CAPSULE TWICE DAILY   ??? glimepiride (AMARYL) 4 MG tablet Take 1 tablet (4 mg total) by mouth daily.   ??? hydrALAZINE (APRESOLINE) 100 MG tablet Take 1 tablet (100 mg total) by mouth Two (2) times a day.   ??? HYDROcodone-acetaminophen (NORCO) 5-325 mg per tablet Directions are take one tablet 30 minutes before physical therapy, then take one tablet at bedtime as needed for pain   ??? lisinopriL (PRINIVIL,ZESTRIL) 40 MG tablet Take 1 tablet (40 mg total) by mouth daily.   ??? meclizine (ANTIVERT) 25 mg tablet Take 25 mg by mouth Three (3) times a day as needed.    ??? meTOLAzone (ZAROXOLYN) 10 MG tablet Take 1 tablet (10 mg total) by mouth daily.   ???  nitroglycerin (NITROSTAT) 0.4 MG SL tablet DISSOLVE ONE TABLET UNDER TONGUE AS NEEDED FOR CHEST PAIN EVERY 5 MINUTES AS DIRECTED   ??? nystatin (MYCOSTATIN) 100,000 unit/mL suspension Take 5 mL by mouth daily.    ??? omega-3 fatty acids-fish oil 340-1,000 mg capsule Take 1 capsule by mouth.   ??? ondansetron (ZOFRAN) 4 MG tablet take 1 tablet by mouth every 8 hours if needed for nausea   ??? oxyCODONE-acetaminophen (PERCOCET) 5-325 mg per tablet Take 1 tablet by mouth.   ??? ROCKLATAN 0.02-0.005 % Drop INT 1 GTT INTO OU QHS   ??? sertraline (ZOLOFT) 50 MG tablet take 1 tablet by mouth once daily   ??? SITagliptin (JANUVIA) 50 MG tablet Take 1 tablet (50 mg total) by mouth daily.   ??? spironolactone (ALDACTONE) 25 MG tablet Take 1 tablet (25 mg total) by mouth daily.   ??? tamsulosin (FLOMAX) 0.4 mg capsule TAKE 1 CAPSULE(0.4 MG) BY MOUTH DAILY   ??? torsemide (DEMADEX) 20 MG tablet Take 2 tablets (40 mg total) by mouth daily.     REVIEW OF SYSTEMS:  Constitutional: No fevers or chills. Cardiovascular: No chest pain.  Pulmonary: He had been scheduled for a home sleep study but prefers to have this done at the sleep lab at [facility].  A referral has been placed.  All other systems are reviewed and are negative except that noted in the history of present illness.    PHYSICAL EXAMINATION:      Constitutional: He is in no acute distress.    A complete physical examination was not possible because this was a video encounter.    On the video he was alert and oriented and in no acute distress.  Home blood pressure is 130/80 mmHg.  Weight is 216 pounds.  He indicates that he has only minimal ankle edema.    Laboratory studies are pending and will be obtained at [facility]    IMPRESSION AND PLAN:    1.  Stage G4:A2 chronic kidney disease.  His clinical status is stable.  The kidney function will be reassessed with labs obtained locally at [facility].  He has completed education regarding dialysis modalities and is aware of his options for home hemodialysis home peritoneal dialysis and in center hemodialysis.  He has been referred for transplantation.  ??  2.  Resistant hypertension.  His blood pressure is well controlled.  He will continue therapy with metolazone 10 mg daily, torsemide 40 mg once daily, spironolactone 25 mg once daily, amlodipine 7.5 mg once daily, atenolol 100 mg once daily, doxazosin 4 mg daily, lisinopril 40 mg once daily, and hydralazine 100 mg twice daily.    3.  Human immunodeficiency virus disease.  He has follow-up scheduled with Dr. Tomasa Hose on 01/31/2020.    4.  Lower urinary tract symptoms.    Continue tamsulosin.    5.  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   Prior evaluation excluded a paraprotein.  ??  6.  Nephrolithiasis.    No further intervention is required at this time.  ??  7.  Irritable bowel syndrome.  Continue on dicyclomine.  ??  8.  Follow up plans.  He will return to see me in about 8 weeks (in person visit).  I will call him once his labs from [facility] return.        I spent 20 minutes on the real-time audio and video with the patient on the date of service.  I spent an additional 15 minutes on pre- and post-visit activities on the date of service.     The patient was physically located in [place] or a state in which I am permitted to provide care. The patient and/or parent/guardian understood that s/he may incur co-pays and cost sharing, and agreed to the telemedicine visit. The visit was reasonable and appropriate under the circumstances given the patient's presentation at the time.    The patient and/or parent/guardian has been advised of the potential risks and limitations of this mode of treatment (including, but not limited to, the absence of in-person examination) and has agreed to be treated using telemedicine. The patient's/patient's family's questions regarding telemedicine have been answered.     If the visit was completed in an ambulatory setting, the patient and/or parent/guardian has also been advised to contact their provider???s office for worsening conditions, and seek emergency medical treatment and/or call 911 if the patient deems either necessary.      Zetta Bills. Stefano Gaul, MD  Date of service 01/23/2020

## 2020-01-23 ENCOUNTER — Telehealth: Admit: 2020-01-23 | Discharge: 2020-01-24 | Payer: MEDICARE | Attending: Nephrology | Primary: Nephrology

## 2020-01-24 NOTE — Unmapped (Signed)
left message, called to schedule 2 month follow up appt with Dr. Stefano Gaul. Can be scheduled from the recall tab.

## 2020-01-24 NOTE — Unmapped (Signed)
[facility] Refill Coordination Note    Specialty Medication(s) to be Shipped:   Infectious Disease: Descovy and Tivicay    Other medication(s) to be shipped: No additional medications requested for fill at this time     Samuel [name], DOB: 1951-03-07  Phone: [phone] (home)       All above HIPAA information was verified with patient.     Was a Nurse, learning disability used for this call? No    Completed refill call assessment today to schedule patient's medication shipment from the [facility] [phone]).       Specialty medication(s) and dose(s) confirmed: Regimen is correct and unchanged.   Changes to medications: Samuel [name] reports no changes at this time.  Changes to insurance: No  Questions for the pharmacist: No    Confirmed patient received Welcome Packet with first shipment. The patient will receive a drug information handout for each medication shipped and additional FDA Medication Guides as required.       DISEASE/MEDICATION-SPECIFIC INFORMATION        N/A    SPECIALTY MEDICATION ADHERENCE     Medication Adherence    Patient reported X missed doses in the last month: 0  Specialty Medication: tivicay  Patient is on additional specialty medications: Yes  Additional Specialty Medications: descovy  Patient Reported Additional Medication X Missed Doses in the Last Month: 0  Patient is on more than two specialty medications: No  Informant: spouse                Descovy 200-25 mg: 7 days of medicine on hand   Tivicay 50 mg: 7 days of medicine on hand       SHIPPING     Shipping address confirmed in Epic.     Delivery Scheduled: Yes, Expected medication delivery date: 01/25/20.     Medication will be delivered via Same Day Courier to the prescription address in Epic WAM.    Samuel [name]   [facility] Specialty Technician

## 2020-01-25 MED FILL — DESCOVY 200 MG-25 MG TABLET: ORAL | 30 days supply | Qty: 30 | Fill #2

## 2020-01-25 MED FILL — TIVICAY 50 MG TABLET: 30 days supply | Qty: 30 | Fill #2 | Status: AC

## 2020-01-25 MED FILL — DESCOVY 200 MG-25 MG TABLET: 30 days supply | Qty: 30 | Fill #2 | Status: AC

## 2020-01-25 MED FILL — TIVICAY 50 MG TABLET: ORAL | 30 days supply | Qty: 30 | Fill #2

## 2020-01-26 DIAGNOSIS — E1122 Type 2 diabetes mellitus with diabetic chronic kidney disease: Principal | ICD-10-CM

## 2020-01-26 DIAGNOSIS — Z01818 Encounter for other preprocedural examination: Principal | ICD-10-CM

## 2020-01-26 DIAGNOSIS — Z125 Encounter for screening for malignant neoplasm of prostate: Principal | ICD-10-CM

## 2020-01-26 DIAGNOSIS — I10 Essential (primary) hypertension: Principal | ICD-10-CM

## 2020-01-26 DIAGNOSIS — Z21 Asymptomatic human immunodeficiency virus [HIV] infection status: Principal | ICD-10-CM

## 2020-01-26 DIAGNOSIS — N186 End stage renal disease: Principal | ICD-10-CM

## 2020-01-26 DIAGNOSIS — Z7289 Other problems related to lifestyle: Principal | ICD-10-CM

## 2020-01-26 DIAGNOSIS — N185 Chronic kidney disease, stage 5: Principal | ICD-10-CM

## 2020-01-29 ENCOUNTER — Ambulatory Visit: Admit: 2020-01-29 | Discharge: 2020-01-30 | Payer: MEDICARE | Attending: Nephrology | Primary: Nephrology

## 2020-01-29 ENCOUNTER — Ambulatory Visit: Admit: 2020-01-29 | Discharge: 2020-01-30 | Payer: MEDICARE

## 2020-01-29 ENCOUNTER — Institutional Professional Consult (permissible substitution): Admit: 2020-01-29 | Discharge: 2020-01-30 | Payer: MEDICARE

## 2020-01-29 DIAGNOSIS — N184 Chronic kidney disease, stage 4 (severe): Principal | ICD-10-CM

## 2020-01-29 DIAGNOSIS — I10 Essential (primary) hypertension: Principal | ICD-10-CM

## 2020-01-29 DIAGNOSIS — Z21 Asymptomatic human immunodeficiency virus [HIV] infection status: Principal | ICD-10-CM

## 2020-01-29 DIAGNOSIS — Z01818 Encounter for other preprocedural examination: Principal | ICD-10-CM

## 2020-01-29 LAB — LIPID PANEL
CHOLESTEROL/HDL RATIO SCREEN: 3.8 (ref 1.0–4.5)
CHOLESTEROL: 88 mg/dL (ref <=200)
HDL CHOLESTEROL: 23 mg/dL — ABNORMAL LOW (ref 40–60)
LDL CHOLESTEROL CALCULATED: 27 mg/dL — ABNORMAL LOW (ref 40–99)
NON-HDL CHOLESTEROL: 65 mg/dL — ABNORMAL LOW (ref 70–130)
TRIGLYCERIDES: 188 mg/dL — ABNORMAL HIGH (ref 0–150)
VLDL CHOLESTEROL CAL: 37.6 mg/dL (ref 12–42)

## 2020-01-29 LAB — HEPATIC FUNCTION PANEL
ALBUMIN: 4.3 g/dL (ref 3.4–5.0)
ALBUMIN: 4.3 g/dL (ref 3.4–5.0)
ALKALINE PHOSPHATASE: 81 U/L (ref 46–116)
ALKALINE PHOSPHATASE: 82 U/L (ref 46–116)
ALT (SGPT): 95 U/L — ABNORMAL HIGH (ref 10–49)
ALT (SGPT): 96 U/L — ABNORMAL HIGH (ref 10–49)
AST (SGOT): 48 U/L — ABNORMAL HIGH (ref <=34)
AST (SGOT): 51 U/L — ABNORMAL HIGH (ref <=34)
BILIRUBIN DIRECT: 0.3 mg/dL (ref 0.00–0.30)
BILIRUBIN DIRECT: 0.3 mg/dL (ref 0.00–0.30)
BILIRUBIN TOTAL: 0.9 mg/dL (ref 0.3–1.2)
BILIRUBIN TOTAL: 0.9 mg/dL (ref 0.3–1.2)
PROTEIN TOTAL: 8.1 g/dL (ref 5.7–8.2)
PROTEIN TOTAL: 8.2 g/dL (ref 5.7–8.2)

## 2020-01-29 LAB — FIBRINOGEN: FIBRINOGEN LEVEL: 324 mg/dL (ref 177–386)

## 2020-01-29 LAB — CBC W/ AUTO DIFF
BASOPHILS ABSOLUTE COUNT: 0 10*9/L (ref 0.0–0.1)
BASOPHILS RELATIVE PERCENT: 0.4 %
EOSINOPHILS ABSOLUTE COUNT: 0.2 10*9/L (ref 0.0–0.4)
EOSINOPHILS RELATIVE PERCENT: 2 %
HEMATOCRIT: 45.7 % (ref 41.0–53.0)
HEMOGLOBIN: 15 g/dL (ref 13.5–17.5)
LARGE UNSTAINED CELLS: 1 % (ref 0–4)
LYMPHOCYTES ABSOLUTE COUNT: 2 10*9/L (ref 1.5–5.0)
LYMPHOCYTES RELATIVE PERCENT: 21.1 %
MEAN CORPUSCULAR HEMOGLOBIN CONC: 32.7 g/dL (ref 31.0–37.0)
MEAN CORPUSCULAR HEMOGLOBIN: 32.1 pg (ref 26.0–34.0)
MEAN CORPUSCULAR VOLUME: 98.1 fL (ref 80.0–100.0)
MEAN PLATELET VOLUME: 9.5 fL (ref 7.0–10.0)
MONOCYTES ABSOLUTE COUNT: 0.5 10*9/L (ref 0.2–0.8)
MONOCYTES RELATIVE PERCENT: 5.1 %
NEUTROPHILS ABSOLUTE COUNT: 6.6 10*9/L (ref 2.0–7.5)
NEUTROPHILS RELATIVE PERCENT: 70 %
PLATELET COUNT: 140 10*9/L — ABNORMAL LOW (ref 150–440)
RED BLOOD CELL COUNT: 4.66 10*12/L (ref 4.50–5.90)
RED CELL DISTRIBUTION WIDTH: 14.3 % (ref 12.0–15.0)
WBC ADJUSTED: 9.5 10*9/L (ref 4.5–11.0)

## 2020-01-29 LAB — ANTITHROMBIN III: ANTITHROMB III, FUNC: 113 % (ref 83–123)

## 2020-01-29 LAB — CREATININE
CREATININE: 3.55 mg/dL — ABNORMAL HIGH
EGFR CKD-EPI AA MALE: 19 mL/min/{1.73_m2} — ABNORMAL LOW (ref >=60)
EGFR CKD-EPI NON-AA MALE: 17 mL/min/{1.73_m2} — ABNORMAL LOW (ref >=60)

## 2020-01-29 LAB — LACTATE DEHYDROGENASE: LACTATE DEHYDROGENASE: 217 U/L (ref 120–246)

## 2020-01-29 LAB — HEPATITIS B SURFACE ANTIGEN: HEPATITIS B SURFACE ANTIGEN: NONREACTIVE

## 2020-01-29 LAB — HEPATITIS B SURFACE ANTIBODY
HEPATITIS B SURFACE ANTIBODY QUANT: <8 m[IU]/mL (ref <8.00)
HEPATITIS B SURFACE ANTIBODY: NONREACTIVE

## 2020-01-29 LAB — PROTIME-INR
INR: 1.06
PROTIME: 12.6 s (ref 10.5–13.5)

## 2020-01-29 LAB — APTT
APTT: 27.8 s (ref 24.9–36.9)
HEPARIN CORRELATION: 0.2

## 2020-01-29 LAB — GAMMA GT: GAMMA GLUTAMYL TRANSFERASE: 72 U/L

## 2020-01-29 LAB — HOMOCYSTEINE: HOMOCYSTEINE PLASMA: 20.7 umol/L — ABNORMAL HIGH (ref 3.7–13.9)

## 2020-01-29 LAB — HLA ANTIBODY SCREEN

## 2020-01-29 LAB — BUN: BLOOD UREA NITROGEN: 46 mg/dL — ABNORMAL HIGH (ref 9–23)

## 2020-01-29 LAB — HEPATITIS C ANTIBODY: HEPATITIS C ANTIBODY: REACTIVE — AB

## 2020-01-29 LAB — PSA: PROSTATE SPECIFIC ANTIGEN: 0.35 ng/mL (ref 0.00–4.00)

## 2020-01-29 LAB — HEPATITIS A IGG: HEPATITIS A IGG: REACTIVE — AB

## 2020-01-29 LAB — HEPATITIS B CORE ANTIBODY, TOTAL: HEPATITIS B CORE TOTAL ANTIBODY: REACTIVE — AB

## 2020-01-29 LAB — URIC ACID: URIC ACID: 7.9 mg/dL

## 2020-01-29 NOTE — Unmapped (Signed)
I met with Samuel [name] regarding his current transplant insurance plan benefits. I discussed the Financial Agreement for Transplant including, current insurance benefits, prescription drug benefits, estimates of possible post-transplant medications and potential out-of-pocket expenses.    I recommend that [employer]:  Consider fundraising to help with transplant expenses    Samuel [name] signed the Financial Agreement for Transplant acknowledging that  he read and understands the agreement.

## 2020-01-29 NOTE — Unmapped (Signed)
PATIENT NAME: Samuel [name]   MR#: [id number]    DOB: 10-26-1950      [facility]  CONFIDENTIAL SOCIAL WORK  KIDNEY TRANSPLANT ASSESSMENT         DATE OF EVALUATION: 01/29/2020    INFORMANTS: Patient/Tor J Narducci, [name]/spouse    PREFERRED LANGUAGE: English     TRANSPLANT PHASE:  Evaluation   TRANSPLANT STATUS:  Active     The limits of confidentiality and the purpose of the evaluation were reviewed. The patient was provided with a verbal description of the nature and purpose of the psychological evaluation. I also reviewed the referral source, specific referral question for this evaluation, foreseeable risks/discomforts, benefits, limits of confidentiality, and mandatory reporting requirements of this provider. The patient was given the opportunity to ask questions and receive answers about the present evaluation. Oral consent was provided by the patient.     PRESENTING MEDICAL PROBLEMS AND RELEVANT HISTORY:   Samuel [name] is a 69 y.o. African American male who  has a past medical history of CKD (chronic kidney disease) stage 3, GFR 30-59 ml/min (01/08/2015), Coronary artery disease (10/2015), GERD (gastroesophageal reflux disease), Gout, HIV (human immunodeficiency virus infection) (CMS-HCC), Hypertension, Nephrolithiasis, Nephrotic syndrome (03/05/2014), and Renal mass.     FUNCTIONAL STATUS:   Samuel [name] presents as independent with all personal ADLs, including bathing, dressing, cooking, and household chores.      DME: vision is compromised and spouse thinks he should use a cane due to his vision and not being able to stand for long periods of time, but he refuses  Activity level/Functional Status: active with family life, volunteers with spouse a couple times a week    UNDERSTANDING OF MEDICAL CONDITION AND TRANSPLANT:   Samuel [name] and spouse attended the Transplant Orientation in 2021.    Patient verbalizes understanding that he is not aware about caused his kidney disease. He has been following with  [facility] nephrologist/Hadlik who is encouraging him to be proactive with his care and txp.    Pt understands medical illness process: Poor  Pt understands transplant process/psychosocial risks: Limited  Education provided: orientation class information regarding transplant process and guidelines were reviewed, including psychosocial risks.    SUPPORT PLAN FOR TRANSPLANTATION:     Plan details: spouse will be his primary caregiver. They have not talked about transplant with the rest of the family as they wanted to have all the information first. However, they do expect them to be very supportive and most likely his granddaughter, Shanda Bumps will be his back up support, since she lives closest to them.    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      Advance Directive: Education and form were provided.  <no information>    DIALYSIS HISTORY:    Preparing for PD.    Transportation:   Self: no, spouse takes him to all appts  Distance from home to [facility]: [address]  [place] [address]    ATTITUDE ABOUT TRANSPLANT:  Expectations: to spend time with family and see his great-grandchildren grow-up  Fears/Concerns: fear of the unknown, specially dialysis at this time    SOCIAL HISTORY:   Citizenship  Status: [place] C  Marital Status: married 68 years  Lives with: spouse  Children/Dependents:  2 adult children, 6 adult grandchildren, 3 great-grandchildren   Social support: family  Housing: good repair    COMPLIANCE HISTORY:   Problems obtaining medications or attending appointments (including transportation):  denies  Problems organizing medications: denies  Adherence to treatment/medications: yes, patient organizes and uses a pill box    EDUCATION AND WORK HISTORY:   Highest completed grade level: HS  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 Provided information on 211 and local resources    STRESSORS/COPING STYLE: dialysis anticipation. To relax he enjoys spending time with his family and great-grandchildren, socializing and talking to people.   Religious/Spiritual beliefs: Ephriam Knuckles - patient was a Education officer, environmental for 16 years    PSYCHIATRIC HISTORY:   Current issues/mood: up and down Spouse reports his health issues are what makes him few low  Past issues: depression  Medications: Zoloft 50  Therapy: no  SI/HI: denies  Hospitalizations: denies  Loss/Trauma/Violence:   Mania: denies  Psychosis: denies  Hx of adverse reactions to treatment/medication/steroids: denies  PHQ-2 Score: 1/6  - patient and spouse report lack of motivation as he is tired often and sleeps a lot. Spouse reports he falls asleep when sitting down and they have a sleep study scheduled     GAD-7 Score: 1/6 - patient worries about his health, but says his family support helps with coping     They agreed to discuss any changes in the patient's mood or behavior with the medical team.    MENTAL STATUS:   Affect: normal, mood congruent  Appearance: well groomed hair, well dressed, season and temperature appropriate  Attention Span: normal attention span  Attitude: friendly, cooperative  Behavior: calm, cooperative, appropriate eye contact, no psychomotor agitation/retardation noted  Insight & Judgement: intact/appropriate, reliable insight  Level of Consciousness: alert  Mood: euthymic/normal/stable  Orientation: person, place, time, date  Speech: normal speech  Thought Content: logical connections    COGNITIVE HISTORY & HEALTH LITERACY:   Samuel [name] does  seem cognitively intact. He denies history of memory or cognitive concerns related to his health, spouse says he does get confused sometimes, for example, he was talking with his brother-in-law and telling him his sister was not his sister - they deny any other concerns and cognition will nee to be monitored . There were concerns for health literacy.     Do you need to have someone help you when you read instructions, pamphlets, or other written material from your doctor or pharmacy? No.      Hx of special education or learning challenges: no history of educational challenges    SUBSTANCE HISTORY:   Tobacco: Darnel  reports that he quit smoking about 27 years ago. He has a 10.00 pack-year smoking history. He has never used smokeless tobacco.  Alcohol: couple denies significant history of drinking   Illicit Substances: past  history of heroine and marijuana use. Patient quit in 1983, because it was causing too much trouble in his life and he felt he was not a good example for his children.     CHRONIC PAIN HISTORY (and referral needs): gabapentin     LEGAL HISTORY:   A review of [place] DPS Offender Public Information website revealed incarceration in 1983.     COLLATERAL CONTACT:  Samuel [name] was interviewed with his spouse and they confirmed plan.    EDUCATION:  KIDNEY Transplant Process  Psychosocial risks of transplant, including depression, anxiety, guilt, PTSD  CKD/ESRD can often cause stress on the entire family unit, not just the patient  Advance Care Planning  Long-term financial and vocational planning  Expectations for support planning both pre- and post-transplant  Transplant social worker availability throughout transplant process  Proofreader and Healthcare Power of Attorney Forms  Kidney Transplant Patients and their Support Persons  [employer]    ASSESSMENT AND RECOMMENDATIONS:  Samuel [name] is a 69 y.o. male who presents friendly and oriented. His spouse appears to be a strong source of support. They have not talked to family about transplant to date, but they do expect their children and grandchildren to be very supportive. Most likely his granddaughter, Shanda Bumps, will be his back up support, since she lives closest to them. Patient appears to be at higher risk for health literacy and education reiteration will be needed.     He denies history of memory or cognitive concerns related to his health, spouse says he does get confused sometimes, for example, he was talking with his brother-in-law and telling him his sister was not his sister - they deny any other concerns and cognition will nee to be monitored. Patient reports mood up and down due to worries about dialysis. He has a history of depression and is taking Zoloft. His spouse reports his mood concerns are related to his health, but denies they are significant at this time. PHQ-2 Score: 1/6  - patient and spouse report lack of motivation as he is tired often and sleeps a lot. Spouse reports he falls asleep when sitting down and they have a sleep study scheduled. Patient reports strong social support from family is his way to cope with stress. To relax he enjoys spending time with his family and great-grandchildren, socializing and talking to people.     Patient has vision impairment and spouse thinks he should use a cane due to his vision and not being able to stand for long periods of time, but he refuses. He has a history of substance use disorder that ended in 1983. They report financial strains and SW provided local resources through [employer]. The patient has a number of psychosocial strengths, including motivation for transplant, adherence to medical treatment, denial of substance abuse concerns and strong support from spouse    SIPAT SCORE: 27  - High risk health literacy  - Hx depression - to be monitored  - Cognition - to be monitored  - Hx substance use disorder - in remission since 1983    Final decision regarding listing status is based upon committee review at selection meeting.    Recommendations and Plan: Not a condition to listing/ keep active:  - Note only patient and spouse knows about his HIV status - please do not discuss in front of other family members  -Patient encouraged to fundraise for future medical expenses  -SW to monitor mood and cognition  -Patient to  complete HC/POA as desired - education and form provided  - Couple to discuss txp with family  -SW to see patient yearly    Telehealth visit is appropriate for updates    CSW to provide continued support and encouragement.    Thomasene Mohair, LCSW, CCTSW  Transplant Social Worker/Case Manager  [facility]

## 2020-01-29 NOTE — Unmapped (Signed)
[facility]ANS[facility]PHROLOGY    Kidney Transplant Evaluation       Referring MD: Dr. Jackelyn Poling    Primary Care Provider: Dr. Barbette Reichmann    ID: Dr. Karel Jarvis, [place]    HISTORY OF PRESENT ILLNESS:   Samuel [name] is a 69 y.o. male with Chronic Kidney Disease Stage 4 likely secondary to history of resistant hypertension.  He had a recent creatinine of 2.59 with an eGFR of 28 ml/min on 12/26/2019.  He had a previous eGFR of 17 on 10/31/2019.  He is being seen in consultation at the request of Dr. Jackelyn Poling for evaluation regarding his candidacy for kidney transplantation.     Samuel [name] has a history of resistant hypertension for over 20+ years. He was diagnosed as HIV positive in 2001 and has been on therapy with compliance. His last HIV viral load was negative in 09/2019. He is followed at [facility]. He has a history of Hepatitis C with treatment with Zepatier and HCV clearance. He has a history of CAD with NSTEMI in 2017. His heart cath in 2017 showed CAD with medical therapy recommended. He has had recent increased symptoms of fatigue, daytime drowsiness, snoring according to his wife, and occasional SOB at rest. He will be having a sleep study on 10/21 to evaluate for sleep apnea. He reports being diagnosed with Type 2 diabetes and is only on oral medications. He has a history of nephrolithiasis with past lithotripsy but no recent stones. He  denies any known history of stroke, lung disease, or cancer.     Currently, he reports having fatigue and notes occasional SOB at rest. He denies any chest pain or pressure. His wife reports that he snores often. His appetite is good with no N/V. He has changed his diet, working to lose weight, and is eating a more healthy diet now. He denies any leg swelling, fever/chills, cough, or abdominal pain.     PAST MEDICAL HISTORY:  1. Chronic Kidney Disease Stage 4 likely secondary to history of resistant hypertension.  2. Resistant hypertension for over 20+ years.   3. HIV positive, diagnosed in 2001,on therapy with compliance. Last HIV viral load negative in 09/2019. Followed at [facility].   4. History of Hepatitis C+ with treatment with Zepatier, had HCV clearance.  5. History of CAD with NSTEMI in 2017. Heart cath in 2017 showed CAD with medical therapy recommended.   6. Recent  increased symptoms of fatigue, daytime drowsiness, snoring according to his wife, and occasional SOB at rest. Suspected sleep apnea, upcoming sleep study on 10/21 for evaluation.   7. Type 2 diabetes, only on oral medications.   8. Past history of nephrolithiasis with lithotripsy, no recent stones.  9. Abdominal U/S stable with LI-RADS-1 (negative), borderline splenomegaly.     ALLERGIES:Colchicine caused diarrhea. Did not tolerate Tramadol.     MEDICATIONS:   Prior to Admission medications    Medication Sig Start Date End Date Taking? Authorizing Provider   albuterol (PROVENTIL HFA;VENTOLIN HFA) 90 mcg/actuation inhaler Inhale 2 puffs. Take as needed 08/12/16  Yes Historical Provider, MD   allopurinoL (ZYLOPRIM) 100 MG tablet Take 250 mg by mouth daily.  10/06/19  Yes Historical Provider, MD   amLODIPine (NORVASC) 2.5 MG tablet Take 3 tablets (7.5 mg total) by mouth every morning. 09/19/19  Yes Sharlett Iles, MD   aspirin (ECOTRIN) 81 MG tablet daily.  08/22/16  Yes Historical Provider, MD   atenoloL (TENORMIN) 100 MG tablet Take 1 tablet (100  mg total) by mouth daily. 09/19/19 09/18/20 Yes Sharlett Iles, MD   BACLOFEN, BULK, TOP Apply topically. Baclofen 2%, Diclofenac 5%, Gabapentin 6%, Tetracaine 3%    Take as needed   Yes Historical Provider, MD   blood sugar diagnostic (ONETOUCH VERIO TEST STRIPS) Strp Use 2 (two) times daily E11.22 01/12/19  Yes Historical Provider, MD   carvediloL (COREG) 12.5 MG tablet 12.5 mg Two (2) times a day.  12/07/19  Yes Historical Provider, MD   cloNIDine HCL (CATAPRES) 0.1 MG tablet Take 1 tablet (0.1 mg total) by mouth 3 (three) times a day. 09/20/19 09/19/20 Yes Sharlett Iles, MD   cranberry 400 mg cap Take 400 mg by mouth daily.   Yes Historical Provider, MD   cysteamine bitartrate (PROCYSBI) 300 mg grdp 1 each. 07/18/19 07/17/20 Yes Historical Provider, MD   diazepam (VALIUM) 5 MG tablet Take 5 mg by mouth nightly.  01/02/14  Yes Historical Provider, MD   dicyclomine (BENTYL) 10 mg capsule Take 10 mg by mouth two (2) times a day as needed.  10/27/18  Yes Historical Provider, MD   docusate sodium (COLACE) 100 MG capsule Take 1 capsule (100 mg total) by mouth every twelve (12) hours.  Patient taking differently: Take 100 mg by mouth every twelve (12) hours. Take as needed 05/24/14  Yes Rolena Infante, MD   dolutegravir (TIVICAY) 50 mg TABLET Take 1 tablet by mouth daily 11/01/19 10/31/20 Yes Adaora Janalee Dane, MD   dorzolamide-timoloL (COSOPT) 22.3-6.8 mg/mL ophthalmic solution Administer 1 drop to both eyes Two (2) times a day.  11/30/18  Yes Historical Provider, MD   doxazosin (CARDURA) 4 MG tablet Take 1 tablet (4 mg total) by mouth nightly. 09/19/19 09/18/20 Yes Sharlett Iles, MD   emtricitabine-tenofovir alafen (DESCOVY) 200-25 mg tablet Take 1 tablet by mouth daily 11/01/19 10/31/20 Yes Adaora Janalee Dane, MD   ferrous sulfate 325 (65 FE) MG tablet Take 325 mg by mouth daily with breakfast.  01/10/15  Yes Historical Provider, MD   folic acid (FOLVITE) 1 MG tablet  01/09/14  Yes Historical Provider, MD   gabapentin (NEURONTIN) 300 MG capsule 300 mg Two (2) times a day.  09/26/19  Yes Historical Provider, MD   glimepiride (AMARYL) 4 MG tablet Take 1 tablet (4 mg total) by mouth daily. 09/19/19 09/18/20 Yes Sharlett Iles, MD   hydrALAZINE (APRESOLINE) 100 MG tablet Take 1 tablet (100 mg total) by mouth Two (2) times a day. 09/19/19 09/18/20 Yes Sharlett Iles, MD   HYDROcodone-acetaminophen [facility]) 5-325 mg per tablet Directions are take one tablet 30 minutes before physical therapy, then take one tablet at bedtime as needed for pain 02/20/16  Yes Historical Provider, MD lisinopriL (PRINIVIL,ZESTRIL) 40 MG tablet Take 1 tablet (40 mg total) by mouth daily. 09/19/19 09/18/20 Yes Sharlett Iles, MD   meTOLAzone (ZAROXOLYN) 10 MG tablet Take 1 tablet (10 mg total) by mouth daily. 10/10/19 10/09/20 Yes Sharlett Iles, MD   nitroglycerin (NITROSTAT) 0.4 MG SL tablet DISSOLVE ONE TABLET UNDER TONGUE AS NEEDED FOR CHEST PAIN EVERY 5 MINUTES AS DIRECTED 12/05/18  Yes Brantley Persons, MD   nystatin (MYCOSTATIN) 100,000 unit/mL suspension Take 5 mL by mouth daily.  10/08/15  Yes Historical Provider, MD   omega-3 fatty acids-fish oil 340-1,000 mg capsule Take 1 capsule by mouth.   Yes Historical Provider, MD   ondansetron (ZOFRAN) 4 MG tablet take 1 tablet by mouth every 8 hours if needed for nausea  03/09/16  Yes Historical Provider, MD   oxyCODONE-acetaminophen (PERCOCET) 5-325 mg per tablet Take 1 tablet by mouth daily.    Yes Historical Provider, MD   ROCKLATAN 0.02-0.005 % Drop INT 1 GTT INTO OU QHS 11/11/18  Yes Historical Provider, MD   sertraline (ZOLOFT) 50 MG tablet take 1 tablet by mouth once daily 02/21/16  Yes Historical Provider, MD   SITagliptin (JANUVIA) 50 MG tablet Take 1 tablet (50 mg total) by mouth daily. 09/19/19 09/18/20 Yes Sharlett Iles, MD   spironolactone (ALDACTONE) 25 MG tablet Take 1 tablet (25 mg total) by mouth daily. 01/15/20 01/14/21 Yes Sharlett Iles, MD   tamsulosin [facility]) 0.4 mg capsule TAKE 1 CAPSULE(0.4 MG) BY MOUTH DAILY 10/26/18  Yes Sharlett Iles, MD   calcium carbonate-vitamin D2 500 mg(1,250mg ) -200 unit tablet Take 1 tablet by mouth Two (2) times a day.   Patient not taking: Reported on 01/29/2020    Historical Provider, MD   torsemide (DEMADEX) 20 MG tablet Take 2 tablets (40 mg total) by mouth daily. 10/18/18 11/22/19  Sharlett Iles, MD         SOCIAL HISTORY: Married. Lives with wife; she came with him today. He has 2 grown children.  Occupation:Retired, was a Geologist, engineering.   Smoking: Past in the 1980s. Quit in 1983. Alcohol use: None  Illicit drug use: Denies    FAMILY HISTORY: His mother is deceased and had kidney disease and diabetes. His father is deceased and had cancer (?liver). He has a brother and 2 sisters who are deceased. He has a sister and brother who are living.   Possible living donors: None yet. Plans to seek one.      SCREENING EVALUATIONS:  1. Cardiac: Echo in 09/2019 showed LVEF of 60-65%, grade 1 diastolic dysfunction. Last heart cath in 2017, see previously.   2. Colonoscopy: Due now  3. PSA: 0.35 on 01/29/2020    PHYSICAL EXAM:  General: Well-appearing, pleasant male in NAD.   HEENT: Normocephalic.   Cardiac: RRR, no rubs or gallops.   Lungs: Clear to auscultation.   Abdomen: Soft, non-tender, positive bowel sounds.   Extremities: No edema noted.   Skin: No rashes or skin lesions noted.   Neuro: Alert and oriented, appropriate affect.       ASSESSMENT & PLAN:  1.  Overall, we believe that [employer] will likely be a good candidate for kidney transplant listing at [facility]. We will proceed with his transplant evaluation as listed below.   2.  Cardiac Evaluation: He needs an updated stress test due to recent SOB and fatigue symptoms. He had a recent echo. He needs to see our transplant cardiologist for cardiac evaluation and clearance.   3.  Due to history of DM and his age, he needs a CT of abdomen and pelvis to evaluate iliacs and other blood vessels to determine if he has acceptable targets for transplant surgery.   4.  Regarding his HIV+ status, we will request clearance from his [facility] ID provider.   5.  He needs to complete his upcoming sleep study to determine if he has sleep apnea and to start therapy as indicated.   6.  Screening Tests: He needs an updated colonoscopy. We will check a PSA level with his labs.    7.  Living Donors: None at this time but he has not shared his need with family or others yet. The patient and his wife received contact information for our living  donor coordinator to share with any possible living donor candidates.   8.  We reviewed other options for kidney donors, including Hepatitis C+ donor kidneys followed by treatment, higher KDPI donor kidneys, or possible listing at another transplant center in our area. He was agreeable to Hep C+ and higher KDPI donors and signed consent forms today.  We also discussed that he does not need a kidney transplant right now due to recently improved eGFR.  We did discuss the goal of getting him listed soon to begin waiting time accrual.   7.  In summary, we believe that Carrolyn Leigh will likely be a good candidate for kidney transplant, pending cardiac clearance. We will proceed with his evaluation as outlined above. We would like to get him listed with inactive status to begin accruing waiting time on the transplant waiting list for now.     This patient was seen in conjunction with Dr. Leeroy Bock who was in agreement with the stated plan above.     I personally spent 60 minutes face-to-face and non-face-to-face in the care of this patient, which includes all pre, intra, and post visit time on the date of service.

## 2020-01-29 NOTE — Unmapped (Signed)
Patient attended kidney transplant orientation class at [facility] today and was instructed to attend the rest of their scheduled appointments. Orientation class was 1 hour and 0 minutes long.  First half of patient education checklist signed today.    Patient did and did not sign HCV and greater than 85% KDPI consent(s).

## 2020-01-30 LAB — SYPHILIS RPR SCREEN: Reagin Ab:PrThr:Pt:Ser:Ord:RPR: NONREACTIVE

## 2020-01-30 LAB — BETA-2 GLYCOPROTEIN ANTIBODIES: BETA-2 GLYCOPROTEIN 1 IGG ANTIBODY: 3 (ref <20)

## 2020-01-30 LAB — HIV RNA, QUANTITATIVE, PCR: HIV RNA QNT RSLT: NOT DETECTED

## 2020-01-30 LAB — QUANTIFERON TB GOLD PLUS
Lab: NEGATIVE
QUANTIFERON ANTIGEN 1 MINUS NIL: 0.01 [IU]/mL
QUANTIFERON ANTIGEN 2 MINUS NIL: 0.02 [IU]/mL

## 2020-01-30 LAB — ANTICARDIOLIPIN IGM ANTIBODY: Lab: 2

## 2020-01-30 LAB — LYMPH MARKER COMPLETE, FLOW
ABSOLUTE CD19 CNT: 272 {cells}/uL (ref 105–920)
ABSOLUTE CD4 CNT: 649 {cells}/uL (ref 510–2320)
ABSOLUTE CD8 CNT: 796 {cells}/uL (ref 180–1520)
CD16/56%NK CELL": 16 % (ref 1–27)
CD19% (B CELLS)": 13 % (ref 7–23)
CD3% (T CELLS)": 70 % (ref 61–86)
CD4% (T HELPER)": 31 % — ABNORMAL LOW (ref 34–58)
CD4:CD8 RATIO: 0.8 — ABNORMAL LOW (ref 0.9–4.8)
CD8% T SUPPRESR": 38 % (ref 12–38)

## 2020-01-30 LAB — TB MITOGEN VALUE: Lab: 10

## 2020-01-30 LAB — CD19% (B CELLS)": Lab: 13

## 2020-01-30 LAB — HEMOGLOBIN A1C: Hemoglobin A1c/Hemoglobin.total:MFr:Pt:Bld:Qn:: 5.8 — ABNORMAL HIGH

## 2020-01-30 LAB — HIV RNA COMMENT: Lab: 0

## 2020-01-30 LAB — TB NIL VALUE: Lab: 0.07

## 2020-01-30 LAB — BETA-2 GLYCOPROTEIN 1 IGG ANTIBODY: Lab: 3

## 2020-01-30 LAB — TB AG1 VALUE: Lab: 0.08

## 2020-01-30 LAB — TB AG2 VALUE: Lab: 0.09

## 2020-01-30 NOTE — Unmapped (Signed)
Assessment/Plan:              HIV:  Viral load below detectable yesterday (8/30) on current ART (Descovy/dolutegravir), which he tolerates well. I reviewed chart in anticipation of today's visit. We discussed labs.  Will continue current regimen. Repeat HBV vaccine (Heplisav) given today, as he did not mount HBV sAb response following previous vaccination. HBV DNA requested, given HBV cAb positivity.  Cirrhosis well compensated. Renal insufficiency continue to be his biggest current medical problem; he is being evaluated for kidney transplant and so far appears to be a good candidate (pending completion of cardiovascular exam). I see no HIV-related contraindications to kidney transplantation.        Obesity: Has worked with nutrition, improved his diet, and is trying to exercise.    S/p L shoulder surgery:  Followed by his PCP.    Cirrhosis: Followed by GI.     SVR following Zepatier for Chronic HCV Infection (genotype 1a): Stable.    CKD: Further progression to Stage G4:A2-- Followed by Dr Stefano Gaul, who has referred him to Transplant Hepatology, given concern for worsened GFR in setting of cirrhosis.    HFpEF: Followed by Cardiology.    HTN: Stable.    Sexual Health:     Mental Health: Stable.    Health maintenance:   Reports COVID vaccine x 2 at [facility] at least 2 mos ago (~07/2019)  Pap N/A  RPR False + (1:2 with neg FTA 11/12/2015)  GC/CT - neg 08/26/2016  Hb A1C 01/29/2020: 5.8   Cholesterol See below  TB screening neg, 01/29/2020  Lung cancer screening N/A (quit 1993 after 1 ppd x 10 yrs)  Colonoscopy - Requested for screening    Pneumococcal and second Shingrix vaccine requested today.        Prevention, adherence and health education:   .  .  .  .  .  .  .    Educational and counseling services took 25  minutes of today's visit.    Follow-up:  Return to clinic in 1 month or sooner if needed.   Subjective:    Samuel [name] is a 69 y.o. male who presents to the Infectious Disease clinic for return HIV visit. Chief Complaint: Routine Follow-up      HPI:   HIV diagnosed in 12/1999. In 07/13/00, CD4 count was 547 (25%) with a viral load of about 5293. For a number of years, his viral load and CD4 count were generally reasonably well maintained in the absence of antiretroviral therapy, and he never had any HIV-related symptoms. However, during 2008-2009, his viral load dramatically increased and his CD4 count fell. Viral load reached a height of 309,000 on 08/31/2007 and CD4 count fell substantially to 244 on 09/29/2007. He had always been reluctant to take antiviral medications and in fact consistently and repeatedly refused hepatitis C treatment as well as HIV treatment in the past. However, because of the deterioration of his CD4 count and viral load, I prescribed Atripla on 10/12/07. Although he agreed to take it he did not done so until more than a year later, despite assurances during each visit that he would begin ART immediately. Once he finally started ART, however, he tolerated it quite well. On 07/24/2015 he was switched from Atripla to Descovy/dolutegravir b/o CKD. Course has been complicated by DM (well controlled), well compensated cirrhosis, NSTEMI in 2017, and progressively worsened renal disease (now Stage 4).     His HIV has been well controlled, but he has  multiple severe comorbidities, including HCV (SVR after 12 wks of Zepatier 04/20/2016-07/13/2016; HCV RNA BDL 10/28/2016), resultant cirrhosis (Metavir F4), CKD now Stage 4:A2, NSTEMI 10/2015. He is followed closely by Nephrology. Also followed by GI, Cardiology, and PCP (Dr Marcello Fennel). He is being evaluated for renal transplantation.      Has missed no ART doses since I last saw him. Generally feels well -- I feel good. Working on exercise. Gets 3K-5K steps per day on Apple Watch. Uses gym in building where wife works. Gets SOB with exercise. Sleep test planned per patient. C-pap recommended by cardiologist.     ROS otherwise unremarkable.         Past Medical History:   Diagnosis Date   ??? CKD (chronic kidney disease) stage 3, GFR 30-59 ml/min 01/08/2015   ??? Coronary artery disease 10/2015    Multivessel disease, plan medical management after cath 6/17   ??? GERD (gastroesophageal reflux disease)    ??? Gout    ??? HIV (human immunodeficiency virus infection) (CMS-HCC)     Undectable viral load 5/17   ??? Hypertension    ??? Nephrolithiasis    ??? Nephrotic syndrome 03/05/2014   ??? Renal mass     Followed by neprhology, MRI 8/16 improved, felt to be benign       Medications:  Current Outpatient Medications   Medication Sig Dispense Refill   ??? albuterol (PROVENTIL HFA;VENTOLIN HFA) 90 mcg/actuation inhaler Inhale 2 puffs. Take as needed     ??? allopurinoL (ZYLOPRIM) 100 MG tablet Take 250 mg by mouth daily.      ??? amLODIPine (NORVASC) 2.5 MG tablet Take 3 tablets (7.5 mg total) by mouth every morning. 90 tablet 3   ??? aspirin (ECOTRIN) 81 MG tablet daily.   0   ??? atenoloL (TENORMIN) 100 MG tablet Take 1 tablet (100 mg total) by mouth daily. 90 tablet 3   ??? BACLOFEN, BULK, TOP Apply topically. Baclofen 2%, Diclofenac 5%, Gabapentin 6%, Tetracaine 3%    Take as needed     ??? blood sugar diagnostic (ONETOUCH VERIO TEST STRIPS) Strp Use 2 (two) times daily E11.22     ??? calcium carbonate-vitamin D2 500 mg(1,250mg ) -200 unit tablet Take 1 tablet by mouth Two (2) times a day.      ??? carvediloL (COREG) 12.5 MG tablet 12.5 mg Two (2) times a day.      ??? cloNIDine HCL (CATAPRES) 0.1 MG tablet Take 1 tablet (0.1 mg total) by mouth 3 (three) times a day. 270 tablet 3   ??? cranberry 400 mg cap Take 400 mg by mouth daily.     ??? cysteamine bitartrate (PROCYSBI) 300 mg grdp 1 each.     ??? diazepam (VALIUM) 5 MG tablet Take 5 mg by mouth nightly.      ??? dicyclomine (BENTYL) 10 mg capsule Take 10 mg by mouth two (2) times a day as needed.      ??? docusate sodium (COLACE) 100 MG capsule Take 1 capsule (100 mg total) by mouth every twelve (12) hours. (Patient taking differently: Take 100 mg by mouth every twelve (12) hours. Take as needed) 60 capsule 0   ??? dolutegravir (TIVICAY) 50 mg TABLET Take 1 tablet by mouth daily 30 tablet 11   ??? dorzolamide-timoloL (COSOPT) 22.3-6.8 mg/mL ophthalmic solution Administer 1 drop to both eyes Two (2) times a day.      ??? doxazosin (CARDURA) 4 MG tablet Take 1 tablet (4 mg total) by mouth nightly. 90 tablet  3   ??? emtricitabine-tenofovir alafen (DESCOVY) 200-25 mg tablet Take 1 tablet by mouth daily 30 tablet 11   ??? ferrous sulfate 325 (65 FE) MG tablet Take 325 mg by mouth daily with breakfast.      ??? folic acid (FOLVITE) 1 MG tablet   0   ??? gabapentin (NEURONTIN) 300 MG capsule 300 mg Two (2) times a day.      ??? glimepiride (AMARYL) 4 MG tablet Take 1 tablet (4 mg total) by mouth daily. 90 tablet 3   ??? hydrALAZINE (APRESOLINE) 100 MG tablet Take 1 tablet (100 mg total) by mouth Two (2) times a day. 180 tablet 3   ??? HYDROcodone-acetaminophen (NORCO) 5-325 mg per tablet Directions are take one tablet 30 minutes before physical therapy, then take one tablet at bedtime as needed for pain     ??? lisinopriL (PRINIVIL,ZESTRIL) 40 MG tablet Take 1 tablet (40 mg total) by mouth daily. 90 tablet 3   ??? meTOLAzone (ZAROXOLYN) 10 MG tablet Take 1 tablet (10 mg total) by mouth daily. 90 tablet 3   ??? nitroglycerin (NITROSTAT) 0.4 MG SL tablet DISSOLVE ONE TABLET UNDER TONGUE AS NEEDED FOR CHEST PAIN EVERY 5 MINUTES AS DIRECTED 25 tablet 0   ??? nystatin (MYCOSTATIN) 100,000 unit/mL suspension Take 5 mL by mouth daily.      ??? omega-3 fatty acids-fish oil 340-1,000 mg capsule Take 1 capsule by mouth.     ??? ondansetron (ZOFRAN) 4 MG tablet take 1 tablet by mouth every 8 hours if needed for nausea     ??? oxyCODONE-acetaminophen (PERCOCET) 5-325 mg per tablet Take 1 tablet by mouth daily.      ??? ROCKLATAN 0.02-0.005 % Drop INT 1 GTT INTO OU QHS     ??? sertraline (ZOLOFT) 50 MG tablet take 1 tablet by mouth once daily     ??? SITagliptin (JANUVIA) 50 MG tablet Take 1 tablet (50 mg total) by mouth daily. 90 tablet 3   ??? spironolactone (ALDACTONE) 25 MG tablet Take 1 tablet (25 mg total) by mouth daily. 90 tablet 3   ??? tamsulosin (FLOMAX) 0.4 mg capsule TAKE 1 CAPSULE(0.4 MG) BY MOUTH DAILY 90 capsule 3   ??? torsemide (DEMADEX) 20 MG tablet Take 2 tablets (40 mg total) by mouth daily. 90 tablet 3     No current facility-administered medications for this visit.       Allergies: Colchicine analogues and Tramadol    Social History:  As per MEDICAL RECORD NUMBERNo cigs or alcohol at all. No current IDU (quit many years ago). Lives with wife.     Review of Systems:  A 12 point review of systems was negative except for pertinent items noted in the HPI.    Objective:       BP 139/80 (BP Site: L Arm, BP Position: Sitting, BP Cuff Size: Medium)  - Pulse 57  - Temp 36.8 ??C (98.3 ??F) (Tympanic)  - Ht 180.3 cm (5' 11)  - Wt 100.1 kg (220 lb 9.6 oz)  - BMI 30.77 kg/m??     GEN:  looks well, no apparent distress  PSYCH:attentive, appropriate affect, good eye contact, fluent speech   THROAT: Edentulous. No lesions  LUNGS: Clear  CV: Nl s1, s2, without s3, s4, m  ABD: Nl BS, soft nontender, without masses  EXT: No edema    Recent Labs:    Lab Results   Component Value Date    RPR Nonreactive 01/29/2020    RPR Nonreactive  06/08/2018    GCNAA Negative 06/08/2018    CTNAA Negative 06/08/2018    A1C 5.8 (H) 01/29/2020    A1C 6.8 (H) 06/08/2018    A1C 5.7 11/12/2015    CHOL 88 01/29/2020    CHOL 111 09/16/2016    CHOL 148 11/12/2015    LDL 27 (L) 01/29/2020    LDL 39 (L) 09/16/2016    LDL 80 11/12/2015    HDL 23 (L) 01/29/2020    HDL 32 (L) 09/16/2016    HDL 37 (L) 11/12/2015    TRIG 188 (H) 01/29/2020    TRIG 198 (H) 09/16/2016    TRIG 155 (H) 11/12/2015    CHOLHDLRATIO 3.8 01/29/2020    CHOLHDLRATIO 3.5 09/16/2016    CHOLHDLRATIO 4.0 11/12/2015    QFTTBGOLD Negative 01/29/2020         Absolute CD4 Count   Date Value Ref Range Status   01/29/2020 649 510-2,320 /uL Final   12/26/2019 736 510-2,320 /uL Final   10/10/2019 693 510-2,320 /uL Final 09/01/2017 417 (L) 510-2,320 /uL Final   08/01/2014 514 510 - 2,320 /uL Final   01/03/2014 430 (L) 510 - 2,320 /uL Final   11/23/2012 549 510 - 2,320 /uL Final   07/20/2012 470 (L) 510 - 2,320 CELLS/UL Final     CD4% (T Helper)   Date Value Ref Range Status   01/29/2020 31 (L) 34 - 58 % Final   12/26/2019 32 (L) 34 - 58 % Final   10/10/2019 33 (L) 34 - 58 % Final   09/01/2017 27 (L) 34 - 58 % Final   08/01/2014 35 34 - 58 % Final   01/03/2014 34 34 - 58 % Final   11/23/2012 31 (L) 34 - 58 % Final   07/20/2012 29 (L) 34 - 58 % OF LYMPHS Final     HIV RNA Quant Result   Date Value Ref Range Status   01/29/2020 Not Detected Not Detected Final   12/26/2019 Detected (A) Not Detected Final   10/10/2019 Not Detected Not Detected Final   06/08/2018 Not Detected Not Detected Final   08/15/2014 Not Detected  Final   01/03/2014 Not Detected  Final   11/23/2012 Not Detected  Final   07/20/2012 Not Detected  Final     HIV RNA   Date Value Ref Range Status   12/26/2019 <40 (H) <0 copies/mL Final   05/16/2019 <40 copies/mL Final     Comment:     HIV-1 RNA not detected  The reportable range for this assay is 40 to 10,000,000  copies HIV-1 RNA/mL.     09/12/2018 <40 copies/mL Final     Comment:     HIV-1 RNA not detected  The reportable range for this assay is 40 to 10,000,000  copies HIV-1 RNA/mL.     12/01/2017 <40 (H) <0 copies/mL Final   09/01/2017 <40 (H) <0 copies/mL Final   04/02/2017 <40 (H) <0 copies/mL Final     HIV RNA Log(10)   Date Value Ref Range Status   12/26/2019   Final     Comment:     <1.6 log   05/16/2019 CANCELED log10copy/mL      Comment:     Unable to calculate result since non-numeric result obtained for  component test.    Result canceled by the ancillary.     09/12/2018 CANCELED log10copy/mL      Comment:     Unable to calculate result since non-numeric result obtained for  component  test.    Result canceled by the ancillary.     12/01/2017  <0.00 log copies/mL Final     Comment:     <1.6 log 09/01/2017  <0.00 log copies/mL Final     Comment:     <1.6 log   04/02/2017  <0.00 log copies/mL Final     Comment:     <1.6 log         Absolute CD4 Count   Date Value Ref Range Status   01/29/2020 649 510-2,320 /uL Final   12/26/2019 736 510-2,320 /uL Final   10/10/2019 693 510-2,320 /uL Final   09/01/2017 417 (L) 510-2,320 /uL Final   08/01/2014 514 510 - 2,320 /uL Final   01/03/2014 430 (L) 510 - 2,320 /uL Final   11/23/2012 549 510 - 2,320 /uL Final   07/20/2012 470 (L) 510 - 2,320 CELLS/UL Final     HIV RNA   Date Value Ref Range Status   12/26/2019 <40 (H) <0 copies/mL Final   05/16/2019 <40 copies/mL Final     Comment:     HIV-1 RNA not detected  The reportable range for this assay is 40 to 10,000,000  copies HIV-1 RNA/mL.     09/12/2018 <40 copies/mL Final     Comment:     HIV-1 RNA not detected  The reportable range for this assay is 40 to 10,000,000  copies HIV-1 RNA/mL.     12/01/2017 <40 (H) <0 copies/mL Final   09/01/2017 <40 (H) <0 copies/mL Final   04/02/2017 <40 (H) <0 copies/mL Final      Lab Results   Component Value Date    WBC 9.5 01/29/2020    WBC 9.8 05/16/2019    HGB 15.0 01/29/2020    HGB 14.3 05/16/2019    Platelet 140 (L) 01/29/2020    Platelet 149 (L) 05/16/2019    Creatinine 3.55 (H) 01/29/2020    Creatinine 2.30 (H) 07/31/2019    AST 51 (H) 01/29/2020    AST 48 (H) 01/29/2020    AST 31 05/16/2019    ALT 96 (H) 01/29/2020    ALT 95 (H) 01/29/2020    ALT 37 05/16/2019    Triglycerides 188 (H) 01/29/2020    Triglycerides 80 01/03/2014    HDL 23 (L) 01/29/2020    HDL 53 01/03/2014    LDL Calculated 27 (L) 01/29/2020    LDL Cholesterol, Calculated 71 01/03/2014       Immunization History   Administered Date(s) Administered   ??? Hepatitis B, Adult 04/06/2016, 05/21/2016, 08/24/2016   ??? INFLUENZA INJ MDCK PF, Quad (Flucelvax)(2y and up Egg Free) 02/28/2019   ??? INFLUENZA TIV (TRI) PF (IM) 02/08/2008, 03/26/2010, 03/25/2011   ??? Influenza Vaccine Quad (IIV4 PF) 1mo+ injectable 07/24/2015, 02/05/2016, 03/08/2018   ??? Influenza Virus Vaccine, unspecified formulation 06/15/2014, 07/24/2015, 02/05/2016, 03/15/2016, 03/31/2017   ??? PNEUMOCOCCAL POLYSACCHARIDE 23 07/13/2000, 08/19/2005, 12/01/2017   ??? PPD Test 12/29/2006, 07/11/2008, 03/26/2010, 03/25/2011   ??? Pneumococcal Conjugate 13-Valent 04/13/2012   ??? SHINGRIX-ZOSTER VACCINE (HZV), RECOMBINANT,SUB-UNIT,ADJUVANTED IM 09/16/2016, 12/01/2017   ??? TdaP 08/31/2007, 10/27/2018   ??? Tuberculin Skin Test;unspecified Formulation 12/29/2006, 07/11/2008, 03/26/2010, 03/25/2011

## 2020-01-31 ENCOUNTER — Ambulatory Visit
Admit: 2020-01-31 | Discharge: 2020-02-01 | Payer: MEDICARE | Attending: Infectious Disease | Primary: Infectious Disease

## 2020-01-31 DIAGNOSIS — N186 End stage renal disease: Principal | ICD-10-CM

## 2020-01-31 DIAGNOSIS — B20 Human immunodeficiency virus [HIV] disease: Principal | ICD-10-CM

## 2020-01-31 LAB — TOXOPLASMA GONDII IGG: Toxoplasma gondii Ab.IgG:PrThr:Pt:Ser:Ord:: NEGATIVE

## 2020-01-31 LAB — HERPES SIMPLEX VIRUS 2 IGG: Lab: POSITIVE — AB

## 2020-01-31 LAB — EPSTEIN-BARR VCA IGG ANTIBODY: Epstein Barr virus capsid Ab.IgG:PrThr:Pt:Ser:Ord:: POSITIVE — AB

## 2020-01-31 LAB — HSV ANTIBODIES, IGG: HERPES SIMPLEX VIRUS 1 IGG: POSITIVE — AB

## 2020-01-31 LAB — HCV RNA(IU): Hepatitis C virus RNA:ACnc:Pt:Ser/Plas:Qn:Probe.amp.tar: 0

## 2020-01-31 LAB — CMV IGG: Cytomegalovirus Ab.IgG:Imp:Pt:Ser/Plas:Nom:: POSITIVE — AB

## 2020-01-31 LAB — HEPATITIS C RNA, QUANTITATIVE, PCR: HCV RNA: NOT DETECTED

## 2020-01-31 LAB — RUBELLA IGG SCREEN: Rubella virus Ab.IgG:PrThr:Pt:Ser:Ord:: POSITIVE

## 2020-01-31 LAB — HLA C2 AB SCR: Lab: NEGATIVE

## 2020-01-31 LAB — HLA C1 AB SCR: Lab: NEGATIVE

## 2020-01-31 LAB — VARICELLA ZOSTER IGG: Varicella zoster virus Ab.IgG:PrThr:Pt:Ser:Ord:: POSITIVE

## 2020-02-01 LAB — HEPATITIS B CORE TOTAL ANTIBODY: Hepatitis B virus core Ab:PrThr:Pt:Ser/Plas:Ord:IA: REACTIVE — AB

## 2020-02-01 LAB — PROTEIN C ACTIVITY: Lab: 111

## 2020-02-01 LAB — HEPATITIS B SURFACE ANTIGEN: Hepatitis B virus surface Ag:PrThr:Pt:Ser:Ord:: NONREACTIVE

## 2020-02-01 LAB — LUPUS INHIBITOR PANEL
DILUTE RUSSELL VIPER VENOM TIME: 50 s — ABNORMAL HIGH (ref 0.0–48.6)
DRVVT NORMALIZED RATIO: 1.02 (ref 0.00–1.20)

## 2020-02-01 LAB — PROTEIN S ANTIGEN FREE: Lab: 135

## 2020-02-02 LAB — LUPUS INHIBITOR PANEL
DILUTE RUSSELL VIPER VENOM TIME CONF: 43.5 s
DILUTE RUSSELL VIPER VENOM TIME: 50 s — ABNORMAL HIGH (ref 0.0–48.6)
DRVVT NORMALIZED RATIO: 1.02 (ref 0.00–1.20)
PTT LUPUS ANTICOAGULANT: 38.8 s (ref 0.0–47.8)

## 2020-02-02 LAB — DILUTE RUSSELL VIPER VENOM TIME CONF: Lab: 43.5

## 2020-02-02 NOTE — Unmapped (Signed)
Patient called to review his medications. He said that he received the letter that was sent to him about elevated zinc and Vit A.  He said he eats a lot of salads and drinks lemonade.   As far as supplements, he provided the following list:  Ginger root  B 12  Bone Strong - vit D, Ca, Mg  B Complex  Vit C  Mega 3  K 2  Iron  D 3  Potassium  Dietary fiber supplement with mutlivitamins    Instructed the patient to stop the multivitamin and recheck labs in a couple of months. If his lab values return to normal, we will know that caused it.   Patient verbalized understanding.  Phone x 25 minutes.

## 2020-02-09 LAB — HLA CL I&II, LOW RES
BW #1: 4
BW #2: 6
LOW RES DRW #1: 51
LOW RES HLA A #1: 3
LOW RES HLA A #2: 23
LOW RES HLA B #1: 7
LOW RES HLA B #2: 57
LOW RES HLA C #2: 15
LOW RES HLA DQ#2: 6
LOW RES HLA DR#1: 15
LOW RES HLA DR#2: 15

## 2020-02-09 LAB — LOW RES DRW #1: Lab: 51

## 2020-02-28 NOTE — Unmapped (Signed)
[facility] Refill Coordination Note    Specialty Medication(s) to be Shipped:   Infectious Disease: Descovy and Tivicay    Other medication(s) to be shipped: n/a     Samuel [name], DOB: [birth year]  Phone: [phone] (home)       All above HIPAA information was verified with patient.     Was a Nurse, learning disability used for this call? No    Completed refill call assessment today to schedule patient's medication shipment from the [facility] [phone]).       Specialty medication(s) and dose(s) confirmed: Regimen is correct and unchanged.   Changes to medications: [name] reports no changes at this time.  Changes to insurance: No  Questions for the pharmacist: No    Confirmed patient received Welcome Packet with first shipment. The patient will receive a drug information handout for each medication shipped and additional FDA Medication Guides as required.       DISEASE/MEDICATION-SPECIFIC INFORMATION        N/A    SPECIALTY MEDICATION ADHERENCE     Medication Adherence    Patient reported X missed doses in the last month: 0  Specialty Medication: tivicay  Patient is on additional specialty medications: Yes  Additional Specialty Medications: descovy  Patient Reported Additional Medication X Missed Doses in the Last Month: 0          Unable to confirm quantity on hand      SHIPPING     Shipping address confirmed in Epic.     Delivery Scheduled: Yes, Expected medication delivery date: 10/4.     Medication will be delivered via Same Day Courier to the prescription address in Epic WAM.    [name]   [facility] Specialty Technician

## 2020-03-01 NOTE — Unmapped (Signed)
Patient and patient's wife called and LM for me.  They stated that they went to PCP and the PCP is concerned about kidney number and they need to get in touch with kidney MD.  Called wife to clarify if they had transplant questions or CKD questions.  She stated they were just trying to speak with Dr. Stefano Gaul about his kidney numbers and they were given my number as his nurse.  I explained to Mrs. Tunison, that it sounds like they need to speak with Dr. Stefano Gaul and that I would send a message to him on their behalf.  This message routed to Dr. Stefano Gaul.

## 2020-03-04 MED FILL — TIVICAY 50 MG TABLET: 30 days supply | Qty: 30 | Fill #3 | Status: AC

## 2020-03-04 MED FILL — TIVICAY 50 MG TABLET: ORAL | 30 days supply | Qty: 30 | Fill #3

## 2020-03-04 MED FILL — DESCOVY 200 MG-25 MG TABLET: ORAL | 30 days supply | Qty: 30 | Fill #3

## 2020-03-04 MED FILL — DESCOVY 200 MG-25 MG TABLET: 30 days supply | Qty: 30 | Fill #3 | Status: AC

## 2020-03-04 NOTE — Unmapped (Signed)
HISTORY OF PRESENT ILLNESS:      [name] is a 69 year old man with stage G4:A2 chronic kidney disease who is evaluated today for stage G4:A2 chronic kidney disease.  Made a conscious decision to decrease his intake of sodium which helped ameliorate his leg edema.  His serum creatinine, however, rose to 5.1 mg/dL.  He held his metolazone and since that time the serum creatinine was found to 4.2 mg/dL.  His eGFR is now 17 mL/min.  He continues to have difficulty with walking secondary to numbness in his feet.  We talked about trying pool [facility] and [facility]  He does not have dyspnea, but has occasional fleeting chest discomfort.  He completed his orientation class for transplant and I have placed a referral for a CT to define transplant targets as well as a PET myocardial perfusion study. He continues to have episodes of grunting during sleep.    MEDICATIONS:      Medication Sig   ??? albuterol (PROVENTIL HFA;VENTOLIN HFA) 90 mcg/actuation inhaler Inhale 2 puffs.   ??? allopurinoL (ZYLOPRIM) 100 MG tablet Take 250 mg by mouth.   ??? amLODIPine (NORVASC) 2.5 MG tablet Take 3 tablets (7.5 mg total) by mouth every morning.   ??? aspirin (ECOTRIN) 81 MG tablet    ??? atenoloL (TENORMIN) 100 MG tablet Take 1 tablet (100 mg total) by mouth daily.   ??? atorvastatin (LIPITOR) 20 MG tablet TAKE 1 TABLET (20 MG) BY MOUTH DAILY   ??? calcium carbonate-vitamin D2 500 mg(1,250mg ) -200 unit tablet Take 1 tablet by mouth Two (2) times a day.   ??? cloNIDine HCL (CATAPRES) 0.1 MG tablet Take 1 tablet (0.1 mg total) by mouth 3 (three) times a day.   ??? cranberry 400 mg cap Take 400 mg by mouth daily.   ??? cyanocobalamin 1000 MCG tablet Take 1,000 mcg by mouth.   ??? cysteamine bitartrate (PROCYSBI) 300 mg grdp 1 each.   ??? diazepam (VALIUM) 5 MG tablet Take 5 mg by mouth nightly.    ??? dicyclomine (BENTYL) 10 mg capsule Take 10 mg by mouth two (2) times a day as needed.    ??? docusate sodium (COLACE) 100 MG capsule Take 1 capsule (100 mg total) by mouth every twelve (12) hours.   ??? dolutegravir (TIVICAY) 50 mg Tab TABLET Take 1 tablet by mouth daily   ??? donepeziL (ARICEPT) 10 MG tablet Take 10 mg by mouth.   ??? dorzolamide-timoloL (COSOPT) 22.3-6.8 mg/mL ophthalmic solution INSTRILL 1 DROP INTO BOTH EYES EACH MORNING   ??? doxazosin (CARDURA) 4 MG tablet Take 1 tablet (4 mg total) by mouth nightly.   ??? emtricitabine-tenofovir alafen (DESCOVY) 200-25 mg tablet Take 1 tablet by mouth daily   ??? ferrous sulfate 325 (65 FE) MG tablet Take 325 mg by mouth daily with breakfast.    ??? folic acid (FOLVITE) 1 MG tablet    ??? gabapentin (NEURONTIN) 300 MG capsule TAKE 1 CAPSULE BY MOUTH EVERY NIGHT AT BEDTIME FOR 4 DAYS THEN 1 CAPSULE TWICE DAILY   ??? glimepiride (AMARYL) 4 MG tablet Take 1 tablet (4 mg total) by mouth daily.   ??? hydrALAZINE (APRESOLINE) 100 MG tablet Take 1 tablet (100 mg total) by mouth Two (2) times a day.   ??? HYDROcodone-acetaminophen (NORCO) 5-325 mg per tablet Directions are take one tablet 30 minutes before physical therapy, then take one tablet at bedtime as needed for pain   ??? lisinopriL (PRINIVIL,ZESTRIL) 40 MG  tablet Take 1 tablet (40 mg total) by mouth daily.   ??? meclizine (ANTIVERT) 25 mg tablet Take 25 mg by mouth Three (3) times a day as needed.    ??? meTOLAzone (ZAROXOLYN) 10 MG tablet Take 1 tablet (10 mg total) by mouth daily.   ??? nitroglycerin (NITROSTAT) 0.4 MG SL tablet DISSOLVE ONE TABLET UNDER TONGUE AS NEEDED FOR CHEST PAIN EVERY 5 MINUTES AS DIRECTED   ??? nystatin (MYCOSTATIN) 100,000 unit/mL suspension Take 5 mL by mouth daily.    ??? omega-3 fatty acids-fish oil 340-1,000 mg capsule Take 1 capsule by mouth.   ??? ondansetron (ZOFRAN) 4 MG tablet take 1 tablet by mouth every 8 hours if needed for nausea   ??? oxyCODONE-acetaminophen (PERCOCET) 5-325 mg per tablet Take 1 tablet by mouth.   ??? ROCKLATAN 0.02-0.005 % Drop INT 1 GTT INTO OU QHS   ??? sertraline (ZOLOFT) 50 MG tablet take 1 tablet by mouth once daily   ??? SITagliptin (JANUVIA) 50 MG tablet Take 1 tablet (50 mg total) by mouth daily.   ??? spironolactone (ALDACTONE) 25 MG tablet Take 1 tablet (25 mg total) by mouth daily.   ??? tamsulosin (FLOMAX) 0.4 mg capsule TAKE 1 CAPSULE(0.4 MG) BY MOUTH DAILY   ??? torsemide (DEMADEX) 20 MG tablet Take 2 tablets (40 mg total) by mouth daily.     REVIEW OF SYSTEMS:  Constitutional: No fevers or chills. Cardiovascular: No chest pain.  Pulmonary: He had a negative sleep study in 2019.  However, he reports grunting and episodes of apnea.  His cardiologist, Dr. Lona Millard, is arranging for a home sleep study.  All other systems are reviewed and are negative except that noted in the history of present illness.    PHYSICAL EXAMINATION:      Constitutional: He is in no acute distress.  Vital signs:  Temp 36.8 ??C (98.2 ??F) (Temporal)  - Wt 97.4 kg (214 lb 12.8 oz)  - BMI 29.96 kg/m?? -  BP left arm 123/76  Wt Readings from Last 3 Encounters:   03/05/20 97.4 kg (214 lb 12.8 oz)   01/31/20 100.1 kg (220 lb 9.6 oz)   01/29/20 100.4 kg (221 lb 6.4 oz)   HEENT: Masked.    Neck: Supple without lymphadenopathy or thyromegaly. No carotid bruits.   Lungs: Clear to auscultation   Heart: Regular rate and rhythm. Normal S1-S2.   Abdomen: Soft, nontender, normoactive bowel sounds, without organomegaly or mass. No abdominal bruits.     Neurologic: Alert and oriented x 3.  Strength is normal.  DTRs are 2+ and symmetric.  There are no lateralizing sensory deficits.  Extremities: Trace pedal edema. Dorsalis pedis and posterior tibial pulses are palpable.    Node survey: No lymphadenopathy.   Musculoskeletal: No active synovitis.   Skin/Nails:  No rash or nail changes.     LABORATORY STUDIES:    POCT Urine Microscopic Exam personally reviewed by me shows:    Collection Time: 03/05/20  2:47 PM   Result Value Ref Range    Color, UA Light Yellow     Clarity, UA Clear     WBC UA, POC 0-3/hpf     RBC UA, POC <2/hpf     Renal tubular epithelial cells, POC Occasional Casts, POC Rare hyaline casts     Crystals, POC None     Bacteria, UA None              IMPRESSION AND PLAN:    1.  Stage G4:A2  chronic kidney disease. I agree with above Hande at the recent acute on chronic kidney disease likely relates to diuretic therapy.  Therefore, he will hold metolazone and will have follow-up labs again in 1 week.  A degree of permissive edema is in order to avoid inordinate azotemia.  We will continue his transplant evaluation.  I do not recommend proceeding with PD catheter placement yet.  However, he merits close follow-up.  ??  2.  Resistant hypertension.  His blood pressure is well controlled to the point that we can now hold metolazone. He will continue torsemide 40 mg once daily, spironolactone 25 mg once daily, amlodipine 7.5 mg once daily, atenolol 100 mg once daily, doxazosin 4 mg daily, lisinopril 40 mg once daily, and hydralazine 100 mg twice daily.  Repeat labs will be obtained next week.  If the serum creatinine remains elevated, spironolactone will be discontinued.    3.  Human immunodeficiency virus disease.  He is doing well on HAART therapy.     4.  Lower urinary tract symptoms.    Continue tamsulosin.    5.  Borderline hypercalcemia. This is most likely related to secondary and perhaps early tertiary hyperparathyroidism secondary to chronic kidney disease.   ??  6.  Nephrolithiasis.  No further intervention is required at this time.  ??  7.  Irritable bowel syndrome.  Continue on dicyclomine.    8.  Suspected sleep apnea. He is scheduled for a home sleep study 03/21/20.    ??  9.  Follow up plans.  He will return to see me in about 3 weeks.  Repeat labs are planned for next week.    I personally spent 50 minutes face-to-face and non-face-to-face in the care of this patient, which includes all pre, intra, and post visit time on the date of service.    Zetta Bills. Stefano Gaul, MD  Date of service 03/05/2020

## 2020-03-05 ENCOUNTER — Ambulatory Visit: Admit: 2020-03-05 | Discharge: 2020-03-06 | Payer: MEDICARE | Attending: Nephrology | Primary: Nephrology

## 2020-03-05 DIAGNOSIS — I2510 Atherosclerotic heart disease of native coronary artery without angina pectoris: Principal | ICD-10-CM

## 2020-03-05 DIAGNOSIS — B182 Chronic viral hepatitis C: Principal | ICD-10-CM

## 2020-03-05 DIAGNOSIS — Z01818 Encounter for other preprocedural examination: Principal | ICD-10-CM

## 2020-03-05 DIAGNOSIS — S32511B Fracture of superior rim of right pubis, initial encounter for open fracture: Principal | ICD-10-CM

## 2020-03-05 DIAGNOSIS — I999 Unspecified disorder of circulatory system: Principal | ICD-10-CM

## 2020-03-05 DIAGNOSIS — N184 Chronic kidney disease, stage 4 (severe): Principal | ICD-10-CM

## 2020-03-05 DIAGNOSIS — B20 Human immunodeficiency virus [HIV] disease: Principal | ICD-10-CM

## 2020-03-05 DIAGNOSIS — K7469 Other cirrhosis of liver: Principal | ICD-10-CM

## 2020-03-05 DIAGNOSIS — G4733 Obstructive sleep apnea (adult) (pediatric): Principal | ICD-10-CM

## 2020-03-05 DIAGNOSIS — I5033 Acute on chronic diastolic (congestive) heart failure: Principal | ICD-10-CM

## 2020-03-05 LAB — CREATININE, URINE: Lab: 92.3

## 2020-03-05 LAB — ALBUMIN / CREATININE URINE RATIO: ALBUMIN QUANT URINE: 0.5 mg/dL

## 2020-03-05 LAB — PROTEIN / CREATININE RATIO, URINE: PROTEIN URINE: 6.9 mg/dL

## 2020-03-05 LAB — PROTEIN URINE: Protein:MCnc:Pt:Urine:Qn:: 6.9

## 2020-03-05 NOTE — Unmapped (Signed)
AOBP:  Left arm   Large cuff     Average: 123/76 Pulse: 75        1st reading:  121/74  Pulse: 75  2nd reading: 122/77            Pulse: 74  3rd reading:  126/76             Pulse: 75

## 2020-03-05 NOTE — Unmapped (Addendum)
1. The patient indicates understanding of these issues and agrees with the plan. With Dr. Marcello Fennel with holding metolazone.    2. Labs again next week and I will call you once I see the results-call the clinic at [phone] and let them know that you had the labs.    3. Return visit on 03/26/20

## 2020-03-08 NOTE — Unmapped (Signed)
ERROR

## 2020-03-11 DIAGNOSIS — N184 Chronic kidney disease, stage 4 (severe): Principal | ICD-10-CM

## 2020-03-14 ENCOUNTER — Ambulatory Visit: Admit: 2020-03-14 | Discharge: 2020-03-15 | Payer: MEDICARE

## 2020-03-14 MED ADMIN — Rb-82 Rubidium Chloride: 40 | INTRAVENOUS | @ 20:00:00 | Stop: 2020-03-14

## 2020-03-14 MED ADMIN — regadenoson (LEXISCAN) injection: .4 mg | INTRAVENOUS | @ 19:00:00 | Stop: 2020-03-14

## 2020-03-14 MED ADMIN — Rb-82 Rubidium Chloride: 40 | INTRAVENOUS | @ 19:00:00 | Stop: 2020-03-14

## 2020-03-19 NOTE — Unmapped (Signed)
PHONE VISIT 03/26/2020    Location of patient: Home in [facility]    Location of provider: [facility], [address] [place], [place] [address]    HISTORY OF PRESENT ILLNESS:      [name] is a 69 year old man with stage G4:A2 chronic kidney disease who is evaluated today for stage G4:A2 chronic kidney disease.  Since stopping metolazone, he indicates that his blood pressures been well controlled averaging about 120/80 mmHg.  He has not had increasing leg edema.  He has not had chest pain, PND, orthopnea.  Recent sleep study showed evidence of mild sleep apnea.  He has daytime hypersomnolence and his spouse has noted that he grunts while sleeping.  He does not have fever, abdominal pain, chest pain, PND, or orthopnea.    MEDICATIONS:      Medication Sig   ??? albuterol (PROVENTIL HFA;VENTOLIN HFA) 90 mcg/actuation inhaler Inhale 2 puffs.   ??? allopurinoL (ZYLOPRIM) 100 MG tablet Take 250 mg by mouth.   ??? amLODIPine (NORVASC) 2.5 MG tablet Take 3 tablets (7.5 mg total) by mouth every morning.   ??? aspirin (ECOTRIN) 81 MG tablet    ??? atenoloL (TENORMIN) 100 MG tablet Take 1 tablet (100 mg total) by mouth daily.   ??? atorvastatin (LIPITOR) 20 MG tablet TAKE 1 TABLET (20 MG) BY MOUTH DAILY   ??? calcium carbonate-vitamin D2 500 mg(1,250mg ) -200 unit tablet Take 1 tablet by mouth Two (2) times a day.   ??? cloNIDine HCL (CATAPRES) 0.1 MG tablet Take 1 tablet (0.1 mg total) by mouth 3 (three) times a day.   ??? cranberry 400 mg cap Take 400 mg by mouth daily.   ??? cyanocobalamin 1000 MCG tablet Take 1,000 mcg by mouth.   ??? cysteamine bitartrate (PROCYSBI) 300 mg grdp 1 each.   ??? diazepam (VALIUM) 5 MG tablet Take 5 mg by mouth nightly.    ??? dicyclomine (BENTYL) 10 mg capsule Take 10 mg by mouth two (2) times a day as needed.    ??? docusate sodium (COLACE) 100 MG capsule Take 1 capsule (100 mg total) by mouth every twelve (12) hours.   ??? dolutegravir (TIVICAY) 50 mg Tab TABLET Take 1 tablet by mouth daily   ??? donepeziL (ARICEPT) 10 MG tablet Take 10 mg by mouth.   ??? dorzolamide-timoloL (COSOPT) 22.3-6.8 mg/mL ophthalmic solution INSTRILL 1 DROP INTO BOTH EYES EACH MORNING   ??? doxazosin (CARDURA) 4 MG tablet Take 1 tablet (4 mg total) by mouth nightly.   ??? emtricitabine-tenofovir alafen (DESCOVY) 200-25 mg tablet Take 1 tablet by mouth daily   ??? ferrous sulfate 325 (65 FE) MG tablet Take 325 mg by mouth daily with breakfast.    ??? folic acid (FOLVITE) 1 MG tablet    ??? gabapentin (NEURONTIN) 300 MG capsule TAKE 1 CAPSULE BY MOUTH EVERY NIGHT AT BEDTIME FOR 4 DAYS THEN 1 CAPSULE TWICE DAILY   ??? glimepiride (AMARYL) 4 MG tablet Take 1 tablet (4 mg total) by mouth daily.   ??? hydrALAZINE (APRESOLINE) 100 MG tablet Take 1 tablet (100 mg total) by mouth Two (2) times a day.   ??? HYDROcodone-acetaminophen (NORCO) 5-325 mg per tablet Directions are take one tablet 30 minutes before physical therapy, then take one tablet at bedtime as needed for pain   ??? lisinopriL (PRINIVIL,ZESTRIL) 40 MG tablet Take 1 tablet (40 mg total) by mouth daily.   ??? meclizine (ANTIVERT) 25 mg tablet Take 25 mg by mouth  Three (3) times a day as needed.    ??? meTOLAzone (ZAROXOLYN) 10 MG tablet Take 1 tablet (10 mg total) by mouth daily.   ??? nitroglycerin (NITROSTAT) 0.4 MG SL tablet DISSOLVE ONE TABLET UNDER TONGUE AS NEEDED FOR CHEST PAIN EVERY 5 MINUTES AS DIRECTED   ??? nystatin (MYCOSTATIN) 100,000 unit/mL suspension Take 5 mL by mouth daily.    ??? omega-3 fatty acids-fish oil 340-1,000 mg capsule Take 1 capsule by mouth.   ??? ondansetron (ZOFRAN) 4 MG tablet take 1 tablet by mouth every 8 hours if needed for nausea   ??? oxyCODONE-acetaminophen (PERCOCET) 5-325 mg per tablet Take 1 tablet by mouth.   ??? ROCKLATAN 0.02-0.005 % Drop INT 1 GTT INTO OU QHS   ??? sertraline (ZOLOFT) 50 MG tablet take 1 tablet by mouth once daily   ??? SITagliptin (JANUVIA) 50 MG tablet Take 1 tablet (50 mg total) by mouth daily.   ??? spironolactone (ALDACTONE) 25 MG tablet Take 1 tablet (25 mg total) by mouth daily.   ??? tamsulosin (FLOMAX) 0.4 mg capsule TAKE 1 CAPSULE(0.4 MG) BY MOUTH DAILY   ??? torsemide (DEMADEX) 20 MG tablet Take 2 tablets (40 mg total) by mouth daily.     REVIEW OF SYSTEMS:  Constitutional: No fevers or chills. Cardiovascular: No chest pain.  Pulmonary:  All other systems are reviewed and are negative except that noted in the history of present illness.    PHYSICAL EXAMINATION:      A physical examination could not be performed because this was a phone encounter.    LABORATORY STUDIES:    Pending.    IMPRESSION AND PLAN:    1.  Stage G4:A2 chronic kidney disease.  Clinically he is stable after stopping metolazone.  He will have repeat laboratory studies this week.  A CT scan is scheduled to evaluate the pelvic vessels to ascertain if they are suitable for transplantation.  He will continue to have monthly laboratory studies.  I will plan to see him in clinic again in early January 2022.  ??  2.  Hypertension.  His blood pressure is adequately controlled on torsemide 40 mg once daily, spironolactone 25 mg once daily, amlodipine 7.5 mg once daily, atenolol 100 mg once daily, doxazosin 4 mg daily, lisinopril 40 mg once daily, and hydralazine 100 mg twice daily.      3.  Human immunodeficiency virus disease.  He is doing well on HAART therapy.     4.  Lower urinary tract symptoms.    Continue tamsulosin.    5.    Secondary/tertiary hyperparathyroidism.  A follow-up serum calcium level will be obtained this week.  He has evidence of secondary in early tertiary hyperparathyroidism.  ??  6.  Nephrolithiasis.  No further intervention is required at this time.  ??  7.  Irritable bowel syndrome.  Continue on dicyclomine.    8.  Mild sleep apnea.  He was counseled regarding the use of nasal corticosteroids and trying to minimize supine position sleep.  He will be referred for a CPAP titration study.  ??  9.  Follow up plans.  He will return to see me in about 3 months.  Repeat labs are planned for next week.        I spent 26 minutes on the phone with the patient on the date of service. I spent an additional 20 minutes on pre- and post-visit activities on the date of service.     The patient was physically located  in [place] or a state in which I am permitted to provide care. The patient and/or parent/guardian understood that s/he may incur co-pays and cost sharing, and agreed to the telemedicine visit. The visit was reasonable and appropriate under the circumstances given the patient's presentation at the time.    The patient and/or parent/guardian has been advised of the potential risks and limitations of this mode of treatment (including, but not limited to, the absence of in-person examination) and has agreed to be treated using telemedicine. The patient's/patient's family's questions regarding telemedicine have been answered.     If the visit was completed in an ambulatory setting, the patient and/or parent/guardian has also been advised to contact their provider???s office for worsening conditions, and seek emergency medical treatment and/or call 911 if the patient deems either necessary.    Zetta Bills. Stefano Gaul, MD  Date of service 03/26/2020

## 2020-03-21 ENCOUNTER — Ambulatory Visit: Admit: 2020-03-21 | Discharge: 2020-03-23 | Payer: MEDICARE

## 2020-03-25 DIAGNOSIS — N184 Chronic kidney disease, stage 4 (severe): Principal | ICD-10-CM

## 2020-03-26 ENCOUNTER — Telehealth: Admit: 2020-03-26 | Discharge: 2020-03-27 | Payer: MEDICARE | Attending: Nephrology | Primary: Nephrology

## 2020-03-26 NOTE — Unmapped (Signed)
1.  Continue current medications    2.  Labs this week    3.  CT scanning 03/28/20; arrive at 7:15 PM at [facility].    4.  Return in January 2022.

## 2020-03-27 LAB — RENAL FUNCTION PANEL
ALBUMIN: 4.7 g/dL (ref 3.8–4.8)
BLOOD UREA NITROGEN: 29 mg/dL — ABNORMAL HIGH (ref 8–27)
BUN / CREAT RATIO: 11 (ref 10–24)
CALCIUM: 10.4 mg/dL — ABNORMAL HIGH (ref 8.6–10.2)
CHLORIDE: 104 mmol/L (ref 96–106)
CREATININE: 2.56 mg/dL — ABNORMAL HIGH (ref 0.76–1.27)
GFR MDRD NON AF AMER: 25 mL/min/{1.73_m2} — ABNORMAL LOW
POTASSIUM: 4.8 mmol/L (ref 3.5–5.2)
SODIUM: 143 mmol/L (ref 134–144)

## 2020-03-27 LAB — ALBUMIN: Albumin:MCnc:Pt:Ser/Plas:Qn:: 4.7

## 2020-03-28 ENCOUNTER — Ambulatory Visit: Admit: 2020-03-28 | Discharge: 2020-03-29 | Payer: MEDICARE

## 2020-03-29 DIAGNOSIS — S72091D Other fracture of head and neck of right femur, subsequent encounter for closed fracture with routine healing: Principal | ICD-10-CM

## 2020-04-03 NOTE — Unmapped (Signed)
[facility] Refill Coordination Note    Specialty Medication(s) to be Shipped:   Infectious Disease: Descovy and Tivicay    Other medication(s) to be shipped: No additional medications requested for fill at this time     Samuel [name], DOB: 1951-02-07  Phone: [phone] (home)       All above HIPAA information was verified with patient.     Was a Nurse, learning disability used for this call? No    Completed refill call assessment today to schedule patient's medication shipment from the [facility] [phone]).       Specialty medication(s) and dose(s) confirmed: Regimen is correct and unchanged.   Changes to medications: Samuel [name] reports no changes at this time.  Changes to insurance: No  Questions for the pharmacist: No    Confirmed patient received Welcome Packet with first shipment. The patient will receive a drug information handout for each medication shipped and additional FDA Medication Guides as required.       DISEASE/MEDICATION-SPECIFIC INFORMATION        N/A    SPECIALTY MEDICATION ADHERENCE     Medication Adherence    Patient reported X missed doses in the last month: 0  Specialty Medication: descovy 200-25mg   Patient is on additional specialty medications: Yes  Additional Specialty Medications: tivicay 50mg   Patient Reported Additional Medication X Missed Doses in the Last Month: 0                tivicay  : 3 days of medicine on hand   descovy  : 3 days of medicine on hand         SHIPPING     Shipping address confirmed in Epic.     Delivery Scheduled: Yes, Expected medication delivery date: 11/5.     Medication will be delivered via Next Day Courier to the prescription address in Epic WAM.    [name]   [facility] Specialty Technician

## 2020-04-04 MED FILL — TIVICAY 50 MG TABLET: ORAL | 30 days supply | Qty: 30 | Fill #4

## 2020-04-04 MED FILL — TIVICAY 50 MG TABLET: 30 days supply | Qty: 30 | Fill #4 | Status: AC

## 2020-04-04 MED FILL — DESCOVY 200 MG-25 MG TABLET: 30 days supply | Qty: 30 | Fill #4 | Status: AC

## 2020-04-04 MED FILL — DESCOVY 200 MG-25 MG TABLET: ORAL | 30 days supply | Qty: 30 | Fill #4

## 2020-04-07 ENCOUNTER — Emergency Department: Payer: Medicare Other

## 2020-04-07 ENCOUNTER — Other Ambulatory Visit: Payer: Self-pay

## 2020-04-07 ENCOUNTER — Emergency Department
Admission: EM | Admit: 2020-04-07 | Discharge: 2020-04-07 | Disposition: A | Discharge status: Home / Self Care | Payer: Medicare Other | Attending: Emergency Medicine | Admitting: Emergency Medicine

## 2020-04-07 ENCOUNTER — Encounter: Payer: Self-pay | Admitting: Emergency Medicine

## 2020-04-07 DIAGNOSIS — N132 Hydronephrosis with renal and ureteral calculous obstruction: Secondary | ICD-10-CM | POA: Diagnosis not present

## 2020-04-07 DIAGNOSIS — Z7984 Long term (current) use of oral hypoglycemic drugs: Secondary | ICD-10-CM | POA: Insufficient documentation

## 2020-04-07 DIAGNOSIS — N183 Chronic kidney disease, stage 3 unspecified: Secondary | ICD-10-CM | POA: Insufficient documentation

## 2020-04-07 DIAGNOSIS — I5023 Acute on chronic systolic (congestive) heart failure: Secondary | ICD-10-CM | POA: Diagnosis not present

## 2020-04-07 DIAGNOSIS — E1122 Type 2 diabetes mellitus with diabetic chronic kidney disease: Secondary | ICD-10-CM | POA: Insufficient documentation

## 2020-04-07 DIAGNOSIS — N201 Calculus of ureter: Secondary | ICD-10-CM

## 2020-04-07 DIAGNOSIS — I2510 Atherosclerotic heart disease of native coronary artery without angina pectoris: Secondary | ICD-10-CM | POA: Insufficient documentation

## 2020-04-07 DIAGNOSIS — R109 Unspecified abdominal pain: Secondary | ICD-10-CM | POA: Diagnosis present

## 2020-04-07 DIAGNOSIS — I130 Hypertensive heart and chronic kidney disease with heart failure and stage 1 through stage 4 chronic kidney disease, or unspecified chronic kidney disease: Secondary | ICD-10-CM | POA: Insufficient documentation

## 2020-04-07 DIAGNOSIS — Z87891 Personal history of nicotine dependence: Secondary | ICD-10-CM | POA: Insufficient documentation

## 2020-04-07 DIAGNOSIS — Z79899 Other long term (current) drug therapy: Secondary | ICD-10-CM | POA: Insufficient documentation

## 2020-04-07 DIAGNOSIS — Z21 Asymptomatic human immunodeficiency virus [HIV] infection status: Secondary | ICD-10-CM | POA: Diagnosis not present

## 2020-04-07 LAB — URINALYSIS, COMPLETE (UACMP) WITH MICROSCOPIC
Bilirubin Urine: NEGATIVE
Glucose, UA: NEGATIVE mg/dL
Ketones, ur: NEGATIVE mg/dL
Leukocytes,Ua: NEGATIVE
Nitrite: NEGATIVE
Protein, ur: 30 mg/dL — AB
RBC / HPF: >50 RBC/hpf — ABNORMAL HIGH (ref 0–5)
Specific Gravity, Urine: 1.012 (ref 1.005–1.030)
pH: 5 (ref 5.0–8.0)

## 2020-04-07 LAB — CBC
HCT: 44.1 % (ref 39.0–52.0)
Hemoglobin: 14.9 g/dL (ref 13.0–17.0)
MCH: 31.6 pg (ref 26.0–34.0)
MCHC: 33.8 g/dL (ref 30.0–36.0)
MCV: 93.4 fL (ref 80.0–100.0)
Platelets: 159 10*3/uL (ref 150–400)
RBC: 4.72 MIL/uL (ref 4.22–5.81)
RDW: 12.6 % (ref 11.5–15.5)
WBC: 11 10*3/uL — ABNORMAL HIGH (ref 4.0–10.5)
nRBC: 0 % (ref 0.0–0.2)

## 2020-04-07 LAB — COMPREHENSIVE METABOLIC PANEL
ALT: 59 U/L — ABNORMAL HIGH (ref 0–44)
AST: 40 U/L (ref 15–41)
Albumin: 4.4 g/dL (ref 3.5–5.0)
Alkaline Phosphatase: 67 U/L (ref 38–126)
Anion gap: 13 (ref 5–15)
BUN: 24 mg/dL — ABNORMAL HIGH (ref 8–23)
CO2: 25 mmol/L (ref 22–32)
Calcium: 10.4 mg/dL — ABNORMAL HIGH (ref 8.9–10.3)
Chloride: 103 mmol/L (ref 98–111)
Creatinine, Ser: 2.73 mg/dL — ABNORMAL HIGH (ref 0.61–1.24)
GFR, Estimated: 25 mL/min — ABNORMAL LOW (ref >60)
Glucose, Bld: 124 mg/dL — ABNORMAL HIGH (ref 70–99)
Potassium: 3.7 mmol/L (ref 3.5–5.1)
Sodium: 141 mmol/L (ref 135–145)
Total Bilirubin: 0.9 mg/dL (ref 0.3–1.2)
Total Protein: 8.5 g/dL — ABNORMAL HIGH (ref 6.5–8.1)

## 2020-04-07 MED ORDER — ONDANSETRON 4 MG PO TBDP: 4.0000 mg | ORAL_TABLET | Freq: Three times a day (TID) | ORAL | 0 refills | Status: AC | PRN

## 2020-04-07 MED ORDER — NAPROXEN 500 MG PO TABS: 500.0000 mg | ORAL_TABLET | Freq: Two times a day (BID) | ORAL | 2 refills | Status: AC

## 2020-04-07 MED ORDER — ONDANSETRON 4 MG PO TBDP
4.0000 mg | ORAL_TABLET | Freq: Once | ORAL | Status: AC
  Administered 2020-04-07: 4 mg via ORAL
  Filled 2020-04-07: qty 1

## 2020-04-07 MED ORDER — MORPHINE SULFATE (PF) 4 MG/ML IV SOLN
4.0000 mg | Freq: Once | INTRAVENOUS | Status: AC
  Administered 2020-04-07: 4 mg via INTRAVENOUS
  Filled 2020-04-07: qty 1

## 2020-04-07 MED ORDER — KETOROLAC TROMETHAMINE 30 MG/ML IJ SOLN
30.0000 mg | Freq: Once | INTRAMUSCULAR | Status: AC
  Administered 2020-04-07: 30 mg via INTRAMUSCULAR
  Filled 2020-04-07: qty 1

## 2020-04-07 NOTE — ED Provider Notes (Signed)
[facility] Emergency Department Provider Note   ____________________________________________    I have reviewed the triage vital signs and the nursing notes.   HISTORY  Chief Complaint Flank Pain (Right )     HPI Jared Walker is a 69 y.o. male with a history as noted below who presents with complaints of right-sided flank pain.  Patient reports pain started earlier today and was sharp and radiated towards his groin.  He describes loss of appetite and mild nausea.  Does have a history of kidney stones and this feels similar.  Denies fevers or chills.  No dysuria.  Also has a history of chronic kidney disease.  Has not take anything for this.  Past Medical History:  Diagnosis Date  . Anemia   . Aortic stenosis, mild    mild AS by echo, 08/2011  . Cervical disc herniation   . CHF (congestive heart failure) ([place])   . Chronic kidney disease    kidney stones. chronic kidney disease, stage III. followed by doctor in [place]  . Cirrhosis ([place])    due to hepatitis c not being treated for a long periiod of time  . Coronary artery disease   . Depression   . Diabetes mellitus without complication ([place]) 85/6314   A1C 10.0  . Full dentures    upper and lower  . GERD (gastroesophageal reflux disease)   . Gout   . Heart murmur    no treatment  . Hep C w/o coma, chronic ([place]) 01/19/2015   took treatment and it is now gone.   . Hepatitis C 09/25/11  . HIV positive ([place]) 09/25/2011   nondetectable.   . Hypertension   . MI (myocardial infarction) ([place]) 2017   NSTEMI  . Neuromuscular disorder (HCC)    numbness in fingers most likely due to shoulder damage.    Patient Active Problem List   Diagnosis Date Noted  . Acute encephalopathy 11/30/2016  . Dyspnea 03/28/2016  . Elevated troponin 03/28/2016  . Acute on chronic systolic CHF (congestive heart failure) ([place]) 03/28/2016  . Swelling of lower extremity 03/28/2016  . HIV disease ([place])  03/28/2016  . Acute on chronic diastolic CHF (congestive heart failure) ([place])   . Cirrhosis of liver without ascites ([place] )   . Chronic renal failure in pediatric patient, stage 3 (moderate) ([place])   . BP (high blood pressure) 12/11/2015  . Acute non-ST elevation myocardial infarction (NSTEMI) ([place]) 11/13/2015  . Chest pain 11/09/2015  . Bradycardia 11/09/2015  . Abnormal EKG 11/09/2015  . Iron deficiency anemia 08/27/2015  . Musculoskeletal chest pain 01/19/2015  . Anemia in stage 3 chronic kidney disease ([place]) 01/19/2015  . GI bleed 01/19/2015  . Pica in adults 01/19/2015  . Anemia due to stage 3b chronic kidney disease ([place]) 01/19/2015  . HIV (human immunodeficiency virus infection) ([place]) 01/19/2015  . HTN (hypertension) 01/19/2015  . Chronic hepatitis C without hepatic coma ([place]) 01/19/2015  . CKD (chronic kidney disease) 01/19/2015  . History of arthroscopy of shoulder 11/13/2014    Past Surgical History:  Procedure Laterality Date  . BACK SURGERY  1985   lumbar laminectomy. metal in pelvis  . bulging disc Bilateral 1990   L4-L5  . CARDIOVASCULAR STRESS TEST     at [facility]  . CHOLECYSTECTOMY    . CLOSED REDUCTION PELVIC FRACTURE    . ESOPHAGOGASTRODUODENOSCOPY (EGD) WITH PROPOFOL N/A 01/21/2015   Procedure: ESOPHAGOGASTRODUODENOSCOPY (EGD) WITH PROPOFOL;  Surgeon: Manya Silvas, MD;  Location: [facility] ENDOSCOPY;  Service: Endoscopy;  Laterality: N/A;  . ESOPHAGOGASTRODUODENOSCOPY (EGD) WITH PROPOFOL N/A 09/04/2015   Procedure: ESOPHAGOGASTRODUODENOSCOPY (EGD) WITH PROPOFOL;  Surgeon: Manya Silvas, MD;  Location: [facility] ENDOSCOPY;  Service: Endoscopy;  Laterality: N/A;  . FRACTURE SURGERY Right    hand.  pins in hand from long ago  . KNEE ARTHROSCOPY  2009   Right  . LUMBAR LAMINECTOMY/DECOMPRESSION MICRODISCECTOMY  09/25/2011   Procedure: LUMBAR LAMINECTOMY/DECOMPRESSION MICRODISCECTOMY;  Surgeon: Erline Levine, MD;  Location: [place] NEURO ORS;  Service: Neurosurgery;  Laterality:  Left;  Left Lumbar Four-Five Microdiskectomy  . NEPHROSTOMY     tube Left  . SHOULDER ARTHROSCOPY WITH OPEN ROTATOR CUFF REPAIR Right 10/12/2014   Procedure: SHOULDER ARTHROSCOP with decompression;  Surgeon: Leanor Kail, MD;  Location: [facility];  Service: Orthopedics;  Laterality: Right;  . SHOULDER ARTHROSCOPY WITH OPEN ROTATOR CUFF REPAIR Left 07/08/2017   Procedure: SHOULDER ARTHROSCOPY WITH OPEN ROTATOR CUFF REPAIR;  Surgeon: Corky Mull, MD;  Location: [facility] ORS;  Service: Orthopedics;  Laterality: Left;    Prior to Admission medications   Medication Sig Start Date End Date Taking? Authorizing Provider  albuterol (PROVENTIL HFA;VENTOLIN HFA) 108 (90 Base) MCG/ACT inhaler Inhale 1-2 puffs into the lungs every 6 (six) hours as needed for wheezing or shortness of breath.  08/12/16   [provider]  allopurinol (ZYLOPRIM) 100 MG tablet Take 300 mg by mouth daily.     [provider]  amLODipine (NORVASC) 5 MG tablet Take 5 mg by mouth daily.  11/13/15 08/02/37  [provider]  aspirin EC 81 MG EC tablet Take 1 tablet (81 mg total) by mouth daily. 11/11/15   Bettey Costa, MD  atorvastatin (LIPITOR) 80 MG tablet Take 80 mg by mouth daily. Take in the morning. 11/13/15 08/02/37  [provider]  carvedilol (COREG) 12.5 MG tablet Take 12.5 mg by mouth 2 (two) times daily with a meal.    [provider]  Cholecalciferol (VITAMIN D-1000 MAX ST) 1000 units tablet Take 1,000 Units by mouth daily.    [provider]  cloNIDine (CATAPRES) 0.1 MG tablet Take 0.1 mg by mouth 3 (three) times daily.    [provider]  Cranberry 400 MG CAPS Take 400 mg by mouth daily.    [provider]  DESCOVY 200-25 MG tablet Take 1 tablet by mouth at bedtime.  08/12/15   [provider]  diazepam (VALIUM) 5 MG tablet Take 5 mg by mouth at bedtime.     [provider]  dolutegravir (TIVICAY) 50 MG tablet Take 50 mg by mouth at  bedtime.  07/31/15   [provider]  donepezil (ARICEPT) 10 MG tablet Take 10 mg by mouth at bedtime.  Patient not taking: Reported on 11/13/2019 05/02/18 05/18/23  [provider]  ferrous sulfate 325 (65 FE) MG tablet Take 325 mg by mouth daily with breakfast.  01/10/15 08/02/37  [provider]  folic acid (FOLVITE) 1 MG tablet Take 1 mg by mouth daily. In the morning    [provider]  glimepiride (AMARYL) 2 MG tablet Take 4 mg by mouth daily with breakfast.     [provider]  HYDROcodone-acetaminophen (NORCO/VICODIN) 5-325 MG tablet Directions are take one tablet 30 minutes before physical therapy, then take one tablet at bedtime as needed for pain 02/20/16   [provider]  JANUVIA 50 MG tablet Take 50 mg by mouth daily. 10/25/19   [provider]  losartan (COZAAR) 100 MG tablet Take 100 mg by mouth every morning.  10/31/15   [provider]  naproxen (NAPROSYN) 500 MG tablet Take 1 tablet (500 mg total) by mouth 2 (two) times daily with a meal. 04/07/20   Lavonia Drafts, MD  nystatin (MYCOSTATIN) 100000 UNIT/ML suspension Take 5 mLs by mouth 4 (four) times daily as needed for mouth pain. 10/08/15   [provider]  Omega-3 Fatty Acids (FISH OIL PO) Take by mouth.    [provider]  ondansetron (ZOFRAN ODT) 4 MG disintegrating tablet Take 1 tablet (4 mg total) by mouth every 8 (eight) hours as needed. 04/07/20   Lavonia Drafts, MD  ondansetron (ZOFRAN) 4 MG tablet TAKE 1 TABLET BY MOUTH EVERY 8 HOURS IF NEEDED FOR NAUSEA 06/03/17   [provider]  pantoprazole (PROTONIX) 40 MG tablet Take 1 tablet by mouth daily. 08/12/17   [provider]  sertraline (ZOLOFT) 50 MG tablet Take 50 mg by mouth every evening.     [provider]  tamsulosin (FLOMAX) 0.4 MG CAPS capsule Take 1 capsule by mouth daily. 12/22/17   [provider]  torsemide (DEMADEX) 20 MG tablet Take 1 tablet (20 mg  total) by mouth daily. 03/28/16   Theodoro Grist, MD     Allergies Oxycodone-acetaminophen, Buchu-cornsilk-ch grass-hydran, Colchicine, Nifedipine, and Tramadol  Family History  Problem Relation Age of Onset  . Hypertension Mother   . Cancer Father   . Heart disease Sister   . Diabetes Brother   . Anesthesia problems Neg Hx     Social History Social History   Tobacco Use  . Smoking status: Former Smoker    Packs/day: 0.50    Types: Cigarettes    Quit date: 06/1978    Years since quitting: 41.8  . Smokeless tobacco: Former Systems developer    Quit date: 03/13/1972  Vaping Use  . Vaping Use: Never used  Substance Use Topics  . Alcohol use: No  . Drug use: No    Review of Systems  Constitutional: No fever/chills Eyes: No visual changes.  ENT: No sore throat. Cardiovascular: Denies chest pain. Respiratory: Denies shortness of breath. Gastrointestinal: As above Genitourinary: Negative for dysuria.  Positive hematuria Musculoskeletal: Negative for back pain. Skin: Negative for rash. Neurological: Negative for headaches or weakness   ____________________________________________   PHYSICAL EXAM:  VITAL SIGNS: ED Triage Vitals  Enc Vitals Group     BP 04/07/20 1803 (!) 181/96     Pulse Rate 04/07/20 1803 68     Resp 04/07/20 1803 20     Temp 04/07/20 1803 98.7 F (37.1 C)     Temp Source 04/07/20 1803 Oral     SpO2 04/07/20 1803 97 %     Weight 04/07/20 1805 95.3 kg (210 lb)     Height 04/07/20 1805 1.803 m (5\' 11" )     Head Circumference --      Peak Flow --      Pain Score 04/07/20 1805 10     Pain Loc --      Pain Edu? --      Excl. in [place]? --     Constitutional: Alert and oriented.  Nose: No congestion/rhinnorhea. Mouth/Throat: Mucous membranes are moist.    Cardiovascular: Normal rate, regular rhythm. Grossly normal heart sounds.  Good peripheral circulation. Respiratory: Normal respiratory effort.  No retractions. Lungs CTAB. Gastrointestinal: Soft and  nontender. No distention.  Mild right CVA tenderness Genitourinary: deferred Musculoskeletal: Warm and well perfused  Neurologic:  Normal speech and language. No gross focal neurologic deficits are appreciated.  Skin:  Skin is warm, dry and intact. No rash noted. Psychiatric: Mood and affect are normal. Speech and behavior are normal.  ____________________________________________   LABS (all labs ordered are listed, but only abnormal results are displayed)  Labs Reviewed  URINALYSIS, COMPLETE (UACMP) WITH MICROSCOPIC - Abnormal; Notable for the following components:      Result Value   Color, Urine YELLOW (*)    APPearance HAZY (*)    Hgb urine dipstick LARGE (*)    Protein, ur 30 (*)    RBC / HPF >50 (*)    Bacteria, UA RARE (*)    All other components within normal limits  COMPREHENSIVE METABOLIC PANEL - Abnormal; Notable for the following components:   Glucose, Bld 124 (*)    BUN 24 (*)    Creatinine, Ser 2.73 (*)    Calcium 10.4 (*)    Total Protein 8.5 (*)    ALT 59 (*)    GFR, Estimated 25 (*)    All other components within normal limits  CBC - Abnormal; Notable for the following components:   WBC 11.0 (*)    All other components within normal limits   ____________________________________________  EKG  None ____________________________________________  RADIOLOGY  CT renal stone study reviewed by me, stone noted in the bladder ____________________________________________   PROCEDURES  Procedure(s) performed: No  Procedures   Critical Care performed: No ____________________________________________   INITIAL IMPRESSION / ASSESSMENT AND PLAN / ED COURSE  Pertinent labs & imaging results that were available during my care of the patient were reviewed by me and considered in my medical decision making (see chart for details).  Patient presents with right-sided flank pain as described above, highly suspicious for ureterolithiasis, given his history.   Urinalysis demonstrates positive hemoglobin no evidence of infection.  Treated with IM Toradol with significant improvement.  CT scan read by radiology demonstrating 4 mm stone likely recently passed  Patient reevaluated, pain has significantly improved, appropriate discharge with outpatient follow-up as needed.    ____________________________________________   FINAL CLINICAL IMPRESSION(S) / ED DIAGNOSES  Final diagnoses:  Ureterolithiasis        Note:  This document was prepared using Dragon voice recognition software and may include unintentional dictation errors.   Lavonia Drafts, MD 04/07/20 2208

## 2020-04-07 NOTE — ED Triage Notes (Signed)
Pt states ride side flank pain, since this morning history of kidney stones, pt states nausea denies any emesis. Denies any hematuria. Pt talks in complete sentences

## 2020-04-08 DIAGNOSIS — N184 Chronic kidney disease, stage 4 (severe): Principal | ICD-10-CM

## 2020-04-15 NOTE — Unmapped (Signed)
-----   Message from Griffin Dakin, MD sent at 04/12/2020  8:25 AM EST -----  please repeat non contrast chest ct in 37m re: pulmonary nodules d/t smoking hx. thank you.  ----- Message -----  From: Interface, Rad Results In  Sent: 03/29/2020  10:03 AM EST  To: Griffin Dakin, MD

## 2020-04-22 DIAGNOSIS — N184 Chronic kidney disease, stage 4 (severe): Principal | ICD-10-CM

## 2020-04-24 NOTE — Unmapped (Signed)
[facility] Clinical Assessment & Refill Coordination Note    [name], DOB: Jan 19, 1951  Phone: [phone] (home)     All above HIPAA information was verified with patient.     Was a Nurse, learning disability used for this call? No    Specialty Medication(s):   Infectious Disease: Descovy and Tivicay     Current Outpatient Medications   Medication Sig Dispense Refill   ??? albuterol (PROVENTIL HFA;VENTOLIN HFA) 90 mcg/actuation inhaler Inhale 2 puffs. Take as needed     ??? allopurinoL (ZYLOPRIM) 100 MG tablet Take 250 mg by mouth daily.      ??? amLODIPine (NORVASC) 2.5 MG tablet Take 3 tablets (7.5 mg total) by mouth every morning. 90 tablet 3   ??? aspirin (ECOTRIN) 81 MG tablet daily.   0   ??? atenoloL (TENORMIN) 100 MG tablet Take 1 tablet (100 mg total) by mouth daily. 90 tablet 3   ??? BACLOFEN, BULK, TOP Apply topically. Baclofen 2%, Diclofenac 5%, Gabapentin 6%, Tetracaine 3%    Take as needed     ??? blood sugar diagnostic (ONETOUCH VERIO TEST STRIPS) Strp Use 2 (two) times daily E11.22     ??? calcium carbonate-vitamin D2 500 mg(1,250mg ) -200 unit tablet Take 1 tablet by mouth Two (2) times a day.      ??? carvediloL (COREG) 12.5 MG tablet 12.5 mg Two (2) times a day.      ??? cloNIDine HCL (CATAPRES) 0.1 MG tablet Take 1 tablet (0.1 mg total) by mouth 3 (three) times a day. 270 tablet 3   ??? cysteamine bitartrate (PROCYSBI) 300 mg grdp 1 each.     ??? diazepam (VALIUM) 5 MG tablet Take 5 mg by mouth nightly.      ??? dicyclomine (BENTYL) 10 mg capsule Take 10 mg by mouth two (2) times a day as needed.      ??? docusate sodium (COLACE) 100 MG capsule Take 1 capsule (100 mg total) by mouth every twelve (12) hours. (Patient taking differently: Take 100 mg by mouth every twelve (12) hours. Take as needed) 60 capsule 0   ??? dolutegravir (TIVICAY) 50 mg TABLET Take 1 tablet by mouth daily 30 tablet 11   ??? dorzolamide-timoloL (COSOPT) 22.3-6.8 mg/mL ophthalmic solution Administer 1 drop to both eyes Two (2) times a day.      ??? doxazosin (CARDURA) 4 MG tablet Take 1 tablet (4 mg total) by mouth nightly. 90 tablet 3   ??? emtricitabine-tenofovir alafen (DESCOVY) 200-25 mg tablet Take 1 tablet by mouth daily 30 tablet 11   ??? ferrous sulfate 325 (65 FE) MG tablet Take 325 mg by mouth daily with breakfast.      ??? folic acid (FOLVITE) 1 MG tablet   0   ??? gabapentin (NEURONTIN) 300 MG capsule 300 mg Two (2) times a day.      ??? glimepiride (AMARYL) 4 MG tablet Take 1 tablet (4 mg total) by mouth daily. 90 tablet 3   ??? hydrALAZINE (APRESOLINE) 100 MG tablet Take 1 tablet (100 mg total) by mouth Two (2) times a day. 180 tablet 3   ??? HYDROcodone-acetaminophen (NORCO) 5-325 mg per tablet Directions are take one tablet 30 minutes before physical therapy, then take one tablet at bedtime as needed for pain     ??? lisinopriL (PRINIVIL,ZESTRIL) 40 MG tablet Take 1 tablet (40 mg total) by mouth daily. 90 tablet 3   ??? meTOLAzone (ZAROXOLYN) 10 MG tablet Take 1 tablet (10 mg  total) by mouth daily. 90 tablet 3   ??? nitroglycerin (NITROSTAT) 0.4 MG SL tablet DISSOLVE ONE TABLET UNDER TONGUE AS NEEDED FOR CHEST PAIN EVERY 5 MINUTES AS DIRECTED 25 tablet 0   ??? nystatin (MYCOSTATIN) 100,000 unit/mL suspension Take 5 mL by mouth daily.      ??? ondansetron (ZOFRAN) 4 MG tablet take 1 tablet by mouth every 8 hours if needed for nausea     ??? oxyCODONE-acetaminophen (PERCOCET) 5-325 mg per tablet Take 1 tablet by mouth daily.      ??? ROCKLATAN 0.02-0.005 % Drop INT 1 GTT INTO OU QHS     ??? sertraline (ZOLOFT) 50 MG tablet take 1 tablet by mouth once daily     ??? SITagliptin (JANUVIA) 50 MG tablet Take 1 tablet (50 mg total) by mouth daily. 90 tablet 3   ??? spironolactone (ALDACTONE) 25 MG tablet Take 1 tablet (25 mg total) by mouth daily. 90 tablet 3   ??? tamsulosin (FLOMAX) 0.4 mg capsule TAKE 1 CAPSULE(0.4 MG) BY MOUTH DAILY 90 capsule 3   ??? torsemide (DEMADEX) 20 MG tablet Take 2 tablets (40 mg total) by mouth daily. 90 tablet 3     No current facility-administered medications for this visit.        Changes to medications: Negan reports no changes at this time.    Allergies   Allergen Reactions   ??? Colchicine Analogues Diarrhea     Have diarrhea when taken for long periods of time   ??? Tramadol Other (See Comments)     unknown tolerates morphine       Changes to allergies: No    SPECIALTY MEDICATION ADHERENCE     Tivicay 50 mg: 10 days of medicine on hand   Descovy        : 10 days of medicine on hand       Medication Adherence    Patient reported X missed doses in the last month: 0  Specialty Medication: Tivicay 50mg   Patient is on additional specialty medications: Yes  Additional Specialty Medications: Descovy  Patient Reported Additional Medication X Missed Doses in the Last Month: 0  Patient is on more than two specialty medications: No  Any gaps in refill history greater than 2 weeks in the last 3 months: no  Demonstrates understanding of importance of adherence: yes  Informant: patient  Provider-estimated medication adherence level: good  Patient is at risk for Non-Adherence: No          Specialty medication(s) dose(s) confirmed: Regimen is correct and unchanged.     Are there any concerns with adherence? No    Adherence counseling provided? Not needed    CLINICAL MANAGEMENT AND INTERVENTION      Clinical Benefit Assessment:    Do you feel the medicine is effective or helping your condition? Yes    HIV ASSOCIATED LABS:     Lab Results   Component Value Date/Time    HIVRS Not Detected 01/29/2020 12:22 PM    HIVRS Detected (A) 12/26/2019 10:24 AM    HIVRS Not Detected 10/10/2019 04:38 PM    HIVRS Not Detected 08/15/2014 11:42 AM    HIVRS Not Detected 01/03/2014 10:46 AM    HIVRS Not Detected 11/23/2012 12:53 PM    HIVCP <40 (H) 12/26/2019 10:24 AM    HIVCP <40 05/16/2019 02:24 PM    HIVCP <40 09/12/2018 10:06 AM    HIVCP <40 (H) 12/01/2017 10:16 AM    HIVCP <40 (H) 09/01/2017 11:34 AM  ACD4 649 01/29/2020 12:22 PM    ACD4 736 12/26/2019 10:24 AM    ACD4 693 10/10/2019 04:38 PM    ACD4 514 08/01/2014 09:35 AM    ACD4 430 (L) 01/03/2014 10:46 AM    ACD4 549 11/23/2012 12:53 PM       Clinical Benefit counseling provided? Labs from 01/29/20 show evidence of clinical benefit    Adverse Effects Assessment:    Are you experiencing any side effects? No    Are you experiencing difficulty administering your medicine? No    Quality of Life Assessment:    How many days over the past month did your HIV  keep you from your normal activities? For example, brushing your teeth or getting up in the morning. 0    Have you discussed this with your provider? Not needed    Therapy Appropriateness:    Is therapy appropriate? Yes, therapy is appropriate and should be continued    DISEASE/MEDICATION-SPECIFIC INFORMATION      N/A    PATIENT SPECIFIC NEEDS     - Does the patient have any physical, cognitive, or cultural barriers? No    - Is the patient high risk? No    - Does the patient require a Care Management Plan? No     - Does the patient require physician intervention or other additional services (i.e. nutrition, smoking cessation, social work)? No      SHIPPING     Specialty Medication(s) to be Shipped:   Infectious Disease: Descovy and Tivicay    Other medication(s) to be shipped: No additional medications requested for fill at this time     Changes to insurance: No    Delivery Scheduled: Yes, Expected medication delivery date: 05/03/20.     Medication will be delivered via Next Day Courier to the confirmed prescription address in [facility].    The patient will receive a drug information handout for each medication shipped and additional FDA Medication Guides as required.  Verified that patient has previously received a Conservation officer, historic buildings.    All of the patient's questions and concerns have been addressed.    Roderic Palau   [facility] Specialty Pharmacist

## 2020-05-02 MED FILL — TIVICAY 50 MG TABLET: 30 days supply | Qty: 30 | Fill #5 | Status: AC

## 2020-05-02 MED FILL — TIVICAY 50 MG TABLET: ORAL | 30 days supply | Qty: 30 | Fill #5

## 2020-05-02 MED FILL — DESCOVY 200 MG-25 MG TABLET: 30 days supply | Qty: 30 | Fill #5 | Status: AC

## 2020-05-02 MED FILL — DESCOVY 200 MG-25 MG TABLET: ORAL | 30 days supply | Qty: 30 | Fill #5

## 2020-05-06 DIAGNOSIS — N184 Chronic kidney disease, stage 4 (severe): Principal | ICD-10-CM

## 2020-05-08 ENCOUNTER — Telehealth
Admit: 2020-05-08 | Discharge: 2020-05-09 | Payer: MEDICARE | Attending: Infectious Disease | Primary: Infectious Disease

## 2020-05-08 DIAGNOSIS — B20 Human immunodeficiency virus [HIV] disease: Principal | ICD-10-CM

## 2020-05-08 DIAGNOSIS — N186 End stage renal disease: Principal | ICD-10-CM

## 2020-05-08 DIAGNOSIS — K7460 Unspecified cirrhosis of liver: Principal | ICD-10-CM

## 2020-05-08 NOTE — Unmapped (Signed)
Assessment/Plan:              HIV:  Viral load below detectable (01/29/2020) on current ART (Descovy/dolutegravir), which he tolerates well. I reviewed chart in anticipation of today's visit. We discussed labs.  Will continue current regimen. Cirrhosis well compensated. Renal insufficiency continue to be his biggest current medical problem; he is being evaluated for kidney transplant and so far appears to be a good candidate (pending completion of cardiovascular exam). I see no HIV-related contraindications to kidney transplantation.  I have asked Jonah to arrange labs.    Diarrhea: Cause unclear. Is being managed by his PCP, Dr Marcello Fennel.     Obesity: Has worked with nutrition, improved his diet, and is trying to exercise.    S/p L shoulder surgery:  Followed by his PCP.    Cirrhosis: Followed by GI.     SVR following Zepatier for Chronic HCV Infection (genotype 1a): Stable.    CKD Stage G4:A2-- Followed by Dr Stefano Gaul, who has referred him to Transplant Hepatology, given concern for worsened GFR in setting of cirrhosis.    HFpEF: Followed by Cardiology.    HTN: Stable.    Sexual Health:     Mental Health: Stable.    Health maintenance:   Reports COVID vaccine (Moderna) x 2 at [facility] at least 2 mos ago (~07/2019). Booster ~03/2020  Pap N/A  RPR False + (1:2 with neg FTA 11/12/2015)  GC/CT - neg 08/26/2016  Hb A1C 01/29/2020: 5.8   Cholesterol See below  TB screening neg, 01/29/2020  Lung cancer screening N/A (quit 1993 after 1 ppd x 10 yrs)  Colonoscopy - Requested for screening          Prevention, adherence and health education:   .  .  .  .  .  .  .    Educational and counseling services took 25  minutes of today's visit.    Follow-up:  Return to clinic in 1 month or sooner if needed.   Subjective:    Samuel [name] is a 69 y.o. male who presents to the Infectious Disease clinic for return HIV visit.     Chief Complaint: No chief complaint on file.      HPI:   HIV diagnosed in 12/1999. In 07/13/00, CD4 count was 547 (25%) with a viral load of about 5293. For a number of years, his viral load and CD4 count were generally reasonably well maintained in the absence of antiretroviral therapy, and he never had any HIV-related symptoms. However, during 2008-2009, his viral load dramatically increased and his CD4 count fell. Viral load reached a height of 309,000 on 08/31/2007 and CD4 count fell substantially to 244 on 09/29/2007. He had always been reluctant to take antiviral medications and in fact consistently and repeatedly refused hepatitis C treatment as well as HIV treatment in the past. However, because of the deterioration of his CD4 count and viral load, I prescribed Atripla on 10/12/07. Although he agreed to take it he did not done so until more than a year later, despite assurances during each visit that he would begin ART immediately. Once he finally started ART, however, he tolerated it quite well. On 07/24/2015 he was switched from Atripla to Descovy/dolutegravir b/o CKD. Course has been complicated by DM (well controlled), well compensated cirrhosis, NSTEMI in 2017, and progressively worsened renal disease (now Stage 4).         I spent 20 minutes on the real-time audio and video with the patient  on the date of service. I spent an additional 20 minutes on pre- and post-visit activities on the date of service.     The patient was physically located in [place] or a state in which I am permitted to provide care. The patient and/or parent/guardian understood that s/he may incur co-pays and cost sharing, and agreed to the telemedicine visit. The visit was reasonable and appropriate under the circumstances given the patient's presentation at the time.    The patient and/or parent/guardian has been advised of the potential risks and limitations of this mode of treatment (including, but not limited to, the absence of in-person examination) and has agreed to be treated using telemedicine. The patient's/patient's family's questions regarding telemedicine have been answered.     If the visit was completed in an ambulatory setting, the patient and/or parent/guardian has also been advised to contact their provider???s office for worsening conditions, and seek emergency medical treatment and/or call 911 if the patient deems either necessary.      His HIV has been well controlled, but he has multiple severe comorbidities, including HCV (SVR after 12 wks of Zepatier 04/20/2016-07/13/2016; HCV RNA BDL 10/28/2016), resultant cirrhosis (Metavir F4), CKD now Stage 4:A2, NSTEMI 10/2015. He is followed closely by Nephrology. Also followed by GI, Cardiology, and PCP (Dr Marcello Fennel). He is being evaluated for renal transplantation.    04/07/2020 Local ER for R flank pain, which was attributed to nephrolithiasis -- felt likely that stone had just passed. Pain improved substantially in ER and resolved completely soon after.     Had been dieting to lose weight and feeling fine until the past week when he began having watery brown diarrhea band gas after eating. No symptoms in the absence of food. No fever abd pain, n, v, blood in stools.  Imodium doesn't help. Consulted Dr Marcello Fennel, (who suggested Imodium) and told him to come in and get a stool testing kit, which he plans to do. He says he has not had any similar episodes in the past. Aside from diarrhea, he feels OK. No malaise or weakness.    Has missed no ART doses since I last saw him. Still working on exercise. Gets 5K-5K steps per day on [employer] Watch. Sleep test planned per patient.   ROS otherwise unremarkable.         Past Medical History:   Diagnosis Date   ??? CKD (chronic kidney disease) stage 3, GFR 30-59 ml/min (CMS-HCC) 01/08/2015   ??? Coronary artery disease 10/2015    Multivessel disease, plan medical management after cath 6/17   ??? GERD (gastroesophageal reflux disease)    ??? Gout    ??? HIV (human immunodeficiency virus infection) (CMS-HCC)     Undectable viral load 5/17   ??? Hypertension    ??? Nephrolithiasis    ??? Nephrotic syndrome 03/05/2014   ??? Renal mass     Followed by neprhology, MRI 8/16 improved, felt to be benign       Medications:  Current Outpatient Medications   Medication Sig Dispense Refill   ??? albuterol (PROVENTIL HFA;VENTOLIN HFA) 90 mcg/actuation inhaler Inhale 2 puffs. Take as needed     ??? allopurinoL (ZYLOPRIM) 100 MG tablet Take 250 mg by mouth daily.      ??? amLODIPine (NORVASC) 2.5 MG tablet Take 3 tablets (7.5 mg total) by mouth every morning. 90 tablet 3   ??? aspirin (ECOTRIN) 81 MG tablet daily.   0   ??? atenoloL (TENORMIN) 100 MG tablet Take 1  tablet (100 mg total) by mouth daily. 90 tablet 3   ??? BACLOFEN, BULK, TOP Apply topically. Baclofen 2%, Diclofenac 5%, Gabapentin 6%, Tetracaine 3%    Take as needed     ??? blood sugar diagnostic (ONETOUCH VERIO TEST STRIPS) Strp Use 2 (two) times daily E11.22     ??? calcium carbonate-vitamin D2 500 mg(1,250mg ) -200 unit tablet Take 1 tablet by mouth Two (2) times a day.      ??? carvediloL (COREG) 12.5 MG tablet 12.5 mg Two (2) times a day.      ??? cloNIDine HCL (CATAPRES) 0.1 MG tablet Take 1 tablet (0.1 mg total) by mouth 3 (three) times a day. 270 tablet 3   ??? cysteamine bitartrate (PROCYSBI) 300 mg grdp 1 each.     ??? diazepam (VALIUM) 5 MG tablet Take 5 mg by mouth nightly.      ??? dicyclomine (BENTYL) 10 mg capsule Take 10 mg by mouth two (2) times a day as needed.      ??? docusate sodium (COLACE) 100 MG capsule Take 1 capsule (100 mg total) by mouth every twelve (12) hours. (Patient taking differently: Take 100 mg by mouth every twelve (12) hours. Take as needed) 60 capsule 0   ??? dolutegravir (TIVICAY) 50 mg TABLET Take 1 tablet by mouth daily 30 tablet 11   ??? dorzolamide-timoloL (COSOPT) 22.3-6.8 mg/mL ophthalmic solution Administer 1 drop to both eyes Two (2) times a day.      ??? doxazosin (CARDURA) 4 MG tablet Take 1 tablet (4 mg total) by mouth nightly. 90 tablet 3   ??? emtricitabine-tenofovir alafen (DESCOVY) 200-25 mg tablet Take 1 tablet by mouth daily 30 tablet 11   ??? ferrous sulfate 325 (65 FE) MG tablet Take 325 mg by mouth daily with breakfast.      ??? folic acid (FOLVITE) 1 MG tablet   0   ??? gabapentin (NEURONTIN) 300 MG capsule 300 mg Two (2) times a day.      ??? glimepiride (AMARYL) 4 MG tablet Take 1 tablet (4 mg total) by mouth daily. 90 tablet 3   ??? hydrALAZINE (APRESOLINE) 100 MG tablet Take 1 tablet (100 mg total) by mouth Two (2) times a day. 180 tablet 3   ??? HYDROcodone-acetaminophen (NORCO) 5-325 mg per tablet Directions are take one tablet 30 minutes before physical therapy, then take one tablet at bedtime as needed for pain     ??? lisinopriL (PRINIVIL,ZESTRIL) 40 MG tablet Take 1 tablet (40 mg total) by mouth daily. 90 tablet 3   ??? meTOLAzone (ZAROXOLYN) 10 MG tablet Take 1 tablet (10 mg total) by mouth daily. 90 tablet 3   ??? nitroglycerin (NITROSTAT) 0.4 MG SL tablet DISSOLVE ONE TABLET UNDER TONGUE AS NEEDED FOR CHEST PAIN EVERY 5 MINUTES AS DIRECTED 25 tablet 0   ??? nystatin (MYCOSTATIN) 100,000 unit/mL suspension Take 5 mL by mouth daily.      ??? ondansetron (ZOFRAN) 4 MG tablet take 1 tablet by mouth every 8 hours if needed for nausea     ??? oxyCODONE-acetaminophen (PERCOCET) 5-325 mg per tablet Take 1 tablet by mouth daily.      ??? ROCKLATAN 0.02-0.005 % Drop INT 1 GTT INTO OU QHS     ??? sertraline (ZOLOFT) 50 MG tablet take 1 tablet by mouth once daily     ??? SITagliptin (JANUVIA) 50 MG tablet Take 1 tablet (50 mg total) by mouth daily. 90 tablet 3   ??? spironolactone (ALDACTONE) 25 MG tablet  Take 1 tablet (25 mg total) by mouth daily. 90 tablet 3   ??? tamsulosin (FLOMAX) 0.4 mg capsule TAKE 1 CAPSULE(0.4 MG) BY MOUTH DAILY 90 capsule 3   ??? torsemide (DEMADEX) 20 MG tablet Take 2 tablets (40 mg total) by mouth daily. 90 tablet 3     No current facility-administered medications for this visit.       Allergies: Colchicine analogues and Tramadol    Social History:  As per MEDICAL RECORD NUMBERNo cigs or alcohol at all. No current IDU (quit many years ago). Lives with wife.     Review of Systems:  A 12 point review of systems was negative except for pertinent items noted in the HPI.    Objective:       There were no vitals taken for this visit.    GEN:  looks well, no apparent distress  PSYCH:attentive, appropriate affect, good eye contact, fluent speech     Recent Labs:    Lab Results   Component Value Date    RPR Nonreactive 01/29/2020    RPR Nonreactive 06/08/2018    GCNAA Negative 06/08/2018    CTNAA Negative 06/08/2018    A1C 5.8 (H) 01/29/2020    A1C 6.8 (H) 06/08/2018    A1C 5.7 11/12/2015    CHOL 88 01/29/2020    CHOL 111 09/16/2016    CHOL 148 11/12/2015    LDL 27 (L) 01/29/2020    LDL 39 (L) 09/16/2016    LDL 80 11/12/2015    HDL 23 (L) 01/29/2020    HDL 32 (L) 09/16/2016    HDL 37 (L) 11/12/2015    TRIG 188 (H) 01/29/2020    TRIG 198 (H) 09/16/2016    TRIG 155 (H) 11/12/2015    CHOLHDLRATIO 3.8 01/29/2020    CHOLHDLRATIO 3.5 09/16/2016    CHOLHDLRATIO 4.0 11/12/2015    QFTTBGOLD Negative 01/29/2020         Absolute CD4 Count   Date Value Ref Range Status   01/29/2020 649 510-2,320 /uL Final   12/26/2019 736 510-2,320 /uL Final   10/10/2019 693 510-2,320 /uL Final   09/01/2017 417 (L) 510-2,320 /uL Final   08/01/2014 514 510 - 2,320 /uL Final   01/03/2014 430 (L) 510 - 2,320 /uL Final   11/23/2012 549 510 - 2,320 /uL Final   07/20/2012 470 (L) 510 - 2,320 CELLS/UL Final     CD4% (T Helper)   Date Value Ref Range Status   01/29/2020 31 (L) 34 - 58 % Final   12/26/2019 32 (L) 34 - 58 % Final   10/10/2019 33 (L) 34 - 58 % Final   09/01/2017 27 (L) 34 - 58 % Final   08/01/2014 35 34 - 58 % Final   01/03/2014 34 34 - 58 % Final   11/23/2012 31 (L) 34 - 58 % Final   07/20/2012 29 (L) 34 - 58 % OF LYMPHS Final     HIV RNA Quant Result   Date Value Ref Range Status   01/29/2020 Not Detected Not Detected Final   12/26/2019 Detected (A) Not Detected Final   10/10/2019 Not Detected Not Detected Final   06/08/2018 Not Detected Not Detected Final   08/15/2014 Not Detected  Final   01/03/2014 Not Detected  Final   11/23/2012 Not Detected  Final   07/20/2012 Not Detected  Final     HIV RNA   Date Value Ref Range Status   12/26/2019 <40 (H) <0 copies/mL Final  05/16/2019 <40 copies/mL Final     Comment:     HIV-1 RNA not detected  The reportable range for this assay is 40 to 10,000,000  copies HIV-1 RNA/mL.     09/12/2018 <40 copies/mL Final     Comment:     HIV-1 RNA not detected  The reportable range for this assay is 40 to 10,000,000  copies HIV-1 RNA/mL.     12/01/2017 <40 (H) <0 copies/mL Final   09/01/2017 <40 (H) <0 copies/mL Final   04/02/2017 <40 (H) <0 copies/mL Final     HIV RNA Log(10)   Date Value Ref Range Status   12/26/2019   Final     Comment:     <1.6 log   05/16/2019 CANCELED log10copy/mL      Comment:     Unable to calculate result since non-numeric result obtained for  component test.    Result canceled by the ancillary.     09/12/2018 CANCELED log10copy/mL      Comment:     Unable to calculate result since non-numeric result obtained for  component test.    Result canceled by the ancillary.     12/01/2017  <0.00 log copies/mL Final     Comment:     <1.6 log   09/01/2017  <0.00 log copies/mL Final     Comment:     <1.6 log   04/02/2017  <0.00 log copies/mL Final     Comment:     <1.6 log         Absolute CD4 Count   Date Value Ref Range Status   01/29/2020 649 510-2,320 /uL Final   12/26/2019 736 510-2,320 /uL Final   10/10/2019 693 510-2,320 /uL Final   09/01/2017 417 (L) 510-2,320 /uL Final   08/01/2014 514 510 - 2,320 /uL Final   01/03/2014 430 (L) 510 - 2,320 /uL Final   11/23/2012 549 510 - 2,320 /uL Final   07/20/2012 470 (L) 510 - 2,320 CELLS/UL Final     HIV RNA   Date Value Ref Range Status   12/26/2019 <40 (H) <0 copies/mL Final   05/16/2019 <40 copies/mL Final     Comment:     HIV-1 RNA not detected  The reportable range for this assay is 40 to 10,000,000  copies HIV-1 RNA/mL.     09/12/2018 <40 copies/mL Final     Comment:     HIV-1 RNA not detected  The reportable range for this assay is 40 to 10,000,000  copies HIV-1 RNA/mL.     12/01/2017 <40 (H) <0 copies/mL Final   09/01/2017 <40 (H) <0 copies/mL Final   04/02/2017 <40 (H) <0 copies/mL Final      Lab Results   Component Value Date    WBC 9.5 01/29/2020    WBC 9.8 05/16/2019    HGB 15.0 01/29/2020    HGB 14.3 05/16/2019    Platelet 140 (L) 01/29/2020    Platelet 149 (L) 05/16/2019    Creatinine 2.56 (H) 03/26/2020    AST 51 (H) 01/29/2020    AST 48 (H) 01/29/2020    AST 31 05/16/2019    ALT 96 (H) 01/29/2020    ALT 95 (H) 01/29/2020    ALT 37 05/16/2019    Triglycerides 188 (H) 01/29/2020    Triglycerides 80 01/03/2014    HDL 23 (L) 01/29/2020    HDL 53 01/03/2014    LDL Calculated 27 (L) 01/29/2020    LDL Cholesterol, Calculated 71 01/03/2014  Immunization History   Administered Date(s) Administered   ??? HEPB-CPG,ADULT DOSAGE (2 DOSE SCHEDULE)ADJUVANTED, IM 01/31/2020   ??? Hepatitis B, Adult 04/06/2016, 05/21/2016, 08/24/2016   ??? INFLUENZA INJ MDCK PF, QUAD,(FLUCELVAX)(34MO AND UP EGG FREE) 02/28/2019   ??? INFLUENZA TIV (TRI) PF (IM) 02/08/2008, 03/26/2010, 03/25/2011   ??? Influenza Vaccine Quad (IIV4 PF) 42mo+ injectable 07/24/2015, 02/05/2016, 03/08/2018, 03/05/2020   ??? Influenza Virus Vaccine, unspecified formulation 06/15/2014, 07/24/2015, 02/05/2016, 03/15/2016, 03/31/2017   ??? PNEUMOCOCCAL POLYSACCHARIDE 23 07/13/2000, 08/19/2005, 12/01/2017   ??? PPD Test 12/29/2006, 07/11/2008, 03/26/2010, 03/25/2011   ??? Pneumococcal Conjugate 13-Valent 04/13/2012   ??? SHINGRIX-ZOSTER VACCINE (HZV), RECOMBINANT,SUB-UNIT,ADJUVANTED IM 09/16/2016, 12/01/2017   ??? TdaP 08/31/2007, 10/27/2018   ??? Tuberculin Skin Test;unspecified Formulation 12/29/2006, 07/11/2008, 03/26/2010, 03/25/2011

## 2020-05-08 NOTE — Unmapped (Signed)
Discussed with patient the need to get his lab work done and [facility] will [facility]  [facility] at [facility]. Patient knows to contact me with any problem or concerns

## 2020-05-08 NOTE — Unmapped (Signed)
Please  Follow up with Dr Marcello Fennel today (as you planned) about diarrhea.  Contact Jonah to arrange labs.

## 2020-05-10 ENCOUNTER — Other Ambulatory Visit: Payer: Self-pay | Admitting: *Deleted

## 2020-05-10 DIAGNOSIS — N1832 Chronic kidney disease, stage 3b: Secondary | ICD-10-CM

## 2020-05-10 DIAGNOSIS — D631 Anemia in chronic kidney disease: Secondary | ICD-10-CM

## 2020-05-13 ENCOUNTER — Inpatient Hospital Stay: Payer: BC Managed Care – PPO | Admitting: Internal Medicine

## 2020-05-13 ENCOUNTER — Inpatient Hospital Stay: Payer: BC Managed Care – PPO | Attending: Internal Medicine

## 2020-05-14 LAB — LYMPHOCYTE MARKERS LIMITED
% CD 3 POS. LYMPH.: 73 % (ref 57.5–86.2)
% NK (CD56/16): 17 % (ref 1.4–19.4)
AB NK (CD56): 425 /uL — ABNORMAL HIGH (ref 24–406)
ABSOLUTE CD 3: 1825 /uL (ref 622–2402)
ABSOLUTE CD8 CNT: 968 /uL — ABNORMAL HIGH (ref 109–897)
BANDED NEUTROPHILS ABSOLUTE COUNT: 0 10*3/uL (ref 0.0–0.1)
BASOPHILS ABSOLUTE COUNT: 0 10*3/uL (ref 0.0–0.2)
BASOPHILS RELATIVE PERCENT: 0 %
CD4 % HELPER T CELL: 34.1 % (ref 30.8–58.5)
CD4 T CELL ABSOLUTE: 853 /uL (ref 359–1519)
CD4:CD8 RATIO: 0.88 — ABNORMAL LOW (ref 0.92–3.72)
CD8 % SUPPRESSOR T CELL: 38.7 % — ABNORMAL HIGH (ref 12.0–35.5)
EOSINOPHILS ABSOLUTE COUNT: 0.1 10*3/uL (ref 0.0–0.4)
EOSINOPHILS RELATIVE PERCENT: 2 %
HEMATOCRIT: 44.1 % (ref 37.5–51.0)
HEMOGLOBIN: 15.6 g/dL (ref 13.0–17.7)
IMMATURE GRANULOCYTES: 0 %
LYMPHOCYTES ABSOLUTE COUNT: 2.5 10*3/uL (ref 0.7–3.1)
LYMPHOCYTES RELATIVE PERCENT: 29 %
MEAN CORPUSCULAR HEMOGLOBIN CONC: 35.4 g/dL (ref 31.5–35.7)
MEAN CORPUSCULAR HEMOGLOBIN: 32.3 pg (ref 26.6–33.0)
MEAN CORPUSCULAR VOLUME: 91 fL (ref 79–97)
MONOCYTES ABSOLUTE COUNT: 0.8 10*3/uL (ref 0.1–0.9)
MONOCYTES RELATIVE PERCENT: 10 %
NEUTROPHILS ABSOLUTE COUNT: 5.1 10*3/uL (ref 1.4–7.0)
NEUTROPHILS RELATIVE PERCENT: 59 %
PLATELET COUNT: 148 10*3/uL — ABNORMAL LOW (ref 150–450)
RED BLOOD CELL COUNT: 4.83 x10E6/uL (ref 4.14–5.80)
RED CELL DISTRIBUTION WIDTH: 12 % (ref 11.6–15.4)
WHITE BLOOD CELL COUNT: 8.6 10*3/uL (ref 3.4–10.8)

## 2020-05-14 LAB — BASIC METABOLIC PANEL
BLOOD UREA NITROGEN: 23 mg/dL (ref 8–27)
BUN / CREAT RATIO: 9 — ABNORMAL LOW (ref 10–24)
CALCIUM: 10.1 mg/dL (ref 8.6–10.2)
CHLORIDE: 103 mmol/L (ref 96–106)
CO2: 25 mmol/L (ref 20–29)
CREATININE: 2.53 mg/dL — ABNORMAL HIGH (ref 0.76–1.27)
GFR MDRD AF AMER: 29 mL/min/{1.73_m2} — ABNORMAL LOW
GFR MDRD NON AF AMER: 25 mL/min/{1.73_m2} — ABNORMAL LOW
GLUCOSE: 96 mg/dL (ref 65–99)
POTASSIUM: 4.6 mmol/L (ref 3.5–5.2)
SODIUM: 143 mmol/L (ref 134–144)

## 2020-05-14 LAB — HIV RNA, QUANTITATIVE, PCR: HIV RNA: <40 {copies}/mL

## 2020-05-14 LAB — BILIRUBIN, TOTAL: BILIRUBIN TOTAL: 0.8 mg/dL (ref 0.0–1.2)

## 2020-05-14 LAB — AST: AST (SGOT): 56 IU/L — ABNORMAL HIGH (ref 0–40)

## 2020-05-14 LAB — ALT: ALT (SGPT): 101 IU/L — ABNORMAL HIGH (ref 0–44)

## 2020-05-20 DIAGNOSIS — N184 Chronic kidney disease, stage 4 (severe): Principal | ICD-10-CM

## 2020-05-29 MED FILL — DESCOVY 200 MG-25 MG TABLET: ORAL | 30 days supply | Qty: 30 | Fill #6

## 2020-05-29 MED FILL — TIVICAY 50 MG TABLET: 30 days supply | Qty: 30 | Fill #6 | Status: AC

## 2020-05-29 MED FILL — DESCOVY 200 MG-25 MG TABLET: 30 days supply | Qty: 30 | Fill #6 | Status: AC

## 2020-05-29 MED FILL — TIVICAY 50 MG TABLET: ORAL | 30 days supply | Qty: 30 | Fill #6

## 2020-05-29 NOTE — Unmapped (Signed)
The [facility] has made a second and final attempt to reach this patient to refill the following medication:descovy & tivicay.      We have left voicemails on the following phone numbers: [phone].    Dates contacted: 12/22,12/29  Last scheduled delivery: 12/02    The patient may be at risk of non-compliance with this medication. The patient should call the [facility] at [phone] (option 4) to refill medication.    Antonietta Barcelona   [facility] Specialty Technician

## 2020-05-29 NOTE — Unmapped (Signed)
[facility] Refill Coordination Note    Specialty Medication(s) to be Shipped:   Infectious Disease: Descovy and Tivicay    Other medication(s) to be shipped: No additional medications requested for fill at this time     [name], DOB: [birth year]  Phone: [phone] (home)       All above HIPAA information was verified with patient's caregiver, Johnny Bridge     Was a translator used for this call? No    Completed refill call assessment today to schedule patient's medication shipment from the [facility] [phone]).       Specialty medication(s) and dose(s) confirmed: Regimen is correct and unchanged.   Changes to medications: Charvis reports no changes at this time.  Changes to insurance: No  Questions for the pharmacist: No    Confirmed patient received Welcome Packet with first shipment. The patient will receive a drug information handout for each medication shipped and additional FDA Medication Guides as required.       DISEASE/MEDICATION-SPECIFIC INFORMATION        N/A    SPECIALTY MEDICATION ADHERENCE     Medication Adherence    Patient reported X missed doses in the last month: 0  Specialty Medication: Descovy 200-25mg   Patient is on additional specialty medications: Yes  Additional Specialty Medications: Tivicay 50mg   Patient Reported Additional Medication X Missed Doses in the Last Month: 0  Patient is on more than two specialty medications: No        Descovy 200-25 mg: 5 days of medicine on hand   Tivicay 50 mg: 5 days of medicine on hand     SHIPPING     Shipping address confirmed in Epic.     Delivery Scheduled: Yes, Expected medication delivery date: 05/30/2020.     Medication will be delivered via Next Day Courier to the prescription address in Epic WAM.    Oretha Milch   [facility] Specialty Technician

## 2020-06-03 DIAGNOSIS — N184 Chronic kidney disease, stage 4 (severe): Principal | ICD-10-CM

## 2020-06-17 DIAGNOSIS — N184 Chronic kidney disease, stage 4 (severe): Principal | ICD-10-CM

## 2020-06-18 ENCOUNTER — Ambulatory Visit: Admit: 2020-06-18 | Discharge: 2020-06-19 | Payer: MEDICARE

## 2020-06-18 DIAGNOSIS — R161 Splenomegaly, not elsewhere classified: Principal | ICD-10-CM

## 2020-06-18 DIAGNOSIS — N200 Calculus of kidney: Principal | ICD-10-CM

## 2020-06-18 DIAGNOSIS — K7469 Other cirrhosis of liver: Principal | ICD-10-CM

## 2020-06-19 NOTE — Unmapped (Signed)
HISTORY OF PRESENT ILLNESS:      Samuel [name] is a 70 year old man with stage G4:A2 chronic kidney disease who is evaluated today for stage G4:A2 chronic kidney disease.  Over the past several weeks, he has experienced increased abdominal bloating and gaseous distention.  He also notes concurrent loose stools.  He has not had increased leg edema, chest pain, PND, or orthopnea.  His systolic blood pressure has been running somewhat high over the past several weeks, and in office measured reading today was elevated at 155/79 mmHg.    MEDICATIONS:     Medication Sig   ??? albuterol (PROVENTIL HFA;VENTOLIN HFA) 90 mcg/actuation inhaler Inhale 2 puffs. Take as needed   ??? allopurinoL (ZYLOPRIM) 100 MG tablet Take 250 mg by mouth daily.    ??? amLODIPine (NORVASC) 2.5 MG tablet Take 3 tablets (7.5 mg total) by mouth every morning.   ??? aspirin (ECOTRIN) 81 MG tablet daily.    ??? atenoloL (TENORMIN) 100 MG tablet Take 1 tablet (100 mg total) by mouth daily.   ??? BACLOFEN, BULK, TOP Apply topically. Baclofen 2%, Diclofenac 5%, Gabapentin 6%, Tetracaine 3%    Take as needed   ??? blood sugar diagnostic (ONETOUCH VERIO TEST STRIPS) Strp Use 2 (two) times daily E11.22   ??? calcium carbonate-vitamin D2 500 mg(1,250mg ) -200 unit tablet Take 1 tablet by mouth Two (2) times a day.    ??? carvediloL (COREG) 12.5 MG tablet 12.5 mg Two (2) times a day.    ??? cloNIDine HCL (CATAPRES) 0.1 MG tablet Take 1 tablet (0.1 mg total) by mouth 3 (three) times a day.   ??? cysteamine bitartrate (PROCYSBI) 300 mg grdp 1 each.   ??? diazepam (VALIUM) 5 MG tablet Take 5 mg by mouth nightly.    ??? dicyclomine (BENTYL) 10 mg capsule Take 10 mg by mouth two (2) times a day as needed.    ??? docusate sodium (COLACE) 100 MG capsule Take 1 capsule (100 mg total) by mouth every twelve (12) hours. (Patient taking differently: Take 100 mg by mouth every twelve (12) hours. Take as needed)   ??? dolutegravir (TIVICAY) 50 mg TABLET Take 1 tablet by mouth daily   ??? dorzolamide-timoloL (COSOPT) 22.3-6.8 mg/mL ophthalmic solution Administer 1 drop to both eyes Two (2) times a day.    ??? doxazosin (CARDURA) 4 MG tablet Take 1 tablet (4 mg total) by mouth nightly.   ??? emtricitabine-tenofovir alafen (DESCOVY) 200-25 mg tablet Take 1 tablet by mouth daily   ??? ferrous sulfate 325 (65 FE) MG tablet Take 325 mg by mouth daily with breakfast.    ??? folic acid (FOLVITE) 1 MG tablet    ??? gabapentin (NEURONTIN) 300 MG capsule 300 mg Two (2) times a day.    ??? glimepiride (AMARYL) 4 MG tablet Take 1 tablet (4 mg total) by mouth daily.   ??? hydrALAZINE (APRESOLINE) 100 MG tablet Take 1 tablet (100 mg total) by mouth Two (2) times a day.   ??? HYDROcodone-acetaminophen (NORCO) 5-325 mg per tablet Directions are take one tablet 30 minutes before physical therapy, then take one tablet at bedtime as needed for pain   ??? lisinopriL (PRINIVIL,ZESTRIL) 40 MG tablet Take 1 tablet (40 mg total) by mouth daily.   ??? meTOLAzone (ZAROXOLYN) 10 MG tablet Take 1 tablet (10 mg total) by mouth daily.   ??? nitroglycerin (NITROSTAT) 0.4 MG SL tablet DISSOLVE ONE TABLET UNDER TONGUE AS NEEDED FOR CHEST PAIN EVERY 5 MINUTES AS DIRECTED   ???  nystatin (MYCOSTATIN) 100,000 unit/mL suspension Take 5 mL by mouth daily.    ??? ondansetron (ZOFRAN) 4 MG tablet take 1 tablet by mouth every 8 hours if needed for nausea   ??? ROCKLATAN 0.02-0.005 % Drop INT 1 GTT INTO OU QHS   ??? sertraline (ZOLOFT) 50 MG tablet take 1 tablet by mouth once daily   ??? SITagliptin (JANUVIA) 50 MG tablet Take 1 tablet (50 mg total) by mouth daily.   ??? spironolactone (ALDACTONE) 25 MG tablet Take 1 tablet (25 mg total) by mouth daily.   ??? tamsulosin (FLOMAX) 0.4 mg capsule TAKE 1 CAPSULE(0.4 MG) BY MOUTH DAILY   ??? metroNIDAZOLE (FLAGYL) 500 MG tablet Take 1 tablet (500 mg total) by mouth Three (3) times a day for 10 days.   ??? oxyCODONE-acetaminophen (PERCOCET) 5-325 mg per tablet Take 1 tablet by mouth daily.  (Patient not taking: Reported on 07/02/2020)   ??? torsemide (DEMADEX) 20 MG tablet Take 2 tablets (40 mg total) by mouth daily.       REVIEW OF SYSTEMS:  Constitutional: No fevers or chills. Cardiovascular: No chest pain.  Pulmonary:  All other systems are reviewed and are negative except that noted in the history of present illness.    PHYSICAL EXAMINATION:   ??  Constitutional: He is in no acute distress.  Vital signs:????BP 155/79 (BP Site: R Arm, BP Position: Sitting, BP Cuff Size: Large)  - Pulse 77  - Temp 37.1 ??C (98.8 ??F) (Temporal)  - Ht 180.3 cm (5' 10.98)  - Wt 97.1 kg (214 lb)  - BMI 29.86 kg/m??   Wt Readings from Last 3 Encounters:   07/02/20 97.1 kg (214 lb)   03/05/20 97.4 kg (214 lb 12.8 oz)   01/31/20 100.1 kg (220 lb 9.6 oz)   HEENT: Masked.    Neck: Supple without lymphadenopathy or thyromegaly. No carotid bruits.   Lungs: Clear to auscultation   Heart: Regular rate and rhythm. Normal S1-S2.   Abdomen: Soft, nontender, normoactive bowel sounds, without organomegaly or mass. No abdominal bruits. ????  Neurologic: Alert and oriented x 3.????Strength is normal. ??DTRs are 2+ and symmetric. ??There are no lateralizing sensory deficits.  Extremities: Trace pedal edema. Dorsalis pedis and posterior tibial pulses are palpable. ??  Node survey: No lymphadenopathy.   Musculoskeletal: No active synovitis.??  Skin/Nails: ??No rash or nail changes.??     LABORATORY STUDIES:    Glucose 174??High??    Sodium 140    Potassium 4.1    Chloride 106    Carbon Dioxide (CO2) 25.8    Urea Nitrogen (BUN) 23    Creatinine 2.4??High??    Glomerular Filtration Rate (eGFR), MDRD Estimate 33??Low??    Calcium 10.1    AST  78??High??    ALT  146??High??    Alk Phos (alkaline Phosphatase) 82    Albumin 4.5    Bilirubin, Total 0.9    Protein, Total 7.3    A/G Ratio 1.6     Component 07/01/20   WBC (White Blood Cell Count) 10.6   RBC (Red Blood Cell Count) 4.44   Hemoglobin 14.1   Hematocrit 41.5   MCV (Mean Corpuscular Volume) 93.5   MCH (Mean Corpuscular Hemoglobin) 31.8   MCHC (Mean Corpuscular Hemoglobin Concentration) 34.0   Platelet Count 138   RDW-CV (Red Cell Distribution Width) 12.5   MPV (Mean Platelet Volume) 10.4   Neutrophils 7.26   Lymphocytes 2.25   Monocytes 0.86   Eosinophils 0.15   Basophils 0.04  Neutrophil % 68.4   Lymphocyte % 21.2   Monocyte % 8.1   Eosinophil % 1.4   Basophil% 0.4   Immature Granulocyte % 0.5   Immature Granulocyte Count 0.05      07/01/20   Urine Albumin, Random 200     IMAGING:    CT of the abdomen and pelvis 03/29/20:    IMPRESSION:    No calcified atherosclerotic disease in the bilateral external iliac arteries.  ??  Bilateral nonobstructive nephrolithiasis.  ??  There is a surgical screw anterior to the right hip joint resting within the quadriceps muscles that appears to have dislodged from the fixation plate adjacent to the acetabulum. This screw appears to have been in this position since at least the 2013 study. In the 2007 study, this screw appears in place within the fixation plate adjacent to the acetabulum.  ??  New punctate sclerotic foci in the left acetabulum and left superior pubic ramus, indeterminate.  ??  0.4 cm pulmonary nodules in the lower lobe right lung. Follow-up per Fleischner criteria as below.  ??  ??  Recommend follow-up as per the 2017 Fleischner society guidelines (pulmonary nodules smaller than 8 mm detected incidentally in patients >35 years on non-screening CT)  ??  ==============================================================  ??  For single solid nodules < 6 mm:  low-risk patients: no routine follow-up required  high-risk patients: optional CT at 12 months (particularly with suspicious nodule morphology and/or upper lobe location; see risk assessment below)      IMPRESSION AND PLAN:    1.  Stage G4:A2 chronic kidney disease.  His kidney function is stable.  His blood pressure is not at goal.  Therefore, amlodipine was increased to 10 mg once daily.  ??  2.  Hypertension.  As above, amlodipine was increased to 10 mg daily.  He will continue torsemide 40 mg once daily, spironolactone 25 mg once daily, atenolol 100 mg once daily, doxazosin 4 mg daily, lisinopril 40 mg once daily, and hydralazine 100 mg twice daily.      3.  Human immunodeficiency virus disease.  He is doing well on HAART therapy.     4.  Lower urinary tract symptoms. Continue tamsulosin.    5.  Secondary/tertiary hyperparathyroidism.  The serum calcium level is acceptable.    6.  Nephrolithiasis.    A recent CT scan showed nonobstructing stones in both kidneys. He spontaneously passed two small stone fragments in November. I suspect that no further intervention is required at this time. However, it would be reasonable for him to be seen in the stone clinic at some point. I will discuss a referral with him.  ??  7.  Abdominal distention.   I suspect his symptoms are secondary to bacterial overgrowth.  He will be treated with an empiric course of metronidazole 500 mg 3 times daily x10 days.    8.  Mild sleep apnea.  He has been referred for a CPAP titration study.    9.  Right hip screw dislodgment, chronic. I have referred him to [facility]   ??  10. Follow up plans.  He will return to see me in about 3 months.        Zetta Bills. Stefano Gaul, MD  Date of service 07/02/2020

## 2020-06-21 NOTE — Unmapped (Signed)
[facility] Refill Coordination Note    Specialty Medication(s) to be Shipped:   Infectious Disease: Descovy and Tivicay    Other medication(s) to be shipped: No additional medications requested for fill at this time     Samuel [name], DOB: 1951/02/08  Phone: [phone] (home)       All above HIPAA information was verified with patient.     Was a Nurse, learning disability used for this call? No    Completed refill call assessment today to schedule patient's medication shipment from the [facility] [phone]).       Specialty medication(s) and dose(s) confirmed: Regimen is correct and unchanged.   Changes to medications: Samuel [name] reports no changes at this time.  Changes to insurance: No  Questions for the pharmacist: No    Confirmed patient received Welcome Packet with first shipment. The patient will receive a drug information handout for each medication shipped and additional FDA Medication Guides as required.       DISEASE/MEDICATION-SPECIFIC INFORMATION        N/A    SPECIALTY MEDICATION ADHERENCE     Medication Adherence    Patient reported X missed doses in the last month: 0  Specialty Medication: descovy  Patient is on additional specialty medications: Yes  Additional Specialty Medications: tivicay  Patient Reported Additional Medication X Missed Doses in the Last Month: 0          Unable to confirm quantity on hand      SHIPPING     Shipping address confirmed in Epic.     Delivery Scheduled: Yes, Expected medication delivery date: 1/28.     Medication will be delivered via Next Day Courier to the prescription address in Epic WAM.    Samuel [name]   [facility] Specialty Technician

## 2020-06-27 MED FILL — DESCOVY 200 MG-25 MG TABLET: ORAL | 30 days supply | Qty: 30 | Fill #7

## 2020-06-27 MED FILL — TIVICAY 50 MG TABLET: ORAL | 30 days supply | Qty: 30 | Fill #7

## 2020-07-01 DIAGNOSIS — N184 Chronic kidney disease, stage 4 (severe): Principal | ICD-10-CM

## 2020-07-01 NOTE — Unmapped (Signed)
Spoke with pt and confirmed surgeon appointment on 08/01/20. Confirmed address and sent letter.

## 2020-07-02 ENCOUNTER — Ambulatory Visit: Admit: 2020-07-02 | Discharge: 2020-07-03 | Payer: MEDICARE | Attending: Nephrology | Primary: Nephrology

## 2020-07-02 DIAGNOSIS — Z6829 Body mass index (BMI) 29.0-29.9, adult: Principal | ICD-10-CM

## 2020-07-02 DIAGNOSIS — N184 Chronic kidney disease, stage 4 (severe): Principal | ICD-10-CM

## 2020-07-02 DIAGNOSIS — K6389 Other specified diseases of intestine: Principal | ICD-10-CM

## 2020-07-02 DIAGNOSIS — N186 End stage renal disease: Principal | ICD-10-CM

## 2020-07-02 DIAGNOSIS — I5033 Acute on chronic diastolic (congestive) heart failure: Principal | ICD-10-CM

## 2020-07-02 DIAGNOSIS — B182 Chronic viral hepatitis C: Principal | ICD-10-CM

## 2020-07-02 DIAGNOSIS — I214 Non-ST elevation (NSTEMI) myocardial infarction: Principal | ICD-10-CM

## 2020-07-02 DIAGNOSIS — B20 Human immunodeficiency virus [HIV] disease: Principal | ICD-10-CM

## 2020-07-02 DIAGNOSIS — I2510 Atherosclerotic heart disease of native coronary artery without angina pectoris: Principal | ICD-10-CM

## 2020-07-02 DIAGNOSIS — K7469 Other cirrhosis of liver: Principal | ICD-10-CM

## 2020-07-02 MED ORDER — AMLODIPINE 10 MG TABLET
ORAL_TABLET | Freq: Every day | ORAL | 3 refills | 90.00000 days | Status: CP
Start: 2020-07-02 — End: 2021-07-02

## 2020-07-02 MED ORDER — METRONIDAZOLE 500 MG TABLET
ORAL_TABLET | Freq: Three times a day (TID) | ORAL | 0 refills | 10.00000 days | Status: CP
Start: 2020-07-02 — End: 2020-07-12

## 2020-07-02 NOTE — Unmapped (Addendum)
1. Metronidazole 500 mg three times daily for 10 days    2. Labs today-I will call you with the results    3. Increase amlodipine to 10 mg daily.

## 2020-07-08 MED ORDER — ATORVASTATIN 40 MG TABLET
0 days
Start: 2020-07-08 — End: ?

## 2020-07-15 ENCOUNTER — Ambulatory Visit: Admit: 2020-07-15 | Discharge: 2020-07-16 | Payer: MEDICARE

## 2020-07-15 ENCOUNTER — Ambulatory Visit
Admit: 2020-07-15 | Discharge: 2020-07-16 | Payer: MEDICARE | Attending: Orthopaedic Trauma | Primary: Orthopaedic Trauma

## 2020-07-15 DIAGNOSIS — S72001A Fracture of unspecified part of neck of right femur, initial encounter for closed fracture: Principal | ICD-10-CM

## 2020-07-15 DIAGNOSIS — N184 Chronic kidney disease, stage 4 (severe): Principal | ICD-10-CM
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
## 2020-07-15 NOTE — Unmapped (Signed)
If you would like a referral to the spine center or physical therapy, please contact our office.  Call us if you have any questions or any new symptoms.    Thank you for choosing [facility]!  We appreciate the opportunity to participate in your care.       If any questions or concerns arise after your visit, please do not hesitant to contact me by [facility] or by calling my nurse, Trish at [phone].     Please let me know if I can be of assistance with this or other orthopaedic issues in the future.     Lavenia Atlas, MD  [facility]

## 2020-07-19 NOTE — Unmapped (Signed)
Followed up on in-basket below--patient did not have any questions.  Lanora Manis, RN    Abdominal ultrasound  Received: 2 weeks ago  [facility], [place]  Kem Kays, RN  Please let patient know that his abdominal ultrasound continues to show evidence of cirrhosis, renal cysts and two renal stones. There are no worrisome findings for liver cancer.     If he has any questions please let me know.     Sincerely,     [place]

## 2020-07-23 NOTE — Unmapped (Signed)
-----   Message from Doyce Loose, MD sent at 07/07/2020  5:11 PM EST -----  Ok 4y  ----- Message -----  From: Durenda Age, RN  Sent: 06/28/2020   9:57 PM EST  To: Doyce Loose, MD    03/28/2020 CT for target review please.  Follow up?    Pre dialysis , BMI 29.96, ESRD: HTN  ----- Message -----  From: Interface, Rad Results In  Sent: 03/29/2020  10:03 AM EST  To: Durenda Age, RN

## 2020-07-29 DIAGNOSIS — N184 Chronic kidney disease, stage 4 (severe): Principal | ICD-10-CM

## 2020-07-30 NOTE — Unmapped (Signed)
[facility] Refill Coordination Note    Specialty Medication(s) to be Shipped:   Infectious Disease: Descovy and Tivicay    Other medication(s) to be shipped: No additional medications requested for fill at this time     Samuel [name], DOB: 1951-02-10  Phone: [phone] (home)       All above HIPAA information was verified with patient.     Was a Nurse, learning disability used for this call? No    Completed refill call assessment today to schedule patient's medication shipment from the [facility] [phone]).       Specialty medication(s) and dose(s) confirmed: Regimen is correct and unchanged.   Changes to medications: Tajah reports no changes at this time.  Changes to insurance: No  Questions for the pharmacist: No    Confirmed patient received Welcome Packet with first shipment. The patient will receive a drug information handout for each medication shipped and additional FDA Medication Guides as required.       DISEASE/MEDICATION-SPECIFIC INFORMATION        N/A    SPECIALTY MEDICATION ADHERENCE     Medication Adherence    Patient reported X missed doses in the last month: 0  Specialty Medication: descovy  Patient is on additional specialty medications: Yes  Additional Specialty Medications: tivicay  Patient Reported Additional Medication X Missed Doses in the Last Month: 0                tivicay  : 6 days of medicine on hand   descovy  : 6 days of medicine on hand         SHIPPING     Shipping address confirmed in Epic.     Delivery Scheduled: Yes, Expected medication delivery date: 3/3.     Medication will be delivered via Next Day Courier to the prescription address in Epic WAM.    [name]   [facility] Specialty Technician

## 2020-07-31 DIAGNOSIS — B20 Human immunodeficiency virus [HIV] disease: Principal | ICD-10-CM

## 2020-07-31 NOTE — Unmapped (Signed)
Samuel [name] 's Descovy and Tivicay shipment will be delayed as a result of a high copay.     I have reached out to the patient and communicated the delay. We will call the patient back to reschedule the delivery upon resolution. We have not confirmed the new delivery date.

## 2020-08-01 ENCOUNTER — Ambulatory Visit: Admit: 2020-08-01 | Discharge: 2020-08-02 | Payer: MEDICARE | Attending: Surgery | Primary: Surgery

## 2020-08-01 MED FILL — TIVICAY 50 MG TABLET: ORAL | 30 days supply | Qty: 30 | Fill #8

## 2020-08-01 MED FILL — DESCOVY 200 MG-25 MG TABLET: ORAL | 30 days supply | Qty: 30 | Fill #8

## 2020-08-01 NOTE — Unmapped (Signed)
Samuel [name] 's Descovy and Tivicay shipment will be sent out  as a result of a different copay.      I have reached out to the patient and communicated the delivery change. We will reschedule the medication for the delivery date that the patient agreed upon.  We have confirmed the delivery date as 08/02/20, via next day courier.

## 2020-08-01 NOTE — Unmapped (Signed)
Patient seen in surgery clinic today by Resident and Dr. Norma Fredrickson for his kidney transplant evaluation. I spent 20 minutes with patient reviewing the kidney transplant evaluation process including testing, KSC, and listing procedure, pre-op, transplant surgery, and post-op considerations.  Patient verbalizes and acknowledges understanding of education provided today.  Patient education checklist signed today.  Patient able to have questions and concerns addressed with transplant team today.     Smoker:  no  Urine output:  yes  Living donor:  No and Amy Woodard, RN (Living Donor Coordinator) card given to pt for potential donors to call  SSN: Verified    HM: Already received:  PPD results         Still need to complete/request:  Colonoscopy--will do locally but will call if unable to get locally.
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
## 2020-08-07 ENCOUNTER — Ambulatory Visit
Admit: 2020-08-07 | Discharge: 2020-08-08 | Payer: MEDICARE | Attending: Infectious Disease | Primary: Infectious Disease

## 2020-08-07 DIAGNOSIS — B20 Human immunodeficiency virus [HIV] disease: Principal | ICD-10-CM

## 2020-08-07 DIAGNOSIS — K7460 Unspecified cirrhosis of liver: Principal | ICD-10-CM

## 2020-08-07 DIAGNOSIS — N186 End stage renal disease: Principal | ICD-10-CM

## 2020-08-07 LAB — BASIC METABOLIC PANEL
ANION GAP: 7 mmol/L (ref 5–14)
BLOOD UREA NITROGEN: 29 mg/dL — ABNORMAL HIGH (ref 9–23)
BUN / CREAT RATIO: 11
CALCIUM: 10.4 mg/dL (ref 8.7–10.4)
CHLORIDE: 109 mmol/L — ABNORMAL HIGH (ref 98–107)
CO2: 24 mmol/L (ref 20.0–31.0)
CREATININE: 2.53 mg/dL — ABNORMAL HIGH
EGFR CKD-EPI AA MALE: 29 mL/min/{1.73_m2} — ABNORMAL LOW (ref >=60)
EGFR CKD-EPI NON-AA MALE: 25 mL/min/{1.73_m2} — ABNORMAL LOW (ref >=60)
GLUCOSE RANDOM: 132 mg/dL (ref 70–179)
POTASSIUM: 4.7 mmol/L (ref 3.4–4.8)
SODIUM: 140 mmol/L (ref 135–145)

## 2020-08-07 LAB — LYMPH MARKER LIMITED,FLOW
ABSOLUTE CD3 CNT: 1440 {cells}/uL (ref 915–3400)
ABSOLUTE CD4 CNT: 640 {cells}/uL (ref 510–2320)
ABSOLUTE CD8 CNT: 760 {cells}/uL (ref 180–1520)
CD3% (T CELLS): 72 % (ref 61–86)
CD4% (T HELPER): 32 % — ABNORMAL LOW (ref 34–58)
CD4:CD8 RATIO: 0.8 — ABNORMAL LOW (ref 0.9–4.8)
CD8% T SUPPRESR: 38 % (ref 12–38)

## 2020-08-07 LAB — CBC W/ AUTO DIFF
BASOPHILS ABSOLUTE COUNT: 0 10*9/L (ref 0.0–0.1)
BASOPHILS RELATIVE PERCENT: 0.5 %
EOSINOPHILS ABSOLUTE COUNT: 0.1 10*9/L (ref 0.0–0.5)
EOSINOPHILS RELATIVE PERCENT: 1.5 %
HEMATOCRIT: 39.5 % (ref 39.0–48.0)
HEMOGLOBIN: 13.6 g/dL (ref 12.9–16.5)
LYMPHOCYTES ABSOLUTE COUNT: 2 10*9/L (ref 1.1–3.6)
LYMPHOCYTES RELATIVE PERCENT: 23.1 %
MEAN CORPUSCULAR HEMOGLOBIN CONC: 34.5 g/dL (ref 32.0–36.0)
MEAN CORPUSCULAR HEMOGLOBIN: 31.8 pg (ref 25.9–32.4)
MEAN CORPUSCULAR VOLUME: 92.1 fL (ref 77.6–95.7)
MEAN PLATELET VOLUME: 8.6 fL (ref 6.8–10.7)
MONOCYTES ABSOLUTE COUNT: 0.6 10*9/L (ref 0.3–0.8)
MONOCYTES RELATIVE PERCENT: 6.8 %
NEUTROPHILS ABSOLUTE COUNT: 6 10*9/L (ref 1.8–7.8)
NEUTROPHILS RELATIVE PERCENT: 68.1 %
NUCLEATED RED BLOOD CELLS: 0 /100{WBCs} (ref <=4)
PLATELET COUNT: 147 10*9/L — ABNORMAL LOW (ref 150–450)
RED BLOOD CELL COUNT: 4.29 10*12/L (ref 4.26–5.60)
RED CELL DISTRIBUTION WIDTH: 13.5 % (ref 12.2–15.2)
WBC ADJUSTED: 8.8 10*9/L (ref 3.6–11.2)

## 2020-08-07 LAB — HIV RNA, QUANTITATIVE, PCR: HIV RNA QNT RSLT: NOT DETECTED

## 2020-08-07 LAB — AST: AST (SGOT): 63 U/L — ABNORMAL HIGH (ref <=34)

## 2020-08-07 LAB — BILIRUBIN, TOTAL: BILIRUBIN TOTAL: 0.8 mg/dL (ref 0.3–1.2)

## 2020-08-07 LAB — ALT: ALT (SGPT): 114 U/L — ABNORMAL HIGH (ref 10–49)

## 2020-08-07 MED ORDER — BICTEGRAVIR 50 MG-EMTRICITABINE 200 MG-TENOFOVIR ALAFENAM 25 MG TABLET
ORAL_TABLET | Freq: Every day | ORAL | 11 refills | 30.00000 days | Status: CP
Start: 2020-08-07 — End: ?
  Filled 2020-08-09: qty 30, 30d supply, fill #0

## 2020-08-07 NOTE — Unmapped (Signed)
Assessment/Plan:              HIV:  Viral load detectable but <40 on current ART (Descovy/dolutegravir), which he tolerates well. However, I switched him from this combination to [facility] today to simplify regimen and further increase potency. I reviewed chart in anticipation of today's visit. We discussed labs.  Will continue current regimen. Cirrhosis well compensated. Renal insufficiency continue to be his biggest current medical problem; he is being evaluated for kidney transplant and so far appears to be a good candidate (pending completion of cardiovascular exam). I see no HIV-related contraindications to kidney transplantation.     Diarrhea: Improved. Is being managed by his PCP, Dr Marcello Fennel.     Obesity: Has worked with nutrition, improved his diet, and is trying to exercise.    Cirrhosis: Followed by GI.     SVR following Zepatier for Chronic HCV Infection (genotype 1a): Stable.    CKD Stage G4:A2-- Followed by Dr Stefano Gaul, who has referred him to Transplant Hepatology, given concern for worsened GFR in setting of cirrhosis.    HFpEF: Followed by Cardiology.    HTN: Stable.    Sexual Health:     Mental Health: Stable.    Health maintenance:   Reports COVID vaccine (Moderna) x 2 at [facility] at least 2 mos ago (~07/2019). Booster ~03/2020. Reports another booster mid-Feb for total of 4 doses -- all Moderna.  Pap N/A  RPR False + (1:2 with neg FTA 11/12/2015)  GC/CT - neg 08/26/2016  Hb A1C 01/29/2020: 5.8   Cholesterol See below  TB screening neg, 01/29/2020  Lung cancer screening N/A (quit 1993 after 1 ppd x 10 yrs)  Colonoscopy - To be done by Dr Marcello Fennel          Prevention, adherence and health education:   .  .  .  .  .  .  .    Educational and counseling services took 25  minutes of today's visit.    Follow-up:  Return to clinic in 1 month or sooner if needed.   Subjective:    Samuel [name] is a 70 y.o. male who presents to the Infectious Disease clinic for return HIV visit.     Chief Complaint: No chief complaint on file.      HPI:   HIV diagnosed in 12/1999. In 07/13/00, CD4 count was 547 (25%) with a viral load of about 5293. For a number of years, his viral load and CD4 count were generally reasonably well maintained in the absence of antiretroviral therapy, and he never had any HIV-related symptoms. However, during 2008-2009, his viral load dramatically increased and his CD4 count fell. Viral load reached a height of 309,000 on 08/31/2007 and CD4 count fell substantially to 244 on 09/29/2007. He had always been reluctant to take antiviral medications and in fact consistently and repeatedly refused hepatitis C treatment as well as HIV treatment in the past. However, because of the deterioration of his CD4 count and viral load, I prescribed Atripla on 10/12/07. Although he agreed to take it he did not done so until more than a year later, despite assurances during each visit that he would begin ART immediately. Once he finally started ART, however, he tolerated it quite well. On 07/24/2015 he was switched from Atripla to Descovy/dolutegravir b/o CKD. Course has been complicated by DM (well controlled), well compensated cirrhosis, NSTEMI in 2017, and progressively worsened renal disease (now Stage 4).     His HIV has been well  controlled, but he has multiple severe comorbidities, including HCV (SVR after 12 wks of Zepatier 04/20/2016-07/13/2016; HCV RNA BDL 10/28/2016), resultant cirrhosis (Metavir F4), CKD now Stage 4:A2, NSTEMI 10/2015. He is followed closely by Nephrology. Also followed by GI, Cardiology, and PCP (Dr Marcello Fennel). He is being evaluated for renal transplantation.    Reported abd bloating, loose stools. Rx'd with metronidazole for bacterial overgrowth. Gas and diarrhea have now improved.       Has missed no ART doses since I last saw him. Still working on exercise. Still gets 5K steps per day on [employer].   ROS otherwise unremarkable.         Past Medical History:   Diagnosis Date   ??? CKD (chronic kidney disease) stage 3, GFR 30-59 ml/min (CMS-HCC) 01/08/2015   ??? Coronary artery disease 10/2015    Multivessel disease, plan medical management after cath 6/17   ??? GERD (gastroesophageal reflux disease)    ??? Gout    ??? HIV (human immunodeficiency virus infection) (CMS-HCC)     Undectable viral load 5/17   ??? Hypertension    ??? Nephrolithiasis    ??? Nephrotic syndrome 03/05/2014   ??? Renal mass     Followed by neprhology, MRI 8/16 improved, felt to be benign       Medications:  Current Outpatient Medications   Medication Sig Dispense Refill   ??? albuterol (PROVENTIL HFA;VENTOLIN HFA) 90 mcg/actuation inhaler Inhale 2 puffs. Take as needed     ??? allopurinoL (ZYLOPRIM) 100 MG tablet Take 250 mg by mouth daily.      ??? amLODIPine (NORVASC) 10 MG tablet Take 1 tablet (10 mg total) by mouth daily. 90 tablet 3   ??? aspirin (ECOTRIN) 81 MG tablet daily.   0   ??? atenoloL (TENORMIN) 100 MG tablet Take 1 tablet (100 mg total) by mouth daily. 90 tablet 3   ??? atorvastatin (LIPITOR) 40 MG tablet      ??? BACLOFEN, BULK, TOP Apply topically. Baclofen 2%, Diclofenac 5%, Gabapentin 6%, Tetracaine 3%    Take as needed     ??? blood sugar diagnostic (ONETOUCH VERIO TEST STRIPS) Strp Use 2 (two) times daily E11.22     ??? calcium carbonate-vitamin D2 500 mg(1,250mg ) -200 unit tablet Take 1 tablet by mouth Two (2) times a day.      ??? carvediloL (COREG) 12.5 MG tablet 12.5 mg Two (2) times a day.      ??? cholecalciferol, vitamin D3 25 mcg, 1,000 units,, 1,000 unit (25 mcg) tablet Take by mouth daily.     ??? cloNIDine HCL (CATAPRES) 0.1 MG tablet Take 1 tablet (0.1 mg total) by mouth 3 (three) times a day. 270 tablet 3   ??? diazepam (VALIUM) 5 MG tablet Take 5 mg by mouth nightly.      ??? dicyclomine (BENTYL) 10 mg capsule Take 10 mg by mouth two (2) times a day as needed.      ??? docusate sodium (COLACE) 100 MG capsule Take 1 capsule (100 mg total) by mouth every twelve (12) hours. (Patient taking differently: Take 100 mg by mouth every twelve (12) hours. Take as needed) 60 capsule 0   ??? dolutegravir (TIVICAY) 50 mg TABLET Take 1 tablet by mouth daily 30 tablet 11   ??? dorzolamide-timoloL (COSOPT) 22.3-6.8 mg/mL ophthalmic solution Administer 1 drop to both eyes Two (2) times a day.      ??? doxazosin (CARDURA) 4 MG tablet Take 1 tablet (4 mg total) by mouth nightly.  90 tablet 3   ??? emtricitabine-tenofovir alafen (DESCOVY) 200-25 mg tablet Take 1 tablet by mouth daily 30 tablet 11   ??? ferrous sulfate 325 (65 FE) MG tablet Take 325 mg by mouth daily with breakfast.      ??? folic acid (FOLVITE) 1 MG tablet   0   ??? gabapentin (NEURONTIN) 300 MG capsule 300 mg Two (2) times a day.      ??? glimepiride (AMARYL) 4 MG tablet Take 1 tablet (4 mg total) by mouth daily. 90 tablet 3   ??? hydrALAZINE (APRESOLINE) 100 MG tablet Take 1 tablet (100 mg total) by mouth Two (2) times a day. 180 tablet 3   ??? HYDROcodone-acetaminophen (NORCO) 5-325 mg per tablet Directions are take one tablet 30 minutes before physical therapy, then take one tablet at bedtime as needed for pain     ??? lisinopriL (PRINIVIL,ZESTRIL) 40 MG tablet Take 1 tablet (40 mg total) by mouth daily. 90 tablet 3   ??? meTOLAzone (ZAROXOLYN) 10 MG tablet Take 1 tablet (10 mg total) by mouth daily. 90 tablet 3   ??? metroNIDAZOLE (FLAGYL) 500 MG tablet Take 500 mg by mouth.     ??? nitroglycerin (NITROSTAT) 0.4 MG SL tablet DISSOLVE ONE TABLET UNDER TONGUE AS NEEDED FOR CHEST PAIN EVERY 5 MINUTES AS DIRECTED 25 tablet 0   ??? nystatin (MYCOSTATIN) 100,000 unit/mL suspension Take 5 mL by mouth daily.      ??? ondansetron (ZOFRAN) 4 MG tablet take 1 tablet by mouth every 8 hours if needed for nausea     ??? oxyCODONE-acetaminophen (PERCOCET) 5-325 mg per tablet Take 1 tablet by mouth daily.      ??? ROCKLATAN 0.02-0.005 % Drop INT 1 GTT INTO OU QHS     ??? sertraline (ZOLOFT) 50 MG tablet take 1 tablet by mouth once daily     ??? SITagliptin (JANUVIA) 50 MG tablet Take 1 tablet (50 mg total) by mouth daily. 90 tablet 3   ??? spironolactone (ALDACTONE) 25 MG tablet Take 1 tablet (25 mg total) by mouth daily. 90 tablet 3   ??? tamsulosin (FLOMAX) 0.4 mg capsule TAKE 1 CAPSULE(0.4 MG) BY MOUTH DAILY 90 capsule 3   ??? torsemide (DEMADEX) 20 MG tablet Take 2 tablets (40 mg total) by mouth daily. 90 tablet 3     No current facility-administered medications for this visit.       Allergies: Colchicine analogues and Tramadol    Social History:  As per MEDICAL RECORD NUMBERNo cigs or alcohol at all. No current IDU (quit many years ago). Lives with wife.     Review of Systems:  A 12 point review of systems was negative except for pertinent items noted in the HPI.    Objective:       There were no vitals taken for this visit.    GEN:  looks well, no apparent distress  PSYCH:attentive, appropriate affect, good eye contact, fluent speech     Recent Labs:    Lab Results   Component Value Date    RPR Nonreactive 01/29/2020    RPR Nonreactive 06/08/2018    GCNAA Negative 06/08/2018    CTNAA Negative 06/08/2018    A1C 5.8 (H) 01/29/2020    A1C 6.8 (H) 06/08/2018    A1C 5.7 11/12/2015    CHOL 88 01/29/2020    CHOL 111 09/16/2016    CHOL 148 11/12/2015    LDL 27 (L) 01/29/2020    LDL 39 (L) 09/16/2016  LDL 80 11/12/2015    HDL 23 (L) 01/29/2020    HDL 32 (L) 09/16/2016    HDL 37 (L) 11/12/2015    TRIG 188 (H) 01/29/2020    TRIG 198 (H) 09/16/2016    TRIG 155 (H) 11/12/2015    CHOLHDLRATIO 3.8 01/29/2020    CHOLHDLRATIO 3.5 09/16/2016    CHOLHDLRATIO 4.0 11/12/2015    QFTTBGOLD Negative 01/29/2020         Absolute CD4 Count   Date Value Ref Range Status   01/29/2020 649 510-2,320 /uL Final   12/26/2019 736 510-2,320 /uL Final   10/10/2019 693 510-2,320 /uL Final   09/01/2017 417 (L) 510-2,320 /uL Final   08/01/2014 514 510 - 2,320 /uL Final   01/03/2014 430 (L) 510 - 2,320 /uL Final   11/23/2012 549 510 - 2,320 /uL Final   07/20/2012 470 (L) 510 - 2,320 CELLS/UL Final     CD4% (T Helper)   Date Value Ref Range Status   01/29/2020 31 (L) 34 - 58 % Final   12/26/2019 32 (L) 34 - 58 % Final 10/10/2019 33 (L) 34 - 58 % Final   09/01/2017 27 (L) 34 - 58 % Final   08/01/2014 35 34 - 58 % Final   01/03/2014 34 34 - 58 % Final   11/23/2012 31 (L) 34 - 58 % Final   07/20/2012 29 (L) 34 - 58 % OF LYMPHS Final     HIV RNA Quant Result   Date Value Ref Range Status   01/29/2020 Not Detected Not Detected Final   12/26/2019 Detected (A) Not Detected Final   10/10/2019 Not Detected Not Detected Final   06/08/2018 Not Detected Not Detected Final   08/15/2014 Not Detected  Final   01/03/2014 Not Detected  Final   11/23/2012 Not Detected  Final   07/20/2012 Not Detected  Final     HIV RNA   Date Value Ref Range Status   05/09/2020 <40 copies/mL Final     Comment:     HIV-1 RNA not detected  The reportable range for this assay is 40 to 10,000,000  copies HIV-1 RNA/mL.     12/26/2019 <40 (H) <0 copies/mL Final   05/16/2019 <40 copies/mL Final     Comment:     HIV-1 RNA not detected  The reportable range for this assay is 40 to 10,000,000  copies HIV-1 RNA/mL.     09/12/2018 <40 copies/mL Final     Comment:     HIV-1 RNA not detected  The reportable range for this assay is 40 to 10,000,000  copies HIV-1 RNA/mL.     12/01/2017 <40 (H) <0 copies/mL Final   09/01/2017 <40 (H) <0 copies/mL Final   04/02/2017 <40 (H) <0 copies/mL Final     HIV RNA Log(10)   Date Value Ref Range Status   05/09/2020 CANCELED log10copy/mL      Comment:     Unable to calculate result since non-numeric result obtained for  component test.    Result canceled by the ancillary.     12/26/2019   Final     Comment:     <1.6 log   05/16/2019 CANCELED log10copy/mL      Comment:     Unable to calculate result since non-numeric result obtained for  component test.    Result canceled by the ancillary.     09/12/2018 CANCELED log10copy/mL      Comment:     Unable to calculate result since non-numeric  result obtained for  component test.    Result canceled by the ancillary.     12/01/2017  <0.00 log copies/mL Final     Comment:     <1.6 log   09/01/2017 <0.00 log copies/mL Final     Comment:     <1.6 log   04/02/2017  <0.00 log copies/mL Final     Comment:     <1.6 log         Absolute CD4 Count   Date Value Ref Range Status   01/29/2020 649 510-2,320 /uL Final   12/26/2019 736 510-2,320 /uL Final   10/10/2019 693 510-2,320 /uL Final   09/01/2017 417 (L) 510-2,320 /uL Final   08/01/2014 514 510 - 2,320 /uL Final   01/03/2014 430 (L) 510 - 2,320 /uL Final   11/23/2012 549 510 - 2,320 /uL Final   07/20/2012 470 (L) 510 - 2,320 CELLS/UL Final     HIV RNA   Date Value Ref Range Status   05/09/2020 <40 copies/mL Final     Comment:     HIV-1 RNA not detected  The reportable range for this assay is 40 to 10,000,000  copies HIV-1 RNA/mL.     12/26/2019 <40 (H) <0 copies/mL Final   05/16/2019 <40 copies/mL Final     Comment:     HIV-1 RNA not detected  The reportable range for this assay is 40 to 10,000,000  copies HIV-1 RNA/mL.     09/12/2018 <40 copies/mL Final     Comment:     HIV-1 RNA not detected  The reportable range for this assay is 40 to 10,000,000  copies HIV-1 RNA/mL.     12/01/2017 <40 (H) <0 copies/mL Final   09/01/2017 <40 (H) <0 copies/mL Final   04/02/2017 <40 (H) <0 copies/mL Final      Lab Results   Component Value Date    WBC 8.6 05/09/2020    HGB 15.6 05/09/2020    Platelet 148 (L) 05/09/2020    Creatinine 2.53 (H) 05/09/2020    AST 56 (H) 05/09/2020    ALT 101 (H) 05/09/2020    Triglycerides 188 (H) 01/29/2020    Triglycerides 80 01/03/2014    HDL 23 (L) 01/29/2020    HDL 53 01/03/2014    LDL Calculated 27 (L) 01/29/2020    LDL Cholesterol, Calculated 71 01/03/2014       Immunization History   Administered Date(s) Administered   ??? COVID-19 VACCINE,MRNA(MODERNA)(PF)(IM) 08/12/2019, 03/27/2020   ??? HEPB-CPG,ADULT DOSAGE (2 DOSE SCHEDULE)ADJUVANTED, IM 01/31/2020   ??? Hepatitis B, Adult 04/06/2016, 05/21/2016, 08/24/2016   ??? INFLUENZA INJ MDCK PF, QUAD,(FLUCELVAX)(74MO AND UP EGG FREE) 02/28/2019   ??? INFLUENZA TIV (TRI) PF (IM) 02/08/2008, 03/26/2010, 03/25/2011   ??? Influenza Vaccine Quad (IIV4 PF) 7mo+ injectable 07/24/2015, 02/05/2016, 03/08/2018, 03/05/2020   ??? Influenza Virus Vaccine, unspecified formulation 06/15/2014, 07/24/2015, 02/05/2016, 03/15/2016, 03/31/2017   ??? PNEUMOCOCCAL POLYSACCHARIDE 23 07/13/2000, 08/19/2005, 12/01/2017   ??? PPD Test 12/29/2006, 07/11/2008, 03/26/2010, 03/25/2011   ??? Pneumococcal Conjugate 13-Valent 04/13/2012   ??? SHINGRIX-ZOSTER VACCINE (HZV), RECOMBINANT,SUB-UNIT,ADJUVANTED IM 09/16/2016, 12/01/2017   ??? TdaP 08/31/2007, 08/04/2010, 10/27/2018   ??? Tuberculin Skin Test;unspecified Formulation 12/29/2006, 07/11/2008, 03/26/2010, 03/25/2011

## 2020-08-07 NOTE — Unmapped (Addendum)
When [place] arrives, please start Biktarvy 1 tab daily and STOP Descovy and Tivicay  Patient Education        bictegravir, emtricitabine, and tenofovir  Pronunciation:  bik TEG ra vir, EM trye SYE ta been, and ten OF oh vir  Brand:  Biktarvy  What is the most important information I should know about this medicine?  If you've ever had hepatitis B, it may become active or get worse after you stop using this medicine. You may need frequent liver function tests for several months.  What is bictegravir, emtricitabine, and tenofovir (Biktarvy)?  Susanne Borders is an antiviral medicine that prevents human immunodeficiency virus (HIV) from multiplying in your body.  Susanne Borders is used to treat HIV, the virus that can cause acquired immunodeficiency syndrome (AIDS). This medicine is not a cure for HIV or AIDS.  Susanne Borders is for use in adults and children who weigh at least 55 pounds (25 kilograms).  Susanne Borders may also be used for purposes not listed in this medication guide.  What should I discuss with my healthcare provider before taking Biktarvy?  Some medicines can cause unwanted or dangerous effects when used with Biktarvy. Your doctor may need to change your treatment plan if you also use:  ?? dofetilide; or  ?? rifampin.  Tell your doctor if you have ever had:  ?? liver disease (especially cirrhosis or hepatitis B); or  ?? kidney disease.  Tell your doctor if you are pregnant. HIV can be passed to your baby if you are not properly treated during pregnancy. Take all of your HIV medicines to control your infection. Your name may be listed on a pregnancy registry to track the effects of Biktarvy on the baby.  Women with HIV or AIDS should not breast feed a baby. Even if your baby is born without HIV, the virus may be passed to the baby in your breast milk.  How should I take this medicine?  Follow all directions on your prescription label and read all medication guides or instruction sheets. Use the medicine exactly as directed.  Susanne Borders is a complete regimen and is not for use with other antiviral medications.  You may take Biktarvy with or without food. If you take a multivitamin or mineral supplement that contains iron or calcium, take it with food at the same time you take Biktarvy.  You will need frequent medical tests.  Store in the original container at room temperature, away from moisture and heat. Keep the bottle tightly closed when not in use.  If you've ever had hepatitis B, this virus may become active or get worse in the months after you stop using Biktarvy. You may need frequent liver function tests while using this medicine and for several months after your last dose.  What happens if I miss a dose?  Use the medicine as soon as you can, but skip the missed dose if it is almost time for your next dose. Do not use two doses at one time.  Get your prescription refilled before you run out of medicine completely.  What happens if I overdose?  Seek emergency medical attention or call the Poison Help line at [phone].  What should I avoid while taking this medicine?  Follow your doctor's instructions about any restrictions on food, beverages, or activity.  What are the possible side effects of this medicine?  Get emergency medical help if you have signs of an allergic reaction: hives; difficult breathing; swelling of your face, lips, tongue, or throat.  Call your doctor at once if you have:  ?? kidney problems --little or no urination, swelling in your feet or ankles, feeling tired or short of breath;  ?? lactic acidosis --muscle pain or weakness, numbness or cold feeling, trouble breathing, stomach pain, vomiting, irregular heart rate, dizziness, or feeling very weak or tired; or  ?? liver problems --swelling around your midsection, upper stomach pain, unusual tiredness, loss of appetite, dark urine, clay-colored stools, jaundice (yellowing of the skin or eyes).  Biktarvy affects your immune system, which may cause certain side effects (even weeks or months after you've taken this medicine). Tell your doctor if you have:  ?? signs of a new infection --fever, night sweats, swollen glands, cold sores, cough, wheezing, diarrhea, weight loss;  ?? trouble speaking or swallowing, problems with balance or eye movement, weakness or prickly feeling; or  ?? swelling in your neck or throat (enlarged thyroid), menstrual changes, impotence.  Common side effects may include:  ?? nausea, diarrhea; or  ?? headache.  This is not a complete list of side effects and others may occur. Call your doctor for medical advice about side effects. You may report side effects to FDA at 1-800-FDA-1088.  What other drugs will affect this medicine?  Sometimes it is not safe to use certain medications at the same time. Some drugs can affect your blood levels of other drugs you take, which may increase side effects or make the medications less effective.  Certain antacids, laxatives, or buffered medicines can make Biktarvy much less effective when taken at the same time. Take your Biktarvy dose on an empty stomach, 2 hours before you take any of these other medicines.  Many drugs can affect Biktarvy. This includes prescription and over-the-counter medicines, vitamins, and herbal products. Not all possible interactions are listed here. Tell your doctor about all your current medicines and any medicine you start or stop using.  Where can I get more information?  Your pharmacist can provide more information about bictegravir, emtricitabine, and tenofovir.  Remember, keep this and all other medicines out of the reach of children, never share your medicines with others, and use this medication only for the indication prescribed.   Every effort has been made to ensure that the information provided by [employer] ('[employer]') is accurate, up-to-date, and complete, but no guarantee is made to that effect. Drug information contained herein may be time sensitive. [employer] information has been compiled for use by healthcare practitioners and consumers in the [place] and therefore [employer] does not warrant that uses outside of the [place] are appropriate, unless specifically indicated otherwise. [employer]'s drug information does not endorse drugs, diagnose patients or recommend therapy. [employer]'s drug information is an [employer], corporate to assist licensed healthcare practitioners in caring for their patients and/or to serve consumers viewing this service as a supplement to, and not a substitute for, the expertise, skill, knowledge and judgment of healthcare practitioners. The absence of a warning for a given drug or drug combination in no way should be construed to indicate that the drug or drug combination is safe, effective or appropriate for any given patient. [employer] does not assume any responsibility for any aspect of healthcare administered with the aid of information [employer] provides. The information contained herein is not intended to cover all possible uses, directions, precautions, warnings, drug interactions, allergic reactions, or adverse effects. If you have questions about the drugs you are taking, check with your doctor, nurse or pharmacist.  Copyright [phone] [employer] Version: 2.01. Revision date: 02/14/2018.  Care instructions adapted under license by [facility]. If you have questions about a medical condition or this instruction, always ask your healthcare professional. [employer] disclaims any warranty or liability for your use of this information.         Patient Education        bictegravir, emtricitabine, and tenofovir  Pronunciation:  bik TEG ra vir, EM trye SYE ta been, and ten OF oh vir  Brand:  Biktarvy  What is the most important information I should know about this medicine?  If you've ever had hepatitis B, it may become active or get worse after you stop using this medicine. You may need frequent liver function tests for several months.  What is bictegravir, emtricitabine, and tenofovir (Biktarvy)?  Susanne Borders is an antiviral medicine that prevents human immunodeficiency virus (HIV) from multiplying in your body.  Susanne Borders is used to treat HIV, the virus that can cause acquired immunodeficiency syndrome (AIDS). This medicine is not a cure for HIV or AIDS.  Susanne Borders is for use in adults and children who weigh at least 55 pounds (25 kilograms).  Susanne Borders may also be used for purposes not listed in this medication guide.  What should I discuss with my healthcare provider before taking Biktarvy?  Some medicines can cause unwanted or dangerous effects when used with Biktarvy. Your doctor may need to change your treatment plan if you also use:  ?? dofetilide; or  ?? rifampin.  Tell your doctor if you have ever had:  ?? liver disease (especially cirrhosis or hepatitis B); or  ?? kidney disease.  Tell your doctor if you are pregnant. HIV can be passed to your baby if you are not properly treated during pregnancy. Take all of your HIV medicines to control your infection. Your name may be listed on a pregnancy registry to track the effects of Biktarvy on the baby.  Women with HIV or AIDS should not breast feed a baby. Even if your baby is born without HIV, the virus may be passed to the baby in your breast milk.  How should I take this medicine?  Follow all directions on your prescription label and read all medication guides or instruction sheets. Use the medicine exactly as directed.  Susanne Borders is a complete regimen and is not for use with other antiviral medications.  You may take Biktarvy with or without food. If you take a multivitamin or mineral supplement that contains iron or calcium, take it with food at the same time you take Biktarvy.  You will need frequent medical tests.  Store in the original container at room temperature, away from moisture and heat. Keep the bottle tightly closed when not in use.  If you've ever had hepatitis B, this virus may become active or get worse in the months after you stop using Biktarvy. You may need frequent liver function tests while using this medicine and for several months after your last dose.  What happens if I miss a dose?  Use the medicine as soon as you can, but skip the missed dose if it is almost time for your next dose. Do not use two doses at one time.  Get your prescription refilled before you run out of medicine completely.  What happens if I overdose?  Seek emergency medical attention or call the Poison Help line at [phone].  What should I avoid while taking this medicine?  Follow your doctor's instructions about any restrictions on  food, beverages, or activity.  What are the possible side effects of this medicine?  Get emergency medical help if you have signs of an allergic reaction: hives; difficult breathing; swelling of your face, lips, tongue, or throat.  Call your doctor at once if you have:  ?? kidney problems --little or no urination, swelling in your feet or ankles, feeling tired or short of breath;  ?? lactic acidosis --muscle pain or weakness, numbness or cold feeling, trouble breathing, stomach pain, vomiting, irregular heart rate, dizziness, or feeling very weak or tired; or  ?? liver problems --swelling around your midsection, upper stomach pain, unusual tiredness, loss of appetite, dark urine, clay-colored stools, jaundice (yellowing of the skin or eyes).  Biktarvy affects your immune system, which may cause certain side effects (even weeks or months after you've taken this medicine). Tell your doctor if you have:  ?? signs of a new infection --fever, night sweats, swollen glands, cold sores, cough, wheezing, diarrhea, weight loss;  ?? trouble speaking or swallowing, problems with balance or eye movement, weakness or prickly feeling; or  ?? swelling in your neck or throat (enlarged thyroid), menstrual changes, impotence.  Common side effects may include:  ?? nausea, diarrhea; or  ?? headache.  This is not a complete list of side effects and others may occur. Call your doctor for medical advice about side effects. You may report side effects to FDA at 1-800-FDA-1088.  What other drugs will affect this medicine?  Sometimes it is not safe to use certain medications at the same time. Some drugs can affect your blood levels of other drugs you take, which may increase side effects or make the medications less effective.  Certain antacids, laxatives, or buffered medicines can make Biktarvy much less effective when taken at the same time. Take your Biktarvy dose on an empty stomach, 2 hours before you take any of these other medicines.  Many drugs can affect Biktarvy. This includes prescription and over-the-counter medicines, vitamins, and herbal products. Not all possible interactions are listed here. Tell your doctor about all your current medicines and any medicine you start or stop using.  Where can I get more information?  Your pharmacist can provide more information about bictegravir, emtricitabine, and tenofovir.  Remember, keep this and all other medicines out of the reach of children, never share your medicines with others, and use this medication only for the indication prescribed.   Every effort has been made to ensure that the information provided by [employer]') is accurate, up-to-date, and complete, but no guarantee is made to that effect. Drug information contained herein may be time sensitive. [employer] information has been compiled for use by healthcare practitioners and consumers in the [place] and therefore [employer] does not warrant that uses outside of the [place] are appropriate, unless specifically indicated otherwise. [employer]'s drug information does not endorse drugs, diagnose patients or recommend therapy. [employer]'s drug information is an [employer], corporate to assist licensed healthcare practitioners in caring for their patients and/or to serve consumers viewing this service as a supplement to, and not a substitute for, the expertise, skill, knowledge and judgment of healthcare practitioners. The absence of a warning for a given drug or drug combination in no way should be construed to indicate that the drug or drug combination is safe, effective or appropriate for any given patient. [employer] does not assume any responsibility for any aspect of healthcare administered with the aid of information [employer] provides. The information contained herein  is not intended to cover all possible uses, directions, precautions, warnings, drug interactions, allergic reactions, or adverse effects. If you have questions about the drugs you are taking, check with your doctor, nurse or pharmacist.  Copyright [id number] [employer] Version: 2.01. Revision date: 02/14/2018.  Care instructions adapted under license by [facility]. If you have questions about a medical condition or this instruction, always ask your healthcare professional. [employer] disclaims any warranty or liability for your use of this information.

## 2020-08-07 NOTE — Unmapped (Signed)
error 

## 2020-08-07 NOTE — Unmapped (Signed)
MEDICATION COUNSELING     Contacted patient to discuss ART switch to new medication, Biktarvy. Patient was previously taking Tivicay + Descovy.     Reviewed with patient that Susanne Borders should be taken once daily at the same time each day with or without food. Patient stated he will take Biktarvy every evening.  Informed patient that Susanne Borders should be separated by 2 hours before or 6 hours after supplements that contain calcium, magnesium, or iron. Alternatively Biktarvy can be taken at the same time as these supplements if taken with a meal.     Reviewed common adverse drug reactions with patient including headache, nausea, and stomach upset. Informed patient that these side effects are mild and self-limiting, and tend to improve 1-2 weeks after starting Biktarvy. Informed patient to contact the ID clinic or get medical help right away if he experiences any of these rare side effects: s/sx of allergic reaction (rash, hives, SOB, etc) or inability to pass urine. Patient verbalized understanding to the following and had no further questions.     Time spent: 15 minutes    Danton Clap, PharmD, BCIDP, [name]  Clinical Pharmacist Practitioner - HIV/Infectious Diseases    [facility]  [address]  [place], [place]  [address]  P [phone]  F [phone]

## 2020-08-08 NOTE — Unmapped (Signed)
[facility] Specialty Medication Onboarding    Specialty Medication: Radio producer  Prior Authorization: Not Required   Financial Assistance: Yes - grant approved as secondary   Final Copay/Day Supply: $0 / 30    Insurance Restrictions: None     Notes to Pharmacist:       The triage team has completed the benefits investigation and has determined that the patient is able to fill this medication at [facility]. Please contact the patient to complete the onboarding or follow up with the prescribing physician as needed.

## 2020-08-09 NOTE — Unmapped (Addendum)
I reviewed this patient case and all documentation provided by the learner and was readily available for consultation during their interaction with the patient.  I agree with the assessment and plan listed below.    Roderic Palau   [facility] Specialty Pharmacist      [facility]   Patient Onboarding/Medication Counseling    Samuel [name] is a 70 y.o. male with HIV who I am counseling today on initiation of therapy.  I am speaking to the patient.    Was a Nurse, learning disability used for this call? No    Verified patient's date of birth / HIPAA.    Specialty medication(s) to be sent: Infectious Disease: Biktarvy      Non-specialty medications/supplies to be sent: n/a      Medications not needed at this time: n/a         Biktarvy (bictegravir, emtricitabine, and tenofovir alafenamide)    Medication & Administration     Dosage: Take 1 tablet by mouth daily    Administration: Take without regard to food    Adherence/Missed dose instructions: take missed dose as soon as you remember. If it is close to the time of your next dose, skip the dose and resume with your next scheduled dose.    Goals of Therapy     To suppress viral replication and keep patient's HIV undetectable by lab tests    Side Effects & Monitoring Parameters     Common Side Effects:  ??? Diarrhea  ??? Upset stomach  ??? Headache  ??? Changes in Weight  ??? Changes in mood    The following side effects should be reported to the provider:     ?? If patient experiences: signs of an allergic reaction (rash; hives; itching; red, swollen, blistered, or peeling skin with or without fever; wheezing; tightness in the chest or throat; trouble breathing, swallowing, or talking; unusual hoarseness; or swelling of the mouth, face, lips, tongue, or throat)  ?? signs of kidney problems (unable to pass urine, change in how much urine is passed, blood in the urine, or a big weight gain)  ?? signs of liver problems (dark urine, feeling tired, not hungry, upset stomach or stomach pain, light-colored stools, throwing up, or yellow skin or eyes)  ?? signs of lactic acidosis (fast breathing, fast heartbeat, a heartbeat that does not feel normal, very bad upset stomach or throwing up, feeling very sleepy, shortness of breath, feeling very tired or weak, very bad dizziness, feeling cold, or muscle pain or cramps)  Weight gain: some patients have reported weight gain after starting this medication. The amount of weight can vary.    Monitoring Parameters:  ?? CD4  Count  ?? HIV RNA plasma levels,  ?? Liver function  ?? Total bilirubin  ?? serum creatinine  ?? urine glucose  ?? urine protein (prior to or when initiating therapy and as clinically indicated during therapy);       Drug/Food Interactions     ??? Medication list reviewed in Epic. The patient was instructed to inform the care team before taking any new medications or supplements. No drug interactions identified.   ??? Calcium Salts: May decrease the serum concentration of Biktarvy. If taken with food, Biktarvy can be administered with calcium salts.   ??? Iron Preparations: May decrease the serum concentration of Biktarvy. If taken with food, Biktarvy can be administered with Ferrous sulfate. If taken on an empty stomach, Biktarvy must be taken 2 hours  before ferrous sulfate. Avoid other iron salts.    Contraindications, Warnings, & Precautions     ??? Black Box Warning: Severe acute exacertbations of HBV have been reported in patients coinfected with HIV-1 and HBV fllowing discontinuation of therapy  ??? Coadministration with dofetilide, rifampin is contraindicated  ??? Immune reconstitution syndrome: Patients may develop immune reconstitution syndrome, resulting in the occurrence of an inflammatory response to an indolent or residual opportunistic infection or activation of autoimmune disorders (eg, Graves disease, polymyositis, Guillain-Barr?? syndrome, autoimmune hepatitis)   ??? Lactic acidosis/hepatomegaly  ??? Renal toxicity: patients with preexisting renal impairment and those taking nephrotoxic agents (including NSAIDs) are at increased risk.     Storage, Handling Precautions, & Disposal     ??? Store in the original container at room temperature.   ??? Keep lid tightly closed.   ??? Store in a dry place. Do not store in a bathroom.   ??? Keep all drugs in a safe place. Keep all drugs out of the reach of children and pets.   ??? Throw away unused or expired drugs. Do not flush down a toilet or pour down a drain unless you are told to do so. Check with your pharmacist if you have questions about the best way to throw out drugs. There may be drug take-back programs in your area.      Current Medications (including OTC/herbals), Comorbidities and Allergies     Current Outpatient Medications   Medication Sig Dispense Refill   ??? albuterol (PROVENTIL HFA;VENTOLIN HFA) 90 mcg/actuation inhaler Inhale 2 puffs. Take as needed     ??? allopurinoL (ZYLOPRIM) 100 MG tablet Take 250 mg by mouth daily.      ??? amLODIPine (NORVASC) 10 MG tablet Take 1 tablet (10 mg total) by mouth daily. 90 tablet 3   ??? aspirin (ECOTRIN) 81 MG tablet daily.   0   ??? atenoloL (TENORMIN) 100 MG tablet Take 1 tablet (100 mg total) by mouth daily. 90 tablet 3   ??? atorvastatin (LIPITOR) 40 MG tablet  (Patient not taking: Reported on 08/07/2020)     ??? BACLOFEN, BULK, TOP Apply topically. Baclofen 2%, Diclofenac 5%, Gabapentin 6%, Tetracaine 3%    Take as needed     ??? bictegrav-emtricit-tenofov ala (BIKTARVY) 50-200-25 mg tablet Take 1 tablet by mouth daily. 30 tablet 11   ??? blood sugar diagnostic (ONETOUCH VERIO TEST STRIPS) Strp Use 2 (two) times daily E11.22     ??? calcium carbonate-vitamin D2 500 mg(1,250mg ) -200 unit tablet Take 1 tablet by mouth Two (2) times a day.      ??? carvediloL (COREG) 12.5 MG tablet 12.5 mg Two (2) times a day.      ??? cholecalciferol, vitamin D3 25 mcg, 1,000 units,, 1,000 unit (25 mcg) tablet Take by mouth daily.     ??? cloNIDine HCL (CATAPRES) 0.1 MG tablet Take 1 tablet (0.1 mg total) by mouth 3 (three) times a day. 270 tablet 3   ??? diazepam (VALIUM) 5 MG tablet Take 5 mg by mouth nightly.      ??? dicyclomine (BENTYL) 10 mg capsule Take 10 mg by mouth two (2) times a day as needed.      ??? docusate sodium (COLACE) 100 MG capsule Take 1 capsule (100 mg total) by mouth every twelve (12) hours. (Patient taking differently: Take 100 mg by mouth every twelve (12) hours. Take as needed) 60 capsule 0   ??? dorzolamide-timoloL (COSOPT) 22.3-6.8 mg/mL ophthalmic solution Administer 1 drop to both  eyes Two (2) times a day.      ??? doxazosin (CARDURA) 4 MG tablet Take 1 tablet (4 mg total) by mouth nightly. 90 tablet 3   ??? ferrous sulfate 325 (65 FE) MG tablet Take 325 mg by mouth daily with breakfast.      ??? folic acid (FOLVITE) 1 MG tablet   0   ??? gabapentin (NEURONTIN) 300 MG capsule 300 mg Two (2) times a day.      ??? glimepiride (AMARYL) 4 MG tablet Take 1 tablet (4 mg total) by mouth daily. 90 tablet 3   ??? hydrALAZINE (APRESOLINE) 100 MG tablet Take 1 tablet (100 mg total) by mouth Two (2) times a day. 180 tablet 3   ??? HYDROcodone-acetaminophen (NORCO) 5-325 mg per tablet Directions are take one tablet 30 minutes before physical therapy, then take one tablet at bedtime as needed for pain     ??? lisinopriL (PRINIVIL,ZESTRIL) 40 MG tablet Take 1 tablet (40 mg total) by mouth daily. 90 tablet 3   ??? meTOLAzone (ZAROXOLYN) 10 MG tablet Take 1 tablet (10 mg total) by mouth daily. 90 tablet 3   ??? metroNIDAZOLE (FLAGYL) 500 MG tablet Take 500 mg by mouth. (Patient not taking: Reported on 08/07/2020)     ??? nitroglycerin (NITROSTAT) 0.4 MG SL tablet DISSOLVE ONE TABLET UNDER TONGUE AS NEEDED FOR CHEST PAIN EVERY 5 MINUTES AS DIRECTED 25 tablet 0   ??? nystatin (MYCOSTATIN) 100,000 unit/mL suspension Take 5 mL by mouth daily.      ??? ondansetron (ZOFRAN) 4 MG tablet take 1 tablet by mouth every 8 hours if needed for nausea     ??? oxyCODONE-acetaminophen (PERCOCET) 5-325 mg per tablet Take 1 tablet by mouth daily.      ??? ROCKLATAN 0.02-0.005 % Drop INT 1 GTT INTO OU QHS     ??? sertraline (ZOLOFT) 50 MG tablet take 1 tablet by mouth once daily     ??? SITagliptin (JANUVIA) 50 MG tablet Take 1 tablet (50 mg total) by mouth daily. 90 tablet 3   ??? spironolactone (ALDACTONE) 25 MG tablet Take 1 tablet (25 mg total) by mouth daily. 90 tablet 3   ??? tamsulosin (FLOMAX) 0.4 mg capsule TAKE 1 CAPSULE(0.4 MG) BY MOUTH DAILY (Patient not taking: Reported on 08/07/2020) 90 capsule 3   ??? torsemide (DEMADEX) 20 MG tablet Take 2 tablets (40 mg total) by mouth daily. 90 tablet 3     No current facility-administered medications for this visit.       Allergies   Allergen Reactions   ??? Colchicine Analogues Diarrhea     Have diarrhea when taken for long periods of time   ??? Tramadol Other (See Comments)     unknown tolerates morphine       Patient Active Problem List   Diagnosis   ??? Human immunodeficiency virus (HIV) disease (CMS-HCC)   ??? HCV (hepatitis C virus)   ??? Hypertension   ??? Gout   ??? Biological false positive RPR test   ??? Chronic kidney disease (CKD) stage G4/A2, severely decreased glomerular filtration rate (GFR) between 15-29 mL/min/1.73 square meter and albuminuria creatinine ratio between 30-299 mg/g (CMS-HCC)   ??? Renal mass   ??? NSTEMI (non-ST elevated myocardial infarction) (CMS-HCC)   ??? Neurological deficit present   ??? HIV (human immunodeficiency virus infection) (CMS-HCC)   ??? Cirrhosis (CMS-HCC)   ??? Abnormal EKG   ??? Acute encephalopathy   ??? Acute on chronic diastolic CHF (congestive heart failure) (CMS-HCC)   ???  Anemia   ??? Bradycardia   ??? CAD in native artery   ??? Chest pain   ??? Chronic hepatitis C without hepatic coma (CMS-HCC)       Reviewed and up to date in Epic.    Appropriateness of Therapy     Is medication and dose appropriate based on diagnosis? Yes    Prescription has been clinically reviewed: Yes    Baseline Quality of Life Assessment      How many days over the past month did your HIV  keep you from your normal activities? For example, brushing your teeth or getting up in the morning. 0    Financial Information     Medication Assistance provided: [facility] Assistance    Anticipated copay of $0.00 reviewed with patient. Verified delivery address.    Delivery Information     Scheduled delivery date: 08/09/2020    Expected start date: 08/09/2020    Medication will be delivered via Same Day Courier to the prescription address in [facility].  This shipment will not require a signature.      Explained the services we provide at [facility] and that each month we would call to set up refills.  Stressed importance of returning phone calls so that we could ensure they receive their medications in time each month.  Informed patient that we should be setting up refills 7-10 days prior to when they will run out of medication.  A pharmacist will reach out to perform a clinical assessment periodically.  Informed patient that a welcome packet, containing information about our pharmacy and other support services, a Notice of Privacy Practices, and a drug information handout will be sent.      Patient verbalized understanding of the above information as well as how to contact the pharmacy at [phone] option 4 with any questions/concerns.  The pharmacy is open Monday through Friday 8:30am-4:30pm.  A pharmacist is available 24/7 via pager to answer any clinical questions they may have.    Patient Specific Needs     - Does the patient have any physical, cognitive, or cultural barriers? No    - Patient prefers to have medications discussed with  Patient     - Is the patient or caregiver able to read and understand education materials at a high school level or above? Yes    - Patient's primary language is  English     - Is the patient high risk? No    - Does the patient require a Care Management Plan? No     - Does the patient require physician intervention or other additional services (i.e. nutrition, smoking cessation, social work)? No      Samuel [name], Samuel [name], Samuel [name]  PGY1 Community-based Pharmacy Resident

## 2020-08-12 DIAGNOSIS — N184 Chronic kidney disease, stage 4 (severe): Principal | ICD-10-CM

## 2020-08-26 DIAGNOSIS — N184 Chronic kidney disease, stage 4 (severe): Principal | ICD-10-CM

## 2020-08-26 NOTE — Unmapped (Signed)
I reviewed this patient case and all documentation provided by the learner and was readily available for consultation during their interaction with the patient.  I agree with the assessment and plan listed below.    Roderic Palau   [facility] Specialty Pharmacist    [facility] Clinical Assessment & Refill Coordination Note    Samuel [name], [place]: 06/26/1950  Phone: [phone] (home)     All above HIPAA information was verified with patient.     Was a Nurse, learning disability used for this call? No    Specialty Medication(s):   Infectious Disease: Biktarvy     Current Outpatient Medications   Medication Sig Dispense Refill   ??? albuterol (PROVENTIL HFA;VENTOLIN HFA) 90 mcg/actuation inhaler Inhale 2 puffs. Take as needed     ??? allopurinoL (ZYLOPRIM) 100 MG tablet Take 250 mg by mouth daily.      ??? amLODIPine (NORVASC) 10 MG tablet Take 1 tablet (10 mg total) by mouth daily. 90 tablet 3   ??? aspirin (ECOTRIN) 81 MG tablet daily.   0   ??? atenoloL (TENORMIN) 100 MG tablet Take 1 tablet (100 mg total) by mouth daily. 90 tablet 3   ??? atorvastatin (LIPITOR) 40 MG tablet  (Patient not taking: Reported on 08/07/2020)     ??? BACLOFEN, BULK, TOP Apply topically. Baclofen 2%, Diclofenac 5%, Gabapentin 6%, Tetracaine 3%    Take as needed     ??? bictegrav-emtricit-tenofov ala (BIKTARVY) 50-200-25 mg tablet Take 1 tablet by mouth daily. 30 tablet 11   ??? blood sugar diagnostic (ONETOUCH VERIO TEST STRIPS) Strp Use 2 (two) times daily E11.22     ??? calcium carbonate-vitamin D2 500 mg(1,250mg ) -200 unit tablet Take 1 tablet by mouth Two (2) times a day.      ??? carvediloL (COREG) 12.5 MG tablet 12.5 mg Two (2) times a day.      ??? cholecalciferol, vitamin D3 25 mcg, 1,000 units,, 1,000 unit (25 mcg) tablet Take by mouth daily.     ??? cloNIDine HCL (CATAPRES) 0.1 MG tablet Take 1 tablet (0.1 mg total) by mouth 3 (three) times a day. 270 tablet 3   ??? diazepam (VALIUM) 5 MG tablet Take 5 mg by mouth nightly.      ??? dicyclomine (BENTYL) 10 mg capsule Take 10 mg by mouth two (2) times a day as needed.      ??? docusate sodium (COLACE) 100 MG capsule Take 1 capsule (100 mg total) by mouth every twelve (12) hours. (Patient taking differently: Take 100 mg by mouth every twelve (12) hours. Take as needed) 60 capsule 0   ??? dorzolamide-timoloL (COSOPT) 22.3-6.8 mg/mL ophthalmic solution Administer 1 drop to both eyes Two (2) times a day.      ??? doxazosin (CARDURA) 4 MG tablet Take 1 tablet (4 mg total) by mouth nightly. 90 tablet 3   ??? ferrous sulfate 325 (65 FE) MG tablet Take 325 mg by mouth daily with breakfast.      ??? folic acid (FOLVITE) 1 MG tablet   0   ??? gabapentin (NEURONTIN) 300 MG capsule 300 mg Two (2) times a day.      ??? glimepiride (AMARYL) 4 MG tablet Take 1 tablet (4 mg total) by mouth daily. 90 tablet 3   ??? hydrALAZINE (APRESOLINE) 100 MG tablet Take 1 tablet (100 mg total) by mouth Two (2) times a day. 180 tablet 3   ??? HYDROcodone-acetaminophen (NORCO) 5-325 mg per  tablet Directions are take one tablet 30 minutes before physical therapy, then take one tablet at bedtime as needed for pain     ??? lisinopriL (PRINIVIL,ZESTRIL) 40 MG tablet Take 1 tablet (40 mg total) by mouth daily. 90 tablet 3   ??? meTOLAzone (ZAROXOLYN) 10 MG tablet Take 1 tablet (10 mg total) by mouth daily. 90 tablet 3   ??? metroNIDAZOLE (FLAGYL) 500 MG tablet Take 500 mg by mouth. (Patient not taking: Reported on 08/07/2020)     ??? nitroglycerin (NITROSTAT) 0.4 MG SL tablet DISSOLVE ONE TABLET UNDER TONGUE AS NEEDED FOR CHEST PAIN EVERY 5 MINUTES AS DIRECTED 25 tablet 0   ??? nystatin (MYCOSTATIN) 100,000 unit/mL suspension Take 5 mL by mouth daily.      ??? ondansetron (ZOFRAN) 4 MG tablet take 1 tablet by mouth every 8 hours if needed for nausea     ??? oxyCODONE-acetaminophen (PERCOCET) 5-325 mg per tablet Take 1 tablet by mouth daily.      ??? ROCKLATAN 0.02-0.005 % Drop INT 1 GTT INTO OU QHS     ??? sertraline (ZOLOFT) 50 MG tablet take 1 tablet by mouth once daily     ??? SITagliptin (JANUVIA) 50 MG tablet Take 1 tablet (50 mg total) by mouth daily. 90 tablet 3   ??? spironolactone (ALDACTONE) 25 MG tablet Take 1 tablet (25 mg total) by mouth daily. 90 tablet 3   ??? tamsulosin (FLOMAX) 0.4 mg capsule TAKE 1 CAPSULE(0.4 MG) BY MOUTH DAILY (Patient not taking: Reported on 08/07/2020) 90 capsule 3   ??? torsemide (DEMADEX) 20 MG tablet Take 2 tablets (40 mg total) by mouth daily. 90 tablet 3     No current facility-administered medications for this visit.        Changes to medications: Kacen reports no changes at this time.    Allergies   Allergen Reactions   ??? Colchicine Analogues Diarrhea     Have diarrhea when taken for long periods of time   ??? Tramadol Other (See Comments)     unknown tolerates morphine       Changes to allergies: No    SPECIALTY MEDICATION ADHERENCE     Biktarvy 50-200-25 mg (bictegravir-emitricitabine-tenofovir alafenamide): 10 days of medicine on hand       Medication Adherence    Patient reported X missed doses in the last month: 0  Specialty Medication: Biktarvy  Patient is on additional specialty medications: No  Any gaps in refill history greater than 2 weeks in the last 3 months: no  Demonstrates understanding of importance of adherence: yes  Informant: patient  Reliability of informant: reliable  Provider-estimated medication adherence level: good  Patient is at risk for Non-Adherence: No  Reasons for non-adherence: no problems identified          Specialty medication(s) dose(s) confirmed: Regimen is correct and unchanged.     Are there any concerns with adherence? No    Adherence counseling provided? Not needed    CLINICAL MANAGEMENT AND INTERVENTION      Clinical Benefit Assessment:    Do you feel the medicine is effective or helping your condition? Yes    Clinical Benefit counseling provided? Not needed    Adverse Effects Assessment:    Are you experiencing any side effects? Yes, patient reports experiencing a headache occasionally. Patient reports taking Tylenol and it helps but is unsure of the strength. . Side effect counseling provided: Yes    Are you experiencing difficulty administering your medicine? No  Quality of Life Assessment:    How many days over the past month did your HIV  keep you from your normal activities? For example, brushing your teeth or getting up in the morning. 0    Have you discussed this with your provider? Not needed    Acute Infection Status:    Acute infections noted within Epic:  No active infections  Patient reported infection: None    Therapy Appropriateness:    Is therapy appropriate? Yes, therapy is appropriate and should be continued    DISEASE/MEDICATION-SPECIFIC INFORMATION      N/A    PATIENT SPECIFIC NEEDS     - Does the patient have any physical, cognitive, or cultural barriers? No    - Is the patient high risk? No    - Does the patient require a Care Management Plan? No     - Does the patient require physician intervention or other additional services (i.e. nutrition, smoking cessation, social work)? No      SHIPPING     Specialty Medication(s) to be Shipped:   Infectious Disease: Biktarvy    Other medication(s) to be shipped: No additional medications requested for fill at this time     Changes to insurance: No    Delivery Scheduled: Yes, Expected medication delivery date: 08/30/2020.     Medication will be delivered via Same Day Courier to the confirmed prescription address in [employer].    The patient will receive a drug information handout for each medication shipped and additional FDA Medication Guides as required.  Verified that patient has previously received a Conservation officer, historic buildings and a Surveyor, mining.    All of the patient's questions and concerns have been addressed.    Jenean Lindau, PharmD, MPH  PGY-1 Pharmacy Resident

## 2020-08-30 NOTE — Unmapped (Signed)
Samuel [name].     I have reached out to the patient  at [phone] and communicated the delivery change. We will reschedule the medication for the delivery date that the patient agreed upon.  We have confirmed the delivery date as [name], via same day courier.

## 2020-09-02 MED FILL — BIKTARVY 50 MG-200 MG-25 MG TABLET: ORAL | 30 days supply | Qty: 30 | Fill #1

## 2020-09-02 NOTE — Unmapped (Signed)
Assessment/Plan:              HIV:  Viral load detectable but <40 on current ART (Descovy/dolutegravir), which he tolerates well. However, I switched him from this combination to [facility] during his 08/07/20 visit to simplify regimen and further increase potency. He reports mild headaches, but would like to continue Biktarvy. He agrees to let me know if they persist beyond the next few weeks so that we can reevaluate whether he should return to Descovy/dolutegravir.  I reviewed chart in anticipation of today's visit. We discussed labs.  Cirrhosis well compensated. Renal insufficiency continues to be his biggest current medical problem; he is being evaluated for kidney transplant and so far appears to be a good candidate (pending completion of cardiovascular exam). As noted in the past, I see no HIV-related contraindications to kidney transplantation.     Diarrhea: Resolved.     Obesity: Has worked with nutrition, improved his diet, and is trying to exercise.    Cirrhosis: Followed by GI.     SVR following Zepatier for Chronic HCV Infection (genotype 1a): Stable.    CKD Stage G4:A2-- Followed by Dr Stefano Gaul.    HFpEF: Followed by Cardiology.    HTN: Stable.    Sexual Health:     Mental Health: Stable.    Health maintenance:   Reports COVID vaccine (Moderna) x 2 at [facility] at least 2 mos ago (~07/2019). Booster ~03/2020. Reports another booster mid-Feb for total of 4 doses -- all Moderna.  Pap N/A  RPR False + (1:2 with neg FTA 11/12/2015)  GC/CT - neg 08/26/2016  Hb A1C 01/29/2020: 5.8   Cholesterol See below  TB screening neg, 01/29/2020  Lung cancer screening N/A (quit 1993 after 1 ppd x 10 yrs)  Colonoscopy - To be done by Dr Marcello Fennel          Prevention, adherence and health education:   .  .  .  .  .  .  .    Educational and counseling services took 25  minutes of today's visit.    Follow-up:  Return to clinic in 1 month or sooner if needed.   Subjective:    [name] is a 70 y.o. male who presents to the Infectious Disease clinic for return HIV visit.     Chief Complaint: Follow-up      HPI:   HIV diagnosed in 12/1999. In 07/13/00, CD4 count was 547 (25%) with a viral load of about 5293. For a number of years, his viral load and CD4 count were generally reasonably well maintained in the absence of antiretroviral therapy, and he never had any HIV-related symptoms. However, during 2008-2009, his viral load dramatically increased and his CD4 count fell. Viral load reached a height of 309,000 on 08/31/2007 and CD4 count fell substantially to 244 on 09/29/2007. He had always been reluctant to take antiviral medications and in fact consistently and repeatedly refused hepatitis C treatment as well as HIV treatment in the past. However, because of the deterioration of his CD4 count and viral load, I prescribed Atripla on 10/12/07. Although he agreed to take it he did not done so until more than a year later, despite assurances during each visit that he would begin ART immediately. Once he finally started ART, however, he tolerated it quite well. On 07/24/2015 he was switched from Atripla to Descovy/dolutegravir b/o CKD, and on 08/07/2020 he was switched to [facility] to further simplify regimen.     His HIV has  been well controlled, but he has multiple severe comorbidities, including HCV (SVR after 12 wks of Zepatier 04/20/2016-07/13/2016; HCV RNA BDL 10/28/2016), resultant cirrhosis (Metavir F4), CKD now Stage 4:A2, NSTEMI 10/2015. He is followed closely by Nephrology. Also followed by GI, Cardiology, and PCP (Dr Marcello Fennel). He is being evaluated for renal transplantation.    As noted above, he was switched to [facility] during 08/07/2020 visit. He has been having mild headaches, which he attributes to [place]. He says they improve when he drinks more water. No other sx. Has missed no ART doses since he started it. Still working on exercise. Still gets 3-6K steps per day on Apple Watch.   ROS otherwise unremarkable.         Past Medical History:   Diagnosis Date   ??? CKD (chronic kidney disease) stage 3, GFR 30-59 ml/min (CMS-HCC) 01/08/2015   ??? Coronary artery disease 10/2015    Multivessel disease, plan medical management after cath 6/17   ??? GERD (gastroesophageal reflux disease)    ??? Gout    ??? HIV (human immunodeficiency virus infection) (CMS-HCC)     Undectable viral load 5/17   ??? Hypertension    ??? Nephrolithiasis    ??? Nephrotic syndrome 03/05/2014   ??? Renal mass     Followed by neprhology, MRI 8/16 improved, felt to be benign       Medications:  Current Outpatient Medications   Medication Sig Dispense Refill   ??? albuterol (PROVENTIL HFA;VENTOLIN HFA) 90 mcg/actuation inhaler Inhale 2 puffs. Take as needed     ??? allopurinoL (ZYLOPRIM) 100 MG tablet Take 250 mg by mouth daily.      ??? amLODIPine (NORVASC) 10 MG tablet Take 1 tablet (10 mg total) by mouth daily. 90 tablet 3   ??? aspirin (ECOTRIN) 81 MG tablet daily.   0   ??? atenoloL (TENORMIN) 100 MG tablet Take 1 tablet (100 mg total) by mouth daily. 90 tablet 3   ??? atorvastatin (LIPITOR) 40 MG tablet  (Patient not taking: Reported on 08/07/2020)     ??? BACLOFEN, BULK, TOP Apply topically. Baclofen 2%, Diclofenac 5%, Gabapentin 6%, Tetracaine 3%    Take as needed     ??? bictegrav-emtricit-tenofov ala (BIKTARVY) 50-200-25 mg tablet Take 1 tablet by mouth daily. 30 tablet 11   ??? blood sugar diagnostic (ONETOUCH VERIO TEST STRIPS) Strp Use 2 (two) times daily E11.22     ??? calcium carbonate-vitamin D2 500 mg(1,250mg ) -200 unit tablet Take 1 tablet by mouth Two (2) times a day.      ??? carvediloL (COREG) 12.5 MG tablet 12.5 mg Two (2) times a day.      ??? cholecalciferol, vitamin D3 25 mcg, 1,000 units,, 1,000 unit (25 mcg) tablet Take by mouth daily.     ??? cloNIDine HCL (CATAPRES) 0.1 MG tablet Take 1 tablet (0.1 mg total) by mouth 3 (three) times a day. 270 tablet 3   ??? diazepam (VALIUM) 5 MG tablet Take 5 mg by mouth nightly.      ??? dicyclomine (BENTYL) 10 mg capsule Take 10 mg by mouth two (2) times a day as needed.      ??? docusate sodium (COLACE) 100 MG capsule Take 1 capsule (100 mg total) by mouth every twelve (12) hours. (Patient taking differently: Take 100 mg by mouth every twelve (12) hours. Take as needed) 60 capsule 0   ??? dorzolamide-timoloL (COSOPT) 22.3-6.8 mg/mL ophthalmic solution Administer 1 drop to both eyes Two (2) times a day.      ???  doxazosin (CARDURA) 4 MG tablet Take 1 tablet (4 mg total) by mouth nightly. 90 tablet 3   ??? ferrous sulfate 325 (65 FE) MG tablet Take 325 mg by mouth daily with breakfast.      ??? folic acid (FOLVITE) 1 MG tablet   0   ??? gabapentin (NEURONTIN) 300 MG capsule 300 mg Two (2) times a day.      ??? glimepiride (AMARYL) 4 MG tablet Take 1 tablet (4 mg total) by mouth daily. 90 tablet 3   ??? hydrALAZINE (APRESOLINE) 100 MG tablet Take 1 tablet (100 mg total) by mouth Two (2) times a day. 180 tablet 3   ??? HYDROcodone-acetaminophen (NORCO) 5-325 mg per tablet Directions are take one tablet 30 minutes before physical therapy, then take one tablet at bedtime as needed for pain     ??? lisinopriL (PRINIVIL,ZESTRIL) 40 MG tablet Take 1 tablet (40 mg total) by mouth daily. 90 tablet 3   ??? meTOLAzone (ZAROXOLYN) 10 MG tablet Take 1 tablet (10 mg total) by mouth daily. 90 tablet 3   ??? metroNIDAZOLE (FLAGYL) 500 MG tablet Take 500 mg by mouth. (Patient not taking: Reported on 08/07/2020)     ??? nitroglycerin (NITROSTAT) 0.4 MG SL tablet DISSOLVE ONE TABLET UNDER TONGUE AS NEEDED FOR CHEST PAIN EVERY 5 MINUTES AS DIRECTED 25 tablet 0   ??? nystatin (MYCOSTATIN) 100,000 unit/mL suspension Take 5 mL by mouth daily.      ??? ondansetron (ZOFRAN) 4 MG tablet take 1 tablet by mouth every 8 hours if needed for nausea     ??? oxyCODONE-acetaminophen (PERCOCET) 5-325 mg per tablet Take 1 tablet by mouth daily.      ??? ROCKLATAN 0.02-0.005 % Drop INT 1 GTT INTO OU QHS     ??? sertraline (ZOLOFT) 50 MG tablet take 1 tablet by mouth once daily     ??? SITagliptin (JANUVIA) 50 MG tablet Take 1 tablet (50 mg total) by mouth daily. 90 tablet 3   ??? spironolactone (ALDACTONE) 25 MG tablet Take 1 tablet (25 mg total) by mouth daily. 90 tablet 3   ??? tamsulosin (FLOMAX) 0.4 mg capsule TAKE 1 CAPSULE(0.4 MG) BY MOUTH DAILY (Patient not taking: Reported on 08/07/2020) 90 capsule 3   ??? torsemide (DEMADEX) 20 MG tablet Take 2 tablets (40 mg total) by mouth daily. 90 tablet 3     No current facility-administered medications for this visit.       Allergies: Colchicine analogues and Tramadol    Social History:  As per MEDICAL RECORD NUMBERNo cigs or alcohol at all. No current IDU (quit many years ago). Lives with wife.     Review of Systems:  A 12 point review of systems was negative except for pertinent items noted in the HPI.    Objective:       BP 178/87 (BP Site: L Arm, BP Position: Sitting, BP Cuff Size: Medium)  - Pulse 57  - Temp 36.8 ??C (98.3 ??F) (Oral)  - Ht 180.3 cm (5' 11)  - Wt 96.3 kg (212 lb 3.2 oz)  - BMI 29.60 kg/m??     GEN:  looks well, no apparent distress  PSYCH:attentive, appropriate affect, good eye contact, fluent speech   LN: No cervical or axillary LAD  LUNGS: Clear  CV: Nl s1, s2 without s3, s4, m  ABD: Nl BS, nontender, without HSM  EXT: No edema    Recent Labs:    Lab Results   Component Value Date  RPR Nonreactive 01/29/2020    RPR Nonreactive 06/08/2018    GCNAA Negative 06/08/2018    CTNAA Negative 06/08/2018    A1C 5.8 (H) 01/29/2020    A1C 6.8 (H) 06/08/2018    A1C 5.7 11/12/2015    CHOL 88 01/29/2020    CHOL 111 09/16/2016    CHOL 148 11/12/2015    LDL 27 (L) 01/29/2020    LDL 39 (L) 09/16/2016    LDL 80 11/12/2015    HDL 23 (L) 01/29/2020    HDL 32 (L) 09/16/2016    HDL 37 (L) 11/12/2015    TRIG 188 (H) 01/29/2020    TRIG 198 (H) 09/16/2016    TRIG 155 (H) 11/12/2015    CHOLHDLRATIO 3.8 01/29/2020    CHOLHDLRATIO 3.5 09/16/2016    CHOLHDLRATIO 4.0 11/12/2015    QFTTBGOLD Negative 01/29/2020         Absolute CD4 Count   Date Value Ref Range Status   08/07/2020 640 510-2,320 /uL Final   01/29/2020 649 510-2,320 /uL Final   12/26/2019 736 510-2,320 /uL Final   10/10/2019 693 510-2,320 /uL Final   08/01/2014 514 510 - 2,320 /uL Final   01/03/2014 430 (L) 510 - 2,320 /uL Final   11/23/2012 549 510 - 2,320 /uL Final   07/20/2012 470 (L) 510 - 2,320 CELLS/UL Final     CD4% (T Helper)   Date Value Ref Range Status   08/07/2020 32 (L) 34 - 58 % Final   01/29/2020 31 (L) 34 - 58 % Final   12/26/2019 32 (L) 34 - 58 % Final   10/10/2019 33 (L) 34 - 58 % Final   08/01/2014 35 34 - 58 % Final   01/03/2014 34 34 - 58 % Final   11/23/2012 31 (L) 34 - 58 % Final   07/20/2012 29 (L) 34 - 58 % OF LYMPHS Final     HIV RNA Quant Result   Date Value Ref Range Status   08/07/2020 Not Detected Not Detected Final   01/29/2020 Not Detected Not Detected Final   12/26/2019 Detected (A) Not Detected Final   10/10/2019 Not Detected Not Detected Final   08/15/2014 Not Detected  Final   01/03/2014 Not Detected  Final   11/23/2012 Not Detected  Final   07/20/2012 Not Detected  Final     HIV RNA   Date Value Ref Range Status   05/09/2020 <40 copies/mL Final     Comment:     HIV-1 RNA not detected  The reportable range for this assay is 40 to 10,000,000  copies HIV-1 RNA/mL.     12/26/2019 <40 (H) <0 copies/mL Final   05/16/2019 <40 copies/mL Final     Comment:     HIV-1 RNA not detected  The reportable range for this assay is 40 to 10,000,000  copies HIV-1 RNA/mL.     09/12/2018 <40 copies/mL Final     Comment:     HIV-1 RNA not detected  The reportable range for this assay is 40 to 10,000,000  copies HIV-1 RNA/mL.     12/01/2017 <40 (H) <0 copies/mL Final   09/01/2017 <40 (H) <0 copies/mL Final   04/02/2017 <40 (H) <0 copies/mL Final     HIV RNA Log(10)   Date Value Ref Range Status   05/09/2020 CANCELED log10copy/mL      Comment:     Unable to calculate result since non-numeric result obtained for  component test.    Result canceled by the ancillary.  12/26/2019   Final     Comment:     <1.6 log   05/16/2019 CANCELED log10copy/mL      Comment: Unable to calculate result since non-numeric result obtained for  component test.    Result canceled by the ancillary.     09/12/2018 CANCELED log10copy/mL      Comment:     Unable to calculate result since non-numeric result obtained for  component test.    Result canceled by the ancillary.     12/01/2017  <0.00 log copies/mL Final     Comment:     <1.6 log   09/01/2017  <0.00 log copies/mL Final     Comment:     <1.6 log   04/02/2017  <0.00 log copies/mL Final     Comment:     <1.6 log         Absolute CD4 Count   Date Value Ref Range Status   08/07/2020 640 510-2,320 /uL Final   01/29/2020 649 510-2,320 /uL Final   12/26/2019 736 510-2,320 /uL Final   10/10/2019 693 510-2,320 /uL Final   08/01/2014 514 510 - 2,320 /uL Final   01/03/2014 430 (L) 510 - 2,320 /uL Final   11/23/2012 549 510 - 2,320 /uL Final   07/20/2012 470 (L) 510 - 2,320 CELLS/UL Final     HIV RNA   Date Value Ref Range Status   05/09/2020 <40 copies/mL Final     Comment:     HIV-1 RNA not detected  The reportable range for this assay is 40 to 10,000,000  copies HIV-1 RNA/mL.     12/26/2019 <40 (H) <0 copies/mL Final   05/16/2019 <40 copies/mL Final     Comment:     HIV-1 RNA not detected  The reportable range for this assay is 40 to 10,000,000  copies HIV-1 RNA/mL.     09/12/2018 <40 copies/mL Final     Comment:     HIV-1 RNA not detected  The reportable range for this assay is 40 to 10,000,000  copies HIV-1 RNA/mL.     12/01/2017 <40 (H) <0 copies/mL Final   09/01/2017 <40 (H) <0 copies/mL Final   04/02/2017 <40 (H) <0 copies/mL Final      Lab Results   Component Value Date    WBC 8.8 08/07/2020    WBC 8.6 05/09/2020    HGB 13.6 08/07/2020    HGB 15.6 05/09/2020    Platelet 147 (L) 08/07/2020    Platelet 148 (L) 05/09/2020    Creatinine 2.53 (H) 08/07/2020    Creatinine 2.53 (H) 05/09/2020    AST 63 (H) 08/07/2020    AST 56 (H) 05/09/2020    ALT 114 (H) 08/07/2020    ALT 101 (H) 05/09/2020    Triglycerides 188 (H) 01/29/2020    Triglycerides 80 01/03/2014    HDL 23 (L) 01/29/2020    HDL 53 01/03/2014    LDL Calculated 27 (L) 01/29/2020    LDL Cholesterol, Calculated 71 01/03/2014       Immunization History   Administered Date(s) Administered   ??? COVID-19 VACCINE,MRNA(MODERNA)(PF)(IM) 08/12/2019, 03/27/2020   ??? HEPB-CPG,ADULT DOSAGE (2 DOSE SCHEDULE)ADJUVANTED, IM 01/31/2020   ??? Hepatitis B, Adult 04/06/2016, 05/21/2016, 08/24/2016   ??? INFLUENZA INJ MDCK PF, QUAD,(FLUCELVAX)(72MO AND UP EGG FREE) 02/28/2019   ??? INFLUENZA TIV (TRI) PF (IM) 02/08/2008, 03/26/2010, 03/25/2011   ??? Influenza Vaccine Quad (IIV4 PF) 74mo+ injectable 07/24/2015, 02/05/2016, 03/08/2018, 03/05/2020   ??? Influenza Virus Vaccine, unspecified formulation 06/15/2014, 07/24/2015,  02/05/2016, 03/15/2016, 03/31/2017   ??? PNEUMOCOCCAL POLYSACCHARIDE 23 07/13/2000, 08/19/2005, 12/01/2017   ??? PPD Test 12/29/2006, 07/11/2008, 03/26/2010, 03/25/2011   ??? Pneumococcal Conjugate 13-Valent 04/13/2012   ??? SHINGRIX-ZOSTER VACCINE (HZV), RECOMBINANT,SUB-UNIT,ADJUVANTED IM 09/16/2016, 12/01/2017   ??? TdaP 08/31/2007, 08/04/2010, 10/27/2018   ??? Tuberculin Skin Test;unspecified Formulation 12/29/2006, 07/11/2008, 03/26/2010, 03/25/2011

## 2020-09-04 ENCOUNTER — Ambulatory Visit
Admit: 2020-09-04 | Discharge: 2020-09-05 | Payer: MEDICARE | Attending: Infectious Disease | Primary: Infectious Disease

## 2020-09-04 DIAGNOSIS — B20 Human immunodeficiency virus [HIV] disease: Principal | ICD-10-CM

## 2020-09-04 LAB — BASIC METABOLIC PANEL
ANION GAP: 6 mmol/L (ref 5–14)
BLOOD UREA NITROGEN: 28 mg/dL — ABNORMAL HIGH (ref 9–23)
BUN / CREAT RATIO: 12
CALCIUM: 10.7 mg/dL — ABNORMAL HIGH (ref 8.7–10.4)
CHLORIDE: 110 mmol/L — ABNORMAL HIGH (ref 98–107)
CO2: 27.5 mmol/L (ref 20.0–31.0)
CREATININE: 2.42 mg/dL — ABNORMAL HIGH
EGFR CKD-EPI AA MALE: 31 mL/min/{1.73_m2} — ABNORMAL LOW (ref >=60)
EGFR CKD-EPI NON-AA MALE: 26 mL/min/{1.73_m2} — ABNORMAL LOW (ref >=60)
GLUCOSE RANDOM: 143 mg/dL (ref 70–179)
POTASSIUM: 4.6 mmol/L (ref 3.5–5.1)
SODIUM: 143 mmol/L (ref 135–145)

## 2020-09-04 LAB — LYMPH MARKER LIMITED,FLOW
ABSOLUTE CD3 CNT: 1512 {cells}/uL (ref 915–3400)
ABSOLUTE CD4 CNT: 735 {cells}/uL (ref 510–2320)
ABSOLUTE CD8 CNT: 735 {cells}/uL (ref 180–1520)
CD3% (T CELLS): 72 % (ref 61–86)
CD4% (T HELPER): 35 % (ref 34–58)
CD4:CD8 RATIO: 1 (ref 0.9–4.8)
CD8% T SUPPRESR: 35 % (ref 12–38)

## 2020-09-04 LAB — CBC W/ AUTO DIFF
BASOPHILS ABSOLUTE COUNT: 0 10*9/L (ref 0.0–0.1)
BASOPHILS RELATIVE PERCENT: 0.4 %
EOSINOPHILS ABSOLUTE COUNT: 0.1 10*9/L (ref 0.0–0.5)
EOSINOPHILS RELATIVE PERCENT: 1.6 %
HEMATOCRIT: 41.3 % (ref 39.0–48.0)
HEMOGLOBIN: 14 g/dL (ref 12.9–16.5)
LYMPHOCYTES ABSOLUTE COUNT: 2.1 10*9/L (ref 1.1–3.6)
LYMPHOCYTES RELATIVE PERCENT: 27.1 %
MEAN CORPUSCULAR HEMOGLOBIN CONC: 34 g/dL (ref 32.0–36.0)
MEAN CORPUSCULAR HEMOGLOBIN: 32 pg (ref 25.9–32.4)
MEAN CORPUSCULAR VOLUME: 94.1 fL (ref 77.6–95.7)
MEAN PLATELET VOLUME: 8.6 fL (ref 6.8–10.7)
MONOCYTES ABSOLUTE COUNT: 0.5 10*9/L (ref 0.3–0.8)
MONOCYTES RELATIVE PERCENT: 6.9 %
NEUTROPHILS ABSOLUTE COUNT: 4.9 10*9/L (ref 1.8–7.8)
NEUTROPHILS RELATIVE PERCENT: 64 %
NUCLEATED RED BLOOD CELLS: 0 /100{WBCs} (ref <=4)
PLATELET COUNT: 140 10*9/L — ABNORMAL LOW (ref 150–450)
RED BLOOD CELL COUNT: 4.39 10*12/L (ref 4.26–5.60)
RED CELL DISTRIBUTION WIDTH: 13.7 % (ref 12.2–15.2)
WBC ADJUSTED: 7.7 10*9/L (ref 3.6–11.2)

## 2020-09-04 LAB — AST: AST (SGOT): 37 U/L — ABNORMAL HIGH (ref <=34)

## 2020-09-04 LAB — ALT: ALT (SGPT): 78 U/L — ABNORMAL HIGH (ref 10–49)

## 2020-09-04 LAB — BILIRUBIN, TOTAL: BILIRUBIN TOTAL: 0.8 mg/dL (ref 0.3–1.2)

## 2020-09-04 NOTE — Unmapped (Signed)
Please contact clinic if headaches worsen or persist beyond the next 3 weeks.

## 2020-09-05 LAB — HIV RNA, QUANTITATIVE, PCR
HIV RNA QNT RSLT: DETECTED — AB
HIV RNA: <40 {copies}/mL — ABNORMAL HIGH (ref <0)

## 2020-09-09 DIAGNOSIS — N184 Chronic kidney disease, stage 4 (severe): Principal | ICD-10-CM

## 2020-09-23 DIAGNOSIS — N184 Chronic kidney disease, stage 4 (severe): Principal | ICD-10-CM

## 2020-09-23 NOTE — Unmapped (Unsigned)
HISTORY OF PRESENT ILLNESS:      [name] is a 70 year old man with stage G4:A2 chronic kidney disease who is evaluated today for stage G4:A2 chronic kidney disease.      Over the past several weeks, he has experienced increased abdominal bloating and gaseous distention.  He also notes concurrent loose stools.  He has not had increased leg edema, chest pain, PND, or orthopnea.  His systolic blood pressure has been running somewhat high over the past several weeks, and in office measured reading today was elevated at 155/79 mmHg.    MEDICATIONS:     Medication Sig   ??? albuterol (PROVENTIL HFA;VENTOLIN HFA) 90 mcg/actuation inhaler Inhale 2 puffs. Take as needed   ??? allopurinoL (ZYLOPRIM) 100 MG tablet Take 250 mg by mouth daily.    ??? amLODIPine (NORVASC) 10 MG tablet Take 1 tablet (10 mg total) by mouth daily.   ??? aspirin (ECOTRIN) 81 MG tablet daily.    ??? atenoloL (TENORMIN) 100 MG tablet Take 1 tablet (100 mg total) by mouth daily.   ??? atorvastatin (LIPITOR) 40 MG tablet  (Patient not taking: Reported on 08/07/2020)   ??? BACLOFEN, BULK, TOP Apply topically. Baclofen 2%, Diclofenac 5%, Gabapentin 6%, Tetracaine 3%    Take as needed   ??? bictegrav-emtricit-tenofov ala (BIKTARVY) 50-200-25 mg tablet Take 1 tablet by mouth daily.   ??? blood sugar diagnostic (ONETOUCH VERIO TEST STRIPS) Strp Use 2 (two) times daily E11.22   ??? calcium carbonate-vitamin D2 500 mg(1,250mg ) -200 unit tablet Take 1 tablet by mouth Two (2) times a day.    ??? carvediloL (COREG) 12.5 MG tablet 12.5 mg Two (2) times a day.    ??? cholecalciferol, vitamin D3 25 mcg, 1,000 units,, 1,000 unit (25 mcg) tablet Take by mouth daily.   ??? cloNIDine HCL (CATAPRES) 0.1 MG tablet Take 1 tablet (0.1 mg total) by mouth 3 (three) times a day.   ??? diazepam (VALIUM) 5 MG tablet Take 5 mg by mouth nightly.    ??? dicyclomine (BENTYL) 10 mg capsule Take 10 mg by mouth two (2) times a day as needed.    ??? docusate sodium (COLACE) 100 MG capsule Take 1 capsule (100 mg total) by mouth every twelve (12) hours. (Patient taking differently: Take 100 mg by mouth every twelve (12) hours. Take as needed)   ??? dorzolamide-timoloL (COSOPT) 22.3-6.8 mg/mL ophthalmic solution Administer 1 drop to both eyes Two (2) times a day.    ??? doxazosin (CARDURA) 4 MG tablet Take 1 tablet (4 mg total) by mouth nightly.   ??? ferrous sulfate 325 (65 FE) MG tablet Take 325 mg by mouth daily with breakfast.    ??? folic acid (FOLVITE) 1 MG tablet    ??? gabapentin (NEURONTIN) 300 MG capsule 300 mg Two (2) times a day.    ??? glimepiride (AMARYL) 4 MG tablet Take 1 tablet (4 mg total) by mouth daily.   ??? hydrALAZINE (APRESOLINE) 100 MG tablet Take 1 tablet (100 mg total) by mouth Two (2) times a day.   ??? HYDROcodone-acetaminophen (NORCO) 5-325 mg per tablet Directions are take one tablet 30 minutes before physical therapy, then take one tablet at bedtime as needed for pain   ??? lisinopriL (PRINIVIL,ZESTRIL) 40 MG tablet Take 1 tablet (40 mg total) by mouth daily.   ??? meTOLAzone (ZAROXOLYN) 10 MG tablet Take 1 tablet (10 mg total) by mouth daily.   ??? metroNIDAZOLE (FLAGYL) 500 MG tablet Take 500 mg by mouth. (Patient not  taking: Reported on 08/07/2020)   ??? nitroglycerin (NITROSTAT) 0.4 MG SL tablet DISSOLVE ONE TABLET UNDER TONGUE AS NEEDED FOR CHEST PAIN EVERY 5 MINUTES AS DIRECTED   ??? nystatin (MYCOSTATIN) 100,000 unit/mL suspension Take 5 mL by mouth daily.    ??? ondansetron (ZOFRAN) 4 MG tablet take 1 tablet by mouth every 8 hours if needed for nausea   ??? oxyCODONE-acetaminophen (PERCOCET) 5-325 mg per tablet Take 1 tablet by mouth daily.    ??? ROCKLATAN 0.02-0.005 % Drop INT 1 GTT INTO OU QHS   ??? sertraline (ZOLOFT) 50 MG tablet take 1 tablet by mouth once daily   ??? SITagliptin (JANUVIA) 50 MG tablet Take 1 tablet (50 mg total) by mouth daily.   ??? spironolactone (ALDACTONE) 25 MG tablet Take 1 tablet (25 mg total) by mouth daily.   ??? tamsulosin (FLOMAX) 0.4 mg capsule TAKE 1 CAPSULE(0.4 MG) BY MOUTH DAILY (Patient not taking: Reported on 08/07/2020)   ??? torsemide (DEMADEX) 20 MG tablet Take 2 tablets (40 mg total) by mouth daily.     REVIEW OF SYSTEMS:  Constitutional: No fevers or chills. Cardiovascular: No chest pain.  Pulmonary:  All other systems are reviewed and are negative except that noted in the history of present illness.    PHYSICAL EXAMINATION:   ??  Constitutional: He is in no acute distress.  Vital signs:????  HEENT: Masked.    Neck: Supple without lymphadenopathy or thyromegaly. No carotid bruits.   Lungs: Clear to auscultation   Heart: Regular rate and rhythm. Normal S1-S2.   Abdomen: Soft, nontender, normoactive bowel sounds, without organomegaly or mass. No abdominal bruits. ????  Neurologic: Alert and oriented x 3.????Strength is normal. ??DTRs are 2+ and symmetric. ??There are no lateralizing sensory deficits.  Extremities: Trace pedal edema. Dorsalis pedis and posterior tibial pulses are palpable. ??  Node survey: No lymphadenopathy.   Musculoskeletal: No active synovitis.??  Skin/Nails: ??No rash or nail changes.??     LABORATORY STUDIES:        IMPRESSION AND PLAN:    1.  Stage G4:A2 chronic kidney disease.     His kidney function is stable.  His blood pressure is not at goal.  Therefore, amlodipine was increased to 10 mg once daily.  ??  2.  Hypertension.      As above, amlodipine was increased to 10 mg daily.  He will continue torsemide 40 mg once daily, spironolactone 25 mg once daily, atenolol 100 mg once daily, doxazosin 4 mg daily, lisinopril 40 mg once daily, and hydralazine 100 mg twice daily.      3.  Human immunodeficiency virus disease.  He is doing well on HAART therapy.     4.  Lower urinary tract symptoms. Continue tamsulosin.    5.  Secondary/tertiary hyperparathyroidism.  The serum calcium level is acceptable.    6.  Nephrolithiasis.        A recent CT scan showed nonobstructing stones in both kidneys. He spontaneously passed two small stone fragments in November. I suspect that no further intervention is required at this time. However, it would be reasonable for him to be seen in the stone clinic at some point. I will discuss a referral with him.  ??  7.  Abdominal distention.       I suspect his symptoms are secondary to bacterial overgrowth.  He will be treated with an empiric course of metronidazole 500 mg 3 times daily x10 days.    8.  Mild sleep apnea.  He has been referred for a CPAP titration study.    9.  Right hip screw dislodgment, chronic.     I have referred him to [facility].   ??  10. Follow up plans.  He will return to see me in about 3 months.        Zetta Bills. Stefano Gaul, MD  Date of service 10/01/2020

## 2020-10-01 ENCOUNTER — Ambulatory Visit: Admit: 2020-10-01 | Payer: MEDICARE | Attending: Nephrology | Primary: Nephrology

## 2020-10-01 NOTE — Unmapped (Signed)
[facility] Refill Coordination Note    Specialty Medication(s) to be Shipped:   Infectious Disease: Biktarvy    Other medication(s) to be shipped: No additional medications requested for fill at this time     Samuel [name], DOB: 09-04-1950  Phone: [phone] (home)       All above HIPAA information was verified with patient.     Was a Nurse, learning disability used for this call? No    Completed refill call assessment today to schedule patient's medication shipment from the [facility] [phone]).  All relevant notes have been reviewed.     Specialty medication(s) and dose(s) confirmed: Regimen is correct and unchanged.   Changes to medications: [name] reports no changes at this time.  Changes to insurance: No  New side effects reported not previously addressed with a pharmacist or physician: None reported  Questions for the pharmacist: No    Confirmed patient received a Conservation officer, historic buildings and a Surveyor, mining with first shipment. The patient will receive a drug information handout for each medication shipped and additional FDA Medication Guides as required.       DISEASE/MEDICATION-SPECIFIC INFORMATION        N/A    SPECIALTY MEDICATION ADHERENCE     Medication Adherence    Patient reported X missed doses in the last month: 0  Specialty Medication: Biktarvy  Patient is on additional specialty medications: No              Were doses missed due to medication being on hold? No    Biktarvy 50-200-25 mg: 7 days of medicine on hand        REFERRAL TO PHARMACIST     Referral to the pharmacist: Not needed      [facility]     Shipping address confirmed in Epic.     Delivery Scheduled: Yes, Expected medication delivery date: 10/04/20.     Medication will be delivered via Next Day Courier to the prescription address in Epic WAM.    [name]   [facility] Specialty Technician

## 2020-10-03 MED FILL — BIKTARVY 50 MG-200 MG-25 MG TABLET: ORAL | 30 days supply | Qty: 30 | Fill #2

## 2020-10-07 DIAGNOSIS — N184 Chronic kidney disease, stage 4 (severe): Principal | ICD-10-CM

## 2020-10-25 ENCOUNTER — Ambulatory Visit: Admit: 2020-10-25 | Discharge: 2020-10-26 | Payer: MEDICARE

## 2020-10-25 NOTE — Unmapped (Signed)
[facility] Refill Coordination Note    Specialty Medication(s) to be Shipped:   Infectious Disease: Biktarvy    Other medication(s) to be shipped: No additional medications requested for fill at this time     Samuel [name], DOB: 18-Sep-1950  Phone: [phone] (home)       All above HIPAA information was verified with patient.     Was a Nurse, learning disability used for this call? No    Completed refill call assessment today to schedule patient's medication shipment from the [facility] [phone]).  All relevant notes have been reviewed.     Specialty medication(s) and dose(s) confirmed: Regimen is correct and unchanged.   Changes to medications: [name] reports no changes at this time.  Changes to insurance: No  New side effects reported not previously addressed with a pharmacist or physician: None reported  Questions for the pharmacist: No    Confirmed patient received a Conservation officer, historic buildings and a Surveyor, mining with first shipment. The patient will receive a drug information handout for each medication shipped and additional FDA Medication Guides as required.       DISEASE/MEDICATION-SPECIFIC INFORMATION        N/A    SPECIALTY MEDICATION ADHERENCE     Medication Adherence    Patient reported X missed doses in the last month: 0  Specialty Medication: biktarvy 50-200-25mg   Patient is on additional specialty medications: No  Patient is on more than two specialty medications: No  Any gaps in refill history greater than 2 weeks in the last 3 months: no  Demonstrates understanding of importance of adherence: yes  Informant: patient  Reliability of informant: reliable  Provider-estimated medication adherence level: good  Patient is at risk for Non-Adherence: No              Were doses missed due to medication being on hold? No    biktarvy 50-200-25 mg: 10 days of medicine on hand         REFERRAL TO PHARMACIST     Referral to the pharmacist: Not needed      [facility]     Shipping address confirmed in Epic.     Delivery Scheduled: Yes, Expected medication delivery date: 06/02.     Medication will be delivered via Next Day Courier to the prescription address in Epic WAM.    Antonietta Barcelona   [facility] Specialty Technician

## 2020-10-30 MED FILL — BIKTARVY 50 MG-200 MG-25 MG TABLET: ORAL | 30 days supply | Qty: 30 | Fill #3

## 2020-10-31 LAB — EXTERNAL GENERIC LAB PROCEDURE

## 2020-10-31 LAB — COLOGUARD

## 2020-11-08 NOTE — Unmapped (Signed)
HISTORY OF PRESENT ILLNESS:      [name] is a 70 year old man with stage G4:A2 chronic kidney disease who is evaluated today for stage G4:A2 chronic kidney disease.  [name] continues to do reasonably well with stable kidney function and well-controlled hypertension.  He has not had a change in his baseline abdominal bloating.  His leg edema is stable.  He has not had chest pain, PND, or orthopnea.    MEDICATIONS:    Medication Sig   ??? albuterol (PROVENTIL HFA;VENTOLIN HFA) 90 mcg/actuation inhaler Inhale 2 puffs. Take as needed   ??? allopurinoL (ZYLOPRIM) 100 MG tablet Take 250 mg by mouth daily.    ??? amLODIPine (NORVASC) 10 MG tablet Take 1 tablet (10 mg total) by mouth daily.   ??? aspirin (ECOTRIN) 81 MG tablet daily.    ??? atorvastatin (LIPITOR) 40 MG tablet    ??? BACLOFEN, BULK, TOP Apply topically. Baclofen 2%, Diclofenac 5%, Gabapentin 6%, Tetracaine 3%    Take as needed   ??? bictegrav-emtricit-tenofov ala (BIKTARVY) 50-200-25 mg tablet Take 1 tablet by mouth daily.   ??? blood sugar diagnostic (ONETOUCH VERIO TEST STRIPS) Strp Use 2 (two) times daily E11.22   ??? calcium carbonate-vitamin D2 500 mg(1,250mg ) -200 unit tablet Take 1 tablet by mouth Two (2) times a day.    ??? carvediloL (COREG) 12.5 MG tablet 12.5 mg Two (2) times a day.    ??? cholecalciferol, vitamin D3 25 mcg, 1,000 units,, 1,000 unit (25 mcg) tablet Take by mouth daily.   ??? diazepam (VALIUM) 5 MG tablet Take 5 mg by mouth nightly.    ??? dicyclomine (BENTYL) 10 mg capsule Take 10 mg by mouth two (2) times a day as needed.    ??? docusate sodium (COLACE) 100 MG capsule Take 1 capsule (100 mg total) by mouth every twelve (12) hours. (Patient taking differently: Take 100 mg by mouth every twelve (12) hours. Take as needed)   ??? dorzolamide-timoloL (COSOPT) 22.3-6.8 mg/mL ophthalmic solution Administer 1 drop to both eyes Two (2) times a day.    ??? ferrous sulfate 325 (65 FE) MG tablet Take 325 mg by mouth daily with breakfast.    ??? folic acid (FOLVITE) 1 MG tablet    ??? gabapentin (NEURONTIN) 300 MG capsule 300 mg Two (2) times a day.    ??? HYDROcodone-acetaminophen (NORCO) 5-325 mg per tablet Directions are take one tablet 30 minutes before physical therapy, then take one tablet at bedtime as needed for pain   ??? metroNIDAZOLE (FLAGYL) 500 MG tablet Take 500 mg by mouth.   ??? nitroglycerin (NITROSTAT) 0.4 MG SL tablet DISSOLVE ONE TABLET UNDER TONGUE AS NEEDED FOR CHEST PAIN EVERY 5 MINUTES AS DIRECTED   ??? nystatin (MYCOSTATIN) 100,000 unit/mL suspension Take 5 mL by mouth daily.    ??? ondansetron (ZOFRAN) 4 MG tablet take 1 tablet by mouth every 8 hours if needed for nausea   ??? oxyCODONE-acetaminophen (PERCOCET) 5-325 mg per tablet Take 1 tablet by mouth daily.    ??? ROCKLATAN 0.02-0.005 % Drop INT 1 GTT INTO OU QHS   ??? sertraline (ZOLOFT) 50 MG tablet take 1 tablet by mouth once daily   ??? spironolactone (ALDACTONE) 25 MG tablet Take 1 tablet (25 mg total) by mouth daily.   ??? tamsulosin (FLOMAX) 0.4 mg capsule TAKE 1 CAPSULE(0.4 MG) BY MOUTH DAILY   ??? atenoloL (TENORMIN) 100 MG tablet Take 1 tablet (100 mg total) by mouth daily.   ??? cloNIDine HCL (CATAPRES) 0.1  MG tablet Take 1 tablet (0.1 mg total) by mouth 3 (three) times a day.   ??? doxazosin (CARDURA) 4 MG tablet Take 1 tablet (4 mg total) by mouth nightly.   ??? glimepiride (AMARYL) 4 MG tablet Take 1 tablet (4 mg total) by mouth daily.   ??? hydrALAZINE (APRESOLINE) 100 MG tablet Take 1 tablet (100 mg total) by mouth Two (2) times a day.   ??? lisinopriL (PRINIVIL,ZESTRIL) 40 MG tablet Take 1 tablet (40 mg total) by mouth daily.   ??? meTOLAzone (ZAROXOLYN) 10 MG tablet Take 1 tablet (10 mg total) by mouth daily.   ??? SITagliptin (JANUVIA) 50 MG tablet Take 1 tablet (50 mg total) by mouth daily.   ??? torsemide (DEMADEX) 20 MG tablet Take 2 tablets (40 mg total) by mouth daily.     REVIEW OF SYSTEMS:  Constitutional: No fevers or chills. Cardiovascular: No chest pain.  Pulmonary:  All other systems are reviewed and are negative except that noted in the history of present illness.    PHYSICAL EXAMINATION:   ??  Constitutional: He is in no acute distress.  Vital signs: BP 138/76 (BP Site: L Arm, BP Position: Sitting, BP Cuff Size: Large)  - Pulse 63  - Temp 36.8 ??C (98.3 ??F) (Temporal)  - Ht 180.3 cm (5' 11)  - Wt 98.2 kg (216 lb 6.4 oz)  - BMI 30.18 kg/m??   Wt Readings from Last 3 Encounters:   11/12/20 98.2 kg (216 lb 6.4 oz)   09/04/20 96.3 kg (212 lb 3.2 oz)   08/07/20 96.2 kg (212 lb)   HEENT: Masked.    Neck: Supple without lymphadenopathy or thyromegaly. No carotid bruits.   Lungs: Clear to auscultation   Heart: Regular rate and rhythm. Normal S1-S2.   Abdomen: Soft, nontender, normoactive bowel sounds, without organomegaly or mass. No abdominal bruits. ????  Neurologic: Alert and oriented x 3.????Strength is normal. ??DTRs are 2+ and symmetric. ??There are no lateralizing sensory deficits.  Extremities: Trace pedal edema. Dorsalis pedis and posterior tibial pulses are palpable. ??  Node survey: No lymphadenopathy.   Musculoskeletal: No active synovitis.??  Skin/Nails: ??No rash or nail changes.??     LABORATORY STUDIES:    Renal Function Panel    Collection Time: 11/08/20  4:05 PM   Result Value Ref Range    Glucose 133 (H) 65 - 99 mg/dL    BUN 23 8 - 27 mg/dL    Creatinine 2.95 (H) 0.76 - 1.27 mg/dL    eGFR 25 (L) >62 ZH/YQM/5.78    BUN/Creatinine Ratio 9 (L) 10 - 24    Sodium 143 134 - 144 mmol/L    Potassium 4.7 3.5 - 5.2 mmol/L    Chloride 102 96 - 106 mmol/L    CO2 22 20 - 29 mmol/L    Calcium 10.7 (H) 8.6 - 10.2 mg/dL    Phosphorus, Serum 2.9 2.8 - 4.1 mg/dL    Albumin 5.1 (H) 3.8 - 4.8 g/dL         IMAGING:    CT of the abdomen and pelvis 03/29/20:    IMPRESSION:    No calcified atherosclerotic disease in the bilateral external iliac arteries.  ??  Bilateral nonobstructive nephrolithiasis.  ??  There is a surgical screw anterior to the right hip joint resting within the quadriceps muscles that appears to have dislodged from the fixation plate adjacent to the acetabulum. This screw appears to have been in this position since at least the 2013  study. In the 2007 study, this screw appears in place within the fixation plate adjacent to the acetabulum.  ??  New punctate sclerotic foci in the left acetabulum and left superior pubic ramus, indeterminate.  ??  0.4 cm pulmonary nodules in the lower lobe right lung. Follow-up per Fleischner criteria as below.  ??  ??  Recommend follow-up as per the 2017 Fleischner society guidelines (pulmonary nodules smaller than 8 mm detected incidentally in patients >35 years on non-screening CT)  ??  ==============================================================  ??  For single solid nodules < 6 mm:  low-risk patients: no routine follow-up required  high-risk patients: optional CT at 12 months (particularly with suspicious nodule morphology and/or upper lobe location; see risk assessment below)      IMPRESSION AND PLAN:    1.  Stage G4:A2 chronic kidney disease.  His kidney function remains reasonably stable.  His blood pressure is now at goal.  He will continue his current antihypertensive regimen.    His kidney function is stable.  His blood pressure is not at goal.  Therefore, amlodipine was increased to 10 mg once daily.  ??  2.  Hypertension.  His antihypertensive regimen will continue to consist of amlodipine was increased to 10 mg daily, torsemide 40 mg once daily, spironolactone 25 mg once daily, atenolol 100 mg once daily, doxazosin 4 mg daily, lisinopril 40 mg once daily, and hydralazine 100 mg twice daily.      3.  Human immunodeficiency virus disease.  Stable on HAART therapy.     4.  Lower urinary tract symptoms.  His symptoms are well controlled on tamsulosin.    5.  Secondary/tertiary hyperparathyroidism.  The serum calcium level is acceptable.    6.  Nephrolithiasis.   He will continue hydration with >2 L of fluid intake per day.  ??  7.  Mild sleep apnea.  He has been referred for a CPAP titration study.    8. Follow up plans.  He will return to see me in about 3 months.        Zetta Bills. Stefano Gaul, MD  Date of service 11/12/2020

## 2020-11-09 LAB — RENAL FUNCTION PANEL
ALBUMIN: 5.1 g/dL — ABNORMAL HIGH (ref 3.8–4.8)
BLOOD UREA NITROGEN: 23 mg/dL (ref 8–27)
BUN / CREAT RATIO: 9 — ABNORMAL LOW (ref 10–24)
CALCIUM: 10.7 mg/dL — ABNORMAL HIGH (ref 8.6–10.2)
CHLORIDE: 102 mmol/L (ref 96–106)
CO2: 22 mmol/L (ref 20–29)
CREATININE: 2.7 mg/dL — ABNORMAL HIGH (ref 0.76–1.27)
EGFR: 25 mL/min/{1.73_m2} — ABNORMAL LOW
GLUCOSE: 133 mg/dL — ABNORMAL HIGH (ref 65–99)
PHOSPHORUS, SERUM: 2.9 mg/dL (ref 2.8–4.1)
POTASSIUM: 4.7 mmol/L (ref 3.5–5.2)
SODIUM: 143 mmol/L (ref 134–144)

## 2020-11-12 ENCOUNTER — Ambulatory Visit: Admit: 2020-11-12 | Discharge: 2020-11-13 | Payer: MEDICARE | Attending: Nephrology | Primary: Nephrology

## 2020-11-12 DIAGNOSIS — I151 Hypertension secondary to other renal disorders: Principal | ICD-10-CM

## 2020-11-12 DIAGNOSIS — B20 Human immunodeficiency virus [HIV] disease: Principal | ICD-10-CM

## 2020-11-12 DIAGNOSIS — K7469 Other cirrhosis of liver: Principal | ICD-10-CM

## 2020-11-12 DIAGNOSIS — I2510 Atherosclerotic heart disease of native coronary artery without angina pectoris: Principal | ICD-10-CM

## 2020-11-12 DIAGNOSIS — I214 Non-ST elevation (NSTEMI) myocardial infarction: Principal | ICD-10-CM

## 2020-11-12 DIAGNOSIS — I5033 Acute on chronic diastolic (congestive) heart failure: Principal | ICD-10-CM

## 2020-11-12 DIAGNOSIS — N2889 Other specified disorders of kidney and ureter: Principal | ICD-10-CM

## 2020-11-12 DIAGNOSIS — N184 Chronic kidney disease, stage 4 (severe): Principal | ICD-10-CM

## 2020-11-12 NOTE — Unmapped (Cosign Needed)
AOBP: Left arm   Large cuff   Average: 138/76 Pulse: 63  1st reading: 136/76 Pulse: 63  2nd reading: 136/77 Pulse: 63  3rd reading: 142/76 Pulse: 62

## 2020-11-12 NOTE — Unmapped (Signed)
Continue current medications  Return in 3 months

## 2020-11-14 LAB — COLOGUARD: COLOGUARD: NEGATIVE

## 2020-11-22 NOTE — Unmapped (Signed)
[facility] Refill Coordination Note    Specialty Medication(s) to be Shipped:   Infectious Disease: Biktarvy    Other medication(s) to be shipped: No additional medications requested for fill at this time     Samuel [name], DOB: [birth year]  Phone: [phone] (home)       All above HIPAA information was verified with patient.     Was a Nurse, learning disability used for this call? No    Completed refill call assessment today to schedule patient's medication shipment from the [facility] [phone]).  All relevant notes have been reviewed.     Specialty medication(s) and dose(s) confirmed: Regimen is correct and unchanged.   Changes to medications: Samuel [name] reports no changes at this time.  Changes to insurance: No  New side effects reported not previously addressed with a pharmacist or physician: None reported  Questions for the pharmacist: Yes: can biktarvy cause headache or dizziness- transferred to [facility] dave.    Confirmed patient received a Conservation officer, historic buildings and a Surveyor, mining with first shipment. The patient will receive a drug information handout for each medication shipped and additional FDA Medication Guides as required.       DISEASE/MEDICATION-SPECIFIC INFORMATION        N/A    SPECIALTY MEDICATION ADHERENCE     Medication Adherence    Patient reported X missed doses in the last month: 0  Specialty Medication: biktarvy              Were doses missed due to medication being on hold? No    Unable to confirm quantity on hand    REFERRAL TO PHARMACIST     Referral to the pharmacist: Yes - clinical question- transferred to [facility]ph dave      SHIPPING     Shipping address confirmed in Epic.     Delivery Scheduled: Yes, Expected medication delivery date: 7/1.     Medication will be delivered via Next Day Courier to the prescription address in Epic WAM.    Samuel [name]   [facility] Specialty Technician

## 2020-11-28 DIAGNOSIS — B20 Human immunodeficiency virus [HIV] disease: Principal | ICD-10-CM

## 2020-11-28 MED ORDER — DESCOVY 200 MG-25 MG TABLET
ORAL_TABLET | Freq: Every day | ORAL | 11 refills | 30 days | Status: CP
Start: 2020-11-28 — End: ?
  Filled 2020-12-31: qty 30, 30d supply, fill #0

## 2020-11-28 MED ORDER — DOLUTEGRAVIR 50 MG TABLET
ORAL_TABLET | Freq: Every day | ORAL | 11 refills | 30.00000 days | Status: CP
Start: 2020-11-28 — End: ?
  Filled 2020-12-31: qty 30, 30d supply, fill #0

## 2020-11-28 MED FILL — BIKTARVY 50 MG-200 MG-25 MG TABLET: ORAL | 30 days supply | Qty: 30 | Fill #4

## 2020-11-29 NOTE — Unmapped (Signed)
Error

## 2020-11-29 NOTE — Unmapped (Signed)
Pt reported via MyChart that he has continued to have headaches after switch to [facility] and wants to go back to previous Descovy/dolutegravir. I called him to discuss this. He has Descovy/dolutegravir at home. I advised him to stop Biktarvy (last took it yesterday) and restart Descovy/dolutegravir today. I also told him that I sent a script for Decovy/DTG to  [facility]. He agrees.

## 2020-12-11 DIAGNOSIS — N186 End stage renal disease: Principal | ICD-10-CM

## 2020-12-11 DIAGNOSIS — B20 Human immunodeficiency virus [HIV] disease: Principal | ICD-10-CM

## 2020-12-11 DIAGNOSIS — K7460 Unspecified cirrhosis of liver: Principal | ICD-10-CM

## 2020-12-11 NOTE — Unmapped (Signed)
Patient requested labs to be done through [facility] as he is unable to keep his appointment today

## 2020-12-27 NOTE — Unmapped (Addendum)
Onboarding for the following:  ?? Descovy 200-25mg   ?? Tivicay 50mg     [facility]   Patient Onboarding/Medication Counseling    Samuel [name] is a 70 y.o. male with HIV who I am counseling today on continuation of therapy.  I am speaking to the patient.    Was a Nurse, learning disability used for this call? No    Verified patient's date of birth / HIPAA.    Specialty medication(s) to be sent: Infectious Disease: Descovy and Tivicay      Non-specialty medications/supplies to be sent: n/a      Medications not needed at this time: n/a         Descovy (emtricitabine and tenofovir alafenamide)    The patient declined counseling on medication administration, missed dose instructions, goals of therapy, side effects and monitoring parameters, warnings and precautions, drug/food interactions and storage, handling precautions, and disposal because they have taken the medication previously. The information in the declined sections below are for informational purposes only and was not discussed with patient.       Medication & Administration     Dosage: Take 1 tablet by mouth daily    Administration: Take with or without food    Adherence/Missed dose instructions: take missed dose as soon as you remember. If it is close to the time of your next dose, skip the dose and resume with your next scheduled dose.    Goals of Therapy     Keep HIV levels non-detectable on lab tests    Side Effects & Monitoring Parameters   Common Side Effects:  ??? Upset stomach  ??? Diarrhea    The following side effects should be reported to the provider:  ?? Signs of an allergic reaction, like rash; hives; itching; red, swollen, blistered, or peeling skin with or without fever; wheezing; tightness in the chest or throat; trouble breathing, swallowing, or talking; unusual hoarseness; or swelling of the mouth, face, lips, tongue, or throat.   ?? Signs of kidney problems like unable to pass urine, change in how much urine is passed, blood in the urine, or a big weight gain.  ?? Signs of liver problems like dark urine, feeling tired, not hungry, upset stomach or stomach pain, light-colored stools, throwing up, or yellow skin or eyes.   ?? Signs of too much lactic acid in the blood (lactic acidosis) like fast breathing, fast heartbeat, a heartbeat that does not feel normal, very bad upset stomach or throwing up, feeling very sleepy, shortness of breath, feeling very tired or weak, very bad dizziness, feeling cold, or muscle pain or cramps  ?? Weight gain    Monitoring Parameters:   - Serum creatinine  - Urine glucose  - Urine protein (prior to or when initiating therapy and as clinically indicated during therapy);  - Serum phosphorus (in patients with chronic kidney disease)  - Hepatic function tests  - Testing for hepatitis B virus (HBV) is recommended prior to or when initiating antiretroviral therapy.  - Patients with HIV and HBV coinfection should be monitored for several months following therapy discontinuation.  - CD4 count  - HIV RNA plasma levels     Contraindications, Warnings, & Precautions     ?? Signs and symptoms of immune reconstitution syndrome  ?? Signs and symptoms of lactic acidosis  ?? Hepatomegaly  ?? Steatosis  ?? Renal toxicity    Drug/Food Interactions     ??? Medication list reviewed in Epic. The patient was instructed to inform the  care team before taking any new medications or supplements. No drug interactions identified.     Storage, Handling Precautions, & Disposal     ?? Store this medication at room temperature.   ?? Store in the original container   ?? Keep lid tightly closed.   ?? Store in a dry place. Do not store in a bathroom.   ?? Keep all drugs in a safe place. Keep all drugs out of the reach of children and pets.   ?? Throw away unused or expired drugs. Do not flush down a toilet or pour down a drain unless you are told to do so. Check with your pharmacist if you have questions about the best way to throw out drugs. There may be drug take-back programs in your area    Tivicay (dolutegravir)    The patient declined counseling on medication administration, missed dose instructions, goals of therapy, side effects and monitoring parameters, warnings and precautions, drug/food interactions and storage, handling precautions, and disposal because they have taken the medication previously. The information in the declined sections below are for informational purposes only and was not discussed with patient.       Medication & Administration     Dosage: Take one tablet (50mg ) by mouth once daily    Administration: Take with or without food    Adherence/Missed dose instructions: take missed dose as soon as you remember. If it is close to the time of your next dose, skip the dose and resume with your next scheduled dose.    Goals of Therapy     to keep HIV levels at a non-detectable level on lab tests.    Side Effects & Monitoring Parameters     Common Side Effects:   ??? Headache  ??? Feeling tired or weak  ??? Trouble sleeping      The following side effects should be reported to the provider:  ??? Signs of an allergic reaction, like rash; hives; itching; red, swollen, blistered, or peeling skin with or without fever; wheezing; tightness in the chest or throat; trouble breathing, swallowing, or talking; unusual hoarseness; or swelling of the mouth, face, lips, tongue, or throat  ??? Signs of liver problems like dark urine, feeling tired, not hungry, upset stomach or stomach pain, light-colored stools, throwing up, or yellow skin or eyes  ??? Fever  ??? Muscle or joint pain  ??? Mouth sores  ??? Eye irritation.   ??? Shortness of breath  ??? Feeling very tired or weak  ??? Signs of infection like fever, sore throat, weakness, cough, or shortness of breath.      Contraindications, Warnings, & Precautions   ??? Hepatotoxicity  ??? Hypersensitivity reactions: Rash, constitutional findings, and organ dysfunction (eg, liver injury) have been reported.  ??? Immune reconstitution syndrome: Patients may develop immune reconstitution syndrome resulting in the occurrence of an inflammatory response to an indolent or residual opportunistic infection during initial HIV treatment or activation of autoimmune disorders (eg, Graves disease, polymyositis, Guillain-Barr?? syndrome) later in therapy  ??? Use caution in patients with renal or hepatic impairment    Drug/Food Interactions   ??? Medication list reviewed in Epic. The patient was instructed to inform the care team before taking any new medications or supplements. Drug interaction(s).   o Calcium with vitamin D supplement:  Must take Tivicay 2 hours before or 6 hours after calcium supplement or at the same time with food.  ??? Tivicay should be taken 2 hours before or 6 hours  after taking cation-containing antacids or laxatives, sucralfate, oral supplements containing iron or calcium, or buffered medications.  ??? Tivicay and supplements containing calcium or iron can be taken together with food.    Storage, Handling Precautions, & Disposal     For the 10 mg tablet: Store in the original container. Do not take out the antimoisture cube or packet.    For all products:   ??? Store at room temperature in a dry place. Do not store in a bathroom.  ??? Keep lid tightly closed.  ??? Keep all drugs in a safe place  ??? Keep all drugs out of the reach of children and pets.   ??? Throw away unused or expired drugs. Do not flush down a toilet or pour down a drain unless you are told to do so. Check with your pharmacist if you have questions about the best way to throw out drugs. There may be drug take-back programs in your area.          Current Medications (including OTC/herbals), Comorbidities and Allergies     Current Outpatient Medications   Medication Sig Dispense Refill   ??? albuterol (PROVENTIL HFA;VENTOLIN HFA) 90 mcg/actuation inhaler Inhale 2 puffs. Take as needed     ??? allopurinoL (ZYLOPRIM) 100 MG tablet Take 250 mg by mouth daily.      ??? amLODIPine (NORVASC) 10 MG tablet Take 1 tablet (10 mg total) by mouth daily. 90 tablet 3   ??? aspirin (ECOTRIN) 81 MG tablet daily.   0   ??? atenoloL (TENORMIN) 100 MG tablet Take 1 tablet (100 mg total) by mouth daily. 90 tablet 3   ??? atorvastatin (LIPITOR) 40 MG tablet      ??? BACLOFEN, BULK, TOP Apply topically. Baclofen 2%, Diclofenac 5%, Gabapentin 6%, Tetracaine 3%    Take as needed     ??? blood sugar diagnostic (ONETOUCH VERIO TEST STRIPS) Strp Use 2 (two) times daily E11.22     ??? calcium carbonate-vitamin D2 500 mg(1,250mg ) -200 unit tablet Take 1 tablet by mouth Two (2) times a day.      ??? carvediloL (COREG) 12.5 MG tablet 12.5 mg Two (2) times a day.      ??? cholecalciferol, vitamin D3 25 mcg, 1,000 units,, 1,000 unit (25 mcg) tablet Take by mouth daily.     ??? cloNIDine HCL (CATAPRES) 0.1 MG tablet Take 1 tablet (0.1 mg total) by mouth 3 (three) times a day. 270 tablet 3   ??? diazepam (VALIUM) 5 MG tablet Take 5 mg by mouth nightly.      ??? dicyclomine (BENTYL) 10 mg capsule Take 10 mg by mouth two (2) times a day as needed.      ??? docusate sodium (COLACE) 100 MG capsule Take 1 capsule (100 mg total) by mouth every twelve (12) hours. (Patient taking differently: Take 100 mg by mouth every twelve (12) hours. Take as needed) 60 capsule 0   ??? dolutegravir (TIVICAY) 50 mg TABLET Take 1 tablet (50 mg total) by mouth in the morning. 30 tablet 11   ??? dorzolamide-timoloL (COSOPT) 22.3-6.8 mg/mL ophthalmic solution Administer 1 drop to both eyes Two (2) times a day.      ??? doxazosin (CARDURA) 4 MG tablet Take 1 tablet (4 mg total) by mouth nightly. 90 tablet 3   ??? emtricitabine-tenofovir alafen (DESCOVY) 200-25 mg tablet Take 1 tablet by mouth in the morning. 30 tablet 11   ??? ferrous sulfate 325 (65 FE) MG tablet Take 325 mg  by mouth daily with breakfast.      ??? folic acid (FOLVITE) 1 MG tablet   0   ??? gabapentin (NEURONTIN) 300 MG capsule 300 mg Two (2) times a day.      ??? glimepiride (AMARYL) 4 MG tablet Take 1 tablet (4 mg total) by mouth daily. 90 tablet 3 ??? hydrALAZINE (APRESOLINE) 100 MG tablet Take 1 tablet (100 mg total) by mouth Two (2) times a day. 180 tablet 3   ??? HYDROcodone-acetaminophen (NORCO) 5-325 mg per tablet Directions are take one tablet 30 minutes before physical therapy, then take one tablet at bedtime as needed for pain     ??? lisinopriL (PRINIVIL,ZESTRIL) 40 MG tablet Take 1 tablet (40 mg total) by mouth daily. 90 tablet 3   ??? meTOLAzone (ZAROXOLYN) 10 MG tablet Take 1 tablet (10 mg total) by mouth daily. 90 tablet 3   ??? metroNIDAZOLE (FLAGYL) 500 MG tablet Take 500 mg by mouth.     ??? nitroglycerin (NITROSTAT) 0.4 MG SL tablet DISSOLVE ONE TABLET UNDER TONGUE AS NEEDED FOR CHEST PAIN EVERY 5 MINUTES AS DIRECTED 25 tablet 0   ??? nystatin (MYCOSTATIN) 100,000 unit/mL suspension Take 5 mL by mouth daily.      ??? ondansetron (ZOFRAN) 4 MG tablet take 1 tablet by mouth every 8 hours if needed for nausea     ??? oxyCODONE-acetaminophen (PERCOCET) 5-325 mg per tablet Take 1 tablet by mouth daily.      ??? ROCKLATAN 0.02-0.005 % Drop INT 1 GTT INTO OU QHS     ??? sertraline (ZOLOFT) 50 MG tablet take 1 tablet by mouth once daily     ??? SITagliptin (JANUVIA) 50 MG tablet Take 1 tablet (50 mg total) by mouth daily. 90 tablet 3   ??? spironolactone (ALDACTONE) 25 MG tablet Take 1 tablet (25 mg total) by mouth daily. 90 tablet 3   ??? tamsulosin (FLOMAX) 0.4 mg capsule TAKE 1 CAPSULE(0.4 MG) BY MOUTH DAILY 90 capsule 3   ??? torsemide (DEMADEX) 20 MG tablet Take 2 tablets (40 mg total) by mouth daily. 90 tablet 3     No current facility-administered medications for this visit.       Allergies   Allergen Reactions   ??? Colchicine Analogues Diarrhea     Have diarrhea when taken for long periods of time   ??? Tramadol Other (See Comments)     unknown tolerates morphine       Patient Active Problem List   Diagnosis   ??? Human immunodeficiency virus (HIV) disease (CMS-HCC)   ??? HCV (hepatitis C virus)   ??? Hypertension   ??? Gout   ??? Biological false positive RPR test   ??? Chronic kidney disease (CKD) stage G4/A2, severely decreased glomerular filtration rate (GFR) between 15-29 mL/min/1.73 square meter and albuminuria creatinine ratio between 30-299 mg/g (CMS-HCC)   ??? Renal mass   ??? NSTEMI (non-ST elevated myocardial infarction) (CMS-HCC)   ??? Neurological deficit present   ??? HIV (human immunodeficiency virus infection) (CMS-HCC)   ??? Cirrhosis (CMS-HCC)   ??? Abnormal EKG   ??? Acute encephalopathy   ??? Acute on chronic diastolic CHF (congestive heart failure) (CMS-HCC)   ??? Anemia   ??? Bradycardia   ??? CAD in native artery   ??? Chest pain   ??? Chronic hepatitis C without hepatic coma (CMS-HCC)       Reviewed and up to date in Epic.    Appropriateness of Therapy     Acute infections noted  within Epic:  No active infections  Patient reported infection: None    Is medication and dose appropriate based on diagnosis and infection status? Yes    Prescription has been clinically reviewed: Yes      Baseline Quality of Life Assessment      How many days over the past month did your HIV  keep you from your normal activities? For example, brushing your teeth or getting up in the morning. 0    Financial Information     Medication Assistance provided: [employer]    Anticipated copay of $0.00 for Tivicay and $0.00 for Descovy reviewed with patient. Verified delivery address.    Delivery Information     Scheduled delivery date: 12/31/20    Expected start date: continuation of existing therapy    Medication will be delivered via Same Day Courier to the prescription address in [facility].  This shipment will not require a signature.      Explained the services we provide at [facility] and that each month we would call to set up refills.  Stressed importance of returning phone calls so that we could ensure they receive their medications in time each month.  Informed patient that we should be setting up refills 7-10 days prior to when they will run out of medication.  A pharmacist will reach out to perform a clinical assessment periodically.  Informed patient that a welcome packet, containing information about our pharmacy and other support services, a Notice of Privacy Practices, and a drug information handout will be sent.      The patient or caregiver noted above participated in the development of this care plan and knows that they can request review of or adjustments to the care plan at any time.      Patient or caregiver verbalized understanding of the above information as well as how to contact the pharmacy at [phone] option 4 with any questions/concerns.  The pharmacy is open Monday through Friday 8:30am-4:30pm.  A pharmacist is available 24/7 via pager to answer any clinical questions they may have.    Patient Specific Needs     - Does the patient have any physical, cognitive, or cultural barriers? No    - Does the patient have adequate living arrangements? (i.e. the ability to store and take their medication appropriately) Yes    - Did you identify any home environmental safety or security hazards? No    - Patient prefers to have medications discussed with  Patient     - Is the patient or caregiver able to read and understand education materials at a high school level or above? Yes    - Patient's primary language is  English     - Is the patient high risk? No    - Does the patient require physician intervention or other additional services (i.e. dietary/nutrition, smoking cessation, social work)? No      Roderic Palau  [facility] Specialty Pharmacist

## 2021-01-10 LAB — BASIC METABOLIC PANEL
BLOOD UREA NITROGEN: 26 mg/dL (ref 8–27)
BUN / CREAT RATIO: 10 (ref 10–24)
CALCIUM: 9.6 mg/dL (ref 8.6–10.2)
CHLORIDE: 105 mmol/L (ref 96–106)
CO2: 22 mmol/L (ref 20–29)
CREATININE: 2.62 mg/dL — ABNORMAL HIGH (ref 0.76–1.27)
EGFR: 26 mL/min/{1.73_m2} — ABNORMAL LOW
GLUCOSE: 112 mg/dL — ABNORMAL HIGH (ref 65–99)
POTASSIUM: 4.7 mmol/L (ref 3.5–5.2)
SODIUM: 142 mmol/L (ref 134–144)

## 2021-01-10 LAB — LYMPHOCYTE MARKERS LIMITED
% CD 3 POS. LYMPH.: 79.6 % (ref 57.5–86.2)
% NK (CD56/16): 17 % (ref 1.4–19.4)
AB NK (CD56): 391 /uL (ref 24–406)
ABSOLUTE CD 3: 1831 /uL (ref 622–2402)
ABSOLUTE CD8 CNT: 966 /uL — ABNORMAL HIGH (ref 109–897)
BANDED NEUTROPHILS ABSOLUTE COUNT: 0.1 10*3/uL (ref 0.0–0.1)
BASOPHILS ABSOLUTE COUNT: 0 10*3/uL (ref 0.0–0.2)
BASOPHILS RELATIVE PERCENT: 0 %
CD4 % HELPER T CELL: 37.4 % (ref 30.8–58.5)
CD4 T CELL ABSOLUTE: 860 /uL (ref 359–1519)
CD4:CD8 RATIO: 0.89 — ABNORMAL LOW (ref 0.92–3.72)
CD8 % SUPPRESSOR T CELL: 42 % — ABNORMAL HIGH (ref 12.0–35.5)
EOSINOPHILS ABSOLUTE COUNT: 0.2 10*3/uL (ref 0.0–0.4)
EOSINOPHILS RELATIVE PERCENT: 2 %
HEMATOCRIT: 40.1 % (ref 37.5–51.0)
HEMOGLOBIN: 13.6 g/dL (ref 13.0–17.7)
IMMATURE GRANULOCYTES: 1 %
LYMPHOCYTES ABSOLUTE COUNT: 2.3 10*3/uL (ref 0.7–3.1)
LYMPHOCYTES RELATIVE PERCENT: 22 %
MEAN CORPUSCULAR HEMOGLOBIN CONC: 33.9 g/dL (ref 31.5–35.7)
MEAN CORPUSCULAR HEMOGLOBIN: 31.5 pg (ref 26.6–33.0)
MEAN CORPUSCULAR VOLUME: 93 fL (ref 79–97)
MONOCYTES ABSOLUTE COUNT: 0.7 10*3/uL (ref 0.1–0.9)
MONOCYTES RELATIVE PERCENT: 7 %
NEUTROPHILS ABSOLUTE COUNT: 6.9 10*3/uL (ref 1.4–7.0)
NEUTROPHILS RELATIVE PERCENT: 68 %
PLATELET COUNT: 157 10*3/uL (ref 150–450)
RED BLOOD CELL COUNT: 4.32 x10E6/uL (ref 4.14–5.80)
RED CELL DISTRIBUTION WIDTH: 13.4 % (ref 11.6–15.4)
WHITE BLOOD CELL COUNT: 10.1 10*3/uL (ref 3.4–10.8)

## 2021-01-10 LAB — ALT: ALT (SGPT): 66 IU/L — ABNORMAL HIGH (ref 0–44)

## 2021-01-10 LAB — AST: AST (SGOT): 45 IU/L — ABNORMAL HIGH (ref 0–40)

## 2021-01-10 LAB — HIV RNA, QUANTITATIVE, PCR: HIV RNA: <40 {copies}/mL

## 2021-01-10 LAB — BILIRUBIN, TOTAL: BILIRUBIN TOTAL: 0.5 mg/dL (ref 0.0–1.2)

## 2021-01-23 NOTE — Unmapped (Addendum)
[facility] Clinical Assessment & Refill Coordination Note    Samuel [name], DOB: 12-11-1950  Phone: [phone] (home)     All above HIPAA information was verified with patient.     Was a Nurse, learning disability used for this call? No    Specialty Medication(s):   Infectious Disease: Descovy and Tivicay     Current Outpatient Medications   Medication Sig Dispense Refill   ??? albuterol (PROVENTIL HFA;VENTOLIN HFA) 90 mcg/actuation inhaler Inhale 2 puffs. Take as needed     ??? allopurinoL (ZYLOPRIM) 100 MG tablet Take 250 mg by mouth daily.      ??? amLODIPine (NORVASC) 10 MG tablet Take 1 tablet (10 mg total) by mouth daily. 90 tablet 3   ??? aspirin (ECOTRIN) 81 MG tablet daily.   0   ??? atenoloL (TENORMIN) 100 MG tablet Take 1 tablet (100 mg total) by mouth daily. 90 tablet 3   ??? atorvastatin (LIPITOR) 40 MG tablet      ??? BACLOFEN, BULK, TOP Apply topically. Baclofen 2%, Diclofenac 5%, Gabapentin 6%, Tetracaine 3%    Take as needed (Patient not taking: Reported on 12/27/2020)     ??? blood sugar diagnostic (ONETOUCH VERIO TEST STRIPS) Strp Use 2 (two) times daily E11.22     ??? calcium carbonate-vitamin D2 500 mg(1,250mg ) -200 unit tablet Take 1 tablet by mouth Two (2) times a day.  (Patient not taking: Reported on 12/27/2020)     ??? carvediloL (COREG) 12.5 MG tablet 12.5 mg Two (2) times a day.      ??? cholecalciferol, vitamin D3 25 mcg, 1,000 units,, 1,000 unit (25 mcg) tablet Take by mouth daily. (Patient not taking: Reported on 12/27/2020)     ??? cloNIDine HCL (CATAPRES) 0.1 MG tablet Take 1 tablet (0.1 mg total) by mouth 3 (three) times a day. 270 tablet 3   ??? diazepam (VALIUM) 5 MG tablet Take 5 mg by mouth nightly.      ??? dicyclomine (BENTYL) 10 mg capsule Take 10 mg by mouth two (2) times a day as needed.      ??? docusate sodium (COLACE) 100 MG capsule Take 1 capsule (100 mg total) by mouth every twelve (12) hours. (Patient taking differently: Take 100 mg by mouth every twelve (12) hours. Take as needed) 60 capsule 0   ??? dolutegravir (TIVICAY) 50 mg TABLET Take 1 tablet (50 mg total) by mouth daily. 30 tablet 11   ??? dorzolamide-timoloL (COSOPT) 22.3-6.8 mg/mL ophthalmic solution Administer 1 drop to both eyes Two (2) times a day.      ??? emtricitabine-tenofovir alafen (DESCOVY) 200-25 mg tablet Take 1 tablet by mouth daily. 30 tablet 11   ??? ferrous sulfate 325 (65 FE) MG tablet Take 325 mg by mouth daily with breakfast.      ??? folic acid (FOLVITE) 1 MG tablet  (Patient not taking: Reported on 12/27/2020)  0   ??? gabapentin (NEURONTIN) 300 MG capsule 300 mg Two (2) times a day.      ??? glimepiride (AMARYL) 4 MG tablet Take 1 tablet (4 mg total) by mouth daily. 90 tablet 3   ??? HYDROcodone-acetaminophen (NORCO) 5-325 mg per tablet Directions are take one tablet 30 minutes before physical therapy, then take one tablet at bedtime as needed for pain     ??? lisinopriL (PRINIVIL,ZESTRIL) 40 MG tablet Take 1 tablet (40 mg total) by mouth daily. 90 tablet 3   ??? metroNIDAZOLE (FLAGYL) 500 MG tablet Take 500 mg by  mouth. (Patient not taking: Reported on 12/27/2020)     ??? nitroglycerin (NITROSTAT) 0.4 MG SL tablet DISSOLVE ONE TABLET UNDER TONGUE AS NEEDED FOR CHEST PAIN EVERY 5 MINUTES AS DIRECTED 25 tablet 0   ??? nystatin (MYCOSTATIN) 100,000 unit/mL suspension Take 5 mL by mouth daily.  (Patient not taking: Reported on 12/27/2020)     ??? ondansetron (ZOFRAN) 4 MG tablet take 1 tablet by mouth every 8 hours if needed for nausea     ??? oxyCODONE-acetaminophen (PERCOCET) 5-325 mg per tablet Take 1 tablet by mouth daily.  (Patient not taking: Reported on 12/27/2020)     ??? ROCKLATAN 0.02-0.005 % Drop INT 1 GTT INTO OU QHS     ??? sertraline (ZOLOFT) 50 MG tablet take 1 tablet by mouth once daily     ??? SITagliptin (JANUVIA) 50 MG tablet Take 1 tablet (50 mg total) by mouth daily. 90 tablet 3   ??? spironolactone (ALDACTONE) 25 MG tablet Take 1 tablet (25 mg total) by mouth daily. 90 tablet 3   ??? tamsulosin (FLOMAX) 0.4 mg capsule TAKE 1 CAPSULE(0.4 MG) BY MOUTH DAILY 90 capsule 3   ??? torsemide (DEMADEX) 20 MG tablet Take 2 tablets (40 mg total) by mouth daily. 90 tablet 3     No current facility-administered medications for this visit.        Changes to medications: Maahir reports no changes at this time.    Allergies   Allergen Reactions   ??? Colchicine Analogues Diarrhea     Have diarrhea when taken for long periods of time   ??? Tramadol Other (See Comments)     unknown tolerates morphine       Changes to allergies: No    SPECIALTY MEDICATION ADHERENCE     Descovy 200-25 mg: approximately 7 days of medicine on hand   Tivicay 50 mg: approximately 7 days of medicine on hand       Medication Adherence    Patient reported X missed doses in the last month: 0  Specialty Medication: Descovy 200-25mg   Patient is on additional specialty medications: Yes  Additional Specialty Medications: Tivicay 50mg   Patient Reported Additional Medication X Missed Doses in the Last Month: 0  Patient is on more than two specialty medications: No  Any gaps in refill history greater than 2 weeks in the last 3 months: no  Demonstrates understanding of importance of adherence: yes  Informant: patient  Provider-estimated medication adherence level: good  Patient is at risk for Non-Adherence: No          Specialty medication(s) dose(s) confirmed: Regimen is correct and unchanged.     Are there any concerns with adherence? No    Adherence counseling provided? Not needed    CLINICAL MANAGEMENT AND INTERVENTION      Clinical Benefit Assessment:    Do you feel the medicine is effective or helping your condition? Yes     HIV ASSOCIATED LABS:     Lab Results   Component Value Date/Time    HIVRS Detected (A) 09/04/2020 10:17 AM    HIVRS Not Detected 08/07/2020 10:44 AM    HIVRS Not Detected 01/29/2020 12:22 PM    HIVRS Not Detected 08/15/2014 11:42 AM    HIVRS Not Detected 01/03/2014 10:46 AM    HIVRS Not Detected 11/23/2012 12:53 PM    HIVCP <40 01/02/2021 10:26 AM    HIVCP <40 (H) 09/04/2020 10:17 AM    HIVCP <40 05/09/2020 03:11 PM    HIVCP <40 (H)  12/26/2019 10:24 AM    HIVCP <40 05/16/2019 02:24 PM    ACD4 735 09/04/2020 10:17 AM    ACD4 640 08/07/2020 10:44 AM    ACD4 649 01/29/2020 12:22 PM    ACD4 514 08/01/2014 09:35 AM    ACD4 430 (L) 01/03/2014 10:46 AM    ACD4 549 11/23/2012 12:53 PM       Clinical Benefit counseling provided? Labs from 09/04/20 show evidence of clinical benefit    Adverse Effects Assessment:    Are you experiencing any side effects? No    Are you experiencing difficulty administering your medicine? No    Quality of Life Assessment:    How many days over the past month did your HIV  keep you from your normal activities? For example, brushing your teeth or getting up in the morning. 0    Have you discussed this with your provider? Not needed    Acute Infection Status:    Acute infections noted within Epic:  No active infections  Patient reported infection: None    Therapy Appropriateness:    Is therapy appropriate? Yes, therapy is appropriate and should be continued    DISEASE/MEDICATION-SPECIFIC INFORMATION      N/A    PATIENT SPECIFIC NEEDS     - Does the patient have any physical, cognitive, or cultural barriers? No    - Is the patient high risk? No    - Does the patient require a Care Management Plan? No     - Does the patient require physician intervention or other additional services (i.e. nutrition, smoking cessation, social work)? No      SHIPPING     Specialty Medication(s) to be Shipped:   Infectious Disease: Descovy and Tivicay    Other medication(s) to be shipped: No additional medications requested for fill at this time     Changes to insurance: No    Delivery Scheduled: Yes, Expected medication delivery date: 01/24/21.     Medication will be delivered via Same Day Courier to the confirmed prescription address in [facility].    The patient will receive a drug information handout for each medication shipped and additional FDA Medication Guides as required.  Verified that patient has previously received a Conservation officer, historic buildings and a Surveyor, mining.    The patient or caregiver noted above participated in the development of this care plan and knows that they can request review of or adjustments to the care plan at any time.      All of the patient's questions and concerns have been addressed.    Roderic Palau   [facility] Specialty Pharmacist

## 2021-01-24 MED FILL — DESCOVY 200 MG-25 MG TABLET: ORAL | 30 days supply | Qty: 30 | Fill #1

## 2021-01-24 MED FILL — TIVICAY 50 MG TABLET: ORAL | 30 days supply | Qty: 30 | Fill #1

## 2021-01-28 NOTE — Unmapped (Signed)
Assessment/Plan:              HIV:  I reviewed chart and labs in anticipation of today's visit and went over labs with pt. He is generally doing well. Viral load detectable but <40 on current ART (Descovy/dolutegravir), which he tolerates well. (Returned to this regimen because he could not tolerate brief trial of Biktarvy b/o HA.) However, given his low GFR (estimated 15-30), will stop Descovy, and start abacavir 600 mg/d, lamivudine 100 mg/d, with continued dolutegravir 50 mg/d. RTC ~ 1 month for labs and f/u. I reviewed regimen with him. Pharmacy counseling requested.     Diarrhea: Resolved.     Obesity: Has worked with nutrition, improved his diet, and is trying to exercise.    Cirrhosis: Followed by Hepatology. However, he has not been seen in more than a year, although 6 month f/u was recommended. I have referred him back to them (Ms [facility]/Dr Piedad Climes).    SVR following Zepatier for Chronic HCV Infection (genotype 1a): Stable.    CKD Stage G4:A2-- Followed by Dr Stefano Gaul. Renal function appears stable, but given low GFR, I have switched his ART, as outlined above.    HFpEF: Followed by Cardiology.    HTN: Stable.    Sexual Health:     Mental Health: Stable.    Health maintenance:   Reports COVID vaccine (Moderna) x 2 at [facility] at least 2 mos ago (~07/2019). Booster ~03/2020. Reports another booster mid-Feb for total of 4 doses -- all Moderna.  Pap N/A  RPR False + (1:2 with neg FTA 11/12/2015)  GC/CT - neg 08/26/2016  Hb A1C 01/29/2020: 5.8   Cholesterol See below  TB screening neg, 01/29/2020  Lung cancer screening N/A (quit 1993 after 1 ppd x 10 yrs)  Colonoscopy - To be done by Dr Marcello Fennel          Prevention, adherence and health education:   .  .  .  .  .  .  .    Educational and counseling services took 25  minutes of today's visit.    Follow-up:  Return to clinic in 1 month or sooner if needed.   Subjective:    Samuel [name] is a 70 y.o. male who presents to the Infectious Disease clinic for return HIV visit.     Chief Complaint: HIV Positive/AIDS      HPI:   HIV diagnosed in 12/1999. In 07/13/00, CD4 count was 547 (25%) with a viral load of about 5293. For a number of years, his viral load and CD4 count were generally reasonably well maintained in the absence of antiretroviral therapy, and he never had any HIV-related symptoms. However, during 2008-2009, his viral load dramatically increased and his CD4 count fell. Viral load reached a height of 309,000 on 08/31/2007 and CD4 count fell substantially to 244 on 09/29/2007. He had always been reluctant to take antiviral medications and in fact consistently and repeatedly refused hepatitis C treatment as well as HIV treatment in the past. However, because of the deterioration of his CD4 count and viral load, I prescribed Atripla on 10/12/07. Although he agreed to take it he did not done so until more than a year later, despite assurances during each visit that he would begin ART immediately. Once he finally started ART, however, he tolerated it quite well. On 07/24/2015 he was switched from Atripla to Descovy/dolutegravir b/o CKD, and on 08/07/2020 he was switched to [facility] to further simplify regimen. Switched back to Descovy/DTG  on 11/28/20 b/o persistent HAs on Bictarvy.     His HIV has been reasonably well controlled (in recent months, detectable but <40), but he has multiple severe comorbidities, including HCV (SVR after 12 wks of Zepatier 04/20/2016-07/13/2016; HCV RNA BDL 10/28/2016), resultant cirrhosis (Metavir F4), CKD now Stage 4:A2, NSTEMI 10/2015. He is followed closely by Nephrology. Also followed by GI, Cardiology, and PCP (Dr Marcello Fennel). He has been evaluated for renal transplantation.    Still working on exercise. Still gets 3-6K steps per day on Apple Watch.    Still has back pain, but injections help.     ROS otherwise unremarkable.         Past Medical History:   Diagnosis Date   ??? CKD (chronic kidney disease) stage 3, GFR 30-59 ml/min (CMS-HCC) 01/08/2015   ??? Coronary artery disease 10/2015    Multivessel disease, plan medical management after cath 6/17   ??? GERD (gastroesophageal reflux disease)    ??? Gout    ??? HIV (human immunodeficiency virus infection) (CMS-HCC)     Undectable viral load 5/17   ??? Hypertension    ??? Nephrolithiasis    ??? Nephrotic syndrome 03/05/2014   ??? Renal mass     Followed by neprhology, MRI 8/16 improved, felt to be benign       Medications:  Current Outpatient Medications   Medication Sig Dispense Refill   ??? albuterol (PROVENTIL HFA;VENTOLIN HFA) 90 mcg/actuation inhaler Inhale 2 puffs. Take as needed     ??? allopurinoL (ZYLOPRIM) 100 MG tablet Take 250 mg by mouth daily.      ??? amLODIPine (NORVASC) 10 MG tablet Take 1 tablet (10 mg total) by mouth daily. 90 tablet 3   ??? aspirin (ECOTRIN) 81 MG tablet daily.   0   ??? atenoloL (TENORMIN) 100 MG tablet Take 1 tablet (100 mg total) by mouth daily. 90 tablet 3   ??? atorvastatin (LIPITOR) 40 MG tablet      ??? blood sugar diagnostic (ONETOUCH VERIO TEST STRIPS) Strp Use 2 (two) times daily E11.22     ??? calcium carbonate-vitamin D2 500 mg(1,250mg ) -200 unit tablet Take 1 tablet by mouth Two (2) times a day.     ??? carvediloL (COREG) 12.5 MG tablet 12.5 mg Two (2) times a day.      ??? cloNIDine HCL (CATAPRES) 0.1 MG tablet Take 1 tablet (0.1 mg total) by mouth 3 (three) times a day. 270 tablet 3   ??? diazepam (VALIUM) 5 MG tablet Take 5 mg by mouth nightly.      ??? dicyclomine (BENTYL) 10 mg capsule Take 10 mg by mouth two (2) times a day as needed.      ??? docusate sodium (COLACE) 100 MG capsule Take 1 capsule (100 mg total) by mouth every twelve (12) hours. (Patient taking differently: Take 100 mg by mouth every twelve (12) hours. Take as needed) 60 capsule 0   ??? dorzolamide-timoloL (COSOPT) 22.3-6.8 mg/mL ophthalmic solution Administer 1 drop to both eyes Two (2) times a day.      ??? ferrous sulfate 325 (65 FE) MG tablet Take 325 mg by mouth daily with breakfast.      ??? folic acid (FOLVITE) 1 MG tablet   0   ??? gabapentin (NEURONTIN) 300 MG capsule 300 mg Two (2) times a day.      ??? glimepiride (AMARYL) 4 MG tablet Take 1 tablet (4 mg total) by mouth daily. 90 tablet 3   ??? HYDROcodone-acetaminophen (NORCO) 5-325 mg  per tablet Directions are take one tablet 30 minutes before physical therapy, then take one tablet at bedtime as needed for pain     ??? lisinopriL (PRINIVIL,ZESTRIL) 40 MG tablet Take 1 tablet (40 mg total) by mouth daily. 90 tablet 3   ??? nystatin (MYCOSTATIN) 100,000 unit/mL suspension Take 5 mL by mouth daily.     ??? ondansetron (ZOFRAN) 4 MG tablet take 1 tablet by mouth every 8 hours if needed for nausea     ??? oxyCODONE-acetaminophen (PERCOCET) 5-325 mg per tablet Take 1 tablet by mouth in the morning.     ??? ROCKLATAN 0.02-0.005 % Drop INT 1 GTT INTO OU QHS     ??? sertraline (ZOLOFT) 50 MG tablet take 1 tablet by mouth once daily     ??? SITagliptin (JANUVIA) 50 MG tablet Take 1 tablet (50 mg total) by mouth daily. 90 tablet 3   ??? spironolactone (ALDACTONE) 25 MG tablet Take 1 tablet (25 mg total) by mouth daily. 90 tablet 3   ??? tamsulosin (FLOMAX) 0.4 mg capsule TAKE 1 CAPSULE(0.4 MG) BY MOUTH DAILY 90 capsule 3   ??? torsemide (DEMADEX) 20 MG tablet Take 2 tablets (40 mg total) by mouth daily. 90 tablet 3   ??? BACLOFEN, BULK, TOP Apply topically. Baclofen 2%, Diclofenac 5%, Gabapentin 6%, Tetracaine 3%    Take as needed (Patient not taking: No sig reported)     ??? cholecalciferol, vitamin D3 25 mcg, 1,000 units,, 1,000 unit (25 mcg) tablet Take by mouth daily. (Patient not taking: Reported on 12/27/2020)     ??? metroNIDAZOLE (FLAGYL) 500 MG tablet Take 500 mg by mouth. (Patient not taking: Reported on 12/27/2020)     ??? nitroglycerin (NITROSTAT) 0.4 MG SL tablet DISSOLVE ONE TABLET UNDER TONGUE AS NEEDED FOR CHEST PAIN EVERY 5 MINUTES AS DIRECTED 25 tablet 0     No current facility-administered medications for this visit.       Allergies: Colchicine analogues and Tramadol    Social History:  As per MEDICAL RECORD NUMBERNo cigs or alcohol at all. No current IDU (quit many years ago). Lives with wife.     Review of Systems:  A 12 point review of systems was negative except for pertinent items noted in the HPI.    Objective:       BP 137/74 (BP Site: L Arm, BP Position: Sitting, BP Cuff Size: Large)  - Pulse 53  - Temp 36.7 ??C (98 ??F) (Oral)  - Wt 98.4 kg (217 lb)  - BMI 30.27 kg/m??     GEN:  looks well, no apparent distress  PSYCH:attentive, appropriate affect, good eye contact, fluent speech   LN: No cervical or axillary LAD  LUNGS: Clear  CV: Nl s1, s2 without s3, s4, +1/6sys m at LLSB and RUSB  ABD: Nl BS, nontender, without HSM  EXT: No edema    Recent Labs:    Lab Results   Component Value Date    RPR Nonreactive 01/29/2020    RPR Nonreactive 06/08/2018    GCNAA Negative 06/08/2018    CTNAA Negative 06/08/2018    A1C 5.8 (H) 01/29/2020    A1C 6.8 (H) 06/08/2018    A1C 5.7 11/12/2015    CHOL 88 01/29/2020    CHOL 111 09/16/2016    CHOL 148 11/12/2015    LDL 27 (L) 01/29/2020    LDL 39 (L) 09/16/2016    LDL 80 11/12/2015    HDL 23 (L) 01/29/2020    HDL 32 (  L) 09/16/2016    HDL 37 (L) 11/12/2015    TRIG 188 (H) 01/29/2020    TRIG 198 (H) 09/16/2016    TRIG 155 (H) 11/12/2015    CHOLHDLRATIO 3.8 01/29/2020    CHOLHDLRATIO 3.5 09/16/2016    CHOLHDLRATIO 4.0 11/12/2015    QFTTBGOLD Negative 01/29/2020         Absolute CD4 Count   Date Value Ref Range Status   09/04/2020 735 510-2,320 /uL Final   08/07/2020 640 510-2,320 /uL Final   01/29/2020 649 510-2,320 /uL Final   12/26/2019 736 510-2,320 /uL Final   08/01/2014 514 510 - 2,320 /uL Final   01/03/2014 430 (L) 510 - 2,320 /uL Final   11/23/2012 549 510 - 2,320 /uL Final   07/20/2012 470 (L) 510 - 2,320 CELLS/UL Final     CD4% (T Helper)   Date Value Ref Range Status   09/04/2020 35 34 - 58 % Final   08/07/2020 32 (L) 34 - 58 % Final   01/29/2020 31 (L) 34 - 58 % Final   12/26/2019 32 (L) 34 - 58 % Final   08/01/2014 35 34 - 58 % Final   01/03/2014 34 34 - 58 % Final   11/23/2012 31 (L) 34 - 58 % Final   07/20/2012 29 (L) 34 - 58 % OF LYMPHS Final     HIV RNA Quant Result   Date Value Ref Range Status   09/04/2020 Detected (A) Not Detected Final   08/07/2020 Not Detected Not Detected Final   01/29/2020 Not Detected Not Detected Final   12/26/2019 Detected (A) Not Detected Final   08/15/2014 Not Detected  Final   01/03/2014 Not Detected  Final   11/23/2012 Not Detected  Final   07/20/2012 Not Detected  Final     HIV RNA   Date Value Ref Range Status   01/02/2021 <40 copies/mL Final     Comment:     HIV-1 RNA not detected  The reportable range for this assay is 40 to 10,000,000  copies HIV-1 RNA/mL.     09/04/2020 <40 (H) <0 copies/mL Final   05/09/2020 <40 copies/mL Final     Comment:     HIV-1 RNA not detected  The reportable range for this assay is 40 to 10,000,000  copies HIV-1 RNA/mL.     12/26/2019 <40 (H) <0 copies/mL Final   05/16/2019 <40 copies/mL Final     Comment:     HIV-1 RNA not detected  The reportable range for this assay is 40 to 10,000,000  copies HIV-1 RNA/mL.     09/12/2018 <40 copies/mL Final     Comment:     HIV-1 RNA not detected  The reportable range for this assay is 40 to 10,000,000  copies HIV-1 RNA/mL.       HIV RNA Log(10)   Date Value Ref Range Status   01/02/2021 CANCELED log10copy/mL      Comment:     Unable to calculate result since non-numeric result obtained for  component test.    Result canceled by the ancillary.     09/04/2020   Final     Comment:     <1.6 log   05/09/2020 CANCELED log10copy/mL      Comment:     Unable to calculate result since non-numeric result obtained for  component test.    Result canceled by the ancillary.     12/26/2019   Final     Comment:     <1.6  log   05/16/2019 CANCELED log10copy/mL      Comment:     Unable to calculate result since non-numeric result obtained for  component test.    Result canceled by the ancillary.     09/12/2018 CANCELED log10copy/mL      Comment:     Unable to calculate result since non-numeric result obtained for  component test.    Result canceled by the ancillary.           Absolute CD4 Count   Date Value Ref Range Status   09/04/2020 735 510-2,320 /uL Final   08/07/2020 640 510-2,320 /uL Final   01/29/2020 649 510-2,320 /uL Final   12/26/2019 736 510-2,320 /uL Final   08/01/2014 514 510 - 2,320 /uL Final   01/03/2014 430 (L) 510 - 2,320 /uL Final   11/23/2012 549 510 - 2,320 /uL Final   07/20/2012 470 (L) 510 - 2,320 CELLS/UL Final     HIV RNA   Date Value Ref Range Status   01/02/2021 <40 copies/mL Final     Comment:     HIV-1 RNA not detected  The reportable range for this assay is 40 to 10,000,000  copies HIV-1 RNA/mL.     09/04/2020 <40 (H) <0 copies/mL Final   05/09/2020 <40 copies/mL Final     Comment:     HIV-1 RNA not detected  The reportable range for this assay is 40 to 10,000,000  copies HIV-1 RNA/mL.     12/26/2019 <40 (H) <0 copies/mL Final   05/16/2019 <40 copies/mL Final     Comment:     HIV-1 RNA not detected  The reportable range for this assay is 40 to 10,000,000  copies HIV-1 RNA/mL.     09/12/2018 <40 copies/mL Final     Comment:     HIV-1 RNA not detected  The reportable range for this assay is 40 to 10,000,000  copies HIV-1 RNA/mL.        Lab Results   Component Value Date    WBC 10.1 01/02/2021    HGB 13.6 01/02/2021    Platelet 157 01/02/2021    Creatinine 2.62 (H) 01/02/2021    AST 45 (H) 01/02/2021    ALT 66 (H) 01/02/2021    Triglycerides 188 (H) 01/29/2020    Triglycerides 80 01/03/2014    HDL 23 (L) 01/29/2020    HDL 53 01/03/2014    LDL Calculated 27 (L) 01/29/2020    LDL Cholesterol, Calculated 71 01/03/2014       Immunization History   Administered Date(s) Administered   ??? COVID-19 VACCINE,MRNA(MODERNA)(PF)(IM) 08/12/2019, 03/27/2020   ??? HEPB-CPG,ADULT DOSAGE ADJUVANTED, IM 01/31/2020   ??? Hepatitis B, Adult 04/06/2016, 05/21/2016, 08/24/2016   ??? INFLUENZA INJ MDCK PF, QUAD,(FLUCELVAX)(34MO AND UP EGG FREE) 02/28/2019   ??? INFLUENZA TIV (TRI) PF (IM) 02/08/2008, 03/26/2010, 03/25/2011   ??? Influenza Vaccine Quad (IIV4 PF) 27mo+ injectable 07/24/2015, 02/05/2016, 03/08/2018, 03/05/2020   ??? Influenza Virus Vaccine, unspecified formulation 06/15/2014, 07/24/2015, 02/05/2016, 03/15/2016, 03/31/2017   ??? PNEUMOCOCCAL POLYSACCHARIDE 23 07/13/2000, 08/19/2005, 12/01/2017   ??? PPD Test 12/29/2006, 07/11/2008, 03/26/2010, 03/25/2011   ??? Pneumococcal Conjugate 13-Valent 04/13/2012   ??? SHINGRIX-ZOSTER VACCINE (HZV), RECOMBINANT,SUB-UNIT,ADJUVANTED IM 09/16/2016, 12/01/2017   ??? TdaP 08/31/2007, 08/04/2010, 10/27/2018   ??? Tuberculin Skin Test;unspecified Formulation 12/29/2006, 07/11/2008, 03/26/2010, 03/25/2011

## 2021-01-29 ENCOUNTER — Ambulatory Visit
Admit: 2021-01-29 | Discharge: 2021-01-30 | Payer: MEDICARE | Attending: Infectious Disease | Primary: Infectious Disease

## 2021-01-29 DIAGNOSIS — B20 Human immunodeficiency virus [HIV] disease: Principal | ICD-10-CM

## 2021-01-29 LAB — BILIRUBIN, TOTAL: BILIRUBIN TOTAL: 1 mg/dL (ref 0.3–1.2)

## 2021-01-29 LAB — CBC W/ AUTO DIFF
BASOPHILS ABSOLUTE COUNT: 0 10*9/L (ref 0.0–0.1)
BASOPHILS RELATIVE PERCENT: 0.4 %
EOSINOPHILS ABSOLUTE COUNT: 0.2 10*9/L (ref 0.0–0.5)
EOSINOPHILS RELATIVE PERCENT: 1.4 %
HEMATOCRIT: 39.6 % (ref 39.0–48.0)
HEMOGLOBIN: 13.5 g/dL (ref 12.9–16.5)
LYMPHOCYTES ABSOLUTE COUNT: 1.7 10*9/L (ref 1.1–3.6)
LYMPHOCYTES RELATIVE PERCENT: 14.7 %
MEAN CORPUSCULAR HEMOGLOBIN CONC: 34.1 g/dL (ref 32.0–36.0)
MEAN CORPUSCULAR HEMOGLOBIN: 32.3 pg (ref 25.9–32.4)
MEAN CORPUSCULAR VOLUME: 94.7 fL (ref 77.6–95.7)
MEAN PLATELET VOLUME: 9.1 fL (ref 6.8–10.7)
MONOCYTES ABSOLUTE COUNT: 0.8 10*9/L (ref 0.3–0.8)
MONOCYTES RELATIVE PERCENT: 6.8 %
NEUTROPHILS ABSOLUTE COUNT: 8.9 10*9/L — ABNORMAL HIGH (ref 1.8–7.8)
NEUTROPHILS RELATIVE PERCENT: 76.7 %
NUCLEATED RED BLOOD CELLS: 0 /100{WBCs} (ref <=4)
PLATELET COUNT: 137 10*9/L — ABNORMAL LOW (ref 150–450)
RED BLOOD CELL COUNT: 4.18 10*12/L — ABNORMAL LOW (ref 4.26–5.60)
RED CELL DISTRIBUTION WIDTH: 13.6 % (ref 12.2–15.2)
WBC ADJUSTED: 11.6 10*9/L — ABNORMAL HIGH (ref 3.6–11.2)

## 2021-01-29 LAB — BASIC METABOLIC PANEL
ANION GAP: 4 mmol/L — ABNORMAL LOW (ref 5–14)
BLOOD UREA NITROGEN: 21 mg/dL (ref 9–23)
BUN / CREAT RATIO: 8
CALCIUM: 10 mg/dL (ref 8.7–10.4)
CHLORIDE: 107 mmol/L (ref 98–107)
CO2: 28.7 mmol/L (ref 20.0–31.0)
CREATININE: 2.63 mg/dL — ABNORMAL HIGH
EGFR CKD-EPI (2021) MALE: 26 mL/min/{1.73_m2} — ABNORMAL LOW (ref >=60)
GLUCOSE RANDOM: 100 mg/dL (ref 70–179)
POTASSIUM: 4.7 mmol/L (ref 3.4–4.8)
SODIUM: 140 mmol/L (ref 135–145)

## 2021-01-29 LAB — LYMPH MARKER LIMITED,FLOW
ABSOLUTE CD3 CNT: 1241 {cells}/uL (ref 915–3400)
ABSOLUTE CD4 CNT: 578 {cells}/uL (ref 510–2320)
ABSOLUTE CD8 CNT: 646 {cells}/uL (ref 180–1520)
CD3% (T CELLS): 73 % (ref 61–86)
CD4% (T HELPER): 34 % (ref 34–58)
CD4:CD8 RATIO: 0.9 (ref 0.9–4.8)
CD8% T SUPPRESR: 38 % (ref 12–38)

## 2021-01-29 LAB — ALT: ALT (SGPT): 97 U/L — ABNORMAL HIGH (ref 10–49)

## 2021-01-29 LAB — AST: AST (SGOT): 53 U/L — ABNORMAL HIGH (ref <=34)

## 2021-01-29 MED ORDER — DOLUTEGRAVIR 50 MG TABLET
ORAL_TABLET | Freq: Every day | ORAL | 11 refills | 30 days | Status: CP
Start: 2021-01-29 — End: ?

## 2021-01-29 MED ORDER — LAMIVUDINE 100 MG TABLET
ORAL_TABLET | Freq: Every day | ORAL | 11 refills | 30.00000 days | Status: CP
Start: 2021-01-29 — End: 2022-01-29
  Filled 2021-01-31: qty 30, 30d supply, fill #0

## 2021-01-29 MED ORDER — ABACAVIR 300 MG TABLET
ORAL_TABLET | Freq: Every day | ORAL | 11 refills | 30 days | Status: CP
Start: 2021-01-29 — End: ?
  Filled 2021-01-31: qty 60, 30d supply, fill #0

## 2021-01-29 NOTE — Unmapped (Incomplete)
MEDICATION COUNSELING    Called Samuel [name] to counsel on new HIV regimen, abacavir 600 mg, lamivudine 100 mg, and dolutegravir 50 mg (previously on Descovy/dolutegravir, switched for low eGFR).     Counseling points to be covered:  Administration: 3 tablets, each once daily with or without food. Separate dolutegravir from calcium and iron supplement by taking 2 hours before or 6 hours after supplement, or can take together with food.  Adverse effects: hypersensitivity (checking allele to determine risk, in progress), weight gain, insomnia (both associated with dolutegravir, not new to regimen), headaches, upset stomach      Time spent: *** minutes    Derrel Nip, PharmD  PGY2 Ambulatory Care Pharmacy Resident

## 2021-01-29 NOTE — Unmapped (Addendum)
PLEASE, WHEN ABACAVIR AND LAMIVUDINE COME:  START ABACAVIR 600 MG DAILY AND LAMIVUDINE 100 MG DAILY  CONTINUE DOLUTEGRAVIR (TIVICAY) 50 MG DAILY  STOP DESCOVY    RETURN TO ME IN ID CLINIC IN ABOUT 1 MONTH    ALSO RETURN TO HEPATOLOGY (LIVER CLINIC)

## 2021-01-30 ENCOUNTER — Encounter
Admit: 2021-01-30 | Discharge: 2021-01-30 | Payer: MEDICARE | Attending: Infectious Disease | Primary: Infectious Disease

## 2021-01-30 DIAGNOSIS — B20 Human immunodeficiency virus [HIV] disease: Principal | ICD-10-CM

## 2021-01-30 NOTE — Unmapped (Signed)
[facility] Specialty Medication Onboarding    Specialty Medication: abacavir 300 mg tablet (ZIAGEN)  Prior Authorization: Not Required   Financial Assistance: No - copay  <$25  Final Copay/Day Supply: $16 / 30    Insurance Restrictions: None     Notes to Pharmacist: n/a    The triage team has completed the benefits investigation and has determined that the patient is able to fill this medication at [facility] Please contact the patient to complete the onboarding or follow up with the prescribing physician as needed.    [facility] Specialty Medication Onboarding    Specialty Medication: lamiVUDine 100 MG tablet (EPIVIR)  Prior Authorization: Not Required   Financial Assistance: No - copay  <$25  Final Copay/Day Supply: $12.88 / 30    Insurance Restrictions: None     Notes to Pharmacist: n/a    The triage team has completed the benefits investigation and has determined that the patient is able to fill this medication at [facility]. Please contact the patient to complete the onboarding or follow up with the prescribing physician as needed.

## 2021-01-30 NOTE — Unmapped (Signed)
This onboarding is for the following medications:   ?? Abacavir 300mg   ?? Lamivudine 100mg     [facility]   Patient Onboarding/Medication Counseling    Samuel [name] is a 70 y.o. male with HIV who I am counseling today on initiation of therapy.  I am speaking to the patient.    Was a Nurse, learning disability used for this call? No    Verified patient's date of birth / HIPAA.    Specialty medication(s) to be sent: Infectious Disease: Abacavir and lamivudine      Non-specialty medications/supplies to be sent: n/a      Medications not needed at this time: n/a         Ziagen (abacavir) 300mg  tablets    Medication & Administration     Dosage: Take  two tablets (600 mg) once daily along with lamivudine 100mg  and Tivicay 50mg     Screenings required prior to treatment initiation:  HLA-B*5701 allele screening:  negative    Administration: Take with or without food    Adherence/Missed dose instructions: take missed dose as soon as you remember. If it is close to the time of your next dose, skip the dose and resume with your next scheduled dose.    Goals of Therapy     The goal is to suppress HIV viral replication such that the virus is undetectable on lab tests    Side Effects & Monitoring Parameters   ??? Headache.  ??? Upset stomach or throwing up.  ??? Trouble sleeping.  ??? Bad dreams.  ??? Feeling tired or weak.        The following side effects should be reported to the provider:  ??? Signs of an allergic reaction, like rash; hives; itching; red, swollen, blistered, or peeling skin with or without fever; wheezing; tightness in the chest or throat; trouble breathing, swallowing, or talking; unusual hoarseness; or swelling of the mouth, face, lips, tongue, or throat.  ??? Signs of too much lactic acid in the blood (lactic acidosis) like fast breathing, fast heartbeat, a heartbeat that does not feel normal, very bad upset stomach or throwing up, feeling very sleepy, shortness of breath, feeling very tired or weak, very bad dizziness, feeling cold, or muscle pain or cramps.  ??? Signs of liver problems like dark urine, feeling tired, not hungry, upset stomach or stomach pain, light-colored stools, throwing up, or yellow skin or eyes.  ??? Signs of kidney problems like unable to pass urine, change in how much urine is passed, blood in the urine, or a big weight gain.  ??? Chest pain or pressure.  ??? Low mood (depression).  ??? Dizziness or passing out.  ??? Eye irritation.  ??? Mouth sores.  ??? Muscle or joint pain.  ??? Swollen gland.  ??? A burning, numbness, or tingling feeling that is not normal.  ??? Swelling.  ??? Chills.  ??? Ear irritation.  ??? Nose or throat irritation.  ??? Changes in your immune system can happen when you start taking drugs to treat HIV. If you have an infection that you did not know you had, it may show up when you take this drug. Tell your doctor right away if you have any new signs after you start this drug, even after taking it for several months. This includes signs of infection like fever, sore throat, weakness, cough, or shortness of breath.    Contraindications, Warnings, & Precautions     ??? [US Boxed Warning]: Serious and sometimes fatal hypersensitivity reactions have  occurred. Patients who carry the HLA-[id number] allele are at a higher risk for a hypersensitivity reaction to abacavir, although hypersensitivity reactions have occurred in patients who do not carry the HLA-B*5701 allele. All patients should be screened for the HLA-B*5701 allele prior to initiating or reinitiation of therapy unless patients have had a previously documented HLA-B*5701 allele assessment. Discontinue abacavir if a hypersensitivity reaction is suspected. Abacavir is contraindicated in patients who have the HLA-B*5701 allele or in patients with a prior hypersensitivity reaction to abacavir. Reintroduction of any abacavir-containing product can result in life-threatening or fatal hypersensitivity reactions, even in patients who have no history of hypersensitivity to abacavir therapy. Such reactions can occur within hours.   ??? Unsafe and sometimes deadly allergic effects with organ failure have happened with abacavir. Tell your doctor about any fever, rash, feeling tired, upset stomach, throwing up, diarrhea, stomach pain, flu-like signs, sore throat, cough, or trouble breathing. Do not restart this drug if you have had an allergic reaction.  ??? Contraindicatied in moderate to severe hepatic impairment  ??? Immune reconstitution syndrome: Patients may develop immune reconstitution syndrome resulting in the occurrence of an inflammatory response to an indolent or residual opportunistic infection during initial HIV treatment or activation of autoimmune disorders (eg, Graves disease, polymyositis, Guillain-Barr?? syndrome) later in therapy; further evaluation and treatment may be required.  ??? Lactic acidosis/hepatomegaly: Lactic acidosis and severe hepatomegaly with steatosis have been reported with nucleoside analogues, including fatal cases; use with caution in patients with risk factors for liver disease (risk may be increased with male gender or obesity) and suspend treatment in any patient who develops clinical or laboratory findings suggestive of lactic acidosis or hepatotoxicity (transaminase elevation may/may not accompany hepatomegaly and steatosis).  ??? Coronary heart disease: Use has been associated with an increased risk of MI in some cohort studies Yvonna Alanis 2016/12/01; [facility] [adult] 12/01/2017). Consider using with caution in patients with risks for coronary heart disease and minimizing modifiable risk factors (eg, hypertension, hyperlipidemia, diabetes mellitus, smoking) prior to use.    Drug/Food Interactions     ??? Medication list reviewed in Epic. The patient was instructed to inform the care team before taking any new medications or supplements. No drug interactions identified.      Storage, Handling Precautions, & Disposal   ??? Store at room temperature in a dry place. Do not store in a bathroom.  ??? Keep all drugs in a safe place. Keep all drugs out of the reach of children and pets.  ??? Throw away unused or expired drugs. Do not flush down a toilet or pour down a drain unless you are told to do so. Check with your pharmacist if you have questions about the best way to throw out drugs. There may be drug take-back programs in your area.    Epivir HBV (lamivudine) 100mg  tablets    Medication & Administration     Dosage: Take one tablet (100mg ) by mouth once daily. Take along with two abacavir 300mg  tablets and one Tivicay 50mg  tablet.    Administration: Take with or without food    Adherence/Missed dose instructions: take missed dose as soon as you remember. If it is close to the time of your next dose, skip the dose and resume with your next scheduled dose.    Goals of Therapy     The goal is to suppress viral replication and keep HIV non-detectable on lab tests    Side Effects & Monitoring Parameters     ?? Headache  ??  Diarrhea  ?? upset stomach or throwing up  ?? Feeling dizzy, tired, or weak  ?? Trouble sleeping  ?? Muscle pain  ?? Nose or throat irritation.   ?? Ear irritation.    The following side effects should be reported to the provider:    ?? Signs of an allergic reaction, like rash; hives; itching; red, swollen, blistered, or peeling skin with or without fever; wheezing; tightness in the chest or throat; trouble breathing, swallowing, or talking; unusual hoarseness; or swelling of the mouth, face, lips, tongue, or throat  ?? Signs of liver problems like dark urine, feeling tired, not hungry, upset stomach or stomach pain, light-colored stools, throwing up, or yellow skin or eyes.  ?? Signs of too much lactic acid in the blood (lactic acidosis) like fast breathing, fast heartbeat, a heartbeat that does not feel normal, very bad upset stomach or throwing up, feeling very sleepy, shortness of breath, feeling very tired or weak, very bad dizziness, feeling cold, or muscle pain or cramps  ?? Signs of a pancreas problem (pancreatitis) like very bad stomach pain, very bad back pain, or very bad upset stomach or throwing up  ?? A burning, numbness, or tingling feeling that is not normal  ?? Low mood (depression).    Monitoring Parameters    ?? Hepatic function  ?? Renal function  ?? Viral load  ?? CD4 count  ?? Test for HBV prior to therapy signs/symptoms of lactic acidosis; signs/symptoms of pancreatitis    Contraindications, Warnings, & Precautions     ?? Immune reconstitution syndrome: Patients may develop immune reconstitution syndrome resulting in the occurrence of an inflammatory response to an indolent or residual opportunistic infection during initial HIV treatment or activation of autoimmune disorders  ?? Chronic hepatitis B: [US Boxed Warning]: Severe acute exacerbations of hepatitis B (some fatal) have been reported in patients with HBV or HIV/HBV coinfection who have discontinued lamivudine; hepatic function should be monitored closely with both clinical and laboratory follow-up for at least several months after discontinuation. Initiate antihepatitis B (HBV) medications if clinically appropriate.  ?? Pancreatitis  ?? Lactic acidosis    Drug/Food Interactions     ??? Medication list reviewed in Epic. The patient was instructed to inform the care team before taking any new medications or supplements. No drug interactions identified.     Storage, Handling Precautions, & Disposal     ?? Store at room temperature in a dry place. Do not store in a bathroom.  ?? Keep lid tightly closed.   ?? Keep all drugs in a safe place. Keep all drugs out of the reach of children and pets.   ?? Throw away unused or expired drugs. Do not flush down a toilet or pour down a drain unless you are told to do so. Check with your pharmacist if you have questions about the best way to throw out drugs. There may be drug take-back programs in your area.          Current Medications (including OTC/herbals), Comorbidities and Allergies     Current Outpatient Medications   Medication Sig Dispense Refill   ??? abacavir (ZIAGEN) 300 mg tablet Take 2 tablets (600 mg total) by mouth daily. 60 tablet 11   ??? albuterol (PROVENTIL HFA;VENTOLIN HFA) 90 mcg/actuation inhaler Inhale 2 puffs. Take as needed     ??? allopurinoL (ZYLOPRIM) 100 MG tablet Take 250 mg by mouth daily.      ??? amLODIPine (NORVASC) 10 MG tablet Take 1 tablet (  10 mg total) by mouth daily. 90 tablet 3   ??? aspirin (ECOTRIN) 81 MG tablet daily.   0   ??? atenoloL (TENORMIN) 100 MG tablet Take 1 tablet (100 mg total) by mouth daily. 90 tablet 3   ??? atorvastatin (LIPITOR) 40 MG tablet      ??? BACLOFEN, BULK, TOP Apply topically. Baclofen 2%, Diclofenac 5%, Gabapentin 6%, Tetracaine 3%    Take as needed (Patient not taking: No sig reported)     ??? blood sugar diagnostic (ONETOUCH VERIO TEST STRIPS) Strp Use 2 (two) times daily E11.22     ??? calcium carbonate-vitamin D2 500 mg(1,250mg ) -200 unit tablet Take 1 tablet by mouth Two (2) times a day.     ??? carvediloL (COREG) 12.5 MG tablet 12.5 mg Two (2) times a day.      ??? cholecalciferol, vitamin D3 25 mcg, 1,000 units,, 1,000 unit (25 mcg) tablet Take by mouth daily. (Patient not taking: Reported on 12/27/2020)     ??? cloNIDine HCL (CATAPRES) 0.1 MG tablet Take 1 tablet (0.1 mg total) by mouth 3 (three) times a day. 270 tablet 3   ??? diazepam (VALIUM) 5 MG tablet Take 5 mg by mouth nightly.      ??? dicyclomine (BENTYL) 10 mg capsule Take 10 mg by mouth two (2) times a day as needed.      ??? docusate sodium (COLACE) 100 MG capsule Take 1 capsule (100 mg total) by mouth every twelve (12) hours. (Patient taking differently: Take 100 mg by mouth every twelve (12) hours. Take as needed) 60 capsule 0   ??? dolutegravir (TIVICAY) 50 mg TABLET Take 1 tablet (50 mg total) by mouth daily. 30 tablet 11   ??? dorzolamide-timoloL (COSOPT) 22.3-6.8 mg/mL ophthalmic solution Administer 1 drop to both eyes Two (2) times a day.      ??? ferrous sulfate 325 (65 FE) MG tablet Take 325 mg by mouth daily with breakfast.      ??? folic acid (FOLVITE) 1 MG tablet   0   ??? gabapentin (NEURONTIN) 300 MG capsule 300 mg Two (2) times a day.      ??? glimepiride (AMARYL) 4 MG tablet Take 1 tablet (4 mg total) by mouth daily. 90 tablet 3   ??? HYDROcodone-acetaminophen (NORCO) 5-325 mg per tablet Directions are take one tablet 30 minutes before physical therapy, then take one tablet at bedtime as needed for pain     ??? lamiVUDine (EPIVIR) 100 MG tablet Take 1 tablet (100 mg total) by mouth daily. 30 tablet 11   ??? lisinopriL (PRINIVIL,ZESTRIL) 40 MG tablet Take 1 tablet (40 mg total) by mouth daily. 90 tablet 3   ??? metroNIDAZOLE (FLAGYL) 500 MG tablet Take 500 mg by mouth. (Patient not taking: Reported on 12/27/2020)     ??? nitroglycerin (NITROSTAT) 0.4 MG SL tablet DISSOLVE ONE TABLET UNDER TONGUE AS NEEDED FOR CHEST PAIN EVERY 5 MINUTES AS DIRECTED 25 tablet 0   ??? nystatin (MYCOSTATIN) 100,000 unit/mL suspension Take 5 mL by mouth daily.     ??? ondansetron (ZOFRAN) 4 MG tablet take 1 tablet by mouth every 8 hours if needed for nausea     ??? oxyCODONE-acetaminophen (PERCOCET) 5-325 mg per tablet Take 1 tablet by mouth in the morning.     ??? ROCKLATAN 0.02-0.005 % Drop INT 1 GTT INTO OU QHS     ??? sertraline (ZOLOFT) 50 MG tablet take 1 tablet by mouth once daily     ??? SITagliptin (JANUVIA)  50 MG tablet Take 1 tablet (50 mg total) by mouth daily. 90 tablet 3   ??? spironolactone (ALDACTONE) 25 MG tablet Take 1 tablet (25 mg total) by mouth daily. 90 tablet 3   ??? tamsulosin (FLOMAX) 0.4 mg capsule TAKE 1 CAPSULE(0.4 MG) BY MOUTH DAILY 90 capsule 3   ??? torsemide (DEMADEX) 20 MG tablet Take 2 tablets (40 mg total) by mouth daily. 90 tablet 3     No current facility-administered medications for this visit.       Allergies   Allergen Reactions   ??? Colchicine Analogues Diarrhea     Have diarrhea when taken for long periods of time   ??? Tramadol Other (See Comments)     unknown tolerates morphine       Patient Active Problem List   Diagnosis   ??? Human immunodeficiency virus (HIV) disease (CMS-HCC)   ??? HCV (hepatitis C virus)   ??? Hypertension   ??? Gout   ??? Biological false positive RPR test   ??? Chronic kidney disease (CKD) stage G4/A2, severely decreased glomerular filtration rate (GFR) between 15-29 mL/min/1.73 square meter and albuminuria creatinine ratio between 30-299 mg/g (CMS-HCC)   ??? Renal mass   ??? NSTEMI (non-ST elevated myocardial infarction) (CMS-HCC)   ??? Neurological deficit present   ??? HIV (human immunodeficiency virus infection) (CMS-HCC)   ??? Cirrhosis (CMS-HCC)   ??? Abnormal EKG   ??? Acute encephalopathy   ??? Acute on chronic diastolic CHF (congestive heart failure) (CMS-HCC)   ??? Anemia   ??? Bradycardia   ??? CAD in native artery   ??? Chest pain   ??? Chronic hepatitis C without hepatic coma (CMS-HCC)       Reviewed and up to date in Epic.    Appropriateness of Therapy     Acute infections noted within Epic:  No active infections  Patient reported infection: None    Is medication and dose appropriate based on diagnosis and infection status? Yes    Prescription has been clinically reviewed: Yes      Baseline Quality of Life Assessment      How many days over the past month did your HIV  keep you from your normal activities? For example, brushing your teeth or getting up in the morning. 0    Financial Information     Medication Assistance provided: [facility] Assistance    Anticipated copay of $0.00 for abacavir and $0.00 for lamivudine reviewed with patient.    Verified delivery address.    Delivery Information     Scheduled delivery date: 01/31/21    Expected start date: 01/31/21    Medication will be delivered via Same Day Courier to the prescription address in [facility].  This shipment will not require a signature.      Explained the services we provide at [facility] and that each month we would call to set up refills.  Stressed importance of returning phone calls so that we could ensure they receive their medications in time each month.  Informed patient that we should be setting up refills 7-10 days prior to when they will run out of medication.  A pharmacist will reach out to perform a clinical assessment periodically.  Informed patient that a welcome packet, containing information about our pharmacy and other support services, a Notice of Privacy Practices, and a drug information handout will be sent.      The patient or caregiver noted above participated in the development of this  care plan and knows that they can request review of or adjustments to the care plan at any time.      Patient or caregiver verbalized understanding of the above information as well as how to contact the pharmacy at [phone] option 4 with any questions/concerns.  The pharmacy is open Monday through Friday 8:30am-4:30pm.  A pharmacist is available 24/7 via pager to answer any clinical questions they may have.    Patient Specific Needs     - Does the patient have any physical, cognitive, or cultural barriers? No    - Does the patient have adequate living arrangements? (i.e. the ability to store and take their medication appropriately) Yes    - Did you identify any home environmental safety or security hazards? No    - Patient prefers to have medications discussed with  Patient     - Is the patient or caregiver able to read and understand education materials at a high school level or above? Yes    - Patient's primary language is  English     - Is the patient high risk? No    - Does the patient require physician intervention or other additional services (i.e. dietary/nutrition, smoking cessation, social work)? No      Roderic Palau  [facility] Specialty Pharmacist

## 2021-02-01 LAB — HIV RNA, QUANTITATIVE, PCR: HIV RNA QNT RSLT: NOT DETECTED

## 2021-02-04 ENCOUNTER — Ambulatory Visit: Admit: 2021-02-04 | Discharge: 2021-02-05 | Payer: MEDICARE

## 2021-02-04 LAB — HLA-B*5701: HLA-B*5701: NEGATIVE

## 2021-02-21 NOTE — Unmapped (Signed)
[facility] Clinical Assessment & Refill Coordination Note    Samuel [name], DOB: 12-01-1950  Phone: [phone] (home)     All above HIPAA information was verified with patient.     Was a Nurse, learning disability used for this call? No    Specialty Medication(s):   Infectious Disease: Abacavir and lamivudine     Current Outpatient Medications   Medication Sig Dispense Refill   ??? abacavir (ZIAGEN) 300 mg tablet Take 2 tablets (600 mg total) by mouth daily. 60 tablet 11   ??? albuterol (PROVENTIL HFA;VENTOLIN HFA) 90 mcg/actuation inhaler Inhale 2 puffs. Take as needed     ??? allopurinoL (ZYLOPRIM) 100 MG tablet Take 250 mg by mouth daily.      ??? amLODIPine (NORVASC) 10 MG tablet Take 1 tablet (10 mg total) by mouth daily. 90 tablet 3   ??? aspirin (ECOTRIN) 81 MG tablet daily.   0   ??? atenoloL (TENORMIN) 100 MG tablet Take 1 tablet (100 mg total) by mouth daily. 90 tablet 3   ??? atorvastatin (LIPITOR) 40 MG tablet      ??? BACLOFEN, BULK, TOP Apply topically. Baclofen 2%, Diclofenac 5%, Gabapentin 6%, Tetracaine 3%    Take as needed (Patient not taking: No sig reported)     ??? blood sugar diagnostic (ONETOUCH VERIO TEST STRIPS) Strp Use 2 (two) times daily E11.22     ??? calcium carbonate-vitamin D2 500 mg(1,250mg ) -200 unit tablet Take 1 tablet by mouth Two (2) times a day.     ??? carvediloL (COREG) 12.5 MG tablet 12.5 mg Two (2) times a day.      ??? cholecalciferol, vitamin D3 25 mcg, 1,000 units,, 1,000 unit (25 mcg) tablet Take by mouth daily. (Patient not taking: Reported on 12/27/2020)     ??? cloNIDine HCL (CATAPRES) 0.1 MG tablet Take 1 tablet (0.1 mg total) by mouth 3 (three) times a day. 270 tablet 3   ??? diazepam (VALIUM) 5 MG tablet Take 5 mg by mouth nightly.      ??? dicyclomine (BENTYL) 10 mg capsule Take 10 mg by mouth two (2) times a day as needed.      ??? docusate sodium (COLACE) 100 MG capsule Take 1 capsule (100 mg total) by mouth every twelve (12) hours. (Patient taking differently: Take 100 mg by mouth every twelve (12) hours. Take as needed) 60 capsule 0   ??? dolutegravir (TIVICAY) 50 mg TABLET Take 1 tablet (50 mg total) by mouth daily. 30 tablet 11   ??? dorzolamide-timoloL (COSOPT) 22.3-6.8 mg/mL ophthalmic solution Administer 1 drop to both eyes Two (2) times a day.      ??? ferrous sulfate 325 (65 FE) MG tablet Take 325 mg by mouth daily with breakfast.      ??? folic acid (FOLVITE) 1 MG tablet   0   ??? gabapentin (NEURONTIN) 300 MG capsule 300 mg Two (2) times a day.      ??? glimepiride (AMARYL) 4 MG tablet Take 1 tablet (4 mg total) by mouth daily. 90 tablet 3   ??? HYDROcodone-acetaminophen (NORCO) 5-325 mg per tablet Directions are take one tablet 30 minutes before physical therapy, then take one tablet at bedtime as needed for pain     ??? lamiVUDine (EPIVIR) 100 MG tablet Take 1 tablet (100 mg total) by mouth daily. 30 tablet 11   ??? lisinopriL (PRINIVIL,ZESTRIL) 40 MG tablet Take 1 tablet (40 mg total) by mouth daily. 90 tablet 3   ???  metroNIDAZOLE (FLAGYL) 500 MG tablet Take 500 mg by mouth. (Patient not taking: Reported on 12/27/2020)     ??? nitroglycerin (NITROSTAT) 0.4 MG SL tablet DISSOLVE ONE TABLET UNDER TONGUE AS NEEDED FOR CHEST PAIN EVERY 5 MINUTES AS DIRECTED 25 tablet 0   ??? nystatin (MYCOSTATIN) 100,000 unit/mL suspension Take 5 mL by mouth daily.     ??? ondansetron (ZOFRAN) 4 MG tablet take 1 tablet by mouth every 8 hours if needed for nausea     ??? oxyCODONE-acetaminophen (PERCOCET) 5-325 mg per tablet Take 1 tablet by mouth in the morning.     ??? ROCKLATAN 0.02-0.005 % Drop INT 1 GTT INTO OU QHS     ??? sertraline (ZOLOFT) 50 MG tablet take 1 tablet by mouth once daily     ??? SITagliptin (JANUVIA) 50 MG tablet Take 1 tablet (50 mg total) by mouth daily. 90 tablet 3   ??? spironolactone (ALDACTONE) 25 MG tablet Take 1 tablet (25 mg total) by mouth daily. 90 tablet 3   ??? tamsulosin (FLOMAX) 0.4 mg capsule TAKE 1 CAPSULE(0.4 MG) BY MOUTH DAILY 90 capsule 3   ??? torsemide (DEMADEX) 20 MG tablet Take 2 tablets (40 mg total) by mouth daily. 90 tablet 3     No current facility-administered medications for this visit.        Changes to medications: Samuel [name] reports no changes at this time.    Allergies   Allergen Reactions   ??? Colchicine Analogues Diarrhea     Have diarrhea when taken for long periods of time   ??? Tramadol Other (See Comments)     unknown tolerates morphine       Changes to allergies: No    SPECIALTY MEDICATION ADHERENCE     abacavir 300 mg: approximately 7 days of medicine on hand   lamivudine 100 mg: approximately 7 days of medicine on hand       Medication Adherence    Patient reported X missed doses in the last month: 0  Specialty Medication: Abacavir 300mg   Patient is on additional specialty medications: Yes  Additional Specialty Medications: Lamivudine 100mg   Patient Reported Additional Medication X Missed Doses in the Last Month: 0  Patient is on more than two specialty medications: No  Demonstrates understanding of importance of adherence: yes  Informant: patient  Provider-estimated medication adherence level: good  Patient is at risk for Non-Adherence: No          Specialty medication(s) dose(s) confirmed: Regimen is correct and unchanged.     Are there any concerns with adherence? No    Adherence counseling provided? Not needed    CLINICAL MANAGEMENT AND INTERVENTION      Clinical Benefit Assessment:    Do you feel the medicine is effective or helping your condition? Yes    Clinical Benefit counseling provided? Not needed    Adverse Effects Assessment:    Are you experiencing any side effects? Yes. He is having problems with diarrhea and has been taking loperamide.  He has talked to Dr. Tomasa Hose and has an appointment at the clinic on 02/26/21    Are you experiencing difficulty administering your medicine? No    Quality of Life Assessment:    How many days over the past month did your HIV  keep you from your normal activities? For example, brushing your teeth or getting up in the morning. 0    Have you discussed this with your provider? Not needed    Acute Infection Status:  Acute infections noted within Epic:  No active infections  Patient reported infection: None    Therapy Appropriateness:    Is therapy appropriate and patient progressing towards therapeutic goals? Yes, therapy is appropriate and should be continued    DISEASE/MEDICATION-SPECIFIC INFORMATION      N/A    PATIENT SPECIFIC NEEDS     - Does the patient have any physical, cognitive, or cultural barriers? No    - Is the patient high risk? No    - Does the patient require a Care Management Plan? No     - Does the patient require physician intervention or other additional services (i.e. nutrition, smoking cessation, social work)? No      SHIPPING     Specialty Medication(s) to be Shipped:   Infectious Disease: Abacavir and lamivudine    Other medication(s) to be shipped: No additional medications requested for fill at this time     Changes to insurance: No    Delivery Scheduled: Yes, Expected medication delivery date: 02/25/21.     Medication will be delivered via Next Day Courier to the confirmed prescription address in [facility].    The patient will receive a drug information handout for each medication shipped and additional FDA Medication Guides as required.  Verified that patient has previously received a Conservation officer, historic buildings and a Surveyor, mining.    The patient or caregiver noted above participated in the development of this care plan and knows that they can request review of or adjustments to the care plan at any time.      All of the patient's questions and concerns have been addressed.    Roderic Palau   [facility] Specialty Pharmacist

## 2021-02-24 MED FILL — LAMIVUDINE 100 MG TABLET: ORAL | 30 days supply | Qty: 30 | Fill #1

## 2021-02-24 MED FILL — ABACAVIR 300 MG TABLET: ORAL | 30 days supply | Qty: 60 | Fill #1

## 2021-02-25 NOTE — Unmapped (Signed)
Assessment/Plan:              HIV:  I reviewed chart and labs in anticipation of today's visit and went over labs with pt. He is generally doing well. Will continue ABC/3TC/DTG and check toxicity labs today. However, given his concerns about loose stools and decreased energy, I would like to see him again in another month or so to verify that he's adjusting to this regimen. Flu vacc given today. Will get COVID bivalent from [facility] or other local pharmacy.     Obesity: Has worked with nutrition, improved his diet, and is trying to exercise.    Cirrhosis: Followed by Hepatology. However, he has not been seen in more than a year, although 6 month f/u was recommended. I have referred him back to them ([facility]/Dr Piedad Climes). I put in an order for referral on 01/29/2021 but as of today order has still not been released, so I've asked him to call clinic himself (as [facility]) and request an appointment.    SVR following Zepatier for Chronic HCV Infection (genotype 1a): Stable.    CKD Stage G4:A2-- Followed by Dr Stefano Gaul. Renal function appears stable.    HFpEF: Followed by Cardiology.    HTN: Stable.    Sexual Health:     Mental Health: Stable.    Health maintenance:   Reports COVID vaccine (Moderna) x 2 at [facility] at least 2 mos ago (~07/2019). Booster ~03/2020. Reports another booster mid-Feb for total of 4 doses -- all Moderna.  Pap N/A  RPR False + (1:2 with neg FTA 11/12/2015)  GC/CT - neg 08/26/2016  Hb A1C 01/29/2020: 5.8   Cholesterol See below  TB screening neg, 01/29/2020  Lung cancer screening N/A (quit 1993 after 1 ppd x 10 yrs)  Colonoscopy - To be done by Dr Marcello Fennel          Prevention, adherence and health education:   .  .  .  .  .  .  .    Educational and counseling services took 25  minutes of today's visit.    Follow-up:  Return to clinic in 1 month or sooner if needed.   Subjective:    Samuel [name] is a 70 y.o. male who presents to the Infectious Disease clinic for return HIV visit.     Chief Complaint: HIV Positive/AIDS (New medication causing diarrhea)      HPI:   HIV diagnosed in 12/1999. In 07/13/00, CD4 count was 547 (25%) with a viral load of about 5293. For a number of years, his viral load and CD4 count were generally reasonably well maintained in the absence of antiretroviral therapy, and he never had any HIV-related symptoms. However, during 2008-2009, his viral load dramatically increased and his CD4 count fell. Viral load reached a height of 309,000 on 08/31/2007 and CD4 count fell substantially to 244 on 09/29/2007. He had always been reluctant to take antiviral medications and in fact consistently and repeatedly refused hepatitis C treatment as well as HIV treatment in the past. However, because of the deterioration of his CD4 count and viral load, I prescribed Atripla on 10/12/07. Although he agreed to take it he did not done so until more than a year later, despite assurances during each visit that he would begin ART immediately. Once he finally started ART, however, he tolerated it quite well. On 07/24/2015 he was switched from Atripla to Descovy/dolutegravir b/o CKD, and on 08/07/2020 he was switched  to Biktarvy to further simplify regimen. Switched back to Descovy/DTG on 11/28/20 b/o persistent HAs on Bictarvy. Switched again on 01/29/2021 to ABC/3TC 100 daily/DTG b/o low GFR.      New regimen feels different. Had two loose stools per day on new ART regimen of ABC/3TC/DTG, so he has been taking Imodium twice daily, which controls this. Has less energy and no longer walks; he attributes this to his new regimen. Has missed no doses is willing to stick with it.      ROS otherwise unremarkable.       I personally spent 30 minutes face-to-face and non-face-to-face in the care of this patient, which includes all pre, intra, and post visit time on the date of service.  Past Medical History:   Diagnosis Date   ??? CKD (chronic kidney disease) stage 3, GFR 30-59 ml/min (CMS-HCC) 01/08/2015   ??? Coronary artery disease 10/2015    Multivessel disease, plan medical management after cath 6/17   ??? GERD (gastroesophageal reflux disease)    ??? Gout    ??? HIV (human immunodeficiency virus infection) (CMS-HCC)     Undectable viral load 5/17   ??? Hypertension    ??? Nephrolithiasis    ??? Nephrotic syndrome 03/05/2014   ??? Renal mass     Followed by neprhology, MRI 8/16 improved, felt to be benign       Medications:  Current Outpatient Medications   Medication Sig Dispense Refill   ??? abacavir (ZIAGEN) 300 mg tablet Take 2 tablets (600 mg total) by mouth daily. 60 tablet 11   ??? albuterol (PROVENTIL HFA;VENTOLIN HFA) 90 mcg/actuation inhaler Inhale 2 puffs. Take as needed     ??? allopurinoL (ZYLOPRIM) 100 MG tablet Take 250 mg by mouth daily.      ??? amLODIPine (NORVASC) 10 MG tablet Take 1 tablet (10 mg total) by mouth daily. 90 tablet 3   ??? aspirin (ECOTRIN) 81 MG tablet daily.   0   ??? atorvastatin (LIPITOR) 40 MG tablet      ??? BACLOFEN, BULK, TOP Apply topically. Baclofen 2%, Diclofenac 5%, Gabapentin 6%, Tetracaine 3%    Take as needed     ??? blood sugar diagnostic (ONETOUCH VERIO TEST STRIPS) Strp Use 2 (two) times daily E11.22     ??? calcium carbonate-vitamin D2 500 mg(1,250mg ) -200 unit tablet Take 1 tablet by mouth Two (2) times a day.     ??? carvediloL (COREG) 12.5 MG tablet 12.5 mg Two (2) times a day.      ??? cholecalciferol, vitamin D3 25 mcg, 1,000 units,, 1,000 unit (25 mcg) tablet Take by mouth daily.     ??? diazepam (VALIUM) 5 MG tablet Take 5 mg by mouth nightly.      ??? dicyclomine (BENTYL) 10 mg capsule Take 10 mg by mouth two (2) times a day as needed.      ??? docusate sodium (COLACE) 100 MG capsule Take 1 capsule (100 mg total) by mouth every twelve (12) hours. (Patient taking differently: Take 100 mg by mouth every twelve (12) hours. Take as needed) 60 capsule 0   ??? dolutegravir (TIVICAY) 50 mg TABLET Take 1 tablet (50 mg total) by mouth daily. 30 tablet 11   ??? dorzolamide-timoloL (COSOPT) 22.3-6.8 mg/mL ophthalmic solution Administer 1 drop to both eyes Two (2) times a day.      ??? ferrous sulfate 325 (65 FE) MG tablet Take 325 mg by mouth daily with breakfast.      ??? folic acid (FOLVITE)  1 MG tablet   0   ??? gabapentin (NEURONTIN) 300 MG capsule 300 mg Two (2) times a day.      ??? HYDROcodone-acetaminophen (NORCO) 5-325 mg per tablet Directions are take one tablet 30 minutes before physical therapy, then take one tablet at bedtime as needed for pain     ??? lamiVUDine (EPIVIR) 100 MG tablet Take 1 tablet (100 mg total) by mouth daily. 30 tablet 11   ??? metroNIDAZOLE (FLAGYL) 500 MG tablet Take 500 mg by mouth.     ??? nitroglycerin (NITROSTAT) 0.4 MG SL tablet DISSOLVE ONE TABLET UNDER TONGUE AS NEEDED FOR CHEST PAIN EVERY 5 MINUTES AS DIRECTED 25 tablet 0   ??? nystatin (MYCOSTATIN) 100,000 unit/mL suspension Take 5 mL by mouth daily.     ??? ondansetron (ZOFRAN) 4 MG tablet take 1 tablet by mouth every 8 hours if needed for nausea     ??? oxyCODONE-acetaminophen (PERCOCET) 5-325 mg per tablet Take 1 tablet by mouth in the morning.     ??? ROCKLATAN 0.02-0.005 % Drop INT 1 GTT INTO OU QHS     ??? sertraline (ZOLOFT) 50 MG tablet take 1 tablet by mouth once daily     ??? tamsulosin (FLOMAX) 0.4 mg capsule TAKE 1 CAPSULE(0.4 MG) BY MOUTH DAILY 90 capsule 3   ??? atenoloL (TENORMIN) 100 MG tablet Take 1 tablet (100 mg total) by mouth daily. 90 tablet 3   ??? cloNIDine HCL (CATAPRES) 0.1 MG tablet Take 1 tablet (0.1 mg total) by mouth 3 (three) times a day. 270 tablet 3   ??? glimepiride (AMARYL) 4 MG tablet Take 1 tablet (4 mg total) by mouth daily. 90 tablet 3   ??? lisinopriL (PRINIVIL,ZESTRIL) 40 MG tablet Take 1 tablet (40 mg total) by mouth daily. 90 tablet 3   ??? SITagliptin (JANUVIA) 50 MG tablet Take 1 tablet (50 mg total) by mouth daily. 90 tablet 3   ??? spironolactone (ALDACTONE) 25 MG tablet Take 1 tablet (25 mg total) by mouth daily. 90 tablet 3   ??? torsemide (DEMADEX) 20 MG tablet Take 2 tablets (40 mg total) by mouth daily. 90 tablet 3     No current facility-administered medications for this visit.       Allergies: Colchicine analogues and Tramadol    Social History:  As per MEDICAL RECORD NUMBERNo cigs or alcohol at all. No current IDU (quit many years ago). Lives with wife.     Review of Systems:  A 12 point review of systems was negative except for pertinent items noted in the HPI.    Objective:       BP 148/86  - Pulse 73  - Temp 36.6 ??C (97.8 ??F) (Oral)  - Ht 180.3 cm (5' 11)  - Wt 96.8 kg (213 lb 6.4 oz)  - BMI 29.76 kg/m??     GEN:  looks well, no apparent distress  PSYCH:attentive, appropriate affect, good eye contact, fluent speech   LN: No cervical or axillary LAD  LUNGS: Clear  CV: Nl s1, s2 without s3, s4, +1/6sys m at LLSB and RUSB  ABD: Nl BS, nontender, without HSM  EXT: No edema    Recent Labs:    Lab Results   Component Value Date    RPR Nonreactive 01/29/2020    RPR Nonreactive 06/08/2018    GCNAA Negative 06/08/2018    CTNAA Negative 06/08/2018    A1C 5.8 (H) 01/29/2020    A1C 6.8 (H) 06/08/2018    A1C 5.7  11/12/2015    CHOL 88 01/29/2020    CHOL 111 09/16/2016    CHOL 148 11/12/2015    LDL 27 (L) 01/29/2020    LDL 39 (L) 09/16/2016    LDL 80 11/12/2015    HDL 23 (L) 01/29/2020    HDL 32 (L) 09/16/2016    HDL 37 (L) 11/12/2015    TRIG 188 (H) 01/29/2020    TRIG 198 (H) 09/16/2016    TRIG 155 (H) 11/12/2015    CHOLHDLRATIO 3.8 01/29/2020    CHOLHDLRATIO 3.5 09/16/2016    CHOLHDLRATIO 4.0 11/12/2015    QFTTBGOLD Negative 01/29/2020         Absolute CD4 Count   Date Value Ref Range Status   01/29/2021 578 510 - 2,320 /uL Final   09/04/2020 735 510-2,320 /uL Final   08/07/2020 640 510-2,320 /uL Final   01/29/2020 649 510-2,320 /uL Final   08/01/2014 514 510 - 2,320 /uL Final   01/03/2014 430 (L) 510 - 2,320 /uL Final   11/23/2012 549 510 - 2,320 /uL Final   07/20/2012 470 (L) 510 - 2,320 CELLS/UL Final     CD4% (T Helper)   Date Value Ref Range Status   01/29/2021 34 34 - 58 % Final   09/04/2020 35 34 - 58 % Final 08/07/2020 32 (L) 34 - 58 % Final   01/29/2020 31 (L) 34 - 58 % Final   08/01/2014 35 34 - 58 % Final   01/03/2014 34 34 - 58 % Final   11/23/2012 31 (L) 34 - 58 % Final   07/20/2012 29 (L) 34 - 58 % OF LYMPHS Final     HIV RNA Quant Result   Date Value Ref Range Status   01/29/2021 Not Detected Not Detected Final   09/04/2020 Detected (A) Not Detected Final   08/07/2020 Not Detected Not Detected Final   01/29/2020 Not Detected Not Detected Final   08/15/2014 Not Detected  Final   01/03/2014 Not Detected  Final   11/23/2012 Not Detected  Final   07/20/2012 Not Detected  Final     HIV RNA   Date Value Ref Range Status   01/02/2021 <40 copies/mL Final     Comment:     HIV-1 RNA not detected  The reportable range for this assay is 40 to 10,000,000  copies HIV-1 RNA/mL.     09/04/2020 <40 (H) <0 copies/mL Final   05/09/2020 <40 copies/mL Final     Comment:     HIV-1 RNA not detected  The reportable range for this assay is 40 to 10,000,000  copies HIV-1 RNA/mL.     12/26/2019 <40 (H) <0 copies/mL Final   05/16/2019 <40 copies/mL Final     Comment:     HIV-1 RNA not detected  The reportable range for this assay is 40 to 10,000,000  copies HIV-1 RNA/mL.     09/12/2018 <40 copies/mL Final     Comment:     HIV-1 RNA not detected  The reportable range for this assay is 40 to 10,000,000  copies HIV-1 RNA/mL.       HIV RNA Log(10)   Date Value Ref Range Status   01/02/2021 CANCELED log10copy/mL      Comment:     Unable to calculate result since non-numeric result obtained for  component test.    Result canceled by the ancillary.     09/04/2020   Final     Comment:     <1.6 log   05/09/2020  CANCELED log10copy/mL      Comment:     Unable to calculate result since non-numeric result obtained for  component test.    Result canceled by the ancillary.     12/26/2019   Final     Comment:     <1.6 log   05/16/2019 CANCELED log10copy/mL      Comment:     Unable to calculate result since non-numeric result obtained for  component test.    Result canceled by the ancillary.     09/12/2018 CANCELED log10copy/mL      Comment:     Unable to calculate result since non-numeric result obtained for  component test.    Result canceled by the ancillary.           Absolute CD4 Count   Date Value Ref Range Status   01/29/2021 578 510 - 2,320 /uL Final   09/04/2020 735 510-2,320 /uL Final   08/07/2020 640 510-2,320 /uL Final   01/29/2020 649 510-2,320 /uL Final   08/01/2014 514 510 - 2,320 /uL Final   01/03/2014 430 (L) 510 - 2,320 /uL Final   11/23/2012 549 510 - 2,320 /uL Final   07/20/2012 470 (L) 510 - 2,320 CELLS/UL Final     HIV RNA   Date Value Ref Range Status   01/02/2021 <40 copies/mL Final     Comment:     HIV-1 RNA not detected  The reportable range for this assay is 40 to 10,000,000  copies HIV-1 RNA/mL.     09/04/2020 <40 (H) <0 copies/mL Final   05/09/2020 <40 copies/mL Final     Comment:     HIV-1 RNA not detected  The reportable range for this assay is 40 to 10,000,000  copies HIV-1 RNA/mL.     12/26/2019 <40 (H) <0 copies/mL Final   05/16/2019 <40 copies/mL Final     Comment:     HIV-1 RNA not detected  The reportable range for this assay is 40 to 10,000,000  copies HIV-1 RNA/mL.     09/12/2018 <40 copies/mL Final     Comment:     HIV-1 RNA not detected  The reportable range for this assay is 40 to 10,000,000  copies HIV-1 RNA/mL.        Lab Results   Component Value Date    WBC 11.6 (H) 01/29/2021    WBC 10.1 01/02/2021    HGB 13.5 01/29/2021    HGB 13.6 01/02/2021    Platelet 137 (L) 01/29/2021    Platelet 157 01/02/2021    Creatinine 2.63 (H) 01/29/2021    Creatinine 2.62 (H) 01/02/2021    AST 53 (H) 01/29/2021    AST 45 (H) 01/02/2021    ALT 97 (H) 01/29/2021    ALT 66 (H) 01/02/2021    Triglycerides 188 (H) 01/29/2020    Triglycerides 80 01/03/2014    HDL 23 (L) 01/29/2020    HDL 53 01/03/2014    LDL Calculated 27 (L) 01/29/2020    LDL Cholesterol, Calculated 71 01/03/2014       Immunization History   Administered Date(s) Administered   ??? COVID-19 VACCINE,MRNA(MODERNA)(PF)(IM) 08/12/2019, 03/27/2020   ??? HEPB-CPG,ADULT DOSAGE ADJUVANTED, IM 01/31/2020   ??? Hepatitis B, Adult 04/06/2016, 05/21/2016, 08/24/2016   ??? INFLUENZA INJ MDCK PF, QUAD,(FLUCELVAX)(4MO AND UP EGG FREE) 02/28/2019   ??? INFLUENZA TIV (TRI) PF (IM) 02/08/2008, 03/26/2010, 03/25/2011   ??? Influenza Vaccine Quad (IIV4 PF) 57mo+ injectable 07/24/2015, 02/05/2016, 03/08/2018, 03/05/2020   ??? Influenza Virus Vaccine, unspecified  formulation 06/15/2014, 07/24/2015, 02/05/2016, 03/15/2016, 03/31/2017   ??? PNEUMOCOCCAL POLYSACCHARIDE 23 07/13/2000, 08/19/2005, 12/01/2017   ??? PPD Test 12/29/2006, 07/11/2008, 03/26/2010, 03/25/2011   ??? Pneumococcal Conjugate 13-Valent 04/13/2012   ??? SHINGRIX-ZOSTER VACCINE (HZV), RECOMBINANT,SUB-UNIT,ADJUVANTED IM 09/16/2016, 12/01/2017   ??? TdaP 08/31/2007, 08/04/2010, 10/27/2018   ??? Tuberculin Skin Test;unspecified Formulation 12/29/2006, 07/11/2008, 03/26/2010, 03/25/2011

## 2021-02-25 NOTE — Unmapped (Addendum)
HISTORY OF PRESENT ILLNESS:      [name] is a 70 year old man with stage G4:A2 chronic kidney disease who is evaluated today for stage G4:A2 chronic kidney disease. He continues to have relatively stable kidney function. He saw Dr. Tomasa Hose last week, who changed his HAART to abacavir, dolutegravir, and lamivudine.  He has not had chest pain.  He notes recurrence of diarrhea over the past 7 to 10 days.  He indicates that he did respond to a brief course of metronidazole 3 months ago for presumed bacterial overgrowth.  His recent labs are significant for a slight increase in transaminases.  He has follow-up with hepatology in the near future.  He does not have increased leg edema.  He is trying to increase his exercise.  Home blood pressure readings are averaging about 130/80 mmHg.     MEDICATIONS:    Medication Sig   ??? abacavir (ZIAGEN) 300 mg tablet Take 2 tablets (600 mg total) by mouth daily.   ??? albuterol (PROVENTIL HFA;VENTOLIN HFA) 90 mcg/actuation inhaler Inhale 2 puffs. Take as needed   ??? allopurinoL (ZYLOPRIM) 100 MG tablet Take 250 mg by mouth daily.    ??? amLODIPine (NORVASC) 10 MG tablet Take 1 tablet (10 mg total) by mouth daily.   ??? aspirin (ECOTRIN) 81 MG tablet daily.    ??? atenoloL (TENORMIN) 100 MG tablet Take 1 tablet (100 mg total) by mouth daily.   ??? atorvastatin (LIPITOR) 40 MG tablet    ??? BACLOFEN, BULK, TOP Apply topically. Baclofen 2%, Diclofenac 5%, Gabapentin 6%, Tetracaine 3%    Take as needed   ??? calcium carbonate-vitamin D2 500 mg(1,250mg ) -200 unit tablet Take 1 tablet by mouth Two (2) times a day.   ??? carvediloL (COREG) 12.5 MG tablet 12.5 mg Two (2) times a day.    ??? cholecalciferol, vitamin D3 25 mcg, 1,000 units,, 1,000 unit (25 mcg) tablet Take by mouth daily.   ??? cloNIDine HCL (CATAPRES) 0.1 MG tablet Take 1 tablet (0.1 mg total) by mouth 3 (three) times a day.   ??? diazepam (VALIUM) 5 MG tablet Take 5 mg by mouth nightly.    ??? dicyclomine (BENTYL) 10 mg capsule Take 10 mg by mouth two (2) times a day as needed.    ??? docusate sodium (COLACE) 100 MG capsule Take 1 capsule (100 mg total) by mouth every twelve (12) hours. (Patient taking differently: Take 100 mg by mouth every twelve (12) hours. Take as needed)   ??? dolutegravir (TIVICAY) 50 mg TABLET Take 1 tablet (50 mg total) by mouth daily.   ??? dorzolamide-timoloL (COSOPT) 22.3-6.8 mg/mL ophthalmic solution Administer 1 drop to both eyes Two (2) times a day.    ??? ferrous sulfate 325 (65 FE) MG tablet Take 325 mg by mouth daily with breakfast.    ??? folic acid (FOLVITE) 1 MG tablet    ??? gabapentin (NEURONTIN) 300 MG capsule 300 mg Two (2) times a day.    ??? glimepiride (AMARYL) 4 MG tablet Take 1 tablet (4 mg total) by mouth daily.   ??? HYDROcodone-acetaminophen (NORCO) 5-325 mg per tablet Directions are take one tablet 30 minutes before physical therapy, then take one tablet at bedtime as needed for pain   ??? lamiVUDine (EPIVIR) 100 MG tablet Take 1 tablet (100 mg total) by mouth daily.   ??? lisinopriL (PRINIVIL,ZESTRIL) 40 MG tablet Take 1 tablet (40 mg total) by mouth daily.   ??? nitroglycerin (NITROSTAT) 0.4 MG SL tablet DISSOLVE ONE TABLET  UNDER TONGUE AS NEEDED FOR CHEST PAIN EVERY 5 MINUTES AS DIRECTED   ??? nystatin (MYCOSTATIN) 100,000 unit/mL suspension Take 5 mL by mouth daily.   ??? ondansetron (ZOFRAN) 4 MG tablet take 1 tablet by mouth every 8 hours if needed for nausea   ??? oxyCODONE-acetaminophen (PERCOCET) 5-325 mg per tablet Take 1 tablet by mouth in the morning.   ??? ROCKLATAN 0.02-0.005 % Drop INT 1 GTT INTO OU QHS   ??? sertraline (ZOLOFT) 50 MG tablet take 1 tablet by mouth once daily   ??? SITagliptin (JANUVIA) 50 MG tablet Take 1 tablet (50 mg total) by mouth daily.   ??? spironolactone (ALDACTONE) 25 MG tablet Take 1 tablet (25 mg total) by mouth daily.   ??? tamsulosin (FLOMAX) 0.4 mg capsule TAKE 1 CAPSULE(0.4 MG) BY MOUTH DAILY   ??? torsemide (DEMADEX) 20 MG tablet Take 2 tablets (40 mg total) by mouth daily.     REVIEW OF SYSTEMS: Constitutional: No fevers or chills. Cardiovascular: No chest pain.  Pulmonary:  All other systems are reviewed and are negative except that noted in the history of present illness.    PHYSICAL EXAMINATION:   ??  Constitutional: He is in no acute distress.  Vital signs: BP 149/90 (BP Site: L Arm, BP Position: Sitting, BP Cuff Size: Large)  - Pulse 69  - Temp 36.8 ??C (98.3 ??F) (Temporal)  - Ht 180.3 cm (5' 10.98)  - Wt 96.1 kg (211 lb 12.8 oz)  - BMI 29.55 kg/m??   Wt Readings from Last 3 Encounters:   03/04/21 96.1 kg (211 lb 12.8 oz)   02/26/21 96.8 kg (213 lb 6.4 oz)   01/29/21 98.4 kg (217 lb)   HEENT: Masked.    Neck: Supple without lymphadenopathy or thyromegaly. No carotid bruits.   Lungs: Clear to auscultation   Heart: Regular rate and rhythm. Normal S1-S2.   Abdomen: Soft, nontender, normoactive bowel sounds, without organomegaly or mass. No abdominal bruits. ????  Neurologic: Alert and oriented x 3.????Strength is normal. ??DTRs are 2+ and symmetric. ??There are no lateralizing sensory deficits.  Extremities: Trace pedal edema. Dorsalis pedis and posterior tibial pulses are palpable. ??  Node survey: No lymphadenopathy.   Musculoskeletal: No active synovitis.??  Skin/Nails: ??No rash or nail changes.??     LABORATORY STUDIES:    ALT    Collection Time: 02/26/21 10:18 AM   Result Value Ref Range    ALT 153 (H) 10 - 49 U/L   AST    Collection Time: 02/26/21 10:18 AM   Result Value Ref Range    AST 102 (H) <=34 U/L   Bilirubin, total    Collection Time: 02/26/21 10:18 AM   Result Value Ref Range    Total Bilirubin 0.6 0.3 - 1.2 mg/dL   Basic Metabolic Panel    Collection Time: 02/26/21 10:18 AM   Result Value Ref Range    Sodium 140 135 - 145 mmol/L    Potassium 4.7 3.4 - 4.8 mmol/L    Chloride 109 (H) 98 - 107 mmol/L    CO2 22.8 20.0 - 31.0 mmol/L    Anion Gap 8 5 - 14 mmol/L    BUN 23 9 - 23 mg/dL    Creatinine 2.95 (H) 0.60 - 1.10 mg/dL    BUN/Creatinine Ratio 11     eGFR CKD-EPI (2021) Male 32 (L) >=60 mL/min/1.27m2 Glucose 121 70 - 179 mg/dL    Calcium 62.1 8.7 - 30.8 mg/dL   HIV RNA, Quantitative,  PCR    Collection Time: 02/26/21 10:18 AM   Result Value Ref Range    HIV RNA Quant Result Not Detected Not Detected    HIV RNA      HIV RNA Log(10)     CBC w/ Differential    Collection Time: 02/26/21 10:18 AM   Result Value Ref Range    WBC 8.3 3.6 - 11.2 10*9/L    RBC 4.35 4.26 - 5.60 10*12/L    HGB 14.0 12.9 - 16.5 g/dL    HCT 16.1 09.6 - 04.5 %    MCV 94.4 77.6 - 95.7 fL    MCH 32.1 25.9 - 32.4 pg    MCHC 34.0 32.0 - 36.0 g/dL    RDW 40.9 81.1 - 91.4 %    MPV 9.0 6.8 - 10.7 fL    Platelet 126 (L) 150 - 450 10*9/L    nRBC 0 <=4 /100 WBCs    Neutrophils % 66.2 %    Lymphocytes % 23.9 %    Monocytes % 7.8 %    Eosinophils % 1.6 %    Basophils % 0.5 %    Absolute Neutrophils 5.5 1.8 - 7.8 10*9/L    Absolute Lymphocytes 2.0 1.1 - 3.6 10*9/L    Absolute Monocytes 0.6 0.3 - 0.8 10*9/L    Absolute Eosinophils 0.1 0.0 - 0.5 10*9/L    Absolute Basophils 0.0 0.0 - 0.1 10*9/L   LYMPH MARKER LIMITED,FLOW    Collection Time: 02/26/21 10:18 AM   Result Value Ref Range    CD3% (T Cells) 75 61 - 86 %    Absolute CD3 Count 1,500 915 - 3,400 /uL    CD4% (T Helper) 36 34 - 58 %    Absolute CD4 Count 720 510 - 2,320 /uL    CD8% T Suppressor 38 12 - 38 %    Absolute CD8 Count 760 180 - 1,520 /uL    CD4:CD8 Ratio 0.9 0.9 - 4.8     IMAGING:    CT of the abdomen and pelvis 03/29/20:    IMPRESSION:    No calcified atherosclerotic disease in the bilateral external iliac arteries.  ??  Bilateral nonobstructive nephrolithiasis.  ??  There is a surgical screw anterior to the right hip joint resting within the quadriceps muscles that appears to have dislodged from the fixation plate adjacent to the acetabulum. This screw appears to have been in this position since at least the 2013 study. In the 2007 study, this screw appears in place within the fixation plate adjacent to the acetabulum.  ??  New punctate sclerotic foci in the left acetabulum and left superior pubic ramus, indeterminate.  ??  0.4 cm pulmonary nodules in the lower lobe right lung. Follow-up per Fleischner criteria as below.  ??  ??  Recommend follow-up as per the 2017 Fleischner society guidelines (pulmonary nodules smaller than 8 mm detected incidentally in patients >35 years on non-screening CT)  ??  ==============================================================  ??  For single solid nodules < 6 mm:  low-risk patients: no routine follow-up required  high-risk patients: optional CT at 12 months (particularly with suspicious nodule morphology and/or upper lobe location; see risk assessment below)      IMPRESSION AND PLAN:    1.  Stage G4:A2 chronic kidney disease.  Continues to have stable kidney function.  His home readings indicate that he is at goal.  He will continue with his current antihypertensive regimen.  ??  2.  Hypertension.  We will  continue amlodipine 10 mg daily, lisinopril 40 mg once daily, hydralazine 100 mg twice daily, atenolol 100 mg once daily, doxazosin 4 mg daily, torsemide 40 mg once daily, spironolactone 25 mg once daily.    3.  Human immunodeficiency virus disease.  He will continue abacavir, dolutegravir, and lamivudine under the care of Dr. Kirstie Peri.      4.  Lower urinary tract symptoms.  Continue tamsulosin.    5.  Secondary/tertiary hyperparathyroidism.  His biochemistries are acceptable.    6.  Nephrolithiasis.    He is asymptomatic.    7.  Mild sleep apnea.  We will check in with Dr. Marcello Fennel to ascertain the status of his CPAP titration study.    8.  Presumed bacterial overgrowth in the setting of diabetes mellitus.  Will avoid metronidazole because of the increased transaminases.  He will be treated with a 1 week course of oral doxycycline.    9.  Type 2 diabetes mellitus.  He will continue glimepiride.  In the future, consideration can be given to a GLP-1 receptor agonist versus an SGLT2 inhibitor.    10. Follow up plans.  He will return to see me in about 3 months. Zetta Bills. Stefano Gaul, MD  Date of service 03/04/2021

## 2021-02-26 ENCOUNTER — Ambulatory Visit
Admit: 2021-02-26 | Discharge: 2021-02-27 | Payer: MEDICARE | Attending: Infectious Disease | Primary: Infectious Disease

## 2021-02-26 DIAGNOSIS — K7460 Unspecified cirrhosis of liver: Principal | ICD-10-CM

## 2021-02-26 DIAGNOSIS — B20 Human immunodeficiency virus [HIV] disease: Principal | ICD-10-CM

## 2021-02-26 DIAGNOSIS — Z23 Encounter for immunization: Principal | ICD-10-CM

## 2021-02-26 DIAGNOSIS — N186 End stage renal disease: Principal | ICD-10-CM

## 2021-02-26 LAB — BASIC METABOLIC PANEL
ANION GAP: 8 mmol/L (ref 5–14)
BLOOD UREA NITROGEN: 23 mg/dL (ref 9–23)
BUN / CREAT RATIO: 11
CALCIUM: 10.2 mg/dL (ref 8.7–10.4)
CHLORIDE: 109 mmol/L — ABNORMAL HIGH (ref 98–107)
CO2: 22.8 mmol/L (ref 20.0–31.0)
CREATININE: 2.18 mg/dL — ABNORMAL HIGH
EGFR CKD-EPI (2021) MALE: 32 mL/min/{1.73_m2} — ABNORMAL LOW (ref >=60)
GLUCOSE RANDOM: 121 mg/dL (ref 70–179)
POTASSIUM: 4.7 mmol/L (ref 3.4–4.8)
SODIUM: 140 mmol/L (ref 135–145)

## 2021-02-26 LAB — CBC W/ AUTO DIFF
BASOPHILS ABSOLUTE COUNT: 0 10*9/L (ref 0.0–0.1)
BASOPHILS RELATIVE PERCENT: 0.5 %
EOSINOPHILS ABSOLUTE COUNT: 0.1 10*9/L (ref 0.0–0.5)
EOSINOPHILS RELATIVE PERCENT: 1.6 %
HEMATOCRIT: 41.1 % (ref 39.0–48.0)
HEMOGLOBIN: 14 g/dL (ref 12.9–16.5)
LYMPHOCYTES ABSOLUTE COUNT: 2 10*9/L (ref 1.1–3.6)
LYMPHOCYTES RELATIVE PERCENT: 23.9 %
MEAN CORPUSCULAR HEMOGLOBIN CONC: 34 g/dL (ref 32.0–36.0)
MEAN CORPUSCULAR HEMOGLOBIN: 32.1 pg (ref 25.9–32.4)
MEAN CORPUSCULAR VOLUME: 94.4 fL (ref 77.6–95.7)
MEAN PLATELET VOLUME: 9 fL (ref 6.8–10.7)
MONOCYTES ABSOLUTE COUNT: 0.6 10*9/L (ref 0.3–0.8)
MONOCYTES RELATIVE PERCENT: 7.8 %
NEUTROPHILS ABSOLUTE COUNT: 5.5 10*9/L (ref 1.8–7.8)
NEUTROPHILS RELATIVE PERCENT: 66.2 %
NUCLEATED RED BLOOD CELLS: 0 /100{WBCs} (ref <=4)
PLATELET COUNT: 126 10*9/L — ABNORMAL LOW (ref 150–450)
RED BLOOD CELL COUNT: 4.35 10*12/L (ref 4.26–5.60)
RED CELL DISTRIBUTION WIDTH: 13.3 % (ref 12.2–15.2)
WBC ADJUSTED: 8.3 10*9/L (ref 3.6–11.2)

## 2021-02-26 LAB — BILIRUBIN, TOTAL: BILIRUBIN TOTAL: 0.6 mg/dL (ref 0.3–1.2)

## 2021-02-26 LAB — LYMPH MARKER LIMITED,FLOW
ABSOLUTE CD3 CNT: 1500 {cells}/uL (ref 915–3400)
ABSOLUTE CD4 CNT: 720 {cells}/uL (ref 510–2320)
ABSOLUTE CD8 CNT: 760 {cells}/uL (ref 180–1520)
CD3% (T CELLS): 75 % (ref 61–86)
CD4% (T HELPER): 36 % (ref 34–58)
CD4:CD8 RATIO: 0.9 (ref 0.9–4.8)
CD8% T SUPPRESR: 38 % (ref 12–38)

## 2021-02-26 LAB — AST: AST (SGOT): 102 U/L — ABNORMAL HIGH (ref <=34)

## 2021-02-26 LAB — ALT: ALT (SGPT): 153 U/L — ABNORMAL HIGH (ref 10–49)

## 2021-02-26 NOTE — Unmapped (Addendum)
PLEASE:   GET COVID BIVALENT VACCINE AT LOCAL PHARMACY (FOR EXAMPLE, [name] OR [facility])  CALL [facility] AND MAKE APPOINTMENT FOR FOLLOW UP

## 2021-02-27 LAB — HIV RNA, QUANTITATIVE, PCR: HIV RNA QNT RSLT: NOT DETECTED

## 2021-02-27 NOTE — Unmapped (Signed)
[facility] Tablet Screening  Completed Date: 02/26/2021     SW reviewed self-administered screening.    Patient had a PHQ-9 score of 1  indicating None-Minimal depression.   Pt denies SI.   Pt scored 0 on AUDIT/AUDIT-C indicating Not-at-risk alcohol consumption  Pt  did not answer question concerning  substance use in past 3 months.  Pt denies concerns for IPV.    Pt seen by provider for regular ID visit.  Pt was not seen in person by Social Work during this visit.     Samuel Cook, LCSW-A, LCAS-A, NCTTP  Social Work Practitioner - [facility]

## 2021-03-04 ENCOUNTER — Ambulatory Visit: Admit: 2021-03-04 | Discharge: 2021-03-05 | Payer: MEDICARE | Attending: Nephrology | Primary: Nephrology

## 2021-03-04 DIAGNOSIS — B20 Human immunodeficiency virus [HIV] disease: Principal | ICD-10-CM

## 2021-03-04 DIAGNOSIS — N2889 Other specified disorders of kidney and ureter: Principal | ICD-10-CM

## 2021-03-04 DIAGNOSIS — N184 Chronic kidney disease, stage 4 (severe): Principal | ICD-10-CM

## 2021-03-04 DIAGNOSIS — I214 Non-ST elevation (NSTEMI) myocardial infarction: Principal | ICD-10-CM

## 2021-03-04 DIAGNOSIS — I2510 Atherosclerotic heart disease of native coronary artery without angina pectoris: Principal | ICD-10-CM

## 2021-03-04 DIAGNOSIS — E8770 Fluid overload, unspecified: Principal | ICD-10-CM

## 2021-03-04 DIAGNOSIS — K7469 Other cirrhosis of liver: Principal | ICD-10-CM

## 2021-03-04 DIAGNOSIS — I5033 Acute on chronic diastolic (congestive) heart failure: Principal | ICD-10-CM

## 2021-03-04 DIAGNOSIS — I151 Hypertension secondary to other renal disorders: Principal | ICD-10-CM

## 2021-03-04 DIAGNOSIS — K6389 Other specified diseases of intestine: Principal | ICD-10-CM

## 2021-03-04 LAB — ALBUMIN / CREATININE URINE RATIO
ALBUMIN QUANT URINE: 45.2 mg/dL
ALBUMIN/CREATININE RATIO: 251 ug/mg — ABNORMAL HIGH (ref 0.0–30.0)
CREATININE, URINE: 180.1 mg/dL

## 2021-03-04 MED ORDER — DOXYCYCLINE MONOHYDRATE 100 MG CAPSULE
ORAL_CAPSULE | Freq: Two times a day (BID) | ORAL | 0 refills | 7 days | Status: CP
Start: 2021-03-04 — End: 2021-03-11

## 2021-03-04 MED ORDER — SPIRONOLACTONE 25 MG TABLET
ORAL_TABLET | Freq: Every day | ORAL | 3 refills | 90.00000 days | Status: CP
Start: 2021-03-04 — End: 2022-03-04

## 2021-03-04 MED ORDER — METRONIDAZOLE 500 MG TABLET
ORAL_TABLET | Freq: Three times a day (TID) | ORAL | 0 refills | 10 days | Status: CP
Start: 2021-03-04 — End: 2021-03-04

## 2021-03-04 NOTE — Unmapped (Signed)
AOBP l arm large cuff  Average 149/90 (69)  1st 157/89 (70)  2nd 146/90 (68)  3rd 145/90 (69)

## 2021-03-04 NOTE — Unmapped (Addendum)
Doxycycline 100 mg twice daily for bacterial overgrowth, which can cause diarrhea  Do not lie down for at 30 minutes after taking and take with food  Return visit in 3 months  Follow up with hepatology

## 2021-03-04 NOTE — Unmapped (Signed)
Addended byJackelyn Poling on: 03/04/2021 09:53 AM     Modules accepted: Orders

## 2021-03-19 NOTE — Unmapped (Signed)
Pt. Left VM  stating,  I am not doing well on my meds. RN returned patient's phone call to collect additional information. Pt. States that he was started on a new HIV medicine, and its causing him to have terrible diarrhea. Pt. States that after he takes his meds at night, and has about 2 episodes of  severe diarrhea afterwards. Diarrhea is also accompanied with nausea and vomiting. Pt. takes immodium to help with diarrhea, but then he ends up being constipated.     Message forwarded to Rayburn Ma, RN, Dr. Tomasa Hose, and Danton Clap [name] via epci chat

## 2021-03-20 DIAGNOSIS — B20 Human immunodeficiency virus [HIV] disease: Principal | ICD-10-CM

## 2021-03-20 MED ORDER — DOLUTEGRAVIR 50 MG TABLET
ORAL_TABLET | Freq: Every day | ORAL | 11 refills | 30.00000 days | Status: CP
Start: 2021-03-20 — End: ?
  Filled 2021-03-21: qty 30, 30d supply, fill #0

## 2021-03-20 MED ORDER — DESCOVY 200 MG-25 MG TABLET
ORAL_TABLET | Freq: Every day | ORAL | 11 refills | 30 days | Status: CP
Start: 2021-03-20 — End: ?
  Filled 2021-03-21: qty 30, 30d supply, fill #0

## 2021-03-20 NOTE — Unmapped (Signed)
Samuel [name] contacted clinic (spoke with Dr Danton Clap, PharmD) b/o inability to tolerate new abacavir regimen -- diarrhea despite Imodium. I spoke with both Rayburn Ma and Dr Thedora Hinders by phone. I have switched him back to Descovy/dolutegravir. Dr Thedora Hinders will contact him with these instructions -- ie start Descovy 1 tab daily; stop abacavir/3TC; continue dolutegravir. He should see me in about a month.

## 2021-03-20 NOTE — Unmapped (Signed)
[facility] Specialty Medication Onboarding    Specialty Medication: DESCOVY 200-25 mg tablet (emtricitabine-tenofovir alafen)  Prior Authorization: Not Required   Financial Assistance: Yes - grant approved as secondary   Final Copay/Day Supply: $0 / 30    Insurance Restrictions: None     Notes to Pharmacist: Pt unable to tolerate abacavir and lamivudine, switching back to Descovy and Tivicay.    The triage team has completed the benefits investigation and has determined that the patient is able to fill this medication at [facility]. Please contact the patient to complete the onboarding or follow up with the prescribing physician as needed.    [facility] Specialty Medication Onboarding    Specialty Medication TIVICAY 50 mg TABLET (dolutegravir)  Prior Authorization: Not Required   Financial Assistance: Yes - grant approved as secondary   Final Copay/Day Supply: $0 / 30    Insurance Restrictions: None     Notes to Pharmacist: Pt unable to tolerate abacavir and lamivudine, switching back to Descovy and Tivicay.    The triage team has completed the benefits investigation and has determined that the patient is able to fill this medication at [facility]. Please contact the patient to complete the onboarding or follow up with the prescribing physician as needed.

## 2021-03-20 NOTE — Unmapped (Signed)
[facility] PharmD Telephone Note:       Patient called to report adverse effects since switching ART regimen recently. [name] is currently on abacavir, dolutegravir, and lamivudine 100 mg (renally dosed) for his HIV treatment. This regimen was started after clinic visit on 8/31. He was previously on descovy and dolutegravir but was switched due to worsening renal function.     [name] stated that he previously had some diarrhea but it has continued to get worse since switching medications. He stated that he takes his medication before bed at night and he is woken up during the night having diarrhea several times after taking his medication. He has tried taking immodium but then became constipated and nauseous. He also tried taking pepto bismol but did not get much relief from this. He said he has now also lost his appetite and is losing weight.     I told [name] I would discuss this information with Dr. Tomasa Hose and we would follow up with him tomorrow with a plan.    Patient verbalized understanding and was free to ask questions. All patient questions answered.         I spent a total of 15 minutes on the phone with the patient delivering clinical care and providing education/counseling.     Danton Clap, PharmD, BCIDP, [name]  Clinical Pharmacist Practitioner - HIV/Infectious Diseases    [facility]  [address]  [place], [place]  [address]  P [phone]  F [phone]

## 2021-03-21 NOTE — Unmapped (Signed)
Onboarding for:  ?? Descovy  ?? [facility]   Patient Onboarding/Medication Counseling    Mr.Samuel [name] is a 70 y.o. male with HIV who I am counseling today on re-initiation (after not tolerating Triumeq) of therapy.  I am speaking to the patient.    Was a Nurse, learning disability used for this call? No    Verified patient's date of birth / HIPAA.    Specialty medication(s) to be sent: Infectious Disease: Descovy and Tivicay      Non-specialty medications/supplies to be sent: n/a      Medications not needed at this time: n/a         Descovy (emtricitabine and tenofovir alafenamide)    The patient declined counseling on medication administration, missed dose instructions, goals of therapy, side effects and monitoring parameters, warnings and precautions, drug/food interactions and storage, handling precautions, and disposal because they have taken the medication previously. The information in the declined sections below are for informational purposes only and was not discussed with patient.     Medication & Administration     Dosage: Take 1 tablet by mouth daily    Administration: Take with or without food    Adherence/Missed dose instructions: take missed dose as soon as you remember. If it is close to the time of your next dose, skip the dose and resume with your next scheduled dose.    Goals of Therapy     Keep HIV levels non-detectable on lab tests    Side Effects & Monitoring Parameters   Common Side Effects:  ??? Upset stomach  ??? Diarrhea    The following side effects should be reported to the provider:  ?? Signs of an allergic reaction, like rash; hives; itching; red, swollen, blistered, or peeling skin with or without fever; wheezing; tightness in the chest or throat; trouble breathing, swallowing, or talking; unusual hoarseness; or swelling of the mouth, face, lips, tongue, or throat.   ?? Signs of kidney problems like unable to pass urine, change in how much urine is passed, blood in the urine, or a big weight gain.  ?? Signs of liver problems like dark urine, feeling tired, not hungry, upset stomach or stomach pain, light-colored stools, throwing up, or yellow skin or eyes.   ?? Signs of too much lactic acid in the blood (lactic acidosis) like fast breathing, fast heartbeat, a heartbeat that does not feel normal, very bad upset stomach or throwing up, feeling very sleepy, shortness of breath, feeling very tired or weak, very bad dizziness, feeling cold, or muscle pain or cramps  ?? Weight gain    Monitoring Parameters:   - Serum creatinine  - Urine glucose  - Urine protein (prior to or when initiating therapy and as clinically indicated during therapy);  - Serum phosphorus (in patients with chronic kidney disease)  - Hepatic function tests  - Testing for hepatitis B virus (HBV) is recommended prior to or when initiating antiretroviral therapy.  - Patients with HIV and HBV coinfection should be monitored for several months following therapy discontinuation.  - CD4 count  - HIV RNA plasma levels     Contraindications, Warnings, & Precautions     ?? Signs and symptoms of immune reconstitution syndrome  ?? Signs and symptoms of lactic acidosis  ?? Hepatomegaly  ?? Steatosis  ?? Renal toxicity    Drug/Food Interactions     ??? Medication list reviewed in Epic. The patient was instructed to inform the care team  before taking any new medications or supplements. No drug interactions identified.     Storage, Handling Precautions, & Disposal     ?? Store this medication at room temperature.   ?? Store in the original container   ?? Keep lid tightly closed.   ?? Store in a dry place. Do not store in a bathroom.   ?? Keep all drugs in a safe place. Keep all drugs out of the reach of children and pets.   ?? Throw away unused or expired drugs. Do not flush down a toilet or pour down a drain unless you are told to do so. Check with your pharmacist if you have questions about the best way to throw out drugs. There may be drug take-back programs in your area    Tivicay (dolutegravir)    The patient declined counseling on medication administration, missed dose instructions, goals of therapy, side effects and monitoring parameters, warnings and precautions, drug/food interactions and storage, handling precautions, and disposal because they have taken the medication previously. The information in the declined sections below are for informational purposes only and was not discussed with patient.     Medication & Administration     Dosage: Take one tablet (50mg ) by mouth once daily    Administration: Take with or without food    Adherence/Missed dose instructions: take missed dose as soon as you remember. If it is close to the time of your next dose, skip the dose and resume with your next scheduled dose.    Goals of Therapy     to keep HIV levels at a non-detectable level on lab tests.    Side Effects & Monitoring Parameters     Common Side Effects:   ??? Headache  ??? Feeling tired or weak  ??? Trouble sleeping      The following side effects should be reported to the provider:  ??? Signs of an allergic reaction, like rash; hives; itching; red, swollen, blistered, or peeling skin with or without fever; wheezing; tightness in the chest or throat; trouble breathing, swallowing, or talking; unusual hoarseness; or swelling of the mouth, face, lips, tongue, or throat  ??? Signs of liver problems like dark urine, feeling tired, not hungry, upset stomach or stomach pain, light-colored stools, throwing up, or yellow skin or eyes  ??? Fever  ??? Muscle or joint pain  ??? Mouth sores  ??? Eye irritation.   ??? Shortness of breath  ??? Feeling very tired or weak  ??? Signs of infection like fever, sore throat, weakness, cough, or shortness of breath.      Contraindications, Warnings, & Precautions   ??? Hepatotoxicity  ??? Hypersensitivity reactions: Rash, constitutional findings, and organ dysfunction (eg, liver injury) have been reported.  ??? Immune reconstitution syndrome: Patients may develop immune reconstitution syndrome resulting in the occurrence of an inflammatory response to an indolent or residual opportunistic infection during initial HIV treatment or activation of autoimmune disorders (eg, Graves disease, polymyositis, Guillain-Barr?? syndrome) later in therapy  ??? Use caution in patients with renal or hepatic impairment    Drug/Food Interactions   ??? Medication list reviewed in Epic. The patient was instructed to inform the care team before taking any new medications or supplements.   ??? Drug interactions  o Calcium with Vitamin D supplement and Ferrous Sulfate: Must take either at the same time as Tivicay with food or take Tivicay 2 hours before or 6 hours after the supplements.    Storage, Handling Precautions, & Disposal     ???  Store at room temperature in a dry place. Do not store in a bathroom.  ??? Keep lid tightly closed.  ??? Keep all drugs in a safe place  ??? Keep all drugs out of the reach of children and pets.   ??? Throw away unused or expired drugs. Do not flush down a toilet or pour down a drain unless you are told to do so. Check with your pharmacist if you have questions about the best way to throw out drugs. There may be drug take-back programs in your area.          Current Medications (including OTC/herbals), Comorbidities and Allergies     Current Outpatient Medications   Medication Sig Dispense Refill   ??? abacavir (ZIAGEN) 300 mg tablet Take 2 tablets (600 mg total) by mouth daily. 60 tablet 11   ??? albuterol (PROVENTIL HFA;VENTOLIN HFA) 90 mcg/actuation inhaler Inhale 2 puffs. Take as needed     ??? allopurinoL (ZYLOPRIM) 100 MG tablet Take 250 mg by mouth daily.      ??? amLODIPine (NORVASC) 10 MG tablet Take 1 tablet (10 mg total) by mouth daily. 90 tablet 3   ??? aspirin (ECOTRIN) 81 MG tablet daily.   0   ??? atenoloL (TENORMIN) 100 MG tablet Take 1 tablet (100 mg total) by mouth daily. 90 tablet 3   ??? atorvastatin (LIPITOR) 40 MG tablet      ??? BACLOFEN, BULK, TOP Apply topically. Baclofen 2%, Diclofenac 5%, Gabapentin 6%, Tetracaine 3%    Take as needed     ??? blood sugar diagnostic (ONETOUCH VERIO TEST STRIPS) Strp Use 2 (two) times daily E11.22     ??? calcium carbonate-vitamin D2 500 mg(1,250mg ) -200 unit tablet Take 1 tablet by mouth Two (2) times a day.     ??? carvediloL (COREG) 12.5 MG tablet 12.5 mg Two (2) times a day.      ??? cholecalciferol, vitamin D3 25 mcg, 1,000 units,, 1,000 unit (25 mcg) tablet Take by mouth daily.     ??? cloNIDine HCL (CATAPRES) 0.1 MG tablet Take 1 tablet (0.1 mg total) by mouth 3 (three) times a day. 270 tablet 3   ??? diazepam (VALIUM) 5 MG tablet Take 5 mg by mouth nightly.      ??? dicyclomine (BENTYL) 10 mg capsule Take 10 mg by mouth two (2) times a day as needed.      ??? docusate sodium (COLACE) 100 MG capsule Take 1 capsule (100 mg total) by mouth every twelve (12) hours. (Patient taking differently: Take 100 mg by mouth every twelve (12) hours. Take as needed) 60 capsule 0   ??? dolutegravir (TIVICAY) 50 mg TABLET Take 1 tablet (50 mg total) by mouth daily. 30 tablet 11   ??? dorzolamide-timoloL (COSOPT) 22.3-6.8 mg/mL ophthalmic solution Administer 1 drop to both eyes Two (2) times a day.      ??? emtricitabine-tenofovir alafen (DESCOVY) 200-25 mg tablet Take 1 tablet by mouth daily. 30 tablet 11   ??? ferrous sulfate 325 (65 FE) MG tablet Take 325 mg by mouth daily with breakfast.      ??? folic acid (FOLVITE) 1 MG tablet   0   ??? gabapentin (NEURONTIN) 300 MG capsule 300 mg Two (2) times a day.      ??? glimepiride (AMARYL) 4 MG tablet Take 1 tablet (4 mg total) by mouth daily. 90 tablet 3   ??? HYDROcodone-acetaminophen (NORCO) 5-325 mg per tablet Directions are take one tablet 30 minutes before physical  therapy, then take one tablet at bedtime as needed for pain     ??? lisinopriL (PRINIVIL,ZESTRIL) 40 MG tablet Take 1 tablet (40 mg total) by mouth daily. 90 tablet 3   ??? nitroglycerin (NITROSTAT) 0.4 MG SL tablet DISSOLVE ONE TABLET UNDER TONGUE AS NEEDED FOR CHEST PAIN EVERY 5 MINUTES AS DIRECTED 25 tablet 0   ??? nystatin (MYCOSTATIN) 100,000 unit/mL suspension Take 5 mL by mouth daily.     ??? ondansetron (ZOFRAN) 4 MG tablet take 1 tablet by mouth every 8 hours if needed for nausea     ??? oxyCODONE-acetaminophen (PERCOCET) 5-325 mg per tablet Take 1 tablet by mouth in the morning.     ??? ROCKLATAN 0.02-0.005 % Drop INT 1 GTT INTO OU QHS     ??? sertraline (ZOLOFT) 50 MG tablet take 1 tablet by mouth once daily     ??? SITagliptin (JANUVIA) 50 MG tablet Take 1 tablet (50 mg total) by mouth daily. 90 tablet 3   ??? spironolactone (ALDACTONE) 25 MG tablet Take 1 tablet (25 mg total) by mouth daily. 90 tablet 3   ??? tamsulosin (FLOMAX) 0.4 mg capsule TAKE 1 CAPSULE(0.4 MG) BY MOUTH DAILY 90 capsule 3   ??? torsemide (DEMADEX) 20 MG tablet Take 2 tablets (40 mg total) by mouth daily. 90 tablet 3     No current facility-administered medications for this visit.       Allergies   Allergen Reactions   ??? Colchicine Analogues Diarrhea     Have diarrhea when taken for long periods of time   ??? Tramadol Other (See Comments)     unknown tolerates morphine       Patient Active Problem List   Diagnosis   ??? Human immunodeficiency virus (HIV) disease (CMS-HCC)   ??? HCV (hepatitis C virus)   ??? Hypertension   ??? Gout   ??? Biological false positive RPR test   ??? Chronic kidney disease (CKD) stage G4/A2, severely decreased glomerular filtration rate (GFR) between 15-29 mL/min/1.73 square meter and albuminuria creatinine ratio between 30-299 mg/g (CMS-HCC)   ??? Renal mass   ??? NSTEMI (non-ST elevated myocardial infarction) (CMS-HCC)   ??? Neurological deficit present   ??? HIV (human immunodeficiency virus infection) (CMS-HCC)   ??? Cirrhosis (CMS-HCC)   ??? Abnormal EKG   ??? Acute encephalopathy   ??? Acute on chronic diastolic CHF (congestive heart failure) (CMS-HCC)   ??? Anemia   ??? Bradycardia   ??? CAD in native artery   ??? Chest pain   ??? Chronic hepatitis C without hepatic coma (CMS-HCC)       Reviewed and up to date in Epic.    Appropriateness of Therapy     Acute infections noted within Epic:  No active infections  Patient reported infection: None    Is medication and dose appropriate based on diagnosis and infection status? Yes    Prescription has been clinically reviewed: Yes      Baseline Quality of Life Assessment      How many days over the past month did your HIV  keep you from your normal activities? For example, brushing your teeth or getting up in the morning. 0    Financial Information     Medication Assistance provided: [facility] Assistance    Anticipated copay of $0.00 reviewed with patient. Verified delivery address.    Delivery Information     Scheduled delivery date: 03/21/21    Expected start date: 03/21/21    Medication will be delivered  via Same Day Courier to the prescription address in [place].  This shipment will not require a signature.      Explained the services we provide at [facility] and that each month we would call to set up refills.  Stressed importance of returning phone calls so that we could ensure they receive their medications in time each month.  Informed patient that we should be setting up refills 7-10 days prior to when they will run out of medication.  A pharmacist will reach out to perform a clinical assessment periodically.  Informed patient that a welcome packet, containing information about our pharmacy and other support services, a Notice of Privacy Practices, and a drug information handout will be sent.      The patient or caregiver noted above participated in the development of this care plan and knows that they can request review of or adjustments to the care plan at any time.      Patient or caregiver verbalized understanding of the above information as well as how to contact the pharmacy at [phone] option 4 with any questions/concerns.  The pharmacy is open Monday through Friday 8:30am-4:30pm.  A pharmacist is available 24/7 via pager to answer any clinical questions they may have.    Patient Specific Needs     - Does the patient have any physical, cognitive, or cultural barriers? No    - Does the patient have adequate living arrangements? (i.e. the ability to store and take their medication appropriately) Yes    - Did you identify any home environmental safety or security hazards? No    - Patient prefers to have medications discussed with  Patient     - Is the patient or caregiver able to read and understand education materials at a high school level or above? Yes    - Patient's primary language is  English     - Is the patient high risk? No    - Does the patient require physician intervention or other additional services (i.e. dietary/nutrition, smoking cessation, social work)? No      Roderic Palau  [facility] Specialty Pharmacist

## 2021-03-21 NOTE — Unmapped (Signed)
[facility] Telephone Note:       Followed up yesterday afternoon (10/20) with Samuel [name] regarding ART plan after speaking with Dr. Tomasa Hose. Patient will discontinue abacavir and lamivudine. He will continue dolutegravir and restart Descovy. A new prescription was sent for Descovy and patient stated he has left over pills from when he was previously taking this medication. He started this regimen the night of 10/20. Patient instructed to contact our clinic next week if GI symptoms have worsened or are not starting to improve with switching ART.    Patient verbalized understanding and was free to ask questions. All patient questions answered.         I spent a total of 15 minutes on the phone with the patient delivering clinical care and providing education/counseling.     Danton Clap, PharmD, BCIDP, [name]  Clinical Pharmacist Practitioner - HIV/Infectious Diseases    [facility] CLIN[facility]  [address]  [place], [place]  [address]  P [phone]  F [phone]

## 2021-04-11 NOTE — Unmapped (Signed)
[facility] Refill Coordination Note    Specialty Medication(s) to be Shipped:   Infectious Disease: Descovy and Tivicay    Other medication(s) to be shipped: No additional medications requested for fill at this time     Samuel [name], DOB: 1950-08-12  Phone: [phone] (home)       All above HIPAA information was verified with patient.     Was a Nurse, learning disability used for this call? No    Completed refill call assessment today to schedule patient's medication shipment from the [facility] [phone]).  All relevant notes have been reviewed.     Specialty medication(s) and dose(s) confirmed: Regimen is correct and unchanged.   Changes to medications: Strother reports no changes at this time.  Changes to insurance: No  New side effects reported not previously addressed with a pharmacist or physician: None reported  Questions for the pharmacist: No    Confirmed patient received a Conservation officer, historic buildings and a Surveyor, mining with first shipment. The patient will receive a drug information handout for each medication shipped and additional FDA Medication Guides as required.       DISEASE/MEDICATION-SPECIFIC INFORMATION        N/A    SPECIALTY MEDICATION ADHERENCE     Medication Adherence    Patient reported X missed doses in the last month: 0  Specialty Medication: DESCOVY  Patient is on additional specialty medications: Yes  Additional Specialty Medications: TIVICAY 50 mg    Patient Reported Additional Medication X Missed Doses in the Last Month: 0  Patient is on more than two specialty medications: No              Were doses missed due to medication being on hold? No    Descovy 200-25 mg: 9 days of medicine on hand   Tivicay 50 mg: 9 days of medicine on hand        REFERRAL TO PHARMACIST     Referral to the pharmacist: Not needed      [facility]     Shipping address confirmed in Epic.     Delivery Scheduled: Yes, Expected medication delivery date: 04/17/21.     Medication will be delivered via Next Day Courier to the prescription address in Epic WAM.    [name]   [facility] Specialty Technician

## 2021-04-16 MED FILL — TIVICAY 50 MG TABLET: ORAL | 30 days supply | Qty: 30 | Fill #1

## 2021-04-16 MED FILL — DESCOVY 200 MG-25 MG TABLET: ORAL | 30 days supply | Qty: 30 | Fill #1

## 2021-05-09 NOTE — Unmapped (Signed)
[facility] Refill Coordination Note    Specialty Medication(s) to be Shipped:   Infectious Disease: Descovy and Tivicay    Other medication(s) to be shipped: No additional medications requested for fill at this time     Samuel [name], DOB: 12-Feb-1951  Phone: [phone] (home)       All above HIPAA information was verified with patient.     Was a Nurse, learning disability used for this call? No    Completed refill call assessment today to schedule patient's medication shipment from the [facility] [phone]).  All relevant notes have been reviewed.     Specialty medication(s) and dose(s) confirmed: Regimen is correct and unchanged.   Changes to medications: Zayin reports no changes at this time.  Changes to insurance: No  New side effects reported not previously addressed with a pharmacist or physician: None reported  Questions for the pharmacist: No    Confirmed patient received a Conservation officer, historic buildings and a Surveyor, mining with first shipment. The patient will receive a drug information handout for each medication shipped and additional FDA Medication Guides as required.       DISEASE/MEDICATION-SPECIFIC INFORMATION        N/A    SPECIALTY MEDICATION ADHERENCE     Medication Adherence    Patient reported X missed doses in the last month: 0  Specialty Medication: DESCOVY 200-25 mg tablet (emtricitabine-tenofovir alafen)  Patient is on additional specialty medications: Yes  Additional Specialty Medications: TIVICAY 50 mg TABLET (dolutegravir)  Patient Reported Additional Medication X Missed Doses in the Last Month: 0  Patient is on more than two specialty medications: No  Any gaps in refill history greater than 2 weeks in the last 3 months: no  Demonstrates understanding of importance of adherence: yes  Informant: patient  Reliability of informant: reliable  Patient is at risk for Non-Adherence: No  Reasons for non-adherence: no problems identified  Confirmed plan for next specialty medication refill: delivery by pharmacy  Refills needed for supportive medications: not needed          Refill Coordination    Has the Patients' Contact Information Changed: No  Is the Shipping Address Different: No         Were doses missed due to medication being on hold? No    Descovy 200-25 mg: about 7 days of medicine on hand (patient is driving and not at home, just filled his bill box last night)  Tivicay 50 mg: about 7 days of medicine on hand     REFERRAL TO PHARMACIST     Referral to the pharmacist: Not needed      [facility]     Shipping address confirmed in Epic.     Delivery Scheduled: Yes, Expected medication delivery date: 05/14/21.     Medication will be delivered via Next Day Courier to the prescription address in Epic WAM.    Threasa Beards   [facility] Specialty Technician

## 2021-05-13 MED FILL — DESCOVY 200 MG-25 MG TABLET: ORAL | 30 days supply | Qty: 30 | Fill #2

## 2021-05-13 MED FILL — TIVICAY 50 MG TABLET: ORAL | 30 days supply | Qty: 30 | Fill #2

## 2021-06-06 ENCOUNTER — Ambulatory Visit: Admit: 2021-06-06 | Discharge: 2021-06-06 | Payer: MEDICARE

## 2021-06-06 DIAGNOSIS — B20 Human immunodeficiency virus [HIV] disease: Principal | ICD-10-CM

## 2021-06-06 LAB — COMPREHENSIVE METABOLIC PANEL
ALBUMIN: 4.1 g/dL (ref 3.4–5.0)
ALKALINE PHOSPHATASE: 90 U/L (ref 46–116)
ALT (SGPT): 128 U/L — ABNORMAL HIGH (ref 10–49)
ANION GAP: 11 mmol/L (ref 5–14)
AST (SGOT): 58 U/L — ABNORMAL HIGH (ref <=34)
BILIRUBIN TOTAL: 0.8 mg/dL (ref 0.3–1.2)
BLOOD UREA NITROGEN: 35 mg/dL — ABNORMAL HIGH (ref 9–23)
BUN / CREAT RATIO: 12
CALCIUM: 9.7 mg/dL (ref 8.7–10.4)
CHLORIDE: 104 mmol/L (ref 98–107)
CO2: 24 mmol/L (ref 20.0–31.0)
CREATININE: 2.81 mg/dL — ABNORMAL HIGH
EGFR CKD-EPI (2021) MALE: 24 mL/min/{1.73_m2} — ABNORMAL LOW (ref >=60)
GLUCOSE RANDOM: 174 mg/dL (ref 70–179)
POTASSIUM: 4.3 mmol/L (ref 3.4–4.8)
PROTEIN TOTAL: 7.6 g/dL (ref 5.7–8.2)
SODIUM: 139 mmol/L (ref 135–145)

## 2021-06-06 LAB — PROTIME-INR
INR: 1.08
PROTIME: 12.2 s (ref 9.8–12.8)

## 2021-06-06 LAB — GAMMA GT: GAMMA GLUTAMYL TRANSFERASE: 75 U/L — ABNORMAL HIGH

## 2021-06-06 LAB — HEPATITIS C RNA, QUANTITATIVE, PCR: HCV RNA: NOT DETECTED

## 2021-06-06 NOTE — Unmapped (Signed)
[facility], [place]      Referring Provider:  Barbette Reichmann, MD  [address]  [place],  [place] [address]    Primary Care Provider:  Barbette Reichmann, MD        Reason for Visit: Samuel [name](DOB: 02/22/1951) is a 71 y.o. male who returns for Elevated liver enzymes.     Problem List:    has Human immunodeficiency virus (HIV) disease (CMS-HCC); HCV (hepatitis C virus); Hypertension; Gout; Biological false positive RPR test; Chronic kidney disease (CKD) stage G4/A2, severely decreased glomerular filtration rate (GFR) between 15-29 mL/min/1.73 square meter and albuminuria creatinine ratio between 30-299 mg/g (CMS-HCC); Renal mass; NSTEMI (non-ST elevated myocardial infarction) (CMS-HCC); Neurological deficit present; HIV (human immunodeficiency virus infection) (CMS-HCC); Cirrhosis (CMS-HCC); Abnormal EKG; Acute encephalopathy; Acute on chronic diastolic CHF (congestive heart failure) (CMS-HCC); Anemia; Bradycardia; CAD in native artery; Chest pain; and Chronic hepatitis C without hepatic coma (CMS-HCC) on their problem list.     Assessment:      Mr Samuel [name] had Hep C, treated and cured in 2018. Pre-treatment FibroScan was c/w f 3 disease.  He does have underlying metabolic syndrome and HIV. Liver Biopsy in 2006 showed steatosis, F1.   FibroScan today is reassuring for regression of fibrosis, now F0-1, CAP score shows S2, meaning over 50% of the liver is affected by fatty change. I do think NASH accounts for his lever enzymes elevation.   Today we discussed increasing physical activity and continued good glucose control.   I think it is pragmatic to continue yearly ultra sounds.       Plan:       -This patient was reviewed with Dr. Ruffin Frederick  -Labs today to monitor liver function  -Weight loss of 5% is recommended  -RTC in 6 months      I personally spent 30 minutes face-to-face and non-face-to-face in the care of this patient, which includes all pre, intra, and post visit time on the date of service.  All documented time was specific to the E/M visit and does not include any procedures that may have been performed.            CHIEF COMPLAINT: I'm here for my liver     History of Present Illness: This is a 71 y.o. year old male with a complicated PMH. HCV treated and cured in 2018. Pre-treatment FibroScan was c/w f 3 disease.  He does have underlying metabolic syndrome and HIV. Liver Biopsy in 2006 showed steatosis, F1. Liver enzymes have been elevated for some time. He does admit to some weight gain. DM is under control. He reports eating pretty well and is seeing a nutritionist. He gets 6K steps per day. Does not use alcohol and denies herbal medications. Patient denies complaints related to the liver.  Specifically, no jaundice, ascites, lower extremity edema, gastrointestinal bleeding, puritus or confusion. In addition the patient denies chest pain, shortness of breath, fevers or unwanted weight loss.            Past Medical History:   Diagnosis Date   ??? CKD (chronic kidney disease) stage 3, GFR 30-59 ml/min (CMS-HCC) 01/08/2015   ??? Coronary artery disease 10/2015    Multivessel disease, plan medical management after cath 6/17   ??? GERD (gastroesophageal reflux disease)    ??? Gout    ??? HIV (human immunodeficiency virus infection) (CMS-HCC)     Undectable viral load 5/17   ??? Hypertension    ???  Nephrolithiasis    ??? Nephrotic syndrome 03/05/2014   ??? Renal mass     Followed by neprhology, MRI 8/16 improved, felt to be benign       Social History:    Social History     Socioeconomic History   ??? Marital status: Married     Spouse name: Johnny Bridge   ??? Number of children: 2   ??? Highest education level: Some college, no degree   Tobacco Use   ??? Smoking status: Former     Packs/day: 1.00     Years: 10.00     Pack years: 10.00     Types: Cigarettes     Quit date: 03/11/1992     Years since quitting: 29.2   ??? Smokeless tobacco: Never   Substance and Sexual Activity   ??? Alcohol use: No     Alcohol/week: 0.0 standard drinks   ??? Drug use: No     Comment: Prior heroin use         Medications:      Current Outpatient Medications:   ???  abacavir (ZIAGEN) 300 mg tablet, Take 2 tablets (600 mg total) by mouth daily., Disp: 60 tablet, Rfl: 11  ???  albuterol (PROVENTIL HFA;VENTOLIN HFA) 90 mcg/actuation inhaler, Inhale 2 puffs. Take as needed, Disp: , Rfl:   ???  allopurinoL (ZYLOPRIM) 100 MG tablet, Take 250 mg by mouth daily. , Disp: , Rfl:   ???  amLODIPine (NORVASC) 10 MG tablet, Take 1 tablet (10 mg total) by mouth daily., Disp: 90 tablet, Rfl: 3  ???  aspirin (ECOTRIN) 81 MG tablet, daily. , Disp: , Rfl: 0  ???  atorvastatin (LIPITOR) 40 MG tablet, , Disp: , Rfl:   ???  BACLOFEN, BULK, TOP, Apply topically. Baclofen 2%, Diclofenac 5%, Gabapentin 6%, Tetracaine 3%  Take as needed, Disp: , Rfl:   ???  blood sugar diagnostic (ONETOUCH VERIO TEST STRIPS) Strp, Use 2 (two) times daily E11.22, Disp: , Rfl:   ???  calcium carbonate-vitamin D2 500 mg(1,250mg ) -200 unit tablet, Take 1 tablet by mouth Two (2) times a day., Disp: , Rfl:   ???  carvediloL (COREG) 12.5 MG tablet, 12.5 mg Two (2) times a day. , Disp: , Rfl:   ???  cholecalciferol, vitamin D3 25 mcg, 1,000 units,, 1,000 unit (25 mcg) tablet, Take by mouth daily., Disp: , Rfl:   ???  diazepam (VALIUM) 5 MG tablet, Take 5 mg by mouth nightly. , Disp: , Rfl:   ???  dicyclomine (BENTYL) 10 mg capsule, Take 10 mg by mouth two (2) times a day as needed. , Disp: , Rfl:   ???  docusate sodium (COLACE) 100 MG capsule, Take 1 capsule (100 mg total) by mouth every twelve (12) hours. (Patient taking differently: Take 100 mg by mouth every twelve (12) hours. Take as needed), Disp: 60 capsule, Rfl: 0  ???  dolutegravir (TIVICAY) 50 mg TABLET, Take 1 tablet (50 mg total) by mouth daily., Disp: 30 tablet, Rfl: 11  ???  dorzolamide-timoloL (COSOPT) 22.3-6.8 mg/mL ophthalmic solution, Administer 1 drop to both eyes Two (2) times a day. , Disp: , Rfl:   ???  emtricitabine-tenofovir alafen (DESCOVY) 200-25 mg tablet, Take 1 tablet by mouth daily., Disp: 30 tablet, Rfl: 11  ???  ferrous sulfate 325 (65 FE) MG tablet, Take 325 mg by mouth daily with breakfast. , Disp: , Rfl:   ???  folic acid (FOLVITE) 1 MG tablet, , Disp: , Rfl: 0  ???  gabapentin (  NEURONTIN) 300 MG capsule, 300 mg Two (2) times a day. , Disp: , Rfl:   ???  HYDROcodone-acetaminophen (NORCO) 5-325 mg per tablet, Directions are take one tablet 30 minutes before physical therapy, then take one tablet at bedtime as needed for pain, Disp: , Rfl:   ???  nitroglycerin (NITROSTAT) 0.4 MG SL tablet, DISSOLVE ONE TABLET UNDER TONGUE AS NEEDED FOR CHEST PAIN EVERY 5 MINUTES AS DIRECTED, Disp: 25 tablet, Rfl: 0  ???  nystatin (MYCOSTATIN) 100,000 unit/mL suspension, Take 5 mL by mouth daily., Disp: , Rfl:   ???  ondansetron (ZOFRAN) 4 MG tablet, take 1 tablet by mouth every 8 hours if needed for nausea, Disp: , Rfl:   ???  oxyCODONE-acetaminophen (PERCOCET) 5-325 mg per tablet, Take 1 tablet by mouth in the morning., Disp: , Rfl:   ???  ROCKLATAN 0.02-0.005 % Drop, INT 1 GTT INTO OU QHS, Disp: , Rfl:   ???  sertraline (ZOLOFT) 50 MG tablet, take 1 tablet by mouth once daily, Disp: , Rfl:   ???  spironolactone (ALDACTONE) 25 MG tablet, Take 1 tablet (25 mg total) by mouth daily., Disp: 90 tablet, Rfl: 3  ???  tamsulosin (FLOMAX) 0.4 mg capsule, TAKE 1 CAPSULE(0.4 MG) BY MOUTH DAILY, Disp: 90 capsule, Rfl: 3  ???  atenoloL (TENORMIN) 100 MG tablet, Take 1 tablet (100 mg total) by mouth daily., Disp: 90 tablet, Rfl: 3  ???  cloNIDine HCL (CATAPRES) 0.1 MG tablet, Take 1 tablet (0.1 mg total) by mouth 3 (three) times a day., Disp: 270 tablet, Rfl: 3  ???  glimepiride (AMARYL) 4 MG tablet, Take 1 tablet (4 mg total) by mouth daily., Disp: 90 tablet, Rfl: 3  ???  lisinopriL (PRINIVIL,ZESTRIL) 40 MG tablet, Take 1 tablet (40 mg total) by mouth daily., Disp: 90 tablet, Rfl: 3  ???  SITagliptin (JANUVIA) 50 MG tablet, Take 1 tablet (50 mg total) by mouth daily., Disp: 90 tablet, Rfl: 3  ???  torsemide (DEMADEX) 20 MG tablet, Take 2 tablets (40 mg total) by mouth daily., Disp: 90 tablet, Rfl: 3      Vital Signs:     BP 124/73 (BP Site: L Arm, BP Position: Sitting)  - Pulse 57  - Temp 36.4 ??C (97.6 ??F) (Temporal)  - Ht 179.8 cm (5' 10.8) Comment: pt stated - Wt 98 kg (216 lb)  - SpO2 99%  - BMI 30.30 kg/m??   Body mass index is 30.3 kg/m??.    Physical Exam:    Normal comprehensive exam:      Constitutional:   Alert, oriented x 3, no acute distress, well nourished   Mental Status:   Thought organized, appropriate affect, normal fluent speech.   HEENT:   PEERL, conjunctiva clear, anicteric, oropharynx clear, neck supple, no LAD.   Respiratory: Clear to auscultation, and percussion to the bases, unlabored breathing.     Cardiac: Regular rate and rhythm normal S1 and S2, no murmur.      Abdomen: Soft, non-distended, non-tender, no organomegaly or masses.     Perianal/Rectal Exam Not performed.     Extremities:   No edema, well perfused.   Musculoskeletal: No joint swelling or tenderness noted, no deformities.     Skin: No rashes, jaundice or skin lesions noted.     Neuro: No focal deficits.          Diagnostic Studies:  I have reviewed all pertinent diagnostic studies, including:    GI Procedures  EGD 09/2015 no EV  Radiographic studies  Details    Reading Physician Reading Date Result Priority   Noni Saupe, MD  [phone] 06/18/2020    [facility] 06/18/2020    Jake Bathe, MD  [phone] 06/18/2020      Narrative & Impression  EXAM: US ABDOMEN COMPLETE  DATE: 06/18/2020 2:41 PM  ACCESSION: [id number] UN  DICTATED: 06/18/2020 2:48 PM  INTERPRETATION LOCATION: [facility]  ??  CLINICAL INDICATION: 71 years old Male with HCC screening. + Cirrhosis history of HCV  - K74.69 - Other cirrhosis of liver (CMS - HCC)    ??  TECHNIQUE: Static and cine images of the complete abdomen were performed.  ??  COMPARISON: CT abdomen pelvis on 03/28/2020, ultrasound abdomen 11/22/2019  ??  FINDINGS:   ??  LIVER: Visualization score: A (minimal limitations). The liver is mildly heterogeneously echogenic and measuring 15.4 cm in the craniocaudal dimension. No focal hepatic lesions. No intrahepatic biliary ductal dilatation. The common bile duct is normal in caliber.   ??  GALLBLADDER: The gallbladder is surgically absent.  ??  PANCREAS: Visualized portion is unremarkable.  ??  SPLEEN: Normal in size and echotexture, measuring 13.6 cm in craniocaudal dimension.  ??  KIDNEYS: Normal in size and echotexture. No solid masses. No hydronephrosis. Multiple bilateral renal cysts. The largest cyst on the right measures 4.9 x 5.0 x 4.8 cm at the inferior pole. The largest cyst on the left measures 3.4 x 2.8 x 3.3 cm at the superior pole. There is a nonobstructing 1.0 cm stone at the superior pole of the left kidney and nonobstructing 0.6 cm right superior pole renal stone, seen on prior.  ??  VESSELS: Partially visualized proximal aorta and IVC are unremarkable.   ??  OTHER: No ascites.  ??  IMPRESSION:  -Heterogeneous, mildly echogenic liver with nodular contour, suggesting chronic liver disease  -LI-RADS ultrasound category: [place] 1- negative.  -Visualization score: A (minimal limitations)    -Bilateral nonobstructing renal stones, similar to prior.  -Mild splenomegaly.  ??  --------------------------------------------------------------  Liver visualization score  A: Minimal limitations. Limitations are unlikely to meaningfully affect sensitivity.  B: Moderate limitations. Limitations may obscure small masses  C: Severe limitations. Limitations significantly lower sensitivity for focal liver lesions.  ??  LI-RADS ultrasound categories  [place] 1: Negative  [place] 2: Subthreshold observation  [place] 3: Positive finding  ??  Ultrasound category and visualizations score applied as outlined by the proposed [place] LI-RADS algorithm as developed by the [employer]. This is intended for use in high risk patients such as cirrhosis, or chronic hepatitis B infection.   ??  Please see below for data measurements:  Liver: 15.4 cm   ??  Common hepatic duct: 0.69 cm  Proximal common bile duct: 0.64 cm  Distal common bile duct: 0.45 cm  ??  Right kidney: 12.1 cm   Left kidney: 11.5 cm   ??  Aorta: visualized  Inferior vena cava: partially visualized  ??  Spleen: 13.6 cm       Laboratory results    Office Visit on 06/06/2021   Component Date Value Ref Range Status   ??? Sodium 06/06/2021 139  135 - 145 mmol/L Final   ??? Potassium 06/06/2021 4.3  3.4 - 4.8 mmol/L Final   ??? Chloride 06/06/2021 104  98 - 107 mmol/L Final   ??? CO2 06/06/2021 24.0  20.0 - 31.0 mmol/L Final   ??? Anion Gap 06/06/2021 11  5 - 14 mmol/L Final   ??? BUN  06/06/2021 35 (H)  9 - 23 mg/dL Final   ??? Creatinine 06/06/2021 2.81 (H)  0.60 - 1.10 mg/dL Final   ??? BUN/Creatinine Ratio 06/06/2021 12   Final   ??? eGFR CKD-EPI (2021) Male 06/06/2021 24 (L)  >=60 mL/min/1.80m2 Final   ??? Glucose 06/06/2021 174  70 - 179 mg/dL Final   ??? Calcium 16/03/9603 9.7  8.7 - 10.4 mg/dL Final   ??? Albumin 54/01/8118 4.1  3.4 - 5.0 g/dL Final   ??? Total Protein 06/06/2021 7.6  5.7 - 8.2 g/dL Final   ??? Total Bilirubin 06/06/2021 0.8  0.3 - 1.2 mg/dL Final   ??? AST 14/78/2956 58 (H)  <=34 U/L Final   ??? ALT 06/06/2021 128 (H)  10 - 49 U/L Final   ??? Alkaline Phosphatase 06/06/2021 90  46 - 116 U/L Final

## 2021-06-06 NOTE — Unmapped (Signed)
[facility] Refill Coordination Note    Specialty Medication(s) to be Shipped:   Infectious Disease: Descovy and Tivicay    Other medication(s) to be shipped: No additional medications requested for fill at this time     Samuel [name], DOB: 1950/07/19  Phone: [phone] (home)       All above HIPAA information was verified with patient.     Was a Nurse, learning disability used for this call? No    Completed refill call assessment today to schedule patient's medication shipment from the [facility] [phone]).  All relevant notes have been reviewed.     Specialty medication(s) and dose(s) confirmed: Regimen is correct and unchanged.   Changes to medications: [name] reports no changes at this time.  Changes to insurance: No  New side effects reported not previously addressed with a pharmacist or physician: None reported  Questions for the pharmacist: No    Confirmed patient received a Conservation officer, historic buildings and a Surveyor, mining with first shipment. The patient will receive a drug information handout for each medication shipped and additional FDA Medication Guides as required.       DISEASE/MEDICATION-SPECIFIC INFORMATION        N/A    SPECIALTY MEDICATION ADHERENCE     Medication Adherence    Patient reported X missed doses in the last month: 0  Specialty Medication: DESCOVY 200-25 mg  Patient is on additional specialty medications: Yes  Additional Specialty Medications: TIVICAY 50 mg    Patient Reported Additional Medication X Missed Doses in the Last Month: 0  Patient is on more than two specialty medications: Yes              Were doses missed due to medication being on hold? No    Descovy 200-25 mg: 5 days of medicine on hand   Tivicay 50 mg: 5 days of medicine on hand        REFERRAL TO PHARMACIST     Referral to the pharmacist: Not needed      [facility]     Shipping address confirmed in Epic.     Delivery Scheduled: Yes, Expected medication delivery date: 06/09/21. Medication will be delivered via Same Day Courier to the prescription address in Epic WAM.    [name]   [facility] Specialty Technician

## 2021-06-06 NOTE — Unmapped (Addendum)
Your FibroScan shows less scarring in the liver, which is great!    The FibroScan also showed that about 50% of your liver is affected by fatty changes.     SO, I'd like you to try an get down below 200 pounds, this will take the fat and inflammation out of the liver.     Currently, your liver functioning is normal, but you have a little fat in the liver causing inflammation, reflected in your elevated liver enzymes. AST/ALT/Alk Phos        Please follow a 2000mg  sodium restricted diet  Increase your protein intake by eating eggs, chicken, fish, yogurt and/or supplements.       It is OK to take up to 2000mg  of acetaminophen (tylenol), please be cautious of combination products. Do not take ibuprofen, naproxen, aspirin, advil, aleve, motrin, Excedrin or headache powders. Avoid herbal or natural supplements.      Please contact my nurse, Josie Dixon, RN at [phone] If you have questions or concerns or need to get in touch with me.       Please Return to clinic in 5-6 months       If you have any questions or concerns please contact us on MyChart or as below:     For appointments, please call [phone].  For scheduling radiology appointments [phone].  For scheduling GI procedures (e.g,. Colonoscopy), please call [phone].  For emergencies after hours call [phone] and ask for the GI medicine fellow on call.          Vallery Sa, NP-C  [facility]  [address] [place], [place] [address]  [place], [place] [address]  Ph: [phone]  Fax: [phone]

## 2021-06-06 NOTE — Unmapped (Signed)
[place] DIVISION OF GASTROENTEROLOGY AND HEPATOLOGY  [facility]    FIBROSCAN will be performed to assess hepatic fibrosis (scarring) in order to stage this patient's liver disease. This will assist with evaluating the natural course of the disease and will provide important information regarding prognosis, duration of therapy, and potential response to treatment. This information will also help assess risk for hepatocellular carcinoma and need for liver cancer surveillance.    FibroscanProcedure:  After obtaining verbal consent, the patient was placed in a supine position. Physical characteristics, body habitus, and landmarks were assessed to establish appropriate midaxillary intercostal space for probe placement.    FibroScan 630 Expert Serial # [id number]    Probe  [x]  M+ Serial # [id number]  []  XL+ Serial # [id number]    Main Etiology of Liver Disease:  [] Hepatitis C (HCV)     [] Hepatitis B (HBV)  [] Alcohol  [x] NAFLD     [] PBC       [] PSC  [] Autoimmune Hepatitis       [] Elevated hepatic enzymes       [x] Other   HIV                           Skin to liver capsule distance and liver parenchyma were accessed during the entire examination with the FibroScan probe. The patient was instructed to breathe normally and to abstain from sudden movements during the procedure, allowing for random measurements of liver stiffness. 50Hz  shear wave pulses were applied and the resulting shear wave and propagation speed detected with a 3.5 MHz ultrasonic signal, using the FibroScan probe. At least ten Shear Waves were produced; individual measurements of each shear wave were calculated. Patient tolerated the procedure well and was discharged without incident.    FibroScan score __5.3__ kPa   IQR/Med __13__ %   CAP __264__ dB/m     Test performed by: Valentina Lucks RN    --------------------------------------------------------------------  RESULTS REPORT - Physician???s interpretation    Estimation of the stage of liver fibrosis (Metavir Score): The results of the Liver Stiffness Score are consistent with the following liver fibrosis stage:    [x]  F0-F1  []  F2   []  F3   [] F4           GENERAL RECOMMENDATIONS ACCORDING TO THE STAGE OF LIVER FIBROSIS.    F0-F1: No-minimal fibrosis. The risk of progression to advanced fibrosis and cirrhosis is low. If the cause of liver disease is not removed, a 1-2 yr follow-up study is recommended. F2: Significant fibrosis. There is a moderate risk of progression to cirrhosis. If the cause of liver disease is not removed, a follow-up study in 12 months is recommended.  F3: Advanced (pre-cirrhotic stage). The risk of progression to cirrhosis is high. Imaging studies to rule out hepatocellular carcinoma should be considered. Efforts to remove the cause of liver disease are highly recommended.  F4: Cirrhosis. There is significant risk of portal hypertension and esophageal varices. An upper endoscopy is recommended. Imaging studies for hepatocellular carcinoma screening are recommended.    Any and all FibroScan studies must be carefully evaluated, taking fully into account all individual measurement/scans, patient history and other factors. As with liver biopsy, any estimation of liver fibrosis may be subject to under or over staging due to sampling error. Any further medical or surgical intervention should be made only while fully considering the circumstances of this patient and in consultation with this patient.    I have reviewed  and interpreted the FIBROSCAN test results as described above.     F 0-1 fibrosis  S 2 (33-66%) steatosis  A. Yolonda Kida, MD, [facility]  Professor of Medicine  Division of Gastroenterology and Hepatology  [facility] [place] [facility]  [address]  [id number]  [place], [place] [address]    Phone [phone]  Fax [phone]

## 2021-06-09 MED FILL — TIVICAY 50 MG TABLET: ORAL | 30 days supply | Qty: 30 | Fill #3

## 2021-06-09 MED FILL — DESCOVY 200 MG-25 MG TABLET: ORAL | 30 days supply | Qty: 30 | Fill #3

## 2021-06-13 NOTE — Unmapped (Unsigned)
HISTORY OF PRESENT ILLNESS:      [name] is a 71 year old man with stage G4:A2 chronic kidney disease who is evaluated today for stage G4:A2 chronic kidney disease.       He continues to have relatively stable kidney function. He saw Dr. Tomasa Hose last week, who changed his HAART to abacavir, dolutegravir, and lamivudine.  He has not had chest pain.  He notes recurrence of diarrhea over the past 7 to 10 days.  He indicates that he did respond to a brief course of metronidazole 3 months ago for presumed bacterial overgrowth.  His recent labs are significant for a slight increase in transaminases.  He has follow-up with hepatology in the near future.  He does not have increased leg edema.  He is trying to increase his exercise.  Home blood pressure readings are averaging about 130/80 mmHg.     MEDICATIONS:    Current Outpatient Medications   Medication Sig Dispense Refill   ??? abacavir (ZIAGEN) 300 mg tablet Take 2 tablets (600 mg total) by mouth daily. 60 tablet 11   ??? albuterol (PROVENTIL HFA;VENTOLIN HFA) 90 mcg/actuation inhaler Inhale 2 puffs. Take as needed     ??? allopurinoL (ZYLOPRIM) 100 MG tablet Take 250 mg by mouth daily.      ??? amLODIPine (NORVASC) 10 MG tablet Take 1 tablet (10 mg total) by mouth daily. 90 tablet 3   ??? aspirin (ECOTRIN) 81 MG tablet daily.   0   ??? atenoloL (TENORMIN) 100 MG tablet Take 1 tablet (100 mg total) by mouth daily. 90 tablet 3   ??? atorvastatin (LIPITOR) 40 MG tablet      ??? BACLOFEN, BULK, TOP Apply topically. Baclofen 2%, Diclofenac 5%, Gabapentin 6%, Tetracaine 3%    Take as needed     ??? blood sugar diagnostic (ONETOUCH VERIO TEST STRIPS) Strp Use 2 (two) times daily E11.22     ??? calcium carbonate-vitamin D2 500 mg(1,250mg ) -200 unit tablet Take 1 tablet by mouth Two (2) times a day.     ??? carvediloL (COREG) 12.5 MG tablet 12.5 mg Two (2) times a day.      ??? cholecalciferol, vitamin D3 25 mcg, 1,000 units,, 1,000 unit (25 mcg) tablet Take by mouth daily.     ??? cloNIDine HCL (CATAPRES) 0.1 MG tablet Take 1 tablet (0.1 mg total) by mouth 3 (three) times a day. 270 tablet 3   ??? diazepam (VALIUM) 5 MG tablet Take 5 mg by mouth nightly.      ??? dicyclomine (BENTYL) 10 mg capsule Take 10 mg by mouth two (2) times a day as needed.      ??? docusate sodium (COLACE) 100 MG capsule Take 1 capsule (100 mg total) by mouth every twelve (12) hours. (Patient taking differently: Take 100 mg by mouth every twelve (12) hours. Take as needed) 60 capsule 0   ??? dolutegravir (TIVICAY) 50 mg TABLET Take 1 tablet (50 mg total) by mouth daily. 30 tablet 11   ??? dorzolamide-timoloL (COSOPT) 22.3-6.8 mg/mL ophthalmic solution Administer 1 drop to both eyes Two (2) times a day.      ??? emtricitabine-tenofovir alafen (DESCOVY) 200-25 mg tablet Take 1 tablet by mouth daily. 30 tablet 11   ??? ferrous sulfate 325 (65 FE) MG tablet Take 325 mg by mouth daily with breakfast.      ??? folic acid (FOLVITE) 1 MG tablet   0   ??? gabapentin (NEURONTIN) 300 MG capsule 300 mg Two (2) times a  day.      ??? glimepiride (AMARYL) 4 MG tablet Take 1 tablet (4 mg total) by mouth daily. 90 tablet 3   ??? HYDROcodone-acetaminophen (NORCO) 5-325 mg per tablet Directions are take one tablet 30 minutes before physical therapy, then take one tablet at bedtime as needed for pain     ??? lisinopriL (PRINIVIL,ZESTRIL) 40 MG tablet Take 1 tablet (40 mg total) by mouth daily. 90 tablet 3   ??? nitroglycerin (NITROSTAT) 0.4 MG SL tablet DISSOLVE ONE TABLET UNDER TONGUE AS NEEDED FOR CHEST PAIN EVERY 5 MINUTES AS DIRECTED 25 tablet 0   ??? nystatin (MYCOSTATIN) 100,000 unit/mL suspension Take 5 mL by mouth daily.     ??? ondansetron (ZOFRAN) 4 MG tablet take 1 tablet by mouth every 8 hours if needed for nausea     ??? oxyCODONE-acetaminophen (PERCOCET) 5-325 mg per tablet Take 1 tablet by mouth in the morning.     ??? ROCKLATAN 0.02-0.005 % Drop INT 1 GTT INTO OU QHS     ??? sertraline (ZOLOFT) 50 MG tablet take 1 tablet by mouth once daily     ??? SITagliptin (JANUVIA) 50 MG tablet Take 1 tablet (50 mg total) by mouth daily. 90 tablet 3   ??? spironolactone (ALDACTONE) 25 MG tablet Take 1 tablet (25 mg total) by mouth daily. 90 tablet 3   ??? tamsulosin (FLOMAX) 0.4 mg capsule TAKE 1 CAPSULE(0.4 MG) BY MOUTH DAILY 90 capsule 3   ??? torsemide (DEMADEX) 20 MG tablet Take 2 tablets (40 mg total) by mouth daily. 90 tablet 3     No current facility-administered medications for this visit.         REVIEW OF SYSTEMS:  Constitutional: No fevers or chills. Cardiovascular: No chest pain.  Pulmonary:  All other systems are reviewed and are negative except that noted in the history of present illness.    PHYSICAL EXAMINATION:   ??  Constitutional: He is in no acute distress.  Vital signs:   HEENT: Masked.    Neck: Supple without lymphadenopathy or thyromegaly. No carotid bruits.   Lungs: Clear to auscultation   Heart: Regular rate and rhythm. Normal S1-S2.   Abdomen: Soft, nontender, normoactive bowel sounds, without organomegaly or mass. No abdominal bruits. ????  Neurologic: Alert and oriented x 3.????Strength is normal. ??DTRs are 2+ and symmetric. ??There are no lateralizing sensory deficits.  Extremities: Trace pedal edema. Dorsalis pedis and posterior tibial pulses are palpable. ??  Node survey: No lymphadenopathy.   Musculoskeletal: No active synovitis.??  Skin/Nails: ??No rash or nail changes.??     LABORATORY STUDIES:    IMAGING:      IMPRESSION AND PLAN:    1.  Stage G4:A2 chronic kidney disease.      Continues to have stable kidney function.  His home readings indicate that he is at goal.  He will continue with his current antihypertensive regimen.  ??  2.  Hypertension.      We will continue amlodipine 10 mg daily, lisinopril 40 mg once daily, hydralazine 100 mg twice daily, atenolol 100 mg once daily, doxazosin 4 mg daily, torsemide 40 mg once daily, spironolactone 25 mg once daily.    3.  Human immunodeficiency virus disease.      He will continue abacavir, dolutegravir, and lamivudine under the care of Dr. Kirstie Peri.      4.  Lower urinary tract symptoms.      Continue tamsulosin.    5.  Secondary/tertiary hyperparathyroidism.  His biochemistries are acceptable.    6.  Nephrolithiasis.        He is asymptomatic.    7.  Mild sleep apnea.      We will check in with Dr. Marcello Fennel to ascertain the status of his CPAP titration study.    8.  Presumed bacterial overgrowth in the setting of diabetes mellitus.      Will avoid metronidazole because of the increased transaminases.  He will be treated with a 1 week course of oral doxycycline.    9.  Type 2 diabetes mellitus.      He will continue glimepiride.  In the future, consideration can be given to a GLP-1 receptor agonist versus an SGLT2 inhibitor.    10. Follow up plans.  He will return to see me in about 3 months.        Zetta Bills. Stefano Gaul, MD  Date of service 06/17/2021

## 2021-06-17 ENCOUNTER — Ambulatory Visit: Admit: 2021-06-17 | Payer: MEDICARE | Attending: Nephrology | Primary: Nephrology

## 2021-06-21 NOTE — Unmapped (Addendum)
HISTORY OF PRESENT ILLNESS:      Samuel [name] is a 71 year old man with stage G4:A2 chronic kidney disease who is evaluated today for stage G4:A2 chronic kidney disease.  His overall clinical status remains quite stable.  He became somewhat despondent about recent life losses including his sister passing away and his brother sustaining a recent stroke.  He has asked himself Am I next?  He uses his faith to get through the day and indicates that he is coping fairly well.  He does not have increasing depressed mood or anxiety.  He has not had increased leg edema and his blood pressures been well controlled.  He recently completed a FibroScan and was graded at 0 to F1.  He saw Illene Regulus recently who indicated that nonalcoholic steatohepatitis likely accounts for his persistent liver enzyme elevations.  She recommended lifestyle modification with 5% weight loss, annual ultrasounds, and diabetes control.  He recently completed 2 local injections to help ameliorate his chronic low back pain.    MEDICATIONS:    Current Outpatient Medications   Medication Sig Dispense Refill   ??? abacavir (ZIAGEN) 300 mg tablet Take 2 tablets (600 mg total) by mouth daily. 60 tablet 11   ??? albuterol (PROVENTIL HFA;VENTOLIN HFA) 90 mcg/actuation inhaler Inhale 2 puffs. Take as needed     ??? allopurinoL (ZYLOPRIM) 100 MG tablet Take 250 mg by mouth daily.      ??? amLODIPine (NORVASC) 10 MG tablet Take 1 tablet (10 mg total) by mouth daily. 90 tablet 3   ??? aspirin (ECOTRIN) 81 MG tablet daily.   0   ??? atenoloL (TENORMIN) 100 MG tablet Take 1 tablet (100 mg total) by mouth daily. 90 tablet 3   ??? atorvastatin (LIPITOR) 40 MG tablet      ??? BACLOFEN, BULK, TOP Apply topically. Baclofen 2%, Diclofenac 5%, Gabapentin 6%, Tetracaine 3%    Take as needed     ??? blood sugar diagnostic (ONETOUCH VERIO TEST STRIPS) Strp Use 2 (two) times daily E11.22     ??? calcium carbonate-vitamin D2 500 mg(1,250mg ) -200 unit tablet Take 1 tablet by mouth Two (2) times a day.     ??? carvediloL (COREG) 12.5 MG tablet 12.5 mg Two (2) times a day.      ??? cholecalciferol, vitamin D3 25 mcg, 1,000 units,, 1,000 unit (25 mcg) tablet Take by mouth daily.     ??? cloNIDine HCL (CATAPRES) 0.1 MG tablet Take 1 tablet (0.1 mg total) by mouth 3 (three) times a day. 270 tablet 3   ??? diazepam (VALIUM) 5 MG tablet Take 5 mg by mouth nightly.      ??? dicyclomine (BENTYL) 10 mg capsule Take 10 mg by mouth two (2) times a day as needed.      ??? docusate sodium (COLACE) 100 MG capsule Take 1 capsule (100 mg total) by mouth every twelve (12) hours. (Patient taking differently: Take 100 mg by mouth every twelve (12) hours. Take as needed) 60 capsule 0   ??? dolutegravir (TIVICAY) 50 mg TABLET Take 1 tablet (50 mg total) by mouth daily. 30 tablet 11   ??? dorzolamide-timoloL (COSOPT) 22.3-6.8 mg/mL ophthalmic solution Administer 1 drop to both eyes Two (2) times a day.      ??? emtricitabine-tenofovir alafen (DESCOVY) 200-25 mg tablet Take 1 tablet by mouth daily. 30 tablet 11   ??? ferrous sulfate 325 (65 FE) MG tablet Take 325 mg by mouth daily with breakfast.      ???  folic acid (FOLVITE) 1 MG tablet   0   ??? gabapentin (NEURONTIN) 300 MG capsule 300 mg Two (2) times a day.      ??? glimepiride (AMARYL) 4 MG tablet Take 1 tablet (4 mg total) by mouth daily. 90 tablet 3   ??? HYDROcodone-acetaminophen (NORCO) 5-325 mg per tablet Directions are take one tablet 30 minutes before physical therapy, then take one tablet at bedtime as needed for pain     ??? lisinopriL (PRINIVIL,ZESTRIL) 40 MG tablet Take 1 tablet (40 mg total) by mouth daily. 90 tablet 3   ??? nitroglycerin (NITROSTAT) 0.4 MG SL tablet DISSOLVE ONE TABLET UNDER TONGUE AS NEEDED FOR CHEST PAIN EVERY 5 MINUTES AS DIRECTED 25 tablet 0   ??? nystatin (MYCOSTATIN) 100,000 unit/mL suspension Take 5 mL by mouth daily.     ??? ondansetron (ZOFRAN) 4 MG tablet take 1 tablet by mouth every 8 hours if needed for nausea     ??? oxyCODONE-acetaminophen (PERCOCET) 5-325 mg per tablet Take 1 tablet by mouth in the morning.     ??? ROCKLATAN 0.02-0.005 % Drop INT 1 GTT INTO OU QHS     ??? sertraline (ZOLOFT) 50 MG tablet take 1 tablet by mouth once daily     ??? SITagliptin (JANUVIA) 50 MG tablet Take 1 tablet (50 mg total) by mouth daily. 90 tablet 3   ??? spironolactone (ALDACTONE) 25 MG tablet Take 1 tablet (25 mg total) by mouth daily. 90 tablet 3   ??? tamsulosin (FLOMAX) 0.4 mg capsule TAKE 1 CAPSULE(0.4 MG) BY MOUTH DAILY 90 capsule 3   ??? torsemide (DEMADEX) 20 MG tablet Take 2 tablets (40 mg total) by mouth daily. 90 tablet 3     No current facility-administered medications for this visit.         REVIEW OF SYSTEMS:  Constitutional: No fevers or chills. Cardiovascular: No chest pain.  Pulmonary:  All other systems are reviewed and are negative except that noted in the history of present illness.    PHYSICAL EXAMINATION:   ??  Constitutional: He is in no acute distress.  Vital signs: BP 130/80 (BP Site: L Arm, BP Position: Sitting, BP Cuff Size: Large)  - Pulse 64  - Temp 36.5 ??C (97.7 ??F) (Temporal)  - Wt 96.2 kg (212 lb)  - BMI 29.74 kg/m??   Wt Readings from Last 3 Encounters:   06/24/21 96.2 kg (212 lb)   06/06/21 98 kg (216 lb)   03/04/21 96.1 kg (211 lb 12.8 oz)   HEENT: Masked.    Neck: Supple without lymphadenopathy or thyromegaly. No carotid bruits.   Lungs: Clear to auscultation   Heart: Regular rate and rhythm. Normal S1-S2.   Abdomen: Soft, nontender, normoactive bowel sounds.  Liver span is about 9 cm to percussion.  Neurologic: Alert and oriented x 3.????Strength is normal. ??DTRs are 2+ and symmetric. ??There are no lateralizing sensory deficits.  Extremities: Trace pedal edema bilaterally. Dorsalis pedis and posterior tibial pulses are palpable. ??  Node survey: No lymphadenopathy.   Musculoskeletal: No active synovitis.??  Skin/Nails: ??No rash or nail changes.??     LABORATORY STUDIES:    Recent Results (from the past 672 hour(s))   PT-INR    Collection Time: 06/06/21 10:21 AM   Result Value Ref Range    PT 12.2 9.8 - 12.8 sec    INR 1.08    Gamma GT (GGT)    Collection Time: 06/06/21 10:21 AM   Result Value Ref Range  GGT 75 (H) 0 - 73 U/L   Comprehensive metabolic panel    Collection Time: 06/06/21 10:21 AM   Result Value Ref Range    Sodium 139 135 - 145 mmol/L    Potassium 4.3 3.4 - 4.8 mmol/L    Chloride 104 98 - 107 mmol/L    CO2 24.0 20.0 - 31.0 mmol/L    Anion Gap 11 5 - 14 mmol/L    BUN 35 (H) 9 - 23 mg/dL    Creatinine 1.61 (H) 0.60 - 1.10 mg/dL    BUN/Creatinine Ratio 12     eGFR CKD-EPI (2021) Male 24 (L) >=60 mL/min/1.9m2    Glucose 174 70 - 179 mg/dL    Calcium 9.7 8.7 - 09.6 mg/dL    Albumin 4.1 3.4 - 5.0 g/dL    Total Protein 7.6 5.7 - 8.2 g/dL    Total Bilirubin 0.8 0.3 - 1.2 mg/dL    AST 58 (H) <=04 U/L    ALT 128 (H) 10 - 49 U/L    Alkaline Phosphatase 90 46 - 116 U/L   Hepatitis C RNA, Quantitative, PCR    Collection Time: 06/06/21 10:21 AM   Result Value Ref Range    HCV RNA Not Detected Not Detected     Creatinine   Date Value Ref Range Status   06/06/2021 2.81 (H) 0.60 - 1.10 mg/dL Final   54/01/8118 1.47 (H) 0.60 - 1.10 mg/dL Final   82/95/6213 0.86 (H) 0.60 - 1.10 mg/dL Final   57/84/6962 9.52 (H) 0.76 - 1.27 mg/dL Final   84/13/2440 1.02 (H) 0.76 - 1.27 mg/dL Final   72/53/6644 0.34 (H) 0.60 - 1.10 mg/dL Final   74/25/9563 8.75 (H) 0.60 - 1.10 mg/dL Final   64/33/2951 8.84 (H) 0.76 - 1.27 mg/dL Final   16/60/6301 6.01 (H) 0.76 - 1.27 mg/dL Final   09/32/3557 3.22 (H) 0.60 - 1.10 mg/dL Final   02/54/2706 2.37 (H) 0.60 - 1.10 mg/dL Final   62/83/1517 6.16 (H) 0.76 - 1.27 mg/dL Final   07/37/1062 6.94 (H) 0.76 - 1.27 mg/dL Final   85/46/2703 5.00 (H) 0.76 - 1.27 mg/dL Final     IMAGING:    I reviewed the abdominal pelvic CT from October 2021.  A 4 mm right lower lobe pulmonary nodule was noted.      IMPRESSION AND PLAN:    1.  Stage G4:A2 chronic kidney disease.  His kidney function is relatively stable.  The status of his transplant evaluation is not clear.  I have reached out to the transplant team to ascertain next best steps in his evaluation.  ??  2.  Hypertension.  His blood pressure is at goal on a rather complicated regimen that includes amlodipine 10 mg daily, lisinopril 40 mg once daily, hydralazine 100 mg twice daily, atenolol 100 mg once daily, doxazosin 4 mg daily, torsemide 40 mg once daily, spironolactone 25 mg once daily.    3.  Human immunodeficiency virus disease.  He is doing well on abacavir, dolutegravir, and lamivudine under the astute care of Dr. Kirstie Peri.      4.  Lower urinary tract symptoms.  He will continue tamsulosin.    5.  Secondary/tertiary hyperparathyroidism.  His serum calcium level is acceptable.    6.  Nephrolithiasis.  This currently is not active.       7.  Type 2 diabetes mellitus.  He will continue to follow-up with Dr. Marcello Fennel on this issue.  I think a GLP-1 receptor agonist may  be a good choice in the future to help facilitate weight loss and medication of metabolic syndrome.    8.  4 mm RLL pulmonary nodule. Since he has a history of smoking and hence increased risk, will check chest CT to follow up on this nodule.     9.  Nonalcoholic steatohepatitis.  Weight loss and exercise recommended.    10. Chronic low back pain.  He has ongoing follow-up with a spine specialist in [place].    11. Follow up plans.  He will return to see me in about 3 months.        Zetta Bills. Stefano Gaul, MD  Date of service 06/24/2021    Addendum:    He needs a colonoscopy to complete his evaluation. I called the patient to explain the need for the study. He agrees to proceed. I have placed the referral. To note, he had a negative Cologuard in June 2022.     Zetta Bills. Stefano Gaul, MD  Date of service 06/27/2021    Addendum:    Chest CT shows stability of the right pulmonary nodule.  However, it showed increased bilateral lower lobe groundglass and reticular abnormality may represent changes of infectious or organizing pneumonia (including autoimmune or medication-related etiologies).  Patient has minimal symptoms of perhaps a slight increase in dyspnea on exertion over the past several months.  He had COVID infection about 6 to 8 months ago with relatively mild symptoms.  He does not have fever, chills, weight loss, or chronic cough.  I placed a referral to pulmonary medicine to help with the evaluation of this finding.    Zetta Bills. Stefano Gaul, MD  Date of service 07/07/2020

## 2021-06-24 ENCOUNTER — Ambulatory Visit: Admit: 2021-06-24 | Discharge: 2021-06-25 | Payer: MEDICARE | Attending: Nephrology | Primary: Nephrology

## 2021-06-24 DIAGNOSIS — I5033 Acute on chronic diastolic (congestive) heart failure: Principal | ICD-10-CM

## 2021-06-24 DIAGNOSIS — R911 Solitary pulmonary nodule: Principal | ICD-10-CM

## 2021-06-24 DIAGNOSIS — I2510 Atherosclerotic heart disease of native coronary artery without angina pectoris: Principal | ICD-10-CM

## 2021-06-24 DIAGNOSIS — B182 Chronic viral hepatitis C: Principal | ICD-10-CM

## 2021-06-24 DIAGNOSIS — B20 Human immunodeficiency virus [HIV] disease: Principal | ICD-10-CM

## 2021-06-24 DIAGNOSIS — I214 Non-ST elevation (NSTEMI) myocardial infarction: Principal | ICD-10-CM

## 2021-06-24 DIAGNOSIS — E8770 Fluid overload, unspecified: Principal | ICD-10-CM

## 2021-06-24 DIAGNOSIS — N184 Chronic kidney disease, stage 4 (severe): Principal | ICD-10-CM

## 2021-06-24 MED ORDER — TORSEMIDE 20 MG TABLET
ORAL_TABLET | Freq: Every day | ORAL | 3 refills | 45.00000 days | Status: CP
Start: 2021-06-24 — End: 2022-06-24

## 2021-06-24 NOTE — Unmapped (Addendum)
Continue current medications  Return visit in 3 months  Chest CT to follow up on the lung nodule-this is most likely benign.   I will follow up with the transplant evaluation

## 2021-06-25 DIAGNOSIS — Z1211 Encounter for screening for malignant neoplasm of colon: Principal | ICD-10-CM

## 2021-06-25 DIAGNOSIS — Z1212 Encounter for screening for malignant neoplasm of rectum: Principal | ICD-10-CM

## 2021-07-02 NOTE — Unmapped (Signed)
[facility] Refill Coordination Note    Specialty Medication(s) to be Shipped:   Infectious Disease: Descovy and Tivicay    Other medication(s) to be shipped: No additional medications requested for fill at this time     [name], DOB: 1950/07/02  Phone: There are no phone numbers on file.      All above HIPAA information was verified with patient.     Was a Nurse, learning disability used for this call? No    Completed refill call assessment today to schedule patient's medication shipment from the [facility] [phone]).  All relevant notes have been reviewed.     Specialty medication(s) and dose(s) confirmed: Regimen is correct and unchanged.   Changes to medications: Criston reports no changes at this time.  Changes to insurance: No  New side effects reported not previously addressed with a pharmacist or physician: None reported  Questions for the pharmacist: No    Confirmed patient received a Conservation officer, historic buildings and a Surveyor, mining with first shipment. The patient will receive a drug information handout for each medication shipped and additional FDA Medication Guides as required.       DISEASE/MEDICATION-SPECIFIC INFORMATION        N/A    SPECIALTY MEDICATION ADHERENCE     Medication Adherence    Patient reported X missed doses in the last month: 0  Specialty Medication: descovy  Patient is on additional specialty medications: Yes  Additional Specialty Medications: tivicay  Patient Reported Additional Medication X Missed Doses in the Last Month: 0              Were doses missed due to medication being on hold? No    tivicay  : 7 days of medicine on hand   descovy  : 7 days of medicine on hand       REFERRAL TO PHARMACIST     Referral to the pharmacist: Not needed      [facility]     Shipping address confirmed in Epic.     Delivery Scheduled: Yes, Expected medication delivery date: 2/8.     Medication will be delivered via Next Day Courier to the prescription address in Epic WAM.    [name]   [facility] Specialty Technician

## 2021-07-05 ENCOUNTER — Ambulatory Visit: Admit: 2021-07-05 | Discharge: 2021-07-06 | Payer: MEDICARE

## 2021-07-07 DIAGNOSIS — J8489 Other specified interstitial pulmonary diseases: Principal | ICD-10-CM

## 2021-07-08 MED FILL — TIVICAY 50 MG TABLET: ORAL | 30 days supply | Qty: 30 | Fill #4

## 2021-07-08 MED FILL — DESCOVY 200 MG-25 MG TABLET: ORAL | 30 days supply | Qty: 30 | Fill #4

## 2021-07-31 NOTE — Unmapped (Signed)
[facility] Clinical Assessment & Refill Coordination Note    Samuel [name], DOB: Aug 30, 1950  Phone: There are no phone numbers on file.    All above HIPAA information was verified with patient.     Was a Nurse, learning disability used for this call? No    Specialty Medication(s):   Infectious Disease: Descovy and Tivicay     Current Outpatient Medications   Medication Sig Dispense Refill   ??? abacavir (ZIAGEN) 300 mg tablet Take 2 tablets (600 mg total) by mouth daily. 60 tablet 11   ??? albuterol (PROVENTIL HFA;VENTOLIN HFA) 90 mcg/actuation inhaler Inhale 2 puffs. Take as needed     ??? allopurinoL (ZYLOPRIM) 100 MG tablet Take 250 mg by mouth daily.      ??? amLODIPine (NORVASC) 10 MG tablet Take 1 tablet (10 mg total) by mouth daily. 90 tablet 3   ??? aspirin (ECOTRIN) 81 MG tablet daily.   0   ??? atenoloL (TENORMIN) 100 MG tablet Take 1 tablet (100 mg total) by mouth daily. 90 tablet 3   ??? atorvastatin (LIPITOR) 40 MG tablet      ??? BACLOFEN, BULK, TOP Apply topically. Baclofen 2%, Diclofenac 5%, Gabapentin 6%, Tetracaine 3%    Take as needed     ??? blood sugar diagnostic (ONETOUCH VERIO TEST STRIPS) Strp Use 2 (two) times daily E11.22     ??? calcium carbonate-vitamin D2 500 mg(1,250mg ) -200 unit tablet Take 1 tablet by mouth Two (2) times a day.     ??? carvediloL (COREG) 12.5 MG tablet 12.5 mg Two (2) times a day.      ??? cholecalciferol, vitamin D3 25 mcg, 1,000 units,, 1,000 unit (25 mcg) tablet Take by mouth daily.     ??? cloNIDine HCL (CATAPRES) 0.1 MG tablet Take 1 tablet (0.1 mg total) by mouth 3 (three) times a day. 270 tablet 3   ??? diazepam (VALIUM) 5 MG tablet Take 5 mg by mouth nightly.      ??? dicyclomine (BENTYL) 10 mg capsule Take 10 mg by mouth two (2) times a day as needed.      ??? docusate sodium (COLACE) 100 MG capsule Take 1 capsule (100 mg total) by mouth every twelve (12) hours. (Patient taking differently: Take 100 mg by mouth every twelve (12) hours. Take as needed) 60 capsule 0   ??? dolutegravir (TIVICAY) 50 mg TABLET Take 1 tablet (50 mg total) by mouth daily. 30 tablet 11   ??? dorzolamide-timoloL (COSOPT) 22.3-6.8 mg/mL ophthalmic solution Administer 1 drop to both eyes Two (2) times a day.      ??? emtricitabine-tenofovir alafen (DESCOVY) 200-25 mg tablet Take 1 tablet by mouth daily. 30 tablet 11   ??? ferrous sulfate 325 (65 FE) MG tablet Take 325 mg by mouth daily with breakfast.      ??? folic acid (FOLVITE) 1 MG tablet   0   ??? gabapentin (NEURONTIN) 300 MG capsule 300 mg Two (2) times a day.      ??? glimepiride (AMARYL) 4 MG tablet Take 1 tablet (4 mg total) by mouth daily. 90 tablet 3   ??? HYDROcodone-acetaminophen (NORCO) 5-325 mg per tablet Directions are take one tablet 30 minutes before physical therapy, then take one tablet at bedtime as needed for pain     ??? lisinopriL (PRINIVIL,ZESTRIL) 40 MG tablet Take 1 tablet (40 mg total) by mouth daily. 90 tablet 3   ??? nitroglycerin (NITROSTAT) 0.4 MG SL tablet DISSOLVE ONE TABLET UNDER  TONGUE AS NEEDED FOR CHEST PAIN EVERY 5 MINUTES AS DIRECTED 25 tablet 0   ??? nystatin (MYCOSTATIN) 100,000 unit/mL suspension Take 5 mL by mouth daily.     ??? ondansetron (ZOFRAN) 4 MG tablet take 1 tablet by mouth every 8 hours if needed for nausea     ??? oxyCODONE-acetaminophen (PERCOCET) 5-325 mg per tablet Take 1 tablet by mouth in the morning.     ??? ROCKLATAN 0.02-0.005 % Drop INT 1 GTT INTO OU QHS     ??? sertraline (ZOLOFT) 50 MG tablet take 1 tablet by mouth once daily     ??? SITagliptin (JANUVIA) 50 MG tablet Take 1 tablet (50 mg total) by mouth daily. 90 tablet 3   ??? spironolactone (ALDACTONE) 25 MG tablet Take 1 tablet (25 mg total) by mouth daily. 90 tablet 3   ??? tamsulosin (FLOMAX) 0.4 mg capsule TAKE 1 CAPSULE(0.4 MG) BY MOUTH DAILY 90 capsule 3   ??? torsemide (DEMADEX) 20 MG tablet Take 2 tablets (40 mg total) by mouth daily. 90 tablet 3     No current facility-administered medications for this visit.        Changes to medications: Samuel [name] reports no changes at this time.    Allergies   Allergen Reactions   ??? Colchicine Analogues Diarrhea     Have diarrhea when taken for long periods of time   ??? Tramadol Other (See Comments)     unknown tolerates morphine       Changes to allergies: No    SPECIALTY MEDICATION ADHERENCE     Descovy 200-25 mg: unknown number of  days of medicine on hand.  Based on refill dates, I estimate around 11 days.  Tivicay 50 mg: unknown number of  days of medicine on hand.  Based on refill dates, I estimate around 11 days.      Medication Adherence    Patient reported X missed doses in the last month: 0  Specialty Medication: Descovy 200-25mg   Patient is on additional specialty medications: Yes  Additional Specialty Medications: Tivicay 50mg   Patient Reported Additional Medication X Missed Doses in the Last Month: 0  Patient is on more than two specialty medications: No  Any gaps in refill history greater than 2 weeks in the last 3 months: no  Demonstrates understanding of importance of adherence: yes  Informant: patient  Provider-estimated medication adherence level: good  Patient is at risk for Non-Adherence: No          Specialty medication(s) dose(s) confirmed: Regimen is correct and unchanged.     Are there any concerns with adherence? No    Adherence counseling provided? Not needed    CLINICAL MANAGEMENT AND INTERVENTION      Clinical Benefit Assessment:    Do you feel the medicine is effective or helping your condition? Yes    HIV ASSOCIATED LABS:     Lab Results   Component Value Date/Time    HIVRS Not Detected 02/26/2021 10:18 AM    HIVRS Not Detected 01/29/2021 10:39 AM    HIVRS Detected (A) 09/04/2020 10:17 AM    HIVRS Not Detected 08/15/2014 11:42 AM    HIVRS Not Detected 01/03/2014 10:46 AM    HIVRS Not Detected 11/23/2012 12:53 PM    HIVCP <40 01/02/2021 10:26 AM    HIVCP <40 (H) 09/04/2020 10:17 AM    HIVCP <40 05/09/2020 03:11 PM    HIVCP <40 (H) 12/26/2019 10:24 AM    HIVCP <40 05/16/2019 02:24 PM  ACD4 720 02/26/2021 10:18 AM    ACD4 578 01/29/2021 10:39 AM    ACD4 735 09/04/2020 10:17 AM    ACD4 514 08/01/2014 09:35 AM    ACD4 430 (L) 01/03/2014 10:46 AM    ACD4 549 11/23/2012 12:53 PM       Clinical Benefit counseling provided? Labs from 02/26/21 show evidence of clinical benefit    Adverse Effects Assessment:    Are you experiencing any side effects? No    Are you experiencing difficulty administering your medicine? No    Quality of Life Assessment:    How many days over the past month did your HIV keep you from your normal activities? For example, brushing your teeth or getting up in the morning. 0    Have you discussed this with your provider? Not needed    Acute Infection Status:    Acute infections noted within Epic:  No active infections  Patient reported infection: None    Therapy Appropriateness:    Is therapy appropriate and patient progressing towards therapeutic goals? Yes, therapy is appropriate and should be continued    DISEASE/MEDICATION-SPECIFIC INFORMATION      N/A    PATIENT SPECIFIC NEEDS     - Does the patient have any physical, cognitive, or cultural barriers? No    - Is the patient high risk? No    - Does the patient require a Care Management Plan? No         SHIPPING     Specialty Medication(s) to be Shipped:   Infectious Disease: Descovy and Tivicay    Other medication(s) to be shipped: No additional medications requested for fill at this time     Changes to insurance: No    Delivery Scheduled: Yes, Expected medication delivery date: 08/05/21.     Medication will be delivered via Next Day Courier to the confirmed prescription address in [facility].    The patient will receive a drug information handout for each medication shipped and additional FDA Medication Guides as required.  Verified that patient has previously received a Conservation officer, historic buildings and a Surveyor, mining.    The patient or caregiver noted above participated in the development of this care plan and knows that they can request review of or adjustments to the care plan at any time.      All of the patient's questions and concerns have been addressed.    Samuel [name]   [facility] Specialty Pharmacist

## 2021-08-04 DIAGNOSIS — B20 Human immunodeficiency virus [HIV] disease: Principal | ICD-10-CM

## 2021-08-04 MED FILL — DESCOVY 200 MG-25 MG TABLET: ORAL | 30 days supply | Qty: 30 | Fill #5

## 2021-08-04 MED FILL — TIVICAY 50 MG TABLET: ORAL | 30 days supply | Qty: 30 | Fill #5

## 2021-08-19 ENCOUNTER — Ambulatory Visit: Admit: 2021-08-19 | Payer: MEDICARE

## 2021-08-29 NOTE — Unmapped (Signed)
[facility] Refill Coordination Note    Specialty Medication(s) to be Shipped:   Infectious Disease: Descovy and Tivicay    Other medication(s) to be shipped: No additional medications requested for fill at this time     Samuel [name], DOB: 07/12/1950  Phone: There are no phone numbers on file.      All above HIPAA information was verified with patient.     Was a Nurse, learning disability used for this call? No    Completed refill call assessment today to schedule patient's medication shipment from the [facility] [phone]).  All relevant notes have been reviewed.     Specialty medication(s) and dose(s) confirmed: Regimen is correct and unchanged.   Changes to medications: Samuel [name] reports no changes at this time.  Changes to insurance: No  New side effects reported not previously addressed with a pharmacist or physician: None reported  Questions for the pharmacist: No    Confirmed patient received a Conservation officer, historic buildings and a Surveyor, mining with first shipment. The patient will receive a drug information handout for each medication shipped and additional FDA Medication Guides as required.       DISEASE/MEDICATION-SPECIFIC INFORMATION        N/A    SPECIALTY MEDICATION ADHERENCE     Medication Adherence    Patient reported X missed doses in the last month: 0  Specialty Medication: DESCOVY 200-25 mg tablet  Patient is on additional specialty medications: Yes  Additional Specialty Medications: TIVICAY 50 mg TABLET  Patient Reported Additional Medication X Missed Doses in the Last Month: 0              Were doses missed due to medication being on hold? No    Descovy 200-25 mg: 5 days of medicine on hand   Tivicay 50 mg: 5 days of medicine on hand        REFERRAL TO PHARMACIST     Referral to the pharmacist: Not needed      [facility]     Shipping address confirmed in Epic.     Delivery Scheduled: Yes, Expected medication delivery date: 09/02/21.     Medication will be delivered via Next Day Courier to the prescription address in Epic [facility].    Samuel [name]   [facility] Specialty Technician

## 2021-09-01 MED FILL — TIVICAY 50 MG TABLET: ORAL | 30 days supply | Qty: 30 | Fill #6

## 2021-09-01 MED FILL — DESCOVY 200 MG-25 MG TABLET: ORAL | 30 days supply | Qty: 30 | Fill #6

## 2021-09-10 NOTE — Unmapped (Signed)
Telephone call to patient regarding cancelled appointment on 4/11. The patient said he has some other appointments and will call back to reschedule.

## 2021-09-15 NOTE — Unmapped (Signed)
HISTORY OF PRESENT ILLNESS:      [name] is a 71 year old man with stage G4:A2 chronic kidney disease who is evaluated today for stage G4:A2 chronic kidney disease.  He is coping fairly well and is managing the grief associate with the loss of his sister and his brothers stroke.  He has changed his diet and now is consuming smoothies that consisted of cabbage, carrots, avocado, carrots, and squash.  He has not had increased leg edema.  He continues to have difficulty with walking secondary to diabetic neuropathy.  He has associated dysesthesias and has only had a partial response to gabapentin.  He has not had chest pain, PND, orthopnea.  His blood pressure has been well controlled.  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  He indicates that his glucose has been well controlled.  He has not had symptoms of hyper or hypoglycemia.  He has not had a recent flare of gout.  He notes slight increase in dyspnea on exertion since his COVID infection last year.    MEDICATIONS:    Current Outpatient Medications   Medication Sig Dispense Refill    abacavir (ZIAGEN) 300 mg tablet Take 2 tablets (600 mg total) by mouth daily. 60 tablet 11    albuterol (PROVENTIL HFA;VENTOLIN HFA) 90 mcg/actuation inhaler Inhale 2 puffs. Take as needed      allopurinoL (ZYLOPRIM) 100 MG tablet Take 250 mg by mouth daily.       amLODIPine (NORVASC) 10 MG tablet Take 1 tablet (10 mg total) by mouth daily. 90 tablet 3    aspirin (ECOTRIN) 81 MG tablet daily.   0    atenoloL (TENORMIN) 100 MG tablet Take 1 tablet (100 mg total) by mouth daily. 90 tablet 3    atorvastatin (LIPITOR) 40 MG tablet       BACLOFEN, BULK, TOP Apply topically. Baclofen 2%, Diclofenac 5%, Gabapentin 6%, Tetracaine 3%    Take as needed      blood sugar diagnostic (ONETOUCH VERIO TEST STRIPS) Strp Use 2 (two) times daily E11.22      calcium carbonate-vitamin D2 500 mg(1,250mg ) -200 unit tablet Take 1 tablet by mouth Two (2) times a day.      carvediloL (COREG) 12.5 MG tablet 12.5 mg Two (2) times a day.       cholecalciferol, vitamin D3 25 mcg, 1,000 units,, 1,000 unit (25 mcg) tablet Take by mouth daily.      cloNIDine HCL (CATAPRES) 0.1 MG tablet Take 1 tablet (0.1 mg total) by mouth 3 (three) times a day. 270 tablet 3    diazepam (VALIUM) 5 MG tablet Take 5 mg by mouth nightly.       dicyclomine (BENTYL) 10 mg capsule Take 10 mg by mouth two (2) times a day as needed.       docusate sodium (COLACE) 100 MG capsule Take 1 capsule (100 mg total) by mouth every twelve (12) hours. (Patient taking differently: Take 100 mg by mouth every twelve (12) hours. Take as needed) 60 capsule 0    dolutegravir (TIVICAY) 50 mg TABLET Take 1 tablet (50 mg total) by mouth daily. 30 tablet 11    dorzolamide-timoloL (COSOPT) 22.3-6.8 mg/mL ophthalmic solution Administer 1 drop to both eyes Two (2) times a day.       emtricitabine-tenofovir alafen (DESCOVY) 200-25 mg tablet Take 1 tablet by mouth daily.  30 tablet 11    ferrous sulfate 325 (65 FE) MG tablet Take 325 mg by mouth daily with breakfast.       folic acid (FOLVITE) 1 MG tablet   0    gabapentin (NEURONTIN) 300 MG capsule 300 mg Two (2) times a day.       glimepiride (AMARYL) 4 MG tablet Take 1 tablet (4 mg total) by mouth daily. 90 tablet 3    HYDROcodone-acetaminophen (NORCO) 5-325 mg per tablet Directions are take one tablet 30 minutes before physical therapy, then take one tablet at bedtime as needed for pain      lisinopriL (PRINIVIL,ZESTRIL) 40 MG tablet Take 1 tablet (40 mg total) by mouth daily. 90 tablet 3    nitroglycerin (NITROSTAT) 0.4 MG SL tablet DISSOLVE ONE TABLET UNDER TONGUE AS NEEDED FOR CHEST PAIN EVERY 5 MINUTES AS DIRECTED 25 tablet 0    nystatin (MYCOSTATIN) 100,000 unit/mL suspension Take 5 mL by mouth daily.      ondansetron (ZOFRAN) 4 MG tablet take 1 tablet by mouth every 8 hours if needed for nausea      oxyCODONE-acetaminophen (PERCOCET) 5-325 mg per tablet Take 1 tablet by mouth in the morning.      ROCKLATAN 0.02-0.005 % Drop INT 1 GTT INTO OU QHS      sertraline (ZOLOFT) 50 MG tablet take 1 tablet by mouth once daily      SITagliptin (JANUVIA) 50 MG tablet Take 1 tablet (50 mg total) by mouth daily. 90 tablet 3    spironolactone (ALDACTONE) 25 MG tablet Take 1 tablet (25 mg total) by mouth daily. 90 tablet 3    tamsulosin (FLOMAX) 0.4 mg capsule TAKE 1 CAPSULE(0.4 MG) BY MOUTH DAILY 90 capsule 3    torsemide (DEMADEX) 20 MG tablet Take 2 tablets (40 mg total) by mouth daily. 90 tablet 3     No current facility-administered medications for this visit.         REVIEW OF SYSTEMS:  Constitutional: No fevers or chills. Cardiovascular: No chest pain.  Pulmonary:  All other systems are reviewed and are negative except that noted in the history of present illness.    PHYSICAL EXAMINATION:      Constitutional: He is in no acute distress.  Vital signs: BP 133/79 (BP Site: L Arm, BP Position: Sitting, BP Cuff Size: Large)  - Pulse 60  - Temp 36.4 ??C (97.6 ??F) (Temporal)  - Ht 179.8 cm (5' 10.79)  - Wt 97.8 kg (215 lb 9.6 oz)  - BMI 30.25 kg/m??   Wt Readings from Last 3 Encounters:   09/23/21 97.8 kg (215 lb 9.6 oz)   06/24/21 96.2 kg (212 lb)   06/06/21 98 kg (216 lb)   HEENT: Masked.    Neck: Supple without lymphadenopathy or thyromegaly. No carotid bruits.   Lungs: Clear to auscultation   Heart: Regular rate and rhythm. Normal S1-S2.   Abdomen: Soft, nontender, normoactive bowel sounds.  Liver span is about 9 cm to percussion.  Neurologic: Alert and oriented x 3.  Strength is normal.  DTRs are 2+ and symmetric.  There are no lateralizing sensory deficits.  Extremities: No edema.  Dorsalis pedis and posterior tibial pulses are palpable.    Node survey: No lymphadenopathy.   Musculoskeletal: No active synovitis.   Skin/Nails:  No rash or nail changes.      LABORATORY STUDIES:    Recent Results (from the past 168 hour(s))   Hepatic Function Panel  Collection Time: 09/23/21  9:29 AM   Result Value Ref Range    Albumin 4.3 3.4 - 5.0 g/dL    Total Protein 7.8 5.7 - 8.2 g/dL    Total Bilirubin 0.6 0.3 - 1.2 mg/dL    Bilirubin, Direct 1.61 0.00 - 0.30 mg/dL    AST 52 (H) <=09 U/L    ALT 106 (H) 10 - 49 U/L    Alkaline Phosphatase 79 46 - 116 U/L   Hemoglobin A1c    Collection Time: 09/23/21  9:29 AM   Result Value Ref Range    Hemoglobin A1C 6.5 (H) 4.8 - 5.6 %    Estimated Average Glucose 140 mg/dL   CBC w/ Differential    Collection Time: 09/23/21  9:29 AM   Result Value Ref Range    WBC 8.0 3.6 - 11.2 10*9/L    RBC 4.13 (L) 4.26 - 5.60 10*12/L    HGB 13.5 12.9 - 16.5 g/dL    HCT 60.4 54.0 - 98.1 %    MCV 96.8 (H) 77.6 - 95.7 fL    MCH 32.7 (H) 25.9 - 32.4 pg    MCHC 33.8 32.0 - 36.0 g/dL    RDW 19.1 47.8 - 29.5 %    MPV 8.5 6.8 - 10.7 fL    Platelet 128 (L) 150 - 450 10*9/L    Neutrophils % 65.3 %    Lymphocytes % 23.1 %    Monocytes % 9.7 %    Eosinophils % 1.3 %    Basophils % 0.6 %    Absolute Neutrophils 5.2 1.8 - 7.8 10*9/L    Absolute Lymphocytes 1.8 1.1 - 3.6 10*9/L    Absolute Monocytes 0.8 0.3 - 0.8 10*9/L    Absolute Eosinophils 0.1 0.0 - 0.5 10*9/L    Absolute Basophils 0.0 0.0 - 0.1 10*9/L   LYMPH MARKER LIMITED,FLOW    Collection Time: 09/23/21  9:29 AM   Result Value Ref Range    CD3% (T Cells) 73 61 - 86 %    Absolute CD3 Count 1,314 915 - 3,400 /uL    CD4% (T Helper) 34 34 - 58 %    Absolute CD4 Count 612 510 - 2,320 /uL    CD8% T Suppressor 38 12 - 38 %    Absolute CD8 Count 684 180 - 1,520 /uL    CD4:CD8 Ratio 0.9 0.9 - 4.8   Basic Metabolic Panel    Collection Time: 09/23/21  9:29 AM   Result Value Ref Range    Sodium 139 135 - 145 mmol/L    Potassium 5.2 (H) 3.4 - 4.8 mmol/L    Chloride 104 98 - 107 mmol/L    CO2 26.0 20.0 - 31.0 mmol/L    Anion Gap 9 5 - 14 mmol/L    BUN 30 (H) 9 - 23 mg/dL    Creatinine 6.21 (H) 0.60 - 1.10 mg/dL    BUN/Creatinine Ratio 9     eGFR CKD-EPI (2021) Male 20 (L) >=60 mL/min/1.78m2    Glucose 161 70 - 179 mg/dL    Calcium 30.8 8.7 - 65.7 mg/dL   Phosphorus Level    Collection Time: 09/23/21  9:29 AM   Result Value Ref Range    Phosphorus 5.1 2.4 - 5.1 mg/dL   Albumin/creatinine urine ratio    Collection Time: 09/23/21  9:52 AM   Result Value Ref Range    Creat U 41.2 Undefined mg/dL    Albumin Quantitative, Urine 0.4 Undefined mg/dL  Albumin/Creatinine Ratio 9.7 0.0 - 30.0 ug/mg     Serum creatinine trend:    Creatinine   Date Value Ref Range Status   09/23/2021 3.26 (H) 0.60 - 1.10 mg/dL Final   16/03/9603 5.40 (H) 0.60 - 1.10 mg/dL Final   98/04/9146 8.29 (H) 0.60 - 1.10 mg/dL Final   56/21/3086 5.78 (H) 0.60 - 1.10 mg/dL Final   46/96/2952 8.41 (H) 0.76 - 1.27 mg/dL Final   32/44/0102 7.25 (H) 0.76 - 1.27 mg/dL Final   36/64/4034 7.42 (H) 0.60 - 1.10 mg/dL Final   59/56/3875 6.43 (H) 0.60 - 1.10 mg/dL Final   32/95/1884 1.66 (H) 0.76 - 1.27 mg/dL Final   11/29/1599 0.93 (H) 0.76 - 1.27 mg/dL Final   23/55/7322 0.25 (H) 0.60 - 1.10 mg/dL Final   42/70/6237 6.28 (H) 0.76 - 1.27 mg/dL Final   31/51/7616 0.73 (H) 0.76 - 1.27 mg/dL Final   71/10/2692 8.54 (H) 0.76 - 1.27 mg/dL Final     Imaging:    Chest CT 06/24/2021    Chest CT shows stability of the right pulmonary nodule.  However, it showed increased bilateral lower lobe groundglass and reticular abnormality may represent changes of infectious or organizing pneumonia (including autoimmune or medication-related etiologies).      IMPRESSION AND PLAN:    1.  Stage G4:A2 chronic kidney disease.  He continues with slow progression of chronic kidney disease.  He does not have indications to initiate dialysis or proceed with vascular access at this time.  He is to complete a colonoscopy as the final phase of his transplant evaluation.     2.  Hypertension.  His blood pressure is in the target range.  He will continue amlodipine 10 mg daily, lisinopril 40 mg once daily, hydralazine 100 mg twice daily, atenolol 100 mg once daily, doxazosin 4 mg daily, torsemide 40 mg once daily, spironolactone 25 mg once daily.    3.  Human immunodeficiency virus disease.  He has ongoing follow-up with Dr. Tomasa Hose.  He will continue abacavir, dolutegravir, and lamivudine.      4.  Lower urinary tract symptoms.  Continue tamsulosin.    5.  Secondary/tertiary hyperparathyroidism.  His serum calcium level is acceptable.    6.  Type 2 diabetes mellitus.  He will see Dr. Marcello Fennel in follow-up.    7.  4 mm RLL pulmonary nodule and groundglass infiltrates on chest x-ray.  I referred him to pulmonary medicine.  He is going to see Dr. Maple Mirza in consultation.    9.  Nonalcoholic steatohepatitis.  Recommended ongoing lifestyle modification.    10. Chronic low back pain.  He has follow-up with a spine specialist in [place].    11. Follow up plans.  He will return to see me in about 3 months.        Zetta Bills. Stefano Gaul, MD  Date of service 09/23/2021

## 2021-09-23 ENCOUNTER — Ambulatory Visit: Admit: 2021-09-23 | Discharge: 2021-09-24 | Payer: MEDICARE | Attending: Nephrology | Primary: Nephrology

## 2021-09-23 DIAGNOSIS — B20 Human immunodeficiency virus [HIV] disease: Principal | ICD-10-CM

## 2021-09-23 DIAGNOSIS — I151 Hypertension secondary to other renal disorders: Principal | ICD-10-CM

## 2021-09-23 DIAGNOSIS — I214 Non-ST elevation (NSTEMI) myocardial infarction: Principal | ICD-10-CM

## 2021-09-23 DIAGNOSIS — M1000 Idiopathic gout, unspecified site: Principal | ICD-10-CM

## 2021-09-23 DIAGNOSIS — N2889 Other specified disorders of kidney and ureter: Principal | ICD-10-CM

## 2021-09-23 DIAGNOSIS — E1122 Type 2 diabetes mellitus with diabetic chronic kidney disease: Principal | ICD-10-CM

## 2021-09-23 DIAGNOSIS — K7469 Other cirrhosis of liver: Principal | ICD-10-CM

## 2021-09-23 DIAGNOSIS — N184 Chronic kidney disease, stage 4 (severe): Principal | ICD-10-CM

## 2021-09-23 DIAGNOSIS — I2510 Atherosclerotic heart disease of native coronary artery without angina pectoris: Principal | ICD-10-CM

## 2021-09-23 LAB — CBC W/ AUTO DIFF
BASOPHILS ABSOLUTE COUNT: 0 10*9/L (ref 0.0–0.1)
BASOPHILS RELATIVE PERCENT: 0.6 %
EOSINOPHILS ABSOLUTE COUNT: 0.1 10*9/L (ref 0.0–0.5)
EOSINOPHILS RELATIVE PERCENT: 1.3 %
HEMATOCRIT: 40 % (ref 39.0–48.0)
HEMOGLOBIN: 13.5 g/dL (ref 12.9–16.5)
LYMPHOCYTES ABSOLUTE COUNT: 1.8 10*9/L (ref 1.1–3.6)
LYMPHOCYTES RELATIVE PERCENT: 23.1 %
MEAN CORPUSCULAR HEMOGLOBIN CONC: 33.8 g/dL (ref 32.0–36.0)
MEAN CORPUSCULAR HEMOGLOBIN: 32.7 pg — ABNORMAL HIGH (ref 25.9–32.4)
MEAN CORPUSCULAR VOLUME: 96.8 fL — ABNORMAL HIGH (ref 77.6–95.7)
MEAN PLATELET VOLUME: 8.5 fL (ref 6.8–10.7)
MONOCYTES ABSOLUTE COUNT: 0.8 10*9/L (ref 0.3–0.8)
MONOCYTES RELATIVE PERCENT: 9.7 %
NEUTROPHILS ABSOLUTE COUNT: 5.2 10*9/L (ref 1.8–7.8)
NEUTROPHILS RELATIVE PERCENT: 65.3 %
PLATELET COUNT: 128 10*9/L — ABNORMAL LOW (ref 150–450)
RED BLOOD CELL COUNT: 4.13 10*12/L — ABNORMAL LOW (ref 4.26–5.60)
RED CELL DISTRIBUTION WIDTH: 13 % (ref 12.2–15.2)
WBC ADJUSTED: 8 10*9/L (ref 3.6–11.2)

## 2021-09-23 LAB — BASIC METABOLIC PANEL
ANION GAP: 9 mmol/L (ref 5–14)
BLOOD UREA NITROGEN: 30 mg/dL — ABNORMAL HIGH (ref 9–23)
BUN / CREAT RATIO: 9
CALCIUM: 10 mg/dL (ref 8.7–10.4)
CHLORIDE: 104 mmol/L (ref 98–107)
CO2: 26 mmol/L (ref 20.0–31.0)
CREATININE: 3.26 mg/dL — ABNORMAL HIGH
EGFR CKD-EPI (2021) MALE: 20 mL/min/{1.73_m2} — ABNORMAL LOW (ref >=60)
GLUCOSE RANDOM: 161 mg/dL (ref 70–179)
POTASSIUM: 5.2 mmol/L — ABNORMAL HIGH (ref 3.4–4.8)
SODIUM: 139 mmol/L (ref 135–145)

## 2021-09-23 LAB — LYMPH MARKER LIMITED,FLOW
ABSOLUTE CD3 CNT: 1314 {cells}/uL (ref 915–3400)
ABSOLUTE CD4 CNT: 612 {cells}/uL (ref 510–2320)
ABSOLUTE CD8 CNT: 684 {cells}/uL (ref 180–1520)
CD3% (T CELLS): 73 % (ref 61–86)
CD4% (T HELPER): 34 % (ref 34–58)
CD4:CD8 RATIO: 0.9 (ref 0.9–4.8)
CD8% T SUPPRESR: 38 % (ref 12–38)

## 2021-09-23 LAB — HEPATIC FUNCTION PANEL
ALBUMIN: 4.3 g/dL (ref 3.4–5.0)
ALKALINE PHOSPHATASE: 79 U/L (ref 46–116)
ALT (SGPT): 106 U/L — ABNORMAL HIGH (ref 10–49)
AST (SGOT): 52 U/L — ABNORMAL HIGH (ref <=34)
BILIRUBIN DIRECT: 0.2 mg/dL (ref 0.00–0.30)
BILIRUBIN TOTAL: 0.6 mg/dL (ref 0.3–1.2)
PROTEIN TOTAL: 7.8 g/dL (ref 5.7–8.2)

## 2021-09-23 LAB — ALBUMIN / CREATININE URINE RATIO
ALBUMIN QUANT URINE: 0.4 mg/dL
ALBUMIN/CREATININE RATIO: 9.7 ug/mg (ref 0.0–30.0)
CREATININE, URINE: 41.2 mg/dL

## 2021-09-23 LAB — PHOSPHORUS: PHOSPHORUS: 5.1 mg/dL (ref 2.4–5.1)

## 2021-09-23 LAB — HEMOGLOBIN A1C
ESTIMATED AVERAGE GLUCOSE: 140 mg/dL
HEMOGLOBIN A1C: 6.5 % — ABNORMAL HIGH (ref 4.8–5.6)

## 2021-09-23 NOTE — Unmapped (Signed)
AOBP:     left    arm       large        cuff     Average :     133/79           Pulse: 60    1st reading: 121/76            Pulse: 60    2nd reading: 146/86         Pulse: 64    3rd reading:   131/76          Pulse: 86

## 2021-09-23 NOTE — Unmapped (Addendum)
Congratulations on your honor for volunteerism and you healthy diet.     Call [phone] to complete colonoscopy  Return in 3-4 months

## 2021-09-24 LAB — HIV RNA, QUANTITATIVE, PCR
HIV RNA QNT RSLT: DETECTED — AB
HIV RNA: <20 {copies}/mL — ABNORMAL HIGH (ref <0)

## 2021-09-30 ENCOUNTER — Ambulatory Visit: Admit: 2021-09-30 | Discharge: 2021-10-01 | Payer: MEDICARE

## 2021-09-30 DIAGNOSIS — J8489 Other specified interstitial pulmonary diseases: Principal | ICD-10-CM

## 2021-09-30 DIAGNOSIS — J849 Interstitial pulmonary disease, unspecified: Principal | ICD-10-CM

## 2021-09-30 NOTE — Unmapped (Signed)
[facility] Refill Coordination Note    Specialty Medication(s) to be Shipped:   Infectious Disease: Descovy and Tivicay    Other medication(s) to be shipped: No additional medications requested for fill at this time     Samuel [name], DOB: June 01, 1951  Phone: There are no phone numbers on file.      All above HIPAA information was verified with patient.     Was a Nurse, learning disability used for this call? No    Completed refill call assessment today to schedule patient's medication shipment from the [facility] [phone]).  All relevant notes have been reviewed.     Specialty medication(s) and dose(s) confirmed: Regimen is correct and unchanged.   Changes to medications: [place] reports no changes at this time.  Changes to insurance: No  New side effects reported not previously addressed with a pharmacist or physician: None reported  Questions for the pharmacist: No    Confirmed patient received a Conservation officer, historic buildings and a Surveyor, mining with first shipment. The patient will receive a drug information handout for each medication shipped and additional FDA Medication Guides as required.       DISEASE/MEDICATION-SPECIFIC INFORMATION        N/A    SPECIALTY MEDICATION ADHERENCE     Medication Adherence    Patient reported X missed doses in the last month: 0  Specialty Medication: tivicay  Patient is on additional specialty medications: Yes  Additional Specialty Medications: descovy  Patient Reported Additional Medication X Missed Doses in the Last Month: 0  Patient is on more than two specialty medications: No  Informant: patient              Were doses missed due to medication being on hold? No    Descovy 200-25 mg: 6 days of medicine on hand   Tivicay 50 mg: 6 days of medicine on hand       REFERRAL TO PHARMACIST     Referral to the pharmacist: Not needed      [facility]     Shipping address confirmed in Epic.     Delivery Scheduled: Yes, Expected medication delivery date: 10/03/21.     Medication will be delivered via Next Day Courier to the prescription address in Epic WAM.    Jasper Loser   [facility] Specialty Technician

## 2021-09-30 NOTE — Unmapped (Signed)
Thank you for allowing [place] to participate in your care. Please do not hesitate to contact us with issues or questions.     Will get lung function test and repeat a chest CT to review the lung tissue (not the nodule)   The nodule is stable with no further follow up required    [facility] Interventional Pulmonology  Tiana Loft, M.D.  Catalina Gravel D.O.  Roby Lofts, M.D.    Fellow: Hulen Luster, M.D.    During business hours please contact Tammy Allred at [phone] or e-mail her at [email or web address] for medical questions or Darrel Reach at [phone] for procedure scheduling questions or schedule changes.  The nursing triage line is [phone].    Sign up for MyChart to see your results, appointments and communicate directly with your doctors at [email or web address]    For urgent issues after hours please call [phone] [facility] Operator) and ask to speak with the Interventional Pulmonary Fellow on call.

## 2021-09-30 NOTE — Unmapped (Signed)
INTERVENTIONAL PULMONARY CLINIC NEW PATIENT EVALUATION    Assessment:     Patient:Samuel [name] (07/16/1950)  Reason for visit: Samuel [name] is a 71 y.o.male who is being seen in consultation at the request of Samuel [name] for right lower lobe lung nodule  - 4 mm RLL subpleural nodule seen in chest CT in 08/2021, and was seen in CT abdomen in 03/2020 with a PET scan at that time without any FDG avidity  - Smoker of 0.5 pack per day for close to 30 years  - No personal history of malignancy, no fevers or chills, denies aspiration   - History of CKD, plans for transplant     Plan:   - The lesion appears stable and has been for 2 years, in a patient without risk factors for cancer  - What is more concerning is the interstitial fibrotic pattern, but is more pronounced in the gravity dependent portions of the lung which may represent atelectatic lungs, therefore will get an HRCT with prone/supine and inspiratory/expiratory images; HIV and fibrotic lung disease is a common occurrence  - Will get lung function test to see if there's any restrictive patterns or decreased diffusion capacity  - Will refer patient to pulmonary clinic for HIV and ILD continued follow up    Patient seen and examined with Samuel [name]    Samuel Luster, MD  Interventional Pulmonology Fellow  Division of Pulmonary Diseases and Critical Care Medicine  Pager: [phone]    History:     Referring Physician:   Sharlett Iles, MD  [address]   [place] [address]  [place],  [place] [address]    Reason for Visit: Lung nodule    HPI:   Samuel [name] is a pleasant 71 year old gentleman with a history of chronic kidney, coronary artery disease, HIV with CD4 count at 612 on dolutegravir, lamiduvine, and abacavir, previous smoker quit in 1983, presented to the IP clinic because of a right lower lobe lung nodule. This was measured to be 4 mm in 2021, and has not changed since then. The reason he was referred to [place] is to make sure there is not any concerns for malignancy in light of further work up for possible renal transplant.  Patient denies aspirating to food or liquid materials, denies fevers or chills, no unintentional weight loss. He denies family history of cancer or personal history of cancer.   He states that his dies has been mainly consistent of juicing with basically fruits and vegetables. He denies aspirating any of the oral contents.  Review of his chest CT shows that the lesion that brought him to see [place] has been present in the past, about 2 years ago, and has not changed since then.     Past Medical History:   Diagnosis Date   ??? CKD (chronic kidney disease) stage 3, GFR 30-59 ml/min (CMS-HCC) 01/08/2015   ??? Coronary artery disease 10/2015    Multivessel disease, plan medical management after cath 6/17   ??? GERD (gastroesophageal reflux disease)    ??? Gout    ??? HIV (human immunodeficiency virus infection) (CMS-HCC)     Undectable viral load 5/17   ??? Hypertension    ??? Nephrolithiasis    ??? Nephrotic syndrome 03/05/2014   ??? Renal mass     Followed by neprhology, MRI 8/16 improved, felt to be benign       Past Surgical History:   Procedure Laterality Date   ??? BACK SURGERY  2012   ??? PR CATH PLACE/CORON ANGIO, IMG SUPER/INTERP,W LEFT HEART VENTRICULOGRAPHY N/A 11/13/2015    Procedure: Left Heart Catheterization;  Surgeon: Orpha Bur, MD;  Location: [facility] CATH;  Service: Cardiology   ??? PR REMOVAL OF ANAL FISSURE N/A 06/29/2014    Procedure: FISSURECTOMY, INCLUDING SPHINCTEROTOMY, WHEN PERFORMED;  Surgeon: Romero Belling, MD;  Location: [facility];  Service: Gastrointestinal   ??? PR SURG DIAGNOSTIC EXAM, ANORECTAL N/A 06/29/2014    Procedure: ANORECTAL EXAM, SURGICAL, REQUIRING ANESTHESIA (GENERAL, SPINAL, OR EPIDURAL), DIAGNOSTIC;  Surgeon: Romero Belling, MD;  Location: ASC [facility];  Service: Gastrointestinal       Current Outpatient Medications   Medication Sig Dispense Refill   ??? allopurinoL (ZYLOPRIM) 100 MG tablet Take 2.5 tablets (250 mg total) by mouth daily.     ??? aspirin (ECOTRIN) 81 MG tablet daily.   0   ??? atorvastatin (LIPITOR) 40 MG tablet Take 1 tablet (40 mg total) by mouth daily.     ??? blood sugar diagnostic (ONETOUCH VERIO TEST STRIPS) Strp Use 2 (two) times daily E11.22     ??? calcium carbonate-vitamin D2 500 mg(1,250mg ) -200 unit tablet Take 1 tablet (500 mg total) by mouth Two (2) times a day.     ??? carvediloL (COREG) 12.5 MG tablet 1 tablet (12.5 mg total) Two (2) times a day.     ??? cholecalciferol, vitamin D3 25 mcg, 1,000 units,, 1,000 unit (25 mcg) tablet Take by mouth daily.     ??? diazepam (VALIUM) 5 MG tablet Take 1 tablet (5 mg total) by mouth nightly.     ??? dicyclomine (BENTYL) 10 mg capsule Take 1 capsule (10 mg total) by mouth two (2) times a day as needed.     ??? docusate sodium (COLACE) 100 MG capsule Take 1 capsule (100 mg total) by mouth every twelve (12) hours. (Patient taking differently: Take 1 capsule (100 mg total) by mouth every twelve (12) hours. Take as needed) 60 capsule 0   ??? dolutegravir (TIVICAY) 50 mg TABLET Take 1 tablet (50 mg total) by mouth daily. 30 tablet 11   ??? dorzolamide-timoloL (COSOPT) 22.3-6.8 mg/mL ophthalmic solution Administer 1 drop to both eyes Two (2) times a day.     ??? emtricitabine-tenofovir alafen (DESCOVY) 200-25 mg tablet Take 1 tablet by mouth daily. 30 tablet 11   ??? ferrous sulfate 325 (65 FE) MG tablet Take 1 tablet (325 mg total) by mouth in the morning.     ??? folic acid (FOLVITE) 1 MG tablet   0   ??? gabapentin (NEURONTIN) 300 MG capsule 1 capsule (300 mg total) Two (2) times a day.     ??? HYDROcodone-acetaminophen (NORCO) 5-325 mg per tablet Directions are take one tablet 30 minutes before physical therapy, then take one tablet at bedtime as needed for pain     ??? nitroglycerin (NITROSTAT) 0.4 MG SL tablet DISSOLVE ONE TABLET UNDER TONGUE AS NEEDED FOR CHEST PAIN EVERY 5 MINUTES AS DIRECTED 25 tablet 0   ??? nystatin (MYCOSTATIN) 100,000 unit/mL suspension Take 5 mL (500,000 Units total) by mouth daily.     ??? ondansetron (ZOFRAN) 4 MG tablet take 1 tablet by mouth every 8 hours if needed for nausea     ??? oxyCODONE-acetaminophen (PERCOCET) 5-325 mg per tablet Take 1 tablet by mouth in the morning.     ??? ROCKLATAN 0.02-0.005 % Drop INT 1 GTT INTO OU QHS     ??? sertraline (ZOLOFT) 50 MG tablet take 1 tablet by  mouth once daily     ??? spironolactone (ALDACTONE) 25 MG tablet Take 1 tablet (25 mg total) by mouth daily. 90 tablet 3   ??? tamsulosin (FLOMAX) 0.4 mg capsule TAKE 1 CAPSULE(0.4 MG) BY MOUTH DAILY 90 capsule 3   ??? timolol (TIMOPTIC) 0.5 % ophthalmic solution INSTILL 1 DROP IN BOTH EYES EVERY MORNING     ??? torsemide (DEMADEX) 20 MG tablet Take 2 tablets (40 mg total) by mouth daily. 90 tablet 3   ??? abacavir (ZIAGEN) 300 mg tablet Take 2 tablets (600 mg total) by mouth daily. (Patient not taking: Reported on 09/23/2021) 60 tablet 11   ??? albuterol (PROVENTIL HFA;VENTOLIN HFA) 90 mcg/actuation inhaler Inhale 2 puffs. Take as needed (Patient not taking: Reported on 09/23/2021)     ??? amLODIPine (NORVASC) 10 MG tablet Take 1 tablet (10 mg total) by mouth daily. 90 tablet 3   ??? atenoloL (TENORMIN) 100 MG tablet Take 1 tablet (100 mg total) by mouth daily. 90 tablet 3   ??? BACLOFEN, BULK, TOP Apply topically. Baclofen 2%, Diclofenac 5%, Gabapentin 6%, Tetracaine 3%    Take as needed (Patient not taking: Reported on 09/23/2021)     ??? cloNIDine HCL (CATAPRES) 0.1 MG tablet Take 1 tablet (0.1 mg total) by mouth 3 (three) times a day. 270 tablet 3   ??? glimepiride (AMARYL) 4 MG tablet Take 1 tablet (4 mg total) by mouth daily. 90 tablet 3   ??? lisinopriL (PRINIVIL,ZESTRIL) 40 MG tablet Take 1 tablet (40 mg total) by mouth daily. 90 tablet 3   ??? SITagliptin (JANUVIA) 50 MG tablet Take 1 tablet (50 mg total) by mouth daily. 90 tablet 3     No current facility-administered medications for this visit.       Allergies as of 09/30/2021 - Reviewed 09/30/2021   Allergen Reaction Noted   ??? Colchicine analogues Diarrhea 05/28/2014   ??? Tramadol Other (See Comments) 05/24/2014       Family History   Problem Relation Age of Onset   ??? Hypertension Mother    ??? Cancer Mother    ??? Thyroid disease Mother    ??? Cancer Father    ??? Kidney disease Father    ??? Heart disease Sister 3   ??? Diabetes Brother    ??? Hypertension Sister    ??? Kidney failure Sister 22        On dialysis       Social History     Socioeconomic History   ??? Marital status: Married     Spouse name: Johnny Bridge   ??? Number of children: 2   ??? Highest education level: Some college, no degree   Tobacco Use   ??? Smoking status: Former     Packs/day: 1.00     Years: 10.00     Pack years: 10.00     Types: Cigarettes     Quit date: 03/11/1992     Years since quitting: 29.5     Passive exposure: Never   ??? Smokeless tobacco: Never   Substance and Sexual Activity   ??? Alcohol use: No     Alcohol/week: 0.0 standard drinks   ??? Drug use: No     Comment: Prior heroin use     Review of Systems  A 12 point review of systems was negative except for pertinent items noted in the HPI.    Objective:     Physical Examination:   General appearance - alert, well appearing, and in no distress  Vitals:   Vitals:    09/30/21 0929   BP: 127/80   Pulse: 66   Resp: 16   Temp: 37.2 ??C (99 ??F)   SpO2: 100%     Eyes - Sclera anicteric, conjunctiva pink  Mouth - mucous membranes moist, pharynx normal without lesions  Neck - Trachea supple and midline, (-) JVD  Lymphatics - no palpable lymphadenopathy  Heart - normal rate, regular rhythm, normal S1, S2, no murmurs, rubs, clicks or gallops  Chest - clear to auscultation, no wheezes, rales or rhonchi, symmetric air entry  Abdomen - soft, nontender, nondistended, no masses or organomegaly  Extremities - No pedal edema, no clubbing or cyanosis  Skin - normal coloration and turgor, no rashes, no suspicious skin lesions noted  Neurological - alert, oriented, normal speech, no focal findings or movement disorder noted    Labs and Imaging:   All pertinent labs and images were reviewed

## 2021-10-02 MED FILL — DESCOVY 200 MG-25 MG TABLET: ORAL | 30 days supply | Qty: 30 | Fill #7

## 2021-10-02 MED FILL — TIVICAY 50 MG TABLET: ORAL | 30 days supply | Qty: 30 | Fill #7

## 2021-10-14 ENCOUNTER — Ambulatory Visit: Admit: 2021-10-14 | Discharge: 2021-10-15 | Payer: MEDICARE

## 2021-10-14 DIAGNOSIS — J8489 Other specified interstitial pulmonary diseases: Principal | ICD-10-CM

## 2021-10-14 DIAGNOSIS — J849 Interstitial pulmonary disease, unspecified: Principal | ICD-10-CM

## 2021-10-24 NOTE — Unmapped (Signed)
[facility] Refill Coordination Note    Specialty Medication(s) to be Shipped:   Infectious Disease: Descovy and Tivicay    Other medication(s) to be shipped: No additional medications requested for fill at this time     Samuel [name], DOB: [birth year]  Phone: There are no phone numbers on file.      All above HIPAA information was verified with patient.     Was a Nurse, learning disability used for this call? No    Completed refill call assessment today to schedule patient's medication shipment from the [facility] [phone]).  All relevant notes have been reviewed.     Specialty medication(s) and dose(s) confirmed: Regimen is correct and unchanged.   Changes to medications: [name] reports no changes at this time.  Changes to insurance: No  New side effects reported not previously addressed with a pharmacist or physician: None reported  Questions for the pharmacist: No    Confirmed patient received a Conservation officer, historic buildings and a Surveyor, mining with first shipment. The patient will receive a drug information handout for each medication shipped and additional FDA Medication Guides as required.       DISEASE/MEDICATION-SPECIFIC INFORMATION        N/A    SPECIALTY MEDICATION ADHERENCE     Medication Adherence    Patient reported X missed doses in the last month: 0  Specialty Medication: descovy 200-25mg   Patient is on additional specialty medications: Yes  Additional Specialty Medications: Tivicay 50mg   Patient Reported Additional Medication X Missed Doses in the Last Month: 0  Patient is on more than two specialty medications: No  Informant: patient  Reliability of informant: reliable  Provider-estimated medication adherence level: good  Patient is at risk for Non-Adherence: No  Reasons for non-adherence: no problems identified  Confirmed plan for next specialty medication refill: delivery by pharmacy  Refills needed for supportive medications: not needed          Refill Coordination Has the Patients' Contact Information Changed: No  Is the Shipping Address Different: No         Were doses missed due to medication being on hold? No    tivicay 50 mg: 8 days of medicine on hand   descovy 20-25 mg: 8 days of medicine on hand         REFERRAL TO PHARMACIST     Referral to the pharmacist: Not needed      [facility]     Shipping address confirmed in Epic.     Delivery Scheduled: Yes, Expected medication delivery date: 05/31.     Medication will be delivered via Next Day Courier to the prescription address in Epic WAM.    Antonietta Barcelona   [facility] Specialty Technician

## 2021-10-27 MED FILL — DESCOVY 200 MG-25 MG TABLET: ORAL | 30 days supply | Qty: 30 | Fill #8

## 2021-10-27 MED FILL — TIVICAY 50 MG TABLET: ORAL | 30 days supply | Qty: 30 | Fill #8

## 2021-11-20 NOTE — Unmapped (Signed)
[facility] Refill Coordination Note    Specialty Medication(s) to be Shipped:   Infectious Disease: Descovy and Tivicay    Other medication(s) to be shipped: No additional medications requested for fill at this time     Samuel [name], DOB: March 02, 1951  Phone: There are no phone numbers on file.      All above HIPAA information was verified with patient.     Was a Nurse, learning disability used for this call? No    Completed refill call assessment today to schedule patient's medication shipment from the [facility] [phone]).  All relevant notes have been reviewed.     Specialty medication(s) and dose(s) confirmed: Regimen is correct and unchanged.   Changes to medications: Samuel [name] reports no changes at this time.  Changes to insurance: No  New side effects reported not previously addressed with a pharmacist or physician: None reported  Questions for the pharmacist: No    Confirmed patient received a Conservation officer, historic buildings and a Surveyor, mining with first shipment. The patient will receive a drug information handout for each medication shipped and additional FDA Medication Guides as required.       DISEASE/MEDICATION-SPECIFIC INFORMATION        N/A    SPECIALTY MEDICATION ADHERENCE     Medication Adherence    Patient reported X missed doses in the last month: 0  Specialty Medication: tivicay  Patient is on additional specialty medications: Yes  Additional Specialty Medications: descovy  Patient Reported Additional Medication X Missed Doses in the Last Month: 0  Patient is on more than two specialty medications: No              Were doses missed due to medication being on hold? No    Descovy 200-25 mg: 7 days of medicine on hand   Tivicay 50 mg: 7 days of medicine on hand        REFERRAL TO PHARMACIST     Referral to the pharmacist: Not needed      [facility]     Shipping address confirmed in Epic.     Delivery Scheduled: Yes, Expected medication delivery date: 11/25/21.     Medication will be delivered via Next Day Courier to the prescription address in Epic WAM.    Willette Pa   [facility] Specialty Technician

## 2021-11-24 MED FILL — TIVICAY 50 MG TABLET: ORAL | 30 days supply | Qty: 30 | Fill #9

## 2021-11-24 MED FILL — DESCOVY 200 MG-25 MG TABLET: ORAL | 30 days supply | Qty: 30 | Fill #9

## 2021-12-25 NOTE — Unmapped (Signed)
Addended byJackelyn Poling on: 12/25/2021 02:20 PM     Modules accepted: Orders

## 2021-12-26 NOTE — Unmapped (Signed)
[facility] Refill Coordination Note    Specialty Medication(s) to be Shipped:   Infectious Disease: Descovy and Tivicay    Other medication(s) to be shipped: No additional medications requested for fill at this time     Samuel [name], DOB: 1950-12-10  Phone: There are no phone numbers on file.      All above HIPAA information was verified with patient.     Was a Nurse, learning disability used for this call? No    Completed refill call assessment today to schedule patient's medication shipment from the [facility] [phone]).  All relevant notes have been reviewed.     Specialty medication(s) and dose(s) confirmed: Regimen is correct and unchanged.   Changes to medications: Samuel [name] reports no changes at this time.  Changes to insurance: No  New side effects reported not previously addressed with a pharmacist or physician: None reported  Questions for the pharmacist: No    Confirmed patient received a Conservation officer, historic buildings and a Surveyor, mining with first shipment. The patient will receive a drug information handout for each medication shipped and additional FDA Medication Guides as required.       DISEASE/MEDICATION-SPECIFIC INFORMATION        N/A    SPECIALTY MEDICATION ADHERENCE     Medication Adherence    Patient reported X missed doses in the last month: 0  Specialty Medication: tivicay  Patient is on additional specialty medications: Yes  Additional Specialty Medications: descovy  Patient Reported Additional Medication X Missed Doses in the Last Month: 0              Were doses missed due to medication being on hold? No    tivicay 50mg  and descovy 200-25mg   : unable to confirm quantity on hand    REFERRAL TO PHARMACIST     Referral to the pharmacist: Not needed      [name]     Shipping address confirmed in Epic.     Delivery Scheduled: Yes, Expected medication delivery date: 7/31.     Medication will be delivered via Same Day Courier to the prescription address in Epic WAM.    Samuel [name]   [facility] Specialty Technician

## 2021-12-29 MED FILL — DESCOVY 200 MG-25 MG TABLET: ORAL | 30 days supply | Qty: 30 | Fill #10

## 2021-12-29 MED FILL — TIVICAY 50 MG TABLET: ORAL | 30 days supply | Qty: 30 | Fill #10

## 2022-01-21 NOTE — Unmapped (Signed)
[facility] Clinical Assessment & Refill Coordination Note    Samuel [name], DOB: Aug 04, 1950  Phone: There are no phone numbers on file.    All above HIPAA information was verified with patient.     Was a Nurse, learning disability used for this call? No    Specialty Medication(s):   Infectious Disease: Descovy and Tivicay     Current Outpatient Medications   Medication Sig Dispense Refill    abacavir (ZIAGEN) 300 mg tablet Take 2 tablets (600 mg total) by mouth daily. (Patient not taking: Reported on 09/23/2021) 60 tablet 11    albuterol (PROVENTIL HFA;VENTOLIN HFA) 90 mcg/actuation inhaler Inhale 2 puffs. Take as needed (Patient not taking: Reported on 09/23/2021)      allopurinoL (ZYLOPRIM) 100 MG tablet Take 2.5 tablets (250 mg total) by mouth daily.      amLODIPine (NORVASC) 10 MG tablet Take 1 tablet (10 mg total) by mouth daily. 90 tablet 3    aspirin (ECOTRIN) 81 MG tablet daily.   0    atenoloL (TENORMIN) 100 MG tablet Take 1 tablet (100 mg total) by mouth daily. 90 tablet 3    atorvastatin (LIPITOR) 40 MG tablet Take 1 tablet (40 mg total) by mouth daily.      BACLOFEN, BULK, TOP Apply topically. Baclofen 2%, Diclofenac 5%, Gabapentin 6%, Tetracaine 3%    Take as needed (Patient not taking: Reported on 09/23/2021)      blood sugar diagnostic (ONETOUCH VERIO TEST STRIPS) Strp Use 2 (two) times daily E11.22      calcium carbonate-vitamin D2 500 mg(1,250mg ) -200 unit tablet Take 1 tablet (500 mg total) by mouth Two (2) times a day.      carvediloL (COREG) 12.5 MG tablet 1 tablet (12.5 mg total) Two (2) times a day.      cholecalciferol, vitamin D3 25 mcg, 1,000 units,, 1,000 unit (25 mcg) tablet Take by mouth daily.      cloNIDine HCL (CATAPRES) 0.1 MG tablet Take 1 tablet (0.1 mg total) by mouth 3 (three) times a day. 270 tablet 3    diazepam (VALIUM) 5 MG tablet Take 1 tablet (5 mg total) by mouth nightly.      dicyclomine (BENTYL) 10 mg capsule Take 1 capsule (10 mg total) by mouth two (2) times a day as needed.      docusate sodium (COLACE) 100 MG capsule Take 1 capsule (100 mg total) by mouth every twelve (12) hours. (Patient taking differently: Take 1 capsule (100 mg total) by mouth every twelve (12) hours. Take as needed) 60 capsule 0    dolutegravir (TIVICAY) 50 mg TABLET Take 1 tablet (50 mg total) by mouth daily. 30 tablet 11    dorzolamide-timoloL (COSOPT) 22.3-6.8 mg/mL ophthalmic solution Administer 1 drop to both eyes Two (2) times a day.      emtricitabine-tenofovir alafen (DESCOVY) 200-25 mg tablet Take 1 tablet by mouth daily. 30 tablet 11    ferrous sulfate 325 (65 FE) MG tablet Take 1 tablet (325 mg total) by mouth in the morning.      folic acid (FOLVITE) 1 MG tablet   0    gabapentin (NEURONTIN) 300 MG capsule 1 capsule (300 mg total) Two (2) times a day.      glimepiride (AMARYL) 4 MG tablet Take 1 tablet (4 mg total) by mouth daily. 90 tablet 3    HYDROcodone-acetaminophen (NORCO) 5-325 mg per tablet Directions are take one tablet 30 minutes before physical therapy, then take  one tablet at bedtime as needed for pain      lisinopriL (PRINIVIL,ZESTRIL) 40 MG tablet Take 1 tablet (40 mg total) by mouth daily. 90 tablet 3    nitroglycerin (NITROSTAT) 0.4 MG SL tablet DISSOLVE ONE TABLET UNDER TONGUE AS NEEDED FOR CHEST PAIN EVERY 5 MINUTES AS DIRECTED 25 tablet 0    nystatin (MYCOSTATIN) 100,000 unit/mL suspension Take 5 mL (500,000 Units total) by mouth daily.      ondansetron (ZOFRAN) 4 MG tablet take 1 tablet by mouth every 8 hours if needed for nausea      oxyCODONE-acetaminophen (PERCOCET) 5-325 mg per tablet Take 1 tablet by mouth in the morning.      ROCKLATAN 0.02-0.005 % Drop INT 1 GTT INTO OU QHS      sertraline (ZOLOFT) 50 MG tablet take 1 tablet by mouth once daily      SITagliptin (JANUVIA) 50 MG tablet Take 1 tablet (50 mg total) by mouth daily. 90 tablet 3    spironolactone (ALDACTONE) 25 MG tablet Take 1 tablet (25 mg total) by mouth daily. 90 tablet 3    tamsulosin (FLOMAX) 0.4 mg capsule TAKE 1 CAPSULE(0.4 MG) BY MOUTH DAILY 90 capsule 3    timolol (TIMOPTIC) 0.5 % ophthalmic solution INSTILL 1 DROP IN BOTH EYES EVERY MORNING      torsemide (DEMADEX) 20 MG tablet Take 2 tablets (40 mg total) by mouth daily. 90 tablet 3     No current facility-administered medications for this visit.        Changes to medications: Samuel [name] reports no changes at this time.    Allergies   Allergen Reactions    Colchicine Analogues Diarrhea     Have diarrhea when taken for long periods of time    Tramadol Other (See Comments)     unknown tolerates morphine       Changes to allergies: No    SPECIALTY MEDICATION ADHERENCE     Descovy 200-25 mg: approximately 7 days of medicine on hand   Tivicay 50 mg: approximately 7 days of medicine on hand     Medication Adherence    Patient reported X missed doses in the last month: 0  Specialty Medication: Descovy 200-25mg   Patient is on additional specialty medications: Yes  Additional Specialty Medications: Tivicay 50mg   Patient Reported Additional Medication X Missed Doses in the Last Month: 0  Patient is on more than two specialty medications: No  Any gaps in refill history greater than 2 weeks in the last 3 months: no  Demonstrates understanding of importance of adherence: yes  Informant: patient  Provider-estimated medication adherence level: good  Patient is at risk for Non-Adherence: No    Specialty medication(s) dose(s) confirmed: Regimen is correct and unchanged.     Are there any concerns with adherence? No    Adherence counseling provided? Not needed    CLINICAL MANAGEMENT AND INTERVENTION      Clinical Benefit Assessment:    Do you feel the medicine is effective or helping your condition? Yes    HIV ASSOCIATED LABS:     Lab Results   Component Value Date/Time    HIVRS Detected (A) 09/23/2021 09:29 AM    HIVRS Not Detected 02/26/2021 10:18 AM    HIVRS Not Detected 01/29/2021 10:39 AM    HIVRS Not Detected 08/15/2014 11:42 AM    HIVRS Not Detected 01/03/2014 10:46 AM    HIVRS Not Detected 11/23/2012 12:53 PM    HIVCP <20 (H) 09/23/2021 09:29 AM  HIVCP <40 01/02/2021 10:26 AM    HIVCP <40 (H) 09/04/2020 10:17 AM    HIVCP <40 05/09/2020 03:11 PM    HIVCP <40 (H) 12/26/2019 10:24 AM    HIVCP <40 05/16/2019 02:24 PM    ACD4 612 09/23/2021 09:29 AM    ACD4 720 02/26/2021 10:18 AM    ACD4 578 01/29/2021 10:39 AM    ACD4 514 08/01/2014 09:35 AM    ACD4 430 (L) 01/03/2014 10:46 AM    ACD4 549 11/23/2012 12:53 PM       Clinical Benefit counseling provided? Labs from 09/23/21 show evidence of clinical benefit    Adverse Effects Assessment:    Are you experiencing any side effects? No    Are you experiencing difficulty administering your medicine? No    Quality of Life Assessment:    How many days over the past month did your HIV  keep you from your normal activities? For example, brushing your teeth or getting up in the morning. 0    Have you discussed this with your provider? Not needed    Acute Infection Status:    Acute infections noted within Epic:  No active infections  Patient reported infection: None    Therapy Appropriateness:    Is therapy appropriate and patient progressing towards therapeutic goals? Yes, therapy is appropriate and should be continued    DISEASE/MEDICATION-SPECIFIC INFORMATION      N/A    PATIENT SPECIFIC NEEDS     Does the patient have any physical, cognitive, or cultural barriers? No    Is the patient high risk? No    Does the patient require a Care Management Plan? No     SOCIAL DETERMINANTS OF HEALTH     At the [facility], we have learned that life circumstances - like trouble affording food, housing, utilities, or transportation can affect the health of many of our patients.   That is why we wanted to ask: are you currently experiencing any life circumstances that are negatively impacting your health and/or quality of life? Patient declined to answer    Social Determinants of Health     Financial Resource Strain: Not on file   Internet Connectivity: Not on file   Food Insecurity: Not on file   Tobacco Use: Medium Risk (09/30/2021)    Patient History     Smoking Tobacco Use: Former     Smokeless Tobacco Use: Never     Passive Exposure: Never   Housing/Utilities: Not on file   Alcohol Use: Not on file   Transportation Needs: Not on file   Substance Use: Not on file   Health Literacy: Not on file   Physical Activity: Not on file   Interpersonal Safety: Not on file   Stress: Not on file   Intimate Partner Violence: Not on file   Depression: Not on file   Social Connections: Not on file       Would you be willing to receive help with any of the needs that you have identified today? Not applicable       SHIPPING     Specialty Medication(s) to be Shipped:   Infectious Disease: Descovy and Tivicay    Other medication(s) to be shipped: No additional medications requested for fill at this time     Changes to insurance: No    Delivery Scheduled: Yes, Expected medication delivery date: 01/26/22.     Medication will be delivered via Same Day Courier to the confirmed prescription address in Epic  [facility].    The patient will receive a drug information handout for each medication shipped and additional FDA Medication Guides as required.  Verified that patient has previously received a Conservation officer, historic buildings and a Surveyor, mining.    The patient or caregiver noted above participated in the development of this care plan and knows that they can request review of or adjustments to the care plan at any time.      All of the patient's questions and concerns have been addressed.    Roderic Palau   [facility] Specialty Pharmacist

## 2022-01-26 MED FILL — TIVICAY 50 MG TABLET: ORAL | 30 days supply | Qty: 30 | Fill #11

## 2022-01-26 MED FILL — DESCOVY 200 MG-25 MG TABLET: ORAL | 30 days supply | Qty: 30 | Fill #11

## 2022-02-19 DIAGNOSIS — B20 Human immunodeficiency virus [HIV] disease: Principal | ICD-10-CM

## 2022-02-19 MED ORDER — DESCOVY 200 MG-25 MG TABLET
ORAL_TABLET | Freq: Every day | ORAL | 11 refills | 30 days
Start: 2022-02-19 — End: ?

## 2022-02-19 MED ORDER — TIVICAY 50 MG TABLET
ORAL_TABLET | Freq: Every day | ORAL | 11 refills | 30 days
Start: 2022-02-19 — End: ?

## 2022-02-19 NOTE — Unmapped (Signed)
[facility] Refill Coordination Note    Specialty Medication(s) to be Shipped:   Infectious Disease: Descovy and Tivicay    Other medication(s) to be shipped: No additional medications requested for fill at this time     Samuel [name], DOB: 1951-05-26  Phone: There are no phone numbers on file.      All above HIPAA information was verified with patient.     Was a Nurse, learning disability used for this call? No    Completed refill call assessment today to schedule patient's medication shipment from the [facility] [phone]).  All relevant notes have been reviewed.     Specialty medication(s) and dose(s) confirmed: Regimen is correct and unchanged.   Changes to medications: Kasir reports no changes at this time.  Changes to insurance: No  New side effects reported not previously addressed with a pharmacist or physician: None reported  Questions for the pharmacist: No    Confirmed patient received a Conservation officer, historic buildings and a Surveyor, mining with first shipment. The patient will receive a drug information handout for each medication shipped and additional FDA Medication Guides as required.       DISEASE/MEDICATION-SPECIFIC INFORMATION        N/A    SPECIALTY MEDICATION ADHERENCE     Medication Adherence    Patient reported X missed doses in the last month: 0  Specialty Medication: tivicay 50mg   Patient is on additional specialty medications: Yes  Additional Specialty Medications: descovy 200-25mg   Patient Reported Additional Medication X Missed Doses in the Last Month: 0                          Were doses missed due to medication being on hold? No    tivicay 50mg   : 7 days of medicine on hand   descovy 200-25mg   : 7 days of medicine on hand       REFERRAL TO PHARMACIST     Referral to the pharmacist: Not needed      [facility]     Shipping address confirmed in Epic.     Delivery Scheduled: Yes, Expected medication delivery date: 9/26.  However, Rx request for refills was sent to the provider as there are none remaining.     Medication will be delivered via Next Day Courier to the prescription address in Epic WAM.    [name]   [facility] Specialty Technician

## 2022-02-20 MED ORDER — DESCOVY 200 MG-25 MG TABLET
ORAL_TABLET | Freq: Every day | ORAL | 11 refills | 30 days | Status: CP
Start: 2022-02-20 — End: ?
  Filled 2022-02-24: qty 30, 30d supply, fill #0

## 2022-02-20 MED ORDER — TIVICAY 50 MG TABLET
ORAL_TABLET | Freq: Every day | ORAL | 11 refills | 30 days | Status: CP
Start: 2022-02-20 — End: ?
  Filled 2022-02-24: qty 30, 30d supply, fill #0

## 2022-02-20 NOTE — Unmapped (Signed)
Medication Requested: Tivicay 50 mg tablet and Descovy 200-25 mg tablet.     Last visit 02/26/2021    Future Appointments   Date Time Provider Department Center   03/26/2022  9:00 AM Hladik, Bernita Buffy, MD An[facility] Lineman     Per Provider Note:   03/20/2021 I have switched him back to Descovy/dolutegravir. Dr Thedora Hinders will contact him with these instructions -- ie start Descovy 1 tab daily; stop abacavir/3TC; continue dolutegravir. He should see me in about a month.     Standing order protocol requirements met?: No    Sent to: Provider for signing

## 2022-03-11 NOTE — Unmapped (Incomplete)
Good to see you today!    Your most recent CD4 count was:  Date Value Status   09/23/2021 612 Final   08/01/2014 514 Final     As long as your CD4 count is over the 200-300 range, you're OK.      Your most recent viral load was:  HIV RNA Quant Result (no units)   Date Value Status   09/23/2021 Detected (A) Final     HIV RNA (copies/mL)   Date Value Status   09/23/2021 <20 (H) Final     Remember the goal of your treatment is to keep the amount of virus in your blood very, very low.  When you take your medications consistently, most of the time it will say Not Detected in the HIV RNA Quant Result box.  If it says Detected, then take a look at the next box that says HIV RNA and find the row that matches the date for the HIV RNA Quant Result box.  If the number is really small (less than 50), there's nothing to worry about. This means the test picked up a tiny amount of virus that doesn't mean anything bad.  If the number is bigger than 1,500, then this means that there was a substantial amount of virus in your blood AND that it was possible for you to transmit HIV to others.    If you have any questions about any of your test results, please send Dr. Ian Bushman a message in MyChart.

## 2022-03-11 NOTE — Unmapped (Signed)
[facility]  [address]  [place], [place]  [address]  P [phone]  F [phone]     Primary care provider: Barbette Reichmann, MD    Last ID visit: 18 Feb 2021 ([facility])      Assessment/Plan:      HIV (dx'd 12/1999, nadir CD4 239 / 30% in 06/2011)  - chronic, stable  Current regimen: {ID Clinic ARV Selection ZHYQ:65784}  Misses doses of ARVs {ID - Frequency:68239}    ARV hx  Atripla (first regimen; rx'd 09/2007) - ~2010 - 07/2015  Descovy + Tivicay (d/t CKD) - 07/2015 - 12/2020  ABC 600/d + 3TC 100/d + DTG 50/d  - 12/2020 - 01/2021  Descovy + Tivicay - 01/2021 - presednt      Med access {ID Clinic Rx Access:60826}  CD4 count {ID Clinic - CD4 Count & Prophylaxis:77705}  Discussed {ID Clinic Discussion Topics:60825}    Lab Results   Component Value Date    ACD4 612 09/23/2021    CD4TABS 860 01/02/2021    CD4 34 09/23/2021    CD4TCELL 37.4 01/02/2021    HIVRS Detected (A) 09/23/2021    HIVCP <20 (H) 09/23/2021     03/2022 - ***      HIV RNA and safety labs (brief return panel)  {ID Clinic - ARV ONGE:95284}  {ID Clinic - Adherence:61194} ARV adherence      CKD stage G4/A2, pre-transplant  - chronic, progressive     Lab Results   Component Value Date    CREATININE 3.26 (H) 09/23/2021    EGFR 20 (L) 09/23/2021     03/2022 - Initial transplant eval 07/2020. Most recent visit w/[facility] 08/2021. Per HPI, doing well in interim. BP 133/79. Slow progression. No indication for HD. He is to complete a colonoscopy as the final phase of his transplant evaluation. Plan = RTC 46m.  ***      Liver disease  - HCV, genotype 1a, dx'd 1990s and cured s/p ELB/GRZ x12 weeks in 2017  -  RESOLVED  - Chronic transaminitis and declining platelets c/f NASH (08/2018 - present)  - {idcmdm2021:73327}   03/2022 - Liver bx on 08Dec2006 showed grade 2 stage 2. EGD 09/2015 was w/o varices (normal plts @ time per Dr Harrel Lemon note from 04/06/2016). Because of his CKD, he was treated with 12 weeks of ELB/GRZ. HCV RNA at start of tx in 01/2016 was 328K, down to ND by 05/2016 and ND on rechecks in 07/2016, 09/2016, 11/2017, 12/2019, and 06/2021. Most recent GI f/u was in Jan 2023 [facility] NP) - per her note, Fibroscan in clinic was now F0-1 from [facility] pre-treatment. Overall assessment was for NASH. Plan = RTC 5-14m (summer 2023) - no f/u in future appts. Today, ***  Re-refer to [facility] for re-engagement now that Dr. Piedad Climes is no longer w/[place]. ***      Cardiac disease  - HFpEF (EF 60-65% on TTE 09/2019)  - {idcmdm2021:73327}   - CAD s/p NSTEMI 10/2015 (LHC w/ multivessel dx - no stent, med mgm't) - {idcmdm2021:73327}   - Aortic sclerosis  - {idcmdm2021:73327}   - Difficult to control HTN  - {idcmdm2021:73327}   03/2022 - last visit w/[facility] 11/2019 Lona Millard MD). Reported 35m worsening fatigue, was referred for home sleep study given c/f OSA. Carvedilol, ASA 81, atorva 20, torsemide 20, metolazone 10, spirono 25, amlo 7.5, atenolol 100, doxazosin 4, lisino 40, hydralazine 100 bid all cont'd. Plan was RTC in 43m (early 2022) - no f/u  in future appts. Today, ***  Re-refer to [facility] for re-engagement.        Pulmonary disease  - Abnormal chest CTs (4mm RLL nodule stable 03/2020 -> 07/2021, ?fibrotic changes 07/2021, 09/2021)  - {ID Clinic - MDM 2021 (Optional):73327}  - 15 PY smoking hx, quit 1983  - {ID Clinic - MDM 2021 (Optional):73327}    03/2022 - Saw Interventional Pulm in 09/2021. CTs reviewed, they felt the RLL nodule was of no signif concern given stability of 2Y timeline - but ordered HRCT given lower lung fibrotic vs atelectatic changes. HRCT completed 16May2023 with impression: Persistent but improved lower lung predominant subpleural reticular abnormality which may represent residual mild fibrosis, possibly from resolving organizing pneumonia. Representative cut (138/239) below. Plan for f/u not described in note.  Today, ***        ***Refer to see Dr. Sallyanne Kuster (embedded Pulm in ID Clinic)      Orthopaedic issues  - s/p ORIF R acetabular fx 1990s (@ all cont'd. Plan was RTC in 44m (early 2022) - no f/u in future appts. Today, feeling well - no CP/DOE reported. He's walking frequently but no signif other exercise.  Re-refer to [facility] for re-engagement.      Pulmonary disease  - Abnormal chest CTs (4mm RLL nodule stable 03/2020 -> 07/2021, ?fibrotic changes 07/2021, 09/2021)  - chronic, stable  - 15 PY smoking hx, quit 1983  - chronic, stable    03/2022 - Saw Interventional Pulm in 09/2021. CTs reviewed, they felt the RLL nodule was of no signif concern given stability of 2Y timeline - but ordered HRCT given lower lung fibrotic vs atelectatic changes. HRCT completed 16May2023 with impression: Persistent but improved lower lung predominant subpleural reticular abnormality which may represent residual mild fibrosis, possibly from resolving organizing pneumonia. Representative cut (138/239) below. Plan for f/u not described in note.  Today, doing well - no interim issues.        Will consider referral to see Dr. Sallyanne Kuster (embedded Pulm in ID Clinic) - need to discuss @ future RTC      Orthopaedic issues  - s/p ORIF R acetabular fx 1990s (@ [facility]), c/b displaced screw in quadriceps (chronic)  - chronic, stable  - s/p lumbar spinal surgery 1990s, c/b chronic LBP  - chronic, stable  - BLE numbness involving both feet, proximally to knees  - chronic, stable  03/2022 - Saw Ortho most recently 07/2020 ([facility]). Today, doing OK - just describes ongoing issues w/neuropathic discomfort in BLEs.  Monitor over time      Endocrine & metabolic issues  - T2DM without use of insulin (A1c 6.5% in 08/2021)  - chronic, stable   - Obesity (BMI 30.5)  - chronic, progressive   - Gout  - chronic, stable   - Abnormal TFTs (low TSH 10/2019 @ [facility], 03/2021 @ [facility])  - chronic, stable   - Abnormal lipids, on statin (HDL 33, LDL 36 in 03/2021 @ [facility])  - chronic, stable   - Normal 25(OH)D level 10/2019  - chronic, stable     Wt Readings from Last 1 Encounters:   03/12/22 99.3 kg (219 lb) (Male) 06/08/2018       GC/CT NAATs - {ID Clinic - GC/CT Testing:61198}  RPR - {ID Clinic - Syphilis Testing:61199}      Health maintenance  - {ID Clinic - MDM 2021 (Optional):73327}    Oral health  He {DOES /DOES UJW:11914} have a dentist. Last dental exam ***. Confirmed 12Oct2023.  Eye health  He {DOES /DOES ZOX:09604} use corrective lenses. Last eye exam ***. Confirmed 12Oct2023.    Metabolic conditions  Wt Readings from Last 5 Encounters:   09/30/21 99.3 kg (219 lb)   09/23/21 97.8 kg (215 lb 9.6 oz)   06/24/21 96.2 kg (212 lb)   06/06/21 98 kg (216 lb)   03/04/21 96.1 kg (211 lb 12.8 oz)     Lab Results   Component Value Date    CREATININE 3.26 (H) 09/23/2021    PROTEINUR 6.9 03/05/2020    GLUCOSEU Negative 04/14/2016    ALBCRERAT 9.7 09/23/2021    PCRATIOUR 0.075 03/05/2020    GLU 161 09/23/2021    A1C 6.5 (H) 09/23/2021    ALT 106 (H) 09/23/2021    ALT 128 (H) 06/06/2021    ALT 153 (H) 02/26/2021    VITDTOTAL 50.7 11/22/2019     # Kidney health - {ID Clinic - Kidney Screening (Optional):77698}  # Bone health - {ID Clinic - Bone Screening (Optional):77701}  # Diabetes assessment - {ID Clinic - Diabetes Screening (Optional):77691}  # NAFLD assessment - {ID Clinic - NAFLD Screening (Optional):77716}    Communicable diseases  Lab Results   Component Value Date    QFTTBGOLD Negative 01/29/2020    QTBG NEGATIVE 07/01/2011    HEPAIGG Reactive (A) 01/29/2020    HEPBSAB Nonreactive 01/29/2020    HEPBCAB Reactive (A) 01/31/2020    HEPCAB Reactive (A) 01/29/2020    HCVRNA Not Detected 06/06/2021    HCVRNAIU 328,448 (H) 02/05/2016    HCVIU 540981 08/01/2014    RUBIG Positive 01/29/2020    VZVIGG Positive 01/29/2020     # TB screening - no longer needed; negative IGRA, low risk  # Hepatitis screening - repeat HCV screen periodically  # MMR screening - no further assessment needed    Cancer screening  Lab Results   Component Value Date    PSASCRN 0.4 07/09/2010    PSA 0.35 01/29/2020     PSA screening also negative in 10/2020 ([facility] = 0.37)    # Anorectal - {ID Clinic - Anorectal Cancer Screening (Optional):77709}  # Colorectal -  FIT+DNA neg 10/2020 via PCP - but needs c-scope for transplant w/u per Dr Stefano Gaul note 08/2021  # Liver -  most recent ultrasound 18Jan2022 - neg  # Lung - screening not indicated  # Prostate - repeat PSA 67m from prior  (mid-to-late 2024)    Cardiovascular disease  Lab Results   Component Value Date    CHOL 88 01/29/2020    HDL 23 (L) 01/29/2020    LDL 27 (L) 01/29/2020    NONHDL 65 (L) 01/29/2020    TRIG 188 (H) 01/29/2020     # The ASCVD Risk score (Arnett DK, et al., 2019) failed to calculate.  - {is, is not:60834} taking aspirin   - {is, is not:60834} taking statin  - BP control {DESC; POOR/FAIR/GOOD/EXCELLENT:19665}  - {current, former, never:62671} smoker    # AAA screening - assessment needed but deferred to future visit    Immunization History   Administered Date(s) Administered    COVID-19 VAC,BIVALENT,MODERNA(BLUE CAP) 02/26/2021, 01/23/2022    COVID-19 VACCINE,MRNA(MODERNA)(PF) 08/12/2019, 03/27/2020, 07/27/2020    HEPATITIS B VACCINE ADULT, ADJUVANTED, IM(HEPLISAV B) 01/31/2020    HEPATITIS B VACCINE ADULT,IM(ENERGIX B, RECOMBIVAX) 04/06/2016, 05/21/2016, 08/24/2016    INFLUENZA INJ MDCK PF, QUAD,(FLUCELVAX)(58MO AND UP EGG FREE) 02/28/2019    INFLUENZA TIV (TRI) PF (IM) 02/08/2008, 03/26/2010, 03/25/2011    Influenza  Vaccine Quad (IIV4 PF) 52mo+ injectable 07/24/2015, 02/05/2016, 03/08/2018, 03/05/2020, 02/26/2021    Influenza Virus Vaccine, unspecified formulation 06/15/2014, 07/24/2015, 02/05/2016, 03/15/2016, 03/31/2017    PNEUMOCOCCAL POLYSACCHARIDE 23 07/13/2000, 08/19/2005, 12/01/2017    PPD Test 12/29/2006, 07/11/2008, 03/26/2010, 03/25/2011    Pneumococcal Conjugate 13-Valent 04/13/2012    SHINGRIX-ZOSTER VACCINE (HZV), RECOMBINANT,SUB-UNIT,ADJUVANTED IM 09/16/2016, 12/01/2017    TdaP 08/31/2007, 08/04/2010, 10/27/2018    Tuberculin Skin Test;unspecified Formulation 12/29/2006, 07/11/2008, 03/26/2010, 03/25/2011       Immunizations needed - COVID, influenza, PCV-20, and RSV      I personally spent *** minutes face-to-face and non-face-to-face in the care of this patient, which includes all pre, intra, and post visit time on the date of service.      START 15:20 ***  END 16:38  ADD 78***          Disposition  Next appointment: {ID Clinic - Return to Clinic (Optional):77746}      To do @ next RTC  ***          Subjective        HPI  In addition to details in A&P above:  ***      Past Medical History:   Diagnosis Date    CKD (chronic kidney disease) stage 3, GFR 30-59 ml/min (CMS-HCC) 01/08/2015    Coronary artery disease 10/2015    Multivessel disease, plan medical management after cath 6/17    GERD (gastroesophageal reflux disease)     Gout     HIV (human immunodeficiency virus infection) (CMS-HCC)     Undectable viral load 5/17    Hypertension     Nephrolithiasis     Nephrotic syndrome 03/05/2014    Renal mass     Followed by neprhology, MRI 8/16 improved, felt to be benign         Social History  Background - ***    Housing - {ID Clinic - Housing (Optional):77755} {ID Clinic - Lives with (Optional):77756}  School / Work & Benefits - {ID Clinic - School/Work/Benefits (Optional):77757}    Tobacco - {ID Clinic - Tobacco Use (Optional):77747}  Alcohol - {ID Clinic - Alcohol Use (Optional):77748}  Substance use - {IDSubstance Use- previous (list), active (list) :60796}      Medications and Allergies  He has a current medication list which includes the following prescription(s): dicyclomine, abacavir, albuterol, allopurinol, amlodipine, aspirin, atenolol, atorvastatin, baclofen, onetouch verio test strips, calcium carbonate-vitamin d2, carvedilol, cholecalciferol (vitamin d3 25 mcg (1,000 units)), clonidine hcl, diazepam, docusate sodium, tivicay, dorzolamide-timolol, descovy, ferrous sulfate, folic acid, gabapentin, glimepiride, hydrocodone-acetaminophen, lisinopril, nitroglycerin, nystatin, ondansetron, oxycodone-acetaminophen, rocklatan, sertraline, sitagliptin phosphate, spironolactone, tamsulosin, timolol, and torsemide.    Allergies: Colchicine analogues and Tramadol      Family History  His family history includes Cancer in his father and mother; Diabetes in his brother; Heart disease (age of onset: 87) in his sister; Hypertension in his mother and sister; Kidney disease in his father; Kidney failure (age of onset: 3) in his sister; Thyroid disease in his mother.           Objective      There were no vitals taken for this visit. ***    Const WDWN, NAD, non-toxic appearance ***   Eyes lids normal bilaterally, conjunctiva anicteric and noninjected OU ***  PERRL ***   ENMT normal appearance of external nose and ears, no nasal discharge ***  OP clear ***   Neck neck of normal appearance and trachea midline ***  no thyromegaly, nodules, or  tenderness ***   Lymph no LAD in neck ***   CV RRR, no m/r/g, S1/S2 ***  no peripheral edema, WWP ***   Resp normal WOB ***  on RA, no breathlessness with speaking, no coughing, CTAB ***   GI normal inspection, NTND, NABS ***  no umbilical hernia on exam ***   GU deferred   MSK no clubbing or cyanosis of hands ***  no focal tenderness or abnormalities of joints of RUE, LUE, RLE, or LLE ***   Skin no rashes, lesions, or ulcers of visualized skin ***  no nodules or areas of induration of palpated skin ***   Neuro CNs II-XII grossly intact ***  sensation to light touch grossly intact throughout ***   Psych appropriate affect ***  oriented to person, place, time *** oxycodone-acetaminophen, rocklatan, sertraline, sitagliptin phosphate, spironolactone, tamsulosin, timolol, and torsemide.    Allergies: Colchicine analogues and Tramadol      Family History  His family history includes Cancer in his father and mother; Diabetes in his brother; Heart disease (age of onset: 67) in his sister; Hypertension in his mother and sister; Kidney disease in his father; Kidney failure (age of onset: 37) in his sister; Stroke in his brother; Thyroid disease in his mother.               Objective      BP 154/96 (BP Site: L Arm, BP Position: Sitting, BP Cuff Size: Large)  - Pulse 61  - Temp 36.5 ??C (97.7 ??F) (Oral)  - Ht 177.8 cm (5' 10)  - Wt 99.3 kg (219 lb)  - BMI 31.42 kg/m??      Const WDWN, NAD, non-toxic appearance    Eyes lids normal bilaterally, conjunctiva anicteric and noninjected OU   PERRL    ENMT normal appearance of external nose and ears, no nasal discharge   OP clear - edentulous - dentures in upper and lower, visibly mobile/loose on exam   Neck neck of normal appearance and trachea midline   no thyromegaly, nodules, or tenderness    Lymph no LAD in neck    CV RRR, no r/g, S1/S2 - difficult auscultation but perhaps gr1/6 systolic murmur present  no peripheral edema, WWP    Resp normal WOB   on RA, no breathlessness with speaking, no coughing, CTAB    GI normal inspection, NTND, NABS - protuberant  no umbilical hernia on exam    GU deferred   MSK no clubbing or cyanosis of hands   no focal tenderness or abnormalities of joints of RUE, LUE, RLE, or LLE    Skin no rashes, lesions, or ulcers of visualized skin   no nodules or areas of induration of palpated skin    Neuro CNs II-XII grossly intact   sensation to light touch grossly intact throughout    Psych appropriate affect   oriented to person, place, time

## 2022-03-12 ENCOUNTER — Ambulatory Visit: Admit: 2022-03-12 | Discharge: 2022-03-13 | Payer: MEDICARE

## 2022-03-12 DIAGNOSIS — Z113 Encounter for screening for infections with a predominantly sexual mode of transmission: Principal | ICD-10-CM

## 2022-03-12 DIAGNOSIS — I2510 Atherosclerotic heart disease of native coronary artery without angina pectoris: Principal | ICD-10-CM

## 2022-03-12 DIAGNOSIS — R7989 Other specified abnormal findings of blood chemistry: Principal | ICD-10-CM

## 2022-03-12 DIAGNOSIS — Z5181 Encounter for therapeutic drug level monitoring: Principal | ICD-10-CM

## 2022-03-12 DIAGNOSIS — Z9189 Other specified personal risk factors, not elsewhere classified: Principal | ICD-10-CM

## 2022-03-12 DIAGNOSIS — I5030 Unspecified diastolic (congestive) heart failure: Principal | ICD-10-CM

## 2022-03-12 DIAGNOSIS — R768 Other specified abnormal immunological findings in serum: Principal | ICD-10-CM

## 2022-03-12 DIAGNOSIS — E669 Obesity, unspecified: Principal | ICD-10-CM

## 2022-03-12 DIAGNOSIS — K7581 Nonalcoholic steatohepatitis (NASH): Principal | ICD-10-CM

## 2022-03-12 DIAGNOSIS — I214 Non-ST elevation (NSTEMI) myocardial infarction: Principal | ICD-10-CM

## 2022-03-12 DIAGNOSIS — Z23 Encounter for immunization: Principal | ICD-10-CM

## 2022-03-12 DIAGNOSIS — B20 Human immunodeficiency virus [HIV] disease: Principal | ICD-10-CM

## 2022-03-12 DIAGNOSIS — Z79899 Other long term (current) drug therapy: Principal | ICD-10-CM

## 2022-03-12 DIAGNOSIS — E1140 Type 2 diabetes mellitus with diabetic neuropathy, unspecified: Principal | ICD-10-CM

## 2022-03-12 DIAGNOSIS — N184 Chronic kidney disease, stage 4 (severe): Principal | ICD-10-CM

## 2022-03-12 LAB — CBC W/ AUTO DIFF
BASOPHILS ABSOLUTE COUNT: 0 10*9/L (ref 0.0–0.1)
BASOPHILS RELATIVE PERCENT: 0.5 %
EOSINOPHILS ABSOLUTE COUNT: 0.1 10*9/L (ref 0.0–0.5)
EOSINOPHILS RELATIVE PERCENT: 1.4 %
HEMATOCRIT: 42.3 % (ref 39.0–48.0)
HEMOGLOBIN: 14.3 g/dL (ref 12.9–16.5)
LYMPHOCYTES ABSOLUTE COUNT: 1.8 10*9/L (ref 1.1–3.6)
LYMPHOCYTES RELATIVE PERCENT: 24.9 %
MEAN CORPUSCULAR HEMOGLOBIN CONC: 33.7 g/dL (ref 32.0–36.0)
MEAN CORPUSCULAR HEMOGLOBIN: 31.8 pg (ref 25.9–32.4)
MEAN CORPUSCULAR VOLUME: 94.3 fL (ref 77.6–95.7)
MEAN PLATELET VOLUME: 8.9 fL (ref 6.8–10.7)
MONOCYTES ABSOLUTE COUNT: 0.7 10*9/L (ref 0.3–0.8)
MONOCYTES RELATIVE PERCENT: 9.3 %
NEUTROPHILS ABSOLUTE COUNT: 4.7 10*9/L (ref 1.8–7.8)
NEUTROPHILS RELATIVE PERCENT: 63.9 %
NUCLEATED RED BLOOD CELLS: 0 /100{WBCs} (ref <=4)
PLATELET COUNT: 138 10*9/L — ABNORMAL LOW (ref 150–450)
RED BLOOD CELL COUNT: 4.49 10*12/L (ref 4.26–5.60)
RED CELL DISTRIBUTION WIDTH: 13 % (ref 12.2–15.2)
WBC ADJUSTED: 7.4 10*9/L (ref 3.6–11.2)

## 2022-03-12 LAB — ALT: ALT (SGPT): 81 U/L — ABNORMAL HIGH (ref 10–49)

## 2022-03-12 LAB — BASIC METABOLIC PANEL
ANION GAP: 8 mmol/L (ref 5–14)
BLOOD UREA NITROGEN: 24 mg/dL — ABNORMAL HIGH (ref 9–23)
BUN / CREAT RATIO: 9
CALCIUM: 10.3 mg/dL (ref 8.7–10.4)
CHLORIDE: 104 mmol/L (ref 98–107)
CO2: 27.7 mmol/L (ref 20.0–31.0)
CREATININE: 2.62 mg/dL — ABNORMAL HIGH
EGFR CKD-EPI (2021) MALE: 25 mL/min/{1.73_m2} — ABNORMAL LOW (ref >=60)
GLUCOSE RANDOM: 142 mg/dL (ref 70–179)
POTASSIUM: 4.9 mmol/L — ABNORMAL HIGH (ref 3.4–4.8)
SODIUM: 140 mmol/L (ref 135–145)

## 2022-03-12 LAB — CYSTATIN C
CYSTATIN C: 2.26 mg/L — ABNORMAL HIGH (ref 0.64–1.23)
EGFR CKD-EPI (2012) CYSTATIN C MALE: 25 mL/min/{1.73_m2} — ABNORMAL LOW (ref >=60)

## 2022-03-12 LAB — T3, FREE: T3 FREE: 3.07 pg/mL (ref 2.30–4.20)

## 2022-03-12 LAB — BILIRUBIN, TOTAL: BILIRUBIN TOTAL: 1.1 mg/dL (ref 0.3–1.2)

## 2022-03-12 LAB — TSH: THYROID STIMULATING HORMONE: 0.508 u[IU]/mL — ABNORMAL LOW (ref 0.550–4.780)

## 2022-03-12 LAB — SYPHILIS SCREEN: SYPHILIS RPR SCREEN: NONREACTIVE

## 2022-03-12 LAB — AST: AST (SGOT): 55 U/L — ABNORMAL HIGH (ref <=34)

## 2022-03-12 LAB — T4, FREE: FREE T4: 0.86 ng/dL — ABNORMAL LOW (ref 0.89–1.76)

## 2022-03-13 LAB — HIV RNA, QUANTITATIVE, PCR
HIV RNA QNT RSLT: DETECTED — AB
HIV RNA: <20 {copies}/mL — ABNORMAL HIGH (ref <0)

## 2022-03-13 NOTE — Unmapped (Signed)
Addended by: Jillene Bucks on: 03/12/2022 06:12 PM     Modules accepted: Orders

## 2022-03-16 DIAGNOSIS — N184 Chronic kidney disease, stage 4 (severe): Principal | ICD-10-CM

## 2022-03-16 DIAGNOSIS — E1122 Type 2 diabetes mellitus with diabetic chronic kidney disease: Principal | ICD-10-CM

## 2022-03-23 NOTE — Unmapped (Signed)
HISTORY OF PRESENT ILLNESS:      [name] is a 71 year old man with stage G4:A2 chronic kidney disease who is evaluated today for stage G4:A2 chronic kidney disease.  His blood pressures been reasonably well controlled.  Home readings have been running in the 130s over 80s mmHg. He missed taking his antihypertensive therapy this morning in an effort to make it to clinic on time.  As a result, his office measured blood pressure is somewhat elevated.  He continues to have symptoms secondary to diabetic neuropathy.  Gabapentin partially ameliorates his discomfort.  He recently went on a trip with his family to [place] to celebrate his 71th birthday.  He has not had symptomatic hypoglycemia.  His gout has been quiescent.  He does not have cough, chest pain, PND, or orthopnea.  He continues to consume a lot of relatively healthy diet enriched with vegetables.  He recently saw Dr. Ian [name] in consultation and noted that his HAART therapy may require modification because his eGFR is now less than 30.  This was confirmed with a cystatin C level, which showed an eGFR of 25 mL/min.  He confirmed his continued interest in pursuing transplantation.  We discussed that his risk of requiring dialysis and transplant at 5 years is about 25%.  He knows that he will require a colonoscopy as part of this evaluation, and he cooperated his interest to schedule that study.  He was provided the number for GI procedures and an order has been placed.    MEDICATIONS:    Current Outpatient Medications   Medication Sig Dispense Refill    albuterol (PROVENTIL HFA;VENTOLIN HFA) 90 mcg/actuation inhaler Inhale 2 puffs. Take as needed      allopurinoL (ZYLOPRIM) 100 MG tablet Take 2.5 tablets (250 mg total) by mouth daily.      amLODIPine (NORVASC) 10 MG tablet Take 1 tablet (10 mg total) by mouth daily. 90 tablet 3    aspirin (ECOTRIN) 81 MG tablet daily.   0    atenoloL (TENORMIN) 100 MG tablet Take 1 tablet (100 mg total) by mouth daily. 90 tablet 3    atorvastatin (LIPITOR) 40 MG tablet Take 1 tablet (40 mg total) by mouth daily.      BACLOFEN, BULK, TOP Apply topically. Baclofen 2%, Diclofenac 5%, Gabapentin 6%, Tetracaine 3%    Take as needed      blood sugar diagnostic (ONETOUCH VERIO TEST STRIPS) Strp Use 2 (two) times daily E11.22      calcium carbonate-vitamin D2 500 mg(1,250mg ) -200 unit tablet Take 1 tablet (500 mg total) by mouth Two (2) times a day.      carvediloL (COREG) 12.5 MG tablet 1 tablet (12.5 mg total) Two (2) times a day.      cholecalciferol, vitamin D3 25 mcg, 1,000 units,, 1,000 unit (25 mcg) tablet Take by mouth daily.      cloNIDine HCL (CATAPRES) 0.1 MG tablet Take 1 tablet (0.1 mg total) by mouth 3 (three) times a day. 270 tablet 3    diazepam (VALIUM) 5 MG tablet Take 1 tablet (5 mg total) by mouth nightly.      dicyclomine (BENTYL) 20 mg tablet       dolutegravir (TIVICAY) 50 mg TABLET Take 1 tablet (50 mg total) by mouth daily. 30 tablet 11    dorzolamide-timoloL (COSOPT) 22.3-6.8 mg/mL ophthalmic solution Administer 1 drop to both eyes Two (2) times a day.      emtricitabine-tenofovir alafen (DESCOVY) 200-25 mg tablet Take 1 tablet  by mouth daily. 30 tablet 11    ferrous sulfate 325 (65 FE) MG tablet Take 1 tablet (325 mg total) by mouth in the morning.      folic acid (FOLVITE) 1 MG tablet   0    gabapentin (NEURONTIN) 300 MG capsule 1 capsule (300 mg total) Two (2) times a day.      glimepiride (AMARYL) 4 MG tablet Take 1 tablet (4 mg total) by mouth daily. 90 tablet 3    HYDROcodone-acetaminophen (NORCO) 5-325 mg per tablet Directions are take one tablet 30 minutes before physical therapy, then take one tablet at bedtime as needed for pain      lisinopriL (PRINIVIL,ZESTRIL) 40 MG tablet Take 1 tablet (40 mg total) by mouth daily. 90 tablet 3    nitroglycerin (NITROSTAT) 0.4 MG SL tablet DISSOLVE ONE TABLET UNDER TONGUE AS NEEDED FOR CHEST PAIN EVERY 5 MINUTES AS DIRECTED 25 tablet 0    nystatin (MYCOSTATIN) 100,000 unit/mL suspension Take 5 mL (500,000 Units total) by mouth daily.      ondansetron (ZOFRAN) 4 MG tablet take 1 tablet by mouth every 8 hours if needed for nausea      oxyCODONE-acetaminophen (PERCOCET) 5-325 mg per tablet Take 1 tablet by mouth in the morning.      ROCKLATAN 0.02-0.005 % Drop INT 1 GTT INTO OU QHS      sertraline (ZOLOFT) 50 MG tablet take 1 tablet by mouth once daily      SITagliptin (JANUVIA) 50 MG tablet Take 1 tablet (50 mg total) by mouth daily. 90 tablet 3    spironolactone (ALDACTONE) 25 MG tablet Take 1 tablet (25 mg total) by mouth daily. 90 tablet 3    tamsulosin (FLOMAX) 0.4 mg capsule TAKE 1 CAPSULE(0.4 MG) BY MOUTH DAILY 90 capsule 3    timolol (TIMOPTIC) 0.5 % ophthalmic solution INSTILL 1 DROP IN BOTH EYES EVERY MORNING      torsemide (DEMADEX) 20 MG tablet Take 2 tablets (40 mg total) by mouth daily. 90 tablet 3     No current facility-administered medications for this visit.       REVIEW OF SYSTEMS:  Constitutional: No fevers or chills. Cardiovascular: No chest pain.  Pulmonary: No cough or hemoptysis.  All other systems are reviewed and are negative except that noted in the history of present illness.    PHYSICAL EXAMINATION:      Constitutional: He is in no acute distress.  Vital signs: BP 164/91 (BP Site: L Arm, BP Position: Sitting, BP Cuff Size: Medium)  - Pulse 63  - Temp 36.4 ??C (97.6 ??F) (Temporal)  - Ht 177.8 cm (5' 10)  - Wt 100.2 kg (221 lb)  - BMI 31.71 kg/m??   Wt Readings from Last 3 Encounters:   03/26/22 100.2 kg (221 lb)   03/12/22 99.3 kg (219 lb)   09/30/21 99.3 kg (219 lb)   PTHomeBP  HEENT: Masked.    Neck: Supple without lymphadenopathy or thyromegaly. No carotid bruits.   Lungs: Clear to auscultation   Heart: Regular rate and rhythm. Normal S1-S2.   Abdomen: Soft, nontender, normoactive bowel sounds.  Liver span is about 9 cm to percussion.  Neurologic: Alert and oriented x 3.  Strength is normal.  DTRs are 2+ and symmetric.  There are no lateralizing sensory deficits.  Extremities: No edema.  Dorsalis pedis and posterior tibial pulses are palpable.    Node survey: No lymphadenopathy.   Musculoskeletal: No active synovitis.   Skin/Nails:  No  rash or nail changes.      LABORATORY STUDIES:    03/24/2022    Sodium 142 potassium 4.7 chloride 107 total CO2 27 BUN 31 creatinine 3.0 eGFR 25 glucose 109 calcium 10.5 AST 75 ALT 111 alk phos 77 albumin 4.7 bilirubin 0.7 total protein 7.6    White count 11.6 hemoglobin 14.8 platelets 145    Cholesterol 86 triglycerides 85 HDL cholesterol 33 LDL 36    Urine albumin to creatinine ratio 121 mg/g    Hemoglobin A1c 5.6    TSH 0.311    Serum creatinine trend:    Date Value   03/24/2022 3.00 (H)   03/12/2022 2.62 (H)   09/23/2021 3.26 (H)   06/06/2021 2.81 (H)   02/26/2021 2.18 (H)   01/29/2021 2.63 (H)   01/02/2021 2.62 (H)   11/08/2020 2.70 (H)     Imaging:    Chest CT 06/24/2021    Chest CT shows stability of the right pulmonary nodule.  However, it showed increased bilateral lower lobe groundglass and reticular abnormality may represent changes of infectious or organizing pneumonia (including autoimmune or medication-related etiologies).      IMPRESSION AND PLAN:    1.  Stage G4/A2 chronic kidney disease.  His renal function is relatively stable.  His risk of progression to requiring dialysis or transplant at 2 years is about 8% and 23% at 5 years.  He confirmed his continued interest with pursuing transplantation.  We talked about dialysis options, but it is too early to pursue vascular access at this time.  His progression of CKD has been relatively slow over the past 5 years.  I favor continued evaluation to ascertain whether he is a candidate for future kidney transplantation.  Ongoing intensive monitoring of his kidney function is indicated because of his risk for progression as well as multiple potential drug interactions including the impact of estimated GFR on HAART.     2.  Hypertension.  His home blood pressures confirm that he is at goal.  He will continue his current regimen of amlodipine 10 mg daily, lisinopril 40 mg daily, hydralazine 100 mg twice daily, atenolol 100 mg once daily, doxazosin 4 mg daily, torsemide 40 mg daily, and spironolactone 25 mg daily.    3.  Human immunodeficiency virus disease.  He is now seeing Dr. Ian [name].  Dr. Ian [name] is considering altering his current regimen because of the reduction in the estimated GFR below 30 ml/min.    4.  Lower urinary tract symptoms.  His symptoms are well controlled on tamsulosin.    5.  Secondary/tertiary hyperparathyroidism.  Although the total calcium level was somewhat elevated, his serum albumin level was also elevated, suggesting that the ionized calcium level is acceptable.  He had recent labs through his primary care physician.  On follow-up, I will obtain a PTH, calcium, and serum phosphorus level.      6.  Type 2 diabetes mellitus.  We will continue to follow-up with Dr. Marcello Fennel, who is managing his diabetes mellitus.  I believe he would be a good candidate for GLP-1 agonist therapy if this is financially feasible.  These agents are well-established to mitigate cardiovascular and renal risk over time.  I will defer his management to Dr. Marcello Fennel.    7.  4 mm RLL pulmonary nodule and groundglass infiltrates on chest x-ray.  His pulmonary symptoms are now resolved.  He will likely require a follow-up chest x-ray as part of his transplant evaluation.    8.  Metabolic dysfunction associated with fatty liver disease.  This is another diagnosis that bespeaks toward consideration of GLP-1 receptor agonist therapy because of its benefit on metabolic syndrome.      9.  Chronic low back pain.  He will continue to follow-up with his spine specialist.    10. Peripheral neuropathy.  He will continue gabapentin.    11. Follow up plans.  He will schedule the colonoscopy.  He will follow-up with Dr. Ian [name] regarding his HAART regimen.  I will plan to see him back again in about 3 months.    Zetta Bills. Stefano Gaul, MD  Date of service 03/26/2022

## 2022-03-23 NOTE — Unmapped (Signed)
[facility] Refill Coordination Note    Specialty Medication(s) to be Shipped:   Infectious Disease: Descovy and Tivicay    Other medication(s) to be shipped: No additional medications requested for fill at this time     Samuel [name], DOB: 1950/10/09  Phone: There are no phone numbers on file.      All above HIPAA information was verified with patient.     Was a Nurse, learning disability used for this call? No    Completed refill call assessment today to schedule patient's medication shipment from the [facility] [phone]).  All relevant notes have been reviewed.     Specialty medication(s) and dose(s) confirmed: Regimen is correct and unchanged.   Changes to medications: Samuel [name] reports no changes at this time.  Changes to insurance: No  New side effects reported not previously addressed with a pharmacist or physician: None reported  Questions for the pharmacist: No    Confirmed patient received a Conservation officer, historic buildings and a Surveyor, mining with first shipment. The patient will receive a drug information handout for each medication shipped and additional FDA Medication Guides as required.       DISEASE/MEDICATION-SPECIFIC INFORMATION        N/A    SPECIALTY MEDICATION ADHERENCE     Medication Adherence    Patient reported X missed doses in the last month: 0  Specialty Medication: descovy 200-25mg   Patient is on additional specialty medications: Yes  Additional Specialty Medications: Tivicay 50mg   Patient Reported Additional Medication X Missed Doses in the Last Month: 0                          Were doses missed due to medication being on hold? No    descovy 200-25mg   : 5 days of medicine on hand   tivicay 50mg   : 5 days of medicine on hand       REFERRAL TO PHARMACIST     Referral to the pharmacist: Not needed      [facility]     Shipping address confirmed in Epic.     Delivery Scheduled: Yes, Expected medication delivery date: 10/25.     Medication will be delivered via Next Day Courier to the prescription address in Epic WAM.    Samuel [name]   [facility] Specialty Technician

## 2022-03-24 MED FILL — TIVICAY 50 MG TABLET: ORAL | 30 days supply | Qty: 30 | Fill #1

## 2022-03-24 MED FILL — DESCOVY 200 MG-25 MG TABLET: ORAL | 30 days supply | Qty: 30 | Fill #1

## 2022-03-26 ENCOUNTER — Ambulatory Visit: Admit: 2022-03-26 | Discharge: 2022-03-27 | Payer: MEDICARE | Attending: Nephrology | Primary: Nephrology

## 2022-03-26 DIAGNOSIS — Z1211 Encounter for screening for malignant neoplasm of colon: Principal | ICD-10-CM

## 2022-03-26 DIAGNOSIS — B20 Human immunodeficiency virus [HIV] disease: Principal | ICD-10-CM

## 2022-03-26 DIAGNOSIS — N184 Chronic kidney disease, stage 4 (severe): Principal | ICD-10-CM

## 2022-03-26 DIAGNOSIS — M1000 Idiopathic gout, unspecified site: Principal | ICD-10-CM

## 2022-03-26 DIAGNOSIS — K7581 Nonalcoholic steatohepatitis (NASH): Principal | ICD-10-CM

## 2022-03-26 DIAGNOSIS — I2510 Atherosclerotic heart disease of native coronary artery without angina pectoris: Principal | ICD-10-CM

## 2022-03-26 DIAGNOSIS — I151 Hypertension secondary to other renal disorders: Principal | ICD-10-CM

## 2022-03-26 DIAGNOSIS — E1140 Type 2 diabetes mellitus with diabetic neuropathy, unspecified: Principal | ICD-10-CM

## 2022-03-26 DIAGNOSIS — I5032 Chronic diastolic (congestive) heart failure: Principal | ICD-10-CM

## 2022-03-26 DIAGNOSIS — F3340 Major depressive disorder, recurrent, in remission, unspecified: Principal | ICD-10-CM

## 2022-03-26 NOTE — Unmapped (Addendum)
Call GI procedures at [phone] to schedule colonoscopy  I will confer with Dr. Ian Bushman regarding you medications  Return for follow up in 3 months

## 2022-03-26 NOTE — Unmapped (Signed)
Addended by: Kirstie Peri L on: 03/26/2022 10:20 AM     Modules accepted: Orders

## 2022-04-09 DIAGNOSIS — N184 Chronic kidney disease, stage 4 (severe): Principal | ICD-10-CM

## 2022-04-09 DIAGNOSIS — Z79899 Other long term (current) drug therapy: Principal | ICD-10-CM

## 2022-04-09 DIAGNOSIS — B20 Human immunodeficiency virus [HIV] disease: Principal | ICD-10-CM

## 2022-04-09 MED ORDER — DORAVIRINE 100 MG TABLET
ORAL_TABLET | Freq: Every day | ORAL | 11 refills | 30 days | Status: CP
Start: 2022-04-09 — End: ?
  Filled 2022-04-13: qty 30, 30d supply, fill #0

## 2022-04-09 NOTE — Unmapped (Signed)
After last visit in [facility] (initial visit w/me), saw that his cystatin C-corrected eGFR was the same as his regular CKD-EPI eGFR - both at 25 mL/min. This is below the threshold for recommended daily dosing of FTC/TAF (lower cutoff is 30 mL/min).    Reviewed his prior ART regimens and looked at available options that would permit once-daily dosing. Because he didn't tolerate abacavir previously (and I have concerns about potential risk of adverse cardiovascular outcomes for him, with abacavir), there really isn't a potential regimen that includes NRTIs. That puts [place] into nuc-sparing regimens. He's tolerating dolutegravir currently, so I'd like to keep that - so that leaves the companion drug as the thing to decide on. He never failed any of his prior regimens d/t virological breakthrough or confirmed or suspected resistance, so the NNRTIs are attractive here - with doravirine (Pifeltro) being the easiest relative to rilpivirine (must be taken with a meal; can't be used with anatacids) or etravirine (unpalatable, large, chalky pills - ideally BID but can be 400/d).    Contacted him today at [phone] @ 1118 to discuss the above. Confirmed his DOB.    He says that he had a PCP appointment yesterday and that his kidneys were back down to 2.5. I'm not able to see those results in [facility]  or via CareEverywhere, but I explained to him that even if there's a bit of interval improvement, the overall trend is such that we really do need to swap out the Descovy for something else.    He asked about side effects and I outlined the main ones for him: some mild GI upset (N/V/D) for a couple of days, similar to potential with any new medication - and, less commonly, rash (a class effect of NNRTIs, regardless of generation). He was amenable to proceeding.    He also noted that his PCP had stopped his gabapentin and started a different medicine; he couldn't recall the name but when I said Lyrica or pregabalin he felt that this sounded familiar. He hasn't picked this up yet (later today). I confirmed with him that the doravirine (Pifeltro) would be OK to take with pregabalin.    I asked him to please let me know how things go with the switch - and to let me know (by calling clinic or MyChart message) if he has any side effects, esp rash. He said he'd do so.    Confirmed Rx should go to [facility].    No other Qs.        1. HIV disease (CMS-HCC)    2. Chronic kidney disease (CKD) stage G4/A2, severely decreased glomerular filtration rate (GFR) between 15-29 mL/min/1.73 square meter and albuminuria creatinine ratio between 30-299 mg/g (CMS-HCC)    3. Medication changed to therapeutic equivalent    - doravirine 100 mg Tab; Take 1 tablet (100 mg total) by mouth daily.  Dispense: 30 tablet; Refill: 11

## 2022-04-10 NOTE — Unmapped (Signed)
[facility] Specialty Medication Onboarding    Specialty Medication: Pifeltro 100mg  tablet  Prior Authorization: Not Required   Financial Assistance: No - copay  <$25  Final Copay/Day Supply: $0 / 30 days    Insurance Restrictions: None     Notes to Pharmacist: Replaces DESCOVY (which is being stopped due to eGFR < 30 mL/min)    The triage team has completed the benefits investigation and has determined that the patient is able to fill this medication at [facility]. Please contact the patient to complete the onboarding or follow up with the prescribing physician as needed.

## 2022-04-11 NOTE — Unmapped (Signed)
[facility]   Patient Onboarding/Medication Counseling    Samuel [name] is a 71 y.o. male with HIV who I am counseling today on initiation of therapy.  I am speaking to the patient.    Was a Nurse, learning disability used for this call? No    Verified patient's date of birth / HIPAA.    Specialty medication(s) to be sent: Infectious Disease: Pifeltro      Non-specialty medications/supplies to be sent: n/a      Medications not needed at this time: n/a         Pifeltro (doravirine) 100mg     Medication & Administration     Dosage: Take one tablet by mouth daily    Administration:   Take with or without food  Take at the same time of day    Adherence/Missed dose instructions: take missed dose as soon as you remember. If it is close to the time of your next dose, skip the dose and resume with your next scheduled dose.    Goals of Therapy     The goal is to suppress replication of the HIV virus such that the virus is undetectable by lab tests    Side Effects & Monitoring Parameters   Upset stomach.  Feeling dizzy, tired, or weak.  Strange or odd dreams.  Trouble sleeping.  Stomach pain or diarrhea.  Headache      The following side effects should be reported to the provider:  Signs of an allergic reaction, like rash; hives; itching; red, swollen, blistered, or peeling skin with or without fever.   If you have wheezing; tightness in the chest or throat; trouble breathing, swallowing, or talking; unusual hoarseness; or swelling of the mouth, face, lips, tongue, or throat call 911 or go to the closest hospital emergency department  Changes in your immune system can happen when you start taking drugs to treat HIV. If you have an infection that you did not know you had, it may show up when you take this drug. Tell your doctor right away if you have any new signs after you start this drug, even after taking it for several months. This includes signs of infection like fever, sore throat, weakness, cough, or shortness of breath.  Monitoring Parameters: viral load and CD4 count    Contraindications, Warnings, & Precautions     Concurrent administration of strong CYP3A inducers, including, but not limited to the following: Carbamazepine, oxcarbazepine, phenobarbital, phenytoin, enzalutamide, rifampin, rifapentine, mitotane, St John's wort  Immune reconstitution syndrome: Patients may develop immune reconstitution syndrome resulting in the occurrence of an inflammatory response to an indolent or residual opportunistic infection during initial HIV treatment or activation of autoimmune disorders (eg, Graves disease, polymyositis, Guillain-Barr?? syndrome, autoimmune hepatitis) later in therapy; further evaluation and treatment may be required.    Drug/Food Interactions     Medication list reviewed in Epic. The patient was instructed to inform the care team before taking any new medications or supplements. No drug interactions identified.   Tell your doctor if you have taken rifabutin in the past 4 weeks    Storage, Handling Precautions, & Disposal     Store in the original container at room temperature.  Store in a dry place. Do not store in a bathroom.  Keep lid tightly closed.  Keep all drugs in a safe place. Keep all drugs out of the reach of children and pets.  Throw away unused or expired drugs. Do not flush down a toilet or pour  down a drain unless you are told to do so  There may be drug take-back programs in your area.      Current Medications (including OTC/herbals), Comorbidities and Allergies     Current Outpatient Medications   Medication Sig Dispense Refill    ACCU-CHEK GUIDE L1-L2 CTRL SOL Soln       albuterol (PROVENTIL HFA;VENTOLIN HFA) 90 mcg/actuation inhaler Inhale 2 puffs. Take as needed      allopurinoL (ZYLOPRIM) 100 MG tablet Take 2.5 tablets (250 mg total) by mouth daily.      amLODIPine (NORVASC) 10 MG tablet Take 1 tablet (10 mg total) by mouth daily. 90 tablet 3    aspirin (ECOTRIN) 81 MG tablet daily.   0 atenoloL (TENORMIN) 100 MG tablet Take 1 tablet (100 mg total) by mouth daily. 90 tablet 3    atorvastatin (LIPITOR) 40 MG tablet Take 1 tablet (40 mg total) by mouth daily.      BACLOFEN, BULK, TOP Apply topically. Baclofen 2%, Diclofenac 5%, Gabapentin 6%, Tetracaine 3%    Take as needed      blood sugar diagnostic (ONETOUCH VERIO TEST STRIPS) Strp Use 2 (two) times daily E11.22      calcium carbonate-vitamin D2 500 mg(1,250mg ) -200 unit tablet Take 1 tablet (500 mg total) by mouth two (2) times a day.      carvediloL (COREG) 12.5 MG tablet 1 tablet (12.5 mg total) two (2) times a day.      cholecalciferol, vitamin D3 25 mcg, 1,000 units,, 1,000 unit (25 mcg) tablet Take by mouth daily.      cloNIDine HCL (CATAPRES) 0.1 MG tablet Take 1 tablet (0.1 mg total) by mouth 3 (three) times a day. 270 tablet 3    diazepam (VALIUM) 5 MG tablet Take 1 tablet (5 mg total) by mouth nightly.      dicyclomine (BENTYL) 20 mg tablet       dolutegravir (TIVICAY) 50 mg TABLET Take 1 tablet (50 mg total) by mouth daily. 30 tablet 11    doravirine 100 mg Tab Take 1 tablet (100 mg total) by mouth daily. 30 tablet 11    dorzolamide-timoloL (COSOPT) 22.3-6.8 mg/mL ophthalmic solution Administer 1 drop to both eyes two (2) times a day.      ferrous sulfate 325 (65 FE) MG tablet Take 1 tablet (325 mg total) by mouth in the morning.      folic acid (FOLVITE) 1 MG tablet   0    glimepiride (AMARYL) 4 MG tablet Take 1 tablet (4 mg total) by mouth daily. 90 tablet 3    HYDROcodone-acetaminophen (NORCO) 5-325 mg per tablet Directions are take one tablet 30 minutes before physical therapy, then take one tablet at bedtime as needed for pain      lisinopriL (PRINIVIL,ZESTRIL) 40 MG tablet Take 1 tablet (40 mg total) by mouth daily. 90 tablet 3    nitroglycerin (NITROSTAT) 0.4 MG SL tablet DISSOLVE ONE TABLET UNDER TONGUE AS NEEDED FOR CHEST PAIN EVERY 5 MINUTES AS DIRECTED 25 tablet 0    nystatin (MYCOSTATIN) 100,000 unit/mL suspension Take 5 mL (500,000 Units total) by mouth daily.      ondansetron (ZOFRAN) 4 MG tablet take 1 tablet by mouth every 8 hours if needed for nausea      oxyCODONE-acetaminophen (PERCOCET) 5-325 mg per tablet Take 1 tablet by mouth in the morning.      pregabalin (LYRICA) 25 MG capsule 1 capsule (25 mg total).      ROCKLATAN  0.02-0.005 % Drop INT 1 GTT INTO OU QHS      sertraline (ZOLOFT) 50 MG tablet take 1 tablet by mouth once daily      SITagliptin (JANUVIA) 50 MG tablet Take 1 tablet (50 mg total) by mouth daily. 90 tablet 3    spironolactone (ALDACTONE) 25 MG tablet Take 1 tablet (25 mg total) by mouth daily. 90 tablet 3    tamsulosin (FLOMAX) 0.4 mg capsule TAKE 1 CAPSULE(0.4 MG) BY MOUTH DAILY 90 capsule 3    timolol (TIMOPTIC) 0.5 % ophthalmic solution INSTILL 1 DROP IN BOTH EYES EVERY MORNING      torsemide (DEMADEX) 20 MG tablet Take 2 tablets (40 mg total) by mouth daily. 90 tablet 3     No current facility-administered medications for this visit.       Allergies   Allergen Reactions    Colchicine Analogues Diarrhea     Have diarrhea when taken for long periods of time    Tramadol Other (See Comments)     unknown tolerates morphine       Patient Active Problem List   Diagnosis    Human immunodeficiency virus (HIV) disease (CMS-HCC)    Hypertension    Gout    Chronic kidney disease (CKD) stage G4/A2, severely decreased glomerular filtration rate (GFR) between 15-29 mL/min/1.73 square meter and albuminuria creatinine ratio between 30-299 mg/g (CMS-HCC)    Renal mass    NSTEMI (non-ST elevated myocardial infarction) (CMS-HCC)    Chronic heart failure with preserved ejection fraction (CMS-HCC)    CAD in native artery    Chest pain    Major depressive disorder, recurrent, in remission (CMS-HCC)    Obesity (BMI 30.0-34.9)    Type 2 diabetes mellitus with diabetic neuropathy, without long-term current use of insulin (CMS-HCC)    Low serum thyroid stimulating hormone (TSH)    NASH (nonalcoholic steatohepatitis) Reviewed and up to date in Epic.    Appropriateness of Therapy     Acute infections noted within Epic:  No active infections  Patient reported infection: None    Is medication and dose appropriate based on diagnosis and infection status? Yes    Prescription has been clinically reviewed: Yes      Baseline Quality of Life Assessment      How many days over the past month did your HIV  keep you from your normal activities? For example, brushing your teeth or getting up in the morning. 0    Financial Information     Medication Assistance provided: None Required    Anticipated copay of $0.00 reviewed with patient. Verified delivery address.    Delivery Information     Scheduled delivery date: 04/13/22    Expected start date: 04/13/22    Medication will be delivered via Same Day Courier to the prescription address in [facility]  This shipment will not require a signature.      Explained the services we provide at [facility] and that each month we would call to set up refills.  Stressed importance of returning phone calls so that we could ensure they receive their medications in time each month.  Informed patient that we should be setting up refills 7-10 days prior to when they will run out of medication.  A pharmacist will reach out to perform a clinical assessment periodically.  Informed patient that a welcome packet, containing information about our pharmacy and other support services, a Notice of Privacy Practices, and a drug information  handout will be sent.      The patient or caregiver noted above participated in the development of this care plan and knows that they can request review of or adjustments to the care plan at any time.      Patient or caregiver verbalized understanding of the above information as well as how to contact the pharmacy at [phone] option 4 with any questions/concerns.  The pharmacy is open Monday through Friday 8:30am-4:30pm.  A pharmacist is available 24/7 via pager to answer any clinical questions they may have.    Patient Specific Needs     Does the patient have any physical, cognitive, or cultural barriers? No    Does the patient have adequate living arrangements? (i.e. the ability to store and take their medication appropriately) Yes    Did you identify any home environmental safety or security hazards? No    Patient prefers to have medications discussed with  Patient     Is the patient or caregiver able to read and understand education materials at a high school level or above? Yes    Patient's primary language is  English     Is the patient high risk? No    SOCIAL DETERMINANTS OF HEALTH     At the [facility], we have learned that life circumstances - like trouble affording food, housing, utilities, or transportation can affect the health of many of our patients.   That is why we wanted to ask: are you currently experiencing any life circumstances that are negatively impacting your health and/or quality of life? Patient declined to answer    Social Determinants of Health     Financial Resource Strain: Not on file   Internet Connectivity: Not on file   Food Insecurity: Not on file   Tobacco Use: Medium Risk (03/26/2022)    Patient History     Smoking Tobacco Use: Former     Smokeless Tobacco Use: Never     Passive Exposure: Never   Housing/Utilities: Not on file   Alcohol Use: Not on file   Transportation Needs: Not on file   Substance Use: Not on file   Health Literacy: Not on file   Physical Activity: Not on file   Interpersonal Safety: Not on file   Stress: Not on file   Intimate Partner Violence: Not on file   Depression: Not on file   Social Connections: Not on file       Would you be willing to receive help with any of the needs that you have identified today? Not applicable       Roderic Palau, PharmD  [facility] Specialty Pharmacist

## 2022-04-11 NOTE — Unmapped (Signed)
[facility] Clinical Assessment & Refill Coordination Note    Samuel [name], DOB: [birth year]  Phone: There are no phone numbers on file.    All above HIPAA information was verified with patient.     Was a Nurse, learning disability used for this call? No    Specialty Medication(s):   Infectious Disease: Tivicay     Current Outpatient Medications   Medication Sig Dispense Refill    ACCU-CHEK GUIDE L1-L2 CTRL SOL Soln       albuterol (PROVENTIL HFA;VENTOLIN HFA) 90 mcg/actuation inhaler Inhale 2 puffs. Take as needed      allopurinoL (ZYLOPRIM) 100 MG tablet Take 2.5 tablets (250 mg total) by mouth daily.      amLODIPine (NORVASC) 10 MG tablet Take 1 tablet (10 mg total) by mouth daily. 90 tablet 3    aspirin (ECOTRIN) 81 MG tablet daily.   0    atenoloL (TENORMIN) 100 MG tablet Take 1 tablet (100 mg total) by mouth daily. 90 tablet 3    atorvastatin (LIPITOR) 40 MG tablet Take 1 tablet (40 mg total) by mouth daily.      BACLOFEN, BULK, TOP Apply topically. Baclofen 2%, Diclofenac 5%, Gabapentin 6%, Tetracaine 3%    Take as needed      blood sugar diagnostic (ONETOUCH VERIO TEST STRIPS) Strp Use 2 (two) times daily E11.22      calcium carbonate-vitamin D2 500 mg(1,250mg ) -200 unit tablet Take 1 tablet (500 mg total) by mouth two (2) times a day.      carvediloL (COREG) 12.5 MG tablet 1 tablet (12.5 mg total) two (2) times a day.      cholecalciferol, vitamin D3 25 mcg, 1,000 units,, 1,000 unit (25 mcg) tablet Take by mouth daily.      cloNIDine HCL (CATAPRES) 0.1 MG tablet Take 1 tablet (0.1 mg total) by mouth 3 (three) times a day. 270 tablet 3    diazepam (VALIUM) 5 MG tablet Take 1 tablet (5 mg total) by mouth nightly.      dicyclomine (BENTYL) 20 mg tablet       dolutegravir (TIVICAY) 50 mg TABLET Take 1 tablet (50 mg total) by mouth daily. 30 tablet 11    doravirine 100 mg Tab Take 1 tablet (100 mg total) by mouth daily. 30 tablet 11    dorzolamide-timoloL (COSOPT) 22.3-6.8 mg/mL ophthalmic solution Administer 1 drop to both eyes two (2) times a day.      ferrous sulfate 325 (65 FE) MG tablet Take 1 tablet (325 mg total) by mouth in the morning.      folic acid (FOLVITE) 1 MG tablet   0    glimepiride (AMARYL) 4 MG tablet Take 1 tablet (4 mg total) by mouth daily. 90 tablet 3    HYDROcodone-acetaminophen (NORCO) 5-325 mg per tablet Directions are take one tablet 30 minutes before physical therapy, then take one tablet at bedtime as needed for pain      lisinopriL (PRINIVIL,ZESTRIL) 40 MG tablet Take 1 tablet (40 mg total) by mouth daily. 90 tablet 3    nitroglycerin (NITROSTAT) 0.4 MG SL tablet DISSOLVE ONE TABLET UNDER TONGUE AS NEEDED FOR CHEST PAIN EVERY 5 MINUTES AS DIRECTED 25 tablet 0    nystatin (MYCOSTATIN) 100,000 unit/mL suspension Take 5 mL (500,000 Units total) by mouth daily.      ondansetron (ZOFRAN) 4 MG tablet take 1 tablet by mouth every 8 hours if needed for nausea      oxyCODONE-acetaminophen (PERCOCET)  5-325 mg per tablet Take 1 tablet by mouth in the morning.      pregabalin (LYRICA) 25 MG capsule 1 capsule (25 mg total).      ROCKLATAN 0.02-0.005 % Drop INT 1 GTT INTO OU QHS      sertraline (ZOLOFT) 50 MG tablet take 1 tablet by mouth once daily      SITagliptin (JANUVIA) 50 MG tablet Take 1 tablet (50 mg total) by mouth daily. 90 tablet 3    spironolactone (ALDACTONE) 25 MG tablet Take 1 tablet (25 mg total) by mouth daily. 90 tablet 3    tamsulosin (FLOMAX) 0.4 mg capsule TAKE 1 CAPSULE(0.4 MG) BY MOUTH DAILY 90 capsule 3    timolol (TIMOPTIC) 0.5 % ophthalmic solution INSTILL 1 DROP IN BOTH EYES EVERY MORNING      torsemide (DEMADEX) 20 MG tablet Take 2 tablets (40 mg total) by mouth daily. 90 tablet 3     No current facility-administered medications for this visit.        Changes to medications: Samuel [name] reports starting the following medications: pregabalin and stopped gabapentin    Allergies   Allergen Reactions    Colchicine Analogues Diarrhea     Have diarrhea when taken for long periods of time    Tramadol Other (See Comments)     unknown tolerates morphine       Changes to allergies: No    SPECIALTY MEDICATION ADHERENCE     Tivicay 50 mg: unable to verify the number of days of medicine on hand       Medication Adherence    Patient reported X missed doses in the last month: 0  Specialty Medication: Tivicay 50mg   Patient is on additional specialty medications: No  Any gaps in refill history greater than 2 weeks in the last 3 months: no  Demonstrates understanding of importance of adherence: yes  Informant: patient  Provider-estimated medication adherence level: good  Patient is at risk for Non-Adherence: No      Specialty medication(s) dose(s) confirmed:  Descovy discontinued and replaced with Pifeltro due to reduced kidney function.      Are there any concerns with adherence? No    Adherence counseling provided? Not needed    CLINICAL MANAGEMENT AND INTERVENTION      Clinical Benefit Assessment:    Do you feel the medicine is effective or helping your condition? Yes    HIV ASSOCIATED LABS:     Lab Results   Component Value Date/Time    HIVRS Detected (A) 03/12/2022 10:32 AM    HIVRS Detected (A) 09/23/2021 09:29 AM    HIVRS Not Detected 02/26/2021 10:18 AM    HIVRS Not Detected 08/15/2014 11:42 AM    HIVRS Not Detected 01/03/2014 10:46 AM    HIVRS Not Detected 11/23/2012 12:53 PM    HIVCP <20 (H) 03/12/2022 10:32 AM    HIVCP <20 (H) 09/23/2021 09:29 AM    HIVCP <40 01/02/2021 10:26 AM    HIVCP <40 (H) 09/04/2020 10:17 AM    HIVCP <40 05/09/2020 03:11 PM    HIVCP <40 05/16/2019 02:24 PM    ACD4 612 09/23/2021 09:29 AM    ACD4 720 02/26/2021 10:18 AM    ACD4 578 01/29/2021 10:39 AM    ACD4 514 08/01/2014 09:35 AM    ACD4 430 (L) 01/03/2014 10:46 AM    ACD4 549 11/23/2012 12:53 PM       Clinical Benefit counseling provided? Labs from 03/12/22 show evidence of clinical benefit  Adverse Effects Assessment:    Are you experiencing any side effects? No    Are you experiencing difficulty administering your medicine? No    Quality of Life Assessment:      How many days over the past month did your HIV  keep you from your normal activities? For example, brushing your teeth or getting up in the morning. 0    Have you discussed this with your provider? Not needed    Acute Infection Status:    Acute infections noted within Epic:  No active infections  Patient reported infection: None    Therapy Appropriateness:    Is therapy appropriate and patient progressing towards therapeutic goals? Yes, therapy is appropriate and should be continued    DISEASE/MEDICATION-SPECIFIC INFORMATION      N/A    HIV: Not Applicable    PATIENT SPECIFIC NEEDS     Does the patient have any physical, cognitive, or cultural barriers? No    Is the patient high risk? No    Did the patient require a clinical intervention? No    Does the patient require physician intervention or other additional services (i.e., nutrition, smoking cessation, social work)? No    SOCIAL DETERMINANTS OF HEALTH     At the [facility], we have learned that life circumstances - like trouble affording food, housing, utilities, or transportation can affect the health of many of our patients.   That is why we wanted to ask: are you currently experiencing any life circumstances that are negatively impacting your health and/or quality of life? Patient declined to answer    Social Determinants of Health     Financial Resource Strain: Not on file   Internet Connectivity: Not on file   Food Insecurity: Not on file   Tobacco Use: Medium Risk (03/26/2022)    Patient History     Smoking Tobacco Use: Former     Smokeless Tobacco Use: Never     Passive Exposure: Never   Housing/Utilities: Not on file   Alcohol Use: Not on file   Transportation Needs: Not on file   Substance Use: Not on file   Health Literacy: Not on file   Physical Activity: Not on file   Interpersonal Safety: Not on file   Stress: Not on file   Intimate Partner Violence: Not on file Depression: Not on file   Social Connections: Not on file       Would you be willing to receive help with any of the needs that you have identified today? Not applicable       SHIPPING     Specialty Medication(s) to be Shipped:   Infectious Disease: Tivicay    Other medication(s) to be shipped: No additional medications requested for fill at this time     Changes to insurance: No    Delivery Scheduled: Yes, Expected medication delivery date: 04/16/22.     Medication will be delivered via Same Day Courier to the confirmed prescription address in [facility].    The patient will receive a drug information handout for each medication shipped and additional FDA Medication Guides as required.  Verified that patient has previously received a Conservation officer, historic buildings and a Surveyor, mining.    The patient or caregiver noted above participated in the development of this care plan and knows that they can request review of or adjustments to the care plan at any time.      All of the patient's questions and concerns have been  addressed.    Roderic Palau, PharmD   [facility] Specialty Pharmacist

## 2022-04-16 MED FILL — TIVICAY 50 MG TABLET: ORAL | 30 days supply | Qty: 30 | Fill #2

## 2022-05-05 NOTE — Unmapped (Signed)
[facility] Shared [facility] Clinical Assessment & Refill Coordination Note    Samuel [name], DOB: [birth year]  Phone: There are no phone numbers on file.    All above HIPAA information was verified with patient.     Was a Nurse, learning disability used for this call? No    Specialty Medication(s):   Infectious Disease: Pifeltro and Tivicay     Current Outpatient Medications   Medication Sig Dispense Refill    ACCU-CHEK GUIDE L1-L2 CTRL SOL Soln       albuterol (PROVENTIL HFA;VENTOLIN HFA) 90 mcg/actuation inhaler Inhale 2 puffs. Take as needed      allopurinoL (ZYLOPRIM) 100 MG tablet Take 2.5 tablets (250 mg total) by mouth daily.      amLODIPine (NORVASC) 10 MG tablet Take 1 tablet (10 mg total) by mouth daily. 90 tablet 3    aspirin (ECOTRIN) 81 MG tablet daily.   0    atenoloL (TENORMIN) 100 MG tablet Take 1 tablet (100 mg total) by mouth daily. 90 tablet 3    atorvastatin (LIPITOR) 40 MG tablet Take 1 tablet (40 mg total) by mouth daily.      BACLOFEN, BULK, TOP Apply topically. Baclofen 2%, Diclofenac 5%, Gabapentin 6%, Tetracaine 3%    Take as needed      blood sugar diagnostic (ONETOUCH VERIO TEST STRIPS) Strp Use 2 (two) times daily E11.22      calcium carbonate-vitamin D2 500 mg(1,250mg ) -200 unit tablet Take 1 tablet (500 mg total) by mouth two (2) times a day.      carvediloL (COREG) 12.5 MG tablet 1 tablet (12.5 mg total) two (2) times a day.      cholecalciferol, vitamin D3 25 mcg, 1,000 units,, 1,000 unit (25 mcg) tablet Take by mouth daily.      cloNIDine HCL (CATAPRES) 0.1 MG tablet Take 1 tablet (0.1 mg total) by mouth 3 (three) times a day. 270 tablet 3    diazepam (VALIUM) 5 MG tablet Take 1 tablet (5 mg total) by mouth nightly.      dicyclomine (BENTYL) 20 mg tablet       dolutegravir (TIVICAY) 50 mg TABLET Take 1 tablet (50 mg total) by mouth daily. 30 tablet 11    doravirine 100 mg Tab Take 1 tablet (100 mg total) by mouth daily. 30 tablet 11    dorzolamide-timoloL (COSOPT) 22.3-6.8 mg/mL ophthalmic solution Administer 1 drop to both eyes two (2) times a day.      ferrous sulfate 325 (65 FE) MG tablet Take 1 tablet (325 mg total) by mouth in the morning.      folic acid (FOLVITE) 1 MG tablet   0    glimepiride (AMARYL) 4 MG tablet Take 1 tablet (4 mg total) by mouth daily. 90 tablet 3    HYDROcodone-acetaminophen (NORCO) 5-325 mg per tablet Directions are take one tablet 30 minutes before physical therapy, then take one tablet at bedtime as needed for pain      lisinopriL (PRINIVIL,ZESTRIL) 40 MG tablet Take 1 tablet (40 mg total) by mouth daily. 90 tablet 3    nitroglycerin (NITROSTAT) 0.4 MG SL tablet DISSOLVE ONE TABLET UNDER TONGUE AS NEEDED FOR CHEST PAIN EVERY 5 MINUTES AS DIRECTED 25 tablet 0    nystatin (MYCOSTATIN) 100,000 unit/mL suspension Take 5 mL (500,000 Units total) by mouth daily.      ondansetron (ZOFRAN) 4 MG tablet take 1 tablet by mouth every 8 hours if needed for nausea  oxyCODONE-acetaminophen (PERCOCET) 5-325 mg per tablet Take 1 tablet by mouth in the morning.      pregabalin (LYRICA) 25 MG capsule 1 capsule (25 mg total).      ROCKLATAN 0.02-0.005 % Drop INT 1 GTT INTO OU QHS      sertraline (ZOLOFT) 50 MG tablet take 1 tablet by mouth once daily      SITagliptin (JANUVIA) 50 MG tablet Take 1 tablet (50 mg total) by mouth daily. 90 tablet 3    spironolactone (ALDACTONE) 25 MG tablet Take 1 tablet (25 mg total) by mouth daily. 90 tablet 3    tamsulosin (FLOMAX) 0.4 mg capsule TAKE 1 CAPSULE(0.4 MG) BY MOUTH DAILY 90 capsule 3    timolol (TIMOPTIC) 0.5 % ophthalmic solution INSTILL 1 DROP IN BOTH EYES EVERY MORNING      torsemide (DEMADEX) 20 MG tablet Take 2 tablets (40 mg total) by mouth daily. 90 tablet 3     No current facility-administered medications for this visit.        Changes to medications: [name] reports no changes at this time.    Allergies   Allergen Reactions    Colchicine Analogues Diarrhea     Have diarrhea when taken for long periods of time Tramadol Other (See Comments)     unknown tolerates morphine       Changes to allergies: No    SPECIALTY MEDICATION ADHERENCE     Pifeltro 100 mg: 9 days of medicine on hand   Tivicay 50 mg: approximately 14 days of medicine on hand       Medication Adherence    Patient reported X missed doses in the last month: 0  Specialty Medication: Pifletro 100mg   Patient is on additional specialty medications: Yes  Additional Specialty Medications: Tivicay 50mg    Patient Reported Additional Medication X Missed Doses in the Last Month: 0  Patient is on more than two specialty medications: No  Demonstrates understanding of importance of adherence: yes  Informant: patient  Provider-estimated medication adherence level: good  Patient is at risk for Non-Adherence: No      Specialty medication(s) dose(s) confirmed: Regimen is correct and unchanged.     Are there any concerns with adherence? No    Adherence counseling provided? Not needed    CLINICAL MANAGEMENT AND INTERVENTION      Clinical Benefit Assessment:    Do you feel the medicine is effective or helping your condition? Yes    Clinical Benefit counseling provided? Not needed    Adverse Effects Assessment:    Are you experiencing any side effects?  When he started the Pifeltro, he had some problems with vivid dreaming but that has gotten better now.    Are you experiencing difficulty administering your medicine? No    Quality of Life Assessment:    How many days over the past month did your HIV  keep you from your normal activities? For example, brushing your teeth or getting up in the morning. 0    Have you discussed this with your provider? Not needed    Acute Infection Status:    Acute infections noted within Epic:  No active infections  Patient reported infection: None    Therapy Appropriateness:    Is therapy appropriate and patient progressing towards therapeutic goals? Yes, therapy is appropriate and should be continued    DISEASE/MEDICATION-SPECIFIC INFORMATION N/A    HIV: Not Applicable    PATIENT SPECIFIC NEEDS     Does the patient have any physical,  cognitive, or cultural barriers? No    Is the patient high risk? No    Did the patient require a clinical intervention? No    Does the patient require physician intervention or other additional services (i.e., nutrition, smoking cessation, social work)? No    SOCIAL DETERMINANTS OF HEALTH     At the [facility], we have learned that life circumstances - like trouble affording food, housing, utilities, or transportation can affect the health of many of our patients.   That is why we wanted to ask: are you currently experiencing any life circumstances that are negatively impacting your health and/or quality of life? Patient declined to answer    Social Determinants of Health     Financial Resource Strain: Not on file   Internet Connectivity: Not on file   Food Insecurity: Not on file   Tobacco Use: Medium Risk (03/26/2022)    Patient History     Smoking Tobacco Use: Former     Smokeless Tobacco Use: Never     Passive Exposure: Never   Housing/Utilities: Not on file   Alcohol Use: Not on file   Transportation Needs: Not on file   Substance Use: Not on file   Health Literacy: Not on file   Physical Activity: Not on file   Interpersonal Safety: Not on file   Stress: Not on file   Intimate Partner Violence: Not on file   Depression: Not on file   Social Connections: Not on file       Would you be willing to receive help with any of the needs that you have identified today? Not applicable       SHIPPING     Specialty Medication(s) to be Shipped:   Infectious Disease: Pifeltro and Tivicay    Other medication(s) to be shipped: No additional medications requested for fill at this time     Changes to insurance: No    Delivery Scheduled: Yes, Expected medication delivery date: 05/11/22.     Medication will be delivered via Same Day Courier to the confirmed prescription address in [facility]    The patient will receive a drug information handout for each medication shipped and additional FDA Medication Guides as required.  Verified that patient has previously received a Conservation officer, historic buildings and a Surveyor, mining.    The patient or caregiver noted above participated in the development of this care plan and knows that they can request review of or adjustments to the care plan at any time.      All of the patient's questions and concerns have been addressed.    Roderic Palau, PharmD   [facility] Shared [facility] Specialty Pharmacist

## 2022-05-11 MED FILL — TIVICAY 50 MG TABLET: ORAL | 30 days supply | Qty: 30 | Fill #3

## 2022-05-11 MED FILL — PIFELTRO 100 MG TABLET: ORAL | 30 days supply | Qty: 30 | Fill #1

## 2022-06-08 DIAGNOSIS — E1122 Type 2 diabetes mellitus with diabetic chronic kidney disease: Principal | ICD-10-CM

## 2022-06-08 DIAGNOSIS — N184 Chronic kidney disease, stage 4 (severe): Principal | ICD-10-CM

## 2022-06-10 NOTE — Unmapped (Signed)
[facility] Refill Coordination Note    Specialty Medication(s) to be Shipped:   Infectious Disease: Pifeltro and Tivicay    Other medication(s) to be shipped: No additional medications requested for fill at this time     Samuel [name], DOB: Oct 17, 1950  Phone: There are no phone numbers on file.      All above HIPAA information was verified with patient.     Was a Nurse, learning disability used for this call? No    Completed refill call assessment today to schedule patient's medication shipment from the [facility] [phone]).  All relevant notes have been reviewed.     Specialty medication(s) and dose(s) confirmed: Regimen is correct and unchanged.   Changes to medications: Samuel [name] reports no changes at this time.  Changes to insurance: No  New side effects reported not previously addressed with a pharmacist or physician: None reported  Questions for the pharmacist: No    Confirmed patient received a Conservation officer, historic buildings and a Surveyor, mining with first shipment. The patient will receive a drug information handout for each medication shipped and additional FDA Medication Guides as required.       DISEASE/MEDICATION-SPECIFIC INFORMATION        N/A    SPECIALTY MEDICATION ADHERENCE     Medication Adherence    Specialty Medication: pifeltro 100mg   Patient is on additional specialty medications: Yes  Additional Specialty Medications: tivicay 50mg                                 Were doses missed due to medication being on hold? No    PIFELTRO 100 mg Tab (doravirine)  : 3 days of medicine on hand   TIVICAY 50 mg TABLET (dolutegravir)  : 3 days of medicine on hand       REFERRAL TO PHARMACIST     Referral to the pharmacist: Not needed      [facility]     Shipping address confirmed in Epic.     Delivery Scheduled: Yes, Expected medication delivery date: 06/12/22.     Medication will be delivered via Same Day Courier to the prescription address in Epic [facility].    [facility] Specialty Technician

## 2022-06-12 MED FILL — PIFELTRO 100 MG TABLET: ORAL | 30 days supply | Qty: 30 | Fill #2

## 2022-06-12 MED FILL — TIVICAY 50 MG TABLET: ORAL | 30 days supply | Qty: 30 | Fill #4

## 2022-06-18 NOTE — Unmapped (Signed)
HISTORY OF PRESENT ILLNESS:      [name] is a 72 year old man with stage G4:A2 chronic kidney disease who is evaluated today for stage G4:A2 chronic kidney disease.  Although his office blood pressure is somewhat elevated today, his home readings are averaging 132/78 mmHg.  His leg edema is stable.  He continues to have symptoms of diabetic neuropathy.  Dr. Marcello Fennel recently changed gabapentin to pregabalin and the patient has had some benefit with regard to his neuropathy symptoms.  He has not had chest pain, increased leg edema, or orthopnea.  He continues to walk on a daily basis for at least 30 minutes.  He is joined a walking group.  He still needs to complete a colonoscopy as part of his transplant evaluation.  His eGFR continues to run in the 25 mL/min range.  He has not had symptomatic hypoglycemia.  We talked about potential benefits of GLP-1 agonist therapy.  He is going to discuss this with Dr. Carola Frost.  This may permit tapering of the glimepiride and will require discontinuation of sitagliptin.    MEDICATIONS:    Current Outpatient Medications   Medication Sig Dispense Refill    ACCU-CHEK GUIDE L1-L2 CTRL SOL Soln       albuterol (PROVENTIL HFA;VENTOLIN HFA) 90 mcg/actuation inhaler Inhale 2 puffs. Take as needed      allopurinoL (ZYLOPRIM) 100 MG tablet Take 2.5 tablets (250 mg total) by mouth daily.      amLODIPine (NORVASC) 10 MG tablet Take 1 tablet (10 mg total) by mouth daily. 90 tablet 3    aspirin (ECOTRIN) 81 MG tablet daily.   0    atenoloL (TENORMIN) 100 MG tablet Take 1 tablet (100 mg total) by mouth daily. 90 tablet 3    atorvastatin (LIPITOR) 40 MG tablet Take 1 tablet (40 mg total) by mouth daily.      BACLOFEN, BULK, TOP Apply topically. Baclofen 2%, Diclofenac 5%, Gabapentin 6%, Tetracaine 3%    Take as needed      blood sugar diagnostic (ONETOUCH VERIO TEST STRIPS) Strp Use 2 (two) times daily E11.22      calcium carbonate-vitamin D2 500 mg(1,250mg ) -200 unit tablet Take 1 tablet (500 mg total) by mouth two (2) times a day.      carvediloL (COREG) 12.5 MG tablet 1 tablet (12.5 mg total) two (2) times a day.      cholecalciferol, vitamin D3 25 mcg, 1,000 units,, 1,000 unit (25 mcg) tablet Take by mouth daily.      cloNIDine HCL (CATAPRES) 0.1 MG tablet Take 1 tablet (0.1 mg total) by mouth 3 (three) times a day. 270 tablet 3    diazepam (VALIUM) 5 MG tablet Take 1 tablet (5 mg total) by mouth nightly.      dicyclomine (BENTYL) 20 mg tablet       dolutegravir (TIVICAY) 50 mg TABLET Take 1 tablet (50 mg total) by mouth daily. 30 tablet 11    doravirine 100 mg Tab Take 1 tablet (100 mg total) by mouth daily. 30 tablet 11    dorzolamide-timoloL (COSOPT) 22.3-6.8 mg/mL ophthalmic solution Administer 1 drop to both eyes two (2) times a day.      ferrous sulfate 325 (65 FE) MG tablet Take 1 tablet (325 mg total) by mouth in the morning.      folic acid (FOLVITE) 1 MG tablet   0    glimepiride (AMARYL) 4 MG tablet Take 1 tablet (4 mg total) by mouth daily. 90 tablet 3  HYDROcodone-acetaminophen (NORCO) 5-325 mg per tablet Directions are take one tablet 30 minutes before physical therapy, then take one tablet at bedtime as needed for pain      lisinopriL (PRINIVIL,ZESTRIL) 40 MG tablet Take 1 tablet (40 mg total) by mouth daily. 90 tablet 3    nitroglycerin (NITROSTAT) 0.4 MG SL tablet DISSOLVE ONE TABLET UNDER TONGUE AS NEEDED FOR CHEST PAIN EVERY 5 MINUTES AS DIRECTED 25 tablet 0    nystatin (MYCOSTATIN) 100,000 unit/mL suspension Take 5 mL (500,000 Units total) by mouth daily.      ondansetron (ZOFRAN) 4 MG tablet take 1 tablet by mouth every 8 hours if needed for nausea      oxyCODONE-acetaminophen (PERCOCET) 5-325 mg per tablet Take 1 tablet by mouth in the morning.      pregabalin (LYRICA) 25 MG capsule 1 capsule (25 mg total).      ROCKLATAN 0.02-0.005 % Drop INT 1 GTT INTO OU QHS      sertraline (ZOLOFT) 50 MG tablet take 1 tablet by mouth once daily      SITagliptin (JANUVIA) 50 MG tablet Take 1 tablet (50 mg total) by mouth daily. 90 tablet 3    spironolactone (ALDACTONE) 25 MG tablet Take 1 tablet (25 mg total) by mouth daily. 90 tablet 3    tamsulosin (FLOMAX) 0.4 mg capsule TAKE 1 CAPSULE(0.4 MG) BY MOUTH DAILY 90 capsule 3    timolol (TIMOPTIC) 0.5 % ophthalmic solution INSTILL 1 DROP IN BOTH EYES EVERY MORNING      torsemide (DEMADEX) 20 MG tablet Take 2 tablets (40 mg total) by mouth daily. 90 tablet 3     No current facility-administered medications for this visit.       REVIEW OF SYSTEMS:  Constitutional: No constitutional symptoms.  Cardiovascular: No chest pain.  Pulmonary: No cough or hemoptysis.  All other systems are reviewed and are negative except that noted in the history of present illness.    PHYSICAL EXAMINATION:      Constitutional: He is in no acute distress.  Vital signs: BP 169/99 (BP Site: L Arm, BP Position: Sitting, BP Cuff Size: Large)  - Pulse 62  - Temp 36.7 ??C (98 ??F) (Temporal)  - Ht 177.8 cm (5' 10)  - Wt 99.8 kg (220 lb)  - BMI 31.57 kg/m??   Wt Readings from Last 3 Encounters:   06/26/22 99.8 kg (220 lb)   03/26/22 100.2 kg (221 lb)   03/12/22 99.3 kg (219 lb)   PTHomeBP   HEENT: No oropharyngeal lesions.Neck: Supple without lymphadenopathy or thyromegaly. No carotid bruits.   Lungs: Clear to auscultation   Heart: Regular rate and rhythm. Normal S1-S2.   Abdomen: Slightly protuberant.  Soft, nontender, normoactive bowel sounds.  Liver span is about 9 cm to percussion.  Neurologic: Alert and oriented x 3.  Strength is normal.  DTRs are 2+ and symmetric.  There are no lateralizing sensory deficits.  Extremities: Trace pretibial edema.  Dorsalis pedis and posterior tibial pulses are palpable.    Node survey: No lymphadenopathy.   Musculoskeletal: No active synovitis.   Skin/Nails:  No rash or nail changes.      LABORATORY STUDIES:    Recent Results (from the past 168 hour(s))   Albumin/creatinine urine ratio    Collection Time: 06/26/22  9:12 AM   Result Value Ref Range Creat U 25.3 Undefined mg/dL    Albumin Quantitative, Urine 1.6 Undefined mg/dL    Albumin/Creatinine Ratio 63.2 (H) 0.0 - 30.0 ug/mg  Comprehensive Metabolic Panel    Collection Time: 06/26/22  9:25 AM   Result Value Ref Range    Sodium 140 135 - 145 mmol/L    Potassium 4.9 (H) 3.4 - 4.8 mmol/L    Chloride 105 98 - 107 mmol/L    CO2 28.3 20.0 - 31.0 mmol/L    Anion Gap 7 5 - 14 mmol/L    BUN 16 9 - 23 mg/dL    Creatinine 1.61 (H) 0.73 - 1.18 mg/dL    BUN/Creatinine Ratio 7     eGFR CKD-EPI (2021) Male 30 (L) >=60 mL/min/1.61m2    Glucose 163 70 - 179 mg/dL    Calcium 09.6 8.7 - 04.5 mg/dL    Albumin 4.3 3.4 - 5.0 g/dL    Total Protein 7.8 5.7 - 8.2 g/dL    Total Bilirubin 0.7 0.3 - 1.2 mg/dL    AST 57 (H) <=40 U/L    ALT 81 (H) 10 - 49 U/L    Alkaline Phosphatase 93 46 - 116 U/L   Phosphorus Level    Collection Time: 06/26/22  9:25 AM   Result Value Ref Range    Phosphorus 3.4 2.4 - 5.1 mg/dL   Magnesium Level    Collection Time: 06/26/22  9:25 AM   Result Value Ref Range    Magnesium 2.3 1.6 - 2.6 mg/dL   CBC w/ Differential    Collection Time: 06/26/22  9:25 AM   Result Value Ref Range    WBC 6.9 3.6 - 11.2 10*9/L    RBC 4.57 4.26 - 5.60 10*12/L    HGB 14.4 12.9 - 16.5 g/dL    HCT 98.1 19.1 - 47.8 %    MCV 93.6 77.6 - 95.7 fL    MCH 31.6 25.9 - 32.4 pg    MCHC 33.7 32.0 - 36.0 g/dL    RDW 29.5 62.1 - 30.8 %    MPV 9.1 6.8 - 10.7 fL    Platelet 103 (L) 150 - 450 10*9/L    Neutrophils % 65.7 %    Lymphocytes % 23.4 %    Monocytes % 9.1 %    Eosinophils % 1.3 %    Basophils % 0.5 %    Absolute Neutrophils 4.6 1.8 - 7.8 10*9/L    Absolute Lymphocytes 1.6 1.1 - 3.6 10*9/L    Absolute Monocytes 0.6 0.3 - 0.8 10*9/L    Absolute Eosinophils 0.1 0.0 - 0.5 10*9/L    Absolute Basophils 0.0 0.0 - 0.1 10*9/L   PTH    Collection Time: 06/26/22  9:25 AM   Result Value Ref Range    PTH 94.3 (H) 18.4 - 80.1 pg/mL       03/24/2022    Sodium 142 potassium 4.7 chloride 107 total CO2 27 BUN 31 creatinine 3.0 eGFR 25 glucose 109 calcium 10.5 AST 75 ALT 111 alk phos 77 albumin 4.7 bilirubin 0.7 total protein 7.6    White count 11.6 hemoglobin 14.8 platelets 145    Cholesterol 86 triglycerides 85 HDL cholesterol 33 LDL 36    Urine albumin to creatinine ratio 121 mg/g    Hemoglobin A1c 5.6    TSH 0.311    Serum creatinine trend:    Date Value   03/24/2022 3.00 (H)   03/12/2022 2.62 (H)   09/23/2021 3.26 (H)   06/06/2021 2.81 (H)   02/26/2021 2.18 (H)   01/29/2021 2.63 (H)   01/02/2021 2.62 (H)   11/08/2020 2.70 (H)     Imaging:  Chest CT 06/24/2021    Chest CT shows stability of the right pulmonary nodule.  However, it showed increased bilateral lower lobe groundglass and reticular abnormality may represent changes of infectious or organizing pneumonia (including autoimmune or medication-related etiologies).      IMPRESSION AND PLAN:    1.  Stage G4/A2 chronic kidney disease.  He continues to have relatively stable kidney function.  He has a 25% risk of progression to end-stage kidney disease at 5 years.  His transplant evaluation is ongoing.  He favors hemodialysis over peritoneal dialysis.  Because of the stability of kidney function, we will continue to hold off on pursuing vascular access at this time.  Once his GFR is consistently less than about 18-20 mill per minute, he will be referred for fistula creation.  In the interim, he knows to save veins in the forearm.  He continues to require ongoing close monitoring because of multiple medications including HAART therapy in the setting of chronic kidney disease.  I will plan to see him back again in about 3 months time.     2.  Hypertension.  Home blood pressures are at goal on a multidrug regimen that consists of amlodipine 10 mg daily, lisinopril 40 mg daily, hydralazine 100 mg twice daily, atenolol 100 mg once daily, doxazosin 4 mg daily, torsemide 40 mg daily, and spironolactone 25 mg daily.    3.  Human immunodeficiency virus disease.  He has ongoing follow-up with Dr. Greggory Stallion.  He will continue on dolategravir 50 mg daily and doravirine 100 mg daily.  He previously had a very strong relationship with Dr. Tomasa Hose.  I informed him of her recent untimely death.    4.  Lower urinary tract symptoms.  His symptoms are well controlled on tamsulosin.    5.  Secondary/tertiary hyperparathyroidism.  Will check serum PTH level today.    6.  Type 2 diabetes mellitus.  He is at increased cardiovascular risk.  Therefore, consideration should be given for dulaglutide or semaglutide therapy.  If he does start a GLP-1 agonist, the DPP4 inhibitor (Sitagliptin) would require discontinuation.  A GLP-1 agonist would not only mitigate renal risk, but would decrease mortality and cardiovascular risk.    7.  4 mm RLL pulmonary nodule and groundglass infiltrates on chest x-ray.  His pulmonary symptoms are stable.    8.  Metabolic dysfunction associated with fatty liver disease.  Continue efforts to control cardiovascular and metabolic risk including statin therapy, consideration of GLP-1 agonist therapy and lifestyle modification including daily exercise with a target of at least 150 minutes/week.    9.  Chronic low back pain.  He will continue to follow-up with his orthopedist.    10. Peripheral neuropathy.  Continue pregabalin.    11. Follow up plans.  He was given the number to schedule the colonoscopy to help facilitate his transplant evaluation.  I will see him back again in about 3 months.    Zetta Bills. Stefano Gaul, MD  Date of service 06/26/2022

## 2022-06-26 ENCOUNTER — Ambulatory Visit: Admit: 2022-06-26 | Discharge: 2022-06-27 | Payer: MEDICARE | Attending: Nephrology | Primary: Nephrology

## 2022-06-26 DIAGNOSIS — I151 Hypertension secondary to other renal disorders: Principal | ICD-10-CM

## 2022-06-26 DIAGNOSIS — E1140 Type 2 diabetes mellitus with diabetic neuropathy, unspecified: Principal | ICD-10-CM

## 2022-06-26 DIAGNOSIS — N2581 Secondary hyperparathyroidism of renal origin: Principal | ICD-10-CM

## 2022-06-26 DIAGNOSIS — B20 Human immunodeficiency virus [HIV] disease: Principal | ICD-10-CM

## 2022-06-26 DIAGNOSIS — E669 Obesity, unspecified: Principal | ICD-10-CM

## 2022-06-26 DIAGNOSIS — R7989 Other specified abnormal findings of blood chemistry: Principal | ICD-10-CM

## 2022-06-26 DIAGNOSIS — F3340 Major depressive disorder, recurrent, in remission, unspecified: Principal | ICD-10-CM

## 2022-06-26 DIAGNOSIS — N184 Chronic kidney disease, stage 4 (severe): Principal | ICD-10-CM

## 2022-06-26 DIAGNOSIS — K7581 Nonalcoholic steatohepatitis (NASH): Principal | ICD-10-CM

## 2022-06-26 LAB — COMPREHENSIVE METABOLIC PANEL
ALBUMIN: 4.3 g/dL (ref 3.4–5.0)
ALKALINE PHOSPHATASE: 93 U/L (ref 46–116)
ALT (SGPT): 81 U/L — ABNORMAL HIGH (ref 10–49)
ANION GAP: 7 mmol/L (ref 5–14)
AST (SGOT): 57 U/L — ABNORMAL HIGH (ref <=34)
BILIRUBIN TOTAL: 0.7 mg/dL (ref 0.3–1.2)
BLOOD UREA NITROGEN: 16 mg/dL (ref 9–23)
BUN / CREAT RATIO: 7
CALCIUM: 10 mg/dL (ref 8.7–10.4)
CHLORIDE: 105 mmol/L (ref 98–107)
CO2: 28.3 mmol/L (ref 20.0–31.0)
CREATININE: 2.27 mg/dL — ABNORMAL HIGH
EGFR CKD-EPI (2021) MALE: 30 mL/min/{1.73_m2} — ABNORMAL LOW (ref >=60)
GLUCOSE RANDOM: 163 mg/dL (ref 70–179)
POTASSIUM: 4.9 mmol/L — ABNORMAL HIGH (ref 3.4–4.8)
PROTEIN TOTAL: 7.8 g/dL (ref 5.7–8.2)
SODIUM: 140 mmol/L (ref 135–145)

## 2022-06-26 LAB — CBC W/ AUTO DIFF
BASOPHILS ABSOLUTE COUNT: 0 10*9/L (ref 0.0–0.1)
BASOPHILS RELATIVE PERCENT: 0.5 %
EOSINOPHILS ABSOLUTE COUNT: 0.1 10*9/L (ref 0.0–0.5)
EOSINOPHILS RELATIVE PERCENT: 1.3 %
HEMATOCRIT: 42.8 % (ref 39.0–48.0)
HEMOGLOBIN: 14.4 g/dL (ref 12.9–16.5)
LYMPHOCYTES ABSOLUTE COUNT: 1.6 10*9/L (ref 1.1–3.6)
LYMPHOCYTES RELATIVE PERCENT: 23.4 %
MEAN CORPUSCULAR HEMOGLOBIN CONC: 33.7 g/dL (ref 32.0–36.0)
MEAN CORPUSCULAR HEMOGLOBIN: 31.6 pg (ref 25.9–32.4)
MEAN CORPUSCULAR VOLUME: 93.6 fL (ref 77.6–95.7)
MEAN PLATELET VOLUME: 9.1 fL (ref 6.8–10.7)
MONOCYTES ABSOLUTE COUNT: 0.6 10*9/L (ref 0.3–0.8)
MONOCYTES RELATIVE PERCENT: 9.1 %
NEUTROPHILS ABSOLUTE COUNT: 4.6 10*9/L (ref 1.8–7.8)
NEUTROPHILS RELATIVE PERCENT: 65.7 %
PLATELET COUNT: 103 10*9/L — ABNORMAL LOW (ref 150–450)
RED BLOOD CELL COUNT: 4.57 10*12/L (ref 4.26–5.60)
RED CELL DISTRIBUTION WIDTH: 13 % (ref 12.2–15.2)
WBC ADJUSTED: 6.9 10*9/L (ref 3.6–11.2)

## 2022-06-26 LAB — ALBUMIN / CREATININE URINE RATIO
ALBUMIN QUANT URINE: 1.6 mg/dL
ALBUMIN/CREATININE RATIO: 63.2 ug/mg — ABNORMAL HIGH (ref 0.0–30.0)
CREATININE, URINE: 25.3 mg/dL

## 2022-06-26 LAB — PHOSPHORUS: PHOSPHORUS: 3.4 mg/dL (ref 2.4–5.1)

## 2022-06-26 LAB — MAGNESIUM: MAGNESIUM: 2.3 mg/dL (ref 1.6–2.6)

## 2022-06-26 LAB — PARATHYROID HORMONE (PTH): PARATHYROID HORMONE INTACT: 94.3 pg/mL — ABNORMAL HIGH (ref 18.4–80.1)

## 2022-06-26 NOTE — Unmapped (Addendum)
Colonoscopy [phone] to schedule  Labs today-I will call you with results  Return in 3  months

## 2022-07-03 ENCOUNTER — Ambulatory Visit: Admit: 2022-07-03 | Discharge: 2022-07-04 | Payer: MEDICARE

## 2022-07-03 DIAGNOSIS — I5030 Unspecified diastolic (congestive) heart failure: Principal | ICD-10-CM

## 2022-07-03 DIAGNOSIS — I2510 Atherosclerotic heart disease of native coronary artery without angina pectoris: Principal | ICD-10-CM

## 2022-07-03 MED ORDER — NITROGLYCERIN 0.4 MG SUBLINGUAL TABLET
ORAL_TABLET | SUBLINGUAL | 0 refills | 1 days | Status: CP | PRN
Start: 2022-07-03 — End: ?

## 2022-07-03 NOTE — Unmapped (Addendum)
Samuel [name],   It was a pleasure to see you in clinic today. Please make sure you are using your medications as prescribed.

## 2022-07-03 NOTE — Unmapped (Addendum)
DIVISION OF CARDIOLOGY  [facility] [place]        Date of Service: 07/03/2022    Follow-up Patient Clinic Note    PCP: Referring Provider:   Barbette Reichmann, MD  [address] [place] [address]  Phone: [phone]  Fax: [phone] Barbette Reichmann, MD  [address]  [place],  [place] [address]  Phone: [phone]  Fax: [phone]       Assessment and Plan:     CAD  NSTEMI (11/12/15) without intervention  Coronary angiography 11/13/15 showed diffuse disease including 90% large OM without any intervention, medical therapy pursed at that time. No significant ischemic signs or symptoms since that time. PET stress in 2021 was normal. Patient continues to report occasional sharp reproducible chest pain that is improved with NTG (though he ran out recently).   - Continue carvedilol (COREG) 12.5mg  BID, Aspirin 81mg , and Atorvastatin 20mg   - PRN SL NTG reordered    HFpEF  Patient appears euvolemic on exam today.   - Continue torsemide 40mg  daily and Spironolactone 25mg  daily    Resistant HTN  Patient reports home SBP range 150-160s. Currently managed by Nephrologist.  - Continue amlodipine 10 mg daily, lisinopril 40 mg daily, hydralazine 100 mg BID, atenolol 100 mg daily, doxazosin 4 mg daily, torsemide 40 mg daily, and spironolactone 25 mg daily     CKD, Stage G4:A2  Currently follows with Nephrology.  Per recent clinic note, patient has  relatively stable kidney function. He has a 25% risk of progression to end-stage kidney disease at 5 years. Transplant evaluation is ongoing.   - Continue follow-up by nephrology     Human immunodeficiency virus (HIV) disease:  - Continue to follow with ID    Aortic Sclerosis:  - Systolic murmur c/w prior.  Moderately thickened leaflets with normal excursion. On 09/2019 echo.       Samuel [name] will return in 6 month or sooner as needed.     The patient was seen with and evaluated by Dr. Willa Rough.      Subjective:     Chief Complaint:  Fatigue, lack of energy, dyspnea    History of Present Illness:   Samuel [name] is a 72 y.o. male with a history of CAD with recent NSTEMI 10/2015, HIV (diagnosed 12/1999), CKD, Chronic HCV, gout, nephrotic syndrome, and HTN who is presents for evaluation of worsening fatigue, decreased energy and dyspnea.      Of note, patient was admitted on 11/12/2015 to the [facility] with typical substernal chest pain described as pressure brought on by exertion, with shortness breath and diaphoresis with radiation to left arm. Labs were notable for elevated troponin, peak at 0.585 downtrending 0.433, and EKG showing T-wave inversions in the lateral leads. Echocardiography showed normal LVEF, diastolic dysfunction and moderate to severe LVH. Patient received LHC on 6/14 that showed coronary artery disease including: 40% mid LAD at the D1 bifurcation area, 30-40% mid/distal LAD, 95% stenosis in the large branching OM, 80-90% stenoses in both distal branches of the large OM, diffuse mild disease in the RCA. Decision was made to medically manage patient is discharged on DAPT with aspirin and clopidogrel in addition to Imdur 30 daily for chest pain. Patient was last seen almost 2 years ago and has canceled / rescheduled multiple follow up appointments since that time.      Interval history:  Patient reports fatigue and feeling  winded with minimal activity,  decreased energy level, abrupt/random onset of dyspnea at rest for about 1 month.  He notes that he usually starts to feel very tired after doing his daily work in the morning and usually requires a midday nap daily to recover.  His wife states that his naps takes several hours and most often she wakes him up from sleep.  She reports that patient snores very loudly and she has witnessed several episodes of apnea while he sleeps.  He also reports ongoing occasional sharp left chest pain; he notes that he has used nitroglycerin about 3-4 times this past year.  He also reports that his lower extremity edema has significantly improved since interval increase of his torsemide in addition of metolazone by nephrology; he notes that torsemide dose has since been decreased to 20 mg daily in setting of worsening creatinine.  He continues to wear his compression stockings. Otherwise he denied orthopnea or PND dyspnea on exertion, presyncope or syncope.    He continues to follow with ID for his HIV. Home regimen of dolutegravir 50mg  and emtricitabine-tenofovir 200-25mg . He was also recently seen by nephrology who who states that his GFR is now in range for transplant candidacy.    Cardiovascular History & Procedures:  Cardiovascular Problems:  CAD with NSTEMI 11/12/2015  HTN    Risk Factors:  advanced age (older than 82 for men, 52 for women), hypertension and male gender    Cath / PCI:  Coronary Angiography 11/13/2015:   1. Coronary artery disease including:  a) 40% mid LAD at the D1 bifurcation area  b) 30-40% mid/distal LAD  c) 95% stenosis in the large branching OM  d) 80-90% stenoses in both distal branches of the large OM  e) Diffuse mild disease in the RCA    2. Elevated left heart filling pressure (LVEDP = 20 mmHg)   3. No intervention was made.    CV Surgery:   None    EP Procedures and Devices:  None    Non-Invasive Evaluation(s):  TTE 10/10/2019  1. The left ventricle is normal in size with mildly increased wall thickness.    2. The left ventricular systolic function is normal, LVEF is visually estimated at 60-65%.    3. There is grade I diastolic dysfunction (impaired relaxation).    4. The right ventricle is normal in size, with normal systolic function.    5. The mitral valve leaflets are mildly thickened with normal leaflet mobility.    6. The aortic valve is trileaflet with moderately thickened leaflets with normal excursion.    7. IVC size and inspiratory change suggest mildly elevated right atrial pressure. (5-10 mmHg).    TTE 11/13/2015  Left ventricular hypertrophy - moderate  Normal left ventricular systolic function, ejection fraction > 55%  Dilated left atrium - mild  Aortic sclerosis  Normal right ventricular systolic function      Medical History:  Past Medical History:   Diagnosis Date    Abnormal EKG 11/09/2015    Acute encephalopathy 11/30/2016    Anemia in stage 3 chronic kidney disease (CMS-HCC) 01/19/2015    Last Assessment & Plan: Formatting of this note might be different from the original. # Severe iron deficiency anemia/hemoglobin- 6/ ferritin 5 [march 2017]. Status post IV iron- significant improvement of hemoglobin. Last IV Feraheme was 08/28/2015 where he received 510 mg. #Was seen by Dr. Donneta Romberg in February 2019 with complaints of worsening significant pica/fatigue/cold intolerance.  Started    Bradycardia 11/09/2015    Chronic hepatitis  C without hepatic coma (CMS-HCC) 01/19/2015    CKD (chronic kidney disease) stage 3, GFR 30-59 ml/min (CMS-HCC) 01/08/2015    Coronary artery disease 10/2015    Multivessel disease, plan medical management after cath 6/17    GERD (gastroesophageal reflux disease)     Gout     HCV (hepatitis C virus) 11/22/2012    (genotype 1, HCV RNA =311,000; liver biopsy 05/2005 =chronic hepatitis grade II stage II).  Has consistently and repeatedly refused therapy.       HIV (human immunodeficiency virus infection) (CMS-HCC)     Undectable viral load 5/17    Hypertension     Nephrolithiasis     Nephrotic syndrome 03/05/2014    Neurological deficit present 11/13/2015    Weakness in lower extremities    Personal history of gout 10/27/2018    Renal mass     Followed by neprhology, MRI 8/16 improved, felt to be benign       Surgical History:  Past Surgical History:   Procedure Laterality Date    BACK SURGERY      2012    PR CATH PLACE/CORON ANGIO, IMG SUPER/INTERP,W LEFT HEART VENTRICULOGRAPHY N/A 11/13/2015    Procedure: Left Heart Catheterization;  Surgeon: Orpha Bur, MD;  Location: [facility] CATH;  Service: Cardiology PR REMOVAL OF ANAL FISSURE N/A 06/29/2014    Procedure: FISSURECTOMY, INCLUDING SPHINCTEROTOMY, WHEN PERFORMED;  Surgeon: Romero Belling, MD;  Location: [facility];  Service: Gastrointestinal    PR SURG DIAGNOSTIC EXAM, ANORECTAL N/A 06/29/2014    Procedure: ANORECTAL EXAM, SURGICAL, REQUIRING ANESTHESIA (GENERAL, SPINAL, OR EPIDURAL), DIAGNOSTIC;  Surgeon: Romero Belling, MD;  Location: [facility] OR [facility];  Service: Gastrointestinal       Social History:   reports that he quit smoking about 30 years ago. His smoking use included cigarettes. He started smoking about 40 years ago. He has a 10.0 pack-year smoking history. He has never been exposed to tobacco smoke. He has never used smokeless tobacco. He reports that he does not drink alcohol and does not use drugs.      Family History:  family history includes Cancer in his father and mother; Diabetes in his brother; Heart disease (age of onset: 5) in his sister; Hypertension in his mother and sister; Kidney disease in his father; Kidney failure (age of onset: 11) in his sister; Stroke in his brother; Thyroid disease in his mother.      Review of Systems:   Except as noted in the HPI, the remainder of 10 systems reviewed is negative.     Allergies:  Allergies   Allergen Reactions    Colchicine Analogues Diarrhea     Have diarrhea when taken for long periods of time    Tramadol Other (See Comments)     unknown tolerates morphine       Medications:   Prior to Admission medications    Medication Dose, Route, Frequency   ACCU-CHEK GUIDE L1-L2 CTRL SOL Soln    albuterol (PROVENTIL HFA;VENTOLIN HFA) 90 mcg/actuation inhaler 2 puffs, Inhalation, Take as needed   allopurinoL (ZYLOPRIM) 100 MG tablet 250 mg, Oral, Daily (standard)   aspirin (ECOTRIN) 81 MG tablet Daily (standard)   atorvastatin (LIPITOR) 40 MG tablet 40 mg, Oral, Daily (standard)   blood sugar diagnostic (ONETOUCH VERIO TEST STRIPS) Strp Use 2 (two) times daily E11.22   calcium carbonate-vitamin D2 500 mg(1,250mg ) -200 unit tablet 1 tablet, Oral, 2 times a day (standard)   carvediloL (COREG) 12.5 MG tablet 12.5 mg, 2  times a day (standard)   cholecalciferol, vitamin D3 25 mcg, 1,000 units,, 1,000 unit (25 mcg) tablet Oral, Daily (standard)   diazepam (VALIUM) 5 MG tablet 5 mg, Oral, Nightly   dicyclomine (BENTYL) 20 mg tablet    dolutegravir (TIVICAY) 50 mg TABLET 50 mg, Oral, Daily (standard)   doravirine 100 mg Tab 1 tablet, Oral, Daily (standard)   dorzolamide-timoloL (COSOPT) 22.3-6.8 mg/mL ophthalmic solution 1 drop, Both Eyes, 2 times a day (standard)   ferrous sulfate 325 (65 FE) MG tablet 325 mg, Oral, Daily   folic acid (FOLVITE) 1 MG tablet No dose, route, or frequency recorded.   HYDROcodone-acetaminophen (NORCO) 5-325 mg per tablet Directions are take one tablet 30 minutes before physical therapy, then take one tablet at bedtime as needed for pain   nitroglycerin (NITROSTAT) 0.4 MG SL tablet DISSOLVE ONE TABLET UNDER TONGUE AS NEEDED FOR CHEST PAIN EVERY 5 MINUTES AS DIRECTED   oxyCODONE-acetaminophen (PERCOCET) 5-325 mg per tablet 1 tablet, Oral, Daily   pregabalin (LYRICA) 25 MG capsule 25 mg   ROCKLATAN 0.02-0.005 % Drop INT 1 GTT INTO OU QHS   sertraline (ZOLOFT) 50 MG tablet take 1 tablet by mouth once daily   timolol (TIMOPTIC) 0.5 % ophthalmic solution INSTILL 1 DROP IN BOTH EYES EVERY MORNING   amLODIPine (NORVASC) 10 MG tablet 10 mg, Oral, Daily (standard)   atenoloL (TENORMIN) 100 MG tablet 100 mg, Oral, Daily (standard)   BACLOFEN, BULK, TOP Apply topically. Baclofen 2%, Diclofenac 5%, Gabapentin 6%, Tetracaine 3%    Take as needed  Patient not taking: Reported on 07/03/2022   cloNIDine HCL (CATAPRES) 0.1 MG tablet 0.1 mg, Oral, 3 times daily (RT)   glimepiride (AMARYL) 4 MG tablet 4 mg, Oral, Daily   lisinopriL (PRINIVIL,ZESTRIL) 40 MG tablet 40 mg, Oral, Daily (standard)   nystatin (MYCOSTATIN) 100,000 unit/mL suspension 5 mL, Daily (standard)  Patient not taking: Reported on 07/03/2022   ondansetron (ZOFRAN) 4 MG tablet take 1 tablet by mouth every 8 hours if needed for nausea  Patient not taking: Reported on 07/03/2022   SITagliptin (JANUVIA) 50 MG tablet 50 mg, Oral, Daily (standard)   spironolactone (ALDACTONE) 25 MG tablet 25 mg, Oral, Daily (standard)   tamsulosin (FLOMAX) 0.4 mg capsule TAKE 1 CAPSULE(0.4 MG) BY MOUTH DAILY  Patient not taking: Reported on 07/03/2022   torsemide (DEMADEX) 20 MG tablet 40 mg, Oral, Daily (standard)         Objective:     Vitals  BP 160/88 (BP Site: L Arm, BP Position: Sitting, BP Cuff Size: Large)  - Pulse 65  - Ht 177.8 cm (5' 10)  - Wt 100.1 kg (220 lb 9.6 oz)  - SpO2 94%  - BMI 31.65 kg/m??      Wt Readings from Last 3 Encounters:   07/03/22 100.1 kg (220 lb 9.6 oz)   06/26/22 99.8 kg (220 lb)   03/26/22 100.2 kg (221 lb)       Physical Exam  General:  Pleasant male sitting in chair, in nad.   Neck: Supple. No JVD.   Resp:   CTAB bilaterally with normal WOB.   Cardio:  Regular. II/VI SEM heard best at RUSB.   Stable with prior documentation   Abdomen:   Soft, slightly distended   Extremities: Warm well-perfused bilaterally. Trace LE edema bilaterally.       Most Recent Labs   Lab Results   Component Value Date    NA 140 06/26/2022    K  4.9 (H) 06/26/2022    CL 105 06/26/2022    CO2 28.3 06/26/2022     Lab Results   Component Value Date    BUN 16 06/26/2022    BUN 24 (H) 03/12/2022    BUN 26 01/02/2021    BUN 23 11/08/2020     Lab Results   Component Value Date    Creatinine 2.27 (H) 06/26/2022    Creatinine 2.62 (H) 03/12/2022    Creatinine 2.62 (H) 01/02/2021    Creatinine 2.70 (H) 11/08/2020     Lab Results   Component Value Date    PRO-BNP 158.0 03/06/2016    PRO-BNP 50.8 01/03/2016     Lab Results   Component Value Date    Cholesterol 88 01/29/2020    Cholesterol, Total 140 01/03/2014    Triglycerides 188 (H) 01/29/2020    Triglycerides 80 01/03/2014    HDL 23 (L) 01/29/2020    HDL 53 01/03/2014    Non-HDL Cholesterol 65 (L) 01/29/2020    LDL Calculated 27 (L) 01/29/2020    LDL Cholesterol, Calculated 71 01/03/2014       -----------------------------------  Concha Se, MD  [facility]

## 2022-07-05 NOTE — Unmapped (Signed)
I saw and evaluated the patient, participating in the key portions of the service and reviewing pertinent diagnostic studies and records. I reviewed the resident’s note and agree with the findings and plan.  - Allanna Bresee H Avie Checo, MD, FACC

## 2022-07-10 NOTE — Unmapped (Signed)
[facility] Refill Coordination Note    Specialty Medication(s) to be Shipped:   Infectious Disease: Pifeltro and Tivicay    Other medication(s) to be shipped: No additional medications requested for fill at this time     Samuel [name], DOB: 07-08-1950  Phone: There are no phone numbers on file.      All above HIPAA information was verified with patient.     Was a Nurse, learning disability used for this call? No    Completed refill call assessment today to schedule patient's medication shipment from the [facility] [phone]).  All relevant notes have been reviewed.     Specialty medication(s) and dose(s) confirmed: Regimen is correct and unchanged.   Changes to medications: Cross reports no changes at this time.  Changes to insurance: No  New side effects reported not previously addressed with a pharmacist or physician: None reported  Questions for the pharmacist: No    Confirmed patient received a Conservation officer, historic buildings and a Surveyor, mining with first shipment. The patient will receive a drug information handout for each medication shipped and additional FDA Medication Guides as required.       DISEASE/MEDICATION-SPECIFIC INFORMATION        N/A    SPECIALTY MEDICATION ADHERENCE     Medication Adherence    Patient reported X missed doses in the last month: 0  Specialty Medication: tivicay 50mg   Patient is on additional specialty medications: Yes  Additional Specialty Medications: pifeltro 100mg   Patient Reported Additional Medication X Missed Doses in the Last Month: 0  Patient is on more than two specialty medications: No  Any gaps in refill history greater than 2 weeks in the last 3 months: no  Demonstrates understanding of importance of adherence: yes  Informant: patient  Reliability of informant: reliable  Provider-estimated medication adherence level: good  Patient is at risk for Non-Adherence: No  Reasons for non-adherence: no problems identified Were doses missed due to medication being on hold? No    PIFELTRO 100 mg Tab (doravirine)  : 3 days of medicine on hand   TIVICAY 50 mg TABLET (dolutegravir)  : 3 days of medicine on hand       REFERRAL TO PHARMACIST     Referral to the pharmacist: Not needed      [facility]     Shipping address confirmed in Epic.     Delivery Scheduled: Yes, Expected medication delivery date: 07/13/22.     Medication will be delivered via Same Day Courier to the prescription address in Epic WAM.    Darilyn Storbeck' W Danae Chen Shared [facility] Specialty Technician

## 2022-07-13 MED FILL — TIVICAY 50 MG TABLET: ORAL | 30 days supply | Qty: 30 | Fill #5

## 2022-07-13 MED FILL — PIFELTRO 100 MG TABLET: ORAL | 30 days supply | Qty: 30 | Fill #3

## 2022-07-23 DIAGNOSIS — E8770 Fluid overload, unspecified: Principal | ICD-10-CM

## 2022-07-23 MED ORDER — SPIRONOLACTONE 25 MG TABLET
ORAL_TABLET | Freq: Every day | ORAL | 3 refills | 0 days
Start: 2022-07-23 — End: ?

## 2022-07-24 MED ORDER — SPIRONOLACTONE 25 MG TABLET
ORAL_TABLET | Freq: Every day | ORAL | 3 refills | 90 days | Status: CP
Start: 2022-07-24 — End: ?

## 2022-07-24 NOTE — Unmapped (Signed)
Per the last MD note pt to continue taking Spironolactone 25mg  daily. Refilled #90 with 3 refills.

## 2022-08-06 ENCOUNTER — Ambulatory Visit: Admit: 2022-08-06 | Discharge: 2022-08-07 | Payer: MEDICARE

## 2022-08-06 DIAGNOSIS — Z2911 Encounter for prophylactic immunotherapy for respiratory syncytial virus (RSV): Principal | ICD-10-CM

## 2022-08-06 DIAGNOSIS — Z9189 Other specified personal risk factors, not elsewhere classified: Principal | ICD-10-CM

## 2022-08-06 DIAGNOSIS — E1140 Type 2 diabetes mellitus with diabetic neuropathy, unspecified: Principal | ICD-10-CM

## 2022-08-06 DIAGNOSIS — B20 Human immunodeficiency virus [HIV] disease: Principal | ICD-10-CM

## 2022-08-06 DIAGNOSIS — Z5181 Encounter for therapeutic drug level monitoring: Principal | ICD-10-CM

## 2022-08-06 DIAGNOSIS — Z1321 Encounter for screening for nutritional disorder: Principal | ICD-10-CM

## 2022-08-06 DIAGNOSIS — I151 Hypertension secondary to other renal disorders: Principal | ICD-10-CM

## 2022-08-06 DIAGNOSIS — N184 Chronic kidney disease, stage 4 (severe): Principal | ICD-10-CM

## 2022-08-06 DIAGNOSIS — K7581 Nonalcoholic steatohepatitis (NASH): Principal | ICD-10-CM

## 2022-08-06 DIAGNOSIS — I2510 Atherosclerotic heart disease of native coronary artery without angina pectoris: Principal | ICD-10-CM

## 2022-08-06 DIAGNOSIS — I5032 Chronic diastolic (congestive) heart failure: Principal | ICD-10-CM

## 2022-08-06 DIAGNOSIS — E669 Obesity, unspecified: Principal | ICD-10-CM

## 2022-08-06 DIAGNOSIS — Z79899 Other long term (current) drug therapy: Principal | ICD-10-CM

## 2022-08-06 LAB — CBC W/ AUTO DIFF
BASOPHILS ABSOLUTE COUNT: 0.1 10*9/L (ref 0.0–0.1)
BASOPHILS RELATIVE PERCENT: 0.6 %
EOSINOPHILS ABSOLUTE COUNT: 0.1 10*9/L (ref 0.0–0.5)
EOSINOPHILS RELATIVE PERCENT: 1.1 %
HEMATOCRIT: 44.8 % (ref 39.0–48.0)
HEMOGLOBIN: 14.9 g/dL (ref 12.9–16.5)
LYMPHOCYTES ABSOLUTE COUNT: 2.2 10*9/L (ref 1.1–3.6)
LYMPHOCYTES RELATIVE PERCENT: 24.5 %
MEAN CORPUSCULAR HEMOGLOBIN CONC: 33.2 g/dL (ref 32.0–36.0)
MEAN CORPUSCULAR HEMOGLOBIN: 30.6 pg (ref 25.9–32.4)
MEAN CORPUSCULAR VOLUME: 92.2 fL (ref 77.6–95.7)
MEAN PLATELET VOLUME: 9.6 fL (ref 6.8–10.7)
MONOCYTES ABSOLUTE COUNT: 0.6 10*9/L (ref 0.3–0.8)
MONOCYTES RELATIVE PERCENT: 7.2 %
NEUTROPHILS ABSOLUTE COUNT: 6 10*9/L (ref 1.8–7.8)
NEUTROPHILS RELATIVE PERCENT: 66.6 %
PLATELET COUNT: 127 10*9/L — ABNORMAL LOW (ref 150–450)
RED BLOOD CELL COUNT: 4.86 10*12/L (ref 4.26–5.60)
RED CELL DISTRIBUTION WIDTH: 13.4 % (ref 12.2–15.2)
WBC ADJUSTED: 9 10*9/L (ref 3.6–11.2)

## 2022-08-06 NOTE — Unmapped (Signed)
Good to see you today!    Your most recent CD4 count was:  Absolute CD4 Count   Date Value Ref Range Status   09/23/2021 612 510 - 2,320 /uL Final     CD4 T Cell Abs   Date Value Ref Range Status   01/02/2021 860 359 - 1,519 /uL Final     As long as your CD4 count is over the 200-300 range, you're OK.      Your most recent viral load was:  HIV RNA Quant Result   Date Value Ref Range Status   03/12/2022 Detected (A) Not Detected Final     HIV RNA   Date Value Ref Range Status   03/12/2022 <20 (H) <0 copies/mL Final     Remember the goal of your treatment is to keep the amount of virus in your blood very, very low.  When you take your medications consistently, most of the time it will say Not Detected in the HIV RNA Quant Result box.  If it says Detected, then take a look at the next box that says HIV RNA and find the row that matches the date for the HIV RNA Quant Result box.  If the number is really small (less than 50), there's nothing to worry about. This means the test picked up a tiny amount of virus that doesn't mean anything bad.  If the number is bigger than 1,500, then this means that there was a substantial amount of virus in your blood AND that it was possible for you to transmit HIV to others.    If you have any questions about any of your test results, please send Dr. Ian Bushman a message in MyChart.

## 2022-08-06 NOTE — Unmapped (Signed)
[facility]  [address]  [place], [place]  [address]  P [phone]  F [phone]     PCP:    Barbette Reichmann, MD   (hahn-DAY)    Last visit with me:  03/2022      Assessment/Plan:      HIV (dx'd 12/1999, nadir CD4 239 / 30% in 06/2011)  - chronic, stable  Current regimen: Descovy (FTC/TAF) and dolutegravir  Misses doses of ARVs rarely    ARV hx  Atripla (first regimen; rx'd 09/2007) - ~2010 - 07/2015  Descovy + Tivicay (d/t CKD) - 07/2015 - 12/2020  ABC 600/d + 3TC 100/d + DTG 50/d  - 12/2020 - 01/2021   (I didn't feel right - mostly GI symptoms)  Descovy + Tivicay - 01/2021 - 04/2022  Tivicay + Pifeltro - 04/2022 - present    Med access via Medicare  CD4 count over 300 for >2Y on suppressive ART; no prophylaxis needed; recheck CD4 annually  Discussed ARV adherence and avoiding antacids    Absolute CD4 Count   Date Value Ref Range Status   09/23/2021 612 510 - 2,320 /uL Final     CD4 T Cell Abs   Date Value Ref Range Status   01/02/2021 860 359 - 1,519 /uL Final     CD4% (T Helper)   Date Value Ref Range Status   09/23/2021 34 34 - 58 % Final     CD4 % Helper T Cell   Date Value Ref Range Status   01/02/2021 37.4 30.8 - 58.5 % Final     HIV RNA Quant Result   Date Value Ref Range Status   03/12/2022 Detected (A) Not Detected Final     HIV RNA   Date Value Ref Range Status   03/12/2022 <20 (H) <0 copies/mL Final       03/2022 - Doing OK overall - tolerated the switch back to Descovy from split ABC + 3TC w/o difficulty.   07/2022 - He tolerated the switch to Pifeltro well - no issues. Takes these at nighttime, before bedtime. Looked at potential issues w/doravirine or dolutegravir with iHD, and they both seem to be OK - though dosing after HD would probably be advisable.    CBC/diff, lymphocyte panel, and HIV RNA (other labs drawn @ PCP's office)  Continue current therapy  Encouraged continued excellent       CKD stage G4/A2, pre-transplant  - chronic, progressive     eGFR   Date Value Ref Range Status   08/04/2022 22.0 mg/dL Final     Comment:     drawn @ [facility] - via CareEverywhere     Creatinine   Date Value Ref Range Status   08/04/2022 3.00 mg/dL Final     Comment:     drawn @ [facility] - via [facility]where     Cystatin C   Date Value Ref Range Status   03/12/2022 2.26 (H) 0.64 - 1.23 mg/L Final       03/2022 - Initial transplant eval 07/2020. Most recent visit w/Hladik 08/2021. Per HPI, doing well in interim. BP 133/79. Slow progression. No indication for HD. He is to complete a colonoscopy as the final phase of his transplant evaluation. Plan = RTC 9m (planned for 03/26/2022). With respect to his ARVs, the problem is that now his eGFR is consistently under 30 mL/min, and use of fixed-dose Descovy isn't recommended; the threshold for TAF by itself is 15 mL/min, (so it's  OK) but for FTC it's 30 mL/min as the lower threshold before interval-spacing is required (for him, it'd be 200 mg every 72h). He's already having challenges keeping up w/his meds, so I'm concerned about making any changes at this time. I'm also a little concerned about possible influence of dolutegravir on creatinine secretion here and it maybe spuriously making his actual eGFR look worse - so checking a cystatin C seems reasonable to try to triangulate where he is currently.   07/2022 - Cystatin C at 03/2022 visit was in agreement w/SCr for eGFR. Sat Dr Stefano Gaul in f/u most recently on 26 Jan. Per note, xp eval is ongoing and pt favors HD over PD, when time comes; holding on referral for AVF creation.  Appreciate Dr Debria Garret mgm't - continuing on current meds  No changes in ARVs needed after switch off of FTC/TAF (Descovy) in 04/2022      Liver disease  - HCV, genotype 1a, dx'd 1990s and cured s/p ELB/GRZ x12 weeks in 2017  -  RESOLVED  - Chronic transaminitis and declining platelets c/f NASH (08/2018 - present)  - chronic, stable     Lab Results   Component Value Date    AST 92 (A) 08/04/2022    AST 57 (H) 06/26/2022    AST 55 (H) 03/12/2022    AST 52 (H) 09/23/2021    AST 58 (H) 06/06/2021     Lab Results   Component Value Date    ALT 134 (A) 08/04/2022    ALT 81 (H) 06/26/2022    ALT 81 (H) 03/12/2022    ALT 106 (H) 09/23/2021    ALT 128 (H) 06/06/2021       03/2022 - Liver bx on 08Dec2006 showed grade 2 stage 2. EGD 09/2015 was w/o varices (normal plts @ time per Dr Harrel Lemon note from 04/06/2016). Because of his CKD, he was treated with 12 weeks of ELB/GRZ. HCV RNA at start of tx in 01/2016 was 328K, down to ND by 05/2016 and ND on rechecks in 07/2016, 09/2016, 11/2017, 12/2019, and 06/2021. Most recent GI f/u was in Jan 2023 [facility] NP) - per her note, Fibroscan in clinic was now F0-1 from [facility] pre-treatment. Overall assessment was for NASH. Plan = RTC 5-59m (summer 2023) - no f/u in future appts. Today, doing fine - no interval issues reported  07/2022 - No interim visits w/[facility]. LFTs checked this past Monday by PCP and manually entered in Epic so they appear alongside other values - see above - a bit higher than most recent checks but not markedly out  Monitor LFTs over time - esp if he begins on semaglutide injections (pt mentioned this during visit)  If signif change can consider re-engagement w/[facility]      Cardiac disease  - HFpEF (EF 60-65% on TTE 09/2019)  - chronic, stable   - CAD s/p NSTEMI 10/2015 (LHC w/ multivessel dx - no stent, med mgm't) - chronic, stable   - Aortic sclerosis  - chronic, stable   - Difficult to control HTN  - chronic, stable   03/2022 - last visit w/Cards 11/2019 Lona Millard MD). Reported 67m worsening fatigue, was referred for home sleep study given c/f OSA. Carvedilol, ASA 81, atorva 20, torsemide 20, metolazone 10, spirono 25, amlo 7.5, atenolol 100, doxazosin 4, lisino 40, hydralazine 100 bid all cont'd. Plan was RTC in 45m (early 2022) - no f/u in future appts. Today, feeling well - no CP/DOE reported. He's walking frequently but  no signif other exercise.  07/2022 - saw cards fellow Dr Lona Millard on 02Feb for f/u - continuing all current meds.   Appreciate Cards' assistance  Monitor over time  Cont current meds      Pulmonary disease  - Abnormal chest CTs (4mm RLL nodule stable 03/2020 -> 07/2021, ?fibrotic changes 07/2021, 09/2021)  - chronic, stable  - 15 PY smoking hx, quit 1983  - chronic, stable    03/2022 - Saw Interventional Pulm in 09/2021. CTs reviewed, they felt the RLL nodule was of no signif concern given stability of 2Y timeline - but ordered HRCT given lower lung fibrotic vs atelectatic changes. HRCT completed 16May2023 with impression: Persistent but improved lower lung predominant subpleural reticular abnormality which may represent residual mild fibrosis, possibly from resolving organizing pneumonia. Representative cut (138/239) below. Plan for f/u not described in note.  Today, doing well - no interim issues.        07/2022 - no interim issues though he did describe briefly feeling sometimes a bit short of breath -  but nothing on a consistent basis and he's able to exercise on a regular basis (walking)    Monitor over time  Hold on referral to embedded Pulm w/Dr Sallyanne Kuster      Orthopaedic issues  - s/p ORIF R acetabular fx 1990s (@ [facility]), c/b displaced screw in quadriceps (chronic)  - chronic, stable  - s/p lumbar spinal surgery 1990s, c/b chronic LBP  - chronic, stable  - BLE numbness involving both feet, proximally to knees  - chronic, stable  03/2022 - Saw Ortho most recently 07/2020 (Obudzinski/Chen). Today, doing OK - just describes ongoing issues w/neuropathic discomfort in BLEs.  07/2022 - still describing neuropathy sxs and a general sense of heaviness in BLEs, w/o any s/sxs of claudication; he says the pregabalin is maybe helping more than the gaba had  Monitor over time  Continue pregabalin      Endocrine & metabolic issues  - T2DM without use of insulin (A1c 6.5% in 08/2021)  - chronic, stable   - Obesity (BMI 30.5)  - chronic, progressive   - Gout  - chronic, stable   - Abnormal TFTs (low TSH 10/2019 @ [facility], 03/2021 @ [facility])  - chronic, stable   - Abnormal lipids, on statin (HDL 33, LDL 36 in 03/2021 @ [facility])  - chronic, stable   - Normal 25(OH)D level 10/2019  - chronic, stable     Wt Readings from Last 4 Encounters:   08/06/22 97.5 kg (215 lb)   07/03/22 100.1 kg (220 lb 9.6 oz)   06/26/22 99.8 kg (220 lb)   03/26/22 100.2 kg (221 lb)   ...  09/30/21 99.3 kg (219 lb)   11/12/20 98.2 kg (216 lb 6.4 oz)   10/10/19 (!) 104.3 kg (230 lb)   07/14/18 (!) 102.9 kg (226 lb 14.4 oz)   11/02/17 (!) 102.3 kg (225 lb 9.6 oz)      Free T4   Date Value Ref Range Status   03/12/2022 0.86 (L) 0.89 - 1.76 ng/dL Final     T3, Free   Date Value Ref Range Status   03/12/2022 3.07 2.30 - 4.20 pg/mL Final     TSH   Date Value Ref Range Status   03/12/2022 0.508 (L) 0.550 - 4.780 uIU/mL Final     Lab Results   Component Value Date    VITDTOTAL 50.7 11/22/2019        03/2022 - Re: T2DM and elevated  A1c, he continues on current DM meds w/o any reported missed doses or issues. Re: weight/BMI, stable from 09/2021. Re: gout, no interval episodes. Re: thyroid symptoms, no palpitations, subjective warmth/cold, energy levels, etc.   07/2022 - TFTs from 03/2022 visit had FT4 and TSH slightly below range, FT3 WNL. No specific sxs today  Checking vit D level today  Monitor over time      Sexual health & secondary prevention  - chronic, stable  Married (wife) .  NEED TO INQUIRE @ FUTURE RTC      Lab Results   Component Value Date    RPR Nonreactive 03/12/2022    RPR Nonreactive 01/29/2020    CTNAA Negative 06/08/2018    GCNAA Negative 06/08/2018    SPECSOURCE Urine (Male) 06/08/2018       GC/CT NAATs - not being checked routinely for this patient  RPR - needed but deferred to future visit      Health maintenance  - chronic, stable    Oral health  He does not have a dentist. Last dental exam edentulous - but dentures have loosened and needs eval. Confirmed 12Oct2023.    Eye health  He does  use corrective lenses. Last eye exam 03/11/2022. Confirmed 12Oct2023.    Metabolic conditions    Wt Readings from Last 5 Encounters:   08/06/22 97.5 kg (215 lb)   07/03/22 100.1 kg (220 lb 9.6 oz)   06/26/22 99.8 kg (220 lb)   03/26/22 100.2 kg (221 lb)   03/12/22 99.3 kg (219 lb)     Lab Results   Component Value Date    CREATININE 3.00 08/04/2022    PROTEINUR 6.9 03/05/2020    GLUCOSEU Negative 04/14/2016    ALBCRERAT 63.2 (H) 06/26/2022    PCRATIOUR 0.075 03/05/2020    GLU 163 06/26/2022    A1C 6.8 08/04/2022    ALT 134 (A) 08/04/2022    ALT 81 (H) 06/26/2022    ALT 81 (H) 03/12/2022    VITDTOTAL 50.7 11/22/2019     # Kidney health - defer mgm't to Nephrology  # Bone health - total vitamin D 25(OH) level and TSH and FT4  # Diabetes assessment - fasting glucose today  # NAFLD assessment - refer to Hepatology    Communicable diseases     Lab Results   Component Value Date    QFTTBGOLD Negative 01/29/2020    QTBG NEGATIVE 07/01/2011    HEPAIGG Reactive (A) 01/29/2020    HEPBSAB Nonreactive 01/29/2020    HEPBCAB Reactive (A) 01/31/2020    HEPCAB Reactive (A) 01/29/2020    HCVRNA Not Detected 06/06/2021    HCVRNAIU 328,448 (H) 02/05/2016    HCVIU 161096 08/01/2014    RUBIG Positive 01/29/2020    VZVIGG Positive 01/29/2020     # TB screening - no longer needed; negative IGRA, low risk  # Hepatitis screening - repeat HCV screen periodically  # MMR screening - no further assessment needed    Cancer screening    Lab Results   Component Value Date    PSASCRN 0.4 07/09/2010    PSA 0.35 01/29/2020     PSA screening also negative in 10/2020 ([facility] / [facility] = 0.37)    # Anorectal - not yet done  # Colorectal -  FIT+DNA neg 10/2020 via PCP - but needs c-scope for transplant w/u per Dr Stefano Gaul note 08/2021  # Liver -  most recent ultrasound 18Jan2022 - neg  # Lung - screening not indicated  #  Prostate - repeat PSA 40m from prior  (mid-to-late 2024)    Cardiovascular disease  Lab Results   Component Value Date    CHOL 82 (A) 08/04/2022    HDL 31 08/04/2022    LDL 19 08/04/2022    NONHDL 65 (L) 01/29/2020    TRIG 161 08/04/2022     # The ASCVD Risk score (Arnett DK, et al., 2019) failed to calculate.  - is taking aspirin   - is taking statin  - BP control fair  - former smoker    # AAA screening - assessment needed but deferred to future visit    Immunization History   Administered Date(s) Administered    COVID-19 VAC,BIVALENT,MODERNA(BLUE CAP) 02/26/2021, 01/23/2022    COVID-19 VACCINE,MRNA(MODERNA)(PF) 08/12/2019, 03/27/2020, 07/27/2020    HEPATITIS B VACCINE ADULT, ADJUVANTED, IM(HEPLISAV B) 01/31/2020    HEPATITIS B VACCINE ADULT,IM(ENERGIX B, RECOMBIVAX) 04/06/2016, 05/21/2016, 08/24/2016    INFLUENZA INJ MDCK PF, QUAD,(FLUCELVAX)(61MO AND UP EGG FREE) 02/28/2019    INFLUENZA TIV (TRI) PF (IM) 02/08/2008, 03/26/2010, 03/25/2011    Influenza Vaccine Quad(IM)6 MO-Adult(PF) 07/24/2015, 02/05/2016, 03/08/2018, 03/05/2020, 02/26/2021    Influenza Virus Vaccine, unspecified formulation 06/15/2014, 07/24/2015, 02/05/2016, 03/15/2016, 03/31/2017    PNEUMOCOCCAL POLYSACCHARIDE 23-VALENT 07/13/2000, 08/19/2005, 12/01/2017    PPD Test 12/29/2006, 07/11/2008, 03/26/2010, 03/25/2011    Pneumococcal Conjugate 13-Valent 04/13/2012    SHINGRIX-ZOSTER VACCINE (HZV),RECOMBINANT,ADJUVANTED(IM) 09/16/2016, 12/01/2017    TdaP 08/31/2007, 08/04/2010, 10/27/2018    Tuberculin Skin Test;unspecified Formulation 12/29/2006, 07/11/2008, 03/26/2010, 03/25/2011       Immunizations needed - RSV  <-- paper Rx printed and given to him today      I personally spent 59 minutes face-to-face and non-face-to-face in the care of this patient, which includes all pre, intra, and post visit time on the date of service.      Disposition  Next appointment: 5-6 months      To do @ next RTC  Get RSV?  Semaglutide (as Ozempic)?  Sleep study?          Subjective        HPI  In addition to details in A&P above:        In back and down legs. There's another medicine they got me on, and it works some.  Takes pregabalin 75 mg BID - he feels like it's better than the gabapentin was.      Got blood draw on Monday from his PCP, and has an appt coming up on Monday.       He noticed after giving up chewing gum for Alwyn Pea that his FSBS have been improved.       Needs sleep study - not one at home.          Past Medical History:   Diagnosis Date    Abnormal EKG 11/09/2015    Acute encephalopathy 11/30/2016    Anemia in stage 3 chronic kidney disease (CMS-HCC) 01/19/2015    Last Assessment & Plan: Formatting of this note might be different from the original. # Severe iron deficiency anemia/hemoglobin- 6/ ferritin 5 [march 2017]. Status post IV iron- significant improvement of hemoglobin. Last IV Feraheme was 08/28/2015 where he received 510 mg. #Was seen by Dr. Donneta Romberg in February 2019 with complaints of worsening significant pica/fatigue/cold intolerance.  Started    Bradycardia 11/09/2015    Chronic hepatitis C without hepatic coma (CMS-HCC) 01/19/2015    CKD (chronic kidney disease) stage 3, GFR 30-59 ml/min (CMS-HCC) 01/08/2015    Coronary artery disease 10/2015  Multivessel disease, plan medical management after cath 6/17    GERD (gastroesophageal reflux disease)     Gout     HCV (hepatitis C virus) 11/22/2012    (genotype 1, HCV RNA =311,000; liver biopsy 05/2005 =chronic hepatitis grade II stage II).  Has consistently and repeatedly refused therapy.       HIV (human immunodeficiency virus infection) (CMS-HCC)     Undectable viral load 5/17    Hypertension     Nephrolithiasis     Nephrotic syndrome 03/05/2014    Neurological deficit present 11/13/2015    Weakness in lower extremities    Personal history of gout 10/27/2018    Renal mass     Followed by neprhology, MRI 8/16 improved, felt to be benign       Social History  Background - Originally from [facility] [place] Lived in [place] when he was younger. Was a coach for long-distance and high jump, triple jump.     Housing - in [place]  with wife - no pets at home  - has a son and daughter, grandkids and great-grandkids - all of them in [place] ([place], [place]) -- confirmed 03/2022  School / Work & Benefits - not in school, retired (previously worked doing ISS to help troubled youth), and on Medicare -- confirmed 03/2022    Tobacco -  15PY history of cigarettes, quit 1983  -- confirmed 03/2022  Alcohol - never drinks alcohol -- confirmed 03/2022  Substance use - previous IDU (VERY distant)  -- confirmed 03/2022      Medications and Allergies  He has a current medication list which includes the following prescription(s): pregabalin, accu-chek guide l1-l2 ctrl sol, albuterol, allopurinol, amlodipine, aspirin, atenolol, atorvastatin, baclofen, onetouch verio test strips, calcium carbonate-vitamin d2, carvedilol, cholecalciferol (vitamin d3 25 mcg (1,000 units)), clonidine hcl, diazepam, dicyclomine, tivicay, doravirine, dorzolamide-timolol, ferrous sulfate, folic acid, glimepiride, hydrocodone-acetaminophen, lisinopril, nitroglycerin, nystatin, ondansetron, oxycodone-acetaminophen, rocklatan, abrysvo, sertraline, sitagliptin phosphate, spironolactone, tamsulosin, timolol, and torsemide.    Allergies: Colchicine analogues and Tramadol      Family History  His family history includes Cancer in his father and mother; Diabetes in his brother; Heart disease (age of onset: 67) in his sister; Hypertension in his mother and sister; Kidney disease in his father; Kidney failure (age of onset: 5) in his sister; Stroke in his brother; Thyroid disease in his mother.               Objective      BP 138/84 (BP Site: L Arm, BP Position: Sitting, BP Cuff Size: Medium)  - Pulse 66  - Temp 36.7 ??C (98 ??F) (Oral)  - Ht 180.3 cm (5' 11)  - Wt 97.5 kg (215 lb)  - BMI 29.99 kg/m??      Const WDWN, NAD, non-toxic appearance    Eyes lids normal bilaterally, conjunctiva anicteric and noninjected OU   PERRL    ENMT normal appearance of external nose and ears, no nasal discharge   OP clear - edentulous - dentures in upper and lower   Neck neck of normal appearance and trachea midline   no thyromegaly, nodules, or tenderness    Lymph no LAD in neck    CV RRR, no r/g, S1/S2 - difficult auscultation (distant)  no peripheral edema, WWP    Resp normal WOB   on RA, no breathlessness with speaking, no coughing, CTAB    GI normal inspection, NTND, NABS - protuberant  no umbilical hernia on exam  GU deferred   MSK no clubbing or cyanosis of hands   no focal tenderness or abnormalities of joints of RUE, LUE, RLE, or LLE    Skin no rashes, lesions, or ulcers of visualized skin   no nodules or areas of induration of palpated skin    Neuro CNs II-XII grossly intact   sensation to light touch grossly intact throughout    Psych appropriate affect   oriented to person, place, time

## 2022-08-06 NOTE — Unmapped (Signed)
Started assessment with patient options: in clinic      Patient declined RW services at this time.     Housing Status  Stable/Permanent    Insurance  Medicare Part C    Reason for Declining: Not interested at this time.        Sonia Torres,  Benefits & Eligibility Coordinator  Time of Intervention: 5 minutes

## 2022-08-06 NOTE — Unmapped (Signed)
[facility] Refill Coordination Note    Specialty Medication(s) to be Shipped:   Infectious Disease: Pifeltro and Tivicay    Other medication(s) to be shipped: No additional medications requested for fill at this time     [name], DOB: Jan 30, 1951  Phone: There are no phone numbers on file.      All above HIPAA information was verified with patient.     Was a Nurse, learning disability used for this call? No    Completed refill call assessment today to schedule patient's medication shipment from the [facility] [phone]).  All relevant notes have been reviewed.     Specialty medication(s) and dose(s) confirmed: Regimen is correct and unchanged.   Changes to medications: Artyom reports no changes at this time.  Changes to insurance: No  New side effects reported not previously addressed with a pharmacist or physician: None reported  Questions for the pharmacist: No    Confirmed patient received a Conservation officer, historic buildings and a Surveyor, mining with first shipment. The patient will receive a drug information handout for each medication shipped and additional FDA Medication Guides as required.       DISEASE/MEDICATION-SPECIFIC INFORMATION        N/A    SPECIALTY MEDICATION ADHERENCE     Medication Adherence    Patient reported X missed doses in the last month: 0  Specialty Medication: tivicay 50mg   Patient is on additional specialty medications: Yes  Additional Specialty Medications: pifeltro 100mg   Patient Reported Additional Medication X Missed Doses in the Last Month: 0  Patient is on more than two specialty medications: No  Any gaps in refill history greater than 2 weeks in the last 3 months: no  Demonstrates understanding of importance of adherence: yes  Informant: patient  Reliability of informant: reliable  Provider-estimated medication adherence level: good  Patient is at risk for Non-Adherence: No  Reasons for non-adherence: no problems identified              Were doses missed due to medication being on hold? No    PIFELTRO 100 mg Tab (doravirine)  : 4 days of medicine on hand   TIVICAY 50 mg TABLET (dolutegravir)  : 4 days of medicine on hand       REFERRAL TO PHARMACIST     Referral to the pharmacist: Not needed      [facility]     Shipping address confirmed in Epic.     Patient was notified of new phone menu : No    Delivery Scheduled: Yes, Expected medication delivery date: 08/07/22.     Medication will be delivered via Same Day Courier to the prescription address in Epic WAM.    Jerrit Horen' W Danae Chen [facility] Specialty Technician

## 2022-08-07 DIAGNOSIS — B20 Human immunodeficiency virus [HIV] disease: Principal | ICD-10-CM

## 2022-08-07 LAB — LYMPH MARKER LIMITED,FLOW
ABSOLUTE CD3 CNT: 1650 {cells}/uL (ref 915–3400)
ABSOLUTE CD4 CNT: 770 {cells}/uL (ref 510–2320)
ABSOLUTE CD8 CNT: 858 {cells}/uL (ref 180–1520)
CD3% (T CELLS): 75 % (ref 61–86)
CD4% (T HELPER): 35 % (ref 34–58)
CD4:CD8 RATIO: 0.9 (ref 0.9–4.8)
CD8% T SUPPRESR: 39 % — ABNORMAL HIGH (ref 12–38)

## 2022-08-07 LAB — HIV RNA, QUANTITATIVE, PCR
HIV RNA QNT RSLT: DETECTED — AB
HIV RNA: <20 {copies}/mL — ABNORMAL HIGH (ref <0)

## 2022-08-07 MED FILL — TIVICAY 50 MG TABLET: ORAL | 30 days supply | Qty: 30 | Fill #6

## 2022-08-07 MED FILL — PIFELTRO 100 MG TABLET: ORAL | 30 days supply | Qty: 30 | Fill #4

## 2022-08-12 LAB — VITAMIN D 25 HYDROXY: VITAMIN D, TOTAL (25OH): 59.4 ng/mL (ref 20.0–80.0)

## 2022-08-31 DIAGNOSIS — N184 Chronic kidney disease, stage 4 (severe): Principal | ICD-10-CM

## 2022-08-31 DIAGNOSIS — E1122 Type 2 diabetes mellitus with diabetic chronic kidney disease: Principal | ICD-10-CM

## 2022-09-07 NOTE — Unmapped (Signed)
[facility] Refill Coordination Note    Specialty Medication(s) to be Shipped:   Infectious Disease: Pifeltro and Tivicay    Other medication(s) to be shipped: No additional medications requested for fill at this time     [name], DOB: 29-Jan-1951  Phone: There are no phone numbers on file.      All above HIPAA information was verified with patient.     Was a Nurse, learning disability used for this call? No    Completed refill call assessment today to schedule patient's medication shipment from the [facility] [phone]).  All relevant notes have been reviewed.     Specialty medication(s) and dose(s) confirmed: Regimen is correct and unchanged.   Changes to medications: Kayin reports no changes at this time.  Changes to insurance: No  New side effects reported not previously addressed with a pharmacist or physician: None reported  Questions for the pharmacist: No    Confirmed patient received a Conservation officer, historic buildings and a Surveyor, mining with first shipment. The patient will receive a drug information handout for each medication shipped and additional FDA Medication Guides as required.       DISEASE/MEDICATION-SPECIFIC INFORMATION        N/A    SPECIALTY MEDICATION ADHERENCE     Medication Adherence    Patient reported X missed doses in the last month: 0  Specialty Medication: tivicay 50mg   Patient is on additional specialty medications: Yes  Additional Specialty Medications: pifeltro 100mg   Patient Reported Additional Medication X Missed Doses in the Last Month: 0              Were doses missed due to medication being on hold? No    Tivicay 50mg   : 4 days of medicine on hand   Pifeltro 100mg   : 4 days of medicine on hand       REFERRAL TO PHARMACIST     Referral to the pharmacist: Not needed      [facility]     Shipping address confirmed in Epic.     Patient was notified of new phone menu : Yes    Delivery Scheduled: Yes, Expected medication delivery date: 4/9.     Medication will be delivered via Same Day Courier to the prescription address in Epic WAM.    [name]   [facility] Specialty Technician

## 2022-09-08 MED FILL — TIVICAY 50 MG TABLET: ORAL | 30 days supply | Qty: 30 | Fill #7

## 2022-09-08 MED FILL — PIFELTRO 100 MG TABLET: ORAL | 30 days supply | Qty: 30 | Fill #5

## 2022-09-24 NOTE — Unmapped (Signed)
HISTORY OF PRESENT ILLNESS:      [name] is a 72 year old man with stage G4:A2 chronic kidney disease who is evaluated today for stage G4:A2 chronic kidney disease.  He generally has felt well except for straining his right knee while moving a safe in his home.  He has not had dysgeusia, increased leg edema, chest pain, PND, orthopnea.  Dr. Marcello Fennel recently implemented semaglutide at 0.25 mg weekly and discontinued Sitagliptin appropriately.  He is tolerating this regimen well.  He plans to go to a cruise to the [place] in July.  His morning glucose today was 123 mg/dL.  His most recent EGFR was 22 mL/min.  Today we discussed options for progressive kidney disease including transplantation, peritoneal dialysis, home suitable hemodialysis, and in center hemodialysis. He indicated his preference for home therapies and probably peritoneal dialysis.  Laboratory studies today showed significant hyperkalemia.  He admits to eating about 5 bananas per day as well as other fruits including strawberries.  We discussed a low potassium diet and the need to stop spironolactone.  He will take Veltassa for about 3 days and will have repeat labs later this week through [facility].    MEDICATIONS:    Current Outpatient Medications   Medication Sig Dispense Refill    ACCU-CHEK GUIDE L1-L2 CTRL SOL Soln       albuterol (PROVENTIL HFA;VENTOLIN HFA) 90 mcg/actuation inhaler Inhale 2 puffs. Take as needed      allopurinoL (ZYLOPRIM) 100 MG tablet Take 2.5 tablets (250 mg total) by mouth daily.      amLODIPine (NORVASC) 10 MG tablet Take 1 tablet (10 mg total) by mouth daily. 90 tablet 3    aspirin (ECOTRIN) 81 MG tablet daily.   0    atenoloL (TENORMIN) 100 MG tablet Take 1 tablet (100 mg total) by mouth daily. 90 tablet 3    atorvastatin (LIPITOR) 40 MG tablet Take 1 tablet (40 mg total) by mouth daily.      BACLOFEN, BULK, TOP Apply topically. Baclofen 2%, Diclofenac 5%, Gabapentin 6%, Tetracaine 3%    Take as needed (Patient not taking: Reported on 07/03/2022)      blood sugar diagnostic (ONETOUCH VERIO TEST STRIPS) Strp Use 2 (two) times daily E11.22      calcium carbonate-vitamin D2 500 mg(1,250mg ) -200 unit tablet Take 1 tablet (500 mg total) by mouth two (2) times a day.      carvediloL (COREG) 12.5 MG tablet 1 tablet (12.5 mg total) two (2) times a day.      cholecalciferol, vitamin D3 25 mcg, 1,000 units,, 1,000 unit (25 mcg) tablet Take by mouth daily.      cloNIDine HCL (CATAPRES) 0.1 MG tablet Take 1 tablet (0.1 mg total) by mouth 3 (three) times a day. 270 tablet 3    diazepam (VALIUM) 5 MG tablet Take 1 tablet (5 mg total) by mouth nightly.      dicyclomine (BENTYL) 20 mg tablet       dolutegravir (TIVICAY) 50 mg TABLET Take 1 tablet (50 mg total) by mouth daily. 30 tablet 11    doravirine 100 mg Tab Take 1 tablet (100 mg total) by mouth daily. 30 tablet 11    dorzolamide-timoloL (COSOPT) 22.3-6.8 mg/mL ophthalmic solution Administer 1 drop to both eyes two (2) times a day.      ferrous sulfate 325 (65 FE) MG tablet Take 1 tablet (325 mg total) by mouth in the morning.      folic acid (FOLVITE) 1 MG tablet  0    glimepiride (AMARYL) 4 MG tablet Take 1 tablet (4 mg total) by mouth daily. 90 tablet 3    HYDROcodone-acetaminophen (NORCO) 5-325 mg per tablet Directions are take one tablet 30 minutes before physical therapy, then take one tablet at bedtime as needed for pain      lisinopriL (PRINIVIL,ZESTRIL) 40 MG tablet Take 1 tablet (40 mg total) by mouth daily. 90 tablet 3    nitroglycerin (NITROSTAT) 0.4 MG SL tablet Place 1 tablet (0.4 mg total) under the tongue every five (5) minutes as needed for chest pain. Maximum of 3 doses in 15 minutes. 25 tablet 0    nystatin (MYCOSTATIN) 100,000 unit/mL suspension Take 5 mL (500,000 Units total) by mouth daily. (Patient not taking: Reported on 07/03/2022)      ondansetron (ZOFRAN) 4 MG tablet take 1 tablet by mouth every 8 hours if needed for nausea (Patient not taking: Reported on 07/03/2022)      oxyCODONE-acetaminophen (PERCOCET) 5-325 mg per tablet Take 1 tablet by mouth in the morning.      pregabalin (LYRICA) 75 MG capsule Take 1 capsule (75 mg total) by mouth.      ROCKLATAN 0.02-0.005 % Drop INT 1 GTT INTO OU QHS      sertraline (ZOLOFT) 50 MG tablet take 1 tablet by mouth once daily      SITagliptin (JANUVIA) 50 MG tablet Take 1 tablet (50 mg total) by mouth daily. 90 tablet 3    spironolactone (ALDACTONE) 25 MG tablet TAKE 1 TABLET(25 MG) BY MOUTH DAILY 90 tablet 3    tamsulosin (FLOMAX) 0.4 mg capsule TAKE 1 CAPSULE(0.4 MG) BY MOUTH DAILY (Patient not taking: Reported on 07/03/2022) 90 capsule 3    timolol (TIMOPTIC) 0.5 % ophthalmic solution INSTILL 1 DROP IN BOTH EYES EVERY MORNING      torsemide (DEMADEX) 20 MG tablet Take 2 tablets (40 mg total) by mouth daily. 90 tablet 3     No current facility-administered medications for this visit.       REVIEW OF SYSTEMS:  Constitutional: No constitutional symptoms.  No dysgeusia.  Cardiovascular: No chest pain.  Pulmonary: No cough or hemoptysis.  All other systems are reviewed and are negative except that noted in the history of present illness.    PHYSICAL EXAMINATION:      Constitutional: He is in no acute distress.  Vital signs: BP 131/71 (BP Site: L Arm, BP Position: Sitting, BP Cuff Size: Large)  - Pulse 67  - Temp 36.2 ??C (97.2 ??F) (Temporal)  - Wt 99.2 kg (218 lb 12.8 oz)  - BMI 30.52 kg/m??   Wt Readings from Last 3 Encounters:   09/29/22 99.2 kg (218 lb 12.8 oz)   08/06/22 97.5 kg (215 lb)   07/03/22 100.1 kg (220 lb 9.6 oz)   HEENT: No oropharyngeal lesions.Neck: Supple without lymphadenopathy or thyromegaly. No carotid bruits.   Lungs: Clear to auscultation   Heart: Regular rate and rhythm. Normal S1-S2.   Abdomen: Slightly protuberant.  Soft, nontender, normoactive bowel sounds.  Liver span is about 9 cm to percussion.  Neurologic: Alert and oriented x 3.  Strength is normal.  DTRs are 2+ and symmetric.  There are no lateralizing sensory deficits.  No asterixis.  Extremities: Trace pretibial edema.  Dorsalis pedis and posterior tibial pulses are palpable.    Node survey: No lymphadenopathy.   Musculoskeletal: No active synovitis.   Skin/Nails:  No rash or nail changes.      LABORATORY STUDIES:  Recent Results (from the past 168 hour(s))   Renal Function Panel    Collection Time: 09/29/22 10:11 AM   Result Value Ref Range    Sodium 137 135 - 145 mmol/L    Potassium 6.1 (HH) 3.4 - 4.8 mmol/L    Chloride 104 98 - 107 mmol/L    CO2 28.2 20.0 - 31.0 mmol/L    Anion Gap 5 5 - 14 mmol/L    BUN 36 (H) 9 - 23 mg/dL    Creatinine 8.11 (H) 0.73 - 1.18 mg/dL    BUN/Creatinine Ratio 10     eGFR CKD-EPI (2021) Male 18 (L) >=60 mL/min/1.69m2    Glucose 114 70 - 179 mg/dL    Calcium 91.4 8.7 - 78.2 mg/dL    Phosphorus 3.2 2.4 - 5.1 mg/dL    Albumin 4.3 3.4 - 5.0 g/dL   POCT Urinalysis Dipstick    Collection Time: 09/29/22 10:18 AM   Result Value Ref Range    Spec Gravity/POC 1.015 1.003 - 1.030    PH/POC 7.0 5.0 - 9.0    Leuk Esterase/POC Negative Negative    Nitrite/POC Negative Negative    Protein/POC Negative Negative    UA Glucose/POC Negative Negative    Ketones, POC Negative Negative    Bilirubin/POC Negative Negative    Blood/POC Negative Negative    Urobilinogen/POC 0.2 0.2 - 1.0 mg/dL   Albumin/creatinine urine ratio    Collection Time: 09/29/22 10:20 AM   Result Value Ref Range    Creat U 49.4 Undefined mg/dL    Albumin Quantitative, Urine 0.4 Undefined mg/dL    Albumin/Creatinine Ratio 8.1 0.0 - 30.0 ug/mg       03/24/2022    Sodium 142 potassium 4.7 chloride 107 total CO2 27 BUN 31 creatinine 3.0 eGFR 25 glucose 109 calcium 10.5 AST 75 ALT 111 alk phos 77 albumin 4.7 bilirubin 0.7 total protein 7.6    White count 11.6 hemoglobin 14.8 platelets 145    Cholesterol 86 triglycerides 85 HDL cholesterol 33 LDL 36    Urine albumin to creatinine ratio 121 mg/g    Hemoglobin A1c 5.6    TSH 0.311    Serum creatinine trend:    Date Value   03/24/2022 3.00 (H)   03/12/2022 2.62 (H)   09/23/2021 3.26 (H)   06/06/2021 2.81 (H)   02/26/2021 2.18 (H)   01/29/2021 2.63 (H)   01/02/2021 2.62 (H)   11/08/2020 2.70 (H)     Imaging:    Chest CT 06/24/2021    Chest CT shows stability of the right pulmonary nodule.  However, it showed increased bilateral lower lobe groundglass and reticular abnormality may represent changes of infectious or organizing pneumonia (including autoimmune or medication-related etiologies).      IMPRESSION AND PLAN:    1.  Stage G4/A2 chronic kidney disease.  He continues to have slow progression of chronic kidney disease.  We had detailed discussions today regarding options for dialysis and transplant.  He favors peritoneal dialysis.  If his EGFR continues to fall, I will refer him to the kidney care advocate for further education.  He has excellent care through the infectious disease clinic with Dr. Ian Bushman, who was appropriately adjusted his heart therapy.  Ongoing intensive monitoring of his kidney status is indicated given his relatively low EGFR and risk for requiring dialysis and/or transplant in the future.  We did discuss that living donor transplant would be the best option given his advancing age.    2.  Life-threatening hyperkalemia.  I asked him to stop spironolactone and to start a low potassium diet.  He is going to be treated with but urinary calcium for 3 days and will have follow-up laboratory studies later this week to confirm resolution of significant hyperkalemia.  I am not convinced we need to stop lisinopril yet but this may become a consideration based on follow-up laboratory studies.  To note, the aldosterone inhibition from spironolactone can persist for several weeks.     3.  Hypertension.  Blood pressure is well-controlled on amlodipine 10 mg daily, lisinopril 40 mg daily, hydralazine 100 mg twice daily, atenolol 100 mg once daily, doxazosin 4 mg daily, torsemide 40 mg daily, and spironolactone 25 mg daily.    4.  Human immunodeficiency virus disease.  He will follow-up with Dr. Kenyon Ana and will maintain his current regimen of doravirine and dolutegravir.    5.  Lower urinary tract symptoms.  He is doing well on tamsulosin.    6.  Secondary/tertiary hyperparathyroidism.  His bone mineral indices are in target range.      7.  Type 2 diabetes mellitus.  He will continue to follow-up with Dr. Eston Esters excellent care. Semaglutide can likely be increased to 0.5 mg weekly at his next visit.    8.  4 mm RLL pulmonary nodule and groundglass infiltrates on chest x-ray.  This has been stable on follow-up imaging when last evaluated in May 2023.    9.  Metabolic dysfunction associated with fatty liver disease.  He was encouraged to continue exercise as tolerated as well as semaglutide therapy.    10.  Chronic low back pain.  He will continue to follow-up with [facility].    11. Peripheral neuropathy.  He is doing well on pregabalin.    12. Follow up plans.  He will start Veltassa (patiromer calcium), stop spironolactone, and have follow-up laboratory studies in 3 to 5 days.  He was again given the number to schedule the colonoscopy and sleep clinic evaluation.  I will see him back again in 3 months.      Zetta Bills. Stefano Gaul, MD  Date of service 09/29/2022

## 2022-09-29 ENCOUNTER — Ambulatory Visit: Admit: 2022-09-29 | Discharge: 2022-09-30 | Payer: MEDICARE | Attending: Nephrology | Primary: Nephrology

## 2022-09-29 DIAGNOSIS — E669 Obesity, unspecified: Principal | ICD-10-CM

## 2022-09-29 DIAGNOSIS — M1000 Idiopathic gout, unspecified site: Principal | ICD-10-CM

## 2022-09-29 DIAGNOSIS — I5032 Chronic diastolic (congestive) heart failure: Principal | ICD-10-CM

## 2022-09-29 DIAGNOSIS — N184 Chronic kidney disease, stage 4 (severe): Principal | ICD-10-CM

## 2022-09-29 DIAGNOSIS — I214 Non-ST elevation (NSTEMI) myocardial infarction: Principal | ICD-10-CM

## 2022-09-29 DIAGNOSIS — G4733 Obstructive sleep apnea (adult) (pediatric): Principal | ICD-10-CM

## 2022-09-29 DIAGNOSIS — K7581 Nonalcoholic steatohepatitis (NASH): Principal | ICD-10-CM

## 2022-09-29 DIAGNOSIS — F3340 Major depressive disorder, recurrent, in remission, unspecified: Principal | ICD-10-CM

## 2022-09-29 DIAGNOSIS — I2510 Atherosclerotic heart disease of native coronary artery without angina pectoris: Principal | ICD-10-CM

## 2022-09-29 DIAGNOSIS — E1140 Type 2 diabetes mellitus with diabetic neuropathy, unspecified: Principal | ICD-10-CM

## 2022-09-29 DIAGNOSIS — I151 Hypertension secondary to other renal disorders: Principal | ICD-10-CM

## 2022-09-29 DIAGNOSIS — E875 Hyperkalemia: Principal | ICD-10-CM

## 2022-09-29 DIAGNOSIS — B20 Human immunodeficiency virus [HIV] disease: Principal | ICD-10-CM

## 2022-09-29 LAB — RENAL FUNCTION PANEL
ALBUMIN: 4.3 g/dL (ref 3.4–5.0)
ANION GAP: 5 mmol/L (ref 5–14)
BLOOD UREA NITROGEN: 36 mg/dL — ABNORMAL HIGH (ref 9–23)
BUN / CREAT RATIO: 10
CALCIUM: 10.3 mg/dL (ref 8.7–10.4)
CHLORIDE: 104 mmol/L (ref 98–107)
CO2: 28.2 mmol/L (ref 20.0–31.0)
CREATININE: 3.51 mg/dL — ABNORMAL HIGH
EGFR CKD-EPI (2021) MALE: 18 mL/min/{1.73_m2} — ABNORMAL LOW (ref >=60)
GLUCOSE RANDOM: 114 mg/dL (ref 70–179)
PHOSPHORUS: 3.2 mg/dL (ref 2.4–5.1)
POTASSIUM: 6.1 mmol/L (ref 3.4–4.8)
SODIUM: 137 mmol/L (ref 135–145)

## 2022-09-29 LAB — ALBUMIN / CREATININE URINE RATIO
ALBUMIN QUANT URINE: 0.4 mg/dL
ALBUMIN/CREATININE RATIO: 8.1 ug/mg (ref 0.0–30.0)
CREATININE, URINE: 49.4 mg/dL

## 2022-09-29 LAB — PARATHYROID HORMONE (PTH): PARATHYROID HORMONE INTACT: 84.9 pg/mL — ABNORMAL HIGH (ref 18.4–80.1)

## 2022-09-29 MED ORDER — PATIROMER CALCIUM SORBITEX 8.4 GRAM ORAL POWDER PACKET
PACK | Freq: Every day | ORAL | 0 refills | 3 days | Status: CP
Start: 2022-09-29 — End: 2022-10-02

## 2022-09-29 NOTE — Unmapped (Addendum)
It was great to see you today. Please schedule the following studies:     Colonoscopy [phone]  [facility] [phone] -  You may need to bring a copy of the order provided today    Labs today-I will call you with the results-if the kidney function is decreasing, it would be good to for you to learn more about your options including transplant and dialysis

## 2022-10-05 DIAGNOSIS — E875 Hyperkalemia: Principal | ICD-10-CM

## 2022-10-07 NOTE — Unmapped (Signed)
[facility] Refill Coordination Note    Specialty Medication(s) to be Shipped:   Infectious Disease: Pifeltro and Tivicay    Other medication(s) to be shipped: No additional medications requested for fill at this time     [name], DOB: 04/09/1951  Phone: There are no phone numbers on file.      All above HIPAA information was verified with patient.     Was a Nurse, learning disability used for this call? No    Completed refill call assessment today to schedule patient's medication shipment from the [facility] [phone]).  All relevant notes have been reviewed.     Specialty medication(s) and dose(s) confirmed: Regimen is correct and unchanged.   Changes to medications: Ellie reports no changes at this time.  Changes to insurance: No  New side effects reported not previously addressed with a pharmacist or physician: None reported  Questions for the pharmacist: No    Confirmed patient received a Conservation officer, historic buildings and a Surveyor, mining with first shipment. The patient will receive a drug information handout for each medication shipped and additional FDA Medication Guides as required.       DISEASE/MEDICATION-SPECIFIC INFORMATION        N/A    SPECIALTY MEDICATION ADHERENCE     Medication Adherence    Patient reported X missed doses in the last month: 0  Specialty Medication: TIVICAY 50 mg TABLET (dolutegravir)  Patient is on additional specialty medications: Yes  Additional Specialty Medications: PIFELTRO 100 mg Tab (doravirine)  Patient Reported Additional Medication X Missed Doses in the Last Month: 0              Were doses missed due to medication being on hold? No    Tivicay 50mg   : 2 days of medicine on hand   Pifeltro 100mg   : 2 days of medicine on hand       REFERRAL TO PHARMACIST     Referral to the pharmacist: Not needed      [facility]     Shipping address confirmed in Epic.       Delivery Scheduled: Yes, Expected medication delivery date: 5/9.     Medication will be delivered via Same Day Courier to the prescription address in Epic WAM.    [name]   [facility] Specialty Technician

## 2022-10-08 MED FILL — PIFELTRO 100 MG TABLET: ORAL | 30 days supply | Qty: 30 | Fill #6

## 2022-10-08 MED FILL — TIVICAY 50 MG TABLET: ORAL | 30 days supply | Qty: 30 | Fill #8

## 2022-10-09 LAB — BASIC METABOLIC PANEL
BLOOD UREA NITROGEN: 35 mg/dL — ABNORMAL HIGH (ref 8–27)
BUN / CREAT RATIO: 10 (ref 10–24)
CALCIUM: 9.8 mg/dL (ref 8.6–10.2)
CHLORIDE: 104 mmol/L (ref 96–106)
CO2: 23 mmol/L (ref 20–29)
CREATININE: 3.37 mg/dL — ABNORMAL HIGH (ref 0.76–1.27)
EGFR: 19 mL/min/{1.73_m2} — ABNORMAL LOW
GLUCOSE: 199 mg/dL — ABNORMAL HIGH (ref 70–99)
POTASSIUM: 4.4 mmol/L (ref 3.5–5.2)
SODIUM: 142 mmol/L (ref 134–144)

## 2022-10-12 DIAGNOSIS — E875 Hyperkalemia: Principal | ICD-10-CM

## 2022-10-19 DIAGNOSIS — E875 Hyperkalemia: Principal | ICD-10-CM

## 2022-10-29 NOTE — Unmapped (Signed)
[facility] Refill Coordination Note    Specialty Medication(s) to be Shipped:   Infectious Disease: Pifeltro and Tivicay    Other medication(s) to be shipped: No additional medications requested for fill at this time     Samuel [name], DOB: 1950-12-14  Phone: There are no phone numbers on file.      All above HIPAA information was verified with patient.     Was a Nurse, learning disability used for this call? No    Completed refill call assessment today to schedule patient's medication shipment from the [facility] [phone]).  All relevant notes have been reviewed.     Specialty medication(s) and dose(s) confirmed: Regimen is correct and unchanged.   Changes to medications: Angelito reports no changes at this time.  Changes to insurance: No  New side effects reported not previously addressed with a pharmacist or physician: None reported  Questions for the pharmacist: No    Confirmed patient received a Conservation officer, historic buildings and a Surveyor, mining with first shipment. The patient will receive a drug information handout for each medication shipped and additional FDA Medication Guides as required.       DISEASE/MEDICATION-SPECIFIC INFORMATION        N/A    SPECIALTY MEDICATION ADHERENCE     Medication Adherence    Patient reported X missed doses in the last month: 0  Specialty Medication: PIFELTRO 100 mg Tab (doravirine)  Patient is on additional specialty medications: Yes  Additional Specialty Medications: TIVICAY 50 mg TABLET (dolutegravir)  Patient Reported Additional Medication X Missed Doses in the Last Month: 0  Patient is on more than two specialty medications: No  Any gaps in refill history greater than 2 weeks in the last 3 months: no  Demonstrates understanding of importance of adherence: yes  Informant: patient  Confirmed plan for next specialty medication refill: delivery by pharmacy  Refills needed for supportive medications: not needed          Refill Coordination    Has the Patients' Contact Information Changed: No  Is the Shipping Address Different: No         Were doses missed due to medication being on hold? No    TIVICAY 50  mg: 10 days of medicine on hand   PIFELTRO 100  mg: 10 days of medicine on hand       REFERRAL TO PHARMACIST     Referral to the pharmacist: Not needed      [facility]     Shipping address confirmed in Epic.       Delivery Scheduled: Yes, Expected medication delivery date: 11/03/2022.     Medication will be delivered via Next Day Courier to the prescription address in Epic WAM.    Kerby Less   [facility] Specialty Technician

## 2022-11-02 MED FILL — TIVICAY 50 MG TABLET: ORAL | 30 days supply | Qty: 30 | Fill #9

## 2022-11-02 MED FILL — PIFELTRO 100 MG TABLET: ORAL | 30 days supply | Qty: 30 | Fill #7

## 2022-11-11 NOTE — Unmapped (Signed)
Called patient to discuss transplant evaluation.  He stated he has not had a colonoscopy but believes that he had a cologuard last year some time.  I cannot see those records in CareEverywhere at this time.  I explained that I would need those records and we would have to repeat all of his testing at this time as it was all completed in 2021.  Discussed that he would get waiting time to at least 10/28/2016 based on GFR modification.      He ultimately decided to close his case at this time.  I told him once he nears the need for dialysis, Dr. Stefano Gaul can re-refer him and we can hopefully get him listed at that time.  Patient remains dialysis free and has had a very slow kidney function decline.  Case closed letter sent today with re-referral information included.

## 2022-11-23 DIAGNOSIS — N184 Chronic kidney disease, stage 4 (severe): Principal | ICD-10-CM

## 2022-11-23 DIAGNOSIS — E1122 Type 2 diabetes mellitus with diabetic chronic kidney disease: Principal | ICD-10-CM

## 2022-11-25 NOTE — Unmapped (Signed)
[facility] Refill Coordination Note    Specialty Medication(s) to be Shipped:   Infectious Disease: Pifeltro and Tivicay    Other medication(s) to be shipped: No additional medications requested for fill at this time     Samuel [name], DOB: 04/19/1951  Phone: There are no phone numbers on file.      All above HIPAA information was verified with patient.     Was a Nurse, learning disability used for this call? No    Completed refill call assessment today to schedule patient's medication shipment from the [facility] [phone]).  All relevant notes have been reviewed.     Specialty medication(s) and dose(s) confirmed: Regimen is correct and unchanged.   Changes to medications: [name] reports no changes at this time.  Changes to insurance: No  New side effects reported not previously addressed with a pharmacist or physician: None reported  Questions for the pharmacist: No    Confirmed patient received a Conservation officer, historic buildings and a Surveyor, mining with first shipment. The patient will receive a drug information handout for each medication shipped and additional FDA Medication Guides as required.       DISEASE/MEDICATION-SPECIFIC INFORMATION        N/A    SPECIALTY MEDICATION ADHERENCE     Medication Adherence    Patient reported X missed doses in the last month: 0  Specialty Medication: PIFELTRO 100 mg Tab (doravirine)  Patient is on additional specialty medications: Yes  Additional Specialty Medications: TIVICAY 50 mg TABLET (dolutegravir)  Patient Reported Additional Medication X Missed Doses in the Last Month: 0  Patient is on more than two specialty medications: No  Any gaps in refill history greater than 2 weeks in the last 3 months: no  Demonstrates understanding of importance of adherence: yes  Informant: patient  Confirmed plan for next specialty medication refill: delivery by pharmacy  Refills needed for supportive medications: not needed          Refill Coordination    Has the Patients' Contact Information Changed: No  Is the Shipping Address Different: No         Were doses missed due to medication being on hold? No    PIFELTRO 100  mg: 7 days of medicine on hand   TIVICAY 50  mg: 7 days of medicine on hand       REFERRAL TO PHARMACIST     Referral to the pharmacist: Not needed      [facility]     Shipping address confirmed in Epic.       Delivery Scheduled: Yes, Expected medication delivery date: 12/01/2022.     Medication will be delivered via Same Day Courier to the prescription address in Epic WAM.    Kerby Less   [facility] Specialty Technician

## 2022-11-30 MED FILL — TIVICAY 50 MG TABLET: ORAL | 30 days supply | Qty: 30 | Fill #10

## 2022-11-30 MED FILL — PIFELTRO 100 MG TABLET: ORAL | 30 days supply | Qty: 30 | Fill #8

## 2022-12-24 NOTE — Unmapped (Unsigned)
HISTORY OF PRESENT ILLNESS:      Samuel [name] is a 72 year old man with stage G4:A2 chronic kidney disease who is evaluated today for stage G4/A2 chronic kidney disease.     He generally has felt well except for straining his right knee while moving a safe in his home.  He has not had dysgeusia, increased leg edema, chest pain, PND, orthopnea.  Dr. Marcello Fennel recently implemented semaglutide at 0.25 mg weekly and discontinued Sitagliptin appropriately.  He is tolerating this regimen well.  He plans to go to a cruise to the [place] in July.  His morning glucose today was 123 mg/dL.  His most recent EGFR was 22 mL/min.  Today we discussed options for progressive kidney disease including transplantation, peritoneal dialysis, home suitable hemodialysis, and in center hemodialysis. He indicated his preference for home therapies and probably peritoneal dialysis.  Laboratory studies today showed significant hyperkalemia.  He admits to eating about 5 bananas per day as well as other fruits including strawberries.  We discussed a low potassium diet and the need to stop spironolactone.  He will take Veltassa for about 3 days and will have repeat labs later this week through [facility].    MEDICATIONS:    Current Outpatient Medications   Medication Sig Dispense Refill    ACCU-CHEK GUIDE L1-L2 CTRL SOL Soln       albuterol (PROVENTIL HFA;VENTOLIN HFA) 90 mcg/actuation inhaler Inhale 2 puffs. Take as needed      allopurinoL (ZYLOPRIM) 100 MG tablet Take 2.5 tablets (250 mg total) by mouth daily.      amLODIPine (NORVASC) 10 MG tablet Take 1 tablet (10 mg total) by mouth daily. 90 tablet 3    aspirin (ECOTRIN) 81 MG tablet daily.   0    atenoloL (TENORMIN) 100 MG tablet Take 1 tablet (100 mg total) by mouth daily. 90 tablet 3    atorvastatin (LIPITOR) 40 MG tablet Take 1 tablet (40 mg total) by mouth daily.      blood sugar diagnostic (ONETOUCH VERIO TEST STRIPS) Strp Use 2 (two) times daily E11.22      calcium carbonate-vitamin D2 500 mg(1,250mg ) -200 unit tablet Take 1 tablet (500 mg total) by mouth two (2) times a day.      carvediloL (COREG) 12.5 MG tablet 1 tablet (12.5 mg total) two (2) times a day.      cholecalciferol, vitamin D3 25 mcg, 1,000 units,, 1,000 unit (25 mcg) tablet Take by mouth daily.      cloNIDine HCL (CATAPRES) 0.1 MG tablet Take 1 tablet (0.1 mg total) by mouth 3 (three) times a day. 270 tablet 3    diazepam (VALIUM) 5 MG tablet Take 1 tablet (5 mg total) by mouth nightly.      dicyclomine (BENTYL) 20 mg tablet       dolutegravir (TIVICAY) 50 mg TABLET Take 1 tablet (50 mg total) by mouth daily. 30 tablet 11    doravirine 100 mg Tab Take 1 tablet (100 mg total) by mouth daily. 30 tablet 11    dorzolamide-timoloL (COSOPT) 22.3-6.8 mg/mL ophthalmic solution Administer 1 drop to both eyes two (2) times a day.      ferrous sulfate 325 (65 FE) MG tablet Take 1 tablet (325 mg total) by mouth in the morning.      folic acid (FOLVITE) 1 MG tablet   0    glimepiride (AMARYL) 4 MG tablet Take 1 tablet (4 mg total) by mouth daily. 90 tablet 3    HYDROcodone-acetaminophen (  NORCO) 5-325 mg per tablet Directions are take one tablet 30 minutes before physical therapy, then take one tablet at bedtime as needed for pain      lisinopriL (PRINIVIL,ZESTRIL) 40 MG tablet Take 1 tablet (40 mg total) by mouth daily. 90 tablet 3    nitroglycerin (NITROSTAT) 0.4 MG SL tablet Place 1 tablet (0.4 mg total) under the tongue every five (5) minutes as needed for chest pain. Maximum of 3 doses in 15 minutes. 25 tablet 0    nystatin (MYCOSTATIN) 100,000 unit/mL suspension Take 5 mL (500,000 Units total) by mouth daily. (Patient not taking: Reported on 07/03/2022)      ondansetron (ZOFRAN) 4 MG tablet take 1 tablet by mouth every 8 hours if needed for nausea (Patient not taking: Reported on 07/03/2022)      oxyCODONE-acetaminophen (PERCOCET) 5-325 mg per tablet Take 1 tablet by mouth in the morning.      pregabalin (LYRICA) 75 MG capsule Take 1 capsule (75 mg total) by mouth.      ROCKLATAN 0.02-0.005 % Drop INT 1 GTT INTO OU QHS      sertraline (ZOLOFT) 50 MG tablet take 1 tablet by mouth once daily      SITagliptin (JANUVIA) 50 MG tablet Take 1 tablet (50 mg total) by mouth daily. 90 tablet 3    spironolactone (ALDACTONE) 25 MG tablet TAKE 1 TABLET(25 MG) BY MOUTH DAILY 90 tablet 3    tamsulosin (FLOMAX) 0.4 mg capsule TAKE 1 CAPSULE(0.4 MG) BY MOUTH DAILY (Patient not taking: Reported on 07/03/2022) 90 capsule 3    timolol (TIMOPTIC) 0.5 % ophthalmic solution INSTILL 1 DROP IN BOTH EYES EVERY MORNING      torsemide (DEMADEX) 20 MG tablet Take 2 tablets (40 mg total) by mouth daily. 90 tablet 3     No current facility-administered medications for this visit.       REVIEW OF SYSTEMS:  Constitutional: No constitutional symptoms.  No dysgeusia.  Cardiovascular: No chest pain.  Pulmonary: No cough or hemoptysis.  All other systems are reviewed and are negative except that noted in the history of present illness.    PHYSICAL EXAMINATION:      Constitutional: He is in no acute distress.  Vital signs:   HEENT: No oropharyngeal lesions.Neck: Supple without lymphadenopathy or thyromegaly. No carotid bruits.   Lungs: Clear to auscultation   Heart: Regular rate and rhythm. Normal S1-S2.   Abdomen: Slightly protuberant.  Soft, nontender, normoactive bowel sounds.  Liver span is about 9 cm to percussion.  Neurologic: Alert and oriented x 3.  Strength is normal.  DTRs are 2+ and symmetric.  There are no lateralizing sensory deficits.  No asterixis.  Extremities: Trace pretibial edema.  Dorsalis pedis and posterior tibial pulses are palpable.    Node survey: No lymphadenopathy.   Musculoskeletal: No active synovitis.   Skin/Nails:  No rash or nail changes.      LABORATORY STUDIES:          03/24/2022    Sodium 142 potassium 4.7 chloride 107 total CO2 27 BUN 31 creatinine 3.0 eGFR 25 glucose 109 calcium 10.5 AST 75 ALT 111 alk phos 77 albumin 4.7 bilirubin 0.7 total protein 7.6    White count 11.6 hemoglobin 14.8 platelets 145    Cholesterol 86 triglycerides 85 HDL cholesterol 33 LDL 36    Urine albumin to creatinine ratio 121 mg/g    Hemoglobin A1c 5.6    TSH 0.311    Serum creatinine trend:  Imaging:    Chest CT 06/24/2021    Chest CT shows stability of the right pulmonary nodule.  However, it showed increased bilateral lower lobe groundglass and reticular abnormality may represent changes of infectious or organizing pneumonia (including autoimmune or medication-related etiologies).      IMPRESSION AND PLAN:    1.  Stage G4/A2 chronic kidney disease.      He continues to have slow progression of chronic kidney disease.  We had detailed discussions today regarding options for dialysis and transplant.  He favors peritoneal dialysis.  If his EGFR continues to fall, I will refer him to the kidney care advocate for further education.  He has excellent care through the infectious disease clinic with Dr. Ian Bushman, who was appropriately adjusted his heart therapy.  Ongoing intensive monitoring of his kidney status is indicated given his relatively low EGFR and risk for requiring dialysis and/or transplant in the future.  We did discuss that living donor transplant would be the best option given his advancing age.    2.  Life-threatening hyperkalemia.      I asked him to stop spironolactone and to start a low potassium diet.  He is going to be treated with but urinary calcium for 3 days and will have follow-up laboratory studies later this week to confirm resolution of significant hyperkalemia.  I am not convinced we need to stop lisinopril yet but this may become a consideration based on follow-up laboratory studies.  To note, the aldosterone inhibition from spironolactone can persist for several weeks.     3.  Hypertension.      Blood pressure is well-controlled on amlodipine 10 mg daily, lisinopril 40 mg daily, hydralazine 100 mg twice daily, atenolol 100 mg once daily, doxazosin 4 mg daily, torsemide 40 mg daily, and spironolactone 25 mg daily.    4.  Human immunodeficiency virus disease.      He will follow-up with Dr. Ian Bushman and will maintain his current regimen of doravirine and dolutegravir.    5.  Lower urinary tract symptoms.      He is doing well on tamsulosin.    6.  Secondary/tertiary hyperparathyroidism.     His bone mineral indices are in target range.      7.  Type 2 diabetes mellitus.      He will continue to follow-up with Dr. Eston Esters excellent care. Semaglutide can likely be increased to 0.5 mg weekly at his next visit.    8.  4 mm RLL pulmonary nodule and groundglass infiltrates on chest x-ray.      This has been stable on follow-up imaging when last evaluated in May 2023.    9.  Metabolic dysfunction associated with fatty liver disease.      He was encouraged to continue exercise as tolerated as well as semaglutide therapy.    10.  Chronic low back pain.      He will continue to follow-up with [facility].    11. Peripheral neuropathy.      He is doing well on pregabalin.    12. Follow up plans.      He will start Veltassa (patiromer calcium), stop spironolactone, and have follow-up laboratory studies in 3 to 5 days.  He was again given the number to schedule the colonoscopy and sleep clinic evaluation.  I will see him back again in 3 months.      Zetta Bills. Stefano Gaul, MD  Date of service 12/29/2022

## 2023-01-05 ENCOUNTER — Encounter: Payer: Self-pay | Admitting: Internal Medicine

## 2023-01-05 ENCOUNTER — Ambulatory Visit: Payer: Medicare PPO | Attending: Otolaryngology

## 2023-01-05 DIAGNOSIS — I10 Essential (primary) hypertension: Secondary | ICD-10-CM | POA: Diagnosis not present

## 2023-01-05 DIAGNOSIS — R0683 Snoring: Secondary | ICD-10-CM | POA: Diagnosis not present

## 2023-01-05 DIAGNOSIS — R0681 Apnea, not elsewhere classified: Secondary | ICD-10-CM | POA: Insufficient documentation

## 2023-01-05 DIAGNOSIS — G4719 Other hypersomnia: Secondary | ICD-10-CM | POA: Insufficient documentation

## 2023-01-05 NOTE — Unmapped (Signed)
[facility] Refill Coordination Note    Specialty Medication(s) to be Shipped:   Infectious Disease: Pifeltro and Tivicay    Other medication(s) to be shipped: No additional medications requested for fill at this time     Samuel [name], DOB: 1950-12-06  Phone: There are no phone numbers on file.      All above HIPAA information was verified with patient.     Was a Nurse, learning disability used for this call? No    Completed refill call assessment today to schedule patient's medication shipment from the [facility] [phone]).  All relevant notes have been reviewed.     Specialty medication(s) and dose(s) confirmed: Regimen is correct and unchanged.   Changes to medications: Samuel [name] reports no changes at this time.  Changes to insurance: No  New side effects reported not previously addressed with a pharmacist or physician: None reported  Questions for the pharmacist: No    Confirmed patient received a Conservation officer, historic buildings and a Surveyor, mining with first shipment. The patient will receive a drug information handout for each medication shipped and additional FDA Medication Guides as required.       DISEASE/MEDICATION-SPECIFIC INFORMATION        N/A    SPECIALTY MEDICATION ADHERENCE     Medication Adherence    Patient reported X missed doses in the last month: 0  Specialty Medication: PIFELTRO 100 mg Tab (doravirine)  Patient is on additional specialty medications: Yes  Additional Specialty Medications: TIVICAY 50 mg TABLET (dolutegravir)  Patient Reported Additional Medication X Missed Doses in the Last Month: 0  Patient is on more than two specialty medications: No  Any gaps in refill history greater than 2 weeks in the last 3 months: no  Demonstrates understanding of importance of adherence: yes  Informant: patient  Confirmed plan for next specialty medication refill: delivery by pharmacy  Refills needed for supportive medications: not needed          Refill Coordination    Has the Patients' Contact Information Changed: No  Is the Shipping Address Different: No         Were doses missed due to medication being on hold? No    PIFELTRO 100  mg: 2 days of medicine on hand   TIVICAY 50  mg: 2 days of medicine on hand       REFERRAL TO PHARMACIST     Referral to the pharmacist: Not needed      [facility]     Shipping address confirmed in Epic.       Delivery Scheduled: Yes, Expected medication delivery date: 01/07/2023.     Medication will be delivered via Next Day Courier to the prescription address in Epic WAM.    Samuel [name]   [facility] Specialty Technician

## 2023-01-06 MED FILL — TIVICAY 50 MG TABLET: ORAL | 30 days supply | Qty: 30 | Fill #11

## 2023-01-06 MED FILL — PIFELTRO 100 MG TABLET: ORAL | 30 days supply | Qty: 30 | Fill #9

## 2023-01-29 DIAGNOSIS — B20 Human immunodeficiency virus [HIV] disease: Principal | ICD-10-CM

## 2023-01-29 MED ORDER — TIVICAY 50 MG TABLET
ORAL_TABLET | Freq: Every day | ORAL | 0 refills | 30 days | Status: CP
  Filled 2023-02-04: qty 30, 30d supply, fill #0

## 2023-01-29 NOTE — Unmapped (Signed)
Medication Requested: dolutegravir 50 mg tablet    Last visit: 08/06/2022    Future Appointments   Date Time Provider Department Center   02/04/2023  1:30 PM Hurt, Jolene Provost, MD [facility]   02/23/2023  8:30 AM Sharlett Iles, MD [facility]   03/05/2023  1:00 PM Cleaster Corin, MD [facility]     Per Provider Note:   Current regimen: Descovy (FTC/TAF) and dolutegravir   Standing order protocol requirements met?: Yes    Sent to: Pharmacy per protocol    Days Supply Given: 30 days  Number of Refills: 0

## 2023-02-02 NOTE — Unmapped (Signed)
[facility] Refill Coordination Note    Specialty Medication(s) to be Shipped:   Infectious Disease: Pifeltro and Tivicay    Other medication(s) to be shipped: No additional medications requested for fill at this time     Samuel [name], DOB: 04/28/1951  Phone: There are no phone numbers on file.      All above HIPAA information was verified with patient.     Was a Nurse, learning disability used for this call? No    Completed refill call assessment today to schedule patient's medication shipment from the [facility] [phone]).  All relevant notes have been reviewed.     Specialty medication(s) and dose(s) confirmed: Regimen is correct and unchanged.   Changes to medications: Story reports no changes at this time.  Changes to insurance: No  New side effects reported not previously addressed with a pharmacist or physician: None reported  Questions for the pharmacist: No    Confirmed patient received a Conservation officer, historic buildings and a Surveyor, mining with first shipment. The patient will receive a drug information handout for each medication shipped and additional FDA Medication Guides as required.       DISEASE/MEDICATION-SPECIFIC INFORMATION        N/A    SPECIALTY MEDICATION ADHERENCE     Medication Adherence    Specialty Medication: PIFELTRO 100 mg Tab (doravirine)  Patient is on additional specialty medications: Yes  Additional Specialty Medications: TIVICAY 50 mg TABLET (dolutegravir)  Patient Reported Additional Medication X Missed Doses in the Last Month: 0  Patient is on more than two specialty medications: No  Any gaps in refill history greater than 2 weeks in the last 3 months: no  Demonstrates understanding of importance of adherence: yes  Informant: patient  Confirmed plan for next specialty medication refill: delivery by pharmacy  Refills needed for supportive medications: not needed          Refill Coordination    Has the Patients' Contact Information Changed: No  Is the Shipping Address Different: No         Were doses missed due to medication being on hold? No    TIVICAY 50  mg: 3 days of medicine on hand   PIFELTRO 100  mg: 3 days of medicine on hand       REFERRAL TO PHARMACIST     Referral to the pharmacist: Not needed      [facility]     Shipping address confirmed in Epic.       Delivery Scheduled: Yes, Expected medication delivery date: 02/05/2023.     Medication will be delivered via Next Day Courier to the prescription address in Epic WAM.    Samuel [name]   [facility] Specialty Technician

## 2023-02-03 NOTE — Unmapped (Unsigned)
[facility]  [address]  [place], [place]  [address]  P [phone]  F [phone]     PCP:    Barbette Reichmann, MD   (hahn-DAY)    Last visit with me:  07/2022      Assessment/Plan:      HIV (dx'd 12/1999, nadir CD4 239 / 30% in 06/2011)  - chronic, stable  Current regimen: Descovy (FTC/TAF) and dolutegravir  Misses doses of ARVs rarely    ARV hx  Atripla (first regimen; rx'd 09/2007) - ~2010 - 07/2015  Descovy + Tivicay (d/t CKD) - 07/2015 - 12/2020  ABC 600/d + 3TC 100/d + DTG 50/d  - 12/2020 - 01/2021   (I didn't feel right - mostly GI symptoms)  Descovy + Tivicay - 01/2021 - 04/2022  Tivicay + Pifeltro - 04/2022 - present    Med access via Medicare  CD4 count over 300 for >2Y on suppressive ART; no prophylaxis needed; recheck CD4 annually  Discussed ARV adherence and avoiding antacids      Absolute CD4 Count   Date Value Ref Range Status   08/06/2022 770 510 - 2,320 /uL Final     CD4% (T Helper)   Date Value Ref Range Status   08/06/2022 35 34 - 58 % Final     HIV RNA Quant Result   Date Value Ref Range Status   08/06/2022 Detected (A) Not Detected Final     HIV RNA   Date Value Ref Range Status   08/06/2022 <20 (H) <0 copies/mL Final        03/2022 - Doing OK overall - tolerated the switch back to Descovy from split ABC + 3TC w/o difficulty.   07/2022 - He tolerated the switch to Pifeltro well - no issues. Takes these at nighttime, before bedtime. Looked at potential issues w/doravirine or dolutegravir with iHD, and they both seem to be OK - though dosing after HD would probably be advisable.    HIV RNA and safety labs (brief return panel)  Continue current therapy  Encouraged continued excellent       CKD stage G4/A2, pre-transplant  - chronic, progressive     eGFR   Date Value Ref Range Status   10/08/2022 19 (L) >59 mL/min/1.73 Final     Creatinine   Date Value Ref Range Status   10/08/2022 3.37 (H) 0.76 - 1.27 mg/dL Final     Cystatin C   Date Value Ref Range Status   03/12/2022 2.26 (H) 0.64 - 1.23 mg/L Final       03/2022 - Initial transplant eval 07/2020. Most recent visit w/Hladik 08/2021. Per HPI, doing well in interim. BP 133/79. Slow progression. No indication for HD. He is to complete a colonoscopy as the final phase of his transplant evaluation. Plan = RTC 65m (planned for 03/26/2022). With respect to his ARVs, the problem is that now his eGFR is consistently under 30 mL/min, and use of fixed-dose Descovy isn't recommended; the threshold for TAF by itself is 15 mL/min, (so it's OK) but for FTC it's 30 mL/min as the lower threshold before interval-spacing is required (for him, it'd be 200 mg every 72h). He's already having challenges keeping up w/his meds, so I'm concerned about making any changes at this time. I'm also a little concerned about possible influence of dolutegravir on creatinine secretion here and it maybe spuriously making his actual eGFR look worse - so checking a cystatin C seems reasonable to try to  triangulate where he is currently.   07/2022 - Cystatin C at 03/2022 visit was in agreement w/SCr for eGFR. Sat Dr Stefano Gaul in f/u most recently on 26 Jan. Per note, xp eval is ongoing and pt favors HD over PD, when time comes; holding on referral for AVF creation.  Appreciate Dr Debria Garret mgm't - continuing on current meds  No changes in ARVs needed after switch off of FTC/TAF (Descovy) in 04/2022      Liver disease  - HCV, genotype 1a, dx'd 1990s and cured s/p ELB/GRZ x12 weeks in 2017  -  RESOLVED  - Chronic transaminitis and declining platelets c/f NASH (08/2018 - present)  - chronic, stable     Lab Results   Component Value Date    AST 92 (A) 08/04/2022    AST 57 (H) 06/26/2022    AST 55 (H) 03/12/2022    AST 52 (H) 09/23/2021    AST 58 (H) 06/06/2021     Lab Results   Component Value Date    ALT 134 (A) 08/04/2022    ALT 81 (H) 06/26/2022    ALT 81 (H) 03/12/2022    ALT 106 (H) 09/23/2021    ALT 128 (H) 06/06/2021       03/2022 - Liver bx on 08Dec2006 showed grade 2 stage 2. EGD 09/2015 was w/o varices (normal plts @ time per Dr Harrel Lemon note from 04/06/2016). Because of his CKD, he was treated with 12 weeks of ELB/GRZ. HCV RNA at start of tx in 01/2016 was 328K, down to ND by 05/2016 and ND on rechecks in 07/2016, 09/2016, 11/2017, 12/2019, and 06/2021. Most recent GI f/u was in Jan 2023 [facility] NP) - per her note, Fibroscan in clinic was now F0-1 from [facility] pre-treatment. Overall assessment was for NASH. Plan = RTC 5-48m (summer 2023) - no f/u in future appts. Today, doing fine - no interval issues reported  07/2022 - No interim visits w/Liver Center. LFTs checked this past Monday by PCP and manually entered in Epic so they appear alongside other values - see above - a bit higher than most recent checks but not markedly out  Monitor LFTs over time - esp if he begins on semaglutide injections (pt mentioned this during visit)  If signif change can consider re-engagement w/Liver Center      Cardiac disease  - HFpEF (EF 60-65% on TTE 09/2019)  - chronic, stable   - CAD s/p NSTEMI 10/2015 (LHC w/ multivessel dx - no stent, med mgm't) - chronic, stable   - Aortic sclerosis  - chronic, stable   - Difficult to control HTN  - chronic, stable   03/2022 - last visit w/Cards 11/2019 Lona Millard MD). Reported 64m worsening fatigue, was referred for home sleep study given c/f OSA. Carvedilol, ASA 81, atorva 20, torsemide 20, metolazone 10, spirono 25, amlo 7.5, atenolol 100, doxazosin 4, lisino 40, hydralazine 100 bid all cont'd. Plan was RTC in 64m (early 2022) - no f/u in future appts. Today, feeling well - no CP/DOE reported. He's walking frequently but no signif other exercise.  07/2022 - saw cards fellow Dr Lona Millard on 02Feb for f/u - continuing all current meds.   Appreciate Cards' assistance  Monitor over time  Cont current meds      Pulmonary disease  - Abnormal chest CTs (4mm RLL nodule stable 03/2020 -> 07/2021, ?fibrotic changes 07/2021, 09/2021)  - chronic, stable  - 15 PY smoking hx, quit 1983  - chronic, stable  03/2022 - Saw Interventional Pulm in 09/2021. CTs reviewed, they felt the RLL nodule was of no signif concern given stability of 2Y timeline - but ordered HRCT given lower lung fibrotic vs atelectatic changes. HRCT completed 16May2023 with impression: Persistent but improved lower lung predominant subpleural reticular abnormality which may represent residual mild fibrosis, possibly from resolving organizing pneumonia. Representative cut (138/239) below. Plan for f/u not described in note.  Today, doing well - no interim issues.        07/2022 - no interim issues though he did describe briefly feeling sometimes a bit short of breath -  but nothing on a consistent basis and he's able to exercise on a regular basis (walking)    Monitor over time  Hold on referral to embedded Pulm w/Dr Sallyanne Kuster      Orthopaedic issues  - s/p ORIF R acetabular fx 1990s (@ [facility]), c/b displaced screw in quadriceps (chronic)  - chronic, stable  - s/p lumbar spinal surgery 1990s, c/b chronic LBP  - chronic, stable  - BLE numbness involving both feet, proximally to knees  - chronic, stable  03/2022 - Saw Ortho most recently 07/2020 (Obudzinski/Chen). Today, doing OK - just describes ongoing issues w/neuropathic discomfort in BLEs.  07/2022 - still describing neuropathy sxs and a general sense of heaviness in BLEs, w/o any s/sxs of claudication; he says the pregabalin is maybe helping more than the gaba had  Monitor over time  Continue pregabalin      Endocrine & metabolic issues  - T2DM without use of insulin (A1c 6.5% in 08/2021)  - chronic, stable   - Obesity (BMI 30.5)  - chronic, progressive   - Gout  - chronic, stable   - Abnormal TFTs (low TSH 10/2019 @ [facility], 03/2021 @ [facility])  - chronic, stable   - Abnormal lipids, on statin (HDL 33, LDL 36 in 03/2021 @ [facility])  - chronic, stable   - Normal 25(OH)D level 10/2019  - chronic, stable     Wt Readings from Last 4 Encounters:   09/29/22 99.2 kg (218 lb 12.8 oz)   08/06/22 97.5 kg (215 lb) 07/03/22 100.1 kg (220 lb 9.6 oz)   06/26/22 99.8 kg (220 lb)   ...  09/30/21 99.3 kg (219 lb)   11/12/20 98.2 kg (216 lb 6.4 oz)   10/10/19 (!) 104.3 kg (230 lb)   07/14/18 (!) 102.9 kg (226 lb 14.4 oz)   11/02/17 (!) 102.3 kg (225 lb 9.6 oz)      Free T4   Date Value Ref Range Status   03/12/2022 0.86 (L) 0.89 - 1.76 ng/dL Final     T3, Free   Date Value Ref Range Status   03/12/2022 3.07 2.30 - 4.20 pg/mL Final     TSH   Date Value Ref Range Status   03/12/2022 0.508 (L) 0.550 - 4.780 uIU/mL Final     Lab Results   Component Value Date    VITDTOTAL 59.4 08/06/2022        03/2022 - Re: T2DM and elevated A1c, he continues on current DM meds w/o any reported missed doses or issues. Re: weight/BMI, stable from 09/2021. Re: gout, no interval episodes. Re: thyroid symptoms, no palpitations, subjective warmth/cold, energy levels, etc.   07/2022 - TFTs from 03/2022 visit had FT4 and TSH slightly below range, FT3 WNL. No specific sxs today  Checking vit D level today  Monitor over time      Sexual health & secondary prevention  -  chronic, stable  Married (wife) .  NEED TO INQUIRE @ FUTURE RTC      Lab Results   Component Value Date    RPR Nonreactive 03/12/2022    RPR Nonreactive 01/29/2020    CTNAA Negative 06/08/2018    GCNAA Negative 06/08/2018    SPECSOURCE Urine (Male) 06/08/2018       GC/CT NAATs - not being checked routinely for this patient  RPR - needed but deferred to future visit      Health maintenance  - chronic, stable    Oral health  He does not have a dentist. Last dental exam edentulous - but dentures have loosened and needs eval. Confirmed 12Oct2023.    Eye health  He does  use corrective lenses. Last eye exam 03/11/2022. Confirmed 12Oct2023.    Metabolic conditions    Wt Readings from Last 5 Encounters:   09/29/22 99.2 kg (218 lb 12.8 oz)   08/06/22 97.5 kg (215 lb)   07/03/22 100.1 kg (220 lb 9.6 oz)   06/26/22 99.8 kg (220 lb)   03/26/22 100.2 kg (221 lb)     Lab Results   Component Value Date CREATININE 3.37 (H) 10/08/2022    PROTEINUR 6.9 03/05/2020    GLUCOSEU Negative 04/14/2016    ALBCRERAT 8.1 09/29/2022    PCRATIOUR 0.075 03/05/2020    GLU 114 09/29/2022    A1C 6.8 08/04/2022    ALT 134 (A) 08/04/2022    ALT 81 (H) 06/26/2022    ALT 81 (H) 03/12/2022    VITDTOTAL 59.4 08/06/2022     # Kidney health - defer mgm't to Nephrology  # Bone health - total vitamin D 25(OH) level and TSH and FT4  # Diabetes assessment - fasting glucose today  # NAFLD assessment - refer to Hepatology    Communicable diseases     Lab Results   Component Value Date    QFTTBGOLD Negative 01/29/2020    QTBG NEGATIVE 07/01/2011    HEPAIGG Reactive (A) 01/29/2020    HEPBSAB Nonreactive 01/29/2020    HEPBCAB Reactive (A) 01/31/2020    HEPCAB Reactive (A) 01/29/2020    HCVRNA Not Detected 06/06/2021    HCVRNAIU 328,448 (H) 02/05/2016    HCVIU 161096 08/01/2014    RUBIG Positive 01/29/2020    VZVIGG Positive 01/29/2020     # TB screening - no longer needed; negative IGRA, low risk  # Hepatitis screening - repeat HCV screen periodically  # MMR screening - no further assessment needed    Cancer screening    Lab Results   Component Value Date    PSASCRN 0.4 07/09/2010    PSA 0.35 01/29/2020     PSA screening also negative in 10/2020 ([facility] = 0.37)    # Anorectal - not yet done  # Colorectal -  FIT+DNA neg 10/2020 via PCP - but needs c-scope for transplant w/u per Dr Stefano Gaul note 08/2021  # Liver -  most recent ultrasound 18Jan2022 - neg  # Lung - screening not indicated  # Prostate - repeat PSA 21m from prior  (mid-to-late 2024)    Cardiovascular disease  Lab Results   Component Value Date    CHOL 82 (A) 08/04/2022    HDL 31 08/04/2022    LDL 19 08/04/2022    NONHDL 65 (L) 01/29/2020    TRIG 161 08/04/2022     # The ASCVD Risk score (Arnett DK, et al., 2019) failed to calculate.  - is taking aspirin   -  is taking statin  - BP control fair  - former smoker    # AAA screening - assessment needed but deferred to future visit    Immunization History   Administered Date(s) Administered    COVID-19 VAC,BIVALENT,MODERNA(BLUE CAP) 02/26/2021, 01/23/2022    COVID-19 VACCINE,MRNA(MODERNA)(PF) 08/12/2019, 03/27/2020, 07/27/2020    HEPATITIS B VACCINE ADULT, ADJUVANTED, IM(HEPLISAV B) 01/31/2020    HEPATITIS B VACCINE ADULT,IM(ENERGIX B, RECOMBIVAX) 04/06/2016, 05/21/2016, 08/24/2016    INFLUENZA INJ MDCK PF, QUAD,(FLUCELVAX)(1MO AND UP EGG FREE) 02/28/2019    INFLUENZA TIV (TRI) PF (IM) 02/08/2008, 03/26/2010, 03/25/2011    Influenza Vaccine Quad(IM)6 MO-Adult(PF) 07/24/2015, 02/05/2016, 03/08/2018, 03/05/2020, 02/26/2021    Influenza Virus Vaccine, unspecified formulation 06/15/2014, 07/24/2015, 02/05/2016, 03/15/2016, 03/31/2017    PNEUMOCOCCAL POLYSACCHARIDE 23-VALENT 07/13/2000, 08/19/2005, 12/01/2017    PPD Test 12/29/2006, 07/11/2008, 03/26/2010, 03/25/2011    Pneumococcal Conjugate 13-Valent 04/13/2012    RSV VACCINE,ADJUVANTED(PF)(AREXVY) 08/12/2022    SHINGRIX-ZOSTER VACCINE (HZV),RECOMBINANT,ADJUVANTED(IM) 09/16/2016, 12/01/2017    TdaP 08/31/2007, 08/04/2010, 10/27/2018    Tuberculin Skin Test;unspecified Formulation 12/29/2006, 07/11/2008, 03/26/2010, 03/25/2011       Immunizations needed - RSV  <-- paper Rx printed and given to him today      I personally spent 59 minutes face-to-face and non-face-to-face in the care of this patient, which includes all pre, intra, and post visit time on the date of service.      Disposition  Next appointment: 5-6 months      To do @ next RTC  Get RSV?  Semaglutide (as Ozempic)?  Sleep study?          Subjective        HPI  In addition to details in A&P above:        In back and down legs. There's another medicine they got me on, and it works some.  Takes pregabalin 75 mg BID - he feels like it's better than the gabapentin was.      Got blood draw on Monday from his PCP, and has an appt coming up on Monday.       He noticed after giving up chewing gum for Alwyn Pea that his FSBS have been improved.       Needs sleep study - not one at home.          Past Medical History:   Diagnosis Date    Abnormal EKG 11/09/2015    Acute encephalopathy 11/30/2016    Anemia in stage 3 chronic kidney disease (CMS-HCC) 01/19/2015    Last Assessment & Plan: Formatting of this note might be different from the original. # Severe iron deficiency anemia/hemoglobin- 6/ ferritin 5 [march 2017]. Status post IV iron- significant improvement of hemoglobin. Last IV Feraheme was 08/28/2015 where he received 510 mg. #Was seen by Dr. Donneta Romberg in February 2019 with complaints of worsening significant pica/fatigue/cold intolerance.  Started    Bradycardia 11/09/2015    Chronic hepatitis C without hepatic coma (CMS-HCC) 01/19/2015    CKD (chronic kidney disease) stage 3, GFR 30-59 ml/min (CMS-HCC) 01/08/2015    Coronary artery disease 10/2015    Multivessel disease, plan medical management after cath 6/17    GERD (gastroesophageal reflux disease)     Gout     HCV (hepatitis C virus) 11/22/2012    (genotype 1, HCV RNA =311,000; liver biopsy 05/2005 =chronic hepatitis grade II stage II).  Has consistently and repeatedly refused therapy.       HIV (human immunodeficiency virus infection) (CMS-HCC)     Undectable viral load  5/17    Hypertension     Nephrolithiasis     Nephrotic syndrome 03/05/2014    Neurological deficit present 11/13/2015    Weakness in lower extremities    Personal history of gout 10/27/2018    Renal mass     Followed by neprhology, MRI 8/16 improved, felt to be benign       Social History  Background - Originally from [place] Lived in [place] when he was younger. Was a coach for long-distance and high jump, triple jump.     Housing - in [place]  with wife - no pets at home  - has a son and daughter, grandkids and great-grandkids - all of them in [place] ([place], [place]) -- confirmed 03/2022  School / Work & Benefits - not in school, retired (previously worked doing ISS to help troubled youth), and on Medicare -- confirmed 03/2022    Tobacco -  15PY history of cigarettes, quit 1983  -- confirmed 03/2022  Alcohol - never drinks alcohol -- confirmed 03/2022  Substance use - previous IDU (VERY distant)  -- confirmed 03/2022      Medications and Allergies  He has a current medication list which includes the following prescription(s): accu-chek guide glucose meter, ozempic, accu-chek guide l1-l2 ctrl sol, albuterol, allopurinol, amlodipine, aspirin, atenolol, atorvastatin, onetouch verio test strips, calcium carbonate-vitamin d2, carvedilol, cholecalciferol (vitamin d3 25 mcg (1,000 units)), clonidine hcl, diazepam, dicyclomine, tivicay, doravirine, dorzolamide-timolol, ferrous sulfate, folic acid, glimepiride, hydrocodone-acetaminophen, lisinopril, nitroglycerin, nystatin, ondansetron, oxycodone-acetaminophen, pregabalin, rocklatan, sertraline, sitagliptin phosphate, spironolactone, tamsulosin, timolol, and torsemide.    Allergies: Colchicine analogues and Tramadol      Family History  His family history includes Cancer in his father and mother; Diabetes in his brother; Heart disease (age of onset: 59) in his sister; Hypertension in his mother and sister; Kidney disease in his father; Kidney failure (age of onset: 30) in his sister; Stroke in his brother; Thyroid disease in his mother.               Objective      There were no vitals taken for this visit.     Const WDWN, NAD, non-toxic appearance    Eyes lids normal bilaterally, conjunctiva anicteric and noninjected OU   PERRL    ENMT normal appearance of external nose and ears, no nasal discharge   OP clear - edentulous - dentures in upper and lower   Neck neck of normal appearance and trachea midline   no thyromegaly, nodules, or tenderness    Lymph no LAD in neck    CV RRR, no r/g, S1/S2 - difficult auscultation (distant)  no peripheral edema, WWP    Resp normal WOB   on RA, no breathlessness with speaking, no coughing, CTAB    GI normal inspection, NTND, NABS - protuberant  no umbilical hernia on exam    GU deferred   MSK no clubbing or cyanosis of hands   no focal tenderness or abnormalities of joints of RUE, LUE, RLE, or LLE    Skin no rashes, lesions, or ulcers of visualized skin   no nodules or areas of induration of palpated skin    Neuro CNs II-XII grossly intact   sensation to light touch grossly intact throughout    Psych appropriate affect   oriented to person, place, time

## 2023-02-03 NOTE — Unmapped (Incomplete)
Good to see you today!    Your most recent CD4 count was:  Absolute CD4 Count   Date Value Ref Range Status   08/06/2022 770 510 - 2,320 /uL Final     CD4 T Cell Abs   Date Value Ref Range Status   01/02/2021 860 359 - 1,519 /uL Final     As long as your CD4 count is over the 200-300 range, you're OK.      Your most recent viral load was:  HIV RNA Quant Result   Date Value Ref Range Status   08/06/2022 Detected (A) Not Detected Final     HIV RNA   Date Value Ref Range Status   08/06/2022 <20 (H) <0 copies/mL Final     Remember the goal of your treatment is to keep the amount of virus in your blood very, very low.  When you take your medications consistently, most of the time it will say Not Detected in the HIV RNA Quant Result box.  If it says Detected, then take a look at the next box that says HIV RNA and find the row that matches the date for the HIV RNA Quant Result box.  If the number is really small (less than 50), there's nothing to worry about. This means the test picked up a tiny amount of virus that doesn't mean anything bad.  If the number is bigger than 1,500, then this means that there was a substantial amount of virus in your blood AND that it was possible for you to transmit HIV to others.    If you have any questions about any of your test results, please send Dr. Ian Bushman a message in MyChart.

## 2023-02-04 MED FILL — PIFELTRO 100 MG TABLET: ORAL | 30 days supply | Qty: 30 | Fill #10

## 2023-02-28 DIAGNOSIS — B20 Human immunodeficiency virus [HIV] disease: Principal | ICD-10-CM

## 2023-02-28 MED ORDER — TIVICAY 50 MG TABLET
ORAL_TABLET | Freq: Every day | ORAL | 0 refills | 30 days | Status: CN
Start: 2023-02-28 — End: ?

## 2023-03-02 DIAGNOSIS — B20 Human immunodeficiency virus [HIV] disease: Principal | ICD-10-CM

## 2023-03-02 MED ORDER — TIVICAY 50 MG TABLET
ORAL_TABLET | Freq: Every day | ORAL | 0 refills | 30 days
Start: 2023-03-02 — End: ?

## 2023-03-02 NOTE — Unmapped (Signed)
[name] Refill Coordination Note    Specialty Medication(s) to be Shipped:   Infectious Disease: Pifeltro and Tivicay    Other medication(s) to be shipped: No additional medications requested for fill at this time     Samuel [name], DOB: 09-04-1950  Phone: [phone] (home)       All above HIPAA information was verified with patient.     Was a Nurse, learning disability used for this call? No    Completed refill call assessment today to schedule patient's medication shipment from the [facility]  [phone]).  All relevant notes have been reviewed.     Specialty medication(s) and dose(s) confirmed: Regimen is correct and unchanged.   Changes to medications: [name] no changes at this time.  Changes to insurance: No  New side effects reported not previously addressed with a pharmacist or physician: None reported  Questions for the pharmacist: No    Confirmed patient received a Conservation officer, historic buildings and a Surveyor, mining with first shipment. The patient will receive a drug information handout for each medication shipped and additional FDA Medication Guides as required.       DISEASE/MEDICATION-SPECIFIC INFORMATION        N/A    SPECIALTY MEDICATION ADHERENCE     Medication Adherence    Patient reported X missed doses in the last month: 0  Specialty Medication: PIFELTRO 100 mg Tab (doravirine)  Patient is on additional specialty medications: Yes  Additional Specialty Medications: TIVICAY 50 mg TABLET (dolutegravir)  Patient Reported Additional Medication X Missed Doses in the Last Month: 0  Patient is on more than two specialty medications: No  Any gaps in refill history greater than 2 weeks in the last 3 months: no  Demonstrates understanding of importance of adherence: yes  Informant: patient  Confirmed plan for next specialty medication refill: delivery by pharmacy  Refills needed for supportive medications: yes, ordered or provider notified Refill Coordination    Has the Patients' Contact Information Changed: No  Is the Shipping Address Different: No         Were doses missed due to medication being on hold? No    TIVICAY 50 mg: 6 days of medicine on hand   PIFELTRO 100 mg: 6 days of medicine on hand       REFERRAL TO PHARMACIST     Referral to the pharmacist: Not needed      [facility]     Shipping address confirmed in Epic.       Delivery Scheduled: Yes, Expected medication delivery date: 03/08/23.  However, Rx request for refills was sent to the provider as there are none remaining.     Medication will be delivered via Same Day Courier to the prescription address in Epic WAM.    Samuel [name]   [facility]  Specialty Technician

## 2023-03-02 NOTE — Unmapped (Signed)
Medication Requested: Samuel [name]       Future Appointments   Date Time Provider Department Center   03/05/2023  1:00 PM Cleaster Corin, MD Janeann Forehand [facility]   05/04/2023 12:00 PM Hladik, Bernita Buffy, MD Andee Lineman     Per Provider Note: Current regimen: Descovy (FTC/TAF) and dolutegravir  Misses doses of ARVs rarely    Standing order protocol requirements met?: No    Sent to: Provider for signing    Days Supply Given: 0  Number of Refills: 0

## 2023-03-04 DIAGNOSIS — Z9189 Other specified personal risk factors, not elsewhere classified: Principal | ICD-10-CM

## 2023-03-04 DIAGNOSIS — Z5181 Encounter for therapeutic drug level monitoring: Principal | ICD-10-CM

## 2023-03-04 DIAGNOSIS — B20 Human immunodeficiency virus [HIV] disease: Principal | ICD-10-CM

## 2023-03-04 DIAGNOSIS — Z113 Encounter for screening for infections with a predominantly sexual mode of transmission: Principal | ICD-10-CM

## 2023-03-04 DIAGNOSIS — Z79899 Other long term (current) drug therapy: Principal | ICD-10-CM

## 2023-03-04 MED ORDER — TIVICAY 50 MG TABLET
ORAL_TABLET | Freq: Every day | ORAL | 3 refills | 90 days | Status: CP
Start: 2023-03-04 — End: 2024-02-27
  Filled 2023-03-08: qty 30, 30d supply, fill #0

## 2023-03-04 NOTE — Unmapped (Unsigned)
DIVISION OF CARDIOLOGY  [facility] [place]        Date of Service: 03/05/2023    Follow-up Patient Clinic Note    PCP: Referring Provider:   Barbette Reichmann, MD  [address] [place] [address]  Phone: [phone]  Fax: [phone] Barbette Reichmann, MD  [address]  [place],  [place] [address]  Phone: [phone]  Fax: [phone]       Assessment and Plan:   CAD  Hx of Type II NSTEMI (11/12/15)  Coronary angiography 11/13/15 showed diffuse disease including 90% large OM without any intervention, medical therapy pursed at that time. This is suggestive of a type II event. PET stress in 2021 was normal. Continues to deny ischemic symptoms.   - Continue carvedilol 12.5mg  BID, Aspirin 81mg , and Atorvastatin 20mg   - PRN SL NTG   - LDL 42 (12/17/2022)    HFpEF  Euvolemic.   - Continue torsemide 40mg  daily and Spironolactone 25mg  daily  - GFR precludes SGLT-2    Resistant HTN  Home readings range 130-150. As this is primarily managed via nephrology, we will not make adjustments today.   - Continue amlodipine 10 mg daily, lisinopril 40 mg daily, hydralazine 100 mg BID, atenolol 100 mg daily, doxazosin 4 mg daily, torsemide 40 mg daily, and spironolactone 25 mg daily     CKD, Stage G4:A2  Currently follows with Nephrology. They have had ongoing conversations regarding the need for HD vs transplantation - no current plan for these therapies. Serial monitoring previously been recommended (April 2024).     Human immunodeficiency virus (HIV) disease:  - Continue to follow with ID    Aortic Sclerosis:  Moderately thickened leaflets with normal excursion on 09/2019 echo.      Samuel [name] will return in 6 month or sooner as needed.     The patient was seen with and evaluated by Dr. Willa Rough.      Subjective:     Chief Complaint:  Fatigue, lack of energy, dyspnea    History of Present Illness:   Samuel [name] is a 72 y.o. male with a history of CAD with recent NSTEMI 10/2015, HIV (diagnosed 12/1999), CKD, Chronic HCV, gout, nephrotic syndrome, and HTN who is presents for evaluation of worsening fatigue, decreased energy and dyspnea.      Of note, patient was admitted on 11/12/2015 to the [facility] with typical substernal chest pain described as pressure brought on by exertion, with shortness breath and diaphoresis with radiation to left arm. Labs were notable for elevated troponin, peak at 0.585 downtrending 0.433, and EKG showing T-wave inversions in the lateral leads. Echocardiography showed normal LVEF, diastolic dysfunction and moderate to severe LVH. Patient received LHC on 6/14 that showed coronary artery disease including: 40% mid LAD at the D1 bifurcation area, 30-40% mid/distal LAD, 95% stenosis in the large branching OM, 80-90% stenoses in both distal branches of the large OM, diffuse mild disease in the RCA. Decision was made to medically manage patient is discharged on DAPT with aspirin and clopidogrel in addition to Imdur 30 daily for chest pain. Patient was last seen almost 2 years ago and has canceled / rescheduled multiple follow up appointments since that time.      Interval history:  Last visit, reports he felt winded with minimal activity, low energy, and dyspnea which seems to be ongoing. Reports this is after a remote back surgery. Wife again reiterates that he  sleeps quite a bit during the day time. Denies chest pain or pressure. Has not utilized his sublingual nitroglycerine. Reports his weight has been stable at 223lbs - continues on torsemide and spironolactone. Denies orthopnea, PND, leg swelling.     Cardiovascular History & Procedures:  Cardiovascular Problems:  CAD with NSTEMI 11/12/2015  HTN    Risk Factors:  advanced age (older than 25 for men, 62 for women), hypertension and male gender    Cath / PCI:  Coronary Angiography 11/13/2015:   1. Coronary artery disease including:  a) 40% mid LAD at the D1 bifurcation area  b) 30-40% mid/distal LAD  c) 95% stenosis in the large branching OM  d) 80-90% stenoses in both distal branches of the large OM  e) Diffuse mild disease in the RCA    2. Elevated left heart filling pressure (LVEDP = 20 mmHg)   3. No intervention was made.    CV Surgery:   None    EP Procedures and Devices:  None    Non-Invasive Evaluation(s):  TTE 10/10/2019  1. The left ventricle is normal in size with mildly increased wall thickness.    2. The left ventricular systolic function is normal, LVEF is visually estimated at 60-65%.    3. There is grade I diastolic dysfunction (impaired relaxation).    4. The right ventricle is normal in size, with normal systolic function.    5. The mitral valve leaflets are mildly thickened with normal leaflet mobility.    6. The aortic valve is trileaflet with moderately thickened leaflets with normal excursion.    7. IVC size and inspiratory change suggest mildly elevated right atrial pressure. (5-10 mmHg).    TTE 11/13/2015  Left ventricular hypertrophy - moderate  Normal left ventricular systolic function, ejection fraction > 55%  Dilated left atrium - mild  Aortic sclerosis  Normal right ventricular systolic function      Medical History:  Past Medical History:   Diagnosis Date    Abnormal EKG 11/09/2015    Acute encephalopathy 11/30/2016    Anemia in stage 3 chronic kidney disease (CMS-HCC) 01/19/2015    Last Assessment & Plan: Formatting of this note might be different from the original. # Severe iron deficiency anemia/hemoglobin- 6/ ferritin 5 [march 2017]. Status post IV iron- significant improvement of hemoglobin. Last IV Feraheme was 08/28/2015 where he received 510 mg. #Was seen by Dr. Donneta Romberg in February 2019 with complaints of worsening significant pica/fatigue/cold intolerance.  Started    Bradycardia 11/09/2015    Chronic hepatitis C without hepatic coma (CMS-HCC) 01/19/2015    CKD (chronic kidney disease) stage 3, GFR 30-59 ml/min (CMS-HCC) 01/08/2015    Coronary artery disease 10/2015 Multivessel disease, plan medical management after cath 6/17    GERD (gastroesophageal reflux disease)     Gout     HCV (hepatitis C virus) 11/22/2012    (genotype 1, HCV RNA =311,000; liver biopsy 05/2005 =chronic hepatitis grade II stage II).  Has consistently and repeatedly refused therapy.       HIV (human immunodeficiency virus infection) (CMS-HCC)     Undectable viral load 5/17    Hypertension     Nephrolithiasis     Nephrotic syndrome 03/05/2014    Neurological deficit present 11/13/2015    Weakness in lower extremities    Personal history of gout 10/27/2018    Renal mass     Followed by neprhology, MRI 8/16 improved, felt to be benign       Surgical History:  Past Surgical History:   Procedure Laterality Date    BACK SURGERY      2012    PR CATH PLACE/CORON ANGIO, IMG SUPER/INTERP,W LEFT HEART VENTRICULOGRAPHY N/A 11/13/2015    Procedure: Left Heart Catheterization;  Surgeon: Orpha Bur, MD;  Location: [facility];  Service: Cardiology    PR REMOVAL OF ANAL FISSURE N/A 06/29/2014    Procedure: FISSURECTOMY, INCLUDING SPHINCTEROTOMY, WHEN PERFORMED;  Surgeon: Romero Belling, MD;  Location: [facility];  Service: Gastrointestinal    PR SURG DIAGNOSTIC EXAM, ANORECTAL N/A 06/29/2014    Procedure: ANORECTAL EXAM, SURGICAL, REQUIRING ANESTHESIA (GENERAL, SPINAL, OR EPIDURAL), DIAGNOSTIC;  Surgeon: Romero Belling, MD;  Location: [facility];  Service: Gastrointestinal       Social History:   reports that he quit smoking about 31 years ago. His smoking use included cigarettes. He started smoking about 41 years ago. He has a 10 pack-year smoking history. He has never been exposed to tobacco smoke. He has never used smokeless tobacco. He reports that he does not drink alcohol and does not use drugs.      Family History:  family history includes Cancer in his father and mother; Diabetes in his brother; Heart disease (age of onset: 80) in his sister; Hypertension in his mother and sister; Kidney disease in his father; Kidney failure (age of onset: 51) in his sister; Stroke in his brother; Thyroid disease in his mother.      Review of Systems:   Except as noted in the HPI, the remainder of 10 systems reviewed is negative.     Allergies:  Allergies   Allergen Reactions    Colchicine Analogues Diarrhea     Have diarrhea when taken for long periods of time    Tramadol Other (See Comments)     unknown tolerates morphine       Medications:   Prior to Admission medications    Medication Dose, Route, Frequency   allopurinoL (ZYLOPRIM) 100 MG tablet 250 mg, Oral, Daily (standard)   amLODIPine (NORVASC) 10 MG tablet 10 mg, Oral, Daily (standard)   aspirin (ECOTRIN) 81 MG tablet Daily (standard)   atenoloL (TENORMIN) 100 MG tablet 100 mg, Oral, Daily (standard)   carvediloL (COREG) 12.5 MG tablet 12.5 mg, 2 times a day (standard)   cloNIDine HCL (CATAPRES) 0.1 MG tablet 0.1 mg, Oral, 3 times daily (RT)   diazepam (VALIUM) 5 MG tablet 5 mg, Oral, Nightly   dicyclomine (BENTYL) 20 mg tablet    dolutegravir (TIVICAY) 50 mg TABLET 50 mg, Oral, Daily (standard)   dorzolamide-timoloL (COSOPT) 22.3-6.8 mg/mL ophthalmic solution 1 drop, Both Eyes, 2 times a day (standard)   folic acid (FOLVITE) 1 MG tablet No dose, route, or frequency recorded.   HYDROcodone-acetaminophen (NORCO) 5-325 mg per tablet Directions are take one tablet 30 minutes before physical therapy, then take one tablet at bedtime as needed for pain   lisinopriL (PRINIVIL,ZESTRIL) 40 MG tablet 40 mg, Oral, Daily (standard)   oxyCODONE-acetaminophen (PERCOCET) 5-325 mg per tablet 1 tablet, Oral, Daily   OZEMPIC 0.25 mg or 0.5 mg (2 mg/3 mL) PnIj ADMINISTER 0.5 MG UNDER THE SKIN 1 TIME A WEEK   pregabalin (LYRICA) 75 MG capsule 75 mg, Oral   ROCKLATAN 0.02-0.005 % Drop INT 1 GTT INTO OU QHS   sertraline (ZOLOFT) 50 MG tablet take 1 tablet by mouth once daily   spironolactone (ALDACTONE) 25 MG tablet 25 mg, Oral, Daily (standard), TAKE 1 TABLET(25 MG) BY MOUTH DAILY  timolol (TIMOPTIC) 0.5 % ophthalmic solution INSTILL 1 DROP IN BOTH EYES EVERY MORNING   torsemide (DEMADEX) 20 MG tablet 40 mg, Oral, Daily (standard)   ACCU-CHEK GUIDE GLUCOSE METER Misc FOLLOW PACKAGE DIRECTIONS   ACCU-CHEK GUIDE L1-L2 CTRL SOL Soln    albuterol (PROVENTIL HFA;VENTOLIN HFA) 90 mcg/actuation inhaler 2 puffs, Inhalation, Take as needed   atorvastatin (LIPITOR) 40 MG tablet 40 mg, Daily (standard)  Patient not taking: Reported on 03/05/2023   blood sugar diagnostic (ONETOUCH VERIO TEST STRIPS) Strp Use 2 (two) times daily E11.22   calcium carbonate-vitamin D2 500 mg(1,250mg ) -200 unit tablet 1 tablet, 2 times a day (standard)  Patient not taking: Reported on 03/05/2023   cholecalciferol, vitamin D3 25 mcg, 1,000 units,, 1,000 unit (25 mcg) tablet Daily (standard)  Patient not taking: Reported on 03/05/2023   doravirine 100 mg Tab 1 tablet, Oral, Daily (standard)  Patient not taking: Reported on 03/05/2023   ferrous sulfate 325 (65 FE) MG tablet 325 mg, Daily  Patient not taking: Reported on 03/05/2023   glimepiride (AMARYL) 4 MG tablet 4 mg, Oral, Daily   nitroglycerin (NITROSTAT) 0.4 MG SL tablet 0.4 mg, Sublingual, Every 5 min PRN, Maximum of 3 doses in 15 minutes.   nystatin (MYCOSTATIN) 100,000 unit/mL suspension 5 mL, Daily (standard)  Patient not taking: Reported on 07/03/2022   ondansetron (ZOFRAN) 4 MG tablet take 1 tablet by mouth every 8 hours if needed for nausea  Patient not taking: Reported on 07/03/2022   SITagliptin (JANUVIA) 50 MG tablet 50 mg, Oral, Daily (standard)   tamsulosin (FLOMAX) 0.4 mg capsule TAKE 1 CAPSULE(0.4 MG) BY MOUTH DAILY  Patient not taking: Reported on 07/03/2022         Objective:     Vitals  BP 176/94 (BP Site: L Arm, BP Position: Sitting, BP Cuff Size: Large)  - Pulse 64  - Ht 180.3 cm (5' 11)  - Wt (!) 101.2 kg (223 lb 3.2 oz)  - SpO2 94%  - BMI 31.13 kg/m??      Wt Readings from Last 3 Encounters:   03/05/23 (!) 101.2 kg (223 lb 3.2 oz)   09/29/22 99.2 kg (218 lb 12.8 oz)   08/06/22 97.5 kg (215 lb)       Physical Exam  General:  Pleasant male sitting in chair, in nad.   Neck: Supple. No JVD.   Resp:   CTAB bilaterally with normal WOB.   Cardio:  Regular, no appreciable murmur (previously II/VI in the RUSB)   Abdomen:   Soft, non-distended   Extremities: Warm well-perfused bilaterally. Trace LE edema bilaterally.       Most Recent Labs   Lab Results   Component Value Date    NA 142 10/08/2022    K 4.4 10/08/2022    CL 104 10/08/2022    CO2 23 10/08/2022     Lab Results   Component Value Date    BUN 35 (H) 10/08/2022    BUN 36 (H) 09/29/2022    BUN 16 06/26/2022    BUN 26 01/02/2021     Lab Results   Component Value Date    Creatinine 3.37 (H) 10/08/2022    Creatinine 3.51 (H) 09/29/2022    Creatinine 3.00 08/04/2022    Creatinine 2.62 (H) 01/02/2021     Lab Results   Component Value Date    PRO-BNP 158.0 03/06/2016    PRO-BNP 50.8 01/03/2016     Lab Results   Component Value Date  Cholesterol 82 (A) 08/04/2022    Cholesterol, Total 140 01/03/2014    Triglycerides 161 08/04/2022    Triglycerides 80 01/03/2014    HDL 31 08/04/2022    HDL 53 01/03/2014    Non-HDL Cholesterol 65 (L) 01/29/2020    LDL Calculated 19 08/04/2022    LDL Cholesterol, Calculated 71 01/03/2014       -----------------------------------  Cleaster Corin, MD  [facility] Adela Esteban, MD  [facility]

## 2023-03-05 ENCOUNTER — Ambulatory Visit
Admit: 2023-03-05 | Discharge: 2023-03-06 | Payer: MEDICARE | Attending: Student in an Organized Health Care Education/Training Program | Primary: Student in an Organized Health Care Education/Training Program

## 2023-03-08 MED FILL — PIFELTRO 100 MG TABLET: ORAL | 30 days supply | Qty: 30 | Fill #11

## 2023-03-09 NOTE — Unmapped (Signed)
I saw and evaluated the patient, participating in the key portions of the service and reviewing pertinent diagnostic studies and records. I reviewed the resident???s note and agree with the findings and plan.  - Edson Snowball, MD, [facility]

## 2023-03-29 DIAGNOSIS — Z79899 Other long term (current) drug therapy: Principal | ICD-10-CM

## 2023-03-29 DIAGNOSIS — B20 Human immunodeficiency virus [HIV] disease: Principal | ICD-10-CM

## 2023-03-29 DIAGNOSIS — N184 Chronic kidney disease, stage 4 (severe): Principal | ICD-10-CM

## 2023-03-29 MED ORDER — PIFELTRO 100 MG TABLET
ORAL_TABLET | Freq: Every day | ORAL | 11 refills | 30 days | Status: CP
Start: 2023-03-29 — End: ?
  Filled 2023-04-01: qty 30, 30d supply, fill #0

## 2023-03-29 NOTE — Unmapped (Signed)
Medication Requested: Pifeltro      Future Appointments   Date Time Provider Department Center   04/22/2023  2:00 PM Hurt, Jolene Provost, MD [facility]   05/04/2023 12:00 PM Sharlett Iles, MD [facility]   09/03/2023  1:00 PM Cleaster Corin, MD [facility]       Per Provider Note: Tivicay + Pifeltro - 04/2022 - present    With respect to his ARVs, the problem is that now his eGFR is consistently under 30 mL/min, and use of fixed-dose Descovy isn't recommended; the threshold for TAF by itself is 15 mL/min, (so it's OK) but for FTC it's 30 mL/min as the lower threshold before interval-spacing is required (for him, it'd be 200 mg every 72h). He's already having challenges keeping up w/his meds, so I'm concerned about making any changes at this time. I'm also a little concerned about possible influence of dolutegravir on creatinine secretion here and it maybe spuriously making his actual eGFR look worse - so checking a cystatin C seems reasonable to try to triangulate where he is currently.     Standing order protocol requirements met?: No- Sending to provider due to concerns about the patient's kidney function     Sent to: Provider for signing    Days Supply Given: TBD By provider   Number of Refills: TBD By Provider

## 2023-03-31 NOTE — Unmapped (Signed)
[facility] Clinical Assessment & Refill Coordination Note    Samuel [name], DOB: 1951/02/26  Phone: [phone] (home)     All above HIPAA information was verified with patient.     Was a Nurse, learning disability used for this call? No    Specialty Medication(s):   Infectious Disease: Pifeltro and Tivicay     Current Outpatient Medications   Medication Sig Dispense Refill    ACCU-CHEK GUIDE GLUCOSE METER Misc FOLLOW PACKAGE DIRECTIONS      ACCU-CHEK GUIDE L1-L2 CTRL SOL Soln       albuterol (PROVENTIL HFA;VENTOLIN HFA) 90 mcg/actuation inhaler Inhale 2 puffs. Take as needed      allopurinoL (ZYLOPRIM) 100 MG tablet Take 2.5 tablets (250 mg total) by mouth daily.      amLODIPine (NORVASC) 10 MG tablet Take 1 tablet (10 mg total) by mouth daily. 90 tablet 3    aspirin (ECOTRIN) 81 MG tablet daily.   0    atenoloL (TENORMIN) 100 MG tablet Take 1 tablet (100 mg total) by mouth daily. 90 tablet 3    atorvastatin (LIPITOR) 40 MG tablet Take 1 tablet (40 mg total) by mouth daily.      blood sugar diagnostic (ONETOUCH VERIO TEST STRIPS) Strp Use 2 (two) times daily E11.22      calcium carbonate-vitamin D2 500 mg(1,250mg ) -200 unit tablet Take 1 tablet (500 mg total) by mouth two (2) times a day. (Patient not taking: Reported on 03/05/2023)      carvediloL (COREG) 12.5 MG tablet 1 tablet (12.5 mg total) two (2) times a day.      cholecalciferol, vitamin D3 25 mcg, 1,000 units,, 1,000 unit (25 mcg) tablet Take by mouth daily. (Patient not taking: Reported on 03/05/2023)      cloNIDine HCL (CATAPRES) 0.1 MG tablet Take 1 tablet (0.1 mg total) by mouth 3 (three) times a day. 270 tablet 3    diazepam (VALIUM) 5 MG tablet Take 1 tablet (5 mg total) by mouth nightly.      dicyclomine (BENTYL) 20 mg tablet       dolutegravir (TIVICAY) 50 mg TABLET Take 1 tablet (50 mg total) by mouth daily. 90 tablet 3    doravirine (PIFELTRO) 100 mg Tab Take 1 tablet (100 mg total) by mouth daily. 30 tablet 11 dorzolamide-timoloL (COSOPT) 22.3-6.8 mg/mL ophthalmic solution Administer 1 drop to both eyes two (2) times a day.      ferrous sulfate 325 (65 FE) MG tablet Take 1 tablet (325 mg total) by mouth in the morning. (Patient not taking: Reported on 03/05/2023)      folic acid (FOLVITE) 1 MG tablet   0    glimepiride (AMARYL) 4 MG tablet Take 1 tablet (4 mg total) by mouth daily. 90 tablet 3    HYDROcodone-acetaminophen (NORCO) 5-325 mg per tablet Directions are take one tablet 30 minutes before physical therapy, then take one tablet at bedtime as needed for pain      lisinopriL (PRINIVIL,ZESTRIL) 40 MG tablet Take 1 tablet (40 mg total) by mouth daily. 90 tablet 3    nitroglycerin (NITROSTAT) 0.4 MG SL tablet Place 1 tablet (0.4 mg total) under the tongue every five (5) minutes as needed for chest pain. Maximum of 3 doses in 15 minutes. 25 tablet 0    nystatin (MYCOSTATIN) 100,000 unit/mL suspension Take 5 mL (500,000 Units total) by mouth daily. (Patient not taking: Reported on 07/03/2022)      ondansetron (ZOFRAN) 4 MG tablet  take 1 tablet by mouth every 8 hours if needed for nausea (Patient not taking: Reported on 07/03/2022)      oxyCODONE-acetaminophen (PERCOCET) 5-325 mg per tablet Take 1 tablet by mouth in the morning.      OZEMPIC 0.25 mg or 0.5 mg (2 mg/3 mL) PnIj ADMINISTER 0.5 MG UNDER THE SKIN 1 TIME A WEEK      pregabalin (LYRICA) 75 MG capsule Take 1 capsule (75 mg total) by mouth.      ROCKLATAN 0.02-0.005 % Drop INT 1 GTT INTO OU QHS      sertraline (ZOLOFT) 50 MG tablet take 1 tablet by mouth once daily      SITagliptin (JANUVIA) 50 MG tablet Take 1 tablet (50 mg total) by mouth daily. 90 tablet 3    spironolactone (ALDACTONE) 25 MG tablet TAKE 1 TABLET(25 MG) BY MOUTH DAILY 90 tablet 3    tamsulosin (FLOMAX) 0.4 mg capsule TAKE 1 CAPSULE(0.4 MG) BY MOUTH DAILY (Patient not taking: Reported on 07/03/2022) 90 capsule 3    timolol (TIMOPTIC) 0.5 % ophthalmic solution INSTILL 1 DROP IN BOTH EYES EVERY MORNING torsemide (DEMADEX) 20 MG tablet Take 2 tablets (40 mg total) by mouth daily. 90 tablet 3     No current facility-administered medications for this visit.        Changes to medications: Samuel [name] reports no changes at this time.    Allergies   Allergen Reactions    Colchicine Analogues Diarrhea     Have diarrhea when taken for long periods of time    Tramadol Other (See Comments)     unknown tolerates morphine       Changes to allergies: No    SPECIALTY MEDICATION ADHERENCE     Pifeltro 100 mg: 4 days of medicine on hand   Tivicay 50 mg: 4 days of medicine on hand     Medication Adherence    Patient reported X missed doses in the last month: 0  Specialty Medication: Pifeltro 100mg   Patient is on additional specialty medications: Yes  Additional Specialty Medications: Tivicay 50mg   Patient Reported Additional Medication X Missed Doses in the Last Month: 0  Patient is on more than two specialty medications: No  Any gaps in refill history greater than 2 weeks in the last 3 months: no  Demonstrates understanding of importance of adherence: yes  Informant: patient  Provider-estimated medication adherence level: good  Patient is at risk for Non-Adherence: No          Specialty medication(s) dose(s) confirmed: Regimen is correct and unchanged.     Are there any concerns with adherence? No    Adherence counseling provided? Not needed    CLINICAL MANAGEMENT AND INTERVENTION      Clinical Benefit Assessment:    Do you feel the medicine is effective or helping your condition? Yes    HIV ASSOCIATED LABS:     Lab Results   Component Value Date/Time    HIVRS Detected (A) 08/06/2022 02:15 PM    HIVRS Detected (A) 03/12/2022 10:32 AM    HIVRS Detected (A) 09/23/2021 09:29 AM    HIVRS Not Detected 08/15/2014 11:42 AM    HIVRS Not Detected 01/03/2014 10:46 AM    HIVRS Not Detected 11/23/2012 12:53 PM    HIVCP <20 (H) 08/06/2022 02:15 PM    HIVCP <20 (H) 03/12/2022 10:32 AM    HIVCP <20 (H) 09/23/2021 09:29 AM    HIVCP <40 01/02/2021 10:26 AM    HIVCP <40 05/09/2020  03:11 PM    HIVCP <40 05/16/2019 02:24 PM    ACD4 770 08/06/2022 02:15 PM    ACD4 612 09/23/2021 09:29 AM    ACD4 720 02/26/2021 10:18 AM    ACD4 514 08/01/2014 09:35 AM    ACD4 430 (L) 01/03/2014 10:46 AM    ACD4 549 11/23/2012 12:53 PM       Clinical Benefit counseling provided? Labs from 08/06/22 show evidence of clinical benefit    Adverse Effects Assessment:    Are you experiencing any side effects? No    Are you experiencing difficulty administering your medicine? No    Quality of Life Assessment:    How many days over the past month did your HIV  keep you from your normal activities? For example, brushing your teeth or getting up in the morning. 0    Have you discussed this with your provider? Not needed    Acute Infection Status:    Acute infections noted within Epic:  No active infections  Patient reported infection: None    Therapy Appropriateness:    Is therapy appropriate based on current medication list, adverse reactions, adherence, clinical benefit and progress toward achieving therapeutic goals? Yes, therapy is appropriate and should be continued     DISEASE/MEDICATION-SPECIFIC INFORMATION      N/A    HIV: Not Applicable    PATIENT SPECIFIC NEEDS     Does the patient have any physical, cognitive, or cultural barriers? No    Is the patient high risk? No    Did the patient require a clinical intervention? No    Does the patient require physician intervention or other additional services (i.e., nutrition, smoking cessation, social work)? No    SOCIAL DETERMINANTS OF HEALTH     At the [facility], we have learned that life circumstances - like trouble affording food, housing, utilities, or transportation can affect the health of many of our patients.   That is why we wanted to ask: are you currently experiencing any life circumstances that are negatively impacting your health and/or quality of life? Patient declined to answer    Social Determinants of Health     Food Insecurity: No Food Insecurity (12/24/2022)    Received from [facility]    Hunger Vital Sign     Worried About Running Out of Food in the Last Year: Never true     Ran Out of Food in the Last Year: Never true   Internet Connectivity: Not on file   Housing/Utilities: Not on file   Tobacco Use: Medium Risk (03/05/2023)    Patient History     Smoking Tobacco Use: Former     Smokeless Tobacco Use: Never     Passive Exposure: Never   Transportation Needs: No Transportation Needs (12/24/2022)    Received from [facility]    PRAPARE - Transportation     In the past 12 months, has lack of transportation kept you from medical appointments or from getting medications?: No     Lack of Transportation (Non-Medical): No   Alcohol Use: Not At Risk (09/20/2019)    Received from [facility], [facility]    AUDIT-C     Frequency of Alcohol Consumption: Never     Average Number of Drinks: Not on file     Frequency of Binge Drinking: Not on file   Interpersonal Safety: Unknown (03/31/2023)    Interpersonal Safety     Unsafe Where  You Currently Live: Not on file     Physically Hurt by Anyone: Not on file     Abused by Anyone: Not on file   Physical Activity: Inactive (09/20/2019)    Received from [facility], [facility]    Exercise Vital Sign     Days of Exercise per Week: 0 days     Minutes of Exercise per Session: 0 min   Intimate Partner Violence: Not on file   Stress: Stress Concern Present (09/20/2019)    Received from [facility] - Occupational Stress Questionnaire   Substance Use: Not on file   Social Connections: Moderately Isolated (09/20/2019)    Received from [facility], [facility]    Social Connection and Isolation Panel [NHANES]     Frequency of Communication with Friends and Family: More than three times a week     Frequency of Social Gatherings with Friends and Family: More than three times a week     Attends Religious Services: Never     Database administrator or Organizations: No     Attends Engineer, structural: Never     Marital Status: Married   Programmer, applications: Low Risk  (12/24/2022)    Received from [facility]    Overall Financial Resource Strain (CARDIA)     Difficulty of Paying Living Expenses: Not hard at all   Depression: Not at risk (12/24/2022)    Received from [facility]    PHQ-2     Patient Health Questionnaire-2 Score: 0   Health Literacy: Not on file       Would you be willing to receive help with any of the needs that you have identified today? Not applicable       SHIPPING     Specialty Medication(s) to be Shipped:   Infectious Disease: Pifeltro and Tivicay    Other medication(s) to be shipped: No additional medications requested for fill at this time     Changes to insurance: No    Delivery Scheduled: Yes, Expected medication delivery date: 04/01/23.     Medication will be delivered via Same Day Courier to the confirmed prescription address in [facility].    The patient will receive a drug information handout for each medication shipped and additional FDA Medication Guides as required.  Verified that patient has previously received a Conservation officer, historic buildings and a Surveyor, mining.    The patient or caregiver noted above participated in the development of this care plan and knows that they can request review of or adjustments to the care plan at any time.      All of the patient's questions and concerns have been addressed.    Roderic Palau, PharmD   [facility] Specialty Pharmacist

## 2023-04-01 MED FILL — TIVICAY 50 MG TABLET: ORAL | 30 days supply | Qty: 30 | Fill #1

## 2023-04-09 DIAGNOSIS — I2510 Atherosclerotic heart disease of native coronary artery without angina pectoris: Principal | ICD-10-CM

## 2023-04-27 NOTE — Unmapped (Signed)
[facility] Refill Coordination Note    Specialty Medication(s) to be Shipped:   Infectious Disease: Pifeltro and Tivicay    Other medication(s) to be shipped: No additional medications requested for fill at this time     Samuel [name], DOB: 30-Sep-1950  Phone: [phone] (home)       All above HIPAA information was verified with patient.     Was a Nurse, learning disability used for this call? No    Completed refill call assessment today to schedule patient's medication shipment from the [facility]  [phone]).  All relevant notes have been reviewed.     Specialty medication(s) and dose(s) confirmed: Regimen is correct and unchanged.   Changes to medications: Samuel [name] reports no changes at this time.  Changes to insurance: No  New side effects reported not previously addressed with a pharmacist or physician: None reported  Questions for the pharmacist: No    Confirmed patient received a Conservation officer, historic buildings and a Surveyor, mining with first shipment. The patient will receive a drug information handout for each medication shipped and additional FDA Medication Guides as required.       DISEASE/MEDICATION-SPECIFIC INFORMATION        N/A    SPECIALTY MEDICATION ADHERENCE     Medication Adherence    Patient reported X missed doses in the last month: 0  Specialty Medication: PIFELTRO 100 mg Tab (doravirine)  Patient is on additional specialty medications: Yes  Additional Specialty Medications: TIVICAY 50 mg TABLET (dolutegravir)  Patient Reported Additional Medication X Missed Doses in the Last Month: 0  Patient is on more than two specialty medications: No  Any gaps in refill history greater than 2 weeks in the last 3 months: no  Demonstrates understanding of importance of adherence: yes  Informant: patient  Confirmed plan for next specialty medication refill: delivery by pharmacy  Refills needed for supportive medications: not needed          Refill Coordination Has the Patients' Contact Information Changed: No  Is the Shipping Address Different: No         Were doses missed due to medication being on hold? No    TIVICAY 50 mg: 7 days of medicine on hand   PIFELTRO 100  mg: 7 days of medicine on hand       REFERRAL TO PHARMACIST     Referral to the pharmacist: Not needed      [facility]     Shipping address confirmed in Epic.       Delivery Scheduled: Yes, Expected medication delivery date: 05/03/23.     Medication will be delivered via Same Day Courier to the prescription address in Epic WAM.    Samuel [name]   [facility]  Specialty Technician

## 2023-04-29 NOTE — Unmapped (Addendum)
HISTORY OF PRESENT ILLNESS:      Samuel [name] is a 72 year old man with stage G4:A2 chronic kidney disease who is evaluated today for stage G4:A2 chronic kidney disease.  He has had slow progression of GFR.  Recent studies show an eGFR of about 19 mL/min.  His spouse notes that he has been somewhat more forgetful, although he is reluctant to acknowledge such. He had been treated with donepezil in the past for similar issues.  He has not had symptomatic hypoglycemia.  We again reviewed potential dialysis modalities and he favors peritoneal dialysis.  He has not had hyperkalemia after modification of diet.  He has not had increased leg edema, chest pain, PND or orthopnea.  He has not had a recent fall although he remains somewhat unsteady on his feet.  He is going to talk with his primary care physician about a potential referral to neurology to investigate entities such as normal pressure hydrocephalus.  His recent thyroid studies were acceptable.  He has not had a recent flare of gout.  He does not have dysgeusia, weight loss, chest pain, PND, or orthopnea.  He is tolerating semaglutide well at a dose of 1 mg weekly.  Recently he has had a significant deterioration in vision secondary to glaucoma and cataracts.  His surgery was delayed because of new onset of second degree Mobitz type I (Wenckebach) atrioventricular block.  He has a follow-up evaluation with cardiology next week.  Recent laboratory studies show a serum creatinine level of 3.5 mg/dL with an estimated GFR of 18 mL/min.  His most recent hemoglobin A1c returned at 6.1%.  The urine albumin to creatinine ratio was 24 mg/g.  He does not have evidence of hypervolemia, dysgeusia, chest pain, nausea, vomiting, or orthopnea.    MEDICATIONS:    Current Outpatient Medications   Medication Sig Dispense Refill    ACCU-CHEK GUIDE GLUCOSE METER Misc FOLLOW PACKAGE DIRECTIONS      ACCU-CHEK GUIDE L1-L2 CTRL SOL Soln       albuterol (PROVENTIL HFA;VENTOLIN HFA) 90 mcg/actuation inhaler Inhale 2 puffs. Take as needed      allopurinoL (ZYLOPRIM) 100 MG tablet Take 2.5 tablets (250 mg total) by mouth daily.      aspirin (ECOTRIN) 81 MG tablet daily.   0    atorvastatin (LIPITOR) 40 MG tablet Take 1 tablet (40 mg total) by mouth daily.      blood sugar diagnostic (ONETOUCH VERIO TEST STRIPS) Strp Use 2 (two) times daily E11.22      calcium carbonate-vitamin D2 500 mg(1,250mg ) -200 unit tablet Take 1 tablet (500 mg total) by mouth two (2) times a day.      cholecalciferol, vitamin D3 25 mcg, 1,000 units,, 1,000 unit (25 mcg) tablet Take by mouth daily.      diazepam (VALIUM) 5 MG tablet Take 1 tablet (5 mg total) by mouth nightly.      dicyclomine (BENTYL) 20 mg tablet       dolutegravir (TIVICAY) 50 mg TABLET Take 1 tablet (50 mg total) by mouth daily. 90 tablet 3    doravirine (PIFELTRO) 100 mg Tab Take 1 tablet (100 mg total) by mouth daily. 30 tablet 11    dorzolamide-timoloL (COSOPT) 22.3-6.8 mg/mL ophthalmic solution Administer 1 drop to both eyes two (2) times a day.      ferrous sulfate 325 (65 FE) MG tablet Take 1 tablet (325 mg total) by mouth in the morning.      folic acid (FOLVITE) 1 MG tablet  0    HYDROcodone-acetaminophen (NORCO) 5-325 mg per tablet Directions are take one tablet 30 minutes before physical therapy, then take one tablet at bedtime as needed for pain      nitroglycerin (NITROSTAT) 0.4 MG SL tablet Place 1 tablet (0.4 mg total) under the tongue every five (5) minutes as needed for chest pain. Maximum of 3 doses in 15 minutes. 25 tablet 0    nystatin (MYCOSTATIN) 100,000 unit/mL suspension Take 5 mL (500,000 Units total) by mouth daily.      ofloxacin (OCUFLOX) 0.3 % ophthalmic solution Administer 1 drop into the left eye four (4) times a day.      ondansetron (ZOFRAN) 4 MG tablet       pregabalin (LYRICA) 75 MG capsule Take 1 capsule (75 mg total) by mouth.      ROCKLATAN 0.02-0.005 % Drop INT 1 GTT INTO OU QHS      sertraline (ZOLOFT) 50 MG tablet take 1 tablet by mouth once daily      timolol (TIMOPTIC) 0.5 % ophthalmic solution INSTILL 1 DROP IN BOTH EYES EVERY MORNING      amlodipine (NORVASC) 10 MG tablet Take 1 tablet (10 mg total) by mouth daily. 90 tablet 3    atenolol (TENORMIN) 100 MG tablet Take 1 tablet (100 mg total) by mouth daily.      cloNIDine HCL (CATAPRES) 0.1 MG tablet Take 1 tablet (0.1 mg total) by mouth nightly.      glimepiride (AMARYL) 4 MG tablet Take 1 tablet (4 mg total) by mouth in the morning. 90 tablet 3    hydrALAZINE (APRESOLINE) 100 MG tablet Take 1 tablet (100 mg total) by mouth two (2) times a day.      lisinopril (PRINIVIL,ZESTRIL) 40 MG tablet Take 1 tablet (40 mg total) by mouth daily.      omega-3 fatty acids-fish oil 340-1,000 mg capsule Take 1 capsule by mouth two (2) times a day.      prednisoLONE acetate (PRED FORTE) 1 % ophthalmic suspension Administer 1 drop into the left eye four (4) times a day.      semaglutide (OZEMPIC) 1 mg/dose (2 mg/1.5 mL) PnIj Inject 1 mg under the skin every seven (7) days.      torsemide (DEMADEX) 10 MG tablet Take 2 tablets (20 mg total) by mouth daily.       No current facility-administered medications for this visit.               REVIEW OF SYSTEMS:  Constitutional: No constitutional symptoms.  No dysgeusia.  Cardiovascular: No chest pain.  Pulmonary: No cough or hemoptysis.  All other systems are reviewed and are negative except that noted in the history of present illness.    PHYSICAL EXAMINATION:      Constitutional: He is in no acute distress.  Vital signs: BP 134/72 (BP Position: Sitting, BP Cuff Size: Medium)  - Pulse 70  - Temp 37.1 ??C (98.8 ??F) (Temporal)  - Ht 180.3 cm (5' 11)  - Wt (!) 101.6 kg (224 lb)  - BMI 31.24 kg/m??   Wt Readings from Last 3 Encounters:   05/04/23 (!) 101.6 kg (224 lb)   03/05/23 (!) 101.2 kg (223 lb 3.2 oz)   09/29/22 99.2 kg (218 lb 12.8 oz)   HEENT: No oropharyngeal lesions.  Neck: Supple without lymphadenopathy or thyromegaly. No carotid bruits. Lungs: Clear to auscultation   Heart: Regular rate and rhythm. Normal S1-S2.   Abdomen: Slightly protuberant.  Soft,  nontender, normoactive bowel sounds.  Liver span is about 9 cm to percussion.  Neurologic: Alert and oriented x 3.  Strength is normal.  DTRs are 2+ and symmetric.  There are no lateralizing sensory deficits.  No asterixis.  Extremities: Trace pretibial edema.  Dorsalis pedis and posterior tibial pulses are palpable.    Node survey: No lymphadenopathy.   Musculoskeletal: No active synovitis.   Skin/Nails:  No rash or nail changes.      LABORATORY STUDIES:    Recent Results (from the past week)   Urinalysis with Microscopy    Collection Time: 05/04/23 12:39 PM   Result Value Ref Range    Color, UA Colorless     Clarity, UA Clear     Specific Gravity, UA 1.008 1.003 - 1.030    pH, UA 5.5 5.0 - 9.0    Leukocyte Esterase, UA Negative Negative    Nitrite, UA Negative Negative    Protein, UA Negative Negative    Glucose, UA Negative Negative    Ketones, UA Negative Negative    Urobilinogen, UA <2.0 mg/dL <1.6 mg/dL    Bilirubin, UA Negative Negative    Blood, UA Negative Negative    RBC, UA <1 <=3 /HPF    WBC, UA 1 <=2 /HPF    Squam Epithel, UA <1 0 - 5 /HPF    Bacteria, UA None Seen None Seen /HPF    Hyaline Casts, UA 3 (H) 0 - 1 /LPF    Mucus, UA Rare (A) None Seen /HPF   Comprehensive Metabolic Panel    Collection Time: 05/04/23 12:58 PM   Result Value Ref Range    Sodium 142 135 - 145 mmol/L    Potassium 4.5 3.4 - 4.8 mmol/L    Chloride 103 98 - 107 mmol/L    CO2 29.5 20.0 - 31.0 mmol/L    Anion Gap 10 5 - 14 mmol/L    BUN 29 (H) 9 - 23 mg/dL    Creatinine 1.09 (H) 0.73 - 1.18 mg/dL    BUN/Creatinine Ratio 9     eGFR CKD-EPI (2021) Male 19 (L) >=60 mL/min/1.72m2    Glucose 111 70 - 179 mg/dL    Calcium 60.4 (H) 8.7 - 10.4 mg/dL    Albumin 4.3 3.4 - 5.0 g/dL    Total Protein 8.6 (H) 5.7 - 8.2 g/dL    Total Bilirubin 0.9 0.3 - 1.2 mg/dL    AST 22 <=54 U/L    ALT 34 10 - 49 U/L    Alkaline Phosphatase 71 46 - 116 U/L   CBC w/ Differential    Collection Time: 05/04/23 12:58 PM   Result Value Ref Range    WBC 9.3 3.6 - 11.2 10*9/L    RBC 4.96 4.26 - 5.60 10*12/L    HGB 15.0 12.9 - 16.5 g/dL    HCT 09.8 11.9 - 14.7 %    MCV 90.5 77.6 - 95.7 fL    MCH 30.2 25.9 - 32.4 pg    MCHC 33.4 32.0 - 36.0 g/dL    RDW 82.9 56.2 - 13.0 %    MPV 9.3 6.8 - 10.7 fL    Platelet 117 (L) 150 - 450 10*9/L    Neutrophils % 65.1 %    Lymphocytes % 24.2 %    Monocytes % 9.4 %    Eosinophils % 0.9 %    Basophils % 0.4 %    Absolute Neutrophils 6.1 1.8 - 7.8 10*9/L    Absolute Lymphocytes  2.3 1.1 - 3.6 10*9/L    Absolute Monocytes 0.9 (H) 0.3 - 0.8 10*9/L    Absolute Eosinophils 0.1 0.0 - 0.5 10*9/L    Absolute Basophils 0.0 0.0 - 0.1 10*9/L         03/24/2022    Sodium 142 potassium 4.7 chloride 107 total CO2 27 BUN 31 creatinine 3.0 eGFR 25 glucose 109 calcium 10.5 AST 75 ALT 111 alk phos 77 albumin 4.7 bilirubin 0.7 total protein 7.6    White count 11.6 hemoglobin 14.8 platelets 145    Cholesterol 86 triglycerides 85 HDL cholesterol 33 LDL 36    Urine albumin to creatinine ratio 121 mg/g    Hemoglobin A1c 5.6    TSH 0.311    Serum creatinine trend:    Date Value   04/07/2023 3.50 (H)   03/24/2022 3.00 (H)   03/12/2022 2.62 (H)   09/23/2021 3.26 (H)   06/06/2021 2.81 (H)   02/26/2021 2.18 (H)   01/29/2021 2.63 (H)   01/02/2021 2.62 (H)   11/08/2020 2.70 (H)     Imaging:    Chest CT 06/24/2021    Chest CT shows stability of the right pulmonary nodule.  However, it showed increased bilateral lower lobe groundglass and reticular abnormality may represent changes of infectious or organizing pneumonia (including autoimmune or medication-related etiologies).      IMPRESSION AND PLAN:    1.  Stage G4/A2 chronic kidney disease.  His kidney disease continues to progress slowly.  He does not have indications for dialysis at this time.  At this time, he favors peritoneal dialysis if and when he requires dialysis.  I doubt he is a candidate for deceased donor transplantation.  The onset of some degree of cognitive dysfunction is of concern.  This merits further evaluation.  I doubt the recent change in cognitive function can be attributed to kidney disease alone since his renal function has remained somewhat static despite the change in mentation noted by his spouse.    2. Cognitive impairment. I suspect that this is multifactorial in etiology.  Certainly his kidney dysfunction is a likely contributor, although it is unlikely that this is the main driver since his renal function has been relatively stable despite progression of his neurologic symptoms.  As a first step, polypharmacy could be addressed by a slow taper of pregabalin, clonidine, diazepam, and hydrocodone if possible.  He likely merits a full evaluation given that his spouse has noted a significant change over the past 6 months.  They are going to talk about a referral to neurology with Dr. Marcello Fennel.  I would favor an MRI to evaluate for normal pressure hydrocephalus and more comprehensive cognitive assessment.    3.  History of hyperkalemia.  This resolved after discontinuation of spironolactone and dietary modification.      4.  Hypertension.  His blood pressure is reasonably well-controlled.    5.  Human immunodeficiency virus disease.  He will follow-up with Dr. Ian Bushman later this month.    6.  Secondary/tertiary hyperparathyroidism.  A follow-up PTH has been requested.  He has had mild hypercalcemia over time.  Prior screening for monoclonal gammopathy was negative in 2021.    7.  Type 2 diabetes mellitus.  He will continue semaglutide 1 mg weekly.    8.  Metabolic dysfunction associated with fatty liver disease.  Continue semaglutide.    9.  Chronic low back pain.  He has ongoing follow-up with his pain specialist.    10. Peripheral neuropathy.  It is conceivable that the pregabalin could be contributing to his current symptoms.  He is going to work with Dr. Marcello Fennel to help address polypharmacy.    11. Second degree Mobitz type I (Wenckebach) atrioventricular block.  He likely has structural cardiac abnormalities secondary to longstanding hypertension.  The heart block is likely compounded by the combination of beta-blockade and clonidine.  My recommendation is to discontinue clonidine. The dose of atenolol could also be potentially tapered.     12. Follow up plans.  He is going to talk with Dr. Marcello Fennel about possibly seeing a neurologist.  I called their office and he has been referred twice since 2020 and the patient declined the consultation. Ongoing work to taper off centrally acting agents is indicated.  I like to see him back again in about 3 to 4 months to reassess his kidney function.    Recommendations:    1.  Return in 3 to 4 months to reassess kidney function.  Will continue ongoing discussions regarding peritoneal dialysis.  2.  Discontinue clonidine.  3.  Work with Dr. Marcello Fennel and his pain management team to taper or minimize centrally acting agents.  4.  Consider referral to neurology for evaluation of cognitive impairment    Earvin Hansen A. Stefano Gaul, MD  Date of service 05/04/2023

## 2023-05-03 MED FILL — PIFELTRO 100 MG TABLET: ORAL | 30 days supply | Qty: 30 | Fill #1

## 2023-05-03 MED FILL — TIVICAY 50 MG TABLET: ORAL | 30 days supply | Qty: 30 | Fill #2

## 2023-05-04 ENCOUNTER — Ambulatory Visit: Admit: 2023-05-04 | Discharge: 2023-05-04 | Payer: MEDICARE | Attending: Nephrology | Primary: Nephrology

## 2023-05-04 ENCOUNTER — Ambulatory Visit: Admit: 2023-05-04 | Discharge: 2023-05-04 | Payer: MEDICARE

## 2023-05-04 DIAGNOSIS — I5033 Acute on chronic diastolic (congestive) heart failure: Principal | ICD-10-CM

## 2023-05-04 DIAGNOSIS — N184 Chronic kidney disease, stage 4 (severe): Principal | ICD-10-CM

## 2023-05-04 DIAGNOSIS — I214 Non-ST elevation (NSTEMI) myocardial infarction: Principal | ICD-10-CM

## 2023-05-04 DIAGNOSIS — I2510 Atherosclerotic heart disease of native coronary artery without angina pectoris: Principal | ICD-10-CM

## 2023-05-04 DIAGNOSIS — I5032 Chronic diastolic (congestive) heart failure: Principal | ICD-10-CM

## 2023-05-04 DIAGNOSIS — E1140 Type 2 diabetes mellitus with diabetic neuropathy, unspecified: Principal | ICD-10-CM

## 2023-05-04 DIAGNOSIS — R4189 Other symptoms and signs involving cognitive functions and awareness: Principal | ICD-10-CM

## 2023-05-04 DIAGNOSIS — K7581 Nonalcoholic steatohepatitis (NASH): Principal | ICD-10-CM

## 2023-05-04 DIAGNOSIS — B20 Human immunodeficiency virus [HIV] disease: Principal | ICD-10-CM

## 2023-05-04 DIAGNOSIS — E8770 Fluid overload, unspecified: Principal | ICD-10-CM

## 2023-05-04 DIAGNOSIS — M1000 Idiopathic gout, unspecified site: Principal | ICD-10-CM

## 2023-05-04 DIAGNOSIS — I151 Hypertension secondary to other renal disorders: Principal | ICD-10-CM

## 2023-05-04 DIAGNOSIS — N3000 Acute cystitis without hematuria: Principal | ICD-10-CM

## 2023-05-04 LAB — CBC W/ AUTO DIFF
BASOPHILS ABSOLUTE COUNT: 0 10*9/L (ref 0.0–0.1)
BASOPHILS RELATIVE PERCENT: 0.4 %
EOSINOPHILS ABSOLUTE COUNT: 0.1 10*9/L (ref 0.0–0.5)
EOSINOPHILS RELATIVE PERCENT: 0.9 %
HEMATOCRIT: 44.9 % (ref 39.0–48.0)
HEMOGLOBIN: 15 g/dL (ref 12.9–16.5)
LYMPHOCYTES ABSOLUTE COUNT: 2.3 10*9/L (ref 1.1–3.6)
LYMPHOCYTES RELATIVE PERCENT: 24.2 %
MEAN CORPUSCULAR HEMOGLOBIN CONC: 33.4 g/dL (ref 32.0–36.0)
MEAN CORPUSCULAR HEMOGLOBIN: 30.2 pg (ref 25.9–32.4)
MEAN CORPUSCULAR VOLUME: 90.5 fL (ref 77.6–95.7)
MEAN PLATELET VOLUME: 9.3 fL (ref 6.8–10.7)
MONOCYTES ABSOLUTE COUNT: 0.9 10*9/L — ABNORMAL HIGH (ref 0.3–0.8)
MONOCYTES RELATIVE PERCENT: 9.4 %
NEUTROPHILS ABSOLUTE COUNT: 6.1 10*9/L (ref 1.8–7.8)
NEUTROPHILS RELATIVE PERCENT: 65.1 %
PLATELET COUNT: 117 10*9/L — ABNORMAL LOW (ref 150–450)
RED BLOOD CELL COUNT: 4.96 10*12/L (ref 4.26–5.60)
RED CELL DISTRIBUTION WIDTH: 14.4 % (ref 12.2–15.2)
WBC ADJUSTED: 9.3 10*9/L (ref 3.6–11.2)

## 2023-05-04 LAB — URINALYSIS WITH MICROSCOPY
BACTERIA: NONE SEEN /HPF
BILIRUBIN UA: NEGATIVE
BLOOD UA: NEGATIVE
GLUCOSE UA: NEGATIVE
HYALINE CASTS: 3 /LPF — ABNORMAL HIGH (ref 0–1)
KETONES UA: NEGATIVE
LEUKOCYTE ESTERASE UA: NEGATIVE
NITRITE UA: NEGATIVE
PH UA: 5.5 (ref 5.0–9.0)
PROTEIN UA: NEGATIVE
RBC UA: <1 /HPF (ref <=3)
SPECIFIC GRAVITY UA: 1.008 (ref 1.003–1.030)
SQUAMOUS EPITHELIAL: <1 /HPF (ref 0–5)
UROBILINOGEN UA: <2
WBC UA: 1 /HPF (ref <=2)

## 2023-05-04 LAB — LYMPH MARKER LIMITED,FLOW
ABSOLUTE CD3 CNT: 1702 {cells}/uL (ref 915–3400)
ABSOLUTE CD4 CNT: 782 {cells}/uL (ref 510–2320)
ABSOLUTE CD8 CNT: 897 {cells}/uL (ref 180–1520)
CD3% (T CELLS): 74 % (ref 61–86)
CD4% (T HELPER): 34 % (ref 34–58)
CD4:CD8 RATIO: 0.9 (ref 0.9–4.8)
CD8% T SUPPRESR: 39 % — ABNORMAL HIGH (ref 12–38)

## 2023-05-04 LAB — COMPREHENSIVE METABOLIC PANEL
ALBUMIN: 4.3 g/dL (ref 3.4–5.0)
ALKALINE PHOSPHATASE: 71 U/L (ref 46–116)
ALT (SGPT): 34 U/L (ref 10–49)
ANION GAP: 10 mmol/L (ref 5–14)
AST (SGOT): 22 U/L (ref <=34)
BILIRUBIN TOTAL: 0.9 mg/dL (ref 0.3–1.2)
BLOOD UREA NITROGEN: 29 mg/dL — ABNORMAL HIGH (ref 9–23)
BUN / CREAT RATIO: 9
CALCIUM: 11.3 mg/dL — ABNORMAL HIGH (ref 8.7–10.4)
CHLORIDE: 103 mmol/L (ref 98–107)
CO2: 29.5 mmol/L (ref 20.0–31.0)
CREATININE: 3.26 mg/dL — ABNORMAL HIGH (ref 0.73–1.18)
EGFR CKD-EPI (2021) MALE: 19 mL/min/{1.73_m2} — ABNORMAL LOW (ref >=60)
GLUCOSE RANDOM: 111 mg/dL (ref 70–179)
POTASSIUM: 4.5 mmol/L (ref 3.4–4.8)
PROTEIN TOTAL: 8.6 g/dL — ABNORMAL HIGH (ref 5.7–8.2)
SODIUM: 142 mmol/L (ref 135–145)

## 2023-05-04 LAB — HIV RNA, QUANTITATIVE, PCR
HIV RNA QNT RSLT: DETECTED — AB
HIV RNA: <20 {copies}/mL — ABNORMAL HIGH (ref <0)

## 2023-05-04 LAB — PARATHYROID HORMONE (PTH): PARATHYROID HORMONE INTACT: 63.2 pg/mL (ref 18.5–88.1)

## 2023-05-04 LAB — VITAMIN B12: VITAMIN B-12: 4632 pg/mL — ABNORMAL HIGH (ref 211–911)

## 2023-05-04 NOTE — Unmapped (Addendum)
Positive, everyday actions can make a difference in brain health, even lowering the risk of cognitive decline and possibly Alzheimer's and dementia. Incorporate some or all of these habits into your life to help maintain a healthy brain.    Take charge of your brain health today -- it's never too early or too late to start.    Be curious! Put your brain to work and do something that is new for you. Learn a new skill. Try something artistic. Challenging your mind may have short- and long-term benefits for your brain.  Stay in school. Education reduces the risk of cognitive decline and dementia. Encourage youth to stay in school and pursue the highest level of training possible. Continue your own education by taking a class at a [employer] or college, or online.  3.   Get moving. Engage in regular exercise. This includes activities that raise your heart rate and increase blood flow to the brain and body. Find ways to build more movement into your day -- walking, dancing, gardening -- whatever works for you!  4.   Protect your head. Help prevent an injury to your head. Wear a helmet for activities like biking, and wear a seatbelt. Protect yourself while playing sports. Do what you can to prevent falls, especially for older adults.  5.   Be smoke-free. Quitting smoking can lower the risk of cognitive decline back to levels similar to those who have not smoked. It's never too late to stop.  6.   Control your blood pressure.  Medications can help lower high blood pressure. And healthy habits like eating right and physical activity can help, too. Work with a health care provider to control your blood pressure.  7.  Type 2 diabetes can be prevented or controlled by eating healthier, increasing physical activity and taking medication, if necessary.    8.  Eat right.  Eating healthier foods can help reduce your risk of cognitive decline. This includes more vegetables (green leafy vegetables) and leaner meats/proteins, along with foods that are less processed and lower in fat. Choose healthier meals and snacks that you enjoy and are available to you.    9.  Maintain a healthy weight.  Talk to your health care provider about the weight that is healthy for you. Other healthy habits on this list -- eating right, exercising and sleeping well -- can help with maintaining a healthy weight.    10.  Sleep well.  Good quality sleep is important for brain health. Stay off screens before bed and make your sleep space as comfortable as possible. Do all you can to minimize disruptions. If you have any sleep-related problems, such as sleep apnea, talk to a health care provider.        Labs today-I will call you with results

## 2023-05-05 DIAGNOSIS — E1169 Type 2 diabetes mellitus with other specified complication: Principal | ICD-10-CM

## 2023-05-05 DIAGNOSIS — E8770 Fluid overload, unspecified: Principal | ICD-10-CM

## 2023-05-05 DIAGNOSIS — I5033 Acute on chronic diastolic (congestive) heart failure: Principal | ICD-10-CM

## 2023-05-05 LAB — SYPHILIS SCREEN: SYPHILIS RPR SCREEN: NONREACTIVE

## 2023-05-07 ENCOUNTER — Ambulatory Visit
Admit: 2023-05-07 | Discharge: 2023-05-08 | Payer: MEDICARE | Attending: Student in an Organized Health Care Education/Training Program | Primary: Student in an Organized Health Care Education/Training Program

## 2023-05-07 ENCOUNTER — Ambulatory Visit: Admit: 2023-05-07 | Discharge: 2023-05-08 | Payer: MEDICARE

## 2023-05-07 DIAGNOSIS — I214 Non-ST elevation (NSTEMI) myocardial infarction: Principal | ICD-10-CM

## 2023-05-07 DIAGNOSIS — Z01810 Encounter for preprocedural cardiovascular examination: Principal | ICD-10-CM

## 2023-05-07 DIAGNOSIS — I151 Hypertension secondary to other renal disorders: Principal | ICD-10-CM

## 2023-05-07 DIAGNOSIS — I2510 Atherosclerotic heart disease of native coronary artery without angina pectoris: Principal | ICD-10-CM

## 2023-05-07 DIAGNOSIS — I5032 Chronic diastolic (congestive) heart failure: Principal | ICD-10-CM

## 2023-05-07 NOTE — Unmapped (Signed)
DIVISION OF CARDIOLOGY  [facility], [place]        Date of Service: 05/07/2023    Follow-up Patient Clinic Note    PCP: Referring Provider:   Barbette Reichmann, MD  [address] [place] [address]  Phone: [phone]  Fax: [phone] Barbette Reichmann, MD  [address]  [place],  [place] [address]  Phone: [phone]  Fax: [phone]       Assessment and Plan:   CAD  Hx of Type II NSTEMI (11/12/15)  Coronary angiography 11/13/15 showed diffuse disease including 90% large OM without any intervention, medical therapy pursed at that time. This is suggestive of a type II event. PET stress in 2021 was normal. Continues to deny ischemic symptoms.   -Reduce atenolol to Atenonlol 100mg  daily given bradycardia previously at 150 mg daily  -Continue aspirin 81mg , and Atorvastatin 40mg   - PRN SL NTG   - LDL 42 (12/17/2022)    HFpEF  Euvolemic.   - Continue torsemide 40mg  daily and Spironolactone 25mg  daily  - GFR precludes SGLT-2    Resistant HTN  Home readings range 130-150. As this is primarily managed via nephrology, we will not make adjustments today.   - Continue amlodipine 10 mg daily, lisinopril 40 mg daily, hydralazine 100 mg BID, atenolol 100 mg daily, doxazosin 4 mg daily, torsemide 40 mg daily, and spironolactone 25 mg daily     CKD, Stage G4:A2  Currently follows with Nephrology. They have had ongoing conversations regarding the need for HD vs transplantation - no current plan for these therapies. Serial monitoring previously been recommended (April 2024).     Human immunodeficiency virus (HIV) disease:  - Continue to follow with ID    Aortic Sclerosis:  Moderately thickened leaflets with normal excursion on 09/2019 echo.    Bradycardia, type II AV block (Mobitz 1)  This is currently considered to be a benign arrhythmia, and likely an age-related phenomena.  Mobitz 1 currently does not require additional intervention as he is currently asymptomatic.  Will continue to monitor this over time.  We will also decrease his nodal blockade which may be contributing to slower heart rates.  - Decrease atenolol to 100 mg daily from 150 mg daily    Pre-Op  Pre-Operative Evaluation: Pre-operative evaluation has been requested by Barbette Reichmann, MD for Cataract which is considered a low-risk procedure (endoscopy, superficial procedure, cataract, breast, ambulatory). The patient's RCRI (Revised Cardiac Risk Index) risk factors include ischemic heart disease (history MI, positive stress test, current chest pain, use of nitrates, EKG with Q waves) and hx CHF and therefore the 30-day risk of a cardiac event is 10.1% (2 risk factors). The patient reports their functional capacity is poor (unable to walk 2+ blocks on ground level without stopping).    As this is considered a low risk procedure, it is reasonable to proceed with operative intervention as this seems to be impacting his quality of life acknowledging that he does have underlying cardiovascular disease which does not warrant additional intervention prior to this procedure.  -Proceed with ophthalmologic care as planned        Samuel [name] will return in 6 month or sooner as needed.     The patient was seen with and evaluated by Dr. Adam Phenix.      Subjective:     Chief Complaint:  Fatigue, lack of energy, dyspnea    History of Present Illness:   Samuel [name] is a 72 y.o. male with a history of CAD with recent NSTEMI 10/2015, HIV (diagnosed 12/1999), CKD, Chronic HCV, gout, nephrotic syndrome, and HTN who is presents for evaluation of worsening fatigue, decreased energy and dyspnea.      Of note, patient was admitted on 11/12/2015 to the [facility] with typical substernal chest pain described as pressure brought on by exertion, with shortness breath and diaphoresis with radiation to left arm. Labs were notable for elevated troponin, peak at 0.585 downtrending 0.433, and EKG showing T-wave inversions in the lateral leads. Echocardiography showed normal LVEF, diastolic dysfunction and moderate to severe LVH. Patient received LHC on 6/14 that showed coronary artery disease including: 40% mid LAD at the D1 bifurcation area, 30-40% mid/distal LAD, 95% stenosis in the large branching OM, 80-90% stenoses in both distal branches of the large OM, diffuse mild disease in the RCA. Decision was made to medically manage patient is discharged on DAPT with aspirin and clopidogrel in addition to Imdur 30 daily for chest pain. Patient was last seen almost 2 years ago and has canceled / rescheduled multiple follow up appointments since that time.      Interval history:  Reports he has chronic dyspnea on exertion (>10 years) after a back surgery which has limited his functional capacity. Since, has been deconditioned and cannot walk long distances. Able to sleep flat, no leg swelling, no PND,  No chest pains.       Cardiovascular History & Procedures:  Cardiovascular Problems:  CAD with NSTEMI 11/12/2015  HTN    Risk Factors:  advanced age (older than 62 for men, 67 for women), hypertension and male gender    Cath / PCI:  Coronary Angiography 11/13/2015:   1. Coronary artery disease including:  a) 40% mid LAD at the D1 bifurcation area  b) 30-40% mid/distal LAD  c) 95% stenosis in the large branching OM  d) 80-90% stenoses in both distal branches of the large OM  e) Diffuse mild disease in the RCA    2. Elevated left heart filling pressure (LVEDP = 20 mmHg)   3. No intervention was made.    CV Surgery:   None    EP Procedures and Devices:  None    Non-Invasive Evaluation(s):  TTE 10/10/2019  1. The left ventricle is normal in size with mildly increased wall thickness.    2. The left ventricular systolic function is normal, LVEF is visually estimated at 60-65%.    3. There is grade I diastolic dysfunction (impaired relaxation).    4. The right ventricle is normal in size, with normal systolic function.    5. The mitral valve leaflets are mildly thickened with normal leaflet mobility.    6. The aortic valve is trileaflet with moderately thickened leaflets with normal excursion.    7. IVC size and inspiratory change suggest mildly elevated right atrial pressure. (5-10 mmHg).    TTE 11/13/2015  Left ventricular hypertrophy - moderate  Normal left ventricular systolic function, ejection fraction > 55%  Dilated left atrium - mild  Aortic sclerosis  Normal right ventricular systolic function      Medical History:  Past Medical History:   Diagnosis Date    Abnormal EKG 11/09/2015    Acute encephalopathy 11/30/2016    Anemia in stage 3 chronic kidney disease (CMS-HCC) 01/19/2015    Last Assessment & Plan: Formatting of this note might be different from the original. # Severe iron deficiency anemia/hemoglobin- 6/ ferritin 5 [march 2017]. Status post  IV iron- significant improvement of hemoglobin. Last IV Feraheme was 08/28/2015 where he received 510 mg. #Was seen by Dr. Donneta Romberg in February 2019 with complaints of worsening significant pica/fatigue/cold intolerance.  Started    Bradycardia 11/09/2015    Chronic hepatitis C without hepatic coma (CMS-HCC) 01/19/2015    CKD (chronic kidney disease) stage 3, GFR 30-59 ml/min (CMS-HCC) 01/08/2015    Coronary artery disease 10/2015    Multivessel disease, plan medical management after cath 6/17    GERD (gastroesophageal reflux disease)     Gout     HCV (hepatitis C virus) 11/22/2012    (genotype 1, HCV RNA =311,000; liver biopsy 05/2005 =chronic hepatitis grade II stage II).  Has consistently and repeatedly refused therapy.       HIV (human immunodeficiency virus infection) (CMS-HCC)     Undectable viral load 5/17    Hypertension     Nephrolithiasis     Nephrotic syndrome 03/05/2014    Neurological deficit present 11/13/2015    Weakness in lower extremities    Personal history of gout 10/27/2018    Renal mass     Followed by neprhology, MRI 8/16 improved, felt to be benign       Surgical History:  Past Surgical History:   Procedure Laterality Date    BACK SURGERY      2012    PR CATH PLACE/CORON ANGIO, IMG SUPER/INTERP,W LEFT HEART VENTRICULOGRAPHY N/A 11/13/2015    Procedure: Left Heart Catheterization;  Surgeon: Orpha Bur, MD;  Location: [facility] CATH;  Service: Cardiology    PR REMOVAL OF ANAL FISSURE N/A 06/29/2014    Procedure: FISSURECTOMY, INCLUDING SPHINCTEROTOMY, WHEN PERFORMED;  Surgeon: Romero Belling, MD;  [facility];  Service: Gastrointestinal    PR SURG DIAGNOSTIC EXAM, ANORECTAL N/A 06/29/2014    Procedure: ANORECTAL EXAM, SURGICAL, REQUIRING ANESTHESIA (GENERAL, SPINAL, OR EPIDURAL), DIAGNOSTIC;  Surgeon: Romero Belling, MD;  [facility]: [facility];  Service: Gastrointestinal       Social History:   reports that he quit smoking about 31 years ago. His smoking use included cigarettes. He started smoking about 41 years ago. He has a 10 pack-year smoking history. He has never been exposed to tobacco smoke. He has never used smokeless tobacco. He reports that he does not drink alcohol and does not use drugs.      Family History:  family history includes Cancer in his father and mother; Diabetes in his brother; Heart disease (age of onset: 61) in his sister; Hypertension in his mother and sister; Kidney disease in his father; Kidney failure (age of onset: 55) in his sister; Stroke in his brother; Thyroid disease in his mother.      Review of Systems:   Except as noted in the HPI, the remainder of 10 systems reviewed is negative.     Allergies:  Allergies   Allergen Reactions    Colchicine Analogues Diarrhea     Have diarrhea when taken for long periods of time    Tramadol Other (See Comments)     unknown tolerates morphine       Medications:   Prior to Admission medications    Medication Dose, Route, Frequency   ACCU-CHEK GUIDE GLUCOSE METER Misc FOLLOW PACKAGE DIRECTIONS   ACCU-CHEK GUIDE L1-L2 CTRL SOL Soln    albuterol (PROVENTIL HFA;VENTOLIN HFA) 90 mcg/actuation inhaler 2 puffs   allopurinoL (ZYLOPRIM) 100 MG tablet 250 mg, Daily (standard)   amlodipine (NORVASC) 10 MG tablet 10 mg, Oral, Daily (standard)  aspirin (ECOTRIN) 81 MG tablet Daily (standard)   atenolol (TENORMIN) 100 MG tablet 100 mg, Oral, Daily (standard)   atorvastatin (LIPITOR) 40 MG tablet 40 mg, Daily (standard)   blood sugar diagnostic (ONETOUCH VERIO TEST STRIPS) Strp Use 2 (two) times daily E11.22   calcium carbonate-vitamin D2 500 mg(1,250mg ) -200 unit tablet 1 tablet, 2 times a day (standard)   cholecalciferol, vitamin D3 25 mcg, 1,000 units,, 1,000 unit (25 mcg) tablet Daily (standard)   cloNIDine HCL (CATAPRES) 0.1 MG tablet 0.1 mg, Oral, Nightly   diazepam (VALIUM) 5 MG tablet 5 mg, Nightly   dicyclomine (BENTYL) 20 mg tablet    dolutegravir (TIVICAY) 50 mg TABLET 50 mg, Oral, Daily (standard)   doravirine (PIFELTRO) 100 mg Tab 1 tablet, Oral, Daily (standard)   dorzolamide-timoloL (COSOPT) 22.3-6.8 mg/mL ophthalmic solution 1 drop, 2 times a day (standard)   ferrous sulfate 325 (65 FE) MG tablet 325 mg, Daily   folic acid (FOLVITE) 1 MG tablet No dose, route, or frequency recorded.   glimepiride (AMARYL) 4 MG tablet 4 mg, Oral, Daily   hydrALAZINE (APRESOLINE) 100 MG tablet 100 mg, Oral, 2 times a day (standard)   HYDROcodone-acetaminophen (NORCO) 5-325 mg per tablet Directions are take one tablet 30 minutes before physical therapy, then take one tablet at bedtime as needed for pain   lisinopril (PRINIVIL,ZESTRIL) 40 MG tablet 40 mg, Oral, Daily (standard)   nystatin (MYCOSTATIN) 100,000 unit/mL suspension 5 mL, Daily (standard)   ofloxacin (OCUFLOX) 0.3 % ophthalmic solution 1 drop, 4 times a day   omega-3 fatty acids-fish oil 340-1,000 mg capsule 1 capsule, 2 times a day (standard)   ondansetron (ZOFRAN) 4 MG tablet    prednisoLONE acetate (PRED FORTE) 1 % ophthalmic suspension 1 drop, Left Eye, 4 times a day   pregabalin (LYRICA) 75 MG capsule 75 mg   ROCKLATAN 0.02-0.005 % Drop INT 1 GTT INTO OU QHS   semaglutide (OZEMPIC) 1 mg/dose (2 mg/1.5 mL) PnIj 1 mg, Subcutaneous, Every 7 days   sertraline (ZOLOFT) 50 MG tablet take 1 tablet by mouth once daily   timolol (TIMOPTIC) 0.5 % ophthalmic solution INSTILL 1 DROP IN BOTH EYES EVERY MORNING   torsemide (DEMADEX) 10 MG tablet 20 mg, Oral, Daily (standard)   nitroglycerin (NITROSTAT) 0.4 MG SL tablet 0.4 mg, Sublingual, Every 5 min PRN, Maximum of 3 doses in 15 minutes.  Patient not taking: Reported on 05/07/2023         Objective:     Vitals  BP 142/80 (BP Site: L Arm, BP Position: Sitting, BP Cuff Size: Medium)  - Pulse 60  - Ht 180.3 cm (5' 11)  - Wt (!) 101.6 kg (224 lb)  - SpO2 95%  - BMI 31.24 kg/m??      Wt Readings from Last 3 Encounters:   05/07/23 (!) 101.6 kg (224 lb)   05/04/23 (!) 101.6 kg (224 lb)   03/05/23 (!) 101.2 kg (223 lb 3.2 oz)       Physical Exam  General:  Pleasant male sitting in chair, in nad.   Neck: Supple. No JVD.   Resp:   CTAB bilaterally with normal WOB.   Cardio:  Bradycardic though regular, no appreciable murmur (previously II/VI in the RUSB)   Abdomen:   Nondistended   Extremities: Warm well-perfused bilaterally. Trace LE edema bilaterally.       Most Recent Labs   Lab Results   Component Value Date    NA 142 05/04/2023  K 4.5 05/04/2023    CL 103 05/04/2023    CO2 29.5 05/04/2023     Lab Results   Component Value Date    BUN 29 (H) 05/04/2023    BUN 35 (H) 10/08/2022    BUN 36 (H) 09/29/2022    BUN 26 01/02/2021     Lab Results   Component Value Date    Creatinine 3.26 (H) 05/04/2023    Creatinine 3.37 (H) 10/08/2022    Creatinine 3.51 (H) 09/29/2022    Creatinine 2.62 (H) 01/02/2021     Lab Results   Component Value Date    PRO-BNP 158.0 03/06/2016    PRO-BNP 50.8 01/03/2016     Lab Results   Component Value Date    Cholesterol 82 (A) 08/04/2022    Cholesterol, Total 140 01/03/2014    Triglycerides 161 08/04/2022    Triglycerides 80 01/03/2014    HDL 31 08/04/2022    HDL 53 01/03/2014    Non-HDL Cholesterol 65 (L) 01/29/2020    LDL Calculated 19 08/04/2022 LDL Cholesterol, Calculated 71 01/03/2014       -----------------------------------  Cleaster Corin, MD  [facility] Cardiovascular Disease Fellow

## 2023-05-08 NOTE — Unmapped (Signed)
Great to see you today!  As we mentioned today, please decrease your atenolol to 100 mg (1 tablet) every day because of your slow heart rate.    We will reach out to your eye doctors to discuss clearing you for surgery.    If you have any questions please reach out to [phone] (my personal number) -Dr. Venia Carbon.

## 2023-05-14 NOTE — Unmapped (Signed)
I saw and evaluated the patient, participating in the key portions of the service.  I reviewed the resident???s note.  I agree with the resident???s findings and plan. Almira Coaster, MD

## 2023-05-19 NOTE — Unmapped (Signed)
[facility]  [address]  [place], [place]  [address]  P [phone]  F [phone]     PCP:    Barbette Reichmann, MD   (hahn-DAY)    Care team:  Cleaster Corin, MD (Cardiology)     Sharlett Iles, MD (Nephrology)     Brown Human, MD (Ophtho @ [facility])    Last visit with me:  07/2022      Assessment/Plan:      HIV (dx'd 12/1999, nadir CD4 239 / 30% in 06/2011)  - chronic, stable    Current regimen:   dolutegravir and doravirine    ARV hx  Atripla (first regimen; rx'd 09/2007) - ~2010 - 07/2015  Descovy + Tivicay (d/t CKD) - 07/2015 - 12/2020  ABC 600/d + 3TC 100/d + DTG 50/d    - 12/2020 - 01/2021   (I didn't feel right - mostly GI symptoms)  Descovy + Tivicay - 01/2021 - 04/2022  Tivicay + Pifeltro - 04/2022 - present    Med access via Medicare.       Absolute CD4 Count   Date Value Ref Range Status   05/04/2023 782 510 - 2,320 /uL Final     CD4% (T Helper)   Date Value Ref Range Status   05/04/2023 34 34 - 58 % Final     HIV RNA Quant Result   Date Value Ref Range Status   05/04/2023 Detected (A) Not Detected Final     HIV RNA   Date Value Ref Range Status   05/04/2023 <20 (H) <0 copies/mL Final        Resistance hx  06/2002 - PR: 71T  01/2005 - PR: 41K, 63P, 71T -- RT: 196E, 214F  09/2007 - PR: 93P, 63S, 71T -- RT: 333E  07/2008 - PR: 63P, 63S, 71T -- RT: 333E  05/2023 - cumulative above with no significant resistance - [facility] HIVdb permanent link       03/2022 - Doing OK overall - tolerated the switch back to Descovy from split ABC + 3TC w/o difficulty.   07/2022 - He tolerated the switch to Pifeltro well - no issues. Takes these at nighttime, before bedtime. Looked at potential issues w/doravirine or dolutegravir with iHD, and they both seem to be OK - though dosing after HD would probably be advisable.  05/2023 - labs drawn at Dr Debria Garret appt on 03 Dec - CD4 count stable and RNA detected but <20, indicating ongoing stability on NRTI-sparing regimen of DOR and DTG. No issues w/refills or adherence.    CD4 count over 300 for >2Y on suppressive ART; no prophylaxis needed; recheck CD4 annually  Discussed ARV adherence  No labs today  Continue current therapy  Encouraged continued excellent ARV adherence      Neurocognitive concerns - chronic, progressive   05/2023 - Had extended discussion with pt and his wife about concerns she expressed in visit re: lack of motivation, forgetfulness, and perceived changes in his attention/engagement. He feels the onset was around time of worsening of his vision, and describes not being able to do things that he enjoyed doing previously, bc of difficulty with visual acuity (e.g., texting affirmations and messages to loved ones; reading; driving). This has also introduced some add'l stress in form of caretaking responsibilities on his wife. Discussed pros/cons of baseline neurocognitive testing given high prevalence of neurocognitive dysfunction among people who have lived w/HIV for a long time (HAND). Here, DDx I think is fairly broad;  I think depression or adjustment disorder would probably be highest, followed by med effects (diazepam, dicyclomine, pregabalin, ??-blockade), thyroid dz, possible early manifestations of ESRD, and HAND. Proposed a plan to get thru his eye procedures and see how things improve after this - then pursue add'l evals/workups after this, incrementally. Both he and his wife said this sounded reasonable to them.  Follow-up at next RTC      CKD stage G4/A2, pre-transplant  - chronic, progressive     eGFR CKD-EPI (2021) Male   Date Value Ref Range Status   05/04/2023 19 (L) >=60 mL/min/1.61m2 Final     Comment:     eGFR calculated with CKD-EPI 2021 equation in accordance with [employer] and [employer] of Nephrology Task Force recommendations.     Creatinine   Date Value Ref Range Status   05/04/2023 3.26 (H) 0.73 - 1.18 mg/dL Final     Cystatin C   Date Value Ref Range Status   03/12/2022 2.26 (H) 0.64 - 1.23 mg/L Final 03/2022 - Initial transplant eval 07/2020. Most recent visit w/[facility] 08/2021. Per HPI, doing well in interim. BP 133/79. Slow progression. No indication for HD. He is to complete a colonoscopy as the final phase of his transplant evaluation. Plan = RTC 32m (planned for 03/26/2022). With respect to his ARVs, the problem is that now his eGFR is consistently under 30 mL/min, and use of fixed-dose Descovy isn't recommended; the threshold for TAF by itself is 15 mL/min, (so it's OK) but for FTC it's 30 mL/min as the lower threshold before interval-spacing is required (for him, it'd be 200 mg every 72h). He's already having challenges keeping up w/his meds, so I'm concerned about making any changes at this time. I'm also a little concerned about possible influence of dolutegravir on creatinine secretion here and it maybe spuriously making his actual eGFR look worse - so checking a cystatin C seems reasonable to try to triangulate where he is currently.   07/2022 - Cystatin C at 03/2022 visit was in agreement w/SCr for eGFR. Sat Dr Stefano Gaul in f/u most recently on 26 Jan. Per note, xp eval is ongoing and pt favors HD over PD, when time comes; holding on referral for AVF creation.  05/2023 - had appt w/Dr [facility] in early December, no changes. Plan remained to pursue PD first before iHD. He says the plan is to still watch and wait - no plans for imminent start of PD.  Appreciate Dr Debria Garret ongoing mgm't      Liver disease  - HCV, genotype 1a, dx'd 1990s and cured s/p ELB/GRZ x12 weeks in 2017  -  RESOLVED  - Chronic transaminitis and declining platelets c/f NASH (08/2018 - present)  - chronic, stable     Lab Results   Component Value Date    AST 22 05/04/2023    AST 92 (A) 08/04/2022    AST 57 (H) 06/26/2022    AST 55 (H) 03/12/2022    AST 52 (H) 09/23/2021     Lab Results   Component Value Date    ALT 34 05/04/2023    ALT 134 (A) 08/04/2022    ALT 81 (H) 06/26/2022    ALT 81 (H) 03/12/2022    ALT 106 (H) 09/23/2021 Platelet   Date Value Ref Range Status   05/04/2023 117 (L) 150 - 450 10*9/L Final   08/06/2022 127 (L) 150 - 450 10*9/L Final   06/26/2022 103 (L) 150 - 450 10*9/L Final  03/12/2022 138 (L) 150 - 450 10*9/L Final   09/23/2021 128 (L) 150 - 450 10*9/L Final     03/2022 - Liver bx on 08Dec2006 showed grade 2 stage 2. EGD 09/2015 was w/o varices (normal plts @ time per Dr Harrel Lemon note from 04/06/2016). Because of his CKD, he was treated with 12 weeks of ELB/GRZ. HCV RNA at start of tx in 01/2016 was 328K, down to ND by 05/2016 and ND on rechecks in 07/2016, 09/2016, 11/2017, 12/2019, and 06/2021. Most recent GI f/u was in Jan 2023 [facility] NP) - per her note, Fibroscan in clinic was now F0-1 from [facility] pre-treatment. Overall assessment was for NASH. Plan = RTC 5-21m (summer 2023) - no f/u in future appts. Today, doing fine - no interval issues reported  07/2022 - No interim visits w/[facility]. LFTs checked this past Monday by PCP and manually entered in Epic so they appear alongside other values - see above - a bit higher than most recent checks but not markedly out  05/2023 - transaminases markedly improved between 07/2022 and 05/2023 assessments  Monitor over time      Cardiac disease  - HFpEF (EF 60-65% on TTE 09/2019)  - chronic, stable   - CAD s/p NSTEMI 10/2015 (LHC w/ multivessel dx - no stent, med mgm't) - chronic, stable   - Aortic sclerosis  - chronic, stable   - Difficult to control HTN  - chronic, stable   03/2022 - last visit w/Cards 11/2019 Lona Millard MD). Reported 63m worsening fatigue, was referred for home sleep study given c/f OSA. Carvedilol, ASA 81, atorva 20, torsemide 20, metolazone 10, spirono 25, amlo 7.5, atenolol 100, doxazosin 4, lisino 40, hydralazine 100 bid all cont'd. Plan was RTC in 79m (early 2022) - no f/u in future appts. Today, feeling well - no CP/DOE reported. He's walking frequently but no signif other exercise.  07/2022 - saw cards fellow Dr Lona Millard on 02Feb for f/u - continuing all current meds. 05/2023 - no chest pain or pressure, he initially denies any issues w/exercise tolerance from a cardiopulm perspective but then says he's having some DOE, mild  Appreciate Dr Ronny Flurry assistance  Continue current meds per Dr Venia Carbon & Cards      Pulmonary disease  - Abnormal chest CTs (4mm RLL nodule stable 03/2020 -> 07/2021, ?fibrotic changes 07/2021, 09/2021)  - chronic, stable  - 15 PY smoking hx, quit 1983  - chronic, stable    03/2022 - Saw Interventional Pulm in 09/2021. CTs reviewed, they felt the RLL nodule was of no signif concern given stability of 2Y timeline - but ordered HRCT given lower lung fibrotic vs atelectatic changes. HRCT completed 16May2023 with impression: Persistent but improved lower lung predominant subpleural reticular abnormality which may represent residual mild fibrosis, possibly from resolving organizing pneumonia. Representative cut (138/239) below. Plan for f/u not described in note.  Today, doing well - no interim issues.        07/2022 - no interim issues though he did describe briefly feeling sometimes a bit short of breath -  but nothing on a consistent basis and he's able to exercise on a regular basis (walking)  05/2023 - no cough or sputum production  Monitor over time  Hold on referrals      Orthopaedic issues  - s/p ORIF R acetabular fx 1990s (@ [facility]), c/b displaced screw in quadriceps (chronic)  - chronic, stable  - s/p lumbar spinal surgery 1990s, c/b chronic LBP  - chronic, stable  -  BLE numbness involving both feet, proximally to knees  - chronic, stable  03/2022 - Saw Ortho most recently 07/2020 (Obudzinski/Chen). Today, doing OK - just describes ongoing issues w/neuropathic discomfort in BLEs.  07/2022 - still describing neuropathy sxs and a general sense of heaviness in BLEs, w/o any s/sxs of claudication; he says the pregabalin is maybe helping more than the gaba had  05/2023 - still w/signif interim discomfort related to neuropathy and BLE discomfort  Monitor  OK to continue pregabalin but concerned about potential for this and some of his other meds to be contributing to neurocognitive concerns (see above)      Endocrine & metabolic issues  - T2DM without use of insulin (A1c 6.5% in 08/2021)  - chronic, stable   - Obesity (BMI 30.5)  - chronic, progressive   - Gout  - chronic, stable   - Abnormal TFTs (low TSH 10/2019 @ [facility], 03/2021 @ [facility])  - chronic, stable   - Abnormal lipids, on statin (HDL 33, LDL 36 in 03/2021 @ [facility])  - chronic, stable   - Normal 25(OH)D level 10/2019  - chronic, stable     Wt Readings from Last 4 Encounters:   05/07/23 (!) 101.6 kg (224 lb)   05/04/23 (!) 101.6 kg (224 lb)   03/05/23 (!) 101.2 kg (223 lb 3.2 oz)   09/29/22 99.2 kg (218 lb 12.8 oz)   ...  09/30/21 99.3 kg (219 lb)   11/12/20 98.2 kg (216 lb 6.4 oz)   10/10/19 (!) 104.3 kg (230 lb)   07/14/18 (!) 102.9 kg (226 lb 14.4 oz)   11/02/17 (!) 102.3 kg (225 lb 9.6 oz)      Free T4   Date Value Ref Range Status   03/12/2022 0.86 (L) 0.89 - 1.76 ng/dL Final     T3, Free   Date Value Ref Range Status   03/12/2022 3.07 2.30 - 4.20 pg/mL Final     TSH   Date Value Ref Range Status   03/12/2022 0.508 (L) 0.550 - 4.780 uIU/mL Final     Lab Results   Component Value Date    VITDTOTAL 59.4 08/06/2022        03/2022 - Re: T2DM and elevated A1c, he continues on current DM meds w/o any reported missed doses or issues. Re: weight/BMI, stable from 09/2021. Re: gout, no interval episodes. Re: thyroid symptoms, no palpitations, subjective warmth/cold, energy levels, etc.   07/2022 - TFTs from 03/2022 visit had FT4 and TSH slightly below range, FT3 WNL. No specific sxs today  05/2023 - Progression of neurocognitive issues over time is somewhat concerning but no other clear manifestations of thyroid or metabolic dz today - also recently had blood work done so holding on add'l assessments  Repeat TFTs in 2025 (either thru me or Dr Marcello Fennel)      Sexual health & secondary prevention  - chronic, stable  Married (wife) . NEED TO INQUIRE @ FUTURE RTC      Lab Results   Component Value Date    RPR Nonreactive 05/04/2023    RPR Nonreactive 03/12/2022    CTNAA Negative 06/08/2018    GCNAA Negative 06/08/2018    SPECSOURCE Urine (Male) 06/08/2018       GC/CT NAATs - not being checked routinely for this patient  RPR - repeat 12 months after prior      Health maintenance    Diabetes                       (  2024 ADA SoC)  A1c at goal of <7% as of 07/2022  defer mgm't to PCP   Lab Results   Component Value Date    GLU 111 05/04/2023    A1C 6.8 08/04/2022    A1C 6.5 (H) 09/23/2021    A1C 5.8 (H) 01/29/2020          Liver health                   (APRI & FIB-4)  suspicion for MAFLD high  monitor over time   Lab Results   Component Value Date    ALT 34 05/04/2023    ALT 134 (A) 08/04/2022    ALT 81 (H) 06/26/2022    PLT 117 (L) 05/04/2023    PLT 127 (L) 08/06/2022    PLT 103 (L) 06/26/2022          ASCVD risk reduction   (REPRIEVE)  baseline risk score >=20%  Has been on atorva since 2017 (40 mg since 07/2018)    AAA screening  assessment needed but deferred to future visit   Lab Results   Component Value Date    LDL 19 08/04/2022    TRIG 161 08/04/2022       The 10-yr ASCVD Risk score Denman George DC Jr., et al., 2013) failed to calculate due to the following reason:  The 2013 10-yr ASCVD risk score is only valid if the patient does not have prior/existing clinical ASCVD (myocardial infarction, stroke, CABG, coronary angioplasty, angina or peripheral arterial disease, coronary atherosclerosis, ischemic heart disease, or cerebrovascular disease). The patient has prior/existing ASCVD.  Had Heart Attack  Has Coronary Atherosclerosis           Kidney health  defer mgm't to Nephrology   Lab Results   Component Value Date    CREATININE 3.26 (H) 05/04/2023    PROTEINUA Negative 05/04/2023    PROTEINUR 6.9 03/05/2020    GLUCOSEU Negative 05/04/2023    ALBCRERAT 8.1 09/29/2022    PCRATIOUR 0.075 03/05/2020          Bone health (FRAX)  Patient's height in cm: 180.3  FRAX score before DEXA: 14% (date: 20 May 2023)  Needs DEXA   Lab Results   Component Value Date    VITDTOTAL 59.4 08/06/2022          Communicable diseases  TB screening  no longer needed; negative IGRA, low risk    Viral hepatitis screening  does not meet screening criteria; no further screening indicated   Quantiferon TB Gold Plus Interpretation   Date Value Ref Range Status   01/29/2020 Negative Negative Final     QFT TB GLD   Date Value Ref Range Status   07/01/2011 NEGATIVE  Final     Hep A IgG   Date Value Ref Range Status   01/29/2020 Reactive (A) Nonreactive Final     Hep B Surface Ag   Date Value Ref Range Status   01/31/2020 Nonreactive Nonreactive Final     Hep B S Ab   Date Value Ref Range Status   01/29/2020 Nonreactive Nonreactive, Grayzone Final     Comment:     Nonreactive and Grayzone results are considered non-immune.     Hep B Core Total Ab   Date Value Ref Range Status   01/31/2020 Reactive (A) Nonreactive Final     Hepatitis C Ab   Date Value Ref Range Status   01/29/2020 Reactive (A) Nonreactive Final     HCV  RNA   Date Value Ref Range Status   06/06/2021 Not Detected Not Detected Final     HCV RNA (IU)   Date Value Ref Range Status   02/05/2016 328,448 (H) <12 IU/mL Final   08/01/2014 161096 [IU]/mL Final          Vaccine-preventable illnesses  MMR & VZV screening  no further assessment needed    Vaccines needed           (CDC Adult Schedule)  MenACWY 1?? series (2 doses - 0 and 14m)  PCV20 (1 dose >5Y after series)    Hepatitis B core total Ab positive and sAb negative  Unclear if HBV immunization needed (guidance unclear)           Rubella IgG Scr   Date Value Ref Range Status   01/29/2020 Positive  Final     Comment:     A result of Positive or Negative is determined according to the CLSI I/LA6-A recommended cutoff of 10 IU/mL established with the calibrators.     Varicella IgG   Date Value Ref Range Status   01/29/2020 Positive  Final       Most Recent Immunizations   Administered Date(s) Administered    COVID-19 VAC,BIVALENT,MODERNA(BLUE CAP) 01/23/2022    COVID-19 VACCINE,MRNA(MODERNA)(PF) 07/27/2020    Covid-19 Vac, (53yr+) (Spikevax) Monovalent Moderna 03/19/2023    HEPATITIS B VACCINE ADULT, ADJUVANTED, IM(HEPLISAV B) 01/31/2020    HEPATITIS B VACCINE ADULT,IM(ENERGIX B, RECOMBIVAX) 08/24/2016    INFLUENZA ADJUVANTED PF, IIV3(44YR UP)(FLUAD) 03/19/2023    INFLUENZA INJ MDCK PF, QUAD,(FLUCELVAX)(63MO AND UP EGG FREE) 02/28/2019    INFLUENZA QUAD ADJUVANTED 44YR UP(FLUAD) 01/23/2022    INFLUENZA TIV (TRI) PF (IM)(HISTORICAL) 03/25/2011    Influenza Vaccine Quad(IM)6 MO-Adult(PF) 02/26/2021    Influenza Virus Vaccine, unspecified formulation 03/31/2017    PNEUMOCOCCAL POLYSACCHARIDE 23-VALENT 12/01/2017    PPD Test 03/25/2011    Pneumococcal Conjugate 13-Valent 04/13/2012    RSV VACCINE,ADJUVANTED(PF)(49YRS+)(AREXVY) 08/12/2022    SHINGRIX-ZOSTER VACCINE (HZV),RECOMBINANT,ADJUVANTED(IM) 12/01/2017    TdaP 10/27/2018    Tuberculin Skin Test;unspecified Formulation 03/25/2011          Cancer screening  Anorectal   assessment needed but deferred to future visit    Colorectal  S/p FIT+DNA 10/2020 (neg) but per Dr Stefano Gaul needs c-scope prior to possible transplantation    Liver  S/p [place] 06/2020 - neg    Lung  screening not indicated    Prostate  screening not indicated   Lab Results   Component Value Date    PSASCRN 0.4 07/09/2010    PSA 0.35 01/29/2020              Oral health  has no  dentist  last exam - long time (edentulous - dentures up/low)  His bottom plate is too loose; broke upper plate (as of 04/54/0981)   Eye health  does  use corrective lenses  last exam - 01/2023  Pending procedures for POAG and cataracts (thru [facility])       Information above was last reviewed & updated: 20 May 2023         I personally spent 65 minutes face-to-face and non-face-to-face in the care of this patient, which includes all pre, intra, and post visit time on the date of service. (+[id number])      Disposition  Next appointment: 5-6 months      To do @ next RTC  Neurocog better after eye procedure(s)?  Neurocog testing?  Subjective        HPI  In addition to details in A&P above:    Has issues currently w/glaucoma and cataracts. He says he was supposed to get a procedure for this but bc of some miscommunication he couldn't get it done. He says the scheduling for this is going to happen soon. He says both of his eyes are affected.    Dr Venia Carbon changed his water pill from 1.5 to just 1, about 2 weeks ago. He says he hasn't noticed any difference after this dose reduction. He has an around-the-arm cuff at home. He says this AM it was a little high - 159/89. He says he thinks the goal is supposed to be 130s-140s/70s-80s - and he says he gets this pretty regular. He checks his sugar and BP daily.    He confirms he did start the semaglutide (as Ozempic) - he tolerated it well, no significant issue.          Past Medical History:   Diagnosis Date    Abnormal EKG 11/09/2015    Acute encephalopathy 11/30/2016    Anemia in stage 3 chronic kidney disease (CMS-HCC) 01/19/2015    Last Assessment & Plan: Formatting of this note might be different from the original. # Severe iron deficiency anemia/hemoglobin- 6/ ferritin 5 [march 2017]. Status post IV iron- significant improvement of hemoglobin. Last IV Feraheme was 08/28/2015 where he received 510 mg. #Was seen by Dr. Donneta Romberg in February 2019 with complaints of worsening significant pica/fatigue/cold intolerance.  Started    Bradycardia 11/09/2015    Chronic hepatitis C without hepatic coma (CMS-HCC) 01/19/2015    CKD (chronic kidney disease) stage 3, GFR 30-59 ml/min (CMS-HCC) 01/08/2015    Coronary artery disease 10/2015    Multivessel disease, plan medical management after cath 6/17    GERD (gastroesophageal reflux disease)     Gout     HCV (hepatitis C virus) 11/22/2012    (genotype 1, HCV RNA =311,000; liver biopsy 05/2005 =chronic hepatitis grade II stage II).  Has consistently and repeatedly refused therapy.       HIV (human immunodeficiency virus infection) (CMS-HCC)     Undectable viral load 5/17    Hypertension     Nephrolithiasis     Nephrotic syndrome 03/05/2014    Neurological deficit present 11/13/2015    Weakness in lower extremities    Personal history of gout 10/27/2018    Renal mass     Followed by neprhology, MRI 8/16 improved, felt to be benign       Social History  Background - Originally from [facility] [place]). Lived in [place] when he was younger. Was a coach for long-distance and high jump, triple jump.     Housing - in [place]  with wife - no pets at home  - has a son and daughter, grandkids and great-grandkids - all of them in [place] ([place], [place]) -- confirmed 03/2022  School / Work & Benefits - not in school, retired (previously worked doing ISS to help troubled youth), and on Medicare -- confirmed 03/2022    Tobacco -  15PY history of cigarettes, quit 1983  -- confirmed 03/2022  Alcohol - never drinks alcohol -- confirmed 03/2022  Substance use - previous IDU (VERY distant)  -- confirmed 03/2022      Medications and Allergies  He has a current medication list which includes the following prescription(s): carvedilol, accu-chek guide glucose meter, accu-chek guide l1-l2 ctrl sol, albuterol, allopurinol, amlodipine, aspirin, atenolol,  atorvastatin, onetouch verio test strips, calcium carbonate-vitamin d2, cholecalciferol (vitamin d3 25 mcg (1,000 units)), clonidine hcl, diazepam, dicyclomine, tivicay, pifeltro, dorzolamide-timolol, ferrous sulfate, folic acid, glimepiride, hydralazine, hydrocodone-acetaminophen, lisinopril, nitroglycerin, nystatin, ofloxacin, omega-3 fatty acids-fish oil, ondansetron, prednisolone acetate, pregabalin, rocklatan, ozempic, sertraline, timolol, and torsemide.    Allergies: Colchicine analogues and Tramadol      Family History  His family history includes Cancer in his father and mother; Diabetes in his brother; Heart disease (age of onset: 72) in his sister; Hypertension in his mother and sister; Kidney disease in his father; Kidney failure (age of onset: 44) in his sister; Stroke in his brother; Thyroid disease in his mother.               Objective      BP 150/93 (BP Site: L Arm, BP Position: Sitting, BP Cuff Size: Medium)  - Pulse 65  - Temp 36.9 ??C (98.4 ??F) (Skin)  - Resp 15  - Ht 180.3 cm (5' 11)  - Wt (!) 102.2 kg (225 lb 6.4 oz)  - BMI 31.44 kg/m??      Const WDWN, NAD, non-toxic appearance    Eyes lids normal bilaterally, conjunctiva anicteric and noninjected OU   PERRL    ENMT normal appearance of external nose and ears, no nasal discharge   OP clear - edentulous - dentures in upper and lower   Neck neck of normal appearance and trachea midline   no thyromegaly, nodules, or tenderness    Lymph no LAD in neck    CV RRR, no r/g, S1/S2 - difficult auscultation (distant)  no peripheral edema, WWP    Resp normal WOB   on RA, no breathlessness with speaking, no coughing, CTAB    GI normal inspection, NTND, NABS - protuberant  no umbilical hernia on exam    GU deferred   MSK no clubbing or cyanosis of hands   no focal tenderness or abnormalities of joints of RUE, LUE, RLE, or LLE    Skin no rashes, lesions, or ulcers of visualized skin   no nodules or areas of induration of palpated skin    Neuro CNs II-XII grossly intact   sensation to light touch grossly intact throughout    Psych appropriate affect   oriented to person, place, time

## 2023-05-20 ENCOUNTER — Ambulatory Visit: Admit: 2023-05-20 | Discharge: 2023-05-21 | Payer: MEDICARE

## 2023-05-20 DIAGNOSIS — Z79899 Other long term (current) drug therapy: Principal | ICD-10-CM

## 2023-05-20 DIAGNOSIS — R918 Other nonspecific abnormal finding of lung field: Principal | ICD-10-CM

## 2023-05-20 DIAGNOSIS — R29818 Other symptoms and signs involving the nervous system: Principal | ICD-10-CM

## 2023-05-20 DIAGNOSIS — G8929 Other chronic pain: Principal | ICD-10-CM

## 2023-05-20 DIAGNOSIS — M1000 Idiopathic gout, unspecified site: Principal | ICD-10-CM

## 2023-05-20 DIAGNOSIS — I5032 Chronic diastolic (congestive) heart failure: Principal | ICD-10-CM

## 2023-05-20 DIAGNOSIS — I214 Non-ST elevation (NSTEMI) myocardial infarction: Principal | ICD-10-CM

## 2023-05-20 DIAGNOSIS — E1140 Type 2 diabetes mellitus with diabetic neuropathy, unspecified: Principal | ICD-10-CM

## 2023-05-20 DIAGNOSIS — I151 Hypertension secondary to other renal disorders: Principal | ICD-10-CM

## 2023-05-20 DIAGNOSIS — Z23 Encounter for immunization: Principal | ICD-10-CM

## 2023-05-20 DIAGNOSIS — I358 Other nonrheumatic aortic valve disorders: Principal | ICD-10-CM

## 2023-05-20 DIAGNOSIS — Z9189 Other specified personal risk factors, not elsewhere classified: Principal | ICD-10-CM

## 2023-05-20 DIAGNOSIS — B20 Human immunodeficiency virus [HIV] disease: Principal | ICD-10-CM

## 2023-05-20 DIAGNOSIS — K760 Fatty (change of) liver, not elsewhere classified: Principal | ICD-10-CM

## 2023-05-20 DIAGNOSIS — M545 Chronic midline low back pain without sciatica: Principal | ICD-10-CM

## 2023-05-20 DIAGNOSIS — N184 Chronic kidney disease, stage 4 (severe): Principal | ICD-10-CM

## 2023-05-20 DIAGNOSIS — R4189 Other symptoms and signs involving cognitive functions and awareness: Principal | ICD-10-CM

## 2023-05-20 MED ORDER — NALOXONE 4 MG/ACTUATION NASAL SPRAY
99 refills | 0.00 days
Start: 2023-05-20 — End: ?

## 2023-05-20 MED FILL — NALOXONE 4 MG/ACTUATION NASAL SPRAY: 1 days supply | Qty: 1 | Fill #0

## 2023-05-31 ENCOUNTER — Encounter: Payer: Self-pay | Admitting: Internal Medicine

## 2023-05-31 NOTE — Unmapped (Signed)
The patient's wife called the Triage line seeking advise about the referral and scheduling of her husband's surgery after he has completed his pre-op screening at [facility]. Her concerns have been forwarded to the Provider

## 2023-06-02 NOTE — Unmapped (Signed)
[facility] Specialty and Home Delivery Pharmacy Refill Coordination Note    Specialty Medication(s) to be Shipped:   Infectious Disease: Pifeltro and Tivicay    Other medication(s) to be shipped: No additional medications requested for fill at this time     Samuel [name], DOB: 03/04/1951  Phone: [phone] (home)       All above HIPAA information was verified with patient.     Was a Nurse, learning disability used for this call? No    Completed refill call assessment today to schedule patient's medication shipment from the [facility] and Home Delivery Pharmacy  [phone]).  All relevant notes have been reviewed.     Specialty medication(s) and dose(s) confirmed: Regimen is correct and unchanged.   Changes to medications: Samuel [name] reports no changes at this time.  Changes to insurance: No  New side effects reported not previously addressed with a pharmacist or physician: None reported  Questions for the pharmacist: No    Confirmed patient received a Conservation officer, historic buildings and a Surveyor, mining with first shipment. The patient will receive a drug information handout for each medication shipped and additional FDA Medication Guides as required.       DISEASE/MEDICATION-SPECIFIC INFORMATION        N/A    SPECIALTY MEDICATION ADHERENCE     Medication Adherence    Patient reported X missed doses in the last month: 0  Specialty Medication: TIVICAY 50 mg TABLET (dolutegravir)  Patient is on additional specialty medications: Yes  Additional Specialty Medications: PIFELTRO 100 mg Tab (doravirine)  Patient Reported Additional Medication X Missed Doses in the Last Month: 0  Patient is on more than two specialty medications: No  Any gaps in refill history greater than 2 weeks in the last 3 months: no  Demonstrates understanding of importance of adherence: yes  Informant: patient  Confirmed plan for next specialty medication refill: delivery by pharmacy  Refills needed for supportive medications: not needed          Refill Coordination Has the Patients' Contact Information Changed: No  Is the Shipping Address Different: No         Were doses missed due to medication being on hold? No    PIFELTRO 100 mg: 4 days of medicine on hand   TIVICAY 50  mg: 4 days of medicine on hand       REFERRAL TO PHARMACIST     Referral to the pharmacist: Not needed      [facility]     Shipping address confirmed in Epic.       Delivery Scheduled: Yes, Expected medication delivery date: 06/04/23.     Medication will be delivered via Next Day Courier to the prescription address in Epic WAM.    Samuel [name]   [facility] Special[facility] and Home Delivery Pharm[facility]  Specialty Technician

## 2023-06-03 DIAGNOSIS — B20 Human immunodeficiency virus [HIV] disease: Principal | ICD-10-CM

## 2023-06-03 DIAGNOSIS — Z79899 Other long term (current) drug therapy: Principal | ICD-10-CM

## 2023-06-03 DIAGNOSIS — N184 Chronic kidney disease, stage 4 (severe): Principal | ICD-10-CM

## 2023-06-03 MED FILL — PIFELTRO 100 MG TABLET: ORAL | 30 days supply | Qty: 30 | Fill #2

## 2023-06-03 MED FILL — TIVICAY 50 MG TABLET: ORAL | 30 days supply | Qty: 30 | Fill #3

## 2023-06-07 NOTE — Unmapped (Signed)
Amber from [facility] called and is requesting the cardiac clearance.  She requests it be faxed to [phone].

## 2023-06-08 NOTE — Unmapped (Signed)
Update regarding clearance

## 2023-06-08 NOTE — Unmapped (Signed)
Family requesting cardiac clearance

## 2023-07-02 NOTE — Unmapped (Signed)
[facility] Specialty and Home Delivery Pharmacy Refill Coordination Note    Specialty Medication(s) to be Shipped:   Infectious Disease: Pifeltro and Tivicay    Other medication(s) to be shipped: No additional medications requested for fill at this time     Samuel [name], DOB: 01/01/1951  Phone: [phone] (home)       All above HIPAA information was verified with patient.     Was a Nurse, learning disability used for this call? No    Completed refill call assessment today to schedule patient's medication shipment from the [facility] and Home Delivery Pharmacy  [phone]).  All relevant notes have been reviewed.     Specialty medication(s) and dose(s) confirmed: Regimen is correct and unchanged.   Changes to medications: Samuel [name] reports no changes at this time.  Changes to insurance: No  New side effects reported not previously addressed with a pharmacist or physician: None reported  Questions for the pharmacist: No    Confirmed patient received a Conservation officer, historic buildings and a Surveyor, mining with first shipment. The patient will receive a drug information handout for each medication shipped and additional FDA Medication Guides as required.       DISEASE/MEDICATION-SPECIFIC INFORMATION        N/A    SPECIALTY MEDICATION ADHERENCE     Medication Adherence    Patient reported X missed doses in the last month: 0  Specialty Medication: Tivicay 50mg  QD  Patient is on additional specialty medications: Yes  Additional Specialty Medications: Pifeltro 100mg  QD  Patient Reported Additional Medication X Missed Doses in the Last Month: 0  Patient is on more than two specialty medications: No  Informant: patient              Were doses missed due to medication being on hold? No    Tivicay  50 mg: 4 days of medicine on hand   Pifeltro 100 mg: 4 days of medicine on hand       REFERRAL TO PHARMACIST     Referral to the pharmacist: Not needed      [facility]     Shipping address confirmed in Epic.       Delivery Scheduled: Yes, Expected medication delivery date: 07/05/23.     Medication will be delivered via Same Day Courier to the prescription address in Epic WAM.    Samuel [name], PharmD   [facility] and [facility] Delivery Pharm[facility]  Specialty Pharmacist

## 2023-07-05 MED FILL — TIVICAY 50 MG TABLET: ORAL | 30 days supply | Qty: 30 | Fill #4

## 2023-07-05 MED FILL — PIFELTRO 100 MG TABLET: ORAL | 30 days supply | Qty: 30 | Fill #3

## 2023-07-22 NOTE — Unmapped (Signed)
 [facility] Specialty and Home Delivery Pharmacy Refill Coordination Note    Specialty Medication(s) to be Shipped:   Infectious Disease: Pifeltro and Tivicay    Other medication(s) to be shipped: No additional medications requested for fill at this time     Samuel [name], DOB: 10-06-1950  Phone: [phone] (home)       All above HIPAA information was verified with patient.     Was a Nurse, learning disability used for this call? No    Completed refill call assessment today to schedule patient's medication shipment from the [facility] and Home Delivery Pharmacy  [phone]).  All relevant notes have been reviewed.     Specialty medication(s) and dose(s) confirmed: Regimen is correct and unchanged.   Changes to medications: Samuel [name] reports no changes at this time.  Changes to insurance: No  New side effects reported not previously addressed with a pharmacist or physician: None reported  Questions for the pharmacist: No    Confirmed patient received a Conservation officer, historic buildings and a Surveyor, mining with first shipment. The patient will receive a drug information handout for each medication shipped and additional FDA Medication Guides as required.       DISEASE/MEDICATION-SPECIFIC INFORMATION        N/A    SPECIALTY MEDICATION ADHERENCE     Medication Adherence    Patient reported X missed doses in the last month: 0  Specialty Medication: TIVICAY 50 mg TABLET (dolutegravir)  Patient is on additional specialty medications: Yes  Additional Specialty Medications: PIFELTRO 100 mg Tab (doravirine)  Patient Reported Additional Medication X Missed Doses in the Last Month: 0  Patient is on more than two specialty medications: No  Any gaps in refill history greater than 2 weeks in the last 3 months: no  Demonstrates understanding of importance of adherence: yes  Informant: patient  Confirmed plan for next specialty medication refill: delivery by pharmacy  Refills needed for supportive medications: not needed          Refill Coordination Has the Patients' Contact Information Changed: No  Is the Shipping Address Different: No         Were doses missed due to medication being on hold? No    PIFELTRO 100 mg: 10 days of medicine on hand   TIVICAY 50 mg: 10 days of medicine on hand       REFERRAL TO PHARMACIST     Referral to the pharmacist: Not needed      [facility]     Shipping address confirmed in Epic.       Delivery Scheduled: Yes, Expected medication delivery date: 07/29/23.     Medication will be delivered via Next Day Courier to the prescription address in Epic WAM.    Kerby Less   [facility] Specialty and Home Delivery Pharmacy  Specialty Technician

## 2023-07-28 MED FILL — TIVICAY 50 MG TABLET: ORAL | 30 days supply | Qty: 30 | Fill #5

## 2023-07-28 MED FILL — PIFELTRO 100 MG TABLET: ORAL | 30 days supply | Qty: 30 | Fill #4

## 2023-08-18 NOTE — Unmapped (Signed)
 [facility] Specialty and Home Delivery Pharmacy Refill Coordination Note    Specialty Medication(s) to be Shipped:   Infectious Disease: Pifeltro and Tivicay    Other medication(s) to be shipped: No additional medications requested for fill at this time     Samuel [name], DOB: 15-Mar-1951  Phone: [phone] (home)       All above HIPAA information was verified with patient.     Was a Nurse, learning disability used for this call? No    Completed refill call assessment today to schedule patient's medication shipment from the [facility] and Home Delivery Pharmacy  [phone]).  All relevant notes have been reviewed.     Specialty medication(s) and dose(s) confirmed: Regimen is correct and unchanged.   Changes to medications: Jarrah reports no changes at this time.  Changes to insurance: No  New side effects reported not previously addressed with a pharmacist or physician: None reported  Questions for the pharmacist: No    Confirmed patient received a Conservation officer, historic buildings and a Surveyor, mining with first shipment. The patient will receive a drug information handout for each medication shipped and additional FDA Medication Guides as required.       DISEASE/MEDICATION-SPECIFIC INFORMATION        N/A    SPECIALTY MEDICATION ADHERENCE     Medication Adherence    Patient reported X missed doses in the last month: 0  Specialty Medication: dolutegravir: TIVICAY 50 mg TABLET  Patient is on additional specialty medications: Yes  Additional Specialty Medications: doravirine: PIFELTRO 100 mg Tab  Patient is on more than two specialty medications: No              Were doses missed due to medication being on hold? No    dolutegravir: TIVICAY 50 mg TABLET: 6 days of medicine on hand   doravirine: PIFELTRO 100 mg Tab: 6 days of medicine on hand       REFERRAL TO PHARMACIST     Referral to the pharmacist: Not needed      [facility]     Shipping address confirmed in Epic.     Cost and Payment: Patient has a $0 copay, payment information is not required.    Delivery Scheduled: Yes, Expected medication delivery date: 08/20/2023.     Medication will be delivered via Same Day Courier to the prescription address in Epic WAM.    Samuel [name]   [facility] Special[facility] and Home Delivery Pharm[facility]  Specialty Technician

## 2023-08-20 MED FILL — PIFELTRO 100 MG TABLET: ORAL | 30 days supply | Qty: 30 | Fill #5

## 2023-08-20 MED FILL — TIVICAY 50 MG TABLET: ORAL | 30 days supply | Qty: 30 | Fill #6

## 2023-08-30 NOTE — Unmapped (Unsigned)
 HISTORY OF PRESENT ILLNESS:      Samuel [name] is a 73 year old man with stage G4:A2 chronic kidney disease who is evaluated today for stage G4:A2 chronic kidney disease.  Unfortunately, he has developed severe visual loss left greater than right due to glaucoma.  He has been seeing Dr. Venice Gillis at the [facility] he was recommending cataract extraction with intraocular lens implant with trabeculectomy with postoperative steroid therapy 09/27/2023.   He recently saw Dr. Napper at the [facility] at [facility] in consultation who added lisinopril 40 mg daily.  Will have a follow-up serum potassium level assessed today.  He does not have dysgeusia, increased leg edema, chest pain, PND, or orthopnea.  He cooperates that he has had some difficulty with memory and is quite dependent on his spouse.  He does not want to pursue a formal neurologic evaluation.  He has not had a recent fall.    MEDICATIONS:    Current Outpatient Medications   Medication Sig Dispense Refill    ACCU-CHEK GUIDE GLUCOSE METER Misc FOLLOW PACKAGE DIRECTIONS      ACCU-CHEK GUIDE L1-L2 CTRL SOL Soln       albuterol (PROVENTIL HFA;VENTOLIN HFA) 90 mcg/actuation inhaler Inhale 2 puffs. Take as needed      allopurinoL (ZYLOPRIM) 100 MG tablet Take 2.5 tablets (250 mg total) by mouth daily.      amlodipine (NORVASC) 10 MG tablet Take 1 tablet (10 mg total) by mouth daily. 90 tablet 3    aspirin (ECOTRIN) 81 MG tablet daily.   0    atenolol (TENORMIN) 100 MG tablet Take 1 tablet (100 mg total) by mouth daily. 100 tablet 3    atorvastatin (LIPITOR) 40 MG tablet Take 1 tablet (40 mg total) by mouth daily.      blood sugar diagnostic (ONETOUCH VERIO TEST STRIPS) Strp Use 2 (two) times daily E11.22      calcium carbonate-vitamin D2 500 mg(1,250mg ) -200 unit tablet Take 1 tablet (500 mg total) by mouth two (2) times a day.      cholecalciferol, vitamin D3 25 mcg, 1,000 units,, 1,000 unit (25 mcg) tablet Take by mouth daily.      cloNIDine HCL (CATAPRES) 0.1 MG tablet Take 1 tablet (0.1 mg total) by mouth nightly.      diazepam (VALIUM) 5 MG tablet Take 1 tablet (5 mg total) by mouth nightly.      dicyclomine (BENTYL) 20 mg tablet       dolutegravir (TIVICAY) 50 mg TABLET Take 1 tablet (50 mg total) by mouth daily. 90 tablet 3    doravirine (PIFELTRO) 100 mg Tab Take 1 tablet (100 mg total) by mouth daily. 30 tablet 11    dorzolamide-timoloL (COSOPT) 22.3-6.8 mg/mL ophthalmic solution Administer 1 drop to both eyes two (2) times a day.      ferrous sulfate 325 (65 FE) MG tablet Take 1 tablet (325 mg total) by mouth in the morning.      folic acid (FOLVITE) 1 MG tablet   0    glimepiride (AMARYL) 4 MG tablet Take 1 tablet (4 mg total) by mouth in the morning. 90 tablet 3    hydrALAZINE (APRESOLINE) 100 MG tablet Take 1 tablet (100 mg total) by mouth two (2) times a day.      HYDROcodone-acetaminophen (NORCO) 5-325 mg per tablet Directions are take one tablet 30 minutes before physical therapy, then take one tablet at bedtime as needed for pain      lisinopril (  PRINIVIL,ZESTRIL) 40 MG tablet Take 1 tablet (40 mg total) by mouth daily. 90 tablet 3    naloxone (NARCAN) 4 mg nasal spray Call 911. Administer a single spray of narcan in on nostril. Repeat every 3 minutes as needed if no or minimal response. (Patient not taking: Reported on 09/03/2023) 1 each 99    nitroglycerin (NITROSTAT) 0.4 MG SL tablet Place 1 tablet (0.4 mg total) under the tongue every five (5) minutes as needed for chest pain. Maximum of 3 doses in 15 minutes. 25 tablet 0    nystatin (MYCOSTATIN) 100,000 unit/mL suspension Take 5 mL (500,000 Units total) by mouth daily.      ofloxacin (OCUFLOX) 0.3 % ophthalmic solution Administer 1 drop into the left eye four (4) times a day.      omega-3 fatty acids-fish oil 340-1,000 mg capsule Take 1 capsule by mouth two (2) times a day.      ondansetron (ZOFRAN) 4 MG tablet       prednisoLONE acetate (PRED FORTE) 1 % ophthalmic suspension Administer 1 drop into the left eye four (4) times a day.      ROCKLATAN 0.02-0.005 % Drop INT 1 GTT INTO OU QHS      semaglutide (OZEMPIC) 1 mg/dose (2 mg/1.5 mL) PnIj Inject 1 mg under the skin every seven (7) days.      sertraline (ZOLOFT) 50 MG tablet take 1 tablet by mouth once daily      timolol (TIMOPTIC) 0.5 % ophthalmic solution INSTILL 1 DROP IN BOTH EYES EVERY MORNING      torsemide (DEMADEX) 10 MG tablet Take 2 tablets (20 mg total) by mouth daily.       No current facility-administered medications for this visit.           REVIEW OF SYSTEMS:  Constitutional: No constitutional symptoms.  No dysgeusia.  Cardiovascular: No chest pain.  Pulmonary: No cough or hemoptysis.  All other systems are reviewed and are negative except that noted in the history of present illness.    PHYSICAL EXAMINATION:      Constitutional: He is in no acute distress.  Vital signs:BP 160/92 (BP Site: L Arm, BP Position: Sitting, BP Cuff Size: Large)  - Pulse 63  - Wt 98.9 kg (218 lb)  - BMI 30.42 kg/m??   PTHomeBP   Wt Readings from Last 3 Encounters:   05/20/23 (!) 102.2 kg (225 lb 6.4 oz)   05/07/23 (!) 101.6 kg (224 lb)   05/04/23 (!) 101.6 kg (224 lb)   HEENT: No oropharyngeal lesions.  Neck: Supple without lymphadenopathy or thyromegaly. No carotid bruits.   Lungs: Clear to auscultation   Heart: Regular rate and rhythm. Normal S1-S2.   Abdomen: Protuberant.  Soft, nontender, normoactive bowel sounds.  Liver span is about 9 cm to percussion.  Neurologic: Alert and oriented x 3.  Strength is normal.  DTRs are 2+ and symmetric.  There are no lateralizing sensory deficits.  No asterixis.  Extremities: No pretibial edema.  Dorsalis pedis and posterior tibial pulses are palpable.    Node survey: No lymphadenopathy.   Musculoskeletal: No active synovitis.   Skin/Nails:  No rash or nail changes.      LABORATORY STUDIES:    Recent Results (from the past week)   Renal Function Panel    Collection Time: 09/07/23 11:24 AM   Result Value Ref Range    Sodium 142 135 - 145 mmol/L    Potassium 4.5 3.4 - 4.8 mmol/L  Chloride 103 98 - 107 mmol/L    CO2 28.7 20.0 - 31.0 mmol/L    Anion Gap 10 5 - 14 mmol/L    BUN 23 9 - 23 mg/dL    Creatinine 1.61 (H) 0.73 - 1.18 mg/dL    BUN/Creatinine Ratio 8     eGFR CKD-EPI (2021) Male 24 (L) >=60 mL/min/1.17m2    Glucose 87 70 - 99 mg/dL    Calcium 09.6 8.7 - 04.5 mg/dL    Phosphorus 3.7 2.4 - 5.1 mg/dL    Albumin 4.2 3.4 - 5.0 g/dL       4/0/9811    Sodium 143 potassium 4.6 chloride 105 bicarbonate 26 BUN 24 creatinine 3.1 EGFR 21 glucose 136 AST 40 ALT 68 alk phos 72 albumin 4.5 total protein 7.5 calcium 10    White count 8.4 hemoglobin 13.9 hematocrit 41.7 lately count 118    Urine albumin to creatinine ratio 38.7    Hemoglobin A1c 5.9%    LDL cholesterol 39 total cholesterol 104 triglycerides 180 HDL cholesterol 30      Serum creatinine trend:    Date Value   08/06/2023 3.10 (H)   04/07/2023 3.50 (H)   03/24/2022 3.00 (H)   03/12/2022 2.62 (H)   09/23/2021 3.26 (H)   06/06/2021 2.81 (H)   02/26/2021 2.18 (H)   01/29/2021 2.63 (H)   01/02/2021 2.62 (H)   11/08/2020 2.70 (H)     Imaging:    Chest CT 06/24/2021    Chest CT shows stability of the right pulmonary nodule.  However, it showed increased bilateral lower lobe groundglass and reticular abnormality may represent changes of infectious or organizing pneumonia (including autoimmune or medication-related etiologies).      IMPRESSION AND PLAN:    1.  Stage G4/A2 chronic kidney disease.  Given the recent introduction of ACE and admission, will obtain follow-up renal function panel today to exclude significant hyperkalemia.  He is not a candidate for deceased donor kidney transplant due to his comorbidities.  His risk of progression to end-stage kidney disease at 2 years is about 7% and about 21% at 5 years.    2. Cognitive impairment.  This is likely multifactorial.  Dr. Kevan Peers is working on minimizing polypharmacy to the extent possible.    3.  History of hyperkalemia.  Reassess the serum potassium level today given his propensity toward hyperkalemia and the recent introduction of lisinopril 40 mg daily.      4.  Hypertension.  His office measured rating is not in goal.  He will monitor home readings.  He will be seeing Dr. Napper in the near future as well as Dr. Guyann Leitz for follow-up.    5.  Human immunodeficiency virus disease.  He will see Dr. Lowry Rued in June.    6.  Secondary/tertiary hyperparathyroidism.  He has not had signs or symptoms of symptomatic hypercalcemia.  His most recent serum calcium level was acceptable at 10 mg/dL.    7.  Type 2 diabetes mellitus.  He is doing well on semaglutide 1 mg weekly.  There are rare reports of this drug inducing nonarteritic anterior ischemic optic neuropathy.  I asked him to discuss this with Dr. Venice Gillis.  Overall, I believe the benefits of this medication with regard to his renal and cardiovascular outcomes outweigh the risk; nonetheless, given his low vision, it would be good to review this with Dr. Venice Gillis.    8.  Metabolic dysfunction associated with fatty liver disease.  Continue semaglutide.  9.  Chronic low back pain.  Follow-up with pain management.    10. Peripheral neuropathy.  Follow-up with Dr. Kevan Peers.    11. Second degree Mobitz type I (Wenckebach) atrioventricular block.  He does not have symptoms.    12. Follow up plans.  Will follow-up on the serum potassium level from today.  I will see him back again in about 4 months.      Samuel [name]. Laveda Ports, MD  Date of service 09/07/2023 likely has structural cardiac abnormalities secondary to longstanding hypertension.  The heart block is likely compounded by the combination of beta-blockade and clonidine.  My recommendation is to discontinue clonidine. The dose of atenolol could also be potentially tapered.     12. Follow up plans.      He is going to talk with Dr. Kevan Peers about possibly seeing a neurologist.  I called their office and he has been referred twice since 2020 and the patient declined the consultation. Ongoing work to taper off centrally acting agents is indicated.  I like to see him back again in about 3 to 4 months to reassess his kidney function.    Recommendations:    1.  Return in 3 to 4 months to reassess kidney function.  Will continue ongoing discussions regarding peritoneal dialysis.  2.  Discontinue clonidine.  3.  Work with Dr. Kevan Peers and his pain management team to taper or minimize centrally acting agents.  4.  Consider referral to neurology for evaluation of cognitive impairment    Samuel [name] A. Laveda Ports, MD  Date of service 09/07/2023

## 2023-09-03 ENCOUNTER — Ambulatory Visit
Admit: 2023-09-03 | Discharge: 2023-09-04 | Attending: Student in an Organized Health Care Education/Training Program | Primary: Student in an Organized Health Care Education/Training Program

## 2023-09-03 MED ORDER — LISINOPRIL 40 MG TABLET
ORAL_TABLET | Freq: Every day | ORAL | 3 refills | 90 days | Status: CP
Start: 2023-09-03 — End: 2024-09-02

## 2023-09-03 NOTE — Unmapped (Signed)
 DIVISION OF CARDIOLOGY  [facility] [place]        Date of Service: 09/03/2023    Follow-up Patient Clinic Note    PCP: Referring Provider:   Antonio Baumgarten, MD  [address] [place] [address]  Phone: [phone]  Fax: [phone] Antonio Baumgarten, MD  [address]  [place],  [place] [address]  Phone: [phone]  Fax: [phone]       Assessment and Plan:   CAD  Hx of Type II NSTEMI (11/12/15)  Coronary angiography 11/13/15 showed diffuse disease including 90% large OM without any intervention, medical therapy pursed at that time. This is suggestive of a type II event. PET stress in 2021 was normal. Continues to deny ischemic symptoms.   - Atenonlol 100mg  daily   - Continue aspirin 81mg , and Atorvastatin 40mg   - PRN SL NTG   - LDL 39 (08/06/2023)    HFpEF  Euvolemic. Last TTE (04/09/2023) Normal EF, grade 1 diastolic dysfunction  - Continue torsemide 40mg  daily and Spironolactone 25mg  daily  - GFR precludes SGLT-2    Resistant HTN  Hypertensive in office today-typically managed by nephrology.  Upcoming visit therefore will not make adjustments today.  - Continue amlodipine 10 mg daily, lisinopril 40 mg daily, hydralazine 100 mg BID, atenolol 100 mg daily, doxazosin 4 mg daily, torsemide 40 mg daily, and spironolactone 25 mg daily   -Patient will return to see me in approximately 4 weeks to clarify his medication regimen-some concern that given his visual disturbances that he may have difficulty managing his own medications    Aortic Sclerosis:  Moderately thickened leaflets with normal excursion on 09/2019 echo.    Bradycardia, type II AV block (Mobitz 1)  This is currently considered to be a benign arrhythmia, and likely an age-related phenomena.  Mobitz 1 currently does not require additional intervention as he is currently asymptomatic.  Will continue to monitor this over time.  We will also decrease his nodal blockade which may be contributing to slower heart rates.  - Atenolol 100 mg daily    CKD, Stage G4:A2  Currently follows with Nephrology. They have had ongoing conversations regarding the need for HD vs transplantation - no current plan for these therapies. Serial monitoring previously been recommended (April 2024).     Mr. Hochmuth will return in 6 month or sooner as needed.     The patient was seen with and evaluated by Dr. Lenon Radar.      Subjective:     Chief Complaint:  Fatigue, lack of energy, dyspnea    History of Present Illness:   Samuel [name] is a 73 y.o. male with a history of CAD with recent NSTEMI 10/2015, HIV (diagnosed 12/1999), CKD, Chronic HCV, gout, nephrotic syndrome, and HTN who is presents for evaluation of worsening fatigue, decreased energy and dyspnea.      Of note, patient was admitted on 11/12/2015 to the [facility] with typical substernal chest pain described as pressure brought on by exertion, with shortness breath and diaphoresis with radiation to left arm. Labs were notable for elevated troponin, peak at 0.585 downtrending 0.433, and EKG showing T-wave inversions in the lateral leads. Echocardiography showed normal LVEF, diastolic dysfunction and moderate to severe LVH. Patient received LHC on 6/14 that showed coronary artery disease including: 40% mid LAD at the D1 bifurcation area, 30-40% mid/distal LAD, 95% stenosis in the large branching OM, 80-90% stenoses in both distal branches of  the large OM, diffuse mild disease in the RCA. Decision was made to medically manage patient is discharged on DAPT with aspirin and clopidogrel in addition to Imdur 30 daily for chest pain. Patient was last seen almost 2 years ago and has canceled / rescheduled multiple follow up appointments since that time.      Interval history:  He reports that he is still recovering his vision from his eye procedures. Blood pressure at home is typically high 120-mid 130/80s - reportedly in the green signifying normotension. Denies chest pain, no change in his breathing in the past month with exertion (known to be functionally limited after a remote back surgery). Does have some PND - though no orthopnea, lower extremity swelling. No significant changes in weight.       Cardiovascular History & Procedures:  Cardiovascular Problems:  CAD with NSTEMI 11/12/2015  HTN    Risk Factors:  advanced age (older than 61 for men, 3 for women), hypertension and male gender    Cath / PCI:  Coronary Angiography 11/13/2015:   1. Coronary artery disease including:  a) 40% mid LAD at the D1 bifurcation area  b) 30-40% mid/distal LAD  c) 95% stenosis in the large branching OM  d) 80-90% stenoses in both distal branches of the large OM  e) Diffuse mild disease in the RCA    2. Elevated left heart filling pressure (LVEDP = 20 mmHg)   3. No intervention was made.    CV Surgery:   None    EP Procedures and Devices:  None    Non-Invasive Evaluation(s):  TTE 10/10/2019  1. The left ventricle is normal in size with mildly increased wall thickness.    2. The left ventricular systolic function is normal, LVEF is visually estimated at 60-65%.    3. There is grade I diastolic dysfunction (impaired relaxation).    4. The right ventricle is normal in size, with normal systolic function.    5. The mitral valve leaflets are mildly thickened with normal leaflet mobility.    6. The aortic valve is trileaflet with moderately thickened leaflets with normal excursion.    7. IVC size and inspiratory change suggest mildly elevated right atrial pressure. (5-10 mmHg).    TTE 11/13/2015  Left ventricular hypertrophy - moderate  Normal left ventricular systolic function, ejection fraction > 55%  Dilated left atrium - mild  Aortic sclerosis  Normal right ventricular systolic function      Medical History:  Past Medical History:   Diagnosis Date    Abnormal EKG 11/09/2015    Acute encephalopathy 11/30/2016    Anemia in stage 3 chronic kidney disease 01/19/2015    Last Assessment & Plan: Formatting of this note might be different from the original. # Severe iron deficiency anemia/hemoglobin- 6/ ferritin 5 [march 2017]. Status post IV iron- significant improvement of hemoglobin. Last IV Feraheme was 08/28/2015 where he received 510 mg. #Was seen by Dr. Valentine Gasmen in February 2019 with complaints of worsening significant pica/fatigue/cold intolerance.  Started    Bradycardia 11/09/2015    Chronic hepatitis C without hepatic coma 01/19/2015    CKD (chronic kidney disease) stage 3, GFR 30-59 ml/min (CMS-HCC) 01/08/2015    Coronary artery disease 10/2015    Multivessel disease, plan medical management after cath 6/17    GERD (gastroesophageal reflux disease)     Gout     HCV (hepatitis C virus) 11/22/2012    (genotype 1, HCV RNA =311,000; liver biopsy 05/2005 =chronic hepatitis grade  II stage II).  Has consistently and repeatedly refused therapy.       HIV (human immunodeficiency virus infection)     Undectable viral load 5/17    Hypertension     Nephrolithiasis     Nephrotic syndrome 03/05/2014    Neurological deficit present 11/13/2015    Weakness in lower extremities    Personal history of gout 10/27/2018    Renal mass     Followed by neprhology, MRI 8/16 improved, felt to be benign       Surgical History:  Past Surgical History:   Procedure Laterality Date    BACK SURGERY      2012    PR CATH PLACE/CORON ANGIO, IMG SUPER/INTERP,W LEFT HEART VENTRICULOGRAPHY N/A 11/13/2015    Procedure: Left Heart Catheterization;  Surgeon: Melecio Sports, MD;  Location: [facility] CATH;  Service: Cardiology    PR REMOVAL OF ANAL FISSURE N/A 06/29/2014    Procedure: FISSURECTOMY, INCLUDING SPHINCTEROTOMY, WHEN PERFORMED;  Surgeon: Elfrieda Grise, MD;  Location: [facility] OR [facility];  Service: Gastrointestinal    PR SURG DIAGNOSTIC EXAM, ANORECTAL N/A 06/29/2014    Procedure: ANORECTAL EXAM, SURGICAL, REQUIRING ANESTHESIA (GENERAL, SPINAL, OR EPIDURAL), DIAGNOSTIC;  Surgeon: Elfrieda Grise, MD;  Location: [facility] OR [facility];  Service: Gastrointestinal       Social History:   reports that he quit smoking about 31 years ago. His smoking use included cigarettes. He started smoking about 41 years ago. He has a 10 pack-year smoking history. He has never been exposed to tobacco smoke. He has never used smokeless tobacco. He reports that he does not drink alcohol and does not use drugs.      Family History:  family history includes Cancer in his father and mother; Diabetes in his brother; Heart disease (age of onset: 61) in his sister; Hypertension in his mother and sister; Kidney disease in his father; Kidney failure (age of onset: 47) in his sister; Stroke in his brother; Thyroid disease in his mother.      Review of Systems:   Except as noted in the HPI, the remainder of 10 systems reviewed is negative.     Allergies:  Allergies   Allergen Reactions    Colchicine Analogues Diarrhea     Have diarrhea when taken for long periods of time    Tramadol Other (See Comments)     unknown tolerates morphine       Medications:   Prior to Admission medications    Medication Dose, Route, Frequency   ACCU-CHEK GUIDE GLUCOSE METER Misc FOLLOW PACKAGE DIRECTIONS   ACCU-CHEK GUIDE L1-L2 CTRL SOL Soln    albuterol (PROVENTIL HFA;VENTOLIN HFA) 90 mcg/actuation inhaler 2 puffs   allopurinoL (ZYLOPRIM) 100 MG tablet 250 mg, Daily (standard)   amlodipine (NORVASC) 10 MG tablet 10 mg, Oral, Daily (standard)   aspirin (ECOTRIN) 81 MG tablet Daily (standard)   atenolol (TENORMIN) 100 MG tablet 100 mg, Oral, Daily (standard)   atorvastatin (LIPITOR) 40 MG tablet 40 mg, Daily (standard)   blood sugar diagnostic (ONETOUCH VERIO TEST STRIPS) Strp Use 2 (two) times daily E11.22   calcium carbonate-vitamin D2 500 mg(1,250mg ) -200 unit tablet 1 tablet, 2 times a day (standard)   cholecalciferol, vitamin D3 25 mcg, 1,000 units,, 1,000 unit (25 mcg) tablet Daily (standard)   cloNIDine HCL (CATAPRES) 0.1 MG tablet 0.1 mg, Oral, Nightly   diazepam (VALIUM) 5 MG tablet 5 mg, Nightly   dicyclomine (BENTYL) 20 mg tablet    dolutegravir (TIVICAY) 50 mg TABLET 50  mg, Oral, Daily (standard)   doravirine (PIFELTRO) 100 mg Tab 1 tablet, Oral, Daily (standard)   dorzolamide-timoloL (COSOPT) 22.3-6.8 mg/mL ophthalmic solution 1 drop, 2 times a day (standard)   ferrous sulfate 325 (65 FE) MG tablet 325 mg, Daily   folic acid (FOLVITE) 1 MG tablet No dose, route, or frequency recorded.   glimepiride (AMARYL) 4 MG tablet 4 mg, Oral, Daily   hydrALAZINE (APRESOLINE) 100 MG tablet 100 mg, Oral, 2 times a day (standard)   HYDROcodone-acetaminophen (NORCO) 5-325 mg per tablet Directions are take one tablet 30 minutes before physical therapy, then take one tablet at bedtime as needed for pain   lisinopril (PRINIVIL,ZESTRIL) 40 MG tablet 40 mg, Oral, Daily (standard)   naloxone (NARCAN) 4 mg nasal spray Call 911. Administer a single spray of narcan in on nostril. Repeat every 3 minutes as needed if no or minimal response.   nitroglycerin (NITROSTAT) 0.4 MG SL tablet 0.4 mg, Sublingual, Every 5 min PRN, Maximum of 3 doses in 15 minutes.   nystatin (MYCOSTATIN) 100,000 unit/mL suspension 5 mL, Daily (standard)   ofloxacin (OCUFLOX) 0.3 % ophthalmic solution 1 drop, 4 times a day   omega-3 fatty acids-fish oil 340-1,000 mg capsule 1 capsule, 2 times a day (standard)   ondansetron (ZOFRAN) 4 MG tablet    prednisoLONE acetate (PRED FORTE) 1 % ophthalmic suspension 1 drop, Left Eye, 4 times a day   ROCKLATAN 0.02-0.005 % Drop INT 1 GTT INTO OU QHS   semaglutide (OZEMPIC) 1 mg/dose (2 mg/1.5 mL) PnIj 1 mg, Subcutaneous, Every 7 days   sertraline (ZOLOFT) 50 MG tablet take 1 tablet by mouth once daily   timolol (TIMOPTIC) 0.5 % ophthalmic solution INSTILL 1 DROP IN BOTH EYES EVERY MORNING   torsemide (DEMADEX) 10 MG tablet 20 mg, Oral, Daily (standard)         Objective:     Vitals  There were no vitals taken for this visit.     Wt Readings from Last 3 Encounters:   05/20/23 (!) 102.2 kg (225 lb 6.4 oz)   05/07/23 (!) 101.6 kg (224 lb)   05/04/23 (!) 101.6 kg (224 lb)       Physical Exam  General:  Pleasant male sitting in chair, in nad.   Neck: Supple. No JVD.   Resp:   CTAB bilaterally with normal WOB.   Cardio:  Bradycardic though regular, no appreciable murmur (previously II/VI in the RUSB)   Abdomen:   Nondistended   Extremities: Warm well-perfused bilaterally. Trace LE edema bilaterally.       Most Recent Labs   Lab Results   Component Value Date    NA 142 05/04/2023    K 4.5 05/04/2023    CL 103 05/04/2023    CO2 29.5 05/04/2023     Lab Results   Component Value Date    BUN 29 (H) 05/04/2023    BUN 35 (H) 10/08/2022    BUN 36 (H) 09/29/2022    BUN 26 01/02/2021     Lab Results   Component Value Date    Creatinine 3.26 (H) 05/04/2023    Creatinine 3.37 (H) 10/08/2022    Creatinine 3.51 (H) 09/29/2022    Creatinine 2.62 (H) 01/02/2021     Lab Results   Component Value Date    PRO-BNP 158.0 03/06/2016    PRO-BNP 50.8 01/03/2016     Lab Results   Component Value Date    Cholesterol 82 (A) 08/04/2022    Cholesterol,  Total 140 01/03/2014    Triglycerides 161 08/04/2022    Triglycerides 80 01/03/2014    HDL 31 08/04/2022    HDL 53 01/03/2014    Non-HDL Cholesterol 65 (L) 01/29/2020    LDL Calculated 19 08/04/2022    LDL Cholesterol, Calculated 71 01/03/2014       -----------------------------------  Ramona Burner, MD  [facility] Cardiovascular Disease Fellow

## 2023-09-07 ENCOUNTER — Ambulatory Visit: Admit: 2023-09-07 | Discharge: 2023-09-08 | Attending: Nephrology | Primary: Nephrology

## 2023-09-07 DIAGNOSIS — E1140 Type 2 diabetes mellitus with diabetic neuropathy, unspecified: Principal | ICD-10-CM

## 2023-09-07 DIAGNOSIS — K760 Fatty (change of) liver, not elsewhere classified: Principal | ICD-10-CM

## 2023-09-07 DIAGNOSIS — R918 Other nonspecific abnormal finding of lung field: Principal | ICD-10-CM

## 2023-09-07 DIAGNOSIS — B20 Human immunodeficiency virus [HIV] disease: Principal | ICD-10-CM

## 2023-09-07 DIAGNOSIS — M1000 Idiopathic gout, unspecified site: Principal | ICD-10-CM

## 2023-09-07 DIAGNOSIS — I5032 Chronic diastolic (congestive) heart failure: Principal | ICD-10-CM

## 2023-09-07 DIAGNOSIS — F3340 Major depressive disorder, recurrent, in remission, unspecified: Principal | ICD-10-CM

## 2023-09-07 DIAGNOSIS — E66811 Obesity (BMI 30.0-34.9): Principal | ICD-10-CM

## 2023-09-07 DIAGNOSIS — N184 Chronic kidney disease, stage 4 (severe): Principal | ICD-10-CM

## 2023-09-07 DIAGNOSIS — I151 Hypertension secondary to other renal disorders: Principal | ICD-10-CM

## 2023-09-07 DIAGNOSIS — I358 Other nonrheumatic aortic valve disorders: Principal | ICD-10-CM

## 2023-09-07 DIAGNOSIS — I214 Non-ST elevation (NSTEMI) myocardial infarction: Principal | ICD-10-CM

## 2023-09-07 LAB — RENAL FUNCTION PANEL
ALBUMIN: 4.2 g/dL (ref 3.4–5.0)
ANION GAP: 10 mmol/L (ref 5–14)
BLOOD UREA NITROGEN: 23 mg/dL (ref 9–23)
BUN / CREAT RATIO: 8
CALCIUM: 10.1 mg/dL (ref 8.7–10.4)
CHLORIDE: 103 mmol/L (ref 98–107)
CO2: 28.7 mmol/L (ref 20.0–31.0)
CREATININE: 2.77 mg/dL — ABNORMAL HIGH (ref 0.73–1.18)
EGFR CKD-EPI (2021) MALE: 24 mL/min/1.73m2 — ABNORMAL LOW (ref >=60)
GLUCOSE RANDOM: 87 mg/dL (ref 70–99)
PHOSPHORUS: 3.7 mg/dL (ref 2.4–5.1)
POTASSIUM: 4.5 mmol/L (ref 3.4–4.8)
SODIUM: 142 mmol/L (ref 135–145)

## 2023-09-07 MED ORDER — ATENOLOL 100 MG TABLET
ORAL_TABLET | Freq: Every day | ORAL | 3 refills | 100.00 days | Status: CP
Start: 2023-09-07 — End: 2024-09-06

## 2023-09-07 NOTE — Unmapped (Addendum)
 Labs today to be sure your potassium level is stable  Bring in all of your medications when you see Dr. Patt Boozer  Return visit in 4 months

## 2023-09-07 NOTE — Unmapped (Signed)
 [facility] Refill Coordination Note    Specialty Medication(s) to be Shipped:   Infectious Disease: Pifeltro and Tivicay    Other medication(s) to be shipped: No additional medications requested for fill at this time     Samuel [name], DOB: 02-04-1951  Phone: [phone] (home)       All above HIPAA information was verified with patient.     Was a Nurse, learning disability used for this call? No    Completed refill call assessment today to schedule patient's medication shipment from the [facility]  [phone]).  All relevant notes have been reviewed.     Specialty medication(s) and dose(s) confirmed: Regimen is correct and unchanged.   Changes to medications: Tsutomu reports no changes at this time.  Changes to insurance: No  New side effects reported not previously addressed with a pharmacist or physician: None reported  Questions for the pharmacist: No    Confirmed patient received a Conservation officer, historic buildings and a Surveyor, mining with first shipment. The patient will receive a drug information handout for each medication shipped and additional FDA Medication Guides as required.       DISEASE/MEDICATION-SPECIFIC INFORMATION        N/A    SPECIALTY MEDICATION ADHERENCE     Medication Adherence    Patient reported X missed doses in the last month: 0  Specialty Medication: dolutegravir: TIVICAY 50 mg TABLET  Patient is on additional specialty medications: Yes  Additional Specialty Medications: doravirine: PIFELTRO 100 mg Tab  Patient Reported Additional Medication X Missed Doses in the Last Month: 0  Patient is on more than two specialty medications: No              Were doses missed due to medication being on hold? No    dolutegravir: TIVICAY 50 mg TABLET: 10 days of medicine on hand    doravirine: PIFELTRO 100 mg Tab: 10 days of medicine on hand       REFERRAL TO PHARMACIST     Referral to the pharmacist: Not needed      [facility]     Shipping address confirmed in Epic.     Cost and Payment: Patient has a $0 copay, payment information is not required.    Delivery Scheduled: Yes, Expected medication delivery date: 09/14/2023.     Medication will be delivered via Next Day Courier to the prescription address in Epic WAM.    Lanny Plan   [facility]  Specialty Technician

## 2023-09-08 NOTE — Unmapped (Signed)
 Addended by: Bobbye Burrow on: 09/08/2023 10:07 AM     Modules accepted: Level of Service

## 2023-09-13 MED FILL — PIFELTRO 100 MG TABLET: ORAL | 30 days supply | Qty: 30 | Fill #6

## 2023-09-13 MED FILL — TIVICAY 50 MG TABLET: ORAL | 30 days supply | Qty: 30 | Fill #7

## 2023-10-06 NOTE — Unmapped (Signed)
 [facility] Refill Coordination Note    Specialty Medication(s) to be Shipped:   Infectious Disease: Pifeltro and Tivicay    Other medication(s) to be shipped: No additional medications requested for fill at this time     Samuel [name], DOB: 1950-12-16  Phone: [phone] (home)       All above HIPAA information was verified with patient.     Was a Nurse, learning disability used for this call? No    Completed refill call assessment today to schedule patient's medication shipment from the [facility]  [phone]).  All relevant notes have been reviewed.     Specialty medication(s) and dose(s) confirmed: Regimen is correct and unchanged.   Changes to medications: Ricci reports no changes at this time.  Changes to insurance: No  New side effects reported not previously addressed with a pharmacist or physician: None reported  Questions for the pharmacist: Yes: pharmacist completed refill call - patient was concerned about missing doses since he was down to last pill. Based on fill history, he should have a lot more left but we will set up delivery for tomorrow so he does not miss doses.    Confirmed patient received a Conservation officer, historic buildings and a Surveyor, mining with first shipment. The patient will receive a drug information handout for each medication shipped and additional FDA Medication Guides as required.       DISEASE/MEDICATION-SPECIFIC INFORMATION        N/A    SPECIALTY MEDICATION ADHERENCE     Medication Adherence    Patient reported X missed doses in the last month: 0  Specialty Medication: PIFELTRO 100 mg Tab (doravirine)  Patient is on additional specialty medications: Yes  Additional Specialty Medications: TIVICAY 50 mg TABLET (dolutegravir)  Patient Reported Additional Medication X Missed Doses in the Last Month: 0  Patient is on more than two specialty medications: No  Informant: patient              Were doses missed due to medication being on hold? No    Tivicay 50 mg: 1 doses of medicine on hand   Pifeltro 100 mg: 1 doses of medicine on hand       REFERRAL TO PHARMACIST     Referral to the pharmacist: Not needed      SHIPPING     Shipping address confirmed in Epic.     Cost and Payment: Patient has a $0 copay, payment information is not required.    Delivery Scheduled: Yes, Expected medication delivery date: 10/07/23.     Medication will be delivered via Same Day Courier to the prescription address in Epic WAM.    Riesa Chary, PharmD   [facility]  Specialty Pharmacist

## 2023-10-07 MED FILL — TIVICAY 50 MG TABLET: ORAL | 30 days supply | Qty: 30 | Fill #8

## 2023-10-07 MED FILL — PIFELTRO 100 MG TABLET: ORAL | 30 days supply | Qty: 30 | Fill #7

## 2023-10-13 ENCOUNTER — Other Ambulatory Visit: Payer: Self-pay

## 2023-10-13 ENCOUNTER — Emergency Department

## 2023-10-13 ENCOUNTER — Encounter: Payer: Self-pay | Admitting: Emergency Medicine

## 2023-10-13 ENCOUNTER — Emergency Department
Admission: EM | Admit: 2023-10-13 | Discharge: 2023-10-13 | Disposition: A | Discharge status: Home / Self Care | Attending: Emergency Medicine | Admitting: Emergency Medicine

## 2023-10-13 DIAGNOSIS — R202 Paresthesia of skin: Secondary | ICD-10-CM | POA: Diagnosis not present

## 2023-10-13 DIAGNOSIS — F17200 Nicotine dependence, unspecified, uncomplicated: Secondary | ICD-10-CM | POA: Insufficient documentation

## 2023-10-13 DIAGNOSIS — I10 Essential (primary) hypertension: Secondary | ICD-10-CM | POA: Insufficient documentation

## 2023-10-13 DIAGNOSIS — R29810 Facial weakness: Secondary | ICD-10-CM | POA: Insufficient documentation

## 2023-10-13 DIAGNOSIS — E119 Type 2 diabetes mellitus without complications: Secondary | ICD-10-CM | POA: Diagnosis not present

## 2023-10-13 DIAGNOSIS — R791 Abnormal coagulation profile: Secondary | ICD-10-CM | POA: Diagnosis not present

## 2023-10-13 LAB — COMPREHENSIVE METABOLIC PANEL WITH GFR
ALT: 83 U/L — ABNORMAL HIGH (ref 0–44)
AST: 54 U/L — ABNORMAL HIGH (ref 15–41)
Albumin: 4.1 g/dL (ref 3.5–5.0)
Alkaline Phosphatase: 55 U/L (ref 38–126)
Anion gap: 10 (ref 5–15)
BUN: 22 mg/dL (ref 8–23)
CO2: 25 mmol/L (ref 22–32)
Calcium: 9.5 mg/dL (ref 8.9–10.3)
Chloride: 107 mmol/L (ref 98–111)
Creatinine, Ser: 2.61 mg/dL — ABNORMAL HIGH (ref 0.61–1.24)
GFR, Estimated: 25 mL/min — ABNORMAL LOW (ref >60)
Glucose, Bld: 68 mg/dL — ABNORMAL LOW (ref 70–99)
Potassium: 3.9 mmol/L (ref 3.5–5.1)
Sodium: 142 mmol/L (ref 135–145)
Total Bilirubin: 1.3 mg/dL — ABNORMAL HIGH (ref 0.0–1.2)
Total Protein: 7.5 g/dL (ref 6.5–8.1)

## 2023-10-13 LAB — DIFFERENTIAL
Abs Immature Granulocytes: 0.02 10*3/uL (ref 0.00–0.07)
Basophils Absolute: 0 10*3/uL (ref 0.0–0.1)
Basophils Relative: 0 %
Eosinophils Absolute: 0.1 10*3/uL (ref 0.0–0.5)
Eosinophils Relative: 1 %
Immature Granulocytes: 0 %
Lymphocytes Relative: 24 %
Lymphs Abs: 1.9 10*3/uL (ref 0.7–4.0)
Monocytes Absolute: 0.7 10*3/uL (ref 0.1–1.0)
Monocytes Relative: 9 %
Neutro Abs: 5.2 10*3/uL (ref 1.7–7.7)
Neutrophils Relative %: 66 %

## 2023-10-13 LAB — CBG MONITORING, ED
Glucose-Capillary: 57 mg/dL — ABNORMAL LOW (ref 70–99)
Glucose-Capillary: 64 mg/dL — ABNORMAL LOW (ref 70–99)
Glucose-Capillary: 96 mg/dL (ref 70–99)

## 2023-10-13 LAB — CBC
HCT: 41.3 % (ref 39.0–52.0)
Hemoglobin: 13.8 g/dL (ref 13.0–17.0)
MCH: 30.7 pg (ref 26.0–34.0)
MCHC: 33.4 g/dL (ref 30.0–36.0)
MCV: 91.8 fL (ref 80.0–100.0)
Platelets: 121 10*3/uL — ABNORMAL LOW (ref 150–400)
RBC: 4.5 MIL/uL (ref 4.22–5.81)
RDW: 13.6 % (ref 11.5–15.5)
WBC: 7.9 10*3/uL (ref 4.0–10.5)
nRBC: 0 % (ref 0.0–0.2)

## 2023-10-13 LAB — PROTIME-INR
INR: 1.2 (ref 0.8–1.2)
Prothrombin Time: 15.3 s — ABNORMAL HIGH (ref 11.4–15.2)

## 2023-10-13 LAB — ETHANOL: Alcohol, Ethyl (B): <15 mg/dL (ref <15)

## 2023-10-13 LAB — APTT: aPTT: 29 s (ref 24–36)

## 2023-10-13 MED ORDER — SODIUM CHLORIDE 0.9% FLUSH: 3.0000 mL | Freq: Once | INTRAVENOUS | Status: DC

## 2023-10-13 NOTE — ED Notes (Signed)
Pt eating apple sauce.

## 2023-10-13 NOTE — ED Notes (Signed)
 Pt still endorsing numbness and tingling in L extremities. Pt concerned with elevated BP. Per MD Paduchowski, RN to wait on correcting BP until scans are back. Pt made aware.

## 2023-10-13 NOTE — ED Notes (Signed)
 Pt reporting to ED d/t facial droop with concerns of stroke. Pt had R sided facial droop, vision changes, and numbness in the L hand and foot. Per pt, symptoms began 1 week ago. Pt resting comfortably in bed at this time, awaiting reading from MRI scan. Pt family at the bedside. Pt denies any needs at this time. Pt ABCs intact. RR even and unlabored. Pt in NAD. Bed in lowest locked position. Call bell in reach.  Past Medical History:  Diagnosis Date   Anemia    Aortic stenosis, mild    mild AS by echo, 08/2011   Cervical disc herniation    CHF (congestive heart failure) (HCC)    Chronic kidney disease    kidney stones. chronic kidney disease, stage III. followed by doctor in [facility] Marlee Trentman   Cirrhosis [facility])    due to hepatitis c not being treated for a long periiod of time   Coronary artery disease    Depression    Diabetes mellitus without complication (HCC) 06/2017   A1C 10.0   Full dentures    upper and lower   GERD (gastroesophageal reflux disease)    Gout    Heart murmur    no treatment   Hep C w/o coma, chronic (HCC) 01/19/2015   took treatment and it is now gone.    Hepatitis C 09/25/11   HIV positive (HCC) 09/25/2011   nondetectable.    Hypertension    MI (myocardial infarction) (HCC) 2017   NSTEMI   Neuromuscular disorder (HCC)    numbness in fingers most likely due to shoulder damage.

## 2023-10-13 NOTE — ED Notes (Signed)
 Pt discharged to ED circle at this time and left with all belongings. Pt ABCs intact. RR even and unlabored. Pt in NAD. Pt denies further needs from this RN.

## 2023-10-13 NOTE — ED Notes (Signed)
 Gave orange juice. Edp notified.

## 2023-10-13 NOTE — ED Notes (Signed)
 Pt resting comfortably. Water provided to wife. Waiting for MRI.

## 2023-10-13 NOTE — Discharge Instructions (Signed)
 As we discussed please call the number provided for neurology to arrange a follow-up appointment.  Return to the emergency department for any weakness or numbness of any arm or leg confusion or slurred speech or any other symptom personally concerning to yourself.  Otherwise please follow-up with your primary care doctor in the next 1 to 2 days for recheck/reevaluation.

## 2023-10-13 NOTE — ED Notes (Signed)
 CBG 64. Discussed with EDP.

## 2023-10-13 NOTE — ED Triage Notes (Signed)
 Pt via POV from home. Pt c/o R sided facial droop, vision changes, numbness in the L hand, and L foot. Reports that he has been seeing his eye doctor for glaucoma. States symptoms started approx a week ago. Pt is poor historian and unable to give specific LKW but pt states approx a week. Pt is A&Ox4 and NAD, ambulatory to triage.

## 2023-10-13 NOTE — ED Notes (Signed)
 MRI to come for pt around 1815.

## 2023-10-13 NOTE — ED Notes (Signed)
 Pt stood to void. Was not dizzy.

## 2023-10-13 NOTE — ED Notes (Signed)
 Upon further questioning, pt states blurry vision has been since months.

## 2023-10-13 NOTE — ED Notes (Signed)
 No sandwich trays or peanut butter available. Provided chocolate ensure to pt. EDP aware.

## 2023-10-13 NOTE — ED Notes (Signed)
 EDP at bedside. Pt has had recent glaucoma surgery to R eye with post op complication of bleeding. States always has blurry vision to both eyes but it has been worse last 1 week. Pt has R facial droop. Wife at bedside. Pt wearing sunglasses. Pt alert, oriented.

## 2023-10-13 NOTE — ED Provider Notes (Signed)
-----------------------------------------   9:22 PM on 10/13/2023 ----------------------------------------- Patient care assumed from Dr. Eddy Goodell.  Patient's MRI has resulted showing no new infarct.  Patient does have scattered foci within the cerebral hemisphere concerning for amyloid versus hypertensive encephalopathy with chronic microvascular changes.  However given no acute process patient is safe for discharge home.  We will have the patient follow-up with neurology as an outpatient.  Patient and wife agreeable to plan of care.   Ruth Cove, MD 10/13/23 2124

## 2023-10-13 NOTE — ED Notes (Signed)
 Pt ABCs intact. RR even and unlabored. Pt in NAD. Bed in lowest locked position. Call bell in reach. Denies needs at this time.

## 2023-10-13 NOTE — ED Provider Notes (Signed)
 [facility] Provider Note   Event Date/Time   First MD Initiated Contact with Patient 10/13/23 1345     (approximate) History  Facial Droop  HPI Jared Walker is a 73 y.o. male with past medical history of glaucoma status post surgery, hypertension, and diabetes who presents complaining of right-sided facial droop, left hand paresthesia and left foot paresthesia that has been worsening over the last week.  Patient states he is also having worsening vision changes of blurry vision but states that this may be secondary to his glaucoma. ROS: Patient currently denies any vision changes, tinnitus, difficulty speaking, facial droop, sore throat, chest pain, shortness of breath, abdominal pain, nausea/vomiting/diarrhea, dysuria, or weakness/numbness in any extremity   Physical Exam  Triage Vital Signs: ED Triage Vitals  Encounter Vitals Group     BP 10/13/23 1223 (!) 180/95     Systolic BP Percentile --      Diastolic BP Percentile --      Pulse Rate 10/13/23 1223 65     Resp 10/13/23 1223 18     Temp 10/13/23 1223 98.3 F (36.8 C)     Temp Source 10/13/23 1223 Oral     SpO2 10/13/23 1223 97 %     Weight 10/13/23 1223 217 lb (98.4 kg)     Height 10/13/23 1223 5\' 11"  (1.803 m)     Head Circumference --      Peak Flow --      Pain Score 10/13/23 1222 9     Pain Loc --      Pain Education --      Exclude from Growth Chart --    Most recent vital signs: Vitals:   10/13/23 2050 10/13/23 2100  BP: (!) 196/110 (!) 189/75  Pulse: 75 73  Resp: 14 16  Temp:    SpO2: 98% 92%   General: Awake, oriented x4. CV:  Good peripheral perfusion.  Resp:  Normal effort.  Abd:  No distention.  Other:  Elderly overweight African-American male resting comfortably in no acute distress.  Right-sided facial droop, paresthesias of left hand and foot ED Results / Procedures / Treatments  Labs (all labs ordered are listed, but only abnormal results are displayed) Labs  Reviewed  PROTIME-INR - Abnormal; Notable for the following components:      Result Value   Prothrombin Time 15.3 (*)    All other components within normal limits  CBC - Abnormal; Notable for the following components:   Platelets 121 (*)    All other components within normal limits  COMPREHENSIVE METABOLIC PANEL WITH GFR - Abnormal; Notable for the following components:   Glucose, Bld 68 (*)    Creatinine, Ser 2.61 (*)    AST 54 (*)    ALT 83 (*)    Total Bilirubin 1.3 (*)    GFR, Estimated 25 (*)    All other components within normal limits  CBG MONITORING, ED - Abnormal; Notable for the following components:   Glucose-Capillary 57 (*)    All other components within normal limits  CBG MONITORING, ED - Abnormal; Notable for the following components:   Glucose-Capillary 64 (*)    All other components within normal limits  APTT  DIFFERENTIAL  ETHANOL  CBG MONITORING, ED   EKG ED ECG REPORT I, Charleen Conn, the attending physician, personally viewed and interpreted this ECG. Date: 10/13/2023 EKG Time: 1254 Rate: 63 Rhythm: normal sinus rhythm QRS Axis: normal Intervals: normal ST/T Wave abnormalities:  normal Narrative Interpretation: no evidence of acute ischemia RADIOLOGY ED MD interpretation: CT of the head without contrast interpreted by me shows no evidence of acute abnormalities including no intracerebral hemorrhage, obvious masses, or significant edema -Agree with radiology assessment Official radiology report(s): MR BRAIN WO CONTRAST Result Date: 10/13/2023 CLINICAL DATA:  Neuro deficit, concern for stroke, recent right eye surgery. Blurry vision to both eyes worse for 1 week. Right facial droop. EXAM: MRI HEAD WITHOUT CONTRAST TECHNIQUE: Multiplanar, multiecho pulse sequences of the brain and surrounding structures were obtained without intravenous contrast. COMPARISON:  Earlier same day CT head.  MRI head 12/01/2016. FINDINGS: Brain: No acute infarct. There are  multiple foci of susceptibility within the cerebral hemispheres particularly within the right occipital and right temporal lobes. Additional foci of susceptibility in the right frontal and right parietal lobes, posterolateral left temporal lobe, and right thalamus. Scattered and confluent T2/FLAIR hyperintensity in the periventricular and subcortical white matter. Remote lacunar infarcts in the left caudate head and right para median pons. Similar appearance of multiple prominent perivascular spaces in the bilateral frontal lobes with mild associated gliosis. Small remote infarct in the left temporal periventricular white matter. Mild parenchymal volume loss. No edema, mass effect, or midline shift. Posterior fossa is unremarkable. Normal appearance of midline structures. The basilar cisterns are patent. No extra-axial fluid collections. Ventricles: Normal size and configuration of the ventricles. Vascular: Skull base flow voids are visualized. Skull and upper cervical spine: No focal abnormality. Sinuses/Orbits: Sequelae of bilateral lens replacement. Orbits are symmetric. Mild mucosal thickening in the left posterior ethmoid air cells. Other: Mastoid air cells are clear. IMPRESSION: No acute intracranial abnormality. Multiple scattered foci of susceptibility in the cerebral hemispheres which appear new since 2018. Distribution favors cerebral amyloid angiopathy. Hypertensive encephalopathy is an additional consideration. Chronic microvascular ischemic changes. Multiple remote infarcts as above. Mild parenchymal volume loss. Electronically Signed   By: Denny Flack M.D.   On: 10/13/2023 20:55   CT HEAD WO CONTRAST Result Date: 10/13/2023 CLINICAL DATA:  Stroke, follow-up. Right-sided facial droop vision changes and numbness in the left hand and left foot. EXAM: CT HEAD WITHOUT CONTRAST TECHNIQUE: Contiguous axial images were obtained from the base of the skull through the vertex without intravenous contrast.  RADIATION DOSE REDUCTION: This exam was performed according to the departmental dose-optimization program which includes automated exposure control, adjustment of the mA and/or kV according to patient size and/or use of iterative reconstruction technique. COMPARISON:  CT of the head without contrast 07/17/2017. FINDINGS: Brain: Progressive periventricular and scattered subcortical white matter hypoattenuation is mildly advanced for age. No acute infarct, hemorrhage, or mass lesion is present. The ventricles are of normal size. No significant extraaxial fluid collection is present. Deep brain nuclei are within normal limits. The brainstem and cerebellum are within normal limits. Midline structures are within normal limits. Vascular: Atherosclerotic calcifications are present within the cavernous internal carotid arteries bilaterally. No hyperdense vessel is present. Skull: Calvarium is intact. No focal lytic or blastic lesions are present. No significant extracranial soft tissue lesion is present. Sinuses/Orbits: Bilateral lens replacements are noted. Globes and orbits are otherwise unremarkable. IMPRESSION: 1. No acute intracranial abnormality or significant interval change. 2. Progressive periventricular and scattered subcortical white matter hypoattenuation is mildly advanced for age. This likely reflects the sequela of chronic microvascular ischemia. Electronically Signed   By: Audree Leas M.D.   On: 10/13/2023 13:24   PROCEDURES: Critical Care performed: No Procedures MEDICATIONS ORDERED IN ED: Medications - No data  to display  IMPRESSION / MDM / ASSESSMENT AND PLAN / ED COURSE  I reviewed the triage vital signs and the nursing notes.                             The patient is on the cardiac monitor to evaluate for evidence of arrhythmia and/or significant heart rate changes. Patient's presentation is most consistent with acute presentation with potential threat to life or bodily  function. Patient is a 73 year old male who presents with symptoms concerning for possible CVA/TIA PMH risk factors: Hypercholesterolemia, hypertension, remote tobacco abuse Neurologic Deficits: Right facial droop, left upper and lower extremity paresthesias Last known Well Time: 1 week prior to arrival Given History and Exam I have lower suspicion for infectious etiology, neurologic changes secondary to toxicologic ingestion, seizure, complex migraine. Presentation concerning for possible stroke requiring workup.  Workup: Labs: POC glucose, CBC, BMP, LFTs, Troponin, PT/INR, PTT, Type and Screen Other Diagnostics: ECG, CXR, MRI brain Interventions: Patient's not eligible for TPA due to time from onset  Consult: Spoke with Dr. Lindzen in neurology who recommends MRI brain with admission if positive and discharge if not Disposition: Care of this patient will be signed out to the oncoming physician at the end of my shift.  All pertinent patient information conveyed and all questions answered.  All further care and disposition decisions will be made by the oncoming physician.   FINAL CLINICAL IMPRESSION(S) / ED DIAGNOSES   Final diagnoses:  Facial droop   Rx / DC Orders   ED Discharge Orders     None      Note:  This document was prepared using Dragon voice recognition software and may include unintentional dictation errors.   Edwena Mayorga K, MD 10/14/23 [phone]

## 2023-10-27 NOTE — Unmapped (Signed)
 [facility] Refill Coordination Note    Specialty Medication(s) to be Shipped:   Infectious Disease: Pifeltro and Tivicay    Other medication(s) to be shipped: No additional medications requested for fill at this time     Samuel [name], DOB: 22-Jun-1950  Phone: [phone] (home)       All above HIPAA information was verified with patient.     Was a Nurse, learning disability used for this call? No    Completed refill call assessment today to schedule patient's medication shipment from the [facility]  [phone]).  All relevant notes have been reviewed.     Specialty medication(s) and dose(s) confirmed: Regimen is correct and unchanged.   Changes to medications: Lando reports no changes at this time.  Changes to insurance: No  New side effects reported not previously addressed with a pharmacist or physician: None reported  Questions for the pharmacist: No    Confirmed patient received a Conservation officer, historic buildings and a Surveyor, mining with first shipment. The patient will receive a drug information handout for each medication shipped and additional FDA Medication Guides as required.       DISEASE/MEDICATION-SPECIFIC INFORMATION        N/A    SPECIALTY MEDICATION ADHERENCE     Medication Adherence    Patient reported X missed doses in the last month: 0  Specialty Medication: PIFELTRO 100 mg Tab (doravirine)  Patient is on additional specialty medications: Yes  Additional Specialty Medications: TIVICAY 50 mg TABLET (dolutegravir)  Patient Reported Additional Medication X Missed Doses in the Last Month: 0  Patient is on more than two specialty medications: No              Were doses missed due to medication being on hold? No    Pifeltro 100 mg: 3 days of medicine on hand   Tivicay 50 mg: 3 days of medicine on hand     REFERRAL TO PHARMACIST     Referral to the pharmacist: Not needed      [facility]     Shipping address confirmed in Epic.     Cost and Payment: Patient has a $0 copay, payment information is not required.    Delivery Scheduled: Yes, Expected medication delivery date: 11/01/23.     Medication will be delivered via Same Day Courier to the prescription address in Epic WAM.    [name], [facility]   [facility]  Specialty Pharmacist

## 2023-11-01 MED FILL — TIVICAY 50 MG TABLET: ORAL | 30 days supply | Qty: 30 | Fill #9

## 2023-11-01 MED FILL — PIFELTRO 100 MG TABLET: ORAL | 30 days supply | Qty: 30 | Fill #8

## 2023-11-18 DIAGNOSIS — E1140 Type 2 diabetes mellitus with diabetic neuropathy, unspecified: Principal | ICD-10-CM

## 2023-11-18 DIAGNOSIS — R768 Other specified abnormal immunological findings in serum: Principal | ICD-10-CM

## 2023-11-18 DIAGNOSIS — R918 Other nonspecific abnormal finding of lung field: Principal | ICD-10-CM

## 2023-11-18 DIAGNOSIS — I214 Non-ST elevation (NSTEMI) myocardial infarction: Principal | ICD-10-CM

## 2023-11-18 DIAGNOSIS — Z9189 Other specified personal risk factors, not elsewhere classified: Principal | ICD-10-CM

## 2023-11-18 DIAGNOSIS — I5032 Chronic diastolic (congestive) heart failure: Principal | ICD-10-CM

## 2023-11-18 DIAGNOSIS — Z79899 Other long term (current) drug therapy: Principal | ICD-10-CM

## 2023-11-18 DIAGNOSIS — E66811 Obesity (BMI 30.0-34.9): Principal | ICD-10-CM

## 2023-11-18 DIAGNOSIS — K760 Fatty (change of) liver, not elsewhere classified: Principal | ICD-10-CM

## 2023-11-18 DIAGNOSIS — Z7189 Other specified counseling: Principal | ICD-10-CM

## 2023-11-18 DIAGNOSIS — B20 Human immunodeficiency virus [HIV] disease: Principal | ICD-10-CM

## 2023-11-18 DIAGNOSIS — R7989 Other specified abnormal findings of blood chemistry: Principal | ICD-10-CM

## 2023-11-18 DIAGNOSIS — H5989 Other postprocedural complications and disorders of eye and adnexa, not elsewhere classified: Principal | ICD-10-CM

## 2023-11-18 DIAGNOSIS — Z0184 Encounter for antibody response examination: Principal | ICD-10-CM

## 2023-11-18 DIAGNOSIS — H547 Unspecified visual loss: Principal | ICD-10-CM

## 2023-11-18 DIAGNOSIS — Z5181 Encounter for therapeutic drug level monitoring: Principal | ICD-10-CM

## 2023-11-18 NOTE — Unmapped (Signed)
 Samuel [name] at [employer] Checklist     Type of visit:  video    Are you located in [place]? yes    Reason for visit: Follow up    Questions / Concerns that need to be addressed: none stated    General Consent to Treat (GCT) for epic video visits only: Verbal consent    HCDM reviewed and updated in Epic:    We are working to make sure all of our patients??? wishes are updated in Epic and part of that is documenting a Environmental health practitioner for each patient  A Health Care Decision Maker is someone you choose who can make health care decisions for you if you are not able - who would you most want to do this for you????  is already up to date.    HCDM (patient stated preference): Samuel [name],Samuel [name] - Spouse - [phone]    COVID-19 Vaccine Summary  Which COVID-19 Vaccine was administered  Moderna  Type:  Dates Given:  03/19/2023                   If no: Are you interested in scheduling? Declines vaccine

## 2023-11-18 NOTE — Unmapped (Signed)
 Good to see you today!    Your most recent CD4 count was:  Absolute CD4 Count   Date Value Ref Range Status   05/04/2023 782 510 - 2,320 /uL Final     CD4 T Cell Abs   Date Value Ref Range Status   01/02/2021 860 359 - 1,519 /uL Final     As long as your CD4 count is over the 200-300 range, you're OK.      Your most recent viral load was:  HIV RNA Quant Result   Date Value Ref Range Status   05/04/2023 Detected (A) Not Detected Final     HIV RNA   Date Value Ref Range Status   05/04/2023 <20 (H) <0 copies/mL Final     Remember the goal of your treatment is to keep the amount of virus in your blood very, very low.  When you take your medications consistently, most of the time it will say Not Detected in the HIV RNA Quant Result box.  If it says Detected, then take a look at the next box that says HIV RNA and find the row that matches the date for the HIV RNA Quant Result box.  If the number is really small (less than 50), there's nothing to worry about. This means the test picked up a tiny amount of virus that doesn't mean anything bad.  If the number is bigger than 1,500, then this means that there was a substantial amount of virus in your blood AND that it was possible for you to transmit HIV to others.    If you have any questions about any of your test results, please send Dr. Jewel a message in MyChart.

## 2023-11-18 NOTE — Unmapped (Signed)
 [facility]  [address]  [place], [place]  [address]  P [phone]  F [phone]     PCP:    [name], MD   (hahn-DAY)    Care team:  Raphael Carrier, MD (Cardiology)     Pricilla Elna Dawn, MD (Nephrology)     Charlanne Bushy, MD (Ophtho @ [facility])    Last visit with me:  05/2023    7.74m video  20 phone    I spent 28 minutes on real-time audio and video (7.78m) and 20.5 on a phone call with the patient (switched over d/t technical issue w/video). I spent an additional 47 minutes on pre- and post-visit activities.     The patient was physically located in [place]  or a state in which I am permitted to provide care. The patient understood that s/he may incur co-pays and cost sharing, and agreed to the telemedicine visit. The visit was completed via phone and/or video, which was appropriate and reasonable under the circumstances given the patient's presentation at the time.    The patient has been advised of the potential risks and limitations of this mode of treatment (including, but not limited to, the absence of in-person examination) and has agreed to be treated using telemedicine. The patient's/patient's family's questions regarding telemedicine have been answered.     If the phone/video visit was completed in an ambulatory setting, the patient has also been advised to contact their provider???s office for worsening conditions, and seek emergency medical treatment and/or call 911 if the patient deems either necessary.        Assessment/Plan:      HIV (dx'd 12/1999, nadir CD4 239 / 30% in 06/2011)  - chronic, stable    Current regimen:   dolutegravir and doravirine    ARV hx  Atripla (first regimen; rx'd 09/2007) - ~2010 - 07/2015  Descovy + Tivicay (d/t CKD) - 07/2015 - 12/2020  ABC 600/d + 3TC 100/d + DTG 50/d    - 12/2020 - 01/2021   (I didn't feel right - mostly GI symptoms)  Descovy + Tivicay - 01/2021 - 04/2022  Tivicay + Pifeltro - 04/2022 - present    Med access via Medicare.       Absolute CD4 Count   Date Value Ref Range Status   05/04/2023 782 510 - 2,320 /uL Final     CD4% (T Helper)   Date Value Ref Range Status   05/04/2023 34 34 - 58 % Final     HIV RNA Quant Result   Date Value Ref Range Status   05/04/2023 Detected (A) Not Detected Final     HIV RNA   Date Value Ref Range Status   05/04/2023 <20 (H) <0 copies/mL Final        Resistance hx  06/2002 - PR: 71T  01/2005 - PR: 41K, 63P, 71T -- RT: 196E, 214F  09/2007 - PR: 93P, 63S, 71T -- RT: 333E  07/2008 - PR: 63P, 63S, 71T -- RT: 333E  05/2023 - cumulative above with no significant resistance - [facility] HIVdb permanent link       03/2022 - Doing OK overall - tolerated the switch back to Descovy from split ABC + 3TC w/o difficulty.   07/2022 - He tolerated the switch to Pifeltro well - no issues. Takes these at nighttime, before bedtime. Looked at potential issues w/doravirine or dolutegravir with iHD, and they both seem to be OK - though dosing after HD would  probably be advisable.  05/2023 - labs drawn at Dr Orpah appt on 03 Dec - CD4 count stable and RNA detected but <20, indicating ongoing stability on NRTI-sparing regimen of DOR and DTG. No issues w/refills or adherence.  10/2023 - no issues w/getting Rxs filled on time    CD4 count over 300 for >2Y on suppressive ART; no prophylaxis needed; recheck CD4 annually  Discussed ARV adherence  HIV RNA and safety labs (brief return panel) -- to be drawn @ next visit w/Dr [facility]  Continue current therapy  Encouraged continued excellent ARV adherence      Visual impairment, acute  - acute, complicated   10/2023 - Keean says could be better. Says the healing process hasn't been good. His wife says Dr Charlanne was the person for the eyes. His eyes are not healing very well. In fact he's losing his sight. Going back 6/24, not sure what's going to happen. Been dealing with this since January. April was the second surgery (on the other eye). I asked what the issue with healing was, exactly has had some bleeding & redness. His wife wanted to make sure this didn't have anything to with his HIV - reassurance provided, based on his CD4 counts over time. He's having difficulty w/his phone bc he can't swipe to do tasks. They're looking at getting a shower chair. He has a cane for ambulation, but he's having difficulty with sussing out distance to steps, curbs. Provided some info re: keeping receipts for DME (e.g., shower chair, grab bars, etc.) for possible reimbursement. I reviewed records via [facility]Everywhere after I disconnected w/them - most recent encounter 10/19/2023 (Drs Cesario & Charlanne). Dxs were of primary open-angle glaucoma OU, severe/advanced at presentation, s/p cataract extraction and intraocular lens implant (CE/LOI) of L eye with trabeculectomy on 27Jan2025; had a mild trauma (granddaughter poked in left eye) prior to f/u on 07/13/2023. At that visit, vision in R eye was subjectively better. Had add'l f/u visits on 08/03/2023 and 08/31/2023 - left eye at that visit had still some fibrin on IOL. Underwent CE/IOL and trabeculectomy of RIGHT eye on 09/27/2023. Adherence to gtts was unclear after this, per f/us  on 5/6 and 5/13. Wound dehiscence noted on nasal margin of incision area on 10/12/2023 visit. By 5/20 had a mucus plug at the area of dehiscence and a 1.44mm layered hyphema. The hyphema was still present at 5/27 visit - and he had completed imaging showing multiple chronic infarcts (see below). I asked about things to potentially help improve his safety - and specifically inquired about pre-packaged med dose packs (ie, adherence packaging). They were unaware of this option but both felt like this would be of great value to them. I went to [facility] ([email or web address]) and searched for locations in [place] that offer delivery and adherence packaging. There are two: [employer] and [employer]. I'm going to ask our clinic pharmD, Dr. Ranny, for some assistance in getting this set up for them. I think we'll need medication reconciliation first and then once we have a defined and accurate med list, we can coordinate with one of those two pharmacies to prepare dose-packs for [place].  Asking our pharmD, Dr Ranny, for help in dose/adherence pack preparation (see above).      Neurocognitive concerns - chronic, progressive   05/2023 - Had extended discussion with pt and his wife about concerns she expressed in visit re: lack of motivation, forgetfulness, and perceived changes in his attention/engagement. He feels  the onset was around time of worsening of his vision, and describes not being able to do things that he enjoyed doing previously, bc of difficulty with visual acuity (e.g., texting affirmations and messages to loved ones; reading; driving). This has also introduced some add'l stress in form of caretaking responsibilities on his wife. Discussed pros/cons of baseline neurocognitive testing given high prevalence of neurocognitive dysfunction among people who have lived w/HIV for a long time (HAND). Here, DDx I think is fairly broad; I think depression or adjustment disorder would probably be highest, followed by med effects (diazepam, dicyclomine, pregabalin, ??-blockade), thyroid dz, possible early manifestations of ESRD, and HAND. Proposed a plan to get thru his eye procedures and see how things improve after this - then pursue add'l evals/workups after this, incrementally. Both he and his wife said this sounded reasonable to them.  10/2023 - At a late May f/u visit post-op, Dr Charlanne was concerned about a facial droop, referred to PCP. Dr Sadie looked at him and routed him to the ED. They did a CT and an MRI. From this, they had found mini strokes, and a lot of them, but there were none active and they were old. He is very weak in his body. Dr Sadie did about 3 weeks of [PT] with him at the house.  They also did some HH-based medication reconciliation. We discussed that knowing Rexford has had some chronic infarcts is helpful in some ways to explain some of the neurocog issues that he and his wife conveyed to me at 05/2023 visit.  Continue to monitor      CKD stage G4/A2, pre-transplant  - chronic, progressive     eGFR CKD-EPI (2021) Male   Date Value Ref Range Status   09/07/2023 24 (L) >=60 mL/min/1.44m2 Final     Comment:     eGFR calculated with CKD-EPI 2021 equation in accordance with [employer] and [employer] of Nephrology Task Force recommendations.     Creatinine   Date Value Ref Range Status   09/07/2023 2.77 (H) 0.73 - 1.18 mg/dL Final     Cystatin C   Date Value Ref Range Status   03/12/2022 2.26 (H) 0.64 - 1.23 mg/L Final       03/2022 - Initial transplant eval 07/2020. Most recent visit w/[facility] 08/2021. Per HPI, doing well in interim. BP 133/79. Slow progression. No indication for HD. He is to complete a colonoscopy as the final phase of his transplant evaluation. Plan = RTC 67m (planned for 03/26/2022). With respect to his ARVs, the problem is that now his eGFR is consistently under 30 mL/min, and use of fixed-dose Descovy isn't recommended; the threshold for TAF by itself is 15 mL/min, (so it's OK) but for FTC it's 30 mL/min as the lower threshold before interval-spacing is required (for him, it'd be 200 mg every 72h). He's already having challenges keeping up w/his meds, so I'm concerned about making any changes at this time. I'm also a little concerned about possible influence of dolutegravir on creatinine secretion here and it maybe spuriously making his actual eGFR look worse - so checking a cystatin C seems reasonable to try to triangulate where he is currently.   07/2022 - Cystatin C at 03/2022 visit was in agreement w/SCr for eGFR. Sat Dr Pricilla in f/u most recently on 26 Jan. Per note, xp eval is ongoing and pt favors HD over PD, when time comes; holding on referral for AVF creation.  05/2023 - had appt  w/Dr [facility] in early December, no changes. Plan remained to pursue PD first before iHD. He says the plan is to still watch and wait - no plans for imminent start of PD.  10/2023 - Most recent f/u w/Dr [facility] was on 08Apr. SCr continued to slowly trend upward, was at 3.10 on 7Mar2025. No major changes in plan per note.  Appreciate ongoing mgm't from Dr Pricilla      Liver disease  - HCV, genotype 1a, dx'd 1990s and cured s/p ELB/GRZ x12 weeks in 2017  - RESOLVED  - Chronic transaminitis and declining platelets c/f NASH (08/2018 - present)  - chronic, stable     Lab Results   Component Value Date    AST 22 05/04/2023    AST 92 (A) 08/04/2022    AST 57 (H) 06/26/2022    AST 55 (H) 03/12/2022    AST 52 (H) 09/23/2021     Lab Results   Component Value Date    ALT 34 05/04/2023    ALT 134 (A) 08/04/2022    ALT 81 (H) 06/26/2022    ALT 81 (H) 03/12/2022    ALT 106 (H) 09/23/2021     Platelet   Date Value Ref Range Status   05/04/2023 117 (L) 150 - 450 10*9/L Final   08/06/2022 127 (L) 150 - 450 10*9/L Final   06/26/2022 103 (L) 150 - 450 10*9/L Final   03/12/2022 138 (L) 150 - 450 10*9/L Final   09/23/2021 128 (L) 150 - 450 10*9/L Final     03/2022 - Liver bx on 08Dec2006 showed grade 2 stage 2. EGD 09/2015 was w/o varices (normal plts @ time per Dr Norbert note from 04/06/2016). Because of his CKD, he was treated with 12 weeks of ELB/GRZ. HCV RNA at start of tx in 01/2016 was 328K, down to ND by 05/2016 and ND on rechecks in 07/2016, 09/2016, 11/2017, 12/2019, and 06/2021. Most recent GI f/u was in Jan 2023 [facility] NP) - per her note, Fibroscan in clinic was now F0-1 from [facility] pre-treatment. Overall assessment was for NASH. Plan = RTC 5-65m (summer 2023) - no f/u in future appts. Today, doing fine - no interval issues reported  07/2022 - No interim visits w/[facility]. LFTs checked this past Monday by PCP and manually entered in Epic so they appear alongside other values - see above - a bit higher than most recent checks but not markedly out  05/2023 - transaminases markedly improved between 07/2022 and 05/2023 assessments  10/2023 - no interim concerns  Check labs w/next - will ask Dr Pricilla if these can be collected @ time of next f/u      Cardiac disease  - HFpEF (EF 60-65% on TTE 09/2019)  - chronic, stable   - CAD s/p NSTEMI 10/2015 (LHC w/ multivessel dx - no stent, med mgm't) - chronic, stable   - Aortic sclerosis  - chronic, stable   - Difficult to control HTN  - chronic, stable   03/2022 - last visit w/[facility] 11/2019 [facility] MD). Reported 59m worsening fatigue, was referred for home sleep study given c/f OSA. Carvedilol, ASA 81, atorva 20, torsemide 20, metolazone 10, spirono 25, amlo 7.5, atenolol 100, doxazosin 4, lisino 40, hydralazine 100 bid all cont'd. Plan was RTC in 61m (early 2022) - no f/u in future appts. Today, feeling well - no CP/DOE reported. He's walking frequently but no signif other exercise.  07/2022 - saw cards fellow Dr Skeeter on 02Feb for f/u -  continuing all current meds.   05/2023 - no chest pain or pressure, he initially denies any issues w/exercise tolerance from a cardiopulm perspective but then says he's having some DOE, mild  10/2023 - most recent f/u on 09/03/2023 ([facility]). No major changes - continuing on atenolol 100/d, aspirin 81/d, torsemide 40/d and spironolactone 25/d, amlo 10/d, lisino 40/d, hydralazine 100 bid, doxazosin 4/d  Appreciate ongoing assistance & mgm't from cardiology      Pulmonary disease  - Abnormal chest CTs (4mm RLL nodule stable 03/2020 -> 07/2021, ?fibrotic changes 07/2021, 09/2021)  - chronic, stable  - 15 PY smoking hx, quit 1983  - chronic, stable    03/2022 - Saw Interventional Pulm in 09/2021. CTs reviewed, they felt the RLL nodule was of no signif concern given stability of 2Y timeline - but ordered HRCT given lower lung fibrotic vs atelectatic changes. HRCT completed 16May2023 with impression: Persistent but improved lower lung predominant subpleural reticular abnormality which may represent residual mild fibrosis, possibly from resolving organizing pneumonia. Representative cut (138/239) below. Plan for f/u not described in note.  Today, doing well - no interim issues.        07/2022 - no interim issues though he did describe briefly feeling sometimes a bit short of breath -  but nothing on a consistent basis and he's able to exercise on a regular basis (walking)  05/2023 - no cough or sputum production  10/2023 - no interim concerns  Revisit @ future RTC      Orthopaedic issues  - s/p ORIF R acetabular fx 1990s (@ [facility]), c/b displaced screw in quadriceps (chronic)  - chronic, stable  - s/p lumbar spinal surgery 1990s, c/b chronic LBP  - chronic, stable  - BLE numbness involving both feet, proximally to knees  - chronic, stable  03/2022 - Saw Ortho most recently 07/2020 (Obudzinski/[facility]). Today, doing OK - just describes ongoing issues w/neuropathic discomfort in BLEs.  07/2022 - still describing neuropathy sxs and a general sense of heaviness in BLEs, w/o any s/sxs of claudication; he says the pregabalin is maybe helping more than the gaba had  05/2023 - still w/signif interim discomfort related to neuropathy and BLE discomfort  10/2023 - he says his back pain and neuropathy are stable  monitor      Endocrine & metabolic issues  - T2DM without use of insulin (A1c 5.9% in 07/2023)  - chronic, stable   - Obesity (BMI 30.5)  - chronic, progressive   - Gout  - chronic, stable   - Abnormal TFTs (low TSH 10/2019 @ [facility], 03/2021 @ [facility])  - chronic, stable   - Abnormal lipids, on statin (HDL 33, LDL 36 in 03/2021 @ [facility])  - chronic, stable   - Normal 25(OH)D level 10/2019  - chronic, stable     Wt Readings from Last 4 Encounters:   09/07/23 98.9 kg (218 lb)   09/03/23 100.4 kg (221 lb 6.4 oz)   05/20/23 (!) 102.2 kg (225 lb 6.4 oz)   05/07/23 (!) 101.6 kg (224 lb)   ...  09/30/21 99.3 kg (219 lb)   11/12/20 98.2 kg (216 lb 6.4 oz)   10/10/19 (!) 104.3 kg (230 lb) 07/14/18 (!) 102.9 kg (226 lb 14.4 oz)   11/02/17 (!) 102.3 kg (225 lb 9.6 oz)      Free T4   Date Value Ref Range Status   03/12/2022 0.86 (L) 0.89 - 1.76 ng/dL Final     T3, Free  Date Value Ref Range Status   03/12/2022 3.07 2.30 - 4.20 pg/mL Final     TSH   Date Value Ref Range Status   08/06/2023 0.767 uIU/mL Final     Comment:     [facility] via [facility] - manually added     Lab Results   Component Value Date    VITDTOTAL 59.4 08/06/2022        03/2022 - Re: T2DM and elevated A1c, he continues on current DM meds w/o any reported missed doses or issues. Re: weight/BMI, stable from 09/2021. Re: gout, no interval episodes. Re: thyroid symptoms, no palpitations, subjective warmth/cold, energy levels, etc.   07/2022 - TFTs from 03/2022 visit had FT4 and TSH slightly below range, FT3 WNL. No specific sxs today  05/2023 - Progression of neurocognitive issues over time is somewhat concerning but no other clear manifestations of thyroid or metabolic dz today - also recently had blood work done so holding on add'l assessments  10/2023 - Had TSH done in 08/06/2023 through [facility]) -- was 0.767 uIU/mL. A1c was also checked and it was 5.9% (avg 123). Manually added these so they appear alongside other results in [email or web address] .   Defer mgm't to Dr Sadie & team @ [facility]      Sexual health & secondary prevention  - chronic, stable  Married (wife).  NEED TO INQUIRE @ FUTURE RTC      Lab Results   Component Value Date    RPR Nonreactive 05/04/2023    RPR Nonreactive 03/12/2022    CTNAA Negative 06/08/2018    GCNAA Negative 06/08/2018    SPECSOURCE Urine (Male) 06/08/2018       GC/CT NAATs - not being checked routinely for this patient  RPR - repeat 12 months after prior      Health maintenance    Diabetes                       (2024 ADA SoC)  A1c at goal of <7% as of 07/2022  defer mgm't to PCP   Lab Results   Component Value Date    GLU 87 09/07/2023    A1C 5.9 08/06/2023    A1C 6.8 08/04/2022    A1C 6.5 (H) 09/23/2021 Liver health                   (APRI & FIB-4)  suspicion for MAFLD high  monitor over time   Lab Results   Component Value Date    ALT 34 05/04/2023    ALT 134 (A) 08/04/2022    ALT 81 (H) 06/26/2022    PLT 117 (L) 05/04/2023    PLT 127 (L) 08/06/2022    PLT 103 (L) 06/26/2022          ASCVD risk reduction   (REPRIEVE)  baseline risk score >=20%  Has been on atorva since 2017 (40 mg since 07/2018)    AAA screening  assessment needed but deferred to future visit   Lab Results   Component Value Date    LDL 19 08/04/2022    TRIG 838 08/04/2022       The 10-yr ASCVD Risk score (Goff DC Jr., et al., 2013) failed to calculate due to the following reason:  The 2013 10-yr ASCVD risk score is only valid if the patient does not have prior/existing clinical ASCVD (myocardial infarction, stroke, CABG, coronary angioplasty, angina or peripheral arterial disease, coronary atherosclerosis, ischemic heart disease, or cerebrovascular disease).  The patient has prior/existing ASCVD.  Had Heart Attack  Has Coronary Atherosclerosis           Kidney health  defer mgm't to Nephrology   Lab Results   Component Value Date    CREATININE 2.77 (H) 09/07/2023    PROTEINUA Negative 05/04/2023    PROTEINUR 6.9 03/05/2020    GLUCOSEU Negative 05/04/2023    ALBCRERAT 8.1 09/29/2022    PCRATIOUR 0.075 03/05/2020          Bone health                    (FRAX)  Patient's height in cm: 180.3  FRAX score before DEXA: 14% (date: 20 May 2023)  Needs DEXA   Lab Results   Component Value Date    VITDTOTAL 59.4 08/06/2022          Communicable diseases  TB screening  no longer needed; negative IGRA, low risk    Viral hepatitis screening  does not meet screening criteria; no further screening indicated   Quantiferon TB Gold Plus Interpretation   Date Value Ref Range Status   01/29/2020 Negative Negative Final     QFT TB GLD   Date Value Ref Range Status   07/01/2011 NEGATIVE  Final     Hep A IgG   Date Value Ref Range Status   01/29/2020 Reactive (A) Nonreactive Final     Hep B Surface Ag   Date Value Ref Range Status   01/31/2020 Nonreactive Nonreactive Final     Hep B S Ab   Date Value Ref Range Status   01/29/2020 Nonreactive Nonreactive, Grayzone Final     Comment:     Nonreactive and Grayzone results are considered non-immune.     Hep B Core Total Ab   Date Value Ref Range Status   01/31/2020 Reactive (A) Nonreactive Final     Hepatitis C Ab   Date Value Ref Range Status   01/29/2020 Reactive (A) Nonreactive Final     HCV RNA   Date Value Ref Range Status   06/06/2021 Not Detected Not Detected Final     HCV RNA (IU)   Date Value Ref Range Status   02/05/2016 328,448 (H) <12 IU/mL Final   08/01/2014 719603 [IU]/mL Final          Vaccine-preventable illnesses  MMR & VZV screening  no further assessment needed    Vaccines needed           (CDC Adult Schedule)  Heplisav-B x1  MenACWY 1?? series (2 doses - 0 and 16m)  PCV21 (1 dose >5Y after series)    Hepatitis B core total Ab positive and sAb negative    One dose of Heplisav-B is indicated per most recent HIVMA guidelines - with recheck of HBsAb after           Rubella IgG Scr   Date Value Ref Range Status   01/29/2020 Positive  Final     Comment:     A result of Positive or Negative is determined according to the CLSI I/LA6-A recommended cutoff of 10 IU/mL established with the calibrators.     Varicella IgG   Date Value Ref Range Status   01/29/2020 Positive  Final       Most Recent Immunizations   Administered Date(s) Administered    COVID-19 VAC,BIVALENT,MODERNA(BLUE CAP) 01/23/2022    COVID-19 VACCINE,MRNA(MODERNA)(PF) 07/27/2020    Covid-19 Vac, (46yr+) (Spikevax) Monovalent Moderna 03/19/2023    HEPATITIS  B VACCINE ADULT, ADJUVANTED, IM(HEPLISAV B) 01/31/2020    HEPATITIS B VACCINE ADULT,IM(ENERGIX B, RECOMBIVAX) 08/24/2016    INFLUENZA ADJUVANTED PF, IIV3(73YR UP)(FLUAD) 03/19/2023    INFLUENZA INJ MDCK PF, QUAD,(FLUCELVAX)(36MO AND UP EGG FREE) 02/28/2019    INFLUENZA QUAD ADJUVANTED 73YR UP(FLUAD) 01/23/2022    INFLUENZA TIV (TRI) PF (IM)(HISTORICAL) 03/25/2011    Influenza Vaccine Quad(IM)6 MO-Adult(PF) 02/26/2021    Influenza Virus Vaccine, unspecified formulation 03/31/2017    PNEUMOCOCCAL POLYSACCHARIDE 23-VALENT 12/01/2017    PPD Test 03/25/2011    Pneumococcal Conjugate 13-Valent 04/13/2012    RSV VACCINE,ADJUVANTED(PF)(73YRS+)(AREXVY) 08/12/2022    SHINGRIX-ZOSTER VACCINE (HZV),RECOMBINANT,ADJUVANTED(IM) 12/01/2017    TdaP 10/27/2018    Tuberculin Skin Test;unspecified Formulation 03/25/2011          Cancer screening  Anorectal   assessment needed but deferred to future visit    Colorectal  S/p FIT+DNA 10/2020 (neg) but per Dr Pricilla needs c-scope prior to possible transplantation    Liver  S/p US  06/2020 - neg    Lung  screening not indicated    Prostate  screening not indicated   Lab Results   Component Value Date    PSASCRN 0.4 07/09/2010    PSA 0.35 01/29/2020              Oral health  has no  dentist  last exam - long time ([facility] - dentures up/low)  His bottom plate is too loose; broke upper plate (as of 87/80/7975)   Eye health  does  use corrective lenses  last exam - 01/2023  Pending procedures for POAG and cataracts (thru [facility])       Information above was last reviewed & updated: 18 Nov 2023         I personally spent 75 minutes face-to-face and non-face-to-face in the care of this patient, which includes all pre, intra, and post visit time on the date of service. (+[id number])      Disposition  Next appointment: 5-6 months      To do @ next RTC  F/u dose-pack setup  F/u visual impairment / improvement post-op            Subjective        HPI  In addition to details in A&P above:    See above.          Past Medical History:   Diagnosis Date    Abnormal EKG 11/09/2015    Acute encephalopathy 11/30/2016    Anemia in stage 3 chronic kidney disease (CMS-HCC) 01/19/2015    Last Assessment & Plan: Formatting of this note might be different from the original. # Severe iron deficiency anemia/hemoglobin- 6/ ferritin 5 [march 2017]. Status post IV iron- significant improvement of hemoglobin. Last IV Feraheme was 08/28/2015 where he received 510 mg. #Was seen by Dr. Rennie in February 2019 with complaints of worsening significant pica/fatigue/cold intolerance.  Started    Bradycardia 11/09/2015    Chronic hepatitis C without hepatic coma    01/19/2015    CKD (chronic kidney disease) stage 3, GFR 30-59 ml/min (CMS-HCC) 01/08/2015    Coronary artery disease 10/2015    Multivessel disease, plan medical management after cath 6/17    GERD (gastroesophageal reflux disease)     Gout     HCV (hepatitis C virus) 11/22/2012    (genotype 1, HCV RNA =311,000; liver biopsy 05/2005 =chronic hepatitis grade II stage II).  Has consistently and repeatedly refused therapy.       Hepatitis B core  antibody positive 11/18/2023    HIV (human immunodeficiency virus infection)        Undectable viral load 5/17    Hypertension     Nephrolithiasis     Nephrotic syndrome 03/05/2014    Neurological deficit present 11/13/2015    Weakness in lower extremities    Personal history of gout 10/27/2018    Renal mass     Followed by neprhology, MRI 8/16 improved, felt to be benign       Social History  Background - Originally from [facility] [place] Lived in [place] when he was younger. Was a coach for long-distance and high jump, triple jump.     Housing - in [place] with wife - no pets at home - has a son and daughter, grandkids and great-grandkids - all of them in [place] ([place], [place]) -- confirmed 03/2022  School / Work & Benefits - not in school, retired (previously worked doing ISS to help troubled youth), and on Medicare -- confirmed 03/2022    Tobacco - 15PY history of cigarettes, quit 1983 -- confirmed 03/2022  Alcohol - never drinks alcohol -- confirmed 03/2022  Substance use - previous IDU (VERY distant) -- confirmed 03/2022      Medications and Allergies  He has a current medication list which includes the following prescription(s): atropine, carvedilol, docusate sodium, erythromycin, pregabalin, tamsulosin, accu-chek guide glucose meter, accu-chek guide l1-l2 ctrl sol, albuterol, allopurinol, amlodipine, aspirin, atenolol, atorvastatin, onetouch verio test strips, calcium carbonate-vitamin d2, cholecalciferol (vitamin d3 25 mcg (1,000 units)), clonidine hcl, diazepam, dicyclomine, tivicay, pifeltro, dorzolamide-timolol, ferrous sulfate, folic acid, glimepiride, hydralazine, hydrocodone-acetaminophen, lisinopril, naloxone, nitroglycerin, nystatin, ofloxacin, omega-3 fatty acids-fish oil, ondansetron, prednisolone acetate, rocklatan, ozempic, sertraline, timolol, and torsemide.    Allergies: Colchicine analogues and Tramadol      Family History  His family history includes Cancer in his father and mother; Diabetes in his brother; Heart disease (age of onset: 22) in his sister; Hypertension in his mother and sister; Kidney disease in his father; Kidney failure (age of onset: 16) in his sister; Stroke in his brother; Thyroid disease in his mother.               Objective      There were no vitals taken for this visit.      NOTE: This is a telehealth visit. PEx elements documenting skin, genital, perianal, palpation, or auscultation findings are from the most recent prior face-to-face visit, not this telehealth visit.      Const WDWN, NAD, non-toxic appearance    Eyes lids normal bilaterally, conjunctiva anicteric and noninjected OU   PERRL    ENMT normal appearance of external nose and ears, no nasal discharge   OP clear - edentulous - dentures in upper and lower   Neck neck of normal appearance and trachea midline   no thyromegaly, nodules, or tenderness    Lymph no LAD in neck    CV RRR, no r/g, S1/S2 - difficult auscultation (distant)  no peripheral edema, WWP    Resp normal WOB   on RA, no breathlessness with speaking, no coughing, CTAB    GI normal inspection, NTND, NABS - protuberant  no umbilical hernia on exam    GU deferred   MSK no clubbing or cyanosis of hands   no focal tenderness or abnormalities of joints of RUE, LUE, RLE, or LLE    Skin no rashes, lesions, or ulcers of visualized skin   no nodules or areas of induration of  palpated skin    Neuro CNs II-XII grossly intact   sensation to light touch grossly intact throughout    Psych appropriate affect   oriented to person, place, time

## 2023-11-22 NOTE — Unmapped (Signed)
 [facility]  [address]  [place], [place] [address]  PHONE: [phone]  FAX: [phone]     CLINICAL PHARMACIST PRACTITIONER NOTE   INFECTIOUS DISEASES    Referring Infectious Diseases Specialist: Hurt, Lonni Schlossman, MD  Primary Care Provider: [name], MD    Reason for Call: HIV Medication Therapy Management (MTM) and Adverse Effect Management/Reduction    History of Present Illness: Samuel [name] is a 73 y.o. year old male with a past medical history of HIV, HTN, CKD, T2DM who presents today for medication reconciliation and discussion of medication dose packs.    Allergies: Allergies[1]    PMH/PSH: Past Medical History[2]      HIV Care:  HIV Viral Load:   Lab Results   Component Value Date    HIVRS Detected (A) 05/04/2023    HIVRS Detected (A) 08/06/2022    HIVRS Detected (A) 03/12/2022    HIVRS Detected (A) 09/23/2021    HIVCP <20 (H) 05/04/2023    HIVCP <20 (H) 08/06/2022    HIVCP <20 (H) 03/12/2022    HIVCP <20 (H) 09/23/2021    HIV10  05/04/2023      Comment:      <1.30 log    HIV10  08/06/2022      Comment:      <1.30 log    HIV10  03/12/2022      Comment:      <1.30 log    HIV10  09/23/2021      Comment:      <1.30 log      CD4 Count:   Lab Results   Component Value Date    ACD4 782 05/04/2023    ACD4 770 08/06/2022    ACD4 612 09/23/2021    ACD4 720 02/26/2021     CD4%:   Lab Results   Component Value Date    CD4 34 05/04/2023    CD4 35 08/06/2022    CD4 34 09/23/2021    CD4 36 02/26/2021     Hepatitis C:   Lab Results   Component Value Date    HEPCAB Reactive (A) 01/29/2020      Hepatitis B:   Lab Results   Component Value Date    HBSAG Nonreactive 01/31/2020    HBSAG Nonreactive 01/29/2020    HBSAG Nonreactive 03/11/2016        Current ART:   CURRENT OUTPATIENT ADMINISTERED ANTIRETROVIRAL MEDICATIONS   Medication Sig Dispense Refill    dolutegravir  (TIVICAY ) 50 mg TABLET Take 1 tablet (50 mg total) by mouth daily. 90 tablet 3    doravirine  (PIFELTRO ) 100 mg Tab Take 1 tablet (100 mg total) by mouth daily.  30 tablet 11       Current Medications: Current Medications[3]      Assessment and Plan:   Vital Signs:  BP Readings from Last 3 Encounters:   09/07/23 160/92   09/03/23 153/97   05/20/23 150/93     Pulse Readings from Last 3 Encounters:   09/07/23 63   09/03/23 64   05/20/23 65     Wt Readings from Last 3 Encounters:   09/07/23 98.9 kg (218 lb)   09/03/23 100.4 kg (221 lb 6.4 oz)   05/20/23 (!) 102.2 kg (225 lb 6.4 oz)      BMI Readings from Last 3 Encounters:   09/07/23 30.42 kg/m??   09/03/23 30.89 kg/m??   05/20/23 31.44 kg/m??        #Medication Adherence and Access:  Patient and spouse interested in starting adherence packaging at [facility] in [place] for non-specialty medications. Expressed desire to continue receiving ARVs through [facility].  There is confusion regarding what medications are currently prescribed and what the patient has at home and reports taking (ex. Taking both atenolol  and carvedilol , HIV regimen of Tivicay /Pifeltro  vs Biktarvy , taking atenolol  100 mg and 40 mg daily, having amlodipine  5 mg and 10 mg being unsure which is being taken daily).  Patient's spouse said she would get dispense record from [name] to help clarify discrepancies, but an in-person visit with the [name] is strongly encouraged to address these issues prior to starting adherence packaging.   Plan to follow up with patient and his spouse to discuss medication reconciliation and address adherence.     Follow-up:  Next [name] visit TBD  Next ID visit 12/4 with Dr. Jewel    I spent a total of 30 minutes on the phone with the patient delivering clinical care and providing education/counseling.     Lang Eagles, PharmD Candidate  [facility]  [address]  [place], [place] [address]        [1]   Allergies  Allergen Reactions    Colchicine Analogues Diarrhea     Have diarrhea when taken for long periods of time Tramadol Other (See Comments)     unknown tolerates morphine   [2]   Past Medical History:  Diagnosis Date    Abnormal EKG 11/09/2015    Acute encephalopathy 11/30/2016    Anemia in stage 3 chronic kidney disease (CMS-HCC) 01/19/2015    Last Assessment & Plan: Formatting of this note might be different from the original. # Severe iron deficiency anemia/hemoglobin- 6/ ferritin 5 [march 2017]. Status post IV iron- significant improvement of hemoglobin. Last IV Feraheme was 08/28/2015 where he received 510 mg. #Was seen by Dr. Rennie in February 2019 with complaints of worsening significant pica/fatigue/cold intolerance.  Started    Bradycardia 11/09/2015    Chronic hepatitis C without hepatic coma    01/19/2015    CKD (chronic kidney disease) stage 3, GFR 30-59 ml/min (CMS-HCC) 01/08/2015    Coronary artery disease 10/2015    Multivessel disease, plan medical management after cath 6/17    GERD (gastroesophageal reflux disease)     Gout     HCV (hepatitis C virus) 11/22/2012    (genotype 1, HCV RNA =311,000; liver biopsy 05/2005 =chronic hepatitis grade II stage II).  Has consistently and repeatedly refused therapy.       Hepatitis B core antibody positive 11/18/2023    HIV (human immunodeficiency virus infection)        Undectable viral load 5/17    Hypertension     Nephrolithiasis     Nephrotic syndrome 03/05/2014    Neurological deficit present 11/13/2015    Weakness in lower extremities    Personal history of gout 10/27/2018    Renal mass     Followed by neprhology, MRI 8/16 improved, felt to be benign   [3]   Current Outpatient Medications:     ACCU-CHEK GUIDE GLUCOSE METER Misc, FOLLOW PACKAGE DIRECTIONS, Disp: , Rfl:     ACCU-CHEK GUIDE L1-L2 CTRL SOL Soln, , Disp: , Rfl:     albuterol (PROVENTIL HFA;VENTOLIN HFA) 90 mcg/actuation inhaler, Inhale 2 puffs. Take as needed, Disp: , Rfl:     allopurinoL  (ZYLOPRIM ) 100 MG tablet, Take 2.5 tablets (250 mg total) by mouth daily., Disp: , Rfl:  amlodipine  (NORVASC ) 10 MG tablet, Take 1 tablet (10 mg total) by mouth daily., Disp: 90 tablet, Rfl: 3    aspirin (ECOTRIN) 81 MG tablet, daily. , Disp: , Rfl: 0    atenolol  (TENORMIN ) 100 MG tablet, Take 1 tablet (100 mg total) by mouth daily., Disp: 100 tablet, Rfl: 3    atorvastatin  (LIPITOR) 40 MG tablet, Take 1 tablet (40 mg total) by mouth daily., Disp: , Rfl:     atropine 1 % ophthalmic solution, INSTILL 1 DROP IN THE RIGHT EYE FOUR TIMES DAILY, Disp: , Rfl:     blood sugar diagnostic (ONETOUCH VERIO TEST STRIPS) Strp, Use 2 (two) times daily E11.22, Disp: , Rfl:     calcium  carbonate-vitamin D2 500 mg(1,250mg ) -200 unit tablet, Take 1 tablet (500 mg total) by mouth two (2) times a day., Disp: , Rfl:     carvedilol  (COREG ) 12.5 MG tablet, Take 1 tablet (12.5 mg total) by mouth., Disp: , Rfl:     cholecalciferol , vitamin D3 25 mcg, 1,000 units,, 1,000 unit (25 mcg) tablet, Take by mouth daily., Disp: , Rfl:     cloNIDine  HCL (CATAPRES ) 0.1 MG tablet, Take 1 tablet (0.1 mg total) by mouth nightly., Disp: , Rfl:     diazepam  (VALIUM ) 5 MG tablet, Take 1 tablet (5 mg total) by mouth nightly., Disp: , Rfl:     dicyclomine (BENTYL) 20 mg tablet, , Disp: , Rfl:     docusate sodium (COLACE) 100 MG capsule, Take 1 capsule (100 mg total) by mouth every hour as needed., Disp: , Rfl:     dolutegravir  (TIVICAY ) 50 mg TABLET, Take 1 tablet (50 mg total) by mouth daily., Disp: 90 tablet, Rfl: 3    doravirine  (PIFELTRO ) 100 mg Tab, Take 1 tablet (100 mg total) by mouth daily., Disp: 30 tablet, Rfl: 11    dorzolamide -timoloL  (COSOPT ) 22.3-6.8 mg/mL ophthalmic solution, Administer 1 drop to both eyes two (2) times a day., Disp: , Rfl:     erythromycin (ROMYCIN) 5 mg/gram (0.5 %) ophthalmic ointment, APPLY THIN LAYER IN RIGHT EYE AT BEDTIME, Disp: , Rfl:     ferrous sulfate 325 (65 FE) MG tablet, Take 1 tablet (325 mg total) by mouth in the morning., Disp: , Rfl:     folic acid (FOLVITE) 1 MG tablet, , Disp: , Rfl: 0    glimepiride  (AMARYL ) 4 MG tablet, Take 1 tablet (4 mg total) by mouth in the morning., Disp: 90 tablet, Rfl: 3    hydrALAZINE  (APRESOLINE ) 100 MG tablet, Take 1 tablet (100 mg total) by mouth two (2) times a day., Disp: , Rfl:     HYDROcodone-acetaminophen (NORCO) 5-325 mg per tablet, Directions are take one tablet 30 minutes before physical therapy, then take one tablet at bedtime as needed for pain, Disp: , Rfl:     lisinopril  (PRINIVIL ,ZESTRIL ) 40 MG tablet, Take 1 tablet (40 mg total) by mouth daily., Disp: 90 tablet, Rfl: 3    naloxone  (NARCAN ) 4 mg nasal spray, Call 911. Administer a single spray of narcan  in on nostril. Repeat every 3 minutes as needed if no or minimal response. (Patient not taking: Reported on 09/03/2023), Disp: 1 each, Rfl: 99    nitroglycerin  (NITROSTAT ) 0.4 MG SL tablet, Place 1 tablet (0.4 mg total) under the tongue every five (5) minutes as needed for chest pain. Maximum of 3 doses in 15 minutes., Disp: 25 tablet, Rfl: 0    nystatin (MYCOSTATIN) 100,000 unit/mL suspension, Take 5 mL (500,000 Units total)  by mouth daily., Disp: , Rfl:     ofloxacin (OCUFLOX) 0.3 % ophthalmic solution, Administer 1 drop into the left eye four (4) times a day., Disp: , Rfl:     omega-3 fatty acids-fish oil 340-1,000 mg capsule, Take 1 capsule by mouth two (2) times a day., Disp: , Rfl:     ondansetron (ZOFRAN) 4 MG tablet, , Disp: , Rfl:     prednisoLONE acetate (PRED FORTE) 1 % ophthalmic suspension, Administer 1 drop into the left eye four (4) times a day., Disp: , Rfl:     pregabalin (LYRICA) 75 MG capsule, Take 1 capsule (75 mg total) by mouth., Disp: , Rfl:     ROCKLATAN 0.02-0.005 % Drop, INT 1 GTT INTO OU QHS, Disp: , Rfl:     semaglutide (OZEMPIC) 1 mg/dose (2 mg/1.5 mL) PnIj, Inject 1 mg under the skin every seven (7) days., Disp: , Rfl:     sertraline (ZOLOFT) 50 MG tablet, take 1 tablet by mouth once daily, Disp: , Rfl:     tamsulosin  (FLOMAX ) 0.4 mg capsule, , Disp: , Rfl:     timolol  (TIMOPTIC ) 0.5 % ophthalmic solution, INSTILL 1 DROP IN BOTH EYES EVERY MORNING, Disp: , Rfl:     torsemide  (DEMADEX ) 10 MG tablet, Take 2 tablets (20 mg total) by mouth daily., Disp: , Rfl:

## 2023-11-30 NOTE — Unmapped (Signed)
 Reached out to patient to schedule an in-person visit with [name] to conduct a comprehensive medication reconciliation and to discuss pill-pack options. [name] to meet with Mr. Vanbenschoten and his spouse on Thursday, July 10th at 10am.

## 2023-12-01 NOTE — Unmapped (Signed)
 [facility] has made a second and final attempt to reach this patient to refill the following medication:tivicay  and pifeltro .      We have been unable to leave messages on the following phone numbers: [phone], have sent a MyChart message, and have sent a text message to the following phone numbers: [phone].    Dates contacted: 6/23, 7/2  Last scheduled delivery: 11/01/2023    The patient may be at risk of non-compliance with this medication. The patient should call the [facility] at [phone]  Option 4, then Option 4: Infectious Disease, Transplant to refill medication.    Samuel [name] CHRISTELLA Dross   [facility] Specialty Technician

## 2023-12-08 NOTE — Unmapped (Signed)
 [facility] Refill Coordination Note    Specialty Medication(s) to be Shipped:   Infectious Disease: Pifeltro  and Tivicay     Other medication(s) to be shipped: No additional medications requested for fill at this time     Samuel [name], DOB: 07-15-1950  Phone: [phone] (home)       All above HIPAA information was verified with patient.     Was a Nurse, learning disability used for this call? No    Completed refill call assessment today to schedule patient's medication shipment from the [facility]  [phone]).  All relevant notes have been reviewed.     Specialty medication(s) and dose(s) confirmed: Regimen is correct and unchanged.   Changes to medications: Finnegan reports no changes at this time.  Changes to insurance: No  New side effects reported not previously addressed with a pharmacist or physician: None reported  Questions for the pharmacist: No    Confirmed patient received a Conservation officer, historic buildings and a Surveyor, mining with first shipment. The patient will receive a drug information handout for each medication shipped and additional FDA Medication Guides as required.       DISEASE/MEDICATION-SPECIFIC INFORMATION        N/A    SPECIALTY MEDICATION ADHERENCE     Medication Adherence    Patient reported X missed doses in the last month: 0  Specialty Medication: PIFELTRO  100 mg Tab (doravirine )  Patient is on additional specialty medications: Yes  Additional Specialty Medications: TIVICAY  50 mg TABLET (dolutegravir )  Patient Reported Additional Medication X Missed Doses in the Last Month: 0  Patient is on more than two specialty medications: No              Were doses missed due to medication being on hold? No    TIVICAY  50 mg TABLET (dolutegravir ) : 7 days of medicine on hand   PIFELTRO  100 mg Tab (doravirine ) : 7 days of medicine on hand     REFERRAL TO PHARMACIST     Referral to the pharmacist: Not needed      [facility]     Shipping address confirmed in Epic.     Cost and Payment: Patient has a $0 copay, payment information is not required.    Delivery Scheduled: Yes, Expected medication delivery date: 12/10/23.     Medication will be delivered via Same Day Courier to the prescription address in Epic WAM.    [name]   [place] [facility]  Specialty Technician

## 2023-12-10 MED FILL — TIVICAY 50 MG TABLET: ORAL | 30 days supply | Qty: 30 | Fill #10

## 2023-12-10 MED FILL — PIFELTRO 100 MG TABLET: ORAL | 30 days supply | Qty: 30 | Fill #9

## 2023-12-13 NOTE — Unmapped (Signed)
 [facility] INFECTIOUS DISEASES CLINIC  [address]  [place], [place] [address]  PHONE: [phone]  FAX: [phone]     CLINICAL PHARMACIST PRACTITIONER NOTE   INFECTIOUS DISEASES    Referring Infectious Diseases Specialist: Hurt, Lonni Schlossman, MD  Primary Care Provider: [name], MD    Reason for Office Visit: HIV Medication Therapy Management (MTM)    History of Present Illness: Samuel [name] is a 73 y.o. year old male living with HIV and multiple comorbidities who presents today for a MTM visit with the [name] and discuss various options for pill-pack pharmacies.    Allergies: Allergies[1]    PMH/PSH: Past Medical History[2]    HIV Care:  HIV Viral Load:   Lab Results   Component Value Date    HIVRS Detected (A) 05/04/2023    HIVRS Detected (A) 08/06/2022    HIVRS Detected (A) 03/12/2022    HIVRS Detected (A) 09/23/2021    HIVCP <20 (H) 05/04/2023    HIVCP <20 (H) 08/06/2022    HIVCP <20 (H) 03/12/2022    HIVCP <20 (H) 09/23/2021    HIV10  05/04/2023      Comment:      <1.30 log    HIV10  08/06/2022      Comment:      <1.30 log    HIV10  03/12/2022      Comment:      <1.30 log    HIV10  09/23/2021      Comment:      <1.30 log      CD4 Count:   Lab Results   Component Value Date    ACD4 782 05/04/2023    ACD4 770 08/06/2022    ACD4 612 09/23/2021    ACD4 720 02/26/2021     CD4%:   Lab Results   Component Value Date    CD4 34 05/04/2023    CD4 35 08/06/2022    CD4 34 09/23/2021    CD4 36 02/26/2021     Hepatitis C:   Lab Results   Component Value Date    HEPCAB Reactive (A) 01/29/2020      Hepatitis B:   Lab Results   Component Value Date    HBSAG Nonreactive 01/31/2020    HBSAG Nonreactive 01/29/2020    HBSAG Nonreactive 03/11/2016        Current ART:   CURRENT OUTPATIENT ADMINISTERED ANTIRETROVIRAL MEDICATIONS   Medication Sig Dispense Refill    dolutegravir  (TIVICAY ) 50 mg TABLET Take 1 tablet (50 mg total) by mouth daily. 90 tablet 3    doravirine  (PIFELTRO ) 100 mg Tab Take 1 tablet (100 mg total) by mouth daily.  30 tablet 11       Current Non-ART Medications: Current Medications[3]      Assessment and Plan:   #MTM:   Patient and his spouse presented to the appointment for a medication management/reconciliation visit. I proceeded to take inventory of his medications and document in the [facility]. I reviewed the medications that the patient bought with him to his appointment and updated the list in EPIC accordingly. For medications that the patient did not bring to the clinic visit, I noted that below as well.   Allopurinol  100 mg tablet  Take two and one-half tablets (250 mg) by mouth daily   Amlodipine  10 mg tablet  Take one tablet by mouth daily  Patient didn't bring physical bottle as he forgot it on the counter at home. He did verify name, dose, indication, and  route   Aspirin 81 mg tablet  Take one tablet by mouth daily  Patient didn't bring physical bottle as he forgot it at home  Atorvastatin  40 mg tablet  Take one tablet by mouth daily  Carvedilol  12.5 mg tablet  Take one tablet by mouth twice daily  Cholecalciferol  (vitamin D3 25 mcg)  Patient reports taking one tablet by mouth weekly  Clonidine  0.1 mg tablet  Take one tablet by mouth nightly  Dicyclomine 20 mg tablet  Take one tablet by mouth twice daily   Dolutegravir  50 mg tablet  Take one tablet by mouth daily  Doravirine  100 mg tablet  Take one tablet by mouth daily  Glimepiride  4 mg tablet  Take one tablet by mouth daily with breakfast  Hydralazine  100 mg tablet  Take one tablet by mouth twice daily  Patient didn't bring physical bottle, and was unsure if he is taking this medication or not   This medication doesn't appear on his dispense history   Lisinopril  40 mg tablet   Take one tablet by mouth daily  Nitroglycerin  0.4 mg SL tablet  Place one tablet under the tongue every five minutes as needed for chest pain. Max 3 doses in fifteen minutes  Ozempic (Semaglutide) 1mg   Inject 1 mg under the skin every seven days  Sertraline 50 mg tablet  Take one tablet by mouth daily.   Patient stated that he has difficulty sleeping at night. I suggested taking the sertraline during the day to see if this helps  Tamsulosin  0.4 mg capsule  Take one capsule by mouth at daily  Patient reports taking this at bedtime  Torsemide  10 mg tablet  Take two tablets by mouth daily    Updated medication list in EPIC and indicated which medications the patient is no longer taking.   Patient stated having the following concerns:  Patient requests a new glucose monitor as the one he has no longer works  Patient is also requesting a blood pressure monitor with a larger cuff. Informed him that I will take a look into it.   Patient is taking a One-a-Day multivitamin and is also taking a separate vitamin B-12 (500 mcg) once daily. Patient would like to know if he needs the additional B-12 supplementation or is the MVT alone sufficient.     Pill-Pack/Blister-pack pharmacies   [facility] in [place]   The pharmacy offers this service as a courtesy to patients and charges $4 per pack per month. The overall cost depends on how many packs are dispensed. The overall cost for Samuel [name] is estimated to be around $8 to $16 monthly. The pharmacy is able to deliver medications as well. Since this pharmacy is in [place], the patient's spouse will most likely pick up the medications directly.   [facility] in [place] that offers blister packs to patients for no additional cost. Pharmacy is also able to deliver.  [name] in [place]  Patients are eligible to enroll in the [employer] Compliance Program for no additional cost to the patient.   Pharmacy can also deliver to patient for no additional costs.  PLAN:  Share updated medication list with the patient's providers to ensure that the list is accurate and nothing is missing.   Once list has been confirmed by the providers, [name] will initiate transfer of prescriptions to patient's pharmacy of choice and ensure blister packaging.   [name] will follow up with patient afterwards to see how patient is doing  with the blister packs.     Importance of informing pharmacy and clinic of updated contact information. Stressed importance of being able to reach over the phone to set up refills. Advised patient to call when down to about 14 day supply left to ensure there's no interruption in therapy.    Patient verbalized understanding of counseling. Provided contact information for any further questions/concerns.       #Medication Adherence and Access:  Since last visit, patient denies missing doses of any of his medications.  Is the pharmacy on file the one you are currently using to fill your medication(s) through [facility] pharmacy with patient)? [id number]  [facility] for [place]  and Pifeltro   Do you ever forget to take your medications? Unsure; He thinks he may have missed some of his medications.   Do any of your medications make you sick? No  Are you experiencing any barriers to care like: transportation to medical appointments, stable housing, lack of food, etc.? Yes; Visual impairment. Spouse drives him to visits.    Follow-up:  Next [name] follow up 12/22/2023  Next ID visit 05/04/2024 with Hurt, Lonni Schlossman, MD    I spent a total of 60 minutes face to face with the patient delivering clinical care and providing education/counseling.       Camille Evans, PharmD, BCIDP, [name], AAHIVP  Clinical Pharmacist Practitioner - HIV/Infectious Diseases    [facility] INFECTIOUS DISEASES CLINIC  [address]  [place], [place]  [address]  P [phone]  F [phone]        [1]   Allergies  Allergen Reactions    Colchicine Analogues Diarrhea     Have diarrhea when taken for long periods of time    Tramadol Other (See Comments)     unknown tolerates morphine   [2]   Past Medical History:  Diagnosis Date    Abnormal EKG 11/09/2015    Acute encephalopathy 11/30/2016    Anemia in stage 3 chronic kidney disease (CMS-HCC) 01/19/2015    Last Assessment & Plan: Formatting of this note might be different from the original. # Severe iron deficiency anemia/hemoglobin- 6/ ferritin 5 [march 2017]. Status post IV iron- significant improvement of hemoglobin. Last IV Feraheme was 08/28/2015 where he received 510 mg. #Was seen by Dr. Rennie in February 2019 with complaints of worsening significant pica/fatigue/cold intolerance.  Started    Bradycardia 11/09/2015    Chronic hepatitis C without hepatic coma    01/19/2015    CKD (chronic kidney disease) stage 3, GFR 30-59 ml/min (CMS-HCC) 01/08/2015    Coronary artery disease 10/2015    Multivessel disease, plan medical management after cath 6/17    GERD (gastroesophageal reflux disease)     Gout     HCV (hepatitis C virus) 11/22/2012    (genotype 1, HCV RNA =311,000; liver biopsy 05/2005 =chronic hepatitis grade II stage II).  Has consistently and repeatedly refused therapy.       Hepatitis B core antibody positive 11/18/2023    HIV (human immunodeficiency virus infection)        Undectable viral load 5/17    Hypertension     Nephrolithiasis     Nephrotic syndrome 03/05/2014    Neurological deficit present 11/13/2015    Weakness in lower extremities    Personal history of gout 10/27/2018    Renal mass     Followed by neprhology, MRI 8/16 improved, felt to be benign   [3]   Current Outpatient  Medications:     ACCU-CHEK GUIDE GLUCOSE METER Misc, FOLLOW PACKAGE DIRECTIONS, Disp: , Rfl:     ACCU-CHEK GUIDE L1-L2 CTRL SOL Soln, , Disp: , Rfl:     allopurinoL  (ZYLOPRIM ) 100 MG tablet, Take 2.5 tablets (250 mg total) by mouth daily., Disp: , Rfl:     amlodipine  (NORVASC ) 10 MG tablet, Take 1 tablet (10 mg total) by mouth daily., Disp: 90 tablet, Rfl: 3    aspirin (ECOTRIN) 81 MG tablet, daily. , Disp: , Rfl: 0    atorvastatin  (LIPITOR) 40 MG tablet, Take 1 tablet (40 mg total) by mouth daily., Disp: , Rfl:     blood sugar diagnostic (ONETOUCH VERIO TEST STRIPS) Strp, Use 2 (two) times daily E11.22, Disp: , Rfl:     carvedilol  (COREG ) 12.5 MG tablet, Take 1 tablet (12.5 mg total) by mouth two (2) times a day., Disp: , Rfl:     cholecalciferol , vitamin D3 25 mcg, 1,000 units,, 1,000 unit (25 mcg) tablet, Take by mouth daily. (Patient taking differently: Take 1 tablet (25 mcg total) by mouth once a week.), Disp: , Rfl:     cloNIDine  HCL (CATAPRES ) 0.1 MG tablet, Take 1 tablet (0.1 mg total) by mouth nightly., Disp: , Rfl:     dicyclomine (BENTYL) 20 mg tablet, Take 1 tablet (20 mg total) by mouth two (2) times a day., Disp: , Rfl:     dolutegravir  (TIVICAY ) 50 mg TABLET, Take 1 tablet (50 mg total) by mouth daily., Disp: 90 tablet, Rfl: 3    doravirine  (PIFELTRO ) 100 mg Tab, Take 1 tablet (100 mg total) by mouth daily., Disp: 30 tablet, Rfl: 11    dorzolamide -timoloL  (COSOPT ) 22.3-6.8 mg/mL ophthalmic solution, Administer 1 drop to both eyes two (2) times a day., Disp: , Rfl:     erythromycin (ROMYCIN) 5 mg/gram (0.5 %) ophthalmic ointment, APPLY THIN LAYER IN RIGHT EYE AT BEDTIME, Disp: , Rfl:     glimepiride  (AMARYL ) 4 MG tablet, Take 1 tablet (4 mg total) by mouth in the morning., Disp: 90 tablet, Rfl: 3    lisinopril  (PRINIVIL ,ZESTRIL ) 40 MG tablet, Take 1 tablet (40 mg total) by mouth daily., Disp: 90 tablet, Rfl: 3    nitroglycerin  (NITROSTAT ) 0.4 MG SL tablet, Place 1 tablet (0.4 mg total) under the tongue every five (5) minutes as needed for chest pain. Maximum of 3 doses in 15 minutes., Disp: 25 tablet, Rfl: 0    pregabalin (LYRICA) 75 MG capsule, Take 1 capsule (75 mg total) by mouth two (2) times a day., Disp: , Rfl:     semaglutide (OZEMPIC) 1 mg/dose (2 mg/1.5 mL) PnIj, Inject 1 mg under the skin every seven (7) days., Disp: , Rfl:     sertraline (ZOLOFT) 50 MG tablet, take 1 tablet by mouth once daily, Disp: , Rfl:     tamsulosin  (FLOMAX ) 0.4 mg capsule, , Disp: , Rfl:     torsemide  (DEMADEX ) 10 MG tablet, Take 2 tablets (20 mg total) by mouth daily., Disp: , Rfl:     albuterol (PROVENTIL HFA;VENTOLIN HFA) 90 mcg/actuation inhaler, Inhale 2 puffs. Take as needed (Patient not taking: Reported on 12/13/2023), Disp: , Rfl:     atenolol  (TENORMIN ) 100 MG tablet, Take 1 tablet (100 mg total) by mouth daily. (Patient not taking: Reported on 12/13/2023), Disp: 100 tablet, Rfl: 3    atropine 1 % ophthalmic solution, INSTILL 1 DROP IN THE RIGHT EYE FOUR TIMES DAILY, Disp: , Rfl:     calcium  carbonate-vitamin D2  500 mg(1,250mg ) -200 unit tablet, Take 1 tablet (500 mg total) by mouth two (2) times a day. (Patient not taking: Reported on 12/13/2023), Disp: , Rfl:     diazepam  (VALIUM ) 5 MG tablet, Take 1 tablet (5 mg total) by mouth nightly. (Patient not taking: Reported on 12/13/2023), Disp: , Rfl:     docusate sodium (COLACE) 100 MG capsule, Take 1 capsule (100 mg total) by mouth every hour as needed. (Patient not taking: Reported on 12/13/2023), Disp: , Rfl:     ferrous sulfate 325 (65 FE) MG tablet, Take 1 tablet (325 mg total) by mouth in the morning. (Patient not taking: Reported on 12/13/2023), Disp: , Rfl:     folic acid (FOLVITE) 1 MG tablet, , Disp: , Rfl: 0    hydrALAZINE  (APRESOLINE ) 100 MG tablet, Take 1 tablet (100 mg total) by mouth two (2) times a day., Disp: , Rfl:     HYDROcodone-acetaminophen (NORCO) 5-325 mg per tablet, Directions are take one tablet 30 minutes before physical therapy, then take one tablet at bedtime as needed for pain (Patient not taking: Reported on 12/13/2023), Disp: , Rfl:     naloxone  (NARCAN ) 4 mg nasal spray, Call 911. Administer a single spray of narcan  in on nostril. Repeat every 3 minutes as needed if no or minimal response. (Patient not taking: Reported on 12/13/2023), Disp: 1 each, Rfl: 99    nystatin (MYCOSTATIN) 100,000 unit/mL suspension, Take 5 mL (500,000 Units total) by mouth daily. (Patient not taking: Reported on 12/13/2023), Disp: , Rfl:     ofloxacin (OCUFLOX) 0.3 % ophthalmic solution, Administer 1 drop into the left eye four (4) times a day., Disp: , Rfl: omega-3 fatty acids-fish oil 340-1,000 mg capsule, Take 1 capsule by mouth two (2) times a day. (Patient not taking: Reported on 12/13/2023), Disp: , Rfl:     ondansetron (ZOFRAN) 4 MG tablet, , Disp: , Rfl:     prednisoLONE acetate (PRED FORTE) 1 % ophthalmic suspension, Administer 1 drop into the left eye four (4) times a day., Disp: , Rfl:     ROCKLATAN 0.02-0.005 % Drop, INT 1 GTT INTO OU QHS, Disp: , Rfl:     timolol  (TIMOPTIC ) 0.5 % ophthalmic solution, INSTILL 1 DROP IN BOTH EYES EVERY MORNING, Disp: , Rfl:

## 2023-12-28 NOTE — Unmapped (Signed)
 [facility] and Home Delivery Pharmacy Refill Coordination Note    Specialty Medication(s) to be Shipped:   Infectious Disease: Pifeltro  and Tivicay     Other medication(s) to be shipped: No additional medications requested for fill at this time     Samuel [name], DOB: 05/29/1951  Phone: [phone] (home)       All above HIPAA information was verified with patient.     Was a Nurse, learning disability used for this call? No    Completed refill call assessment today to schedule patient's medication shipment from the [facility]  [phone]).  All relevant notes have been reviewed.     Specialty medication(s) and dose(s) confirmed: Regimen is correct and unchanged.   Changes to medications: Arlen reports no changes at this time.  Changes to insurance: No  New side effects reported not previously addressed with a pharmacist or physician: None reported  Questions for the pharmacist: No    Confirmed patient received a Conservation officer, historic buildings and a Surveyor, mining with first shipment. The patient will receive a drug information handout for each medication shipped and additional FDA Medication Guides as required.       DISEASE/MEDICATION-SPECIFIC INFORMATION        N/A    SPECIALTY MEDICATION ADHERENCE     Medication Adherence    Patient reported X missed doses in the last month: 0  Specialty Medication: dolutegravir : TIVICAY  50 mg TABLET  Patient is on additional specialty medications: Yes  Additional Specialty Medications: doravirine : PIFELTRO  100 mg Tab  Patient Reported Additional Medication X Missed Doses in the Last Month: 0  Patient is on more than two specialty medications: No              Were doses missed due to medication being on hold? No     dolutegravir : TIVICAY  50 mg TABLET: 7 days of medicine on hand    doravirine : PIFELTRO  100 mg Tab: 7 days of medicine on hand       REFERRAL TO PHARMACIST     Referral to the pharmacist: Not needed      [facility]     Shipping address confirmed in Epic.     Cost and Payment: Patient has a $0 copay, payment information is not required.    Delivery Scheduled: Yes, Expected medication delivery date: 01/04/2024.     Medication will be delivered via Next Day Courier to the prescription address in Epic WAM.    Lucie CHRISTELLA Forts   [facility] Delivery [facility]  Specialty Technician

## 2024-01-03 MED FILL — PIFELTRO 100 MG TABLET: ORAL | 30 days supply | Qty: 30 | Fill #10

## 2024-01-03 MED FILL — TIVICAY 50 MG TABLET: ORAL | 30 days supply | Qty: 30 | Fill #11

## 2024-01-05 NOTE — Unmapped (Signed)
 [facility] INFECTI[facility] DISEAS[facility] CLINIC  [address]  [place], [place] [address]  PHONE: [phone]  FAX: [phone]     CLINICAL PHARMACIST PRACTITIONER NOTE   INFECTIOUS DISEASES    Referring Infectious Diseases Specialist: Hurt, Lonni Schlossman, MD  Primary Care Provider: [name], MD    Reason for Office Visit: HIV Medication Therapy Management (MTM)    History of Present Illness: Samuel [name] is a 73 y.o. year old male living with HIV and multiple comorbidities who presents today for a MTM visit with the [name] and discuss various options for pill-pack pharmacies.    Allergies: Allergies[1]    PMH/PSH: Past Medical History[2]    HIV Care:  HIV Viral Load:   Lab Results   Component Value Date    HIVRS Detected (A) 05/04/2023    HIVRS Detected (A) 08/06/2022    HIVRS Detected (A) 03/12/2022    HIVRS Detected (A) 09/23/2021    HIVCP <20 (H) 05/04/2023    HIVCP <20 (H) 08/06/2022    HIVCP <20 (H) 03/12/2022    HIVCP <20 (H) 09/23/2021    HIV10  05/04/2023      Comment:      <1.30 log    HIV10  08/06/2022      Comment:      <1.30 log    HIV10  03/12/2022      Comment:      <1.30 log    HIV10  09/23/2021      Comment:      <1.30 log      CD4 Count:   Lab Results   Component Value Date    ACD4 782 05/04/2023    ACD4 770 08/06/2022    ACD4 612 09/23/2021    ACD4 720 02/26/2021     CD4%:   Lab Results   Component Value Date    CD4 34 05/04/2023    CD4 35 08/06/2022    CD4 34 09/23/2021    CD4 36 02/26/2021     Hepatitis C:   Lab Results   Component Value Date    HEPCAB Reactive (A) 01/29/2020      Hepatitis B:   Lab Results   Component Value Date    HBSAG Nonreactive 01/31/2020    HBSAG Nonreactive 01/29/2020    HBSAG Nonreactive 03/11/2016        Current ART:   CURRENT OUTPATIENT ADMINISTERED ANTIRETROVIRAL MEDICATIONS   Medication Sig Dispense Refill    dolutegravir  (TIVICAY ) 50 mg TABLET Take 1 tablet (50 mg total) by mouth daily. 90 tablet 3    doravirine  (PIFELTRO ) 100 mg Tab Take 1 tablet (100 mg total) by mouth daily.  30 tablet 11       Current Non-ART Medications: Current Medications[3]      Assessment and Plan:   #MTM:     01/05/2024  Followed up with patient and his spouse today regarding transferring his medications to a pill-pack pharmacy. I informed them that I am still looking to hear back from his PCP to confirm that the medication list is accurate prior to transfer. Patient has a follow up appointment with his PCP on 8/25 and will remind them to follow up with me. I will reach out to PCP again.     12/13/2023  Patient and his spouse presented to the appointment for a medication management/reconciliation visit. I proceeded to take inventory of his medications and document in the [facility]. I reviewed the medications that the patient bought with him to his  appointment and updated the list in EPIC accordingly. For medications that the patient did not bring to the clinic visit, I noted that below as well.   Allopurinol  100 mg tablet  Take two and one-half tablets (250 mg) by mouth daily   Amlodipine  10 mg tablet  Take one tablet by mouth daily  Patient didn't bring physical bottle as he forgot it on the counter at home. He did verify name, dose, indication, and route   Aspirin 81 mg tablet  Take one tablet by mouth daily  Patient didn't bring physical bottle as he forgot it at home  Atorvastatin  40 mg tablet  Take one tablet by mouth daily  Carvedilol  12.5 mg tablet  Take one tablet by mouth twice daily  Cholecalciferol  (vitamin D3 25 mcg)  Patient reports taking one tablet by mouth weekly  Clonidine  0.1 mg tablet  Take one tablet by mouth nightly  Dicyclomine 20 mg tablet  Take one tablet by mouth twice daily   Dolutegravir  50 mg tablet  Take one tablet by mouth daily  Doravirine  100 mg tablet  Take one tablet by mouth daily  Glimepiride  4 mg tablet  Take one tablet by mouth daily with breakfast  Hydralazine  100 mg tablet  Take one tablet by mouth twice daily  Patient didn't bring physical bottle, and was unsure if he is taking this medication or not   This medication doesn't appear on his dispense history   Lisinopril  40 mg tablet   Take one tablet by mouth daily  Nitroglycerin  0.4 mg SL tablet  Place one tablet under the tongue every five minutes as needed for chest pain. Max 3 doses in fifteen minutes  Ozempic (Semaglutide) 1mg   Inject 1 mg under the skin every seven days  Sertraline 50 mg tablet  Take one tablet by mouth daily.   Patient stated that he has difficulty sleeping at night. I suggested taking the sertraline during the day to see if this helps  Tamsulosin  0.4 mg capsule  Take one capsule by mouth at daily  Patient reports taking this at bedtime  Torsemide  10 mg tablet  Take two tablets by mouth daily    Updated medication list in EPIC and indicated which medications the patient is no longer taking.   Patient stated having the following concerns:  Patient requests a new glucose monitor as the one he has no longer works  Patient is also requesting a blood pressure monitor with a larger cuff. Informed him that I will take a look into it.   Patient is taking a One-a-Day multivitamin and is also taking a separate vitamin B-12 (500 mcg) once daily. Patient would like to know if he needs the additional B-12 supplementation or is the MVT alone sufficient.     Pill-Pack/Blister-pack pharmacies   [facility] in [place]   The pharmacy offers this service as a courtesy to patients and charges $4 per pack per month. The overall cost depends on how many packs are dispensed. The overall cost for Samuel [name] is estimated to be around $8 to $16 monthly. The pharmacy is able to deliver medications as well. Since this pharmacy is in [place], the patient's spouse will most likely pick up the medications directly.   [facility] in [place] that offers blister packs to patients for no additional cost. Pharmacy is also able to deliver.  [name] in [place]  Patients are eligible to enroll in the [employer] Compliance Program for no additional  cost to the patient.   Pharmacy can also deliver to patient for no additional costs.  PLAN:  Share updated medication list with the patient's providers to ensure that the list is accurate and nothing is missing.   Once list has been confirmed by the providers, [name] will initiate transfer of prescriptions to patient's pharmacy of choice and ensure blister packaging.   [name] will follow up with patient afterwards to see how patient is doing with the blister packs.     Importance of informing pharmacy and clinic of updated contact information. Stressed importance of being able to reach over the phone to set up refills. Advised patient to call when down to about 14 day supply left to ensure there's no interruption in therapy.    Patient verbalized understanding of counseling. Provided contact information for any further questions/concerns.       #Medication Adherence and Access:  Since last visit, patient denies missing doses of any of his medications.  Is the pharmacy on file the one you are currently using to fill your medication(s) through [facility] pharmacy with [facility])? [id number]  [facility] for Descov[place]  and Pifeltro   Do you ever forget to take your medications? Unsure; He thinks he may have missed some of his medications.   Do any of your medications make you sick? No  Are you experiencing any barriers to care like: transportation to medical appointments, stable housing, lack of food, etc.? Yes; Visual impairment. Spouse drives him to visits.    Follow-up:  Next [name] follow up 12/22/2023  Next ID visit 05/04/2024 with Hurt, Lonni Schlossman, MD    I spent a total of 60 minutes face to face with the patient delivering clinical care and providing education/counseling.       Camille Evans, PharmD, BCIDP, [name], AAHIVP  Clinical Pharmacist Practitioner - HIV/Infectious Diseases    [facility]  [address]  [place], [place] [address]  P [phone]  F [phone]        [1]   Allergies  Allergen Reactions    Colchicine Analogues Diarrhea     Have diarrhea when taken for long periods of time    Tramadol Other (See Comments)     unknown tolerates morphine   [2]   Past Medical History:  Diagnosis Date    Abnormal EKG 11/09/2015    Acute encephalopathy 11/30/2016    Anemia in stage 3 chronic kidney disease (CMS-HCC) 01/19/2015    Last Assessment & Plan: Formatting of this note might be different from the original. # Severe iron deficiency anemia/hemoglobin- 6/ ferritin 5 [march 2017]. Status post IV iron- significant improvement of hemoglobin. Last IV Feraheme was 08/28/2015 where he received 510 mg. #Was seen by Dr. Rennie in February 2019 with complaints of worsening significant pica/fatigue/cold intolerance.  Started    Bradycardia 11/09/2015    Chronic hepatitis C without hepatic coma    01/19/2015    CKD (chronic kidney disease) stage 3, GFR 30-59 ml/min (CMS-HCC) 01/08/2015    Coronary artery disease 10/2015    Multivessel disease, plan medical management after cath 6/17    GERD (gastroesophageal reflux disease)     Gout     HCV (hepatitis C virus) 11/22/2012    (genotype 1, HCV RNA =311,000; liver biopsy 05/2005 =chronic hepatitis grade II stage II).  Has consistently and repeatedly refused therapy.       Hepatitis B core antibody positive 11/18/2023    HIV (human immunodeficiency  virus infection)        Undectable viral load 5/17    Hypertension     Nephrolithiasis     Nephrotic syndrome 03/05/2014    Neurological deficit present 11/13/2015    Weakness in lower extremities    Personal history of gout 10/27/2018    Renal mass     Followed by neprhology, MRI 8/16 improved, felt to be benign   [3]   Current Outpatient Medications:     ACCU-CHEK GUIDE GLUCOSE METER Misc, FOLLOW PACKAGE DIRECTIONS, Disp: , Rfl:     ACCU-CHEK GUIDE L1-L2 CTRL SOL Soln, , Disp: , Rfl:     albuterol (PROVENTIL HFA;VENTOLIN HFA) 90 mcg/actuation inhaler, Inhale 2 puffs. Take as needed (Patient not taking: Reported on 12/13/2023), Disp: , Rfl:     allopurinoL  (ZYLOPRIM ) 100 MG tablet, Take 2.5 tablets (250 mg total) by mouth daily., Disp: , Rfl:     amlodipine  (NORVASC ) 10 MG tablet, Take 1 tablet (10 mg total) by mouth daily., Disp: 90 tablet, Rfl: 3    aspirin (ECOTRIN) 81 MG tablet, daily. , Disp: , Rfl: 0    atenolol  (TENORMIN ) 100 MG tablet, Take 1 tablet (100 mg total) by mouth daily. (Patient not taking: Reported on 12/13/2023), Disp: 100 tablet, Rfl: 3    atorvastatin  (LIPITOR) 40 MG tablet, Take 1 tablet (40 mg total) by mouth daily., Disp: , Rfl:     atropine 1 % ophthalmic solution, INSTILL 1 DROP IN THE RIGHT EYE FOUR TIMES DAILY, Disp: , Rfl:     blood sugar diagnostic (ONETOUCH VERIO TEST STRIPS) Strp, Use 2 (two) times daily E11.22, Disp: , Rfl:     calcium  carbonate-vitamin D2 500 mg(1,250mg ) -200 unit tablet, Take 1 tablet (500 mg total) by mouth two (2) times a day. (Patient not taking: Reported on 12/13/2023), Disp: , Rfl:     carvedilol  (COREG ) 12.5 MG tablet, Take 1 tablet (12.5 mg total) by mouth two (2) times a day., Disp: , Rfl:     cholecalciferol , vitamin D3 25 mcg, 1,000 units,, 1,000 unit (25 mcg) tablet, Take by mouth daily. (Patient taking differently: Take 1 tablet (25 mcg total) by mouth once a week.), Disp: , Rfl:     cloNIDine  HCL (CATAPRES ) 0.1 MG tablet, Take 1 tablet (0.1 mg total) by mouth nightly., Disp: , Rfl:     diazepam  (VALIUM ) 5 MG tablet, Take 1 tablet (5 mg total) by mouth nightly. (Patient not taking: Reported on 12/13/2023), Disp: , Rfl:     dicyclomine (BENTYL) 20 mg tablet, Take 1 tablet (20 mg total) by mouth two (2) times a day., Disp: , Rfl:     docusate sodium (COLACE) 100 MG capsule, Take 1 capsule (100 mg total) by mouth every hour as needed. (Patient not taking: Reported on 12/13/2023), Disp: , Rfl:     dolutegravir  (TIVICAY ) 50 mg TABLET, Take 1 tablet (50 mg total) by mouth daily., Disp: 90 tablet, Rfl: 3    doravirine  (PIFELTRO ) 100 mg Tab, Take 1 tablet (100 mg total) by mouth daily., Disp: 30 tablet, Rfl: 11    dorzolamide -timoloL  (COSOPT ) 22.3-6.8 mg/mL ophthalmic solution, Administer 1 drop to both eyes two (2) times a day., Disp: , Rfl:     erythromycin (ROMYCIN) 5 mg/gram (0.5 %) ophthalmic ointment, APPLY THIN LAYER IN RIGHT EYE AT BEDTIME, Disp: , Rfl:     ferrous sulfate 325 (65 FE) MG tablet, Take 1 tablet (325 mg total) by mouth in the morning. (Patient not taking: Reported  on 12/13/2023), Disp: , Rfl:     folic acid (FOLVITE) 1 MG tablet, , Disp: , Rfl: 0    glimepiride  (AMARYL ) 4 MG tablet, Take 1 tablet (4 mg total) by mouth in the morning., Disp: 90 tablet, Rfl: 3    hydrALAZINE  (APRESOLINE ) 100 MG tablet, Take 1 tablet (100 mg total) by mouth two (2) times a day., Disp: , Rfl:     HYDROcodone-acetaminophen (NORCO) 5-325 mg per tablet, Directions are take one tablet 30 minutes before physical therapy, then take one tablet at bedtime as needed for pain (Patient not taking: Reported on 12/13/2023), Disp: , Rfl:     lisinopril  (PRINIVIL ,ZESTRIL ) 40 MG tablet, Take 1 tablet (40 mg total) by mouth daily., Disp: 90 tablet, Rfl: 3    naloxone  (NARCAN ) 4 mg nasal spray, Call 911. Administer a single spray of narcan  in on nostril. Repeat every 3 minutes as needed if no or minimal response. (Patient not taking: Reported on 12/13/2023), Disp: 1 each, Rfl: 99    nitroglycerin  (NITROSTAT ) 0.4 MG SL tablet, Place 1 tablet (0.4 mg total) under the tongue every five (5) minutes as needed for chest pain. Maximum of 3 doses in 15 minutes., Disp: 25 tablet, Rfl: 0    nystatin (MYCOSTATIN) 100,000 unit/mL suspension, Take 5 mL (500,000 Units total) by mouth daily. (Patient not taking: Reported on 12/13/2023), Disp: , Rfl:     ofloxacin (OCUFLOX) 0.3 % ophthalmic solution, Administer 1 drop into the left eye four (4) times a day., Disp: , Rfl:     omega-3 fatty acids-fish oil 340-1,000 mg capsule, Take 1 capsule by mouth two (2) times a day. (Patient not taking: Reported on 12/13/2023), Disp: , Rfl:     ondansetron (ZOFRAN) 4 MG tablet, , Disp: , Rfl:     prednisoLONE acetate (PRED FORTE) 1 % ophthalmic suspension, Administer 1 drop into the left eye four (4) times a day., Disp: , Rfl:     pregabalin (LYRICA) 75 MG capsule, Take 1 capsule (75 mg total) by mouth two (2) times a day., Disp: , Rfl:     ROCKLATAN 0.02-0.005 % Drop, INT 1 GTT INTO OU QHS, Disp: , Rfl:     semaglutide (OZEMPIC) 1 mg/dose (2 mg/1.5 mL) PnIj, Inject 1 mg under the skin every seven (7) days., Disp: , Rfl:     sertraline (ZOLOFT) 50 MG tablet, take 1 tablet by mouth once daily, Disp: , Rfl:     tamsulosin  (FLOMAX ) 0.4 mg capsule, Take 1 capsule (0.4 mg total) by mouth before bedtime., Disp: , Rfl:     timolol  (TIMOPTIC ) 0.5 % ophthalmic solution, INSTILL 1 DROP IN BOTH EYES EVERY MORNING, Disp: , Rfl:     torsemide  (DEMADEX ) 10 MG tablet, Take 2 tablets (20 mg total) by mouth daily., Disp: , Rfl:

## 2024-01-06 NOTE — Unmapped (Unsigned)
 HISTORY OF PRESENT ILLNESS:      [name] is a 73 year old man with stage G4/A2 chronic kidney disease who is evaluated today for stage G4/A2 chronic kidney disease.      Unfortunately, he has developed severe visual loss left greater than right due to glaucoma.  He has been seeing Dr. Charlanne at the [facility] he was recommending cataract extraction with intraocular lens implant with trabeculectomy with postoperative steroid therapy 09/27/2023.   He recently saw Dr. Napper at the [facility] at [facility] in consultation who added lisinopril  40 mg daily.  Will have a follow-up serum potassium level assessed today.  He does not have dysgeusia, increased leg edema, chest pain, PND, or orthopnea.  He cooperates that he has had some difficulty with memory and is quite dependent on his spouse.  He does not want to pursue a formal neurologic evaluation.  He has not had a recent fall.    MEDICATIONS:    Current Outpatient Medications   Medication Sig Dispense Refill    ACCU-CHEK GUIDE GLUCOSE METER Misc FOLLOW PACKAGE DIRECTIONS      ACCU-CHEK GUIDE L1-L2 CTRL SOL Soln       albuterol (PROVENTIL HFA;VENTOLIN HFA) 90 mcg/actuation inhaler Inhale 2 puffs. Take as needed (Patient not taking: Reported on 12/13/2023)      allopurinoL  (ZYLOPRIM ) 100 MG tablet Take 2.5 tablets (250 mg total) by mouth daily.      amlodipine  (NORVASC ) 10 MG tablet Take 1 tablet (10 mg total) by mouth daily. 90 tablet 3    aspirin (ECOTRIN) 81 MG tablet daily.   0    atenolol  (TENORMIN ) 100 MG tablet Take 1 tablet (100 mg total) by mouth daily. (Patient not taking: Reported on 12/13/2023) 100 tablet 3    atorvastatin  (LIPITOR) 40 MG tablet Take 1 tablet (40 mg total) by mouth daily.      atropine 1 % ophthalmic solution INSTILL 1 DROP IN THE RIGHT EYE FOUR TIMES DAILY      blood sugar diagnostic (ONETOUCH VERIO TEST STRIPS) Strp Use 2 (two) times daily E11.22      calcium  carbonate-vitamin D2 500 mg(1,250mg ) -200 unit tablet Take 1 tablet (500 mg total) by mouth two (2) times a day. (Patient not taking: Reported on 12/13/2023)      carvedilol  (COREG ) 12.5 MG tablet Take 1 tablet (12.5 mg total) by mouth two (2) times a day.      cholecalciferol , vitamin D3 25 mcg, 1,000 units,, 1,000 unit (25 mcg) tablet Take by mouth daily. (Patient taking differently: Take 1 tablet (25 mcg total) by mouth once a week.)      cloNIDine  HCL (CATAPRES ) 0.1 MG tablet Take 1 tablet (0.1 mg total) by mouth nightly.      diazepam  (VALIUM ) 5 MG tablet Take 1 tablet (5 mg total) by mouth nightly. (Patient not taking: Reported on 12/13/2023)      dicyclomine (BENTYL) 20 mg tablet Take 1 tablet (20 mg total) by mouth two (2) times a day.      docusate sodium (COLACE) 100 MG capsule Take 1 capsule (100 mg total) by mouth every hour as needed. (Patient not taking: Reported on 12/13/2023)      dolutegravir  (TIVICAY ) 50 mg TABLET Take 1 tablet (50 mg total) by mouth daily. 90 tablet 3    doravirine  (PIFELTRO ) 100 mg Tab Take 1 tablet (100 mg total) by mouth daily. 30 tablet 11    dorzolamide -timoloL  (COSOPT ) 22.3-6.8 mg/mL ophthalmic solution Administer 1  drop to both eyes two (2) times a day.      erythromycin (ROMYCIN) 5 mg/gram (0.5 %) ophthalmic ointment APPLY THIN LAYER IN RIGHT EYE AT BEDTIME      ferrous sulfate 325 (65 FE) MG tablet Take 1 tablet (325 mg total) by mouth in the morning. (Patient not taking: Reported on 12/13/2023)      folic acid (FOLVITE) 1 MG tablet  (Patient not taking: Reported on 12/13/2023)  0    glimepiride  (AMARYL ) 4 MG tablet Take 1 tablet (4 mg total) by mouth in the morning. 90 tablet 3    hydrALAZINE  (APRESOLINE ) 100 MG tablet Take 1 tablet (100 mg total) by mouth two (2) times a day.      HYDROcodone-acetaminophen (NORCO) 5-325 mg per tablet Directions are take one tablet 30 minutes before physical therapy, then take one tablet at bedtime as needed for pain (Patient not taking: Reported on 12/13/2023)      lisinopril  (PRINIVIL ,ZESTRIL ) 40 MG tablet Take 1 tablet (40 mg total) by mouth daily. 90 tablet 3    naloxone  (NARCAN ) 4 mg nasal spray Call 911. Administer a single spray of narcan  in on nostril. Repeat every 3 minutes as needed if no or minimal response. (Patient not taking: Reported on 12/13/2023) 1 each 99    nitroglycerin  (NITROSTAT ) 0.4 MG SL tablet Place 1 tablet (0.4 mg total) under the tongue every five (5) minutes as needed for chest pain. Maximum of 3 doses in 15 minutes. 25 tablet 0    nystatin (MYCOSTATIN) 100,000 unit/mL suspension Take 5 mL (500,000 Units total) by mouth daily. (Patient not taking: Reported on 12/13/2023)      ofloxacin (OCUFLOX) 0.3 % ophthalmic solution Administer 1 drop into the left eye four (4) times a day.      omega-3 fatty acids-fish oil 340-1,000 mg capsule Take 1 capsule by mouth two (2) times a day. (Patient not taking: Reported on 12/13/2023)      ondansetron (ZOFRAN) 4 MG tablet  (Patient not taking: Reported on 12/13/2023)      prednisoLONE acetate (PRED FORTE) 1 % ophthalmic suspension Administer 1 drop into the left eye four (4) times a day.      pregabalin (LYRICA) 75 MG capsule Take 1 capsule (75 mg total) by mouth two (2) times a day.      ROCKLATAN 0.02-0.005 % Drop INT 1 GTT INTO OU QHS      semaglutide (OZEMPIC) 1 mg/dose (2 mg/1.5 mL) PnIj Inject 1 mg under the skin every seven (7) days.      sertraline (ZOLOFT) 50 MG tablet take 1 tablet by mouth once daily      tamsulosin  (FLOMAX ) 0.4 mg capsule Take 1 capsule (0.4 mg total) by mouth before bedtime.      timolol  (TIMOPTIC ) 0.5 % ophthalmic solution INSTILL 1 DROP IN BOTH EYES EVERY MORNING      torsemide  (DEMADEX ) 10 MG tablet Take 2 tablets (20 mg total) by mouth daily.       No current facility-administered medications for this visit.           REVIEW OF SYSTEMS:  Constitutional: No constitutional symptoms.  No dysgeusia.  Cardiovascular: No chest pain.  Pulmonary: No cough or hemoptysis.  All other systems are reviewed and are negative except that noted in the history of present illness.    PHYSICAL EXAMINATION:      Constitutional: He is in no acute distress.  Vital signs:There were no vitals taken for this visit.  PTHomeBP   Wt Readings from Last 3 Encounters:   09/07/23 98.9 kg (218 lb)   09/03/23 100.4 kg (221 lb 6.4 oz)   05/20/23 (!) 102.2 kg (225 lb 6.4 oz)   HEENT: No oropharyngeal lesions.  Neck: Supple without lymphadenopathy or thyromegaly. No carotid bruits.   Lungs: Clear to auscultation   Heart: Regular rate and rhythm. Normal S1-S2.   Abdomen: Protuberant.  Soft, nontender, normoactive bowel sounds.  Liver span is about 9 cm to percussion.  Neurologic: Alert and oriented x 3.  Strength is normal.  DTRs are 2+ and symmetric.  There are no lateralizing sensory deficits.  No asterixis.  Extremities: No pretibial edema.  Dorsalis pedis and posterior tibial pulses are palpable.    Node survey: No lymphadenopathy.   Musculoskeletal: No active synovitis.   Skin/Nails:  No rash or nail changes.      LABORATORY STUDIES:    No results found for this or any previous visit (from the past week).      08/06/2023    Sodium 143 potassium 4.6 chloride 105 bicarbonate 26 BUN 24 creatinine 3.1 EGFR 21 glucose 136 AST 40 ALT 68 alk phos 72 albumin 4.5 total protein 7.5 calcium  10    White count 8.4 hemoglobin 13.9 hematocrit 41.7 lately count 118    Urine albumin to creatinine ratio 38.7    Hemoglobin A1c 5.9%    LDL cholesterol 39 total cholesterol 104 triglycerides 180 HDL cholesterol 30      Serum creatinine trend:    Date Value   08/06/2023 3.10 (H)   04/07/2023 3.50 (H)   03/24/2022 3.00 (H)   03/12/2022 2.62 (H)   09/23/2021 3.26 (H)   06/06/2021 2.81 (H)   02/26/2021 2.18 (H)   01/29/2021 2.63 (H)   01/02/2021 2.62 (H)   11/08/2020 2.70 (H)     Imaging:    Chest CT 06/24/2021    Chest CT shows stability of the right pulmonary nodule.  However, it showed increased bilateral lower lobe groundglass and reticular abnormality may represent changes of infectious or organizing pneumonia (including autoimmune or medication-related etiologies).      IMPRESSION AND PLAN:    1.  Stage G4/A2 chronic kidney disease.      Given the recent introduction of ACE and admission, will obtain follow-up renal function panel today to exclude significant hyperkalemia.  He is not a candidate for deceased donor kidney transplant due to his comorbidities.  His risk of progression to end-stage kidney disease at 2 years is about 7% and about 21% at 5 years.    2. Cognitive impairment.      This is likely multifactorial.  Dr. Sadie is working on minimizing polypharmacy to the extent possible.    3.  History of hyperkalemia.      Reassess the serum potassium level today given his propensity toward hyperkalemia and the recent introduction of lisinopril  40 mg daily.      4.  Hypertension.      His office measured rating is not in goal.  He will monitor home readings.  He will be seeing Dr. Napper in the near future as well as Dr. Celena for follow-up.    5.  Human immunodeficiency virus disease.      He will see Dr. Jewel in June.    6.  Secondary/tertiary hyperparathyroidism.      He has not had signs or symptoms of symptomatic hypercalcemia.  His most recent serum calcium  level was acceptable at 10  mg/dL.    7.  Type 2 diabetes mellitus.      He is doing well on semaglutide 1 mg weekly.  There are rare reports of this drug inducing nonarteritic anterior ischemic optic neuropathy.  I asked him to discuss this with Dr. Charlanne.  Overall, I believe the benefits of this medication with regard to his renal and cardiovascular outcomes outweigh the risk; nonetheless, given his low vision, it would be good to review this with Dr. Charlanne.    8.  Metabolic dysfunction associated with fatty liver disease.      Continue semaglutide.    9.  Chronic low back pain.      Follow-up with pain management.    10. Peripheral neuropathy.      Follow-up with Dr. Sadie.    11. Second degree Mobitz type I (Wenckebach) atrioventricular block.      He does not have symptoms.    12. Follow up plans. I will see him back again in about 4 months.      Elna LABOR. Pricilla, MD  Date of service 01/17/2024

## 2024-01-20 NOTE — Unmapped (Unsigned)
 HISTORY OF PRESENT ILLNESS:      Samuel [name] is a 73 year old man with stage G4/A2 chronic kidney disease who is evaluated today for stage G4/A2 chronic kidney disease.  He continues to struggle with visual loss and has ongoing follow-up at the [facility].  Home blood pressures are averaging about 132/80 mmHg.  His recent labs show a normal serum potassium level.  He has not had increased leg edema, chest pain, flank pain, or visible hematuria.    MEDICATIONS:    Current Outpatient Medications   Medication Instructions    ACCU-CHEK GUIDE GLUCOSE METER Misc FOLLOW PACKAGE DIRECTIONS    ACCU-CHEK GUIDE L1-L2 CTRL SOL Soln     allopurinol  (ZYLOPRIM ) 250 mg, Daily (standard)    amlodipine  (NORVASC ) 10 mg, Oral, Daily (standard)    aspirin (ECOTRIN) 81 MG tablet Daily (standard)    atorvastatin  (LIPITOR) 40 mg, Daily (standard)    atropine 1 % ophthalmic solution INSTILL 1 DROP IN THE RIGHT EYE FOUR TIMES DAILY    blood sugar diagnostic (ONETOUCH VERIO TEST STRIPS) Strp Use 2 (two) times daily E11.22    calcium  carbonate-vitamin D2 500 mg(1,250mg ) -200 unit tablet 1 tablet, 2 times a day (standard)    carvedilol  (COREG ) 12.5 mg, 2 times a day (standard)    cholecalciferol , vitamin D3 25 mcg, 1,000 units,, 1,000 unit (25 mcg) tablet Daily (standard)    cloNIDine  HCL (CATAPRES ) 0.1 mg, Oral, Nightly    diazePAM  (VALIUM ) 5 mg, Nightly    dicyclomine (BENTYL) 20 mg tablet Take 1 tablet (20 mg total) by mouth two (2) times a day.    dorzolamide -timoloL  (COSOPT ) 22.3-6.8 mg/mL ophthalmic solution 1 drop, 2 times a day (standard)    erythromycin (ROMYCIN) 5 mg/gram (0.5 %) ophthalmic ointment APPLY THIN LAYER IN RIGHT EYE AT BEDTIME    ferrous sulfate 325 mg, Daily    folic acid (FOLVITE) 1 MG tablet No dose, route, or frequency recorded.    glimepiride  (AMARYL ) 4 mg, Oral, Daily    hydrALAZINE  (APRESOLINE ) 100 mg, Oral, 2 times a day (standard)    lisinopril  (PRINIVIL ,ZESTRIL ) 40 mg, Oral, Daily (standard) nitroglycerin  (NITROSTAT ) 0.4 mg, Sublingual, Every 5 min PRN, Maximum of 3 doses in 15 minutes.    ofloxacin (OCUFLOX) 0.3 % ophthalmic solution 1 drop, 4 times a day    OZEMPIC 1 mg, Subcutaneous, Every 7 days    PIFELTRO  100 mg, Oral, Daily (standard)    prednisoLONE acetate (PRED FORTE) 1 % ophthalmic suspension 1 drop, Left Eye, 4 times a day    pregabalin (LYRICA) 75 mg, 2 times a day (standard)    ROCKLATAN 0.02-0.005 % Drop INT 1 GTT INTO OU QHS    sertraline (ZOLOFT) 50 MG tablet take 1 tablet by mouth once daily    tamsulosin  (FLOMAX ) 0.4 mg capsule Take 1 capsule (0.4 mg total) by mouth before bedtime.    timolol  (TIMOPTIC ) 0.5 % ophthalmic solution INSTILL 1 DROP IN BOTH EYES EVERY MORNING    TIVICAY  50 mg, Oral, Daily (standard)    torsemide  (DEMADEX ) 20 mg, Oral, Daily (standard)       REVIEW OF SYSTEMS:  Constitutional: No constitutional symptoms.  No dysgeusia.  Cardiovascular: No chest pain.  Pulmonary: No cough or hemoptysis.  All other systems are reviewed and are negative except that noted in the history of present illness.    PHYSICAL EXAMINATION:      Constitutional: He is in no acute distress.  Vital signs:BP 157/101 (BP Site: L Arm,  BP Position: Sitting, BP Cuff Size: Large)  - Pulse 65  - Temp 36.6 ??C (97.8 ??F) (Temporal)  - Ht 177.8 cm (5' 10)  - Wt 97.3 kg (214 lb 9.6 oz)  - BMI 30.79 kg/m??   PTHomeBP  HEENT: No oropharyngeal lesions.  Neck: Supple without lymphadenopathy or thyromegaly. No carotid bruits.   Lungs: Clear to auscultation   Heart: Regular rate and rhythm. Normal S1-S2.   Abdomen: Protuberant with increased tympany throughout.  Soft, nontender, normoactive bowel sounds.  Liver span is about 9 cm to percussion.  Neurologic: Alert and oriented x 3.  Strength is normal.  DTRs are 2+ and symmetric.  There are no lateralizing sensory deficits.  No asterixis.  Extremities: No pretibial edema.  Dorsalis pedis and posterior tibial pulses are palpable.    Node survey: No lymphadenopathy.   Musculoskeletal: No active synovitis.   Skin/Nails:  No rash or nail changes.      LABORATORY STUDIES:    Labs from 01/17/2024 show sodium 143 potassium 4.2 chloride 105 bicarbonate 32 BUN 20 creatinine 2.7 EGFR 24 glucose 74 calcium  9.9 AST 27 ALT 48 alk phos 68 albumin 4.5 bilirubin 0.8 total protein 7.4 white count 7.1 hemoglobin 13.8 hematocrit 41.6 platelets 121    Hemoglobin A1c 5.6%.    Urine albumin to creatinine ratio is 90    Results for orders placed or performed in visit on 02/01/24   Protein/Creatinine Ratio, Urine   Result Value Ref Range    Creat U 49.0 Undefined mg/dL    Protein, Ur 84.7 Undefined mg/dL    Protein/Creatinine Ratio, Urine 0.310 Undefined     *Note: Due to a large number of results and/or encounters for the requested time period, some results have not been displayed. A complete set of results can be found in Results Review.         Serum creatinine trend:    Date Value   01/17/2024 2.70 (H)   08/06/2023 3.10 (H)   04/07/2023 3.50 (H)   03/24/2022 3.00 (H)   03/12/2022 2.62 (H)   09/23/2021 3.26 (H)   06/06/2021 2.81 (H)   02/26/2021 2.18 (H)   01/29/2021 2.63 (H)   01/02/2021 2.62 (H)   11/08/2020 2.70 (H)     Imaging:    No new imaging studies are available for review.    IMPRESSION AND PLAN:    1.  Stage G4/A2 chronic kidney disease.  Thankfully, his kidney function remains quite stable.  He continues to have about a 20% risk of progression to end-stage kidney disease at 5 years.  Home blood pressures are close to goal.    2. History of hyperkalemia.  His serum potassium level is acceptable.    3.  Hypertension.  He will continue on his current regimen of amlodipine  10 mg daily, carvedilol  12.5 mg twice daily, clonidine  0.1 mg nightly, lisinopril  40 mg daily, and hydralazine  100 mg twice daily.    4.  Human immunodeficiency virus disease.  His recent HIV indices have been relatively stable.  He has follow-up with Dr. Seward in January 2026.    5.  Secondary/tertiary hyperparathyroidism.  Serum calcium  levels acceptable.    6.  Type 2 diabetes mellitus.  Most recent hemoglobin A1c was excellent at 5.6%.    7.  Metabolic dysfunction associated with fatty liver disease.  Continue glimepiride .    8.  Peripheral neuropathy.  He will continue to see Dr. Sadie for this issue.    9.  Second  degree Mobitz type I (Wenckebach) atrioventricular block.  He remains asymptomatic.    10. Follow up plans. I will see him back again in about 4 months.      Elna LABOR. Pricilla, MD  Date of service 02/01/2024 infectious or organizing pneumonia (including autoimmune or medication-related etiologies).      IMPRESSION AND PLAN:    1.  Stage G4/A2 chronic kidney disease.      Given the recent introduction of ACE and admission, will obtain follow-up renal function panel today to exclude significant hyperkalemia.  He is not a candidate for deceased donor kidney transplant due to his comorbidities.  His risk of progression to end-stage kidney disease at 2 years is about 7% and about 21% at 5 years.    2. Cognitive impairment.      This is likely multifactorial.  Dr. Sadie is working on minimizing polypharmacy to the extent possible.    3.  History of hyperkalemia.      Reassess the serum potassium level today given his propensity toward hyperkalemia and the recent introduction of lisinopril  40 mg daily.      4.  Hypertension.      His office measured rating is not in goal.  He will monitor home readings.  He will be seeing Dr. Napper in the near future as well as Dr. Celena for follow-up.    5.  Human immunodeficiency virus disease.      He will see Dr. Jewel in June.    6.  Secondary/tertiary hyperparathyroidism.      He has not had signs or symptoms of symptomatic hypercalcemia.  His most recent serum calcium  level was acceptable at 10 mg/dL.    7.  Type 2 diabetes mellitus.      He is doing well on semaglutide 1 mg weekly.  There are rare reports of this drug inducing nonarteritic anterior ischemic optic neuropathy.  I asked him to discuss this with Dr. Charlanne.  Overall, I believe the benefits of this medication with regard to his renal and cardiovascular outcomes outweigh the risk; nonetheless, given his low vision, it would be good to review this with Dr. Charlanne.    8.  Metabolic dysfunction associated with fatty liver disease.      Continue semaglutide.    9.  Chronic low back pain.      Follow-up with pain management.    10. Peripheral neuropathy.      Follow-up with Dr. Sadie.    11. Second degree Mobitz type I (Wenckebach) atrioventricular block.      He does not have symptoms.    12. Follow up plans. I will see him back again in about 4 months.      Elna LABOR. Pricilla, MD  Date of service 02/01/2024

## 2024-01-26 DIAGNOSIS — B20 Human immunodeficiency virus [HIV] disease: Principal | ICD-10-CM

## 2024-01-26 MED ORDER — TIVICAY 50 MG TABLET
ORAL_TABLET | Freq: Every day | ORAL | 3 refills | 90.00000 days | Status: CP
Start: 2024-01-26 — End: 2025-01-20
  Filled 2024-02-02: qty 30, 30d supply, fill #0

## 2024-01-26 NOTE — Unmapped (Signed)
 Medication Requested: tivicay       Future Appointments   Date Time Provider Department Center   02/01/2024 10:05 AM Pricilla Elna Dawn, MD [facility]   05/04/2024  1:30 PM Hurt, Lonni Schlossman, MD [facility]     Per Provider Note:   This medication is included in this patients clinical summary.    Standing order protocol requirements met?: Yes    Sent to: Pharmacy per protocol    Days Supply Given: 30 days  Number of Refills: 4

## 2024-01-26 NOTE — Unmapped (Signed)
 [facility] Refill Coordination Note    Specialty Medication(s) to be Shipped:   Infectious Disease: doravirine  (PIFELTRO ) 100 mg Tab  and Tivicay     Other medication(s) to be shipped: No additional medications requested for fill at this time    Specialty Medications not needed at this time: N/A     Samuel [name], DOB: [birth year]  Phone: [phone] (home)       All above HIPAA information was verified with patient.     Was a Nurse, learning disability used for this call? No    Completed refill call assessment today to schedule patient's medication shipment from the [facility]  [phone]).  All relevant notes have been reviewed.     Specialty medication(s) and dose(s) confirmed: Regimen is correct and unchanged.   Changes to medications: Samuel [name] reports no changes at this time.  Changes to insurance: No  New side effects reported not previously addressed with a pharmacist or physician: None reported  Questions for the pharmacist: No    Confirmed patient received a Conservation officer, historic buildings and a Surveyor, mining with first shipment. The patient will receive a drug information handout for each medication shipped and additional FDA Medication Guides as required.       DISEASE/MEDICATION-SPECIFIC INFORMATION        N/A    SPECIALTY MEDICATION ADHERENCE     Medication Adherence    Patient reported X missed doses in the last month: 0  Specialty Medication: dolutegravir  (TIVICAY ) 50 mg TABLET  Patient is on additional specialty medications: Yes  Additional Specialty Medications: doravirine  (PIFELTRO ) 100 mg Tab   Patient Reported Additional Medication X Missed Doses in the Last Month: 0  Patient is on more than two specialty medications: No  Informant: patient              Were doses missed due to medication being on hold? No    doravirine  (PIFELTRO ) 100 mg Tab : 10 doses of medicine on hand    dolutegravir  (TIVICAY ) 50 mg TABLET : 10 doses of medicine on hand REFERRAL TO PHARMACIST     Referral to the pharmacist: Not needed      SHIPPING     Shipping address confirmed in Epic.     Cost and Payment: Patient has a $0 copay, payment information is not required.    Delivery Scheduled: Yes, Expected medication delivery date: 9/4.     Medication will be delivered via Next Day Courier to the prescription address in Epic WAM.    Samuel [name] [facility]

## 2024-01-26 NOTE — Unmapped (Signed)
 [facility]  [address]  [place], [place] [address]  PHONE: [phone]  FAX: [phone]     CLINICAL PHARMACIST PRACTITIONER NOTE   INFECTIOUS DISEASES    Referring Infectious Diseases Specialist: Hurt, Lonni Schlossman, MD  Primary Care Provider: [name], MD    Reason for Office Visit: HIV Medication Therapy Management (MTM)    History of Present Illness: Samuel [name] is a 73 y.o. year old male living with HIV and multiple comorbidities who presents today for a MTM visit with the [name] and discuss various options for pill-pack pharmacies.    Allergies: Allergies[1]    PMH/PSH: Past Medical History[2]    HIV Care:  HIV Viral Load:   Lab Results   Component Value Date    HIVRS Detected (A) 05/04/2023    HIVRS Detected (A) 08/06/2022    HIVRS Detected (A) 03/12/2022    HIVRS Detected (A) 09/23/2021    HIVCP <20 (H) 05/04/2023    HIVCP <20 (H) 08/06/2022    HIVCP <20 (H) 03/12/2022    HIVCP <20 (H) 09/23/2021    HIV10  05/04/2023      Comment:      <1.30 log    HIV10  08/06/2022      Comment:      <1.30 log    HIV10  03/12/2022      Comment:      <1.30 log    HIV10  09/23/2021      Comment:      <1.30 log      CD4 Count:   Lab Results   Component Value Date    ACD4 782 05/04/2023    ACD4 770 08/06/2022    ACD4 612 09/23/2021    ACD4 720 02/26/2021     CD4%:   Lab Results   Component Value Date    CD4 34 05/04/2023    CD4 35 08/06/2022    CD4 34 09/23/2021    CD4 36 02/26/2021     Hepatitis C:   Lab Results   Component Value Date    HEPCAB Reactive (A) 01/29/2020      Hepatitis B:   Lab Results   Component Value Date    HBSAG Nonreactive 01/31/2020    HBSAG Nonreactive 01/29/2020    HBSAG Nonreactive 03/11/2016        Current ART:   CURRENT OUTPATIENT ADMINISTERED ANTIRETROVIRAL MEDICATIONS   Medication Sig Dispense Refill    dolutegravir  (TIVICAY ) 50 mg TABLET Take 1 tablet (50 mg total) by mouth daily. 90 tablet 3    doravirine  (PIFELTRO ) 100 mg Tab Take 1 tablet (100 mg total) by mouth daily.  30 tablet 11       Current Non-ART Medications: Current Medications[3]      Assessment and Plan:   #MTM:     01/26/2024  Followed up with Samuel [name], (Samuel [name]'s wife) to inform her that Samuel [name] PCP reviewed the medication list and approved it. Per Samuel [name]'s request, initiated prescription transfer to [facility] in [place] and informed them of the patient's request to enroll within the [employer] Compliance Program. Pharmacy technician informed me that the patient should call [name] in about 2 hours to ensure the transfer from [name] was successful, provide delivery address, discuss any potential copays, and to discuss the [employer] Compliance Program. Per the patient's request, all of the oral medications were transferred and the Ozempic. The ophthalmic medications remain at [facility] in [place], [place] and the ART (DOR +  DTG) remain with [facility].     01/05/2024  Followed up with patient and his spouse today regarding transferring his medications to a pill-pack pharmacy. I informed them that I am still looking to hear back from his PCP to confirm that the medication list is accurate prior to transfer. Patient has a follow up appointment with his PCP on 8/25 and will remind them to follow up with me. I will reach out to PCP again.     12/13/2023  Patient and his spouse presented to the appointment for a medication management/reconciliation visit. I proceeded to take inventory of his medications and document in the [facility]. I reviewed the medications that the patient bought with him to his appointment and updated the list in EPIC accordingly. For medications that the patient did not bring to the clinic visit, I noted that below as well.   Allopurinol  100 mg tablet  Take two and one-half tablets (250 mg) by mouth daily   Amlodipine  10 mg tablet  Take one tablet by mouth daily  Patient didn't bring physical bottle as he forgot it on the counter at home. He did verify name, dose, indication, and route   Aspirin 81 mg tablet  Take one tablet by mouth daily  Patient didn't bring physical bottle as he forgot it at home  Atorvastatin  40 mg tablet  Take one tablet by mouth daily  Carvedilol  12.5 mg tablet  Take one tablet by mouth twice daily  Cholecalciferol  (vitamin D3 25 mcg)  Patient reports taking one tablet by mouth weekly  Clonidine  0.1 mg tablet  Take one tablet by mouth nightly  Dicyclomine 20 mg tablet  Take one tablet by mouth twice daily   Dolutegravir  50 mg tablet  Take one tablet by mouth daily  Doravirine  100 mg tablet  Take one tablet by mouth daily  Glimepiride  4 mg tablet  Take one tablet by mouth daily with breakfast  Hydralazine  100 mg tablet  Take one tablet by mouth twice daily  Patient didn't bring physical bottle, and was unsure if he is taking this medication or not   This medication doesn't appear on his dispense history   Lisinopril  40 mg tablet   Take one tablet by mouth daily  Nitroglycerin  0.4 mg SL tablet  Place one tablet under the tongue every five minutes as needed for chest pain. Max 3 doses in fifteen minutes  Ozempic (Semaglutide) 1mg   Inject 1 mg under the skin every seven days  Sertraline 50 mg tablet  Take one tablet by mouth daily.   Patient stated that he has difficulty sleeping at night. I suggested taking the sertraline during the day to see if this helps  Tamsulosin  0.4 mg capsule  Take one capsule by mouth at daily  Patient reports taking this at bedtime  Torsemide  10 mg tablet  Take two tablets by mouth daily    Updated medication list in EPIC and indicated which medications the patient is no longer taking.   Patient stated having the following concerns:  Patient requests a new glucose monitor as the one he has no longer works  Patient is also requesting a blood pressure monitor with a larger cuff. Informed him that I will take a look into it.   Patient is taking a One-a-Day multivitamin and is also taking a separate vitamin B-12 (500 mcg) once daily. Patient would like to know if he needs the additional B-12 supplementation or is the MVT alone sufficient.  Pill-Pack/Blister-pack pharmacies   [facility] in [place]   The pharmacy offers this service as a courtesy to patients and charges $4 per pack per month. The overall cost depends on how many packs are dispensed. The overall cost for Mr. Cybulski is estimated to be around $8 to $16 monthly. The pharmacy is able to deliver medications as well. Since this pharmacy is in [place], the patient's spouse will most likely pick up the medications directly.   [facility] in [place] that offers blister packs to patients for no additional cost. Pharmacy is also able to deliver.  [name] in [place]  Patients are eligible to enroll in the [employer] Compliance Program for no additional cost to the patient.   Pharmacy can also deliver to patient for no additional costs.  PLAN:  Share updated medication list with the patient's providers to ensure that the list is accurate and nothing is missing.   Once list has been confirmed by the providers, [name] will initiate transfer of prescriptions to patient's pharmacy of choice and ensure blister packaging.   [name] will follow up with patient afterwards to see how patient is doing with the blister packs.     Importance of informing pharmacy and clinic of updated contact information. Stressed importance of being able to reach over the phone to set up refills. Advised patient to call when down to about 14 day supply left to ensure there's no interruption in therapy.    Patient verbalized understanding of counseling. Provided contact information for any further questions/concerns.       #Medication Adherence and Access:  Since last visit, patient denies missing doses of any of his medications.  Is the pharmacy on file the one you are currently using to fill your medication(s) through [facility] pharmacy with patient)? [id number]  [facility] for [place]  and Pifeltro   Do you ever forget to take your medications? Unsure; He thinks he may have missed some of his medications.   Do any of your medications make you sick? No  Are you experiencing any barriers to care like: transportation to medical appointments, stable housing, lack of food, etc.? Yes; Visual impairment. Spouse drives him to visits.    Follow-up:  Next [name] follow up TBD  Next ID visit 05/04/2024 with Hurt, Lonni Schlossman, MD    I spent a total of 15 minutes on the phone with the patient delivering clinical care and providing education/counseling.       Camille Evans, PharmD, BCIDP, [name], AAHIVP  Clinical Pharmacist Practitioner - HIV/Infectious Diseases    [facility]  [address]  [place], [place]  [address]  P [phone]  F [phone]        [1]   Allergies  Allergen Reactions    Colchicine Analogues Diarrhea     Have diarrhea when taken for long periods of time    Tramadol Other (See Comments)     unknown tolerates morphine   [2]   Past Medical History:  Diagnosis Date    Abnormal EKG 11/09/2015    Acute encephalopathy 11/30/2016    Anemia in stage 3 chronic kidney disease (CMS-HCC) 01/19/2015    Last Assessment & Plan: Formatting of this note might be different from the original. # Severe iron deficiency anemia/hemoglobin- 6/ ferritin 5 [march 2017]. Status post IV iron- significant improvement of hemoglobin. Last IV Feraheme was 08/28/2015 where he received 510 mg. #Was seen by Dr. Rennie in  February 2019 with complaints of worsening significant pica/fatigue/cold intolerance.  Started    Bradycardia 11/09/2015    Chronic hepatitis C without hepatic coma     01/19/2015    CKD (chronic kidney disease) stage 3, GFR 30-59 ml/min (CMS-HCC) 01/08/2015    Coronary artery disease 10/2015    Multivessel disease, plan medical management after cath 6/17    GERD (gastroesophageal reflux disease)     Gout HCV (hepatitis C virus) 11/22/2012    (genotype 1, HCV RNA =311,000; liver biopsy 05/2005 =chronic hepatitis grade II stage II).  Has consistently and repeatedly refused therapy.       Hepatitis B core antibody positive 11/18/2023    HIV (human immunodeficiency virus infection)         Undectable viral load 5/17    Hypertension     Nephrolithiasis     Nephrotic syndrome 03/05/2014    Neurological deficit present 11/13/2015    Weakness in lower extremities    Personal history of gout 10/27/2018    Renal mass     Followed by neprhology, MRI 8/16 improved, felt to be benign   [3]   Current Outpatient Medications:     ACCU-CHEK GUIDE GLUCOSE METER Misc, FOLLOW PACKAGE DIRECTIONS, Disp: , Rfl:     ACCU-CHEK GUIDE L1-L2 CTRL SOL Soln, , Disp: , Rfl:     albuterol (PROVENTIL HFA;VENTOLIN HFA) 90 mcg/actuation inhaler, Inhale 2 puffs. Take as needed (Patient not taking: Reported on 12/13/2023), Disp: , Rfl:     allopurinoL  (ZYLOPRIM ) 100 MG tablet, Take 2.5 tablets (250 mg total) by mouth daily., Disp: , Rfl:     amlodipine  (NORVASC ) 10 MG tablet, Take 1 tablet (10 mg total) by mouth daily., Disp: 90 tablet, Rfl: 3    aspirin (ECOTRIN) 81 MG tablet, daily. , Disp: , Rfl: 0    atenolol  (TENORMIN ) 100 MG tablet, Take 1 tablet (100 mg total) by mouth daily. (Patient not taking: Reported on 12/13/2023), Disp: 100 tablet, Rfl: 3    atorvastatin  (LIPITOR) 40 MG tablet, Take 1 tablet (40 mg total) by mouth daily., Disp: , Rfl:     atropine 1 % ophthalmic solution, INSTILL 1 DROP IN THE RIGHT EYE FOUR TIMES DAILY, Disp: , Rfl:     blood sugar diagnostic (ONETOUCH VERIO TEST STRIPS) Strp, Use 2 (two) times daily E11.22, Disp: , Rfl:     calcium  carbonate-vitamin D2 500 mg(1,250mg ) -200 unit tablet, Take 1 tablet (500 mg total) by mouth two (2) times a day. (Patient not taking: Reported on 12/13/2023), Disp: , Rfl:     carvedilol  (COREG ) 12.5 MG tablet, Take 1 tablet (12.5 mg total) by mouth two (2) times a day., Disp: , Rfl: cholecalciferol , vitamin D3 25 mcg, 1,000 units,, 1,000 unit (25 mcg) tablet, Take by mouth daily. (Patient taking differently: Take 1 tablet (25 mcg total) by mouth once a week.), Disp: , Rfl:     cloNIDine  HCL (CATAPRES ) 0.1 MG tablet, Take 1 tablet (0.1 mg total) by mouth nightly., Disp: , Rfl:     diazepam  (VALIUM ) 5 MG tablet, Take 1 tablet (5 mg total) by mouth nightly. (Patient not taking: Reported on 12/13/2023), Disp: , Rfl:     dicyclomine (BENTYL) 20 mg tablet, Take 1 tablet (20 mg total) by mouth two (2) times a day., Disp: , Rfl:     docusate sodium (COLACE) 100 MG capsule, Take 1 capsule (100 mg total) by mouth every hour as needed. (Patient not taking: Reported on 12/13/2023), Disp: ,  Rfl:     dolutegravir  (TIVICAY ) 50 mg TABLET, Take 1 tablet (50 mg total) by mouth daily., Disp: 90 tablet, Rfl: 3    doravirine  (PIFELTRO ) 100 mg Tab, Take 1 tablet (100 mg total) by mouth daily., Disp: 30 tablet, Rfl: 11    dorzolamide -timoloL  (COSOPT ) 22.3-6.8 mg/mL ophthalmic solution, Administer 1 drop to both eyes two (2) times a day., Disp: , Rfl:     erythromycin (ROMYCIN) 5 mg/gram (0.5 %) ophthalmic ointment, APPLY THIN LAYER IN RIGHT EYE AT BEDTIME, Disp: , Rfl:     ferrous sulfate 325 (65 FE) MG tablet, Take 1 tablet (325 mg total) by mouth in the morning. (Patient not taking: Reported on 12/13/2023), Disp: , Rfl:     folic acid (FOLVITE) 1 MG tablet, , Disp: , Rfl: 0    glimepiride  (AMARYL ) 4 MG tablet, Take 1 tablet (4 mg total) by mouth in the morning., Disp: 90 tablet, Rfl: 3    hydrALAZINE  (APRESOLINE ) 100 MG tablet, Take 1 tablet (100 mg total) by mouth two (2) times a day., Disp: , Rfl:     HYDROcodone-acetaminophen (NORCO) 5-325 mg per tablet, Directions are take one tablet 30 minutes before physical therapy, then take one tablet at bedtime as needed for pain (Patient not taking: Reported on 12/13/2023), Disp: , Rfl:     lisinopril  (PRINIVIL ,ZESTRIL ) 40 MG tablet, Take 1 tablet (40 mg total) by mouth daily., Disp: 90 tablet, Rfl: 3    naloxone  (NARCAN ) 4 mg nasal spray, Call 911. Administer a single spray of narcan  in on nostril. Repeat every 3 minutes as needed if no or minimal response. (Patient not taking: Reported on 12/13/2023), Disp: 1 each, Rfl: 99    nitroglycerin  (NITROSTAT ) 0.4 MG SL tablet, Place 1 tablet (0.4 mg total) under the tongue every five (5) minutes as needed for chest pain. Maximum of 3 doses in 15 minutes., Disp: 25 tablet, Rfl: 0    nystatin (MYCOSTATIN) 100,000 unit/mL suspension, Take 5 mL (500,000 Units total) by mouth daily. (Patient not taking: Reported on 12/13/2023), Disp: , Rfl:     ofloxacin (OCUFLOX) 0.3 % ophthalmic solution, Administer 1 drop into the left eye four (4) times a day., Disp: , Rfl:     omega-3 fatty acids-fish oil 340-1,000 mg capsule, Take 1 capsule by mouth two (2) times a day. (Patient not taking: Reported on 12/13/2023), Disp: , Rfl:     ondansetron (ZOFRAN) 4 MG tablet, , Disp: , Rfl:     prednisoLONE acetate (PRED FORTE) 1 % ophthalmic suspension, Administer 1 drop into the left eye four (4) times a day., Disp: , Rfl:     pregabalin (LYRICA) 75 MG capsule, Take 1 capsule (75 mg total) by mouth two (2) times a day., Disp: , Rfl:     ROCKLATAN 0.02-0.005 % Drop, INT 1 GTT INTO OU QHS, Disp: , Rfl:     semaglutide (OZEMPIC) 1 mg/dose (2 mg/1.5 mL) PnIj, Inject 1 mg under the skin every seven (7) days., Disp: , Rfl:     sertraline (ZOLOFT) 50 MG tablet, take 1 tablet by mouth once daily, Disp: , Rfl:     tamsulosin  (FLOMAX ) 0.4 mg capsule, Take 1 capsule (0.4 mg total) by mouth before bedtime., Disp: , Rfl:     timolol  (TIMOPTIC ) 0.5 % ophthalmic solution, INSTILL 1 DROP IN BOTH EYES EVERY MORNING, Disp: , Rfl:     torsemide  (DEMADEX ) 10 MG tablet, Take 2 tablets (20 mg total) by mouth daily., Disp: , Rfl:

## 2024-02-01 ENCOUNTER — Ambulatory Visit: Admit: 2024-02-01 | Discharge: 2024-02-02 | Payer: Medicare (Managed Care) | Attending: Nephrology | Primary: Nephrology

## 2024-02-01 DIAGNOSIS — H4089 Other specified glaucoma: Principal | ICD-10-CM

## 2024-02-01 DIAGNOSIS — E1140 Type 2 diabetes mellitus with diabetic neuropathy, unspecified: Principal | ICD-10-CM

## 2024-02-01 DIAGNOSIS — I5032 Chronic diastolic (congestive) heart failure: Principal | ICD-10-CM

## 2024-02-01 DIAGNOSIS — F3340 Major depressive disorder, recurrent, in remission, unspecified: Principal | ICD-10-CM

## 2024-02-01 DIAGNOSIS — N184 Chronic kidney disease, stage 4 (severe): Principal | ICD-10-CM

## 2024-02-01 DIAGNOSIS — M1000 Idiopathic gout, unspecified site: Principal | ICD-10-CM

## 2024-02-01 DIAGNOSIS — I151 Hypertension secondary to other renal disorders: Principal | ICD-10-CM

## 2024-02-01 DIAGNOSIS — B20 Human immunodeficiency virus [HIV] disease: Principal | ICD-10-CM

## 2024-02-01 DIAGNOSIS — I214 Non-ST elevation (NSTEMI) myocardial infarction: Principal | ICD-10-CM

## 2024-02-01 DIAGNOSIS — K760 Fatty (change of) liver, not elsewhere classified: Principal | ICD-10-CM

## 2024-02-01 LAB — PROTEIN / CREATININE RATIO, URINE
CREATININE, URINE: 49 mg/dL
PROTEIN URINE: 15.2 mg/dL
PROTEIN/CREAT RATIO, URINE: 0.31

## 2024-02-01 NOTE — Unmapped (Addendum)
 Continue current medications  Return in 4 months    Your kidneys are quite stable. It was great to see you today.

## 2024-02-02 MED FILL — PIFELTRO 100 MG TABLET: ORAL | 30 days supply | Qty: 30 | Fill #11

## 2024-02-08 LAB — COLOGUARD

## 2024-02-22 DIAGNOSIS — B20 Human immunodeficiency virus [HIV] disease: Principal | ICD-10-CM

## 2024-02-22 DIAGNOSIS — N184 Chronic kidney disease, stage 4 (severe): Principal | ICD-10-CM

## 2024-02-22 DIAGNOSIS — Z79899 Other long term (current) drug therapy: Principal | ICD-10-CM

## 2024-02-22 LAB — COLOGUARD: COLOGUARD: POSITIVE — AB

## 2024-02-22 MED ORDER — PIFELTRO 100 MG TABLET
ORAL_TABLET | Freq: Every day | ORAL | 11 refills | 30.00000 days
Start: 2024-02-22 — End: ?

## 2024-02-23 MED ORDER — PIFELTRO 100 MG TABLET
ORAL_TABLET | Freq: Every day | ORAL | 0 refills | 90.00000 days | Status: CP
Start: 2024-02-23 — End: ?
  Filled 2024-02-28: qty 30, 30d supply, fill #0

## 2024-02-23 NOTE — Unmapped (Signed)
 [facility] Clinical Assessment & Refill Coordination Note    Samuel [name], DOB: Jan 23, 1951  Phone: [phone] (home)     All above HIPAA information was verified with patient's family member, wife Samuel [name].     Was a Nurse, learning disability used for this call? No    Specialty Medication(s):   Infectious Disease: Pifeltro  and Tivicay      Current Medications[1]     Changes to medications: Amario reports no changes at this time.    Medication list has been reviewed and updated in Epic: unable to assess, was not speaking w/patient but wife stated no changes since last month    Allergies[2]    Changes to allergies: No    Allergies have been reviewed and updated in Epic: Yes    SPECIALTY MEDICATION ADHERENCE     Tivicay  50 mg: 7 days of medicine on hand   Pifeltro  100 mg: 7 days of medicine on hand       Medication Adherence    Patient reported X missed doses in the last month: 0  Specialty Medication: Pifeltro  100mg  QD  Patient is on additional specialty medications: Yes  Additional Specialty Medications: Tivicay  50mg  QD  Patient Reported Additional Medication X Missed Doses in the Last Month: 0  Patient is on more than two specialty medications: No  Informant: spouse          Specialty medication(s) dose(s) confirmed: Regimen is correct and unchanged.     Are there any concerns with adherence? No    Adherence counseling provided? Not needed    CLINICAL MANAGEMENT AND INTERVENTION      Clinical Benefit Assessment:    Do you feel the medicine is effective or helping your condition? Patient declined to answer    Clinical Benefit counseling provided? Labs from 05/2023 show evidence of clinical benefit    Adverse Effects Assessment:    Are you experiencing any side effects? No    Are you experiencing difficulty administering your medicine? No    Quality of Life Assessment:    Quality of Life    Rheumatology  Oncology  Dermatology  Cystic Fibrosis          How many days over the past month did your HIV keep you from your normal activities? For example, brushing your teeth or getting up in the morning. Patient declined to answer    Have you discussed this with your provider? Not needed    Acute Infection Status:    Acute infections noted within Epic:  No active infections    Patient reported infection: None    Therapy Appropriateness:    Is the medication and dose appropriate considering the patient???s diagnosis, treatment, and disease journey, comorbidities, medical history, current medications, allergies, therapeutic goals, self-administration ability, and access barriers? Yes, therapy is appropriate and should be continued     Clinical Intervention:    Was an intervention completed as part of this clinical assessment? No    DISEASE/MEDICATION-SPECIFIC INFORMATION      N/A    HIV: Not Applicable    HIV ASSOCIATED LABS:     Lab Results   Component Value Date/Time    HIVRS Detected (A) 05/04/2023 12:58 PM    HIVRS Detected (A) 08/06/2022 02:15 PM    HIVRS Detected (A) 03/12/2022 10:32 AM    HIVRS Not Detected 08/15/2014 11:42 AM    HIVRS Not Detected 01/03/2014 10:46 AM    HIVRS Not Detected 11/23/2012 12:53 PM    HIVCP <  20 (H) 05/04/2023 12:58 PM    HIVCP <20 (H) 08/06/2022 02:15 PM    HIVCP <20 (H) 03/12/2022 10:32 AM    HIVCP <40 01/02/2021 10:26 AM    HIVCP <40 05/09/2020 03:11 PM    HIVCP <40 05/16/2019 02:24 PM    ACD4 782 05/04/2023 12:58 PM    ACD4 770 08/06/2022 02:15 PM    ACD4 612 09/23/2021 09:29 AM    ACD4 514 08/01/2014 09:35 AM    ACD4 430 (L) 01/03/2014 10:46 AM    ACD4 549 11/23/2012 12:53 PM         PATIENT SPECIFIC NEEDS     Does the patient have any physical, cognitive, or cultural barriers? No    Is the patient high risk? No    Does the patient require physician intervention or other additional services (i.e., nutrition, smoking cessation, social work)? No    Does the patient have an additional or emergency contact listed in their chart? Yes    SOCIAL DETERMINANTS OF HEALTH     At the [facility], we have learned that life circumstances - like trouble affording food, housing, utilities, or transportation can affect the health of many of our patients.   That is why we wanted to ask: are you currently experiencing any life circumstances that are negatively impacting your health and/or quality of life? Patient declined to answer    Social Drivers of Health     Food Insecurity: No Food Insecurity (01/24/2024)    Received from [facility]    Hunger Vital Sign     Within the past 12 months, you worried that your food would run out before you got the money to buy more.: Never true     Within the past 12 months, the food you bought just didn't last and you didn't have money to get more.: Never true   Tobacco Use: Medium Risk (02/01/2024)    Patient History     Smoking Tobacco Use: Former     Smokeless Tobacco Use: Never     Passive Exposure: Never   Transportation Needs: No Transportation Needs (01/24/2024)    Received from [facility]    PRAPARE - Transportation     In the past 12 months, has lack of transportation kept you from medical appointments or from getting medications?: No     Lack of Transportation (Non-Medical): No   Alcohol Use: Not At Risk (09/20/2019)    Received from [facility]    AUDIT-C     Q1: How often do you have a drink containing alcohol?: Never     Average Number of Drinks: Not on file     Frequency of Binge Drinking: Not on file   Housing: Low Risk  (01/24/2024)    Received from [facility]    Housing Stability Vital Sign     In the last 12 months, was there a time when you were not able to pay the mortgage or rent on time?: No     In the past 12 months, how many times have you moved where you were living?: 0     At any time in the past 12 months, were you homeless or living in a shelter (including now)?: No   Physical Activity: Inactive (09/20/2019)    Received from [facility]    Exercise Vital Sign     On average, how many days per week do you engage in  moderate to strenuous exercise (like a brisk walk)?: 0 days     On average, how many minutes do you engage in exercise at this level?: 0 min   Utilities: Not At Risk (01/24/2024)    Received from [facility] Utilities     In the past 12 months has the electric, gas, oil, or water company threatened to shut off services in your home?: No   Stress: Stress Concern Present (09/20/2019)    Received from [facility] - Occupational Stress Questionnaire   Interpersonal Safety: Patient Unable To Answer (09/03/2023)    Interpersonal Safety     Unsafe Where You Currently Live: Patient unable to answer     Physically Hurt by Anyone: Patient unable to answer     Abused by Anyone: Patient unable to answer   Substance Use: Not on file (04/09/2023)   Intimate Partner Violence: Patient Unable To Answer (09/03/2023)    Humiliation, Afraid, Rape, and Kick questionnaire     Fear of Current or Ex-Partner: Patient unable to answer     Emotionally Abused: Patient unable to answer     Physically Abused: Patient unable to answer     Sexually Abused: Patient unable to answer   Social Connections: Moderately Isolated (09/20/2019)    Received from [facility]    Social Connection and Isolation Panel     In a typical week, how many times do you talk on the phone with family, friends, or neighbors?: More than three times a week     How often do you get together with friends or relatives?: More than three times a week     How often do you attend church or religious services?: Never     Do you belong to any clubs or organizations such as church groups, unions, fraternal or athletic groups, or school groups?: No     How often do you attend meetings of the clubs or organizations you belong to?: Never     Are you married, widowed, divorced, separated, never married, or living with a partner?: Married   Physicist, medical Strain: Low Risk  (01/24/2024)    Received from [facility]    Overall Financial Resource Strain (CARDIA)     Difficulty of Paying Living Expenses: Not hard at all   Health Literacy: Not on file   Internet Connectivity: Not on file       Would you be willing to receive help with any of the needs that you have identified today? Not applicable       SHIPPING     Specialty Medication(s) to be Shipped:   Infectious Disease: Pifeltro  and Tivicay     Other medication(s) to be shipped: No additional medications requested for fill at this time    Specialty Medications not needed at this time: N/A     Changes to insurance: No    Cost and Payment: Patient has a $0 copay, payment information is not required.    Delivery Scheduled: Yes, Expected medication delivery date: 02/29/24.     Medication will be delivered via Next Day Courier to the confirmed prescription address in [facility].    The patient will receive a drug information handout for each medication shipped and additional FDA Medication Guides as required.  Verified that patient has previously received a Conservation officer, historic buildings and a Surveyor, mining.    The patient or  caregiver noted above participated in the development of this care plan and knows that they can request review of or adjustments to the care plan at any time.      All of the patient's questions and concerns have been addressed.    Aleck CHRISTELLA Gaskins, PharmD   [facility] Specialty Pharmacist       [1]   Current Outpatient Medications   Medication Sig Dispense Refill    diazepam  (VALIUM ) 5 MG tablet Take 1 tablet (5 mg total) by mouth nightly.      HYDROcodone-acetaminophen (NORCO) 5-325 mg per tablet Take 1 tablet by mouth daily as needed for pain.      pregabalin (LYRICA) 75 MG capsule Take 1 capsule (75 mg total) by mouth two (2) times a day.      ACCU-CHEK GUIDE GLUCOSE METER Misc FOLLOW PACKAGE DIRECTIONS      ACCU-CHEK GUIDE L1-L2 CTRL SOL Soln       allopurinoL  (ZYLOPRIM ) 100 MG tablet Take 2.5 tablets (250 mg total) by mouth daily.      amlodipine  (NORVASC ) 10 MG tablet Take 1 tablet (10 mg total) by mouth daily. 90 tablet 3    aspirin (ECOTRIN) 81 MG tablet daily.   0    atorvastatin  (LIPITOR) 40 MG tablet Take 1 tablet (40 mg total) by mouth daily.      atropine 1 % ophthalmic solution INSTILL 1 DROP IN THE RIGHT EYE FOUR TIMES DAILY      blood sugar diagnostic (ONETOUCH VERIO TEST STRIPS) Strp Use 2 (two) times daily E11.22      calcium  carbonate-vitamin D2 500 mg(1,250mg ) -200 unit tablet Take 1 tablet (500 mg total) by mouth two (2) times a day. (Patient not taking: Reported on 12/13/2023)      carvedilol  (COREG ) 12.5 MG tablet Take 1 tablet (12.5 mg total) by mouth two (2) times a day.      cholecalciferol , vitamin D3 25 mcg, 1,000 units,, 1,000 unit (25 mcg) tablet Take by mouth daily. (Patient taking differently: Take 1 tablet (25 mcg total) by mouth once a week.)      cloNIDine  HCL (CATAPRES ) 0.1 MG tablet Take 1 tablet (0.1 mg total) by mouth nightly.      dicyclomine (BENTYL) 20 mg tablet Take 1 tablet (20 mg total) by mouth two (2) times a day.      dolutegravir  (TIVICAY ) 50 mg TABLET Take 1 tablet (50 mg total) by mouth daily. 90 tablet 3    doravirine  (PIFELTRO ) 100 mg Tab Take 1 tablet (100 mg total) by mouth daily. 90 tablet 0    dorzolamide -timoloL  (COSOPT ) 22.3-6.8 mg/mL ophthalmic solution Administer 1 drop to both eyes two (2) times a day.      erythromycin (ROMYCIN) 5 mg/gram (0.5 %) ophthalmic ointment APPLY THIN LAYER IN RIGHT EYE AT BEDTIME      ferrous sulfate 325 (65 FE) MG tablet Take 1 tablet (325 mg total) by mouth in the morning. (Patient not taking: Reported on 12/13/2023)      folic acid (FOLVITE) 1 MG tablet  (Patient not taking: Reported on 12/13/2023)  0    glimepiride  (AMARYL ) 4 MG tablet Take 1 tablet (4 mg total) by mouth in the morning. 90 tablet 3    hydrALAZINE  (APRESOLINE ) 100 MG tablet Take 1 tablet (100 mg total) by mouth two (2) times a day.      lisinopril  (PRINIVIL ,ZESTRIL ) 40 MG tablet Take 1 tablet (40 mg total) by mouth daily.  90 tablet 3    nitroglycerin  (NITROSTAT ) 0.4 MG SL tablet Place 1 tablet (0.4 mg total) under the tongue every five (5) minutes as needed for chest pain. Maximum of 3 doses in 15 minutes. 25 tablet 0    ofloxacin (OCUFLOX) 0.3 % ophthalmic solution Administer 1 drop into the left eye four (4) times a day.      prednisoLONE acetate (PRED FORTE) 1 % ophthalmic suspension Administer 1 drop into the left eye four (4) times a day.      ROCKLATAN 0.02-0.005 % Drop INT 1 GTT INTO OU QHS      semaglutide (OZEMPIC) 1 mg/dose (2 mg/1.5 mL) PnIj Inject 1 mg under the skin every seven (7) days.      sertraline (ZOLOFT) 50 MG tablet take 1 tablet by mouth once daily      tamsulosin  (FLOMAX ) 0.4 mg capsule Take 1 capsule (0.4 mg total) by mouth before bedtime.      timolol  (TIMOPTIC ) 0.5 % ophthalmic solution INSTILL 1 DROP IN BOTH EYES EVERY MORNING      torsemide  (DEMADEX ) 10 MG tablet Take 2 tablets (20 mg total) by mouth daily.       No current facility-administered medications for this visit.   [2]   Allergies  Allergen Reactions    Colchicine Analogues Diarrhea     Have diarrhea when taken for long periods of time    Tramadol Other (See Comments)     unknown tolerates morphine

## 2024-02-23 NOTE — Unmapped (Signed)
 Medication Requested: doravirine  (PIFELTRO ) 100 mg Tab       Future Appointments   Date Time Provider Department Center   05/04/2024  1:30 PM Hurt, Lonni Schlossman, MD [facility]   06/05/2024 10:05 AM Hladik, Elna Dawn, MD [facility]     Per Provider Note: Current regimen:   dolutegravir  and doravirine     Standing order protocol requirements met?: Yes    Sent to: Pharmacy per protocol    Days Supply Given: 90 days  Number of Refills: 0

## 2024-02-28 MED FILL — TIVICAY 50 MG TABLET: ORAL | 30 days supply | Qty: 30 | Fill #1

## 2024-03-16 ENCOUNTER — Ambulatory Visit
Admission: RE | Admit: 2024-03-16 | Discharge: 2024-03-16 | Disposition: A | Discharge status: Home / Self Care | Attending: Gastroenterology | Admitting: Gastroenterology

## 2024-03-16 ENCOUNTER — Encounter: Payer: Self-pay | Admitting: Gastroenterology

## 2024-03-16 ENCOUNTER — Ambulatory Visit: Admitting: Anesthesiology

## 2024-03-16 ENCOUNTER — Encounter: Admission: RE | Disposition: A | Payer: Self-pay | Attending: Gastroenterology

## 2024-03-16 DIAGNOSIS — I130 Hypertensive heart and chronic kidney disease with heart failure and stage 1 through stage 4 chronic kidney disease, or unspecified chronic kidney disease: Secondary | ICD-10-CM | POA: Diagnosis not present

## 2024-03-16 DIAGNOSIS — R195 Other fecal abnormalities: Secondary | ICD-10-CM | POA: Diagnosis present

## 2024-03-16 DIAGNOSIS — I2510 Atherosclerotic heart disease of native coronary artery without angina pectoris: Secondary | ICD-10-CM | POA: Insufficient documentation

## 2024-03-16 DIAGNOSIS — Z87891 Personal history of nicotine dependence: Secondary | ICD-10-CM | POA: Insufficient documentation

## 2024-03-16 DIAGNOSIS — Z833 Family history of diabetes mellitus: Secondary | ICD-10-CM | POA: Diagnosis not present

## 2024-03-16 DIAGNOSIS — D125 Benign neoplasm of sigmoid colon: Secondary | ICD-10-CM | POA: Diagnosis not present

## 2024-03-16 DIAGNOSIS — Z6829 Body mass index (BMI) 29.0-29.9, adult: Secondary | ICD-10-CM | POA: Diagnosis not present

## 2024-03-16 DIAGNOSIS — K648 Other hemorrhoids: Secondary | ICD-10-CM | POA: Insufficient documentation

## 2024-03-16 DIAGNOSIS — E66813 Obesity, class 3: Secondary | ICD-10-CM | POA: Diagnosis not present

## 2024-03-16 DIAGNOSIS — D122 Benign neoplasm of ascending colon: Secondary | ICD-10-CM | POA: Diagnosis not present

## 2024-03-16 DIAGNOSIS — I252 Old myocardial infarction: Secondary | ICD-10-CM | POA: Diagnosis not present

## 2024-03-16 DIAGNOSIS — N183 Chronic kidney disease, stage 3 unspecified: Secondary | ICD-10-CM | POA: Diagnosis not present

## 2024-03-16 DIAGNOSIS — Z8249 Family history of ischemic heart disease and other diseases of the circulatory system: Secondary | ICD-10-CM | POA: Insufficient documentation

## 2024-03-16 DIAGNOSIS — I509 Heart failure, unspecified: Secondary | ICD-10-CM | POA: Diagnosis not present

## 2024-03-16 DIAGNOSIS — K219 Gastro-esophageal reflux disease without esophagitis: Secondary | ICD-10-CM | POA: Insufficient documentation

## 2024-03-16 DIAGNOSIS — Z7984 Long term (current) use of oral hypoglycemic drugs: Secondary | ICD-10-CM | POA: Insufficient documentation

## 2024-03-16 DIAGNOSIS — D649 Anemia, unspecified: Secondary | ICD-10-CM | POA: Diagnosis not present

## 2024-03-16 DIAGNOSIS — Z1211 Encounter for screening for malignant neoplasm of colon: Secondary | ICD-10-CM | POA: Insufficient documentation

## 2024-03-16 DIAGNOSIS — E1122 Type 2 diabetes mellitus with diabetic chronic kidney disease: Secondary | ICD-10-CM | POA: Insufficient documentation

## 2024-03-16 DIAGNOSIS — K5900 Constipation, unspecified: Secondary | ICD-10-CM | POA: Diagnosis not present

## 2024-03-16 DIAGNOSIS — K7460 Unspecified cirrhosis of liver: Secondary | ICD-10-CM | POA: Diagnosis not present

## 2024-03-16 DIAGNOSIS — B182 Chronic viral hepatitis C: Secondary | ICD-10-CM | POA: Diagnosis not present

## 2024-03-16 DIAGNOSIS — Z79899 Other long term (current) drug therapy: Secondary | ICD-10-CM | POA: Diagnosis not present

## 2024-03-16 DIAGNOSIS — F32A Depression, unspecified: Secondary | ICD-10-CM | POA: Insufficient documentation

## 2024-03-16 HISTORY — PX: POLYPECTOMY: SHX149

## 2024-03-16 HISTORY — PX: COLONOSCOPY: SHX5424

## 2024-03-16 LAB — GLUCOSE, CAPILLARY: Glucose-Capillary: 107 mg/dL — ABNORMAL HIGH (ref 70–99)

## 2024-03-16 SURGERY — COLONOSCOPY
Anesthesia: General

## 2024-03-16 MED ORDER — SODIUM CHLORIDE 0.9 % IV SOLN: INTRAVENOUS | Status: DC

## 2024-03-16 MED ORDER — PROPOFOL 500 MG/50ML IV EMUL
INTRAVENOUS | Status: DC | PRN
  Administered 2024-03-16: 140 ug/kg/min via INTRAVENOUS

## 2024-03-16 MED ORDER — PROPOFOL 10 MG/ML IV BOLUS
INTRAVENOUS | Status: DC | PRN
  Administered 2024-03-16: 70 mg via INTRAVENOUS

## 2024-03-16 NOTE — Anesthesia Postprocedure Evaluation (Signed)
 Anesthesia Post Note  Patient: Jared Walker  Procedure(s) Performed: COLONOSCOPY POLYPECTOMY, INTESTINE  Patient location during evaluation: PACU Anesthesia Type: General Level of consciousness: awake and alert Pain management: satisfactory to patient Vital Signs Assessment: post-procedure vital signs reviewed and stable Respiratory status: spontaneous breathing Cardiovascular status: stable Anesthetic complications: no   No notable events documented.   Last Vitals:  Vitals:   03/16/24 0842 03/16/24 0851  BP: (!) 143/93 (!) 154/96  Pulse: 70 64  Resp: 20 19  Temp:    SpO2: 97% 97%    Last Pain:  Vitals:   03/16/24 0851  TempSrc:   PainSc: 0-No pain                 VAN STAVEREN,Shaylinn Hladik

## 2024-03-16 NOTE — Anesthesia Preprocedure Evaluation (Addendum)
 Anesthesia Evaluation  Patient identified by MRN, date of birth, ID band Patient awake    Reviewed: Allergy & Precautions, NPO status , Patient's Chart, lab work & pertinent test results  Airway Mallampati: II  TM Distance: >3 FB Neck ROM: full    Dental  (+) Edentulous Upper, Edentulous Lower   Pulmonary Patient abstained from smoking., former smoker   Pulmonary exam normal        Cardiovascular Exercise Tolerance: Poor hypertension, Pt. on medications + CAD and + Past MI  Normal cardiovascular exam Rhythm:Regular Rate:Normal     Neuro/Psych    Depression    TIAnegative neurological ROS  negative psych ROS   GI/Hepatic negative GI ROS,GERD  Medicated,,(+) Cirrhosis       , Hepatitis -, C  Endo/Other  diabetes, Type 2, Oral Hypoglycemic Agents  Class 3 obesity  Renal/GU CRFRenal disease  negative genitourinary   Musculoskeletal   Abdominal  (+) + obese  Peds  Hematology negative hematology ROS (+) Blood dyscrasia, anemia   Anesthesia Other Findings Past Medical History: No date: Anemia No date: Aortic stenosis, mild     Comment:  mild AS by echo, 08/2011 No date: Cervical disc herniation No date: CHF (congestive heart failure) (HCC) No date: Chronic kidney disease     Comment:  kidney stones. chronic kidney disease, stage III.               followed by doctor in [place] No date: Cirrhosis [facility])     Comment:  due to hepatitis c not being treated for a long periiod               of time No date: Coronary artery disease No date: Depression 06/2017: Diabetes mellitus without complication (HCC)     Comment:  A1C 10.0 No date: Full dentures     Comment:  upper and lower No date: GERD (gastroesophageal reflux disease) No date: Gout No date: Heart murmur     Comment:  no treatment 01/19/2015: Hep C w/o coma, chronic (HCC)     Comment:  took treatment and it is now gone.  09/25/11: Hepatitis  C 09/25/2011: HIV positive (HCC)     Comment:  nondetectable.  No date: Hypertension 2017: MI (myocardial infarction) (HCC)     Comment:  NSTEMI No date: Neuromuscular disorder (HCC)     Comment:  numbness in fingers most likely due to shoulder damage.  Past Surgical History: 1985: BACK SURGERY     Comment:  lumbar laminectomy. metal in pelvis 1990: bulging disc; Bilateral     Comment:  L4-L5 No date: CARDIOVASCULAR STRESS TEST     Comment:  at [facility] No date: CHOLECYSTECTOMY No date: CLOSED REDUCTION PELVIC FRACTURE 01/21/2015: ESOPHAGOGASTRODUODENOSCOPY (EGD) WITH PROPOFOL ; N/A     Comment:  Procedure: ESOPHAGOGASTRODUODENOSCOPY (EGD) WITH               PROPOFOL ;  Surgeon: Lamar ONEIDA Holmes, MD;  Location: [facility]              ENDOSCOPY;  Service: Endoscopy;  Laterality: N/A; 09/04/2015: ESOPHAGOGASTRODUODENOSCOPY (EGD) WITH PROPOFOL ; N/A     Comment:  Procedure: ESOPHAGOGASTRODUODENOSCOPY (EGD) WITH               PROPOFOL ;  Surgeon: Lamar ONEIDA Holmes, MD;  Location: [facility]              ENDOSCOPY;  Service: Endoscopy;  Laterality: N/A; No date: FRACTURE SURGERY; Right     Comment:  hand.  pins in hand from long ago 2009: KNEE ARTHROSCOPY     Comment:  Right 09/25/2011: LUMBAR LAMINECTOMY/DECOMPRESSION MICRODISCECTOMY     Comment:  Procedure: LUMBAR LAMINECTOMY/DECOMPRESSION               MICRODISCECTOMY;  Surgeon: Fairy Levels, MD;  Location:               [facility];  Service: Neurosurgery;  Laterality: Left;               Left Lumbar Four-Five Microdiskectomy No date: NEPHROSTOMY     Comment:  tube Left 10/12/2014: SHOULDER ARTHROSCOPY WITH OPEN ROTATOR CUFF REPAIR; Right     Comment:  Procedure: SHOULDER ARTHROSCOP with decompression;                Surgeon: Helayne Glenn, MD;  Location: [facility]               [facility];  Service: Orthopedics;  Laterality: Right; 07/08/2017: SHOULDER ARTHROSCOPY WITH OPEN ROTATOR CUFF REPAIR; Left     Comment:  Procedure: SHOULDER ARTHROSCOPY WITH  OPEN ROTATOR CUFF               REPAIR;  Surgeon: Edie Norleen PARAS, MD;  Location: [facility];              Service: Orthopedics;  Laterality: Left;  BMI    Body Mass Index: 29.43 kg/m      Reproductive/Obstetrics negative OB ROS                              Anesthesia Physical Anesthesia Plan  ASA: 3  Anesthesia Plan: General   Post-op Pain Management:    Induction: Intravenous  PONV Risk Score and Plan: Propofol  infusion and TIVA  Airway Management Planned: Natural Airway and Nasal Cannula  Additional Equipment:   Intra-op Plan:   Post-operative Plan:   Informed Consent: I have reviewed the patients History and Physical, chart, labs and discussed the procedure including the risks, benefits and alternatives for the proposed anesthesia with the patient or authorized representative who has indicated his/her understanding and acceptance.     Dental Advisory Given  Plan Discussed with: CRNA  Anesthesia Plan Comments:          Anesthesia Quick Evaluation

## 2024-03-16 NOTE — H&P (Signed)
 Jared Walker , MD [address], [place], [place], [address] Phone: [phone] Fax: [phone]  Primary Care Physician:  Jared Manna, MD   Pre-Procedure History & Physical: HPI:  Jared Walker is a 73 y.o. male is here for an colonoscopy.   Past Medical History:  Diagnosis Date   Anemia    Aortic stenosis, mild    mild AS by echo, 08/2011   Cervical disc herniation    CHF (congestive heart failure) (HCC)    Chronic kidney disease    kidney stones. chronic kidney disease, stage III. followed by doctor in [place]   Cirrhosis [facility])    due to hepatitis c not being treated for a long periiod of time   Coronary artery disease    Depression    Diabetes mellitus without complication (HCC) 06/2017   A1C 10.0   Full dentures    upper and lower   GERD (gastroesophageal reflux disease)    Gout    Heart murmur    no treatment   Hep C w/o coma, chronic (HCC) 01/19/2015   took treatment and it is now gone.    Hepatitis C 09/25/11   HIV positive (HCC) 09/25/2011   nondetectable.    Hypertension    MI (myocardial infarction) (HCC) 2017   NSTEMI   Neuromuscular disorder (HCC)    numbness in fingers most likely due to shoulder damage.    Past Surgical History:  Procedure Laterality Date   BACK SURGERY  1985   lumbar laminectomy. metal in pelvis   bulging disc Bilateral 1990   L4-L5   CARDIOVASCULAR STRESS TEST     at [facility]   CHOLECYSTECTOMY     CLOSED REDUCTION PELVIC FRACTURE     ESOPHAGOGASTRODUODENOSCOPY (EGD) WITH PROPOFOL  N/A 01/21/2015   Procedure: ESOPHAGOGASTRODUODENOSCOPY (EGD) WITH PROPOFOL ;  Surgeon: Jared ONEIDA Holmes, MD;  Location: [facility] ENDOSCOPY;  Service: Endoscopy;  Laterality: N/A;   ESOPHAGOGASTRODUODENOSCOPY (EGD) WITH PROPOFOL  N/A 09/04/2015   Procedure: ESOPHAGOGASTRODUODENOSCOPY (EGD) WITH PROPOFOL ;  Surgeon:  Jared ONEIDA Holmes, MD;  Location: [facility] ENDOSCOPY;  Service: Endoscopy;  Laterality: N/A;   FRACTURE SURGERY Right    hand.  pins in hand from long ago   KNEE ARTHROSCOPY  2009   Right   LUMBAR LAMINECTOMY/DECOMPRESSION MICRODISCECTOMY  09/25/2011   Procedure: LUMBAR LAMINECTOMY/DECOMPRESSION MICRODISCECTOMY;  Surgeon: Jared Levels, MD;  Location: [facility] NEURO ORS;  Service: Neurosurgery;  Laterality: Left;  Left Lumbar Four-Five Microdiskectomy   NEPHROSTOMY     tube Left   SHOULDER ARTHROSCOPY WITH OPEN ROTATOR CUFF REPAIR Right 10/12/2014   Procedure: SHOULDER ARTHROSCOP with decompression;  Surgeon: Helayne Glenn, MD;  Location: [facility];  Service: Orthopedics;  Laterality: Right;   SHOULDER ARTHROSCOPY WITH OPEN ROTATOR CUFF REPAIR Left 07/08/2017   Procedure: SHOULDER ARTHROSCOPY WITH OPEN ROTATOR CUFF REPAIR;  Surgeon: Jared Norleen JINNY, MD;  Location: [facility] ORS;  Service: Orthopedics;  Laterality: Left;    Prior to Admission medications   Medication Sig Start Date End Date Taking? Authorizing Provider  allopurinol  (ZYLOPRIM ) 100 MG tablet Take 300 mg by mouth daily.    Yes [provider]  amLODipine (NORVASC) 5 MG tablet Take 5 mg by mouth daily.  11/13/15 08/02/37 Yes [provider]  atorvastatin (LIPITOR) 80 MG tablet Take 80 mg by mouth daily. Take in the morning. 11/13/15 08/02/37 Yes [provider]  carvedilol  (COREG ) 12.5 MG tablet Take 12.5 mg by mouth 2 (two) times daily with a meal.  Yes [provider]  cloNIDine  (CATAPRES ) 0.1 MG tablet Take 0.1 mg by mouth 3 (three) times daily.   Yes [provider]  DESCOVY  200-25 MG tablet Take 1 tablet by mouth at bedtime.  08/12/15  Yes [provider]  dolutegravir  (TIVICAY ) 50 MG tablet Take 50 mg by mouth at bedtime.  07/31/15  Yes [provider]  glimepiride (AMARYL) 2 MG tablet Take 4 mg by mouth daily with breakfast.    Yes [provider]  JANUVIA 50 MG tablet  Take 50 mg by mouth daily. 10/25/19  Yes [provider]  sertraline  (ZOLOFT ) 50 MG tablet Take 50 mg by mouth every evening.    Yes [provider]  albuterol  (PROVENTIL  HFA;VENTOLIN  HFA) 108 (90 Base) MCG/ACT inhaler Inhale 1-2 puffs into the lungs every 6 (six) hours as needed for wheezing or shortness of breath.  08/12/16   [provider]  aspirin  EC 81 MG EC tablet Take 1 tablet (81 mg total) by mouth daily. 11/11/15   Jared Cea, MD  Cholecalciferol  (VITAMIN D -1000 MAX ST) 1000 units tablet Take 1,000 Units by mouth daily.    [provider]  Cranberry 400 MG CAPS Take 400 mg by mouth daily.    [provider]  diazepam  (VALIUM ) 5 MG tablet Take 5 mg by mouth at bedtime.     [provider]  donepezil (ARICEPT) 10 MG tablet Take 10 mg by mouth at bedtime.  Patient not taking: Reported on 11/13/2019 05/02/18 05/18/23  [provider]  ferrous sulfate  325 (65 FE) MG tablet Take 325 mg by mouth daily with breakfast.  01/10/15 08/02/37  [provider]  folic acid  (FOLVITE ) 1 MG tablet Take 1 mg by mouth daily. In the morning    [provider]  HYDROcodone -acetaminophen  (NORCO/VICODIN) 5-325 MG tablet Directions are take one tablet 30 minutes before physical therapy, then take one tablet at bedtime as needed for pain 02/20/16   [provider]  losartan  (COZAAR ) 100 MG tablet Take 100 mg by mouth every morning.  10/31/15   [provider]  naproxen  (NAPROSYN ) 500 MG tablet Take 1 tablet (500 mg total) by mouth 2 (two) times daily with a meal. 04/07/20   Jared Charleston, MD  nystatin  (MYCOSTATIN ) 100000 UNIT/ML suspension Take 5 mLs by mouth 4 (four) times daily as needed for mouth pain. 10/08/15   [provider]  Omega-3 Fatty Acids (FISH OIL PO) Take by mouth.    [provider]  ondansetron  (ZOFRAN  ODT) 4 MG disintegrating tablet Take 1 tablet (4 mg total) by mouth every 8 (eight) hours as  needed. 04/07/20   Jared Charleston, MD  ondansetron  (ZOFRAN ) 4 MG tablet TAKE 1 TABLET BY MOUTH EVERY 8 HOURS IF NEEDED FOR NAUSEA 06/03/17   [provider]  pantoprazole  (PROTONIX ) 40 MG tablet Take 1 tablet by mouth daily. 08/12/17   [provider]  tamsulosin (FLOMAX) 0.4 MG CAPS capsule Take 1 capsule by mouth daily. 12/22/17   [provider]  torsemide  (DEMADEX ) 20 MG tablet Take 1 tablet (20 mg total) by mouth daily. 03/28/16   Vaickute, Rima, MD    Allergies as of 03/11/2024 - Review Complete 10/13/2023  Allergen Reaction Noted   Oxycodone -acetaminophen  Other (See Comments) 01/18/2015   Buchu-cornsilk-ch grass-hydran Other (See Comments) 01/18/2015   Colchicine Diarrhea and Other (See Comments) 01/18/2015   Nifedipine Other (See Comments) 01/18/2015   Tramadol Other (See Comments) 01/18/2015    Family History  Problem Relation Age of Onset   Hypertension Mother    Cancer Father    Heart disease Sister    Diabetes Brother    Anesthesia problems Neg Hx     Social History   Socioeconomic History   Marital status: Married    Spouse name: Not on file   Number of children: Not on file   Years of education: Not on file   Highest education level: Not on file  Occupational History   Not on file  Tobacco Use   Smoking status: Former    Current packs/day: 0.00    Types: Cigarettes    Quit date: 06/1978    Years since quitting: 45.8   Smokeless tobacco: Former    Quit date: 03/13/1972  Vaping Use   Vaping status: Never Used  Substance and Sexual Activity   Alcohol use: No   Drug use: No   Sexual activity: Not on file  Other Topics Concern   Not on file  Social History Narrative   Independent at baseline. Ambulates with a cane. Lives at home with his wife   Social Drivers of Corporate investment banker Strain: Low Risk  (01/24/2024)   Received from [facility]   Overall Financial Resource Strain (CARDIA)    Difficulty of  Paying Living Expenses: Not hard at all  Food Insecurity: No Food Insecurity (01/24/2024)   Received from [facility]   Hunger Vital Sign    Within the past 12 months, you worried that your food would run out before you got the money to buy more.: Never true    Within the past 12 months, the food you bought just didn't last and you didn't have money to get more.: Never true  Transportation Needs: No Transportation Needs (01/24/2024)   Received from [facility] - Transportation    In the past 12 months, has lack of transportation kept you from medical appointments or from getting medications?: No    Lack of Transportation (Non-Medical): No  Physical Activity: Inactive (09/20/2019)   Received from [facility]   Exercise Vital Sign    On average, how many days per week do you engage in moderate to strenuous exercise (like a brisk walk)?: 0 days    On average, how many minutes do you engage in exercise at this level?: 0 min  Stress: Stress Concern Present (09/20/2019)   Received from [facility] - Occupational Stress Questionnaire    Feeling of Stress : To some extent  Social Connections: Moderately Isolated (09/20/2019)   Received from [facility]   Social Connection and Isolation Panel    In a typical week, how many times do you talk on the phone with family, friends, or neighbors?: More than three times a week    How often do you get together with friends or relatives?: More than three times a week    How often do you attend church or religious services?: Never    Do you belong to any clubs or organizations such as church groups, unions, fraternal or athletic groups, or school groups?: No    How often do you attend meetings of the clubs or organizations you belong to?: Never    Are you married, widowed, divorced, separated, never married, or living with  a partner?: Married  Intimate Partner Violence: Patient Unable To Answer (09/03/2023)   Received  from [facility]   Humiliation, Afraid, Rape, and Kick questionnaire    Within the last year, have you been afraid of your partner or ex-partner?: Patient unable to answer    Within the last year, have you been humiliated or emotionally abused in other ways by your partner or ex-partner?: Patient unable to answer    Within the last year, have you been kicked, hit, slapped, or otherwise physically hurt by your partner or ex-partner?: Patient unable to answer    Within the last year, have you been raped or forced to have any kind of sexual activity by your partner or ex-partner?: Patient unable to answer    Review of Systems: See HPI, otherwise negative ROS  Physical Exam: BP (!) 195/108   Pulse 65   Temp (!) 97 F (36.1 C) (Temporal)   Resp 16   Ht 5' 11 (1.803 m)   Wt 95.7 kg   SpO2 98%   BMI 29.43 kg/m  General:   Alert,  pleasant and cooperative in NAD Head:  Normocephalic and atraumatic. Neck:  Supple; no masses or thyromegaly. Lungs:  Clear throughout to auscultation, normal respiratory effort.    Heart:  +S1, +S2, Regular rate and rhythm, No edema. Abdomen:  Soft, nontender and nondistended. Normal bowel sounds, without guarding, and without rebound.   Neurologic:  Alert and  oriented x4;  grossly normal neurologically.  Impression/Plan: Kaire J Mahany is here for an colonoscopy to be performed for positive cologuard Risks, benefits, limitations, and alternatives regarding  colonoscopy have been reviewed with the patient.  Questions have been answered.  All parties agreeable.   Jared Kung, MD  03/16/2024, 7:47 AM

## 2024-03-16 NOTE — Transfer of Care (Signed)
 Immediate Anesthesia Transfer of Care Note  Patient: Jared Walker  Procedure(s) Performed: COLONOSCOPY POLYPECTOMY, INTESTINE  Patient Location: PACU  Anesthesia Type:General  Level of Consciousness: awake, alert , and oriented  Airway & Oxygen Therapy: Patient Spontanous Breathing  Post-op Assessment: Report given to RN and Post -op Vital signs reviewed and stable  Post vital signs: Reviewed and stable  Last Vitals:  Vitals Value Taken Time  BP 114/80 03/16/24 08:31  Temp 36.5 C 03/16/24 08:31  Pulse 74 03/16/24 08:31  Resp 13 03/16/24 08:31  SpO2 100 % 03/16/24 08:31    Last Pain:  Vitals:   03/16/24 0831  TempSrc: Tympanic  PainSc: Asleep         Complications: No notable events documented.

## 2024-03-16 NOTE — Op Note (Addendum)
 [facility] Gastroenterology Patient Name: Jared Walker Procedure Date: 03/16/2024 8:08 AM MRN: [id number] Account #: [id number] Date of Birth: Jan 16, 1951 Admit Type: Outpatient Age: 73 Room: [facility] ENDO ROOM 3 Gender: Male Note Status: Supervisor Override Instrument Name: Colon Scope [id number] Procedure:             Colonoscopy Indications:           Positive Cologuard test, Constipation Providers:             [name] MD, MD Referring MD:          [name] MD, MD (Referring MD) Medicines:             Monitored Anesthesia Care Complications:         No immediate complications. Procedure:             Pre-Anesthesia Assessment:                        - Prior to the procedure, a History and Physical was                         performed, and patient medications, allergies and                         sensitivities were reviewed. The patient's tolerance                         of previous anesthesia was reviewed.                        - The risks and benefits of the procedure and the                         sedation options and risks were discussed with the                         patient. All questions were answered and informed                         consent was obtained.                        - ASA Grade Assessment: II - A patient with mild                         systemic disease.                        After obtaining informed consent, the colonoscope was                         passed under direct vision. Throughout the procedure,                         the patient's blood pressure, pulse, and oxygen                         saturations were monitored continuously. The                         Colonoscope  was introduced through the anus and                         advanced to the the cecum, identified by the                         appendiceal orifice. The colonoscopy was performed                         with ease. The patient tolerated the procedure  well.                         The quality of the bowel preparation was adequate. The                         ileocecal valve, appendiceal orifice, and rectum were                         photographed. Findings:      The perianal and digital rectal examinations were normal.      Internal hemorrhoids were found during retroflexion. The hemorrhoids       were medium-sized.      Four sessile polyps were found in the ascending colon. The polyps were 5       to 6 mm in size. These polyps were removed with a cold snare. Resection       and retrieval were complete.      A 5 mm polyp was found in the sigmoid colon. The polyp was sessile. The       polyp was removed with a cold snare. Resection and retrieval were       complete.      The exam was otherwise without abnormality on direct and retroflexion       views. Impression:            - Internal hemorrhoids.                        - Four 5 to 6 mm polyps in the ascending colon,                         removed with a cold snare. Resected and retrieved.                        - One 5 mm polyp in the sigmoid colon, removed with a                         cold snare. Resected and retrieved.                        - The examination was otherwise normal on direct and                         retroflexion views. Recommendation:        - Discharge patient to home (with escort).                        - Resume previous diet.                        -  Continue present medications.                        - Await pathology results.                        - Repeat colonoscopy in 3 years for surveillance based                         on pathology results. Procedure Code(s):     --- Professional ---                        [phone], Colonoscopy, flexible; with removal of                         tumor(s), polyp(s), or other lesion(s) by snare                         technique Diagnosis Code(s):     --- Professional ---                        D12.2, Benign  neoplasm of ascending colon                        D12.5, Benign neoplasm of sigmoid colon                        K64.8, Other hemorrhoids                        K59.00, Constipation, unspecified CPT copyright 2022 American Medical Association. All rights reserved. The codes documented in this report are preliminary and upon coder review may  be revised to meet current compliance requirements. [name], MD [name] MD, MD 03/16/2024 8:31:57 AM This report has been signed electronically. Number of Addenda: 0 Note Initiated On: 03/16/2024 8:08 AM Scope Withdrawal Time: 0 hours 10 minutes 33 seconds  Total Procedure Duration: 0 hours 12 minutes 49 seconds  Estimated Blood Loss:  Estimated blood loss: none.      [facility]

## 2024-03-17 LAB — SURGICAL PATHOLOGY

## 2024-03-23 NOTE — Unmapped (Signed)
 [facility] Specialty and Home Delivery Pharmacy Refill Coordination Note    Specialty Medication(s) to be Shipped:   Infectious Disease: Pifeltro  and Tivicay     Other medication(s) to be shipped: No additional medications requested for fill at this time    Specialty Medications not needed at this time: N/A     Samuel [name], DOB: Feb 29, 1951  Phone: [phone] (home)       All above HIPAA information was verified with patient's family member, wife.     Was a Nurse, learning disability used for this call? No    Completed refill call assessment today to schedule patient's medication shipment from the [facility] and Home Delivery Pharmacy  [phone]).  All relevant notes have been reviewed.     Specialty medication(s) and dose(s) confirmed: Regimen is correct and unchanged.   Changes to medications: Tara reports no changes at this time.  Changes to insurance: No  New side effects reported not previously addressed with a pharmacist or physician: None reported  Questions for the pharmacist: No    Confirmed patient received a Conservation officer, historic buildings and a Surveyor, mining with first shipment. The patient will receive a drug information handout for each medication shipped and additional FDA Medication Guides as required.       DISEASE/MEDICATION-SPECIFIC INFORMATION        N/A    SPECIALTY MEDICATION ADHERENCE     Medication Adherence    Patient reported X missed doses in the last month: 0  Specialty Medication: PIFELTRO  100 mg Tab (doravirine )  Patient is on additional specialty medications: Yes  Additional Specialty Medications: TIVICAY  50 mg TABLET (dolutegravir )  Patient Reported Additional Medication X Missed Doses in the Last Month: 0              Were doses missed due to medication being on hold? No     dolutegravir : TIVICAY  50 mg TABLET: 5 days of medicine on hand    doravirine : PIFELTRO  100 mg Tab: 5 days of medicine on hand       REFERRAL TO PHARMACIST     Referral to the pharmacist: Not needed      [facility] Shipping address confirmed in Epic.     Cost and Payment: Patient has a $0 copay, payment information is not required.    Delivery Scheduled: Yes, Expected medication delivery date: 03/24/24.     Medication will be delivered via Same Day Courier to the prescription address in Epic WAM.    Dena LOISE Dolores   [facility] Specialty and Home Delivery Pharmacy  Specialty Technician

## 2024-03-24 MED FILL — TIVICAY 50 MG TABLET: ORAL | 30 days supply | Qty: 30 | Fill #2

## 2024-03-24 MED FILL — PIFELTRO 100 MG TABLET: ORAL | 30 days supply | Qty: 30 | Fill #1

## 2024-04-17 NOTE — Progress Notes (Signed)
 [facility] Specialty and Home Delivery Pharmacy Refill Coordination Note    Specialty Medication(s) to be Shipped:   Infectious Disease: Pifeltro  and Tivicay     Other medication(s) to be shipped: No additional medications requested for fill at this time    Specialty Medications not needed at this time: N/A     Samuel [name], DOB: December 22, 1950  Phone: [phone] (home)       All above HIPAA information was verified with patient.     Was a nurse, learning disability used for this call? No    Completed refill call assessment today to schedule patient's medication shipment from the [facility] and Home Delivery Pharmacy  [phone]).  All relevant notes have been reviewed.     Specialty medication(s) and dose(s) confirmed: Regimen is correct and unchanged.   Changes to medications: [name] reports no changes at this time.  Changes to insurance: No  New side effects reported not previously addressed with a pharmacist or physician: None reported  Questions for the pharmacist: No    Confirmed patient received a Conservation Officer, Historic Buildings and a Surveyor, Mining with first shipment. The patient will receive a drug information handout for each medication shipped and additional FDA Medication Guides as required.       DISEASE/MEDICATION-SPECIFIC INFORMATION        N/A    SPECIALTY MEDICATION ADHERENCE     Medication Adherence    Patient reported X missed doses in the last month: 0  Specialty Medication: PIFELTRO  100 mg Tab (doravirine )  Patient is on additional specialty medications: Yes  Additional Specialty Medications: TIVICAY  50 mg TABLET (dolutegravir )  Patient Reported Additional Medication X Missed Doses in the Last Month: 0  Patient is on more than two specialty medications: No  Any gaps in refill history greater than 2 weeks in the last 3 months: no  Demonstrates understanding of importance of adherence: yes  Informant: patient  Confirmed plan for next specialty medication refill: delivery by pharmacy  Refills needed for supportive medications: not needed          Refill Coordination    Has the Patients' Contact Information Changed: No  Is the Shipping Address Different: No         Were doses missed due to medication being on hold? No    doravirine : PIFELTRO  100 mg: 7 days of medicine on hand   dolutegravir : TIVICAY  50 mg: 7 days of medicine on hand       REFERRAL TO PHARMACIST     Referral to the pharmacist: Not needed      [facility]     Shipping address confirmed in Epic.     Cost and Payment: Patient has a $0 copay, payment information is not required.    Delivery Scheduled: Yes, Expected medication delivery date: 04/20/24.     Medication will be delivered via Next Day Courier to the prescription address in Epic WAM.    Suzen Blood   [facility] Specialty and Home Delivery Pharmacy  Specialty Technician

## 2024-04-19 MED FILL — PIFELTRO 100 MG TABLET: ORAL | 30 days supply | Qty: 30 | Fill #2

## 2024-04-19 MED FILL — TIVICAY 50 MG TABLET: ORAL | 30 days supply | Qty: 30 | Fill #3

## 2024-05-03 NOTE — Progress Notes (Unsigned)
 [facility]  [address]  [place], [place]  [address]  P [phone]  F [phone]     PCP:    [name], MD      Care team:  Raphael Carrier, MD (Cardiology)     Pricilla Elna Dawn, MD (Nephrology)     Charlanne Bushy, MD (Ophtho @ [facility])    Last visit with me:  10/2023 (video visit)      Assessment/Plan:      HIV (dx'd 12/1999, nadir CD4 239 / 30% in 06/2011)  - chronic, stable    Current regimen:   dolutegravir  and doravirine     ARV hx  Atripla (first regimen; rx'd 09/2007) - ~2010 - 07/2015  Descovy  + Tivicay  (d/t CKD) - 07/2015 - 12/2020  ABC 600/d + 3TC 100/d + DTG 50/d    - 12/2020 - 01/2021   (I didn't feel right - mostly GI symptoms)  Descovy  + Tivicay  - 01/2021 - 04/2022  Tivicay  + Pifeltro  - 04/2022 - present    Med access via Medicare.       Absolute CD4 Count   Date Value Ref Range Status   05/04/2023 782 510 - 2,320 /uL Final     CD4% (T Helper)   Date Value Ref Range Status   05/04/2023 34 34 - 58 % Final     HIV RNA Quant Result   Date Value Ref Range Status   05/04/2023 Detected (A) Not Detected Final     HIV RNA   Date Value Ref Range Status   05/04/2023 <20 (H) <0 copies/mL Final        Resistance hx  06/2002 - PR: 71T  01/2005 - PR: 41K, 63P, 71T -- RT: 196E, 214F  09/2007 - PR: 93P, 63S, 71T -- RT: 333E  07/2008 - PR: 63P, 63S, 71T -- RT: 333E  05/2023 - cumulative above with no significant resistance - [facility] HIVdb permanent link       03/2022 - Doing OK overall - tolerated the switch back to Descovy  from split ABC + 3TC w/o difficulty.   07/2022 - He tolerated the switch to Pifeltro  well - no issues. Takes these at nighttime, before bedtime. Looked at potential issues w/doravirine  or dolutegravir  with iHD, and they both seem to be OK - though dosing after HD would probably be advisable.  05/2023 - labs drawn at Dr Orpah appt on 03 Dec - CD4 count stable and RNA detected but <20, indicating ongoing stability on NRTI-sparing regimen of DOR and DTG. No issues w/refills or adherence.  10/2023 - no issues w/getting Rxs filled on time  05/2024 - ***    CD4 count over 300 for >2Y on suppressive ART; no prophylaxis needed; recheck CD4 annually  Discussed ARV adherence  CD4, HIV RNA, and safety labs (full return panel)   Continue current therapy  Encouraged continued excellent ARV adherence      Ophthalmologic issues  Glaucoma s/p phacotrabeculectomy OU, c/b postop dehiscence and hyphema  - acute, complicated   Visual impairment, acute  - acute, complicated   10/2023 - Tristyn says could be better. Says the healing process hasn't been good. His wife says Dr Charlanne was the person for the eyes. His eyes are not healing very well. In fact he's losing his sight. Going back 6/24, not sure what's going to happen. Been dealing with this since January. April was the second surgery (on the other eye). I asked what the issue with healing was, exactly  has had some bleeding & redness. His wife wanted to make sure this didn't have anything to with his HIV - reassurance provided, based on his CD4 counts over time. He's having difficulty w/his phone bc he can't swipe to do tasks. They're looking at getting a shower chair. He has a cane for ambulation, but he's having difficulty with sussing out distance to steps, curbs. Provided some info re: keeping receipts for DME (e.g., shower chair, grab bars, etc.) for possible reimbursement. I reviewed records via [facility]Everywhere after I disconnected w/them - most recent encounter 10/19/2023 (Drs Cesario & Charlanne). Dxs were of primary open-angle glaucoma OU, severe/advanced at presentation, s/p cataract extraction and intraocular lens implant (CE/LOI) of L eye with trabeculectomy on 27Jan2025; had a mild trauma (granddaughter poked in left eye) prior to f/u on 07/13/2023. At that visit, vision in R eye was subjectively better. Had add'l f/u visits on 08/03/2023 and 08/31/2023 - left eye at that visit had still some fibrin on IOL. Underwent CE/IOL and trabeculectomy of RIGHT eye on 09/27/2023. Adherence to gtts was unclear after this, per f/us  on 5/6 and 5/13. Wound dehiscence noted on nasal margin of incision area on 10/12/2023 visit. By 5/20 had a mucus plug at the area of dehiscence and a 1.66mm layered hyphema. The hyphema was still present at 5/27 visit - and he had completed imaging showing multiple chronic infarcts (see below). I asked about things to potentially help improve his safety - and specifically inquired about pre-packaged med dose packs (ie, adherence packaging). They were unaware of this option but both felt like this would be of great value to them. I went to [facility] ([email or web address]) and searched for locations in [facility] that offer delivery and adherence packaging. There are two: [employer] and [employer]. I'm going to ask our clinic pharmD, Dr. Ranny, for some assistance in getting this set up for them. I think we'll need medication reconciliation first and then once we have a defined and accurate med list, we can coordinate with one of those two pharmacies to prepare dose-packs for Nivin.  05/2024 - At 12/2023 f/u with [facility], they had concerns for add'l processes and got labs for myasthenia, B12, and folate; there was also a plan to return to OR for reposition of the intraocular lens vs exchange on the RIGHT eye. ***  ***  Asking our pharmD, Dr Ranny, for help in dose/adherence pack preparation (see above).      Neurocognitive concerns - chronic, progressive   05/2023 - Had extended discussion with pt and his wife about concerns she expressed in visit re: lack of motivation, forgetfulness, and perceived changes in his attention/engagement. He feels the onset was around time of worsening of his vision, and describes not being able to do things that he enjoyed doing previously, bc of difficulty with visual acuity (e.g., texting affirmations and messages to loved ones; reading; driving). This has also introduced some add'l stress in form of caretaking responsibilities on his wife. Discussed pros/cons of baseline neurocognitive testing given high prevalence of neurocognitive dysfunction among people who have lived w/HIV for a long time (HAND). Here, DDx I think is fairly broad; I think depression or adjustment disorder would probably be highest, followed by med effects (diazepam , dicyclomine, pregabalin, ??-blockade), thyroid dz, possible early manifestations of ESRD, and HAND. Proposed a plan to get thru his eye procedures and see how things improve after this - then pursue add'l evals/workups after this, incrementally. Both he  and his wife said this sounded reasonable to them.  10/2023 - At a late May f/u visit post-op, Dr Charlanne was concerned about a facial droop, referred to PCP. Dr Sadie looked at him and routed him to the ED. They did a CT and an MRI. From this, they had found mini strokes, and a lot of them, but there were none active and they were old. He is very weak in his body. Dr Sadie did about 3 weeks of [PT] with him at the house.  They also did some HH-based medication reconciliation. We discussed that knowing Nas has had some chronic infarcts is helpful in some ways to explain some of the neurocog issues that he and his wife conveyed to me at 05/2023 visit.  05/2024 - ***  ***  Continue to monitor      CKD stage G4/A2, pre-transplant  - chronic, progressive     eGFR CKD-EPI (2021) Male   Date Value Ref Range Status   09/07/2023 24 (L) >=60 mL/min/1.23m2 Final     Comment:     eGFR calculated with CKD-EPI 2021 equation in accordance with [employer] and [employer] Task Force recommendations.     Creatinine   Date Value Ref Range Status   09/07/2023 2.77 (H) 0.73 - 1.18 mg/dL Final     Cystatin C   Date Value Ref Range Status   03/12/2022 2.26 (H) 0.64 - 1.23 mg/L Final       03/2022 - Initial transplant eval 07/2020. Most recent visit w/Hladik 08/2021. Per HPI, doing well in interim. BP 133/79. Slow progression. No indication for HD. He is to complete a colonoscopy as the final phase of his transplant evaluation. Plan = RTC 37m (planned for 03/26/2022). With respect to his ARVs, the problem is that now his eGFR is consistently under 30 mL/min, and use of fixed-dose Descovy  isn't recommended; the threshold for TAF by itself is 15 mL/min, (so it's OK) but for FTC it's 30 mL/min as the lower threshold before interval-spacing is required (for him, it'd be 200 mg every 72h). He's already having challenges keeping up w/his meds, so I'm concerned about making any changes at this time. I'm also a little concerned about possible influence of dolutegravir  on creatinine secretion here and it maybe spuriously making his actual eGFR look worse - so checking a cystatin C seems reasonable to try to triangulate where he is currently.   07/2022 - Cystatin C at 03/2022 visit was in agreement w/SCr for eGFR. Sat Dr Pricilla in f/u most recently on 26 Jan. Per note, xp eval is ongoing and pt favors HD over PD, when time comes; holding on referral for AVF creation.  05/2023 - had appt w/Dr [facility] in early December, no changes. Plan remained to pursue PD first before iHD. He says the plan is to still watch and wait - no plans for imminent start of PD.  10/2023 - Most recent f/u w/Dr [facility] was on 08Apr. SCr continued to slowly trend upward, was at 3.10 on 7Mar2025. No major changes in plan per note.  05/2024 - Saw Dr. Pricilla on [place], no signif changes in plans per note.  ***      Liver disease  - HCV, genotype 1a, dx'd 1990s and cured s/p ELB/GRZ x12 weeks in 2017  - RESOLVED  - Chronic transaminitis and declining platelets c/f NASH (08/2018 - present)  - chronic, stable     Lab Results   Component Value Date  AST 22 05/04/2023    AST 92 (A) 08/04/2022    AST 57 (H) 06/26/2022    AST 55 (H) 03/12/2022    AST 52 (H) 09/23/2021     Lab Results   Component Value Date    ALT 34 05/04/2023    ALT 134 (A) 08/04/2022    ALT 81 (H) 06/26/2022    ALT 81 (H) 03/12/2022    ALT 106 (H) 09/23/2021     Platelet   Date Value Ref Range Status   05/04/2023 117 (L) 150 - 450 10*9/L Final   08/06/2022 127 (L) 150 - 450 10*9/L Final   06/26/2022 103 (L) 150 - 450 10*9/L Final   03/12/2022 138 (L) 150 - 450 10*9/L Final   09/23/2021 128 (L) 150 - 450 10*9/L Final     03/2022 - Liver bx on 08Dec2006 showed grade 2 stage 2. EGD 09/2015 was w/o varices (normal plts @ time per Dr Norbert note from 04/06/2016). Because of his CKD, he was treated with 12 weeks of ELB/GRZ. HCV RNA at start of tx in 01/2016 was 328K, down to ND by 05/2016 and ND on rechecks in 07/2016, 09/2016, 11/2017, 12/2019, and 06/2021. Most recent GI f/u was in Jan 2023 [facility] NP) - per her note, Fibroscan in clinic was now F0-1 from [facility] pre-treatment. Overall assessment was for NASH. Plan = RTC 5-23m (summer 2023) - no f/u in future appts. Today, doing fine - no interval issues reported  07/2022 - No interim visits w/[facility]. LFTs checked this past Monday by PCP and manually entered in Epic so they appear alongside other values - see above - a bit higher than most recent checks but not markedly out  05/2023 - transaminases markedly improved between 07/2022 and 05/2023 assessments  10/2023 - no interim concerns  05/2024 - ***  ***      Cardiac disease  - HFpEF (EF 60-65% on TTE 09/2019)  - chronic, stable   - CAD s/p NSTEMI 10/2015 (LHC w/ multivessel dx - no stent, med mgm't) - chronic, stable   - Aortic sclerosis  - chronic, stable   - Difficult to control HTN  - chronic, stable   03/2022 - last visit w/[facility] 11/2019 [facility] MD). Reported 68m worsening fatigue, was referred for home sleep study given c/f OSA. Carvedilol , ASA 81, atorva 20, torsemide  20, metolazone  10, spirono 25, amlo 7.5, atenolol  100, doxazosin  4, lisino 40, hydralazine  100 bid all cont'd. Plan was RTC in 68m (early 2022) - no f/u in future appts. Today, feeling well - no CP/DOE reported. He's walking frequently but no signif other exercise.  07/2022 - saw cards fellow Dr Skeeter on 02Feb for f/u - continuing all current meds.   05/2023 - no chest pain or pressure, he initially denies any issues w/exercise tolerance from a cardiopulm perspective but then says he's having some DOE, mild  10/2023 - most recent f/u on 09/03/2023 ([facility]). No major changes - continuing on atenolol  100/d, aspirin 81/d, torsemide  40/d and spironolactone  25/d, amlo 10/d, lisino 40/d, hydralazine  100 bid, doxazosin  4/d  05/2024 - ***  ***      Pulmonary disease  - Abnormal chest CTs (4mm RLL nodule stable 03/2020 -> 07/2021, ?fibrotic changes 07/2021, 09/2021)  - chronic, stable  - 15 PY smoking hx, quit 1983  - chronic, stable    03/2022 - Saw Interventional Pulm in 09/2021. CTs reviewed, they felt the RLL nodule was of no signif concern given stability  of 2Y timeline - but ordered HRCT given lower lung fibrotic vs atelectatic changes. HRCT completed 16May2023 with impression: Persistent but improved lower lung predominant subpleural reticular abnormality which may represent residual mild fibrosis, possibly from resolving organizing pneumonia. Representative cut (138/239) below. Plan for f/u not described in note.  Today, doing well - no interim issues.        07/2022 - no interim issues though he did describe briefly feeling sometimes a bit short of breath -  but nothing on a consistent basis and he's able to exercise on a regular basis (walking)  05/2023 - no cough or sputum production  10/2023 - no interim concerns  05/2024 - ***  ***  Revisit @ future RTC      Orthopaedic issues  - s/p ORIF R acetabular fx 1990s (@ [facility]), c/b displaced screw in quadriceps (chronic)  - chronic, stable  - s/p lumbar spinal surgery 1990s, c/b chronic LBP  - chronic, stable  - BLE numbness involving both feet, proximally to knees  - chronic, stable  03/2022 - Saw Ortho most recently 07/2020 ([facility]udzinski/[facility]). Today, doing OK - just describes ongoing issues w/neuropathic discomfort in BLEs.  07/2022 - still describing neuropathy sxs and a general sense of heaviness in BLEs, w/o any s/sxs of claudication; he says the pregabalin is maybe helping more than the gaba had  05/2023 - still w/signif interim discomfort related to neuropathy and BLE discomfort  10/2023 - he says his back pain and neuropathy are stable  05/2024 - ***  ***      Endocrine & metabolic issues  - T2DM without use of insulin (A1c 5.9% in 07/2023)  - chronic, stable   - Obesity (BMI 30.5)  - chronic, progressive   - Gout  - chronic, stable   - Abnormal TFTs (low TSH 10/2019 @ [facility], 03/2021 @ [facility])  - chronic, stable   - Abnormal lipids, on statin (HDL 33, LDL 36 in 03/2021 @ [facility])  - chronic, stable   - Normal 25(OH)D level 10/2019  - chronic, stable     Wt Readings from Last 4 Encounters:   02/01/24 97.3 kg (214 lb 9.6 oz)   09/07/23 98.9 kg (218 lb)   09/03/23 100.4 kg (221 lb 6.4 oz)   05/20/23 (!) 102.2 kg (225 lb 6.4 oz)   ...  09/30/21 99.3 kg (219 lb)   11/12/20 98.2 kg (216 lb 6.4 oz)   10/10/19 (!) 104.3 kg (230 lb)   07/14/18 (!) 102.9 kg (226 lb 14.4 oz)   11/02/17 (!) 102.3 kg (225 lb 9.6 oz)      Free T4   Date Value Ref Range Status   03/12/2022 0.86 (L) 0.89 - 1.76 ng/dL Final     T3, Free   Date Value Ref Range Status   03/12/2022 3.07 2.30 - 4.20 pg/mL Final     TSH   Date Value Ref Range Status   08/06/2023 0.767 uIU/mL Final     Comment:     [facility] via [facility] - manually added     Lab Results   Component Value Date    VITDTOTAL 59.4 08/06/2022        03/2022 - Re: T2DM and elevated A1c, he continues on current DM meds w/o any reported missed doses or issues. Re: weight/BMI, stable from 09/2021. Re: gout, no interval episodes. Re: thyroid symptoms, no palpitations, subjective warmth/cold, energy levels, etc.   07/2022 - TFTs from 03/2022 visit had FT4 and TSH slightly  below range, FT3 WNL. No specific sxs today  05/2023 - Progression of neurocognitive issues over time is somewhat concerning but no other clear manifestations of thyroid or metabolic dz today - also recently had blood work done so holding on add'l assessments  10/2023 - Had TSH done in 08/06/2023 through [facility]) -- was 0.767 uIU/mL. A1c was also checked and it was 5.9% (avg 123). Manually added these so they appear alongside other results in [email or web address] .   05/2024 - ***  ***  Defer mgm't to Dr Sadie & team @ [facility]      Sexual health & secondary prevention  - chronic, stable  Married (wife).  NEED TO INQUIRE @ FUTURE RTC      Lab Results   Component Value Date    RPR Nonreactive 05/04/2023    RPR Nonreactive 03/12/2022    CTNAA Negative 06/08/2018    GCNAA Negative 06/08/2018    SPECSOURCE Urine (Male) 06/08/2018       GC/CT NAATs - not being checked routinely for this patient  RPR - repeat 12 months after prior      Health maintenance    Diabetes                       (2024 ADA SoC)  A1c at goal of <7% as of 07/2022  defer mgm't to PCP   Lab Results   Component Value Date    GLU 87 09/07/2023    A1C 5.9 08/06/2023    A1C 6.8 08/04/2022    A1C 6.5 (H) 09/23/2021          Liver health                   (APRI & FIB-4)  suspicion for MAFLD high  monitor over time   Lab Results   Component Value Date    ALT 34 05/04/2023    ALT 134 (A) 08/04/2022    ALT 81 (H) 06/26/2022    PLT 117 (L) 05/04/2023    PLT 127 (L) 08/06/2022    PLT 103 (L) 06/26/2022          ASCVD risk reduction   (REPRIEVE)  baseline risk score >=20%  Has been on atorva since 2017 (40 mg since 07/2018)    AAA screening  assessment needed but deferred to future visit   Lab Results   Component Value Date    LDL 19 08/04/2022    TRIG 838 08/04/2022       The 10-yr ASCVD Risk score (Goff DC Jr., et al., 2013) failed to calculate due to the following reason:  The 2013 10-yr ASCVD risk score is only valid if the patient does not have prior/existing clinical ASCVD (myocardial infarction, stroke, CABG, coronary angioplasty, angina or peripheral arterial disease, coronary atherosclerosis, ischemic heart disease, or cerebrovascular disease). The patient has prior/existing ASCVD.  Had Heart Attack  Has Coronary Atherosclerosis           Kidney health  defer mgm't to Nephrology   Lab Results   Component Value Date    CREATININE 2.77 (H) 09/07/2023    PROTEINUA Negative 05/04/2023    PROTEINUR 15.2 02/01/2024    GLUCOSEU Negative 05/04/2023    ALBCRERAT 8.1 09/29/2022    PCRATIOUR 0.310 02/01/2024          Bone health                    (FRAX)  Patient's  height in cm: 180.3  FRAX score before DEXA: 14% (date: 20 May 2023)  Needs DEXA   Lab Results   Component Value Date    VITDTOTAL 59.4 08/06/2022          Communicable diseases  TB screening  no longer needed; negative IGRA, low risk    Viral hepatitis screening  does not meet screening criteria; no further screening indicated   Quantiferon TB Gold Plus Interpretation   Date Value Ref Range Status   01/29/2020 Negative Negative Final     QFT TB GLD   Date Value Ref Range Status   07/01/2011 NEGATIVE  Final     Hep A IgG   Date Value Ref Range Status   01/29/2020 Reactive (A) Nonreactive Final     Hep B Surface Ag   Date Value Ref Range Status   01/31/2020 Nonreactive Nonreactive Final     Hep B S Ab   Date Value Ref Range Status   01/29/2020 Nonreactive Nonreactive, Grayzone Final     Comment:     Nonreactive and Grayzone results are considered non-immune.     Hep B Core Total Ab   Date Value Ref Range Status   01/31/2020 Reactive (A) Nonreactive Final     Hepatitis C Ab   Date Value Ref Range Status   01/29/2020 Reactive (A) Nonreactive Final     HCV RNA   Date Value Ref Range Status   06/06/2021 Not Detected Not Detected Final     HCV RNA (IU)   Date Value Ref Range Status   02/05/2016 328,448 (H) <12 IU/mL Final   08/01/2014 719603 [IU]/mL Final          Vaccine-preventable illnesses  MMR & VZV screening  measles (rubeola) IgG  mumps IgG      Vaccines needed           (CDC Adult Schedule)  COVID seasonal (1 dose 19-64 - 2 doses 65+)  influenza high-dose (1 dose)  MenACWY 1?? series (2 doses - 0 and 18m)  PCV21 (1 dose >5Y after series)    Hepatitis B core total Ab positive and sAb negative    One dose of Heplisav-B is indicated per most recent HIVMA guidelines - with recheck of HBsAb after    Rec'd single dose of Heplisav-B on 01Sep2021 but no subsequent HBsAb check           Rubella IgG Scr   Date Value Ref Range Status   01/29/2020 Positive  Final     Comment:     A result of Positive or Negative is determined according to the CLSI I/LA6-A recommended cutoff of 10 IU/mL established with the calibrators.     Varicella IgG   Date Value Ref Range Status   01/29/2020 Positive  Final       Most Recent Immunizations   Administered Date(s) Administered    COVID-19 VAC,BIVALENT,MODERNA(BLUE CAP) 01/23/2022    COVID-19 VACCINE,MRNA(MODERNA)(PF) 07/27/2020    Covid-19 Vac, (30yr+) (Spikevax) Monovalent Moderna 03/19/2023    HEPATITIS B VACCINE ADULT, ADJUVANTED, IM(HEPLISAV B) 01/31/2020    HEPATITIS B VACCINE ADULT,IM(ENERGIX B, RECOMBIVAX) 08/24/2016    INFLUENZA ADJUVANTED PF, IIV3(82YR UP)(FLUAD) 03/19/2023    INFLUENZA INJ MDCK PF, QUAD,(FLUCELVAX)(66MO AND UP EGG FREE) 02/28/2019    INFLUENZA QUAD ADJUVANTED 82YR UP(FLUAD) 01/23/2022    INFLUENZA TIV (TRI) PF (IM)(HISTORICAL) 03/25/2011    Influenza Vaccine Quad(IM)6 MO-Adult(PF) 02/26/2021    Influenza Virus Vaccine, unspecified formulation 03/31/2017    PNEUMOCOCCAL  POLYSACCHARIDE 23-VALENT 12/01/2017    PPD Test 03/25/2011    Pneumococcal Conjugate 13-Valent 04/13/2012    RSV VACCINE,ADJUVANTED(PF)(86YRS+)(AREXVY) 08/12/2022    SHINGRIX -ZOSTER VACCINE (HZV),RECOMBINANT,ADJUVANTED(IM) 12/01/2017    TdaP 10/27/2018    Tuberculin Skin Test;unspecified Formulation 03/25/2011          Cancer screening  Anorectal   assessment needed but deferred to future visit    Colorectal  S/p FIT+DNA 10/2020 (neg) but per Dr Pricilla needs c-scope prior to possible transplantation    Liver  S/p US  06/2020 - neg    Lung  screening not indicated    Prostate  screening not indicated   Lab Results   Component Value Date    PSASCRN 0.4 07/09/2010    PSA 0.35 01/29/2020              Oral health  has no  dentist  last exam - long time ([facility] - dentures up/low)  His bottom plate is too loose; broke upper plate (as of 87/80/7975) *** as of 04Dec2025   Eye health  does  use corrective lenses  last exam - 01/2023  Pending procedures for POAG and cataracts (thru [facility])*** as of 04Dec2025       Information above was last reviewed & updated: 04 May 2024       I personally spent ***+10*** minutes face-to-face and non-face-to-face in the care of this patient, which includes all pre, intra, and post visit time on the date of service. (+[id number])***      Disposition  Next appointment: 5-6 months      To do @ next RTC  ***  F/u dose-pack setup  F/u visual impairment / improvement post-op            Subjective        HPI  In addition to details in A&P above:    ***          Past Medical History:   Diagnosis Date    Abnormal EKG 11/09/2015    Acute encephalopathy 11/30/2016    Anemia in stage 3 chronic kidney disease (CMS-HCC) 01/19/2015    Last Assessment & Plan: Formatting of this note might be different from the original. # Severe iron deficiency anemia/hemoglobin- 6/ ferritin 5 [march 2017]. Status post IV iron- significant improvement of hemoglobin. Last IV Feraheme was 08/28/2015 where he received 510 mg. #Was seen by Dr. Rennie in February 2019 with complaints of worsening significant pica/fatigue/cold intolerance.  Started    Bradycardia 11/09/2015    Chronic hepatitis C without hepatic coma    (CMS-HCC) 01/19/2015    CKD (chronic kidney disease) stage 3, GFR 30-59 ml/min (CMS-HCC) 01/08/2015    Coronary artery disease 10/2015    Multivessel disease, plan medical management after cath 6/17    GERD (gastroesophageal reflux disease)     Gout     HCV (hepatitis C virus) 11/22/2012 (genotype 1, HCV RNA =311,000; liver biopsy 05/2005 =chronic hepatitis grade II stage II).  Has consistently and repeatedly refused therapy.       Hepatitis B core antibody positive 11/18/2023    HIV (human immunodeficiency virus infection)    (CMS-HCC)     Undectable viral load 5/17    Hypertension     Nephrolithiasis     Nephrotic syndrome 03/05/2014    Neurological deficit present 11/13/2015    Weakness in lower extremities    Personal history of gout 10/27/2018    Renal mass     Followed by neprhology, MRI  8/16 improved, felt to be benign       Social History  Background - Originally from [facility] [place] Lived in [place] when he was younger. Was a coach for long-distance and high jump, triple jump.     Housing - in [place] with wife - no pets at home - has a son and daughter, grandkids and great-grandkids - all of them in [place] ([place], [place]) -- confirmed 03/2022 ***  School / Work & Benefits - not in school, retired (previously worked doing ISS to help troubled youth), and on Medicare -- confirmed 03/2022 ***    Tobacco - 15PY history of cigarettes, quit 1983 -- confirmed 03/2022 ***  Alcohol - never drinks alcohol -- confirmed 03/2022 ***  Substance use - previous IDU (VERY distant) -- confirmed 03/2022 ***      Medications and Allergies  He has a current medication list which includes the following prescription(s): atenolol , carvedilol , glimepiride , lactulose, nortriptyline, ozempic, peg-electrolyte soln, torsemide , accu-chek guide glucose meter, accu-chek guide l1-l2 ctrl sol, allopurinol , amlodipine , aspirin, atorvastatin , atropine, onetouch verio test strips, calcium  carbonate-vitamin d2, calcium -vitamin d, cholecalciferol  (vitamin d3 25 mcg (1,000 units)), clonidine  hcl, diazepam , dicyclomine, tivicay , pifeltro , dorzolamide -timolol , erythromycin, ferrous sulfate, folic acid, hydralazine , hydrocodone-acetaminophen, lisinopril , nitroglycerin , ofloxacin, prednisolone acetate, pregabalin, rocklatan, sertraline, tamsulosin , and timolol .    Allergies: Colchicine analogues and Tramadol      Family History  His family history includes Cancer in his father and mother; Diabetes in his brother; Heart disease (age of onset: 101) in his sister; Hypertension in his mother and sister; Kidney disease in his father; Kidney failure (age of onset: 42) in his sister; Stroke in his brother; Thyroid disease in his mother.               Objective      There were no vitals taken for this visit.  ***    Const WDWN, NAD, non-toxic appearance    Eyes lids normal bilaterally, conjunctiva anicteric and noninjected OU   PERRL    ENMT normal appearance of external nose and ears, no nasal discharge   OP clear - edentulous - dentures in upper and lower   Neck neck of normal appearance and trachea midline   no thyromegaly, nodules, or tenderness    Lymph no LAD in neck    CV RRR, no r/g, S1/S2 - difficult auscultation (distant)  no peripheral edema, WWP    Resp normal WOB   on RA, no breathlessness with speaking, no coughing, CTAB    GI normal inspection, NTND, NABS - protuberant  no umbilical hernia on exam    GU deferred   MSK no clubbing or cyanosis of hands   no focal tenderness or abnormalities of joints of RUE, LUE, RLE, or LLE    Skin no rashes, lesions, or ulcers of visualized skin   no nodules or areas of induration of palpated skin    Neuro CNs II-XII grossly intact   sensation to light touch grossly intact throughout    Psych appropriate affect   oriented to person, place, time

## 2024-05-10 DIAGNOSIS — B20 Human immunodeficiency virus [HIV] disease: Principal | ICD-10-CM

## 2024-05-10 MED ORDER — PIFELTRO 100 MG TABLET
ORAL_TABLET | Freq: Every day | ORAL | 1 refills | 30.00000 days | Status: CP
Start: 2024-05-10 — End: ?
  Filled 2024-05-19: qty 30, 30d supply, fill #0

## 2024-05-10 NOTE — Telephone Encounter (Signed)
 Medication Requested: doravirine  (PIFELTRO ) 100 mg Tab       Future Appointments   Date Time Provider Department Center   06/05/2024 10:05 AM Hladik, Elna Dawn, MD [facility]     Per Provider Note: Current regimen:   dolutegravir  and doravirine     Standing order protocol requirements met?: Yes    Sent to: Pharmacy per protocol    Days Supply Given: 30 days  Number of Refills: 1

## 2024-05-16 NOTE — Progress Notes (Signed)
 [facility] Refill Coordination Note    Specialty Medication(s) to be Shipped:   Infectious Disease: TIVICAY  50 mg TABLET (dolutegravir ) and PIFELTRO  100 mg Tab (doravirine )    Other medication(s) to be shipped: No additional medications requested for fill at this time    Specialty Medications not needed at this time: N/A     Samuel [name], DOB: 1950-08-08  Phone: There are no phone numbers on file.      All above HIPAA information was verified with patient's family member, wife.     Was a nurse, learning disability used for this call? No    Completed refill call assessment today to schedule patient's medication shipment from the [facility]  [phone]).  All relevant notes have been reviewed.     Specialty medication(s) and dose(s) confirmed: Regimen is correct and unchanged.   Changes to medications: Samuel [name] reports no changes at this time.  Changes to insurance: No  New side effects reported not previously addressed with a pharmacist or physician: None reported  Questions for the pharmacist: No    Confirmed patient received a Conservation Officer, Historic Buildings and a Surveyor, Mining with first shipment. The patient will receive a drug information handout for each medication shipped and additional FDA Medication Guides as required.       DISEASE/MEDICATION-SPECIFIC INFORMATION        N/A    SPECIALTY MEDICATION ADHERENCE     Medication Adherence    Patient reported X missed doses in the last month: 3-4  Specialty Medication: TIVICAY  50 mg TABLET (dolutegravir )  Patient is on additional specialty medications: Yes  Additional Specialty Medications: PIFELTRO  100 mg Tab (doravirine )  Patient Reported Additional Medication X Missed Doses in the Last Month: 3-4  Patient is on more than two specialty medications: No  Informant: spouse              Were doses missed due to medication being on hold? No    TIVICAY  50 mg TABLET (dolutegravir ): 7 days of medicine on hand   PIFELTRO  100 mg Tab (doravirine ): 7 days of medicine on hand       Specialty medication is an injection or given on a cycle: No    REFERRAL TO PHARMACIST     Referral to the pharmacist: Not needed      [facility]     Shipping address confirmed in Epic.     Cost and Payment: Patient has a $0 copay, payment information is not required.    Delivery Scheduled: Yes, Expected medication delivery date: 12/22.     Medication will be delivered via Next Day Courier to the prescription address in Epic WAM.    [name]

## 2024-05-18 DIAGNOSIS — H548 Legal blindness, as defined in USA: Principal | ICD-10-CM

## 2024-05-18 DIAGNOSIS — I5032 Chronic diastolic (congestive) heart failure: Principal | ICD-10-CM

## 2024-05-18 DIAGNOSIS — N184 Chronic kidney disease, stage 4 (severe): Principal | ICD-10-CM

## 2024-05-18 DIAGNOSIS — H4089 Other specified glaucoma: Principal | ICD-10-CM

## 2024-05-18 DIAGNOSIS — E1140 Type 2 diabetes mellitus with diabetic neuropathy, unspecified: Principal | ICD-10-CM

## 2024-05-18 DIAGNOSIS — Z79899 Other long term (current) drug therapy: Principal | ICD-10-CM

## 2024-05-18 DIAGNOSIS — Z0184 Encounter for antibody response examination: Principal | ICD-10-CM

## 2024-05-18 DIAGNOSIS — I2510 Atherosclerotic heart disease of native coronary artery without angina pectoris: Principal | ICD-10-CM

## 2024-05-18 DIAGNOSIS — M545 Chronic midline low back pain without sciatica: Principal | ICD-10-CM

## 2024-05-18 DIAGNOSIS — Z5181 Encounter for therapeutic drug level monitoring: Principal | ICD-10-CM

## 2024-05-18 DIAGNOSIS — G8929 Other chronic pain: Principal | ICD-10-CM

## 2024-05-18 DIAGNOSIS — F4323 Adjustment disorder with mixed anxiety and depressed mood: Principal | ICD-10-CM

## 2024-05-18 DIAGNOSIS — Z9189 Other specified personal risk factors, not elsewhere classified: Principal | ICD-10-CM

## 2024-05-18 DIAGNOSIS — B20 Human immunodeficiency virus [HIV] disease: Principal | ICD-10-CM

## 2024-05-18 DIAGNOSIS — Z113 Encounter for screening for infections with a predominantly sexual mode of transmission: Principal | ICD-10-CM

## 2024-05-18 DIAGNOSIS — R918 Other nonspecific abnormal finding of lung field: Principal | ICD-10-CM

## 2024-05-18 NOTE — Progress Notes (Signed)
 [facility] Checklist     Type of visit:  video    Are you located in [place]? yes    Reason for visit: f/u    Questions / Concerns that need to be addressed:     General Consent to Treat (GCT) for epic video visits only: Verbal consent    HCDM reviewed and updated in Epic:    We are working to make sure all of our patients??? wishes are updated in Epic and part of that is documenting a Environmental Health Practitioner for each patient  A Health Care Decision Maker is someone you choose who can make health care decisions for you if you are not able - who would you most want to do this for you????  is already up to date.    HCDM (patient stated preference): Samuel [name],Samuel [name] - Spouse - [phone]    COVID-19 Vaccine Summary  Which COVID-19 Vaccine was administered  Moderna  Type:  Dates Given:                   If no: Are you interested in scheduling? Declines vaccine

## 2024-05-18 NOTE — Progress Notes (Signed)
 [facility]  [address]  [place], [place]  [address]  P [phone]  F [phone]     PCP:    [name], MD      Care team:  Raphael Carrier, MD (Cardiology)     Pricilla Elna Dawn, MD (Nephrology)     Gupta, Divakar, MD (Ophtho @ [facility])    Last visit with me:  10/2023 (video visit)      I spent 22 minutes on this real-time audio and video call with the patient. I spent an additional 33 minutes on pre- and post-visit activities.     The patient was physically located in [place]  or a state in which I am permitted to provide care. The patient understood that s/he may incur co-pays and cost sharing, and agreed to the telemedicine visit. The visit was completed via phone and/or video, which was appropriate and reasonable under the circumstances given the patient's presentation at the time.    The patient has been advised of the potential risks and limitations of this mode of treatment (including, but not limited to, the absence of in-person examination) and has agreed to be treated using telemedicine. The patient's/patient's family's questions regarding telemedicine have been answered.     If the phone/video visit was completed in an ambulatory setting, the patient has also been advised to contact their provider???s office for worsening conditions, and seek emergency medical treatment and/or call 911 if the patient deems either necessary.      Assessment/Plan:      HIV (dx'd 12/1999, nadir CD4 239 / 30% in 06/2011)  - chronic, stable    Current regimen:   dolutegravir  and doravirine     ARV hx  Atripla (first regimen; rx'd 09/2007) - ~2010 - 07/2015  Descovy  + Tivicay  (d/t CKD) - 07/2015 - 12/2020  ABC 600/d + 3TC 100/d + DTG 50/d    - 12/2020 - 01/2021   (I didn't feel right - mostly GI symptoms)  Descovy  + Tivicay  - 01/2021 - 04/2022  Tivicay  + Pifeltro  - 04/2022 - present    Med access via Medicare.       Absolute CD4 Count   Date Value Ref Range Status   05/04/2023 782 510 - 2,320 /uL Final     CD4% (T Helper)   Date Value Ref Range Status   05/04/2023 34 34 - 58 % Final     HIV RNA Quant Result   Date Value Ref Range Status   05/04/2023 Detected (A) Not Detected Final     HIV RNA   Date Value Ref Range Status   05/04/2023 <20 (H) <0 copies/mL Final        Resistance hx  06/2002 - PR: 71T  01/2005 - PR: 41K, 63P, 71T -- RT: 196E, 214F  09/2007 - PR: 93P, 63S, 71T -- RT: 333E  07/2008 - PR: 63P, 63S, 71T -- RT: 333E  05/2023 - cumulative above with no significant resistance - [facility] HIVdb permanent link       03/2022 - Doing OK overall - tolerated the switch back to Descovy  from split ABC + 3TC w/o difficulty.   07/2022 - He tolerated the switch to Pifeltro  well - no issues. Takes these at nighttime, before bedtime. Looked at potential issues w/doravirine  or dolutegravir  with iHD, and they both seem to be OK - though dosing after HD would probably be advisable.  05/2023 - labs drawn at Dr Orpah appt on 03 Dec - CD4 count  stable and RNA detected but <20, indicating ongoing stability on NRTI-sparing regimen of DOR and DTG. No issues w/refills or adherence.  10/2023 - no issues w/getting Rxs filled on time  05/2024 - Somehow I forgot to take my HIV medicine, three days or so. He says he called the pharmacy and they provided some reassurance. His wife asks about possible interactions among his current meds and: beet juice, carrot juice, cinnamon, ginseng, and ashwagandha    CD4 count over 300 for >2Y on suppressive ART; no prophylaxis needed; recheck CD4 annually  Discussed ARV adherence  CD4, HIV RNA, and safety labs (full return panel)   Continue current therapy  Encouraged continued excellent ARV adherence  Will ask our pharmD to assess for possible interactions as above      Ophthalmologic issues  Glaucoma s/p phacotrabeculectomy OU, c/b postop dehiscence and hyphema  - acute, complicated   Visual impairment, acute  - acute, complicated   Legal blindness  - chronic, stable   Adjustment disorder related to vision loss  - acute, self-limited   10/2023 - Luismiguel says could be better. Says the healing process hasn't been good. His wife says Dr Charlanne was the person for the eyes. His eyes are not healing very well. In fact he's losing his sight. Going back 6/24, not sure what's going to happen. Been dealing with this since January. April was the second surgery (on the other eye). I asked what the issue with healing was, exactly has had some bleeding & redness. His wife wanted to make sure this didn't have anything to with his HIV - reassurance provided, based on his CD4 counts over time. He's having difficulty w/his phone bc he can't swipe to do tasks. They're looking at getting a shower chair. He has a cane for ambulation, but he's having difficulty with sussing out distance to steps, curbs. Provided some info re: keeping receipts for DME (e.g., shower chair, grab bars, etc.) for possible reimbursement. I reviewed records via CareEverywhere after I disconnected w/them - most recent encounter 10/19/2023 (Drs Cesario & Charlanne). Dxs were of primary open-angle glaucoma OU, severe/advanced at presentation, s/p cataract extraction and intraocular lens implant (CE/LOI) of L eye with trabeculectomy on 27Jan2025; had a mild trauma (granddaughter poked in left eye) prior to f/u on 07/13/2023. At that visit, vision in R eye was subjectively better. Had add'l f/u visits on 08/03/2023 and 08/31/2023 - left eye at that visit had still some fibrin on IOL. Underwent CE/IOL and trabeculectomy of RIGHT eye on 09/27/2023. Adherence to gtts was unclear after this, per f/us  on 5/6 and 5/13. Wound dehiscence noted on nasal margin of incision area on 10/12/2023 visit. By 5/20 had a mucus plug at the area of dehiscence and a 1.83mm layered hyphema. The hyphema was still present at 5/27 visit - and he had completed imaging showing multiple chronic infarcts (see below). I asked about things to potentially help improve his safety - and specifically inquired about pre-packaged med dose packs (ie, adherence packaging). They were unaware of this option but both felt like this would be of great value to them. I went to [facility] ([email or web address]) and searched for locations in [facility] that offer delivery and adherence packaging. There are two: [employer] and [employer]. I'm going to ask our clinic pharmD, Dr. Ranny, for some assistance in getting this set up for them. I think we'll need medication reconciliation first and then once we have a defined and accurate med  list, we can coordinate with one of those two pharmacies to prepare dose-packs for [place].  05/2024 - At 12/2023 f/u with [facility], they had concerns for add'l processes and got labs for myasthenia, B12, and folate; there was also a plan to return to OR for reposition of the intraocular lens vs exchange on the RIGHT eye. They consider me legally blind, now they're putting me with people that can help me, that's like me. And getting me some things that I can use for myself. He's not driving any longer. They're monitoring the pressures but no further procedures pending. I'm not doing good being out in public. I had a little episode day before last. My wife works in a center and I was sitting in there and was talking to this guy and what he did was, he didn't mean to, but he said I like those glasses and then he made another comment. Social work for blind folks in [place] called him last night, first time outreach. He's also connected with the [employer]. I don't go out a lot, and [name]'s saying you need to try to get out, but I don't want to. Followed up about the pill pack set up by [name] - The pill pack is working.   Reassurance and validation provided about his feelings of social anxiety/withdrawal in setting of adjusting to new disability (legal blindness) - encouraged him to meaningfully engage w/Association       Neurocognitive concerns - chronic, progressive   05/2023 - Had extended discussion with pt and his wife about concerns she expressed in visit re: lack of motivation, forgetfulness, and perceived changes in his attention/engagement. He feels the onset was around time of worsening of his vision, and describes not being able to do things that he enjoyed doing previously, bc of difficulty with visual acuity (e.g., texting affirmations and messages to loved ones; reading; driving). This has also introduced some add'l stress in form of caretaking responsibilities on his wife. Discussed pros/cons of baseline neurocognitive testing given high prevalence of neurocognitive dysfunction among people who have lived w/HIV for a long time (HAND). Here, DDx I think is fairly broad; I think depression or adjustment disorder would probably be highest, followed by med effects (diazepam , dicyclomine, pregabalin, ??-blockade), thyroid dz, possible early manifestations of ESRD, and HAND. Proposed a plan to get thru his eye procedures and see how things improve after this - then pursue add'l evals/workups after this, incrementally. Both he and his wife said this sounded reasonable to them.  10/2023 - At a late May f/u visit post-op, Dr Charlanne was concerned about a facial droop, referred to PCP. Dr Sadie looked at him and routed him to the ED. They did a CT and an MRI. From this, they had found mini strokes, and a lot of them, but there were none active and they were old. He is very weak in his body. Dr Sadie did about 3 weeks of [PT] with him at the house.  They also did some HH-based medication reconciliation. We discussed that knowing Conlin has had some chronic infarcts is helpful in some ways to explain some of the neurocog issues that he and his wife conveyed to me at 05/2023 visit.  05/2024 - Doing OK in interim, though his wife noted that he has been having some headaches recently and said they were trying to avoid acetaminophen. She asked if ashwagandha might help with this, but it doesn't really have analgesic qualities -  so encouraged him to use something like acetaminophen (as Tylenol) instead, as long as they follow package directions. She indicated that he had been started on nortriptyline from Dr. Lane and that after starting this, Sparsh began having episodes of sleepwalking and confusion.  Stop nortriptyline - will ask our pharmD to connect w/[facility] and have this removed from his pill-pack  OK to take acetaminophen within regular dosing range  Encouraged socialization w/community resources for the blind (as above)  Recheck @ next RTC      CKD stage G4/A2, pre-transplant  - chronic, progressive     eGFR   Date Value Ref Range Status   05/09/2024 25.0 mg/dL Final     Creatinine   Date Value Ref Range Status   05/09/2024 2.60 mg/dL Final     Cystatin C   Date Value Ref Range Status   03/12/2022 2.26 (H) 0.64 - 1.23 mg/L Final       03/2022 - Initial transplant eval 07/2020. Most recent visit w/[facility] 08/2021. Per HPI, doing well in interim. BP 133/79. Slow progression. No indication for HD. He is to complete a colonoscopy as the final phase of his transplant evaluation. Plan = RTC 1m (planned for 03/26/2022). With respect to his ARVs, the problem is that now his eGFR is consistently under 30 mL/min, and use of fixed-dose Descovy  isn't recommended; the threshold for TAF by itself is 15 mL/min, (so it's OK) but for FTC it's 30 mL/min as the lower threshold before interval-spacing is required (for him, it'd be 200 mg every 72h). He's already having challenges keeping up w/his meds, so I'm concerned about making any changes at this time. I'm also a little concerned about possible influence of dolutegravir  on creatinine secretion here and it maybe spuriously making his actual eGFR look worse - so checking a cystatin C seems reasonable to try to triangulate where he is currently.   07/2022 - Cystatin C at 03/2022 visit was in agreement w/SCr for eGFR. Sat Dr Pricilla in f/u most recently on 26 Jan. Per note, xp eval is ongoing and pt favors HD over PD, when time comes; holding on referral for AVF creation.  05/2023 - had appt w/Dr [facility] in early December, no changes. Plan remained to pursue PD first before iHD. He says the plan is to still watch and wait - no plans for imminent start of PD.  10/2023 - Most recent f/u w/Dr [facility] was on 08Apr. SCr continued to slowly trend upward, was at 3.10 on 7Mar2025. No major changes in plan per note.  05/2024 - Saw Dr. Pricilla on [place], no signif changes in plans per note.  Monitor over time      Liver disease  - HCV, genotype 1a, dx'd 1990s and cured s/p ELB/GRZ x12 weeks in 2017  - RESOLVED  - Chronic transaminitis and declining platelets c/f NASH (08/2018 - present)  - chronic, stable     Lab Results   Component Value Date    AST 17 05/09/2024    AST 22 05/04/2023    AST 92 (A) 08/04/2022    AST 57 (H) 06/26/2022    AST 55 (H) 03/12/2022     Lab Results   Component Value Date    ALT 18 05/09/2024    ALT 34 05/04/2023    ALT 134 (A) 08/04/2022    ALT 81 (H) 06/26/2022    ALT 81 (H) 03/12/2022     Platelet   Date Value Ref Range Status  05/04/2023 117 (L) 150 - 450 10*9/L Final   08/06/2022 127 (L) 150 - 450 10*9/L Final   06/26/2022 103 (L) 150 - 450 10*9/L Final   03/12/2022 138 (L) 150 - 450 10*9/L Final   09/23/2021 128 (L) 150 - 450 10*9/L Final     03/2022 - Liver bx on 08Dec2006 showed grade 2 stage 2. EGD 09/2015 was w/o varices (normal plts @ time per Dr Norbert note from 04/06/2016). Because of his CKD, he was treated with 12 weeks of ELB/GRZ. HCV RNA at start of tx in 01/2016 was 328K, down to ND by 05/2016 and ND on rechecks in 07/2016, 09/2016, 11/2017, 12/2019, and 06/2021. Most recent GI f/u was in Jan 2023 [facility] NP) - per her note, Fibroscan in clinic was now F0-1 from [facility] pre-treatment. Overall assessment was for NASH. Plan = RTC 5-26m (summer 2023) - no f/u in future appts. Today, doing fine - no interval issues reported  07/2022 - No interim visits w/[facility]. LFTs checked this past Monday by PCP and manually entered in Epic so they appear alongside other values - see above - a bit higher than most recent checks but not markedly out  05/2023 - transaminases markedly improved between 07/2022 and 05/2023 assessments  10/2023 - no interim concerns  05/2024 - transaminases have normalized since early 2024 - OK to use acetaminophen per directions for analgesia PRN  Monitor over time      Cardiac disease  - HFpEF (EF 60-65% on TTE 09/2019)  - chronic, stable   - CAD s/p NSTEMI 10/2015 (LHC w/ multivessel dx - no stent, med mgm't) - chronic, stable   - Aortic sclerosis  - chronic, stable   - Difficult to control HTN  - chronic, stable   03/2022 - last visit w/[facility] 11/2019 [facility] MD). Reported 4m worsening fatigue, was referred for home sleep study given c/f OSA. Carvedilol , ASA 81, atorva 20, torsemide  20, metolazone  10, spirono 25, amlo 7.5, atenolol  100, doxazosin  4, lisino 40, hydralazine  100 bid all cont'd. Plan was RTC in 35m (early 2022) - no f/u in future appts. Today, feeling well - no CP/DOE reported. He's walking frequently but no signif other exercise.  07/2022 - saw cards fellow Dr Skeeter on 02Feb for f/u - continuing all current meds.   05/2023 - no chest pain or pressure, he initially denies any issues w/exercise tolerance from a cardiopulm perspective but then says he's having some DOE, mild  10/2023 - most recent f/u on 09/03/2023 ([facility] & [facility]). No major changes - continuing on atenolol  100/d, aspirin 81/d, torsemide  40/d and spironolactone  25/d, amlo 10/d, lisino 40/d, hydralazine  100 bid, doxazosin  4/d  05/2024 - no interim issues  Revisit @ next RTC      Pulmonary disease  - Abnormal chest CTs (4mm RLL nodule stable 03/2020 -> 07/2021, ?fibrotic changes 07/2021, 09/2021)  - chronic, stable  - 15 PY smoking hx, quit 1983  - chronic, stable    03/2022 - Saw Interventional Pulm in 09/2021. CTs reviewed, they felt the RLL nodule was of no signif concern given stability of 2Y timeline - but ordered HRCT given lower lung fibrotic vs atelectatic changes. HRCT completed 16May2023 with impression: Persistent but improved lower lung predominant subpleural reticular abnormality which may represent residual mild fibrosis, possibly from resolving organizing pneumonia. Representative cut (138/239) below. Plan for f/u not described in note.  Today, doing well - no interim issues.        07/2022 - no  interim issues though he did describe briefly feeling sometimes a bit short of breath -  but nothing on a consistent basis and he's able to exercise on a regular basis (walking)  05/2023 - no cough or sputum production  10/2023 - no interim concerns  05/2024 - no interim issues  Revisit @ next RTC      Orthopaedic issues  - s/p ORIF R acetabular fx 1990s (@ [facility]), c/b displaced screw in quadriceps (chronic)  - chronic, stable  - s/p lumbar spinal surgery 1990s, c/b chronic LBP  - chronic, stable  - BLE numbness involving both feet, proximally to knees  - chronic, stable  03/2022 - Saw Ortho most recently 07/2020 (Obudzinski/Chen). Today, doing OK - just describes ongoing issues w/neuropathic discomfort in BLEs.  07/2022 - still describing neuropathy sxs and a general sense of heaviness in BLEs, w/o any s/sxs of claudication; he says the pregabalin is maybe helping more than the gaba had  05/2023 - still w/signif interim discomfort related to neuropathy and BLE discomfort  10/2023 - he says his back pain and neuropathy are stable  05/2024 - did not inquire  Revisit @ next RTC      Endocrine & metabolic issues  - T2DM without use of insulin (A1c 5.9% in 07/2023)  - chronic, stable   - Obesity (BMI 30.5)  - chronic, progressive   - Gout  - chronic, stable   - Abnormal TFTs (low TSH 10/2019 @ [facility], 03/2021 @ [facility])  - chronic, stable   - Abnormal lipids, on statin (HDL 33, LDL 36 in 03/2021 @ [facility])  - chronic, stable   - Normal 25(OH)D level 10/2019  - chronic, stable     Wt Readings from Last 4 Encounters:   02/01/24 97.3 kg (214 lb 9.6 oz)   09/07/23 98.9 kg (218 lb)   09/03/23 100.4 kg (221 lb 6.4 oz)   05/20/23 (!) 102.2 kg (225 lb 6.4 oz)   ...  09/30/21 99.3 kg (219 lb)   11/12/20 98.2 kg (216 lb 6.4 oz)   10/10/19 (!) 104.3 kg (230 lb)   07/14/18 (!) 102.9 kg (226 lb 14.4 oz)   11/02/17 (!) 102.3 kg (225 lb 9.6 oz)      Free T4   Date Value Ref Range Status   03/12/2022 0.86 (L) 0.89 - 1.76 ng/dL Final     T3, Free   Date Value Ref Range Status   03/12/2022 3.07 2.30 - 4.20 pg/mL Final     TSH   Date Value Ref Range Status   05/09/2024 0.430 uIU/mL Final     Lab Results   Component Value Date    VITDTOTAL 59.4 08/06/2022    A1C 5.7 05/09/2024       03/2022 - Re: T2DM and elevated A1c, he continues on current DM meds w/o any reported missed doses or issues. Re: weight/BMI, stable from 09/2021. Re: gout, no interval episodes. Re: thyroid symptoms, no palpitations, subjective warmth/cold, energy levels, etc.   07/2022 - TFTs from 03/2022 visit had FT4 and TSH slightly below range, FT3 WNL. No specific sxs today  05/2023 - Progression of neurocognitive issues over time is somewhat concerning but no other clear manifestations of thyroid or metabolic dz today - also recently had blood work done so holding on add'l assessments  10/2023 - Had TSH done in 08/06/2023 through [facility]) -- was 0.767 uIU/mL. A1c was also checked and it was 5.9% (avg 123). Manually added these so  they appear alongside other results in [email or web address] .   05/2024 - labs updated w/Dr Hande on 05/09/2024, A1c excellent at 5.7% and TSH slightly low at 0.430 (LLON 0.450)  Defer to Dr Sadie      Sexual health & secondary prevention  - chronic, stable  Married (wife).  NEED TO INQUIRE @ FUTURE RTC      Lab Results   Component Value Date    RPR Nonreactive 05/04/2023    RPR Nonreactive 03/12/2022    CTNAA Negative 06/08/2018    GCNAA Negative 06/08/2018    SPECSOURCE Urine (Male) 06/08/2018     GC/CT NAATs - not being checked routinely for this patient  RPR - repeat 12 months after prior      Health maintenance    Diabetes                       (2024 ADA SoC)  A1c at goal of <7% as of 07/2022  defer mgm't to PCP   Lab Results   Component Value Date    GLU 123 05/09/2024    A1C 5.7 05/09/2024    A1C 5.9 08/06/2023    A1C 6.8 08/04/2022          Liver health                   (APRI & FIB-4)  suspicion for MAFLD high  monitor over time   Lab Results   Component Value Date    ALT 18 05/09/2024    ALT 34 05/04/2023    ALT 134 (A) 08/04/2022    PLT 117 (L) 05/04/2023    PLT 127 (L) 08/06/2022    PLT 103 (L) 06/26/2022          ASCVD risk reduction   (REPRIEVE)  baseline risk score >=20%  Has been on atorva since 2017 (40 mg since 07/2018)    AAA screening  assessment needed but deferred to future visit   Lab Results   Component Value Date    LDL 111 05/09/2024    TRIG 179 05/09/2024       The 10-yr ASCVD Risk score Verdon DC Jr., et al., 2013) failed to calculate due to the following reason:  The 2013 10-yr ASCVD risk score is only valid if the patient does not have prior/existing clinical ASCVD (myocardial infarction, stroke, CABG, coronary angioplasty, angina or peripheral arterial disease, coronary atherosclerosis, ischemic heart disease, or cerebrovascular disease). The patient has prior/existing ASCVD.  Had Heart Attack  Has Coronary Atherosclerosis           Kidney health  defer mgm't to Nephrology   Lab Results   Component Value Date    CREATININE 2.60 05/09/2024    PROTEINUA Negative 05/04/2023    PROTEINUR 15.2 02/01/2024    GLUCOSEU Negative 05/04/2023    ALBCRERAT 8.1 09/29/2022    PCRATIOUR 0.310 02/01/2024          Bone health                    (FRAX)  Patient's height in cm: 180.3  FRAX score before DEXA: 14% (date: 20 May 2023)  Needs DEXA   Lab Results   Component Value Date    VITDTOTAL 59.4 08/06/2022          Communicable diseases  TB screening  no longer needed; negative IGRA, low risk    Viral hepatitis screening  does not meet screening criteria; no further  screening indicated   Quantiferon TB Gold Plus Interpretation   Date Value Ref Range Status   01/29/2020 Negative Negative Final     QFT TB GLD   Date Value Ref Range Status   07/01/2011 NEGATIVE  Final     Hep A IgG   Date Value Ref Range Status   01/29/2020 Reactive (A) Nonreactive Final     Hep B Surface Ag   Date Value Ref Range Status   01/31/2020 Nonreactive Nonreactive Final     Hep B S Ab   Date Value Ref Range Status   01/29/2020 Nonreactive Nonreactive, Grayzone Final     Comment:     Nonreactive and Grayzone results are considered non-immune.     Hep B Core Total Ab   Date Value Ref Range Status   01/31/2020 Reactive (A) Nonreactive Final     Hepatitis C Ab   Date Value Ref Range Status   01/29/2020 Reactive (A) Nonreactive Final     HCV RNA   Date Value Ref Range Status   06/06/2021 Not Detected Not Detected Final     HCV RNA (IU)   Date Value Ref Range Status   02/05/2016 328,448 (H) <12 IU/mL Final   08/01/2014 719603 [IU]/mL Final          Vaccine-preventable illnesses  MMR & VZV screening  measles (rubeola) IgG  mumps IgG      Vaccines needed           (CDC Adult Schedule)  COVID seasonal (1 dose 19-64 - 2 doses 65+)  influenza high-dose (1 dose)  MenACWY 1?? series (2 doses - 0 and 58m)  PCV21 (1 dose >5Y after series)    Hepatitis B core total Ab positive and sAb negative    One dose of Heplisav-B is indicated per most recent HIVMA guidelines - with recheck of HBsAb after    Rec'd single dose of Heplisav-B on 01Sep2021 but no subsequent HBsAb check           Rubella IgG Scr   Date Value Ref Range Status   01/29/2020 Positive  Final     Comment:     A result of Positive or Negative is determined according to the CLSI I/LA6-A recommended cutoff of 10 IU/mL established with the calibrators.     Varicella IgG   Date Value Ref Range Status   01/29/2020 Positive  Final       Most Recent Immunizations   Administered Date(s) Administered    COVID-19 VAC,BIVALENT,MODERNA(BLUE CAP) 01/23/2022    COVID-19 VACCINE,MRNA(MODERNA)(PF) 07/27/2020    Covid-19 Vac, (47yr+) (Spikevax) Monovalent Moderna 03/19/2023    HEPATITIS B VACCINE ADULT, ADJUVANTED, IM(HEPLISAV B) 01/31/2020    HEPATITIS B VACCINE ADULT,IM(ENERGIX B, RECOMBIVAX) 08/24/2016    INFLUENZA ADJUVANTED PF, IIV3(70YR UP)(FLUAD) 03/19/2023    INFLUENZA INJ MDCK PF, QUAD,(FLUCELVAX)(29MO AND UP EGG FREE) 02/28/2019    INFLUENZA MDCK PF,IIV3(6 MOS UP)(EGG FREE)(FLUCELVAX) 04/04/2024    INFLUENZA QUAD ADJUVANTED 70YR UP(FLUAD) 01/23/2022    INFLUENZA TIV (TRI) PF (IM)(HISTORICAL) 03/25/2011    Influenza Vaccine Quad(IM)6 MO-Adult(PF) 02/26/2021    Influenza Virus Vaccine, unspecified formulation 03/31/2017    PNEUMOCOCCAL POLYSACCHARIDE 23-VALENT 12/01/2017    PPD Test 03/25/2011    Pneumococcal Conjugate 13-Valent 04/13/2012    RSV VACCINE,ADJUVANTED(PF)(52YRS+)(AREXVY) 08/12/2022    SHINGRIX -ZOSTER VACCINE (HZV),RECOMBINANT,ADJUVANTED(IM) 12/01/2017    TdaP 10/27/2018    Tuberculin Skin Test;unspecified Formulation 03/25/2011          Cancer screening  Anorectal   assessment  needed but deferred to future visit    Colorectal  S/p FIT+DNA 10/2020 (neg) but per Dr Pricilla needs c-scope prior to possible transplantation    Liver  S/p US  06/2020 - neg    Lung  screening not indicated    Prostate  screening not indicated   Lab Results   Component Value Date    PSASCRN 0.4 07/09/2010    PSA 0.42 05/09/2024              Oral health  has no  dentist  last exam - long time (edentulous - dentures up/low)  His bottom plate is too loose; broke upper plate (as of 87/80/7975) as of 10/2024   Eye health  does  use corrective lenses  last exam - 01/2023  Pending procedures for POAG and cataracts (thru [facility]) as of 10/2023       Information above was last reviewed & updated: 18 May 2024       I personally spent 55 minutes face-to-face and non-face-to-face in the care of this patient, which includes all pre, intra, and post visit time on the date of service. (+[id number])      Disposition  Next appointment: 5-6 months      To do @ next RTC  Adjustment better?  Engagement/socialization?            Subjective        HPI  In addition to details in A&P above:    Aside from what is documented above, doing OK. Denies any F/C. No signif changes in appetite. No cough or SOB. No N/V. No skin changes.          Past Medical History:   Diagnosis Date    Abnormal EKG 11/09/2015    Acute encephalopathy 11/30/2016    Anemia in stage 3 chronic kidney disease (CMS-HCC) 01/19/2015    Last Assessment & Plan: Formatting of this note might be different from the original. # Severe iron deficiency anemia/hemoglobin- 6/ ferritin 5 [march 2017]. Status post IV iron- significant improvement of hemoglobin. Last IV Feraheme was 08/28/2015 where he received 510 mg. #Was seen by Dr. Rennie in February 2019 with complaints of worsening significant pica/fatigue/cold intolerance.  Started    Bradycardia 11/09/2015    Chronic hepatitis C without hepatic coma    (CMS-HCC) 01/19/2015    CKD (chronic kidney disease) stage 3, GFR 30-59 ml/min (CMS-HCC) 01/08/2015    Coronary artery disease 10/2015    Multivessel disease, plan medical management after cath 6/17    GERD (gastroesophageal reflux disease)     Gout     HCV (hepatitis C virus) 11/22/2012    (genotype 1, HCV RNA =311,000; liver biopsy 05/2005 =chronic hepatitis grade II stage II).  Has consistently and repeatedly refused therapy.       Hepatitis B core antibody positive 11/18/2023    HIV (human immunodeficiency virus infection)    (CMS-HCC)     Undectable viral load 5/17    Hypertension     Nephrolithiasis     Nephrotic syndrome 03/05/2014    Neurological deficit present 11/13/2015    Weakness in lower extremities    Personal history of gout 10/27/2018    Renal mass     Followed by neprhology, MRI 8/16 improved, felt to be benign       Social History  Background - Originally from [facility] [place] Lived in [place] when he was younger. Was a coach for long-distance and high jump, triple jump.  Housing - in [place] with wife - no pets at home - has a son and daughter, grandkids and great-grandkids - all of them in [place] ([place]) -- confirmed 03/2022   School / Work & Benefits - not in school, retired (previously worked doing ISS to help troubled youth), and on Medicare -- confirmed 03/2022     Tobacco - 15PY history of cigarettes, quit 1983 -- confirmed 03/2022   Alcohol - never drinks alcohol -- confirmed 03/2022   Substance use - previous IDU (VERY distant) -- confirmed 03/2022       Medications and Allergies  He has a current medication list which includes the following prescription(s): accu-chek guide glucose meter, accu-chek guide l1-l2 ctrl sol, allopurinol , amlodipine , aspirin, atenolol , atorvastatin , atropine, onetouch verio test strips, calcium  carbonate-vitamin d2, calcium -vitamin d, carvedilol , cholecalciferol  (vitamin d3 25 mcg (1,000 units)), clonidine  hcl, diazepam , dicyclomine, tivicay , pifeltro , dorzolamide -timolol , erythromycin, ferrous sulfate, folic acid, glimepiride , hydralazine , hydrocodone-acetaminophen, lactulose, lisinopril , nitroglycerin , nortriptyline, ofloxacin, ozempic, peg-electrolyte soln, prednisolone acetate, pregabalin, rocklatan, sertraline, tamsulosin , timolol , and torsemide .    Allergies: Colchicine analogues and Tramadol      Family History  His family history includes Cancer in his father and mother; Diabetes in his brother; Heart disease (age of onset: 46) in his sister; Hypertension in his mother and sister; Kidney disease in his father; Kidney failure (age of onset: 61) in his sister; Stroke in his brother; Thyroid disease in his mother.               Objective      There were no vitals taken for this visit.      NOTE: This is a telehealth visit. PEx elements documenting skin, genital, perianal, palpation, or auscultation findings are from the most recent prior face-to-face visit, not this telehealth visit.     Const WDWN, NAD, non-toxic appearance    Eyes lids normal bilaterally, conjunctiva anicteric and noninjected OU   PERRL    ENMT normal appearance of external nose and ears, no nasal discharge   OP clear - edentulous - dentures in upper and lower   Neck neck of normal appearance and trachea midline   no thyromegaly, nodules, or tenderness    Lymph no LAD in neck    CV RRR, no r/g, S1/S2 - difficult auscultation (distant)  no peripheral edema, WWP    Resp normal WOB   on RA, no breathlessness with speaking, no coughing, CTAB    GI normal inspection, NTND, NABS - protuberant  no umbilical hernia on exam    GU deferred   MSK no clubbing or cyanosis of hands   no focal tenderness or abnormalities of joints of RUE, LUE, RLE, or LLE    Skin no rashes, lesions, or ulcers of visualized skin   no nodules or areas of induration of palpated skin    Neuro CNs II-XII grossly intact   sensation to light touch grossly intact throughout    Psych appropriate affect   oriented to person, place, time

## 2024-05-19 MED FILL — TIVICAY 50 MG TABLET: ORAL | 30 days supply | Qty: 30 | Fill #4

## 2024-05-19 NOTE — Progress Notes (Unsigned)
 HISTORY OF PRESENT ILLNESS:      Samuel [name] is a 73 year old man with stage G4/A2 chronic kidney disease who is evaluated today for stage G4/A2 chronic kidney disease.      He continues to struggle with visual loss and has ongoing follow-up at the [facility].  Home blood pressures are averaging about 132/80 mmHg.  His recent labs show a normal serum potassium level.  He has not had increased leg edema, chest pain, flank pain, or visible hematuria.    MEDICATIONS:    Current Outpatient Medications   Medication Instructions    ACCU-CHEK GUIDE GLUCOSE METER Misc FOLLOW PACKAGE DIRECTIONS    ACCU-CHEK GUIDE L1-L2 CTRL SOL Soln     allopurinol  (ZYLOPRIM ) 250 mg, Daily (standard)    amlodipine  (NORVASC ) 10 mg, Oral, Daily (standard)    aspirin (ECOTRIN) 81 MG tablet Daily (standard)    atenolol  (TENORMIN ) 100 MG tablet 1 tablet, Daily (standard)    atorvastatin  (LIPITOR) 40 mg, Daily (standard)    atropine 1 % ophthalmic solution INSTILL 1 DROP IN THE RIGHT EYE FOUR TIMES DAILY    blood sugar diagnostic (ONETOUCH VERIO TEST STRIPS) Strp Use 2 (two) times daily E11.22    calcium  carbonate-vitamin D2 500 mg(1,250mg ) -200 unit tablet 1 tablet, 2 times a day (standard)    calcium -vitamin D 500 mg-5 mcg (200 unit) per tablet 1 tablet, Oral    carvedilol  (COREG ) 12.5 MG tablet     cholecalciferol , vitamin D3 25 mcg, 1,000 units,, 1,000 unit (25 mcg) tablet Daily (standard)    cloNIDine  HCL (CATAPRES ) 0.1 mg, Oral, Nightly    diazePAM  (VALIUM ) 5 mg, Nightly    dicyclomine (BENTYL) 20 mg tablet Take 1 tablet (20 mg total) by mouth two (2) times a day.    dorzolamide -timoloL  (COSOPT ) 22.3-6.8 mg/mL ophthalmic solution 1 drop, 2 times a day (standard)    erythromycin (ROMYCIN) 5 mg/gram (0.5 %) ophthalmic ointment APPLY THIN LAYER IN RIGHT EYE AT BEDTIME    ferrous sulfate 325 mg, Daily    folic acid (FOLVITE) 1 MG tablet No dose, route, or frequency recorded.    glimepiride  (AMARYL ) 2 mg, Daily before breakfast hydrALAZINE  (APRESOLINE ) 100 mg, Oral, 2 times a day (standard)    HYDROcodone-acetaminophen (NORCO) 5-325 mg per tablet 1 tablet, Daily PRN    lactulose 10 gram/15 mL solution TAKE 30 ML BY MOUTH DAILY    lisinopril  (PRINIVIL ,ZESTRIL ) 40 mg, Oral, Daily (standard)    nitroglycerin  (NITROSTAT ) 0.4 mg, Sublingual, Every 5 min PRN, Maximum of 3 doses in 15 minutes.    nortriptyline (PAMELOR) 10 MG capsule TAKE 1 CAPSULE BY MOUTH AT BEDTIME FOR 1 WEEK THEN TAKE 2 CAPSULES AT BEDTIME FOR HEADACHES    ofloxacin (OCUFLOX) 0.3 % ophthalmic solution 1 drop, 4 times a day    OZEMPIC 1 mg/dose (4 mg/3 mL) PnIj injection inject 1 mg subcutaneously once a week    peg-electrolyte soln (NULYTELY) 420 gram SolR MIX AND DRINK AS DIRECTED    PIFELTRO  100 mg, Oral, Daily (standard)    prednisoLONE acetate (PRED FORTE) 1 % ophthalmic suspension 1 drop, Left Eye, 4 times a day    pregabalin (LYRICA) 75 mg, 2 times a day (standard)    ROCKLATAN 0.02-0.005 % Drop INT 1 GTT INTO OU QHS    sertraline (ZOLOFT) 50 MG tablet take 1 tablet by mouth once daily    tamsulosin  (FLOMAX ) 0.4 mg capsule Take 1 capsule (0.4 mg total) by mouth before bedtime.  timolol  (TIMOPTIC ) 0.5 % ophthalmic solution INSTILL 1 DROP IN BOTH EYES EVERY MORNING    TIVICAY  50 mg, Oral, Daily (standard)    torsemide  (DEMADEX ) 20 MG tablet TAKE ONE AND ONE-HALF (1&1/2) TABLETS BY MOUTH DAILY           REVIEW OF SYSTEMS:  Constitutional: No constitutional symptoms.  No dysgeusia.  Cardiovascular: No chest pain.  Pulmonary: No cough or hemoptysis.  All other systems are reviewed and are negative except that noted in the history of present illness.    PHYSICAL EXAMINATION:      Constitutional: He is in no acute distress.  Vital signs:  HEENT: No oropharyngeal lesions.  Neck: Supple without lymphadenopathy or thyromegaly. No carotid bruits.   Lungs: Clear to auscultation   Heart: Regular rate and rhythm. Normal S1-S2.   Abdomen: Protuberant with increased tympany throughout.  Soft, nontender, normoactive bowel sounds.  Liver span is about 9 cm to percussion.  Neurologic: Alert and oriented x 3.  Strength is normal.  DTRs are 2+ and symmetric.  There are no lateralizing sensory deficits.  No asterixis.  Extremities: No pretibial edema.  Dorsalis pedis and posterior tibial pulses are palpable.    Node survey: No lymphadenopathy.   Musculoskeletal: No active synovitis.   Skin/Nails:  No rash or nail changes.      LABORATORY STUDIES:    Results for orders placed or performed in visit on 05/18/24   Albumin/creatinine urine ratio   Result Value Ref Range    Alb, Urine 76.0 mg/L    Creatinine, Ur 51.9 mg/dL    Albumin/Creatinine Ratio, Urine 146.4 mg/mmol   PTH   Result Value Ref Range    PTH 55.0 pg/mL   Ferritin   Result Value Ref Range    Ferritin 39.0 ng/mL   TSH   Result Value Ref Range    TSH 0.430 uIU/mL   PSA, Screening   Result Value Ref Range    PSA 0.42 ng/mL   Lipid Panel   Result Value Ref Range    Cholesterol, Total 182 mg/dL    Triglycerides 820 mg/dL    Cholesterol, HDL 35 mg/dL    Cholesterol, LDL, Calculated 111 mg/dL    Cholesterol, Non-HDL, Calculated      Chol/HDL Ratio     Hemoglobin A1c   Result Value Ref Range    Hemoglobin A1C 5.7 %    Estimated Average Glucose     Comprehensive Metabolic Panel   Result Value Ref Range    Sodium 142 mmol/L    Potassium 4.2 mmol/L    Chloride 104 mmol/L    CO2 28.3 mmol/L    BUN 22 mg/dL    Creatinine 7.39 mg/dL    Glucose 876 mg/dL    Calcium  9.6 mg/dL    Total Protein 7.2 g/dL    Total Bilirubin 1.1 mg/dL    AST 17 U/L    ALT 18 U/L    Alkaline Phosphatase 79 U/L    eGFR CKD-EPI (2021) Male      eGFR CKD-EPI (2021) Male      EGFR CKiD U25 SCR Male       EGFR CKID U25 SCR Male      eGFR 25.0 mg/dL    Albumin 4.1 g/dL     *Note: Due to a large number of results and/or encounters for the requested time period, some results have not been displayed. A complete set of results can be found in Results Review.  Serum creatinine trend:    Creatinine   Date Value Ref Range Status   05/09/2024 2.60 mg/dL Final   95/91/7974 7.22 (H) 0.73 - 1.18 mg/dL Final   87/96/7975 6.73 (H) 0.73 - 1.18 mg/dL Final   94/90/7975 6.62 (H) 0.76 - 1.27 mg/dL Final   95/69/7975 6.48 (H) 0.73 - 1.18 mg/dL Final   96/94/7975 6.99 mg/dL Final     Comment:     drawn @ [facility] - via [facility]   06/26/2022 2.27 (H) 0.73 - 1.18 mg/dL Final   89/87/7976 7.37 (H) 0.60 - 1.10 mg/dL Final   91/95/7977 7.37 (H) 0.76 - 1.27 mg/dL Final   93/89/7977 7.29 (H) 0.76 - 1.27 mg/dL Final   87/90/7978 7.46 (H) 0.76 - 1.27 mg/dL Final   89/73/7978 7.43 (H) 0.76 - 1.27 mg/dL Final   96/98/7978 7.69 (H) 0.76 - 1.27 mg/dL Final   87/84/7979 7.66 (H) 0.76 - 1.27 mg/dL Final       Imaging:    Brain MRI 10/13/2023    Multiple scattered foci of susceptibility in the cerebral   hemispheres which appear new since 2018. Distribution favors   cerebral amyloid angiopathy. Hypertensive encephalopathy is an   additional consideration.     Chronic microvascular ischemic changes. Multiple remote infarcts as   above. Mild parenchymal volume loss.     IMPRESSION AND PLAN:    1.  Stage G4/A2 chronic kidney disease.      Thankfully, his kidney function remains quite stable.  He continues to have about a 20% risk of progression to end-stage kidney disease at 5 years.  Home blood pressures are close to goal.    2. History of hyperkalemia.      His serum potassium level is acceptable.    3.  Hypertension.      He will continue on his current regimen of amlodipine  10 mg daily, carvedilol  12.5 mg twice daily, clonidine  0.1 mg nightly, lisinopril  40 mg daily, and hydralazine  100 mg twice daily.    4.  Human immunodeficiency virus disease.      His recent HIV indices have been relatively stable.  He has follow-up with Dr. Seward in January 2026.    5.  Secondary/tertiary hyperparathyroidism.      Serum calcium  levels acceptable.    6.  Type 2 diabetes mellitus.      Most recent hemoglobin A1c was excellent at 5.6%.        7.  Metabolic dysfunction associated with fatty liver disease.  Continue glimepiride .    8.  Peripheral neuropathy.      He will continue to see Dr. Sadie for this issue.    9.  Second degree Mobitz type I (Wenckebach) atrioventricular block.      He remains asymptomatic.    10. Glaucoma/legally blind.     11. Follow up plans. I will see him back again in about 4 months.      Elna LABOR. Pricilla, MD  Date of service 06/06/2024

## 2024-05-24 NOTE — Telephone Encounter (Signed)
 [name]  [address]  [place], [place] [address]  PHONE: [phone]  FAX: [phone]     CLINICAL PHARMACIST PRACTITIONER NOTE   INFECTIOUS DISEASES    Referring Infectious Diseases Specialist: [name], Samuel Schlossman, MD  Primary Care Provider: [name], MD    Reason for Office Visit: HIV Medication Therapy Management (MTM)    History of Present Illness: Samuel [name] is a 73 y.o. year old male living with HIV and multiple comorbidities who presents today for a MTM visit with the [name] and discuss various options for pill-pack pharmacies.    Allergies: Allergies[1]    PMH/PSH: Past Medical History[2]    HIV Care:  HIV Viral Load:   Lab Results   Component Value Date    HIVRS Detected (A) 05/04/2023    HIVRS Detected (A) 08/06/2022    HIVRS Detected (A) 03/12/2022    HIVRS Detected (A) 09/23/2021    HIVCP <20 (H) 05/04/2023    HIVCP <20 (H) 08/06/2022    HIVCP <20 (H) 03/12/2022    HIVCP <20 (H) 09/23/2021    HIV10  05/04/2023      Comment:      <1.30 log    HIV10  08/06/2022      Comment:      <1.30 log    HIV10  03/12/2022      Comment:      <1.30 log    HIV10  09/23/2021      Comment:      <1.30 log      CD4 Count:   Lab Results   Component Value Date    ACD4 782 05/04/2023    ACD4 770 08/06/2022    ACD4 612 09/23/2021    ACD4 720 02/26/2021     CD4%:   Lab Results   Component Value Date    CD4 34 05/04/2023    CD4 35 08/06/2022    CD4 34 09/23/2021    CD4 36 02/26/2021     Hepatitis C:   Lab Results   Component Value Date    HEPCAB Reactive (A) 01/29/2020      Hepatitis B:   Lab Results   Component Value Date    HBSAG Nonreactive 01/31/2020    HBSAG Nonreactive 01/29/2020    HBSAG Nonreactive 03/11/2016        Current ART:   CURRENT OUTPATIENT ADMINISTERED ANTIRETROVIRAL MEDICATIONS   Medication Sig Dispense Refill    dolutegravir  (TIVICAY ) 50 mg TABLET Take 1 tablet (50 mg total) by mouth daily. 90 tablet 3    doravirine  (PIFELTRO ) 100 mg Tab Take 1 tablet (100 mg total) by mouth daily.  30 tablet 11       Current Non-ART Medications: Current Medications[3]      Assessment and Plan:   #MTM:     05/24/2024  Patient's wife expressed concern over the nortriptyline where her husband started sleepwalking. Dr. Jewel instructed the patient to stop taking it. Called [name] and informed them of the discontinuation. They confirmed that they have discontinued the nortriptyline from the medbox moving forward. I called Samuel (Samuel [name]'s wife) back to inform her of this and she confirmed that she was able to remove the nortriptyline from his current box.     01/26/2024  Followed up with Samuel [name], (Samuel [name]'s wife) to inform her that [name]'s PCP reviewed the medication list and approved it. Per Samuel [name]'s request, initiated prescription transfer to [name] in [place] and informed them of the  patient's request to enroll within the [employer] Compliance Program. Pharmacy technician informed me that the patient should call [name] in about 2 hours to ensure the transfer from [name] was successful, provide delivery address, discuss any potential copays, and to discuss the [employer] Compliance Program. Per the patient's request, all of the oral medications were transferred and the Ozempic. The ophthalmic medications remain at [name] in [name] and the ART (DOR + DTG) remain with [name].     01/05/2024  Followed up with patient and his spouse today regarding transferring his medications to a pill-pack pharmacy. I informed them that I am still looking to hear back from his PCP to confirm that the medication list is accurate prior to transfer. Patient has a follow up appointment with his PCP on 8/25 and will remind them to follow up with me. I will reach out to PCP again.     12/13/2023  Patient and his spouse presented to the appointment for a medication management/reconciliation visit. I proceeded to take inventory of his medications and document in the [name]. I reviewed the medications that the patient bought with him to his appointment and updated the list in EPIC accordingly. For medications that the patient did not bring to the clinic visit, I noted that below as well.   Allopurinol  100 mg tablet  Take two and one-half tablets (250 mg) by mouth daily   Amlodipine  10 mg tablet  Take one tablet by mouth daily  Patient didn't bring physical bottle as he forgot it on the counter at home. He did verify name, dose, indication, and route   Aspirin 81 mg tablet  Take one tablet by mouth daily  Patient didn't bring physical bottle as he forgot it at home  Atorvastatin  40 mg tablet  Take one tablet by mouth daily  Carvedilol  12.5 mg tablet  Take one tablet by mouth twice daily  Cholecalciferol  (vitamin D3 25 mcg)  Patient reports taking one tablet by mouth weekly  Clonidine  0.1 mg tablet  Take one tablet by mouth nightly  Dicyclomine 20 mg tablet  Take one tablet by mouth twice daily   Dolutegravir  50 mg tablet  Take one tablet by mouth daily  Doravirine  100 mg tablet  Take one tablet by mouth daily  Glimepiride  4 mg tablet  Take one tablet by mouth daily with breakfast  Hydralazine  100 mg tablet  Take one tablet by mouth twice daily  Patient didn't bring physical bottle, and was unsure if he is taking this medication or not   This medication doesn't appear on his dispense history   Lisinopril  40 mg tablet   Take one tablet by mouth daily  Nitroglycerin  0.4 mg SL tablet  Place one tablet under the tongue every five minutes as needed for chest pain. Max 3 doses in fifteen minutes  Ozempic (Semaglutide) 1mg   Inject 1 mg under the skin every seven days  Sertraline 50 mg tablet  Take one tablet by mouth daily.   Patient stated that he has difficulty sleeping at night. I suggested taking the sertraline during the day to see if this helps  Tamsulosin  0.4 mg capsule  Take one capsule by mouth at daily  Patient reports taking this at bedtime  Torsemide  10 mg tablet  Take two tablets by mouth daily    Updated medication list in EPIC and indicated which medications the patient is no longer taking.   Patient stated having the following concerns:  Patient requests a new glucose monitor as the one he has no longer works  Patient is also requesting a blood pressure monitor with a larger cuff. Informed him that I will take a look into it.   Patient is taking a One-a-Day multivitamin and is also taking a separate vitamin B-12 (500 mcg) once daily. Patient would like to know if he needs the additional B-12 supplementation or is the MVT alone sufficient.     Pill-Pack/Blister-pack pharmacies   [facility] in [place]   The pharmacy offers this service as a courtesy to patients and charges $4 per pack per month. The overall cost depends on how many packs are dispensed. The overall cost for Mr. Tesch is estimated to be around $8 to $16 monthly. The pharmacy is able to deliver medications as well. Since this pharmacy is in [place], the patient's spouse will most likely pick up the medications directly.   [facility] in [place] that offers blister packs to patients for no additional cost. Pharmacy is also able to deliver.  [name] in [place]  Patients are eligible to enroll in the [employer] Compliance Program for no additional cost to the patient.   Pharmacy can also deliver to patient for no additional costs.  PLAN:  Share updated medication list with the patient's providers to ensure that the list is accurate and nothing is missing.   Once list has been confirmed by the providers, [name] will initiate transfer of prescriptions to patient's pharmacy of choice and ensure blister packaging.   [name] will follow up with patient afterwards to see how patient is doing with the blister packs.     Importance of informing pharmacy and clinic of updated contact information. Stressed importance of being able to reach over the phone to set up refills. Advised patient to call when down to about 14 day supply left to ensure there's no interruption in therapy.    Patient verbalized understanding of counseling. Provided contact information for any further questions/concerns.       #Medication Adherence and Access:  Since last visit, patient denies missing doses of any of his medications.  Is the pharmacy on file the one you are currently using to fill your medication(s) through [name] pharmacy with patient)? [id number]  [facility] for [place]  and Pifeltro   Do you ever forget to take your medications? Unsure; He thinks he may have missed some of his medications.   Do any of your medications make you sick? No  Are you experiencing any barriers to care like: transportation to medical appointments, stable housing, lack of food, etc.? Yes; Visual impairment. Spouse drives him to visits.    Follow-up:  Next [name] follow up TBD  Next ID visit TBD with [name], Samuel Schlossman, MD    I spent a total of 5 minutes on the phone with the patient delivering clinical care and providing education/counseling.       Camille Evans, PharmD, BCIDP, [name], AAHIVP  Clinical Pharmacist Practitioner - HIV/Infectious Diseases    [facility]  [address]  [place], [place]  [address]  P [phone]  F [phone]        [1]   Allergies  Allergen Reactions    Colchicine Analogues Diarrhea     Have diarrhea when taken for long periods of time    Tramadol Other (See Comments)     unknown tolerates morphine   [2]   Past Medical  History:  Diagnosis Date    Abnormal EKG 11/09/2015    Acute encephalopathy 11/30/2016    Anemia in stage 3 chronic kidney disease (CMS-HCC) 01/19/2015    Last Assessment & Plan: Formatting of this note might be different from the original. # Severe iron deficiency anemia/hemoglobin- 6/ ferritin 5 [march 2017]. Status post IV iron- significant improvement of hemoglobin. Last IV Feraheme was 08/28/2015 where he received 510 mg. #Was seen by Dr. Rennie in February 2019 with complaints of worsening significant pica/fatigue/cold intolerance.  Started    Bradycardia 11/09/2015    Chronic hepatitis C without hepatic coma    (CMS-HCC) 01/19/2015    CKD (chronic kidney disease) stage 3, GFR 30-59 ml/min (CMS-HCC) 01/08/2015    Coronary artery disease 10/2015    Multivessel disease, plan medical management after cath 6/17    GERD (gastroesophageal reflux disease)     Gout     HCV (hepatitis C virus) 11/22/2012    (genotype 1, HCV RNA =311,000; liver biopsy 05/2005 =chronic hepatitis grade II stage II).  Has consistently and repeatedly refused therapy.       Hepatitis B core antibody positive 11/18/2023    HIV (human immunodeficiency virus infection)    (CMS-HCC)     Undectable viral load 5/17    Hypertension     Nephrolithiasis     Nephrotic syndrome 03/05/2014    Neurological deficit present 11/13/2015    Weakness in lower extremities    Personal history of gout 10/27/2018    Renal mass     Followed by neprhology, MRI 8/16 improved, felt to be benign   [3]   Current Outpatient Medications:     ACCU-CHEK GUIDE GLUCOSE METER Misc, FOLLOW PACKAGE DIRECTIONS, Disp: , Rfl:     ACCU-CHEK GUIDE L1-L2 CTRL SOL Soln, , Disp: , Rfl:     allopurinoL  (ZYLOPRIM ) 100 MG tablet, Take 2.5 tablets (250 mg total) by mouth daily., Disp: , Rfl:     amlodipine  (NORVASC ) 10 MG tablet, Take 1 tablet (10 mg total) by mouth daily., Disp: 90 tablet, Rfl: 3    aspirin (ECOTRIN) 81 MG tablet, daily. , Disp: , Rfl: 0    atenolol  (TENORMIN ) 100 MG tablet, Take 1 tablet (100 mg total) by mouth daily., Disp: , Rfl:     atorvastatin  (LIPITOR) 40 MG tablet, Take 1 tablet (40 mg total) by mouth daily., Disp: , Rfl:     atropine 1 % ophthalmic solution, INSTILL 1 DROP IN THE RIGHT EYE FOUR TIMES DAILY, Disp: , Rfl:     blood sugar diagnostic (ONETOUCH VERIO TEST STRIPS) Strp, Use 2 (two) times daily E11.22, Disp: , Rfl:     calcium  carbonate-vitamin D2 500 mg(1,250mg ) -200 unit tablet, Take 1 tablet (500 mg total) by mouth two (2) times a day. (Patient not taking: Reported on 12/13/2023), Disp: , Rfl:     calcium -vitamin D 500 mg-5 mcg (200 unit) per tablet, Take 1 tablet by mouth., Disp: , Rfl:     carvedilol  (COREG ) 12.5 MG tablet, , Disp: , Rfl:     cholecalciferol , vitamin D3 25 mcg, 1,000 units,, 1,000 unit (25 mcg) tablet, Take by mouth daily. (Patient taking differently: Take 1 tablet (25 mcg total) by mouth once a week.), Disp: , Rfl:     cloNIDine  HCL (CATAPRES ) 0.1 MG tablet, Take 1 tablet (0.1 mg total) by mouth nightly., Disp: , Rfl:     diazepam  (VALIUM ) 5 MG tablet, Take 1 tablet (5 mg total) by mouth nightly., Disp: ,  Rfl:     dicyclomine (BENTYL) 20 mg tablet, Take 1 tablet (20 mg total) by mouth two (2) times a day., Disp: , Rfl:     dolutegravir  (TIVICAY ) 50 mg TABLET, Take 1 tablet (50 mg total) by mouth daily., Disp: 90 tablet, Rfl: 3    doravirine  (PIFELTRO ) 100 mg Tab, Take 1 tablet (100 mg total) by mouth daily., Disp: 30 tablet, Rfl: 1    dorzolamide -timoloL  (COSOPT ) 22.3-6.8 mg/mL ophthalmic solution, Administer 1 drop to both eyes two (2) times a day., Disp: , Rfl:     erythromycin (ROMYCIN) 5 mg/gram (0.5 %) ophthalmic ointment, APPLY THIN LAYER IN RIGHT EYE AT BEDTIME, Disp: , Rfl:     ferrous sulfate 325 (65 FE) MG tablet, Take 1 tablet (325 mg total) by mouth in the morning. (Patient not taking: Reported on 12/13/2023), Disp: , Rfl:     folic acid (FOLVITE) 1 MG tablet, , Disp: , Rfl: 0    glimepiride  (AMARYL ) 2 MG tablet, Take 1 tablet (2 mg total) by mouth daily before breakfast. TAKE 1 TABLET BY MOUTH DAILY WITH BREAKFAST., Disp: , Rfl:     hydrALAZINE  (APRESOLINE ) 100 MG tablet, Take 1 tablet (100 mg total) by mouth two (2) times a day., Disp: , Rfl:     HYDROcodone-acetaminophen (NORCO) 5-325 mg per tablet, Take 1 tablet by mouth daily as needed for pain., Disp: , Rfl:     lactulose 10 gram/15 mL solution, TAKE 30 ML BY MOUTH DAILY, Disp: , Rfl:     lisinopril  (PRINIVIL ,ZESTRIL ) 40 MG tablet, Take 1 tablet (40 mg total) by mouth daily., Disp: 90 tablet, Rfl: 3    nitroglycerin  (NITROSTAT ) 0.4 MG SL tablet, Place 1 tablet (0.4 mg total) under the tongue every five (5) minutes as needed for chest pain. Maximum of 3 doses in 15 minutes., Disp: 25 tablet, Rfl: 0    nortriptyline (PAMELOR) 10 MG capsule, TAKE 1 CAPSULE BY MOUTH AT BEDTIME FOR 1 WEEK THEN TAKE 2 CAPSULES AT BEDTIME FOR HEADACHES, Disp: , Rfl:     ofloxacin (OCUFLOX) 0.3 % ophthalmic solution, Administer 1 drop into the left eye four (4) times a day., Disp: , Rfl:     OZEMPIC 1 mg/dose (4 mg/3 mL) PnIj injection, inject 1 mg subcutaneously once a week, Disp: , Rfl:     peg-electrolyte soln (NULYTELY) 420 gram SolR, MIX AND DRINK AS DIRECTED, Disp: , Rfl:     prednisoLONE acetate (PRED FORTE) 1 % ophthalmic suspension, Administer 1 drop into the left eye four (4) times a day., Disp: , Rfl:     pregabalin (LYRICA) 75 MG capsule, Take 1 capsule (75 mg total) by mouth two (2) times a day., Disp: , Rfl:     ROCKLATAN 0.02-0.005 % Drop, INT 1 GTT INTO OU QHS, Disp: , Rfl:     sertraline (ZOLOFT) 50 MG tablet, take 1 tablet by mouth once daily, Disp: , Rfl:     tamsulosin  (FLOMAX ) 0.4 mg capsule, Take 1 capsule (0.4 mg total) by mouth before bedtime., Disp: , Rfl:     timolol  (TIMOPTIC ) 0.5 % ophthalmic solution, INSTILL 1 DROP IN BOTH EYES EVERY MORNING, Disp: , Rfl:     torsemide  (DEMADEX ) 20 MG tablet, TAKE ONE AND ONE-HALF (1&1/2) TABLETS BY MOUTH DAILY, Disp: , Rfl:

## 2024-06-05 ENCOUNTER — Ambulatory Visit: Admit: 2024-06-05 | Payer: Medicare (Managed Care) | Attending: Nephrology | Primary: Nephrology

## 2024-06-16 DIAGNOSIS — B20 Human immunodeficiency virus [HIV] disease: Principal | ICD-10-CM

## 2024-06-16 NOTE — Progress Notes (Signed)
 [facility] Refill Coordination Note    Specialty Medication(s) to be Shipped:   Infectious Disease: Pifeltro  and Tivicay     Other medication(s) to be shipped: No additional medications requested for fill at this time    Specialty Medications not needed at this time: N/A     [facility]uel [name], DOB: 1951-03-24  Phone: There are no phone numbers on file.      All above HIPAA information was verified with patient. And his wife     Was a nurse, learning disability used for this call? No    Completed refill call assessment today to schedule patient's medication shipment from the [facility]  [phone]).  All relevant notes have been reviewed.     Specialty medication(s) and dose(s) confirmed: Regimen is correct and unchanged.   Changes to medications: Kein reports no changes at this time.  Changes to insurance: No  New side effects reported not previously addressed with a pharmacist or physician: None reported  Questions for the pharmacist: No    Confirmed patient received a Conservation Officer, Historic Buildings and a Surveyor, Mining with first shipment. The patient will receive a drug information handout for each medication shipped and additional FDA Medication Guides as required.       DISEASE/MEDICATION-SPECIFIC INFORMATION        N/A    SPECIALTY MEDICATION ADHERENCE     Medication Adherence    Patient reported X missed doses in the last month: 0  Specialty Medication: PIFELTRO  100 mg Tab (doravirine )  Patient is on additional specialty medications: Yes  Additional Specialty Medications: TIVICAY  50 mg TABLET (dolutegravir )  Patient Reported Additional Medication X Missed Doses in the Last Month: 0  Patient is on more than two specialty medications: No  Informant: patient              Were doses missed due to medication being on hold? No    Tivicay  50 mg: ~5 days of medicine on hand   Pifeltro  100 mg: ~5 days of medicine on hand       Specialty medication is an injection or given on a cycle: No    REFERRAL TO PHARMACIST     Referral to the pharmacist: Not needed      [facility]     Shipping address confirmed in Epic.     Cost and Payment: Unable to determine copay at this time as the prescription requires a prior authorization/financial assistance. Patient is aware that shipment will be held until copay has been approved and payment information collected, if needed.    Delivery Scheduled: Yes, Expected medication delivery date: 06/22/24.     Medication will be delivered via Next Day Courier to the prescription address in Epic WAM.    Aleck CHRISTELLA Gaskins, PharmD   [facility]  Specialty Pharmacist

## 2024-06-20 ENCOUNTER — Other Ambulatory Visit: Payer: Self-pay | Admitting: Internal Medicine

## 2024-06-20 DIAGNOSIS — R413 Other amnesia: Secondary | ICD-10-CM

## 2024-06-20 DIAGNOSIS — R519 Headache, unspecified: Secondary | ICD-10-CM

## 2024-06-21 MED FILL — TIVICAY 50 MG TABLET: ORAL | 30 days supply | Qty: 30 | Fill #5

## 2024-06-21 MED FILL — PIFELTRO 100 MG TABLET: ORAL | 30 days supply | Qty: 30 | Fill #1

## 2024-06-29 ENCOUNTER — Ambulatory Visit
Admission: RE | Admit: 2024-06-29 | Discharge: 2024-06-29 | Disposition: A | Discharge status: Home / Self Care | Source: Ambulatory Visit | Attending: Internal Medicine | Admitting: Internal Medicine

## 2024-06-29 DIAGNOSIS — R413 Other amnesia: Secondary | ICD-10-CM | POA: Diagnosis present

## 2024-06-29 DIAGNOSIS — R519 Headache, unspecified: Secondary | ICD-10-CM | POA: Diagnosis present
